# Patient Record
Sex: Male | Born: 1960 | Race: Black or African American | Hispanic: No | Smoking: Former smoker
Health system: Southern US, Community
[De-identification: ages and names within clinical notes are randomized; demographics above are authoritative.]

## PROBLEM LIST (undated history)

## (undated) DIAGNOSIS — Z950 Presence of cardiac pacemaker: Secondary | ICD-10-CM

## (undated) DIAGNOSIS — E785 Hyperlipidemia, unspecified: Secondary | ICD-10-CM

## (undated) DIAGNOSIS — I219 Acute myocardial infarction, unspecified: Secondary | ICD-10-CM

## (undated) DIAGNOSIS — F329 Major depressive disorder, single episode, unspecified: Secondary | ICD-10-CM

## (undated) DIAGNOSIS — I4891 Unspecified atrial fibrillation: Secondary | ICD-10-CM

## (undated) DIAGNOSIS — H269 Unspecified cataract: Secondary | ICD-10-CM

## (undated) DIAGNOSIS — K635 Polyp of colon: Secondary | ICD-10-CM

## (undated) DIAGNOSIS — K219 Gastro-esophageal reflux disease without esophagitis: Secondary | ICD-10-CM

## (undated) DIAGNOSIS — I5042 Chronic combined systolic (congestive) and diastolic (congestive) heart failure: Secondary | ICD-10-CM

## (undated) DIAGNOSIS — F32A Depression, unspecified: Secondary | ICD-10-CM

## (undated) DIAGNOSIS — I10 Essential (primary) hypertension: Secondary | ICD-10-CM

## (undated) DIAGNOSIS — E669 Obesity, unspecified: Secondary | ICD-10-CM

## (undated) DIAGNOSIS — D132 Benign neoplasm of duodenum: Secondary | ICD-10-CM

## (undated) DIAGNOSIS — N529 Male erectile dysfunction, unspecified: Secondary | ICD-10-CM

## (undated) DIAGNOSIS — IMO0002 Reserved for concepts with insufficient information to code with codable children: Secondary | ICD-10-CM

## (undated) DIAGNOSIS — I495 Sick sinus syndrome: Secondary | ICD-10-CM

## (undated) DIAGNOSIS — I2510 Atherosclerotic heart disease of native coronary artery without angina pectoris: Secondary | ICD-10-CM

## (undated) DIAGNOSIS — K279 Peptic ulcer, site unspecified, unspecified as acute or chronic, without hemorrhage or perforation: Secondary | ICD-10-CM

## (undated) DIAGNOSIS — K859 Acute pancreatitis without necrosis or infection, unspecified: Secondary | ICD-10-CM

## (undated) DIAGNOSIS — K449 Diaphragmatic hernia without obstruction or gangrene: Secondary | ICD-10-CM

## (undated) DIAGNOSIS — T8859XA Other complications of anesthesia, initial encounter: Secondary | ICD-10-CM

## (undated) DIAGNOSIS — F1910 Other psychoactive substance abuse, uncomplicated: Secondary | ICD-10-CM

## (undated) DIAGNOSIS — K625 Hemorrhage of anus and rectum: Secondary | ICD-10-CM

## (undated) DIAGNOSIS — G4730 Sleep apnea, unspecified: Secondary | ICD-10-CM

## (undated) DIAGNOSIS — E0590 Thyrotoxicosis, unspecified without thyrotoxic crisis or storm: Secondary | ICD-10-CM

## (undated) DIAGNOSIS — R51 Headache: Secondary | ICD-10-CM

## (undated) DIAGNOSIS — K000 Anodontia: Secondary | ICD-10-CM

## (undated) DIAGNOSIS — I2699 Other pulmonary embolism without acute cor pulmonale: Secondary | ICD-10-CM

## (undated) HISTORY — DX: Benign neoplasm of duodenum: D13.2

## (undated) HISTORY — DX: Male erectile dysfunction, unspecified: N52.9

## (undated) HISTORY — DX: Polyp of colon: K63.5

## (undated) HISTORY — DX: Acute pancreatitis without necrosis or infection, unspecified: K85.90

## (undated) HISTORY — DX: Unspecified atrial fibrillation: I48.91

## (undated) HISTORY — DX: Other pulmonary embolism without acute cor pulmonale: I26.99

## (undated) HISTORY — DX: Atherosclerotic heart disease of native coronary artery without angina pectoris: I25.10

## (undated) HISTORY — DX: Other psychoactive substance abuse, uncomplicated: F19.10

## (undated) HISTORY — DX: Unspecified cataract: H26.9

## (undated) HISTORY — DX: Diaphragmatic hernia without obstruction or gangrene: K44.9

## (undated) HISTORY — DX: Reserved for concepts with insufficient information to code with codable children: IMO0002

## (undated) HISTORY — DX: Obesity, unspecified: E66.9

## (undated) HISTORY — DX: Chronic combined systolic (congestive) and diastolic (congestive) heart failure: I50.42

## (undated) HISTORY — DX: Peptic ulcer, site unspecified, unspecified as acute or chronic, without hemorrhage or perforation: K27.9

## (undated) HISTORY — PX: CARDIAC CATHETERIZATION: SHX172

## (undated) HISTORY — DX: Thyrotoxicosis, unspecified without thyrotoxic crisis or storm: E05.90

## (undated) HISTORY — DX: Depression, unspecified: F32.A

## (undated) HISTORY — DX: Sick sinus syndrome: I49.5

## (undated) HISTORY — DX: Sleep apnea, unspecified: G47.30

## (undated) HISTORY — PX: INSERT / REPLACE / REMOVE PACEMAKER: SUR710

## (undated) HISTORY — DX: Major depressive disorder, single episode, unspecified: F32.9

## (undated) HISTORY — DX: Hyperlipidemia, unspecified: E78.5

## (undated) HISTORY — DX: Essential (primary) hypertension: I10

## (undated) HISTORY — DX: Hemorrhage of anus and rectum: K62.5

## (undated) HISTORY — DX: Gastro-esophageal reflux disease without esophagitis: K21.9

## (undated) HISTORY — PX: BACK SURGERY: SHX140

---

## 1982-10-01 HISTORY — PX: OTHER SURGICAL HISTORY: SHX169

## 1998-05-07 ENCOUNTER — Emergency Department (HOSPITAL_COMMUNITY): Admission: EM | Admit: 1998-05-07 | Discharge: 1998-05-07 | Payer: Self-pay | Admitting: Emergency Medicine

## 1998-05-08 ENCOUNTER — Emergency Department (HOSPITAL_COMMUNITY): Admission: EM | Admit: 1998-05-08 | Discharge: 1998-05-08 | Payer: Self-pay | Admitting: Emergency Medicine

## 1999-07-06 ENCOUNTER — Emergency Department (HOSPITAL_COMMUNITY): Admission: EM | Admit: 1999-07-06 | Discharge: 1999-07-06 | Payer: Self-pay | Admitting: Emergency Medicine

## 1999-07-10 ENCOUNTER — Emergency Department (HOSPITAL_COMMUNITY): Admission: EM | Admit: 1999-07-10 | Discharge: 1999-07-10 | Payer: Self-pay | Admitting: Emergency Medicine

## 1999-07-28 ENCOUNTER — Encounter: Payer: Self-pay | Admitting: Emergency Medicine

## 1999-07-28 ENCOUNTER — Emergency Department (HOSPITAL_COMMUNITY): Admission: EM | Admit: 1999-07-28 | Discharge: 1999-07-28 | Payer: Self-pay | Admitting: Emergency Medicine

## 2001-01-22 ENCOUNTER — Emergency Department (HOSPITAL_COMMUNITY): Admission: EM | Admit: 2001-01-22 | Discharge: 2001-01-22 | Payer: Self-pay | Admitting: Emergency Medicine

## 2001-04-11 ENCOUNTER — Emergency Department (HOSPITAL_COMMUNITY): Admission: EM | Admit: 2001-04-11 | Discharge: 2001-04-11 | Payer: Self-pay | Admitting: Emergency Medicine

## 2002-07-20 ENCOUNTER — Emergency Department (HOSPITAL_COMMUNITY): Admission: EM | Admit: 2002-07-20 | Discharge: 2002-07-20 | Payer: Self-pay | Admitting: Emergency Medicine

## 2002-08-07 ENCOUNTER — Encounter: Payer: Self-pay | Admitting: Emergency Medicine

## 2002-08-07 ENCOUNTER — Emergency Department (HOSPITAL_COMMUNITY): Admission: EM | Admit: 2002-08-07 | Discharge: 2002-08-07 | Payer: Self-pay | Admitting: Emergency Medicine

## 2003-04-01 DIAGNOSIS — I2699 Other pulmonary embolism without acute cor pulmonale: Secondary | ICD-10-CM

## 2003-04-01 HISTORY — DX: Other pulmonary embolism without acute cor pulmonale: I26.99

## 2003-04-18 ENCOUNTER — Inpatient Hospital Stay (HOSPITAL_COMMUNITY): Admission: EM | Admit: 2003-04-18 | Discharge: 2003-04-26 | Payer: Self-pay | Admitting: *Deleted

## 2003-04-18 ENCOUNTER — Encounter: Payer: Self-pay | Admitting: *Deleted

## 2003-04-18 ENCOUNTER — Encounter: Payer: Self-pay | Admitting: Emergency Medicine

## 2003-04-20 ENCOUNTER — Encounter: Payer: Self-pay | Admitting: *Deleted

## 2003-04-21 ENCOUNTER — Encounter: Payer: Self-pay | Admitting: *Deleted

## 2003-05-10 ENCOUNTER — Emergency Department (HOSPITAL_COMMUNITY): Admission: EM | Admit: 2003-05-10 | Discharge: 2003-05-10 | Payer: Self-pay | Admitting: Emergency Medicine

## 2003-12-09 ENCOUNTER — Emergency Department (HOSPITAL_COMMUNITY): Admission: EM | Admit: 2003-12-09 | Discharge: 2003-12-09 | Payer: Self-pay | Admitting: Emergency Medicine

## 2003-12-12 ENCOUNTER — Emergency Department (HOSPITAL_COMMUNITY): Admission: EM | Admit: 2003-12-12 | Discharge: 2003-12-12 | Payer: Self-pay | Admitting: Family Medicine

## 2003-12-15 ENCOUNTER — Emergency Department (HOSPITAL_COMMUNITY): Admission: AD | Admit: 2003-12-15 | Discharge: 2003-12-15 | Payer: Self-pay | Admitting: Internal Medicine

## 2004-01-10 ENCOUNTER — Emergency Department (HOSPITAL_COMMUNITY): Admission: EM | Admit: 2004-01-10 | Discharge: 2004-01-10 | Payer: Self-pay | Admitting: Emergency Medicine

## 2004-02-03 ENCOUNTER — Emergency Department (HOSPITAL_COMMUNITY): Admission: EM | Admit: 2004-02-03 | Discharge: 2004-02-03 | Payer: Self-pay | Admitting: Emergency Medicine

## 2004-02-08 ENCOUNTER — Inpatient Hospital Stay (HOSPITAL_BASED_OUTPATIENT_CLINIC_OR_DEPARTMENT_OTHER): Admission: RE | Admit: 2004-02-08 | Discharge: 2004-02-08 | Payer: Self-pay | Admitting: Cardiology

## 2004-08-23 ENCOUNTER — Emergency Department (HOSPITAL_COMMUNITY): Admission: EM | Admit: 2004-08-23 | Discharge: 2004-08-23 | Payer: Self-pay | Admitting: Family Medicine

## 2004-09-17 ENCOUNTER — Emergency Department (HOSPITAL_COMMUNITY): Admission: EM | Admit: 2004-09-17 | Discharge: 2004-09-17 | Payer: Self-pay | Admitting: Family Medicine

## 2004-10-23 ENCOUNTER — Emergency Department (HOSPITAL_COMMUNITY): Admission: EM | Admit: 2004-10-23 | Discharge: 2004-10-23 | Payer: Self-pay | Admitting: Family Medicine

## 2004-11-04 ENCOUNTER — Emergency Department (HOSPITAL_COMMUNITY): Admission: EM | Admit: 2004-11-04 | Discharge: 2004-11-04 | Payer: Self-pay | Admitting: Emergency Medicine

## 2004-11-04 ENCOUNTER — Emergency Department (HOSPITAL_COMMUNITY): Admission: EM | Admit: 2004-11-04 | Discharge: 2004-11-04 | Payer: Self-pay | Admitting: *Deleted

## 2005-04-24 ENCOUNTER — Emergency Department (HOSPITAL_COMMUNITY): Admission: EM | Admit: 2005-04-24 | Discharge: 2005-04-24 | Payer: Self-pay | Admitting: Emergency Medicine

## 2005-04-27 ENCOUNTER — Emergency Department (HOSPITAL_COMMUNITY): Admission: EM | Admit: 2005-04-27 | Discharge: 2005-04-27 | Payer: Self-pay | Admitting: Emergency Medicine

## 2005-06-21 ENCOUNTER — Emergency Department (HOSPITAL_COMMUNITY): Admission: EM | Admit: 2005-06-21 | Discharge: 2005-06-21 | Payer: Self-pay | Admitting: Emergency Medicine

## 2005-07-19 ENCOUNTER — Emergency Department (HOSPITAL_COMMUNITY): Admission: EM | Admit: 2005-07-19 | Discharge: 2005-07-19 | Payer: Self-pay | Admitting: Emergency Medicine

## 2005-08-05 ENCOUNTER — Ambulatory Visit: Payer: Self-pay | Admitting: Internal Medicine

## 2006-10-27 ENCOUNTER — Emergency Department (HOSPITAL_COMMUNITY): Admission: EM | Admit: 2006-10-27 | Discharge: 2006-10-27 | Payer: Self-pay | Admitting: Emergency Medicine

## 2007-07-25 ENCOUNTER — Ambulatory Visit: Payer: Self-pay | Admitting: Internal Medicine

## 2007-08-30 ENCOUNTER — Ambulatory Visit: Payer: Self-pay | Admitting: Gastroenterology

## 2007-08-30 ENCOUNTER — Ambulatory Visit: Payer: Self-pay | Admitting: Internal Medicine

## 2007-08-30 DIAGNOSIS — I480 Paroxysmal atrial fibrillation: Secondary | ICD-10-CM | POA: Insufficient documentation

## 2007-08-30 DIAGNOSIS — E785 Hyperlipidemia, unspecified: Secondary | ICD-10-CM | POA: Insufficient documentation

## 2007-08-30 DIAGNOSIS — K921 Melena: Secondary | ICD-10-CM | POA: Insufficient documentation

## 2007-08-30 DIAGNOSIS — F329 Major depressive disorder, single episode, unspecified: Secondary | ICD-10-CM

## 2007-08-30 DIAGNOSIS — K219 Gastro-esophageal reflux disease without esophagitis: Secondary | ICD-10-CM | POA: Insufficient documentation

## 2007-08-30 DIAGNOSIS — Z9189 Other specified personal risk factors, not elsewhere classified: Secondary | ICD-10-CM | POA: Insufficient documentation

## 2007-08-30 DIAGNOSIS — Z86718 Personal history of other venous thrombosis and embolism: Secondary | ICD-10-CM | POA: Insufficient documentation

## 2007-08-30 DIAGNOSIS — F1010 Alcohol abuse, uncomplicated: Secondary | ICD-10-CM | POA: Insufficient documentation

## 2007-08-30 DIAGNOSIS — F3289 Other specified depressive episodes: Secondary | ICD-10-CM | POA: Insufficient documentation

## 2007-08-30 DIAGNOSIS — E0590 Thyrotoxicosis, unspecified without thyrotoxic crisis or storm: Secondary | ICD-10-CM | POA: Insufficient documentation

## 2007-08-30 LAB — CONVERTED CEMR LAB
ALT: 32 units/L (ref 0–53)
Albumin: 3.8 g/dL (ref 3.5–5.2)
Basophils Absolute: 0 10*3/uL (ref 0.0–0.1)
Calcium: 9.2 mg/dL (ref 8.4–10.5)
Creatinine, Ser: 1.1 mg/dL (ref 0.4–1.5)
Eosinophils Relative: 3.3 % (ref 0.0–5.0)
Ferritin: 348.8 ng/mL — ABNORMAL HIGH (ref 22.0–322.0)
Folate: 6.9 ng/mL
GFR calc non Af Amer: 76 mL/min
Glucose, Bld: 97 mg/dL (ref 70–99)
Hemoglobin: 14.8 g/dL (ref 13.0–17.0)
Iron: 78 ug/dL (ref 42–165)
Lymphocytes Relative: 39.8 % (ref 12.0–46.0)
MCV: 79.9 fL (ref 78.0–100.0)
Monocytes Relative: 10.1 % (ref 3.0–11.0)
Neutro Abs: 2.8 10*3/uL (ref 1.4–7.7)
PSA: 0.23 ng/mL (ref 0.10–4.00)
Platelets: 237 10*3/uL (ref 150–400)
Potassium: 3.9 meq/L (ref 3.5–5.1)
RBC: 5.39 M/uL (ref 4.22–5.81)
Saturation Ratios: 21.4 % (ref 20.0–50.0)
Total Bilirubin: 0.7 mg/dL (ref 0.3–1.2)
Transferrin: 259.9 mg/dL (ref >=212.0)
Triglycerides: 61 mg/dL (ref 0–149)
VLDL: 12 mg/dL (ref 0–40)
WBC: 6 10*3/uL (ref 4.5–10.5)

## 2007-09-12 ENCOUNTER — Ambulatory Visit: Payer: Self-pay | Admitting: Gastroenterology

## 2007-09-29 ENCOUNTER — Emergency Department (HOSPITAL_COMMUNITY): Admission: EM | Admit: 2007-09-29 | Discharge: 2007-09-29 | Payer: Self-pay | Admitting: Family Medicine

## 2007-10-12 ENCOUNTER — Encounter: Payer: Self-pay | Admitting: Endocrinology

## 2007-11-02 ENCOUNTER — Encounter: Payer: Self-pay | Admitting: Gastroenterology

## 2007-11-02 ENCOUNTER — Ambulatory Visit: Payer: Self-pay | Admitting: Gastroenterology

## 2007-11-02 ENCOUNTER — Encounter: Payer: Self-pay | Admitting: Internal Medicine

## 2008-01-24 ENCOUNTER — Emergency Department (HOSPITAL_COMMUNITY): Admission: EM | Admit: 2008-01-24 | Discharge: 2008-01-24 | Payer: Self-pay | Admitting: Emergency Medicine

## 2008-02-08 ENCOUNTER — Ambulatory Visit: Payer: Self-pay | Admitting: Cardiology

## 2008-02-08 LAB — CONVERTED CEMR LAB
HDL: 44.6 mg/dL (ref >39.0)
TSH: 0.32 microintl units/mL — ABNORMAL LOW (ref 0.35–5.50)
Total CK: 237 units/L (ref 7–195)
Triglycerides: 71 mg/dL (ref 0–149)

## 2008-02-09 ENCOUNTER — Encounter: Payer: Self-pay | Admitting: Internal Medicine

## 2008-02-09 ENCOUNTER — Ambulatory Visit: Payer: Self-pay

## 2008-02-21 ENCOUNTER — Ambulatory Visit: Payer: Self-pay | Admitting: Internal Medicine

## 2008-03-21 ENCOUNTER — Ambulatory Visit: Payer: Self-pay | Admitting: Internal Medicine

## 2008-03-21 LAB — CONVERTED CEMR LAB
Calcium: 9.1 mg/dL (ref 8.4–10.5)
Chloride: 107 meq/L (ref 96–112)
Creatinine, Ser: 1.1 mg/dL (ref 0.4–1.5)
Glucose, Bld: 90 mg/dL (ref 70–99)
Potassium: 4.4 meq/L (ref 3.5–5.1)

## 2008-04-24 ENCOUNTER — Ambulatory Visit: Payer: Self-pay | Admitting: Internal Medicine

## 2008-05-25 ENCOUNTER — Emergency Department (HOSPITAL_COMMUNITY): Admission: EM | Admit: 2008-05-25 | Discharge: 2008-05-25 | Payer: Self-pay | Admitting: Emergency Medicine

## 2008-05-31 ENCOUNTER — Ambulatory Visit: Payer: Self-pay | Admitting: Cardiology

## 2008-05-31 HISTORY — PX: PACEMAKER INSERTION: SHX728

## 2008-06-20 ENCOUNTER — Inpatient Hospital Stay (HOSPITAL_COMMUNITY): Admission: EM | Admit: 2008-06-20 | Discharge: 2008-06-23 | Payer: Self-pay | Admitting: Emergency Medicine

## 2008-06-20 ENCOUNTER — Ambulatory Visit: Payer: Self-pay | Admitting: Cardiology

## 2008-07-04 DIAGNOSIS — Z860100 Personal history of colon polyps, unspecified: Secondary | ICD-10-CM | POA: Insufficient documentation

## 2008-07-04 DIAGNOSIS — K644 Residual hemorrhoidal skin tags: Secondary | ICD-10-CM | POA: Insufficient documentation

## 2008-07-04 DIAGNOSIS — K2100 Gastro-esophageal reflux disease with esophagitis, without bleeding: Secondary | ICD-10-CM | POA: Insufficient documentation

## 2008-07-04 DIAGNOSIS — K449 Diaphragmatic hernia without obstruction or gangrene: Secondary | ICD-10-CM | POA: Insufficient documentation

## 2008-07-05 ENCOUNTER — Ambulatory Visit: Payer: Self-pay | Admitting: Gastroenterology

## 2008-07-05 ENCOUNTER — Ambulatory Visit: Payer: Self-pay

## 2008-07-05 DIAGNOSIS — R079 Chest pain, unspecified: Secondary | ICD-10-CM | POA: Insufficient documentation

## 2008-07-05 DIAGNOSIS — I219 Acute myocardial infarction, unspecified: Secondary | ICD-10-CM | POA: Insufficient documentation

## 2008-07-13 ENCOUNTER — Ambulatory Visit: Payer: Self-pay | Admitting: Cardiology

## 2008-07-18 ENCOUNTER — Encounter: Payer: Self-pay | Admitting: Cardiology

## 2008-07-18 ENCOUNTER — Ambulatory Visit: Payer: Self-pay

## 2008-08-06 ENCOUNTER — Telehealth: Payer: Self-pay | Admitting: Gastroenterology

## 2008-08-09 ENCOUNTER — Ambulatory Visit: Payer: Self-pay | Admitting: Cardiology

## 2008-08-09 LAB — CONVERTED CEMR LAB
T3, Free: 3.9 pg/mL (ref 2.3–4.2)
TSH: 0.28 microintl units/mL — ABNORMAL LOW (ref 0.35–5.50)

## 2008-08-13 ENCOUNTER — Ambulatory Visit: Payer: Self-pay | Admitting: Internal Medicine

## 2008-08-13 DIAGNOSIS — K279 Peptic ulcer, site unspecified, unspecified as acute or chronic, without hemorrhage or perforation: Secondary | ICD-10-CM | POA: Insufficient documentation

## 2008-08-13 DIAGNOSIS — I509 Heart failure, unspecified: Secondary | ICD-10-CM | POA: Insufficient documentation

## 2008-08-13 DIAGNOSIS — Z8719 Personal history of other diseases of the digestive system: Secondary | ICD-10-CM | POA: Insufficient documentation

## 2008-08-13 DIAGNOSIS — I5020 Unspecified systolic (congestive) heart failure: Secondary | ICD-10-CM | POA: Insufficient documentation

## 2008-09-04 ENCOUNTER — Ambulatory Visit: Payer: Self-pay | Admitting: Endocrinology

## 2008-09-06 ENCOUNTER — Ambulatory Visit: Payer: Self-pay | Admitting: Internal Medicine

## 2008-09-06 ENCOUNTER — Ambulatory Visit: Payer: Self-pay | Admitting: Gastroenterology

## 2008-09-06 DIAGNOSIS — R05 Cough: Secondary | ICD-10-CM

## 2008-09-06 DIAGNOSIS — R059 Cough, unspecified: Secondary | ICD-10-CM | POA: Insufficient documentation

## 2008-09-10 ENCOUNTER — Encounter: Payer: Self-pay | Admitting: Gastroenterology

## 2008-09-18 ENCOUNTER — Ambulatory Visit: Payer: Self-pay | Admitting: Internal Medicine

## 2008-09-18 LAB — CONVERTED CEMR LAB
BUN: 14 mg/dL (ref 6–23)
CO2: 28 meq/L (ref 19–32)
Chloride: 105 meq/L (ref 96–112)
Creatinine, Ser: 1.2 mg/dL (ref 0.4–1.5)
Magnesium: 2.1 mg/dL (ref 1.5–2.5)
Potassium: 4.3 meq/L (ref 3.5–5.1)
Sodium: 139 meq/L (ref 135–145)

## 2008-09-26 ENCOUNTER — Inpatient Hospital Stay (HOSPITAL_COMMUNITY): Admission: AD | Admit: 2008-09-26 | Discharge: 2008-09-29 | Payer: Self-pay | Admitting: Internal Medicine

## 2008-09-26 ENCOUNTER — Ambulatory Visit: Payer: Self-pay | Admitting: Internal Medicine

## 2008-10-08 ENCOUNTER — Telehealth: Payer: Self-pay | Admitting: Gastroenterology

## 2008-10-10 ENCOUNTER — Encounter: Payer: Self-pay | Admitting: Internal Medicine

## 2008-10-18 ENCOUNTER — Ambulatory Visit: Payer: Self-pay | Admitting: Cardiovascular Disease

## 2008-10-18 ENCOUNTER — Emergency Department (HOSPITAL_COMMUNITY): Admission: EM | Admit: 2008-10-18 | Discharge: 2008-10-18 | Payer: Self-pay | Admitting: Emergency Medicine

## 2008-10-23 ENCOUNTER — Encounter: Payer: Self-pay | Admitting: Internal Medicine

## 2008-10-23 ENCOUNTER — Ambulatory Visit: Payer: Self-pay | Admitting: Internal Medicine

## 2008-10-25 ENCOUNTER — Ambulatory Visit: Payer: Self-pay

## 2008-10-25 ENCOUNTER — Ambulatory Visit: Payer: Self-pay | Admitting: Endocrinology

## 2008-10-25 DIAGNOSIS — M79609 Pain in unspecified limb: Secondary | ICD-10-CM | POA: Insufficient documentation

## 2008-10-25 LAB — CONVERTED CEMR LAB
Calcium: 9.4 mg/dL (ref 8.4–10.5)
Creatinine, Ser: 1 mg/dL (ref 0.4–1.5)
Free T4: 0.8 ng/dL (ref 0.6–1.6)
GFR calc Af Amer: 102 mL/min
GFR calc non Af Amer: 84 mL/min
Sodium: 140 meq/L (ref 135–145)
TSH: 0.54 microintl units/mL (ref 0.35–5.50)

## 2008-11-05 ENCOUNTER — Ambulatory Visit: Payer: Self-pay | Admitting: Internal Medicine

## 2008-11-05 DIAGNOSIS — IMO0002 Reserved for concepts with insufficient information to code with codable children: Secondary | ICD-10-CM | POA: Insufficient documentation

## 2008-11-05 LAB — CONVERTED CEMR LAB
ALT: 30 units/L (ref 0–53)
AST: 22 units/L (ref 0–37)
Bilirubin, Direct: 0.1 mg/dL (ref 0.0–0.3)
HDL: 49.3 mg/dL (ref >39.0)
Total Bilirubin: 0.6 mg/dL (ref 0.3–1.2)
Total CHOL/HDL Ratio: 3.4

## 2008-11-15 ENCOUNTER — Encounter: Payer: Self-pay | Admitting: Internal Medicine

## 2008-11-22 ENCOUNTER — Encounter: Admission: RE | Admit: 2008-11-22 | Discharge: 2008-11-22 | Payer: Self-pay | Admitting: Neurosurgery

## 2008-11-27 ENCOUNTER — Encounter: Payer: Self-pay | Admitting: Internal Medicine

## 2008-11-27 ENCOUNTER — Ambulatory Visit: Payer: Self-pay

## 2008-12-02 ENCOUNTER — Telehealth: Payer: Self-pay | Admitting: Internal Medicine

## 2008-12-05 ENCOUNTER — Encounter: Payer: Self-pay | Admitting: Internal Medicine

## 2008-12-13 ENCOUNTER — Telehealth: Payer: Self-pay | Admitting: Internal Medicine

## 2008-12-18 ENCOUNTER — Telehealth: Payer: Self-pay | Admitting: Internal Medicine

## 2008-12-19 ENCOUNTER — Encounter: Payer: Self-pay | Admitting: Internal Medicine

## 2008-12-19 ENCOUNTER — Telehealth: Payer: Self-pay | Admitting: Internal Medicine

## 2008-12-30 ENCOUNTER — Emergency Department (HOSPITAL_COMMUNITY): Admission: EM | Admit: 2008-12-30 | Discharge: 2008-12-30 | Payer: Self-pay | Admitting: Family Medicine

## 2009-01-15 ENCOUNTER — Ambulatory Visit: Payer: Self-pay | Admitting: Internal Medicine

## 2009-01-15 DIAGNOSIS — M25579 Pain in unspecified ankle and joints of unspecified foot: Secondary | ICD-10-CM | POA: Insufficient documentation

## 2009-01-16 LAB — CONVERTED CEMR LAB
BUN: 20 mg/dL (ref 6–23)
CO2: 29 meq/L (ref 19–32)
Chloride: 107 meq/L (ref 96–112)
Creatinine, Ser: 1.1 mg/dL (ref 0.4–1.5)
Free T4: 0.6 ng/dL (ref 0.6–1.6)
Potassium: 4.3 meq/L (ref 3.5–5.1)

## 2009-01-24 ENCOUNTER — Telehealth: Payer: Self-pay | Admitting: Internal Medicine

## 2009-01-24 ENCOUNTER — Encounter: Payer: Self-pay | Admitting: Internal Medicine

## 2009-01-29 ENCOUNTER — Telehealth (INDEPENDENT_AMBULATORY_CARE_PROVIDER_SITE_OTHER): Payer: Self-pay | Admitting: *Deleted

## 2009-02-19 ENCOUNTER — Encounter: Payer: Self-pay | Admitting: Internal Medicine

## 2009-02-27 ENCOUNTER — Encounter: Payer: Self-pay | Admitting: Nurse Practitioner

## 2009-02-27 ENCOUNTER — Ambulatory Visit: Payer: Self-pay | Admitting: Cardiology

## 2009-02-27 LAB — CONVERTED CEMR LAB
BUN: 18 mg/dL (ref 6–23)
CO2: 26 meq/L (ref 19–32)
Calcium: 9.3 mg/dL (ref 8.4–10.5)
Glucose, Bld: 95 mg/dL (ref 70–99)
Potassium: 4.6 meq/L (ref 3.5–5.1)
Sodium: 140 meq/L (ref 135–145)

## 2009-02-28 HISTORY — PX: LUMBAR SPINE SURGERY: SHX701

## 2009-03-11 ENCOUNTER — Telehealth: Payer: Self-pay | Admitting: Internal Medicine

## 2009-03-14 ENCOUNTER — Emergency Department (HOSPITAL_COMMUNITY): Admission: EM | Admit: 2009-03-14 | Discharge: 2009-03-14 | Payer: Self-pay | Admitting: Emergency Medicine

## 2009-03-14 ENCOUNTER — Telehealth: Payer: Self-pay | Admitting: Internal Medicine

## 2009-03-14 DIAGNOSIS — N508 Other specified disorders of male genital organs: Secondary | ICD-10-CM | POA: Insufficient documentation

## 2009-03-22 ENCOUNTER — Ambulatory Visit (HOSPITAL_COMMUNITY): Admission: RE | Admit: 2009-03-22 | Discharge: 2009-03-22 | Payer: Self-pay | Admitting: Neurosurgery

## 2009-03-29 ENCOUNTER — Emergency Department (HOSPITAL_COMMUNITY): Admission: EM | Admit: 2009-03-29 | Discharge: 2009-03-30 | Payer: Self-pay | Admitting: Emergency Medicine

## 2009-05-02 ENCOUNTER — Encounter: Admission: RE | Admit: 2009-05-02 | Discharge: 2009-07-31 | Payer: Self-pay | Admitting: Neurosurgery

## 2009-05-29 ENCOUNTER — Encounter: Payer: Self-pay | Admitting: Internal Medicine

## 2009-05-30 ENCOUNTER — Encounter (INDEPENDENT_AMBULATORY_CARE_PROVIDER_SITE_OTHER): Payer: Self-pay | Admitting: *Deleted

## 2009-05-30 ENCOUNTER — Ambulatory Visit: Payer: Self-pay | Admitting: Internal Medicine

## 2009-05-30 DIAGNOSIS — G4730 Sleep apnea, unspecified: Secondary | ICD-10-CM | POA: Insufficient documentation

## 2009-05-30 DIAGNOSIS — I5042 Chronic combined systolic (congestive) and diastolic (congestive) heart failure: Secondary | ICD-10-CM | POA: Insufficient documentation

## 2009-06-03 ENCOUNTER — Telehealth: Payer: Self-pay | Admitting: Internal Medicine

## 2009-06-07 ENCOUNTER — Telehealth (INDEPENDENT_AMBULATORY_CARE_PROVIDER_SITE_OTHER): Payer: Self-pay | Admitting: *Deleted

## 2009-06-14 ENCOUNTER — Encounter: Payer: Self-pay | Admitting: Internal Medicine

## 2009-06-18 ENCOUNTER — Ambulatory Visit: Payer: Self-pay | Admitting: Gastroenterology

## 2009-06-18 DIAGNOSIS — K5900 Constipation, unspecified: Secondary | ICD-10-CM | POA: Insufficient documentation

## 2009-06-18 DIAGNOSIS — K649 Unspecified hemorrhoids: Secondary | ICD-10-CM | POA: Insufficient documentation

## 2009-06-18 LAB — CONVERTED CEMR LAB
Basophils Absolute: 0.1 10*3/uL (ref 0.0–0.1)
Eosinophils Relative: 3.4 % (ref 0.0–5.0)
Ferritin: 280.2 ng/mL (ref 22.0–322.0)
Folate: 7.5 ng/mL
Lymphocytes Relative: 52.1 % — ABNORMAL HIGH (ref 12.0–46.0)
Monocytes Relative: 11.9 % (ref 3.0–12.0)
Neutrophils Relative %: 32.1 % — ABNORMAL LOW (ref 43.0–77.0)
Platelets: 234 10*3/uL (ref 150.0–400.0)
RDW: 16.3 % — ABNORMAL HIGH (ref 11.5–14.6)
Saturation Ratios: 24 % (ref 20.0–50.0)
Transferrin: 244.5 mg/dL (ref 212.0–360.0)
WBC: 7.5 10*3/uL (ref 4.5–10.5)

## 2009-06-26 ENCOUNTER — Encounter (INDEPENDENT_AMBULATORY_CARE_PROVIDER_SITE_OTHER): Payer: Self-pay | Admitting: *Deleted

## 2009-07-08 ENCOUNTER — Ambulatory Visit: Payer: Self-pay | Admitting: Pulmonary Disease

## 2009-07-08 DIAGNOSIS — G4733 Obstructive sleep apnea (adult) (pediatric): Secondary | ICD-10-CM | POA: Insufficient documentation

## 2009-07-09 ENCOUNTER — Encounter: Admission: RE | Admit: 2009-07-09 | Discharge: 2009-07-09 | Payer: Self-pay | Admitting: Neurosurgery

## 2009-07-11 ENCOUNTER — Telehealth: Payer: Self-pay | Admitting: Internal Medicine

## 2009-07-17 ENCOUNTER — Telehealth: Payer: Self-pay | Admitting: Internal Medicine

## 2009-07-18 ENCOUNTER — Ambulatory Visit: Payer: Self-pay | Admitting: Internal Medicine

## 2009-07-18 DIAGNOSIS — R5381 Other malaise: Secondary | ICD-10-CM | POA: Insufficient documentation

## 2009-07-18 DIAGNOSIS — R5383 Other fatigue: Secondary | ICD-10-CM | POA: Insufficient documentation

## 2009-07-18 DIAGNOSIS — N529 Male erectile dysfunction, unspecified: Secondary | ICD-10-CM | POA: Insufficient documentation

## 2009-07-19 LAB — CONVERTED CEMR LAB
Albumin: 4 g/dL (ref 3.5–5.2)
Alkaline Phosphatase: 67 units/L (ref 39–117)
Basophils Relative: 0.5 % (ref 0.0–3.0)
CO2: 31 meq/L (ref 19–32)
Calcium: 9.3 mg/dL (ref 8.4–10.5)
Chloride: 104 meq/L (ref 96–112)
Eosinophils Absolute: 0.2 10*3/uL (ref 0.0–0.7)
Folate: 9.2 ng/mL
HCT: 44.6 % (ref 39.0–52.0)
HDL: 48 mg/dL (ref >39.00)
Hemoglobin: 14.5 g/dL (ref 13.0–17.0)
Iron: 67 ug/dL (ref 42–165)
Lymphocytes Relative: 54.6 % — ABNORMAL HIGH (ref 12.0–46.0)
Lymphs Abs: 3.2 10*3/uL (ref 0.7–4.0)
MCHC: 32.5 g/dL (ref 30.0–36.0)
MCV: 84.9 fL (ref 78.0–100.0)
Neutro Abs: 1.8 10*3/uL (ref 1.4–7.7)
PSA: 0.35 ng/mL (ref 0.10–4.00)
Potassium: 4.2 meq/L (ref 3.5–5.1)
RBC: 5.25 M/uL (ref 4.22–5.81)
Sodium: 141 meq/L (ref 135–145)
Total CHOL/HDL Ratio: 3
Total Protein: 6.7 g/dL (ref 6.0–8.3)
Triglycerides: 89 mg/dL (ref 0.0–149.0)

## 2009-07-22 ENCOUNTER — Encounter: Payer: Self-pay | Admitting: Pulmonary Disease

## 2009-07-22 ENCOUNTER — Ambulatory Visit (HOSPITAL_BASED_OUTPATIENT_CLINIC_OR_DEPARTMENT_OTHER): Admission: RE | Admit: 2009-07-22 | Discharge: 2009-07-22 | Payer: Self-pay | Admitting: Pulmonary Disease

## 2009-07-30 ENCOUNTER — Ambulatory Visit: Payer: Self-pay | Admitting: Gastroenterology

## 2009-08-04 ENCOUNTER — Ambulatory Visit: Payer: Self-pay | Admitting: Pulmonary Disease

## 2009-08-05 ENCOUNTER — Telehealth (INDEPENDENT_AMBULATORY_CARE_PROVIDER_SITE_OTHER): Payer: Self-pay | Admitting: *Deleted

## 2009-08-07 ENCOUNTER — Ambulatory Visit: Payer: Self-pay | Admitting: Pulmonary Disease

## 2009-09-02 ENCOUNTER — Encounter: Payer: Self-pay | Admitting: Internal Medicine

## 2009-09-09 ENCOUNTER — Ambulatory Visit: Payer: Self-pay | Admitting: Internal Medicine

## 2009-09-10 ENCOUNTER — Ambulatory Visit: Payer: Self-pay | Admitting: Pulmonary Disease

## 2009-09-12 ENCOUNTER — Encounter: Payer: Self-pay | Admitting: Internal Medicine

## 2009-09-17 ENCOUNTER — Encounter: Payer: Self-pay | Admitting: Internal Medicine

## 2009-09-19 ENCOUNTER — Encounter: Payer: Self-pay | Admitting: Pulmonary Disease

## 2009-10-09 ENCOUNTER — Encounter: Payer: Self-pay | Admitting: Internal Medicine

## 2009-11-12 ENCOUNTER — Emergency Department (HOSPITAL_COMMUNITY): Admission: EM | Admit: 2009-11-12 | Discharge: 2009-11-12 | Payer: Self-pay | Admitting: Emergency Medicine

## 2009-11-27 ENCOUNTER — Ambulatory Visit: Payer: Self-pay | Admitting: Pulmonary Disease

## 2009-11-27 DIAGNOSIS — R0789 Other chest pain: Secondary | ICD-10-CM | POA: Insufficient documentation

## 2009-12-09 ENCOUNTER — Encounter: Payer: Self-pay | Admitting: Internal Medicine

## 2009-12-26 ENCOUNTER — Encounter: Payer: Self-pay | Admitting: Internal Medicine

## 2010-01-07 ENCOUNTER — Telehealth (INDEPENDENT_AMBULATORY_CARE_PROVIDER_SITE_OTHER): Payer: Self-pay | Admitting: *Deleted

## 2010-01-10 ENCOUNTER — Ambulatory Visit: Payer: Self-pay | Admitting: Gastroenterology

## 2010-01-10 ENCOUNTER — Encounter (INDEPENDENT_AMBULATORY_CARE_PROVIDER_SITE_OTHER): Payer: Self-pay | Admitting: *Deleted

## 2010-01-10 DIAGNOSIS — K625 Hemorrhage of anus and rectum: Secondary | ICD-10-CM | POA: Insufficient documentation

## 2010-01-10 LAB — CONVERTED CEMR LAB
Albumin: 4 g/dL (ref 3.5–5.2)
Alkaline Phosphatase: 65 units/L (ref 39–117)
Basophils Absolute: 0 10*3/uL (ref 0.0–0.1)
Bilirubin, Direct: 0.1 mg/dL (ref 0.0–0.3)
Calcium: 9.7 mg/dL (ref 8.4–10.5)
Eosinophils Absolute: 0.2 10*3/uL (ref 0.0–0.7)
Ferritin: 208.2 ng/mL (ref 22.0–322.0)
Folate: 7.8 ng/mL
GFR calc non Af Amer: 101.51 mL/min (ref >60)
HCT: 43.8 % (ref 39.0–52.0)
Lymphs Abs: 2.6 10*3/uL (ref 0.7–4.0)
MCV: 81.9 fL (ref 78.0–100.0)
Monocytes Absolute: 0.4 10*3/uL (ref 0.1–1.0)
Platelets: 223 10*3/uL (ref 150.0–400.0)
RDW: 17.2 % — ABNORMAL HIGH (ref 11.5–14.6)
Saturation Ratios: 24.5 % (ref 20.0–50.0)
Sodium: 141 meq/L (ref 135–145)
Transferrin: 262.6 mg/dL (ref 212.0–360.0)

## 2010-01-14 ENCOUNTER — Telehealth: Payer: Self-pay | Admitting: Gastroenterology

## 2010-01-15 ENCOUNTER — Telehealth (INDEPENDENT_AMBULATORY_CARE_PROVIDER_SITE_OTHER): Payer: Self-pay | Admitting: *Deleted

## 2010-01-21 ENCOUNTER — Telehealth: Payer: Self-pay | Admitting: Gastroenterology

## 2010-01-22 ENCOUNTER — Ambulatory Visit: Payer: Self-pay | Admitting: Gastroenterology

## 2010-01-29 ENCOUNTER — Telehealth: Payer: Self-pay | Admitting: Internal Medicine

## 2010-01-30 ENCOUNTER — Ambulatory Visit: Payer: Self-pay | Admitting: Internal Medicine

## 2010-02-05 ENCOUNTER — Telehealth (INDEPENDENT_AMBULATORY_CARE_PROVIDER_SITE_OTHER): Payer: Self-pay | Admitting: *Deleted

## 2010-02-05 ENCOUNTER — Ambulatory Visit: Payer: Self-pay | Admitting: Pulmonary Disease

## 2010-02-05 DIAGNOSIS — J302 Other seasonal allergic rhinitis: Secondary | ICD-10-CM | POA: Insufficient documentation

## 2010-02-05 DIAGNOSIS — J3089 Other allergic rhinitis: Secondary | ICD-10-CM

## 2010-02-06 ENCOUNTER — Encounter: Payer: Self-pay | Admitting: Cardiovascular Disease

## 2010-02-06 ENCOUNTER — Encounter (HOSPITAL_COMMUNITY): Admission: RE | Admit: 2010-02-06 | Discharge: 2010-04-01 | Payer: Self-pay | Admitting: Internal Medicine

## 2010-02-06 ENCOUNTER — Encounter: Payer: Self-pay | Admitting: Pulmonary Disease

## 2010-02-06 ENCOUNTER — Ambulatory Visit: Payer: Self-pay | Admitting: Cardiovascular Disease

## 2010-02-06 ENCOUNTER — Ambulatory Visit: Payer: Self-pay

## 2010-02-10 ENCOUNTER — Encounter: Payer: Self-pay | Admitting: Internal Medicine

## 2010-02-11 ENCOUNTER — Telehealth: Payer: Self-pay | Admitting: Internal Medicine

## 2010-02-11 ENCOUNTER — Telehealth (INDEPENDENT_AMBULATORY_CARE_PROVIDER_SITE_OTHER): Payer: Self-pay | Admitting: *Deleted

## 2010-02-12 ENCOUNTER — Encounter: Payer: Self-pay | Admitting: Internal Medicine

## 2010-03-05 ENCOUNTER — Telehealth: Payer: Self-pay | Admitting: Internal Medicine

## 2010-03-06 ENCOUNTER — Telehealth: Payer: Self-pay | Admitting: Internal Medicine

## 2010-03-12 ENCOUNTER — Telehealth: Payer: Self-pay | Admitting: Internal Medicine

## 2010-04-08 ENCOUNTER — Encounter: Payer: Self-pay | Admitting: Internal Medicine

## 2010-04-08 ENCOUNTER — Telehealth: Payer: Self-pay | Admitting: Internal Medicine

## 2010-04-08 ENCOUNTER — Telehealth (INDEPENDENT_AMBULATORY_CARE_PROVIDER_SITE_OTHER): Payer: Self-pay | Admitting: *Deleted

## 2010-04-22 ENCOUNTER — Ambulatory Visit: Payer: Self-pay | Admitting: Pulmonary Disease

## 2010-05-02 ENCOUNTER — Encounter (INDEPENDENT_AMBULATORY_CARE_PROVIDER_SITE_OTHER): Payer: Self-pay | Admitting: *Deleted

## 2010-05-06 ENCOUNTER — Ambulatory Visit: Payer: Self-pay | Admitting: Internal Medicine

## 2010-05-07 ENCOUNTER — Telehealth (INDEPENDENT_AMBULATORY_CARE_PROVIDER_SITE_OTHER): Payer: Self-pay | Admitting: *Deleted

## 2010-05-17 ENCOUNTER — Encounter: Payer: Self-pay | Admitting: Pulmonary Disease

## 2010-05-19 ENCOUNTER — Telehealth: Payer: Self-pay | Admitting: Pulmonary Disease

## 2010-05-23 ENCOUNTER — Telehealth (INDEPENDENT_AMBULATORY_CARE_PROVIDER_SITE_OTHER): Payer: Self-pay | Admitting: *Deleted

## 2010-06-02 ENCOUNTER — Telehealth: Payer: Self-pay | Admitting: Internal Medicine

## 2010-06-04 ENCOUNTER — Telehealth: Payer: Self-pay | Admitting: Internal Medicine

## 2010-06-04 ENCOUNTER — Emergency Department (HOSPITAL_COMMUNITY): Admission: EM | Admit: 2010-06-04 | Discharge: 2010-06-04 | Payer: Self-pay | Admitting: Emergency Medicine

## 2010-06-04 ENCOUNTER — Telehealth: Payer: Self-pay | Admitting: Pulmonary Disease

## 2010-06-06 ENCOUNTER — Ambulatory Visit: Payer: Self-pay | Admitting: Cardiology

## 2010-06-06 ENCOUNTER — Ambulatory Visit: Payer: Self-pay | Admitting: Internal Medicine

## 2010-06-06 DIAGNOSIS — R931 Abnormal findings on diagnostic imaging of heart and coronary circulation: Secondary | ICD-10-CM | POA: Insufficient documentation

## 2010-06-06 DIAGNOSIS — R930 Abnormal findings on diagnostic imaging of skull and head, not elsewhere classified: Secondary | ICD-10-CM | POA: Insufficient documentation

## 2010-06-06 DIAGNOSIS — R0602 Shortness of breath: Secondary | ICD-10-CM | POA: Insufficient documentation

## 2010-06-09 ENCOUNTER — Ambulatory Visit: Payer: Self-pay | Admitting: Internal Medicine

## 2010-06-16 ENCOUNTER — Ambulatory Visit: Payer: Self-pay | Admitting: Pulmonary Disease

## 2010-06-27 ENCOUNTER — Ambulatory Visit: Payer: Self-pay | Admitting: Internal Medicine

## 2010-06-27 ENCOUNTER — Encounter (INDEPENDENT_AMBULATORY_CARE_PROVIDER_SITE_OTHER): Payer: Self-pay | Admitting: *Deleted

## 2010-06-27 ENCOUNTER — Telehealth: Payer: Self-pay | Admitting: Gastroenterology

## 2010-06-27 DIAGNOSIS — Z950 Presence of cardiac pacemaker: Secondary | ICD-10-CM | POA: Insufficient documentation

## 2010-06-27 DIAGNOSIS — R1084 Generalized abdominal pain: Secondary | ICD-10-CM | POA: Insufficient documentation

## 2010-06-27 LAB — CONVERTED CEMR LAB
BUN: 16 mg/dL (ref 6–23)
Basophils Absolute: 0.1 10*3/uL (ref 0.0–0.1)
Bilirubin Urine: NEGATIVE
Chloride: 104 meq/L (ref 96–112)
Creatinine, Ser: 1 mg/dL (ref 0.4–1.5)
Eosinophils Absolute: 0.2 10*3/uL (ref 0.0–0.7)
Eosinophils Relative: 4.1 % (ref 0.0–5.0)
H Pylori IgG: POSITIVE
Ketones, ur: NEGATIVE mg/dL
Leukocytes, UA: NEGATIVE
Lipase: 47 units/L (ref 11.0–59.0)
MCV: 83.6 fL (ref 78.0–100.0)
Monocytes Absolute: 0.5 10*3/uL (ref 0.1–1.0)
Neutrophils Relative %: 42.6 % — ABNORMAL LOW (ref 43.0–77.0)
Platelets: 233 10*3/uL (ref 150.0–400.0)
RDW: 16.7 % — ABNORMAL HIGH (ref 11.5–14.6)
Specific Gravity, Urine: >=1.03 (ref 1.000–1.030)
Urobilinogen, UA: 0.2 (ref 0.0–1.0)
WBC: 5.5 10*3/uL (ref 4.5–10.5)

## 2010-07-01 ENCOUNTER — Telehealth: Payer: Self-pay | Admitting: Internal Medicine

## 2010-08-12 ENCOUNTER — Emergency Department (HOSPITAL_COMMUNITY)
Admission: EM | Admit: 2010-08-12 | Discharge: 2010-08-12 | Disposition: A | Discharge status: Other Acute Inpatient Hospital | Payer: Self-pay | Source: Home / Self Care | Admitting: Family Medicine

## 2010-08-12 ENCOUNTER — Observation Stay (HOSPITAL_COMMUNITY)
Admission: EM | Admit: 2010-08-12 | Discharge: 2010-08-13 | Payer: Self-pay | Source: Home / Self Care | Attending: Cardiology | Admitting: Cardiology

## 2010-08-14 ENCOUNTER — Ambulatory Visit: Payer: Self-pay | Admitting: Internal Medicine

## 2010-08-15 ENCOUNTER — Encounter: Payer: Self-pay | Admitting: Internal Medicine

## 2010-09-02 ENCOUNTER — Encounter
Admission: RE | Admit: 2010-09-02 | Discharge: 2010-09-02 | Payer: Self-pay | Source: Home / Self Care | Attending: Neurosurgery | Admitting: Neurosurgery

## 2010-09-16 ENCOUNTER — Encounter: Payer: Self-pay | Admitting: Endocrinology

## 2010-09-16 DIAGNOSIS — E785 Hyperlipidemia, unspecified: Secondary | ICD-10-CM | POA: Insufficient documentation

## 2010-09-19 ENCOUNTER — Ambulatory Visit (HOSPITAL_COMMUNITY)
Admission: RE | Admit: 2010-09-19 | Discharge: 2010-09-19 | Payer: Self-pay | Source: Home / Self Care | Attending: Internal Medicine | Admitting: Internal Medicine

## 2010-09-19 ENCOUNTER — Encounter: Payer: Self-pay | Admitting: Internal Medicine

## 2010-09-19 ENCOUNTER — Telehealth (INDEPENDENT_AMBULATORY_CARE_PROVIDER_SITE_OTHER): Payer: Self-pay | Admitting: *Deleted

## 2010-09-19 ENCOUNTER — Ambulatory Visit
Admission: RE | Admit: 2010-09-19 | Discharge: 2010-09-19 | Payer: Self-pay | Source: Home / Self Care | Attending: Internal Medicine | Admitting: Internal Medicine

## 2010-09-21 DIAGNOSIS — I255 Ischemic cardiomyopathy: Secondary | ICD-10-CM | POA: Insufficient documentation

## 2010-09-29 ENCOUNTER — Telehealth (INDEPENDENT_AMBULATORY_CARE_PROVIDER_SITE_OTHER): Payer: Self-pay | Admitting: *Deleted

## 2010-09-30 NOTE — Progress Notes (Signed)
Summary: myoview results  Phone Note Outgoing Call Call back at [facility] Phone [phone]   Call placed by: Gypsy Balsam RN BSN,  February 11, 2010 10:20 AM Summary of Call: Called patient and left message on machine to call about stress test results. Gypsy Balsam RN BSN  February 11, 2010 10:20 AM   Follow-up for Phone Call        Pt notified.  Pending back surgery with Dr Dutch Quint, not yet scheduled.  Will D/W results with Dr Graciela Husbands and send clearance letter if he agrees. Gypsy Balsam RN BSN  February 11, 2010 11:00 AM   Additional Follow-up for Phone Call Additional follow up Details #1::        Per Dr Graciela Husbands, patient at acceptable risk for back surgery, will send clearance note. Gypsy Balsam RN BSN  February 12, 2010 2:13 PM  Pt aware, note faxed. Gypsy Balsam RN BSN  February 12, 2010 6:01 PM

## 2010-09-30 NOTE — Letter (Signed)
Summary: [facility] Instructions  [facility]  [address] [place], [place] [address]   Phone: [phone]  Fax: [phone]       [name]    1975-01-02    MRN: [id number]        Procedure Day [name]: Wednesday, 01/22/10     Arrival Time: 2:00      Procedure Time: 3:00     Location of Procedure:                    _X _  [facility] (4th Floor)                         PREPARATION FOR COLONOSCOPY WITH MOVIPREP   Starting 5 days prior to your procedure 01/17/10 do not eat nuts, seeds, popcorn, corn, beans, peas,  salads, or any raw vegetables.  Do not take any fiber supplements (e.g. Metamucil, Citrucel, and Benefiber).  THE DAY BEFORE YOUR PROCEDURE         DATE: 01/21/10    DAY: Tuesday  1.  Drink clear liquids the entire day-NO SOLID FOOD  2.  Do not drink anything colored red or purple.  Avoid juices with pulp.  No orange juice.  3.  Drink at least 64 oz. (8 glasses) of fluid/clear liquids during the day to prevent dehydration and help the prep work efficiently.  CLEAR LIQUIDS INCLUDE: Water Jello Ice Popsicles Tea (sugar ok, no milk/cream) Powdered fruit flavored drinks Coffee (sugar ok, no milk/cream) Gatorade Juice: apple, white grape, white cranberry  Lemonade Clear bullion, consomm, broth Carbonated beverages (any kind) Strained chicken noodle soup Hard Candy                             4.  In the morning, mix first dose of MoviPrep solution:    Empty 1 Pouch A and 1 Pouch B into the disposable container    Add lukewarm drinking water to the top line of the container. Mix to dissolve    Refrigerate (mixed solution should be used within 24 hrs)  5.  Begin drinking the prep at 5:00 p.m. The MoviPrep container is divided by 4 marks.   Every 15 minutes drink the solution down to the next mark (approximately 8 oz) until the full liter is complete.   6.  Follow completed prep with 16 oz of clear liquid of your choice  (Nothing red or purple).  Continue to drink clear liquids until bedtime.  7.  Before going to bed, mix second dose of MoviPrep solution:    Empty 1 Pouch A and 1 Pouch B into the disposable container    Add lukewarm drinking water to the top line of the container. Mix to dissolve    Refrigerate  THE DAY OF YOUR PROCEDURE      DATE: 01/22/10   DAY: Wednesday  Beginning at 10:00 a.m. (5 hours before procedure):         1. Every 15 minutes, drink the solution down to the next mark (approx 8 oz) until the full liter is complete.  2. Follow completed prep with 16 oz. of clear liquid of your choice.    3. You may drink clear liquids until 1:00  (2 HOURS BEFORE PROCEDURE).   MEDICATION INSTRUCTIONS  Unless otherwise instructed, you should take regular prescription medications with a small sip of water   as early as  possible the morning of your procedure.                  OTHER INSTRUCTIONS  You will need a responsible adult at least 52 years of age to accompany you and drive you home.   This person must remain in the waiting room during your procedure.  Wear loose fitting clothing that is easily removed.  Leave jewelry and other valuables at home.  However, you may wish to bring a book to read or  an iPod/MP3 player to listen to music as you wait for your procedure to start.  Remove all body piercing jewelry and leave at home.  Total time from sign-in until discharge is approximately 2-3 hours.  You should go home directly after your procedure and rest.  You can resume normal activities the  day after your procedure.  The day of your procedure you should not:   Drive   Make legal decisions   Operate machinery   Drink alcohol   Return to work  You will receive specific instructions about eating, activities and medications before you leave.    The above instructions have been reviewed and explained to me by   _______________________    I fully  understand and can verbalize these instructions _____________________________ Date _________

## 2010-09-30 NOTE — Letter (Signed)
Summary: CMN for CPAP Supplies/[facility]  CMN for CPAP Supplies/[facility]   Imported By: Sherian Rein 02/11/2010 13:57:09  _____________________________________________________________________  External Attachment:    Type:   Image     Comment:   External Document

## 2010-09-30 NOTE — Progress Notes (Signed)
Summary: Nuclear Pre-Procedure  Phone Note Outgoing Call   Call placed by: Milana Na, EMT-P,  February 05, 2010 3:00 PM Summary of Call: Left message with information on Myoview Information Sheet (see scanned document for details).      Nuclear Med Background Indications for Stress Test: Evaluation for Ischemia, Surgical Clearance  Indications Comments: Pending Back Surgery  History: Echo, Heart Catheterization, Myocardial Infarction, Pacemaker  History Comments: '09 MI NSTEMI (+) cardiac enzymes 10/09 Heart Cath N/O CAD 10/09 Pacemaker SSS (AFib) 11/09 ECHO EF 50%  Symptoms: Chest Pain, Diaphoresis, Fatigue, Fatigue with Exertion    Nuclear Pre-Procedure Cardiac Risk Factors: Family History - CAD, History of Smoking, Hypertension, Lipids Height (in): 71  Nuclear Med Study Referring MD:  S.Klein

## 2010-09-30 NOTE — Assessment & Plan Note (Signed)
Summary: rov for osa, [place]   Copy to:  na Primary Provider/Referring Provider:  Oliver Barre, MD   CC:  , 4 week follow up  to discuss cpap pressure changes, compliant with cpap averages 6-8 hrs per night, and pt has been treated for pneumonia per Dr. Jonny Ruiz and states he hs not noticed difference since pressure was changed on cpap .  History of Present Illness: the pt comes in today for f/u of his osa.  He has been optimized to a cpap pressure of 12cm, and has been doing well on this setting.  He has no issues with mask fit or pressure, and does feel his daytime sleepiness is improved.  His main complaint today is that he is having chronic nasal congestion and stuffiness, and makes him feel bad.  He denies any cough or purulence.  He has not had purulent nasal drainage.  He did have a recent pna that was treated with abx, with radiographic improvement.    Current Medications (verified): 1)  Carvedilol 25 Mg Tabs (Carvedilol) .[name].. 1 By Mouth Two Times A Day 2)  Aspirin Ec 325 Mg Tbec (Aspirin) .[name].. 1 Tablet By Mouth Once Daily 3)  Simvastatin 80 Mg Tabs (Simvastatin) .[name].. 1 By Mouth Once Daily 4)  Methimazole 10 Mg Tabs (Methimazole) .... Qd 5)  Nitroglycerin 0.4 Mg Subl (Nitroglycerin) .[name].. 1 Tab As Needed 6)  Tikosyn 500 Mcg Caps (Dofetilide) .... Take One Capsule Two Times A Day 7)  Klor-Con M20 20 Meq Cr-Tabs (Potassium Chloride Crys Cr) .... Take 1 By Mouth Qd 8)  Omeprazole 20 Mg Cpdr (Omeprazole) .[name].. 1 By Mouth Once Daily 9)  Gabapentin 100 Mg Caps (Gabapentin) .... Take One Tablet Once Daily 10)  Propoxyphene N-Apap 100-650 Mg Tabs (Propoxyphene N-Apap) .... Take One Tablet Every 8 Hours As Needed 11)  Cozaar 50 Mg Tabs (Losartan Potassium) .... Take One Tablet By Mouth Daily 12)  Benefiber  Powd (Wheat Dextrin) .... One Scoop in Water Once A Day 13)  Levitra 20 Mg Tabs (Vardenafil Hcl) .[name].. 1 By Mouth Every Other Day As Needed 14)  Flonase 50 Mcg/act  Susp (Fluticasone Propionate) ....  Two Puffs Each Nostril Daily 15)  Hydrocodone-Acetaminophen 5-325 Mg Tabs (Hydrocodone-Acetaminophen) .[name].. 1po Q 6 Hrs As Needed Pain  Allergies (verified): No Known Drug Allergies  Past History:  Past medical, surgical, family and social histories (including risk factors) reviewed, and no changes noted (except as noted below).  Past Medical History: Reviewed history from 07/30/2009 and no changes required. Depression GERD borderline HTN obesity Coronary artery disease      a.  S/P nstemi and cath 05/2008 - nonobs. dzs. Pulmonary embolism, hx of - 8/04      a.  previously on coumadin Atrial fibrillation - paroxysmal      a.  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 Congestive heart failure      a.  EF 50% by echo in 11/09 Peptic ulcer disease Pancreatitis, hx of COUGH     - onset 12/09     - Off  ACE 09/06/2008 sleep apena  Past Surgical History: Reviewed history from 06/06/2010 and no changes required. Pacemaker placement-Medtronic Adapta, single chamber Fingers removed from right hand s/p lumbar surgury  - lumbar stenosis - Dr Dutch Quint - july 2010  Family History:  Reviewed history from 06/18/2009 and no changes required. heart disease- Mother DM Breast CA- Mother No FH of Colon Cancer: Liver Cancer: Father   Social History: Reviewed history from 09/06/2008 and no changes required. Former Smoker.  Quit in 2004.  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     Vital Signs:  Patient profile:   50 year old male Height:      71 inches Weight:      270.2 pounds BMI:     37.82 O2 Sat:      99 % on Room air Temp:     97.9 degrees F oral Pulse rate:   95 / minute BP sitting:   104 / 78  (left arm) Cuff size:   large  Vitals Entered By: Renold Genta RCP, LPN (June 16, 2010 9:08 AM)  O2 Flow:  Room air CC: ,4 week follow up  to discuss cpap pressure changes,compliant with cpap averages 6-8 hrs per night, pt has been treated for pneumonia per Dr. Jonny Ruiz and states he hs not noticed difference since pressure was changed on cpap  Comments Medications reviewed with patient Renold Genta RCP, LPN  June 16, 2010 9:08 AM    Physical Exam  General:  ow male in nad Eyes:  PERRLA and EOMI.   Nose:  large turbinates bilat, with swelling and erythema.  No purulence noted. Extremities:  no significant edema, no cyanosis  Neurologic:  alert, but mildly sleepy, moves all 4.   Impression & Recommendations:  Problem # 1:  OBSTRUCTIVE SLEEP APNEA (ICD-327.23)  the pt is wearing cpap compliantly, with no mask or pressure issues.  He feels that his daytime sleepiness is much improved.  He clearly prefers fixed pressure over his auto setting.  I have encouraged him to work aggressively on weight loss.  Problem # 2:  ALLERGIC RHINITIS CAUSE UNSPECIFIED (ICD-477.9)  the pt is c/o significant nasal congestion that has not improved with topical nasal steroids.  He has also just completed abx as well for pna.  He clearly has swollen turbinates and congestion, so will try him on astelin in the place of flonase.  If no improvement, will consider referral to ENT.    Medications Added to Medication List This Visit: 1)  Astepro 0.15 % Soln (Azelastine hcl) .... 2 each nostril 1-2 times a day.  Other Orders: Est. Patient Level IV (04540)  Patient Instructions: 1)  continue with cpap 2)  work on weight loss 3)  will try astelin in the place of flonase 2  each nostril 1-2 times a day.  If works, fill prescription.  If doesn't help, let me know and will send to ENT for evaluation. 4)  followup with me in one year.  Prescriptions: ASTEPRO 0.15 % SOLN (AZELASTINE HCL) 2 each nostril 1-2 times a day.  #1 x 6   Entered and Authorized by:   Barbaraann Share MD   Signed by:   Barbaraann Share MD on 06/16/2010   Method used:   Print then Give to Patient   RxID:   [id number]

## 2010-09-30 NOTE — Letter (Signed)
Summary: Device-Delinquent Phone Journalist, newspaper, Main Office  [address]   [place], [place] [address]   Phone: [phone]  Fax: [phone]     September 02, 2009 MRN: [id number]   JERIMY JOHANSON [address] [place], [place]  [address]   Dear Mr. Fiebig,  According to our records, you were scheduled for a device phone transmission on  August 28, 2009.     We did not receive any results from this check.  If you transmitted on your scheduled day, please call us to help troubleshoot your system.  If you forgot to send your transmission, please send one upon receipt of this letter.  Thank you,   Architectural technologist [facility]

## 2010-09-30 NOTE — Progress Notes (Signed)
Summary: speak to nurse  Phone Note Call from Patient Call back at Home Phone [phone]   Caller: Patient Call For: Jarold Motto Reason for Call: Talk to Nurse Summary of Call: Patient thinks that he messed up his prep, wants to speak to nurse, scheduled for proc tomorrow Initial call taken by: Tawni Levy,  Jan 21, 2010 1:47 PM  Follow-up for Phone Call        Pt. ate [place] sandwich at 0800 today.  Advised pt. to drink plenty of clear liquids today and I reviewed what was meant by clear liquids.  Stressed to the pt. not to eat any solid food today or tomorrow.  Pt. verbalized understanding. Follow-up by: Wyona Almas RN,  Jan 21, 2010 2:12 PM

## 2010-09-30 NOTE — Progress Notes (Signed)
Summary: chest pain  Phone Note Call from Patient Call back at Home Phone [phone]   Caller: Patient Reason for Call: Talk to Nurse Summary of Call: having chest pains, took 2 or 3 nitro on Saturday, no chest pain at this time, request appt Initial call taken by: Migdalia Dk,  January 29, 2010 4:26 PM  Follow-up for Phone Call        Spoke with pt. Patient states had severe chest pain on last Saturday. Pt. took 2 to 3 NTG SL with relief. Pt. denies chest pain at this time. He would like to make an appointment with Dr. Marikay Alar  asap. Pt has an appointment 01/30/10 at 9:45 AM pt aware. Follow-up by: Ollen Gross, RN, BSN,  January 29, 2010 4:45 PM

## 2010-09-30 NOTE — Assessment & Plan Note (Signed)
Summary: rov for osa   Copy to:  Berton Mount Primary Provider/Referring Provider:  Oliver Barre, MD   CC:  CPAP follow-up. Patient says he is less tired throughout the day. He is sleeping about 5 hours every with the CPAP.[name]  History of Present Illness: the pt comes in today for f/u of his osa.  He has been wearing cpap compliantly everynight, at least 5 hours a night.  He will awaken some nights with the mask off his face.  He thinks it is due to having something on his face, but I wonder if it is due to breakthru apnea since we have not optimized his pressure as of yet.  He is also concerned about his chronic nasal congestion, and he is known to have abnormal anatomy.  Overall, he is very pleased with the results, and feels he is sleeping much better and is more rested during the day.  Current Medications (verified): 1)  Carvedilol 25 Mg Tabs (Carvedilol) .[name].. 1 By Mouth Two Times A Day 2)  Aspirin Ec 325 Mg Tbec (Aspirin) .[name].. 1 Tablet By Mouth Once Daily 3)  Simvastatin 80 Mg Tabs (Simvastatin) .[name].. 1 By Mouth Once Daily 4)  Methimazole 10 Mg Tabs (Methimazole) .... Qd 5)  Nitroglycerin 0.4 Mg Subl (Nitroglycerin) .[name].. 1 Tab As Needed 6)  Tikosyn 125 Mcg Caps (Dofetilide) .... Take 1 Two Times A Day 7)  Klor-Con M20 20 Meq Cr-Tabs (Potassium Chloride Crys Cr) .... Take 1 By Mouth Qd 8)  Omeprazole 20 Mg Cpdr (Omeprazole) .[name].. 1 By Mouth Once Daily 9)  Gabapentin 100 Mg Caps (Gabapentin) .... Take One Tablet Once Daily 10)  Propoxyphene N-Apap 100-650 Mg Tabs (Propoxyphene N-Apap) .... Take One Tablet Every 8 Hours As Needed 11)  Cozaar 50 Mg Tabs (Losartan Potassium) .... Take One Tablet By Mouth Daily 12)  Canasa 1000 Mg  Supp (Mesalamine) .[name].. 1 Per Rectum At Bedtime 13)  Colace 100 Mg  Caps (Docusate Sodium) .[name].. 1 By Mouth Two Times A Day 14)  Benefiber  Powd (Wheat Dextrin) .... One Scoop in Water Once A Day 15)  Levitra 20 Mg Tabs (Vardenafil Hcl) .[name].. 1 By Mouth Every Other Day As  Needed 16)  Anamantle Hc 3-0.5 % Crea (Lidocaine-Hydrocortisone Ace) .... Apply To Rectam Bid  Allergies (verified): No Known Drug Allergies  Review of Systems      See HPI  Vital Signs:  Patient profile:   50 year old male Height:      71 inches (180.34 cm) Weight:      274.38 pounds (124.72 kg) BMI:     38.41 O2 Sat:      97 % on Room air Temp:     98.1 degrees F (36.72 degrees C) oral Pulse rate:   73 / minute BP sitting:   128 / 82  (right arm) Cuff size:   large  Vitals Entered By: Michel Bickers CMA (September 10, 2009 10:16 AM)  O2 Flow:  Room air  Physical Exam  General:  ow male in nad Nose:  turbinate hypertrophy with dev. to left no skin breakdown or pressure necrosis from cpap mask.   Impression & Recommendations:  Problem # 1:  OBSTRUCTIVE SLEEP APNEA (ICD-327.23)  the pt is doing fairly well with cpap, and has seen a difference in his sleep and daytime alertness.  He will sometimes pull the mask off in the middle of the night, but this is getting better.  At this point, we need to  optimize his pressure with an autoset for 2 weeks.  Will also work on nasal hygeine since he thinks this may be why he is pulling mask off.  I have stressed to him the importance of weight loss.  Time spent with pt today was .  Other Orders: Est. Patient Level III (95621)  Patient Instructions: 1)  try nasonex 2 each nostril each am everyday to see if it will help. 2)  will get your cpap optimized with the autosetting on your machine.  I will call you with results 3)  work on weight loss 4)  followup with me in 6mos.

## 2010-09-30 NOTE — Progress Notes (Signed)
Summary: pt dx with pneumonia  Phone Note Call from Patient Call back at Home Phone [phone]   Caller: Patient Reason for Call: Talk to Nurse Summary of Call: pt was advise from er md to call sk to inform him of his condition. pt has pneumonia.  Initial call taken by: Lorne Skeens,  June 04, 2010 8:56 AM  Follow-up for Phone Call        let pt know that we received this message. he states the PNA in l. lung. c/o pain when trying to take a deep breath. He is on ABX-not known which one. discussed s/e pna and pt expressed understanding. Adv pt that Dr. Graciela Husbands in surgery but should see message tonight or in am but don't anticipate further intervention. Follow-up by: Claris Gladden RN,  June 04, 2010 2:07 PM  Additional Follow-up for Phone Call Additional follow up Details #1::        rx per er md  Additional Follow-up by: Nathen May, MD, [facility],  June 09, 2010 2:50 PM

## 2010-09-30 NOTE — Letter (Signed)
Summary: CMN for CPAP Supplies/[facility]  CMN for CPAP Supplies/[facility]   Imported By: Sherian Rein 09/26/2009 07:59:38  _____________________________________________________________________  External Attachment:    Type:   Image     Comment:   External Document

## 2010-09-30 NOTE — Assessment & Plan Note (Signed)
Summary: Cardiology Nuclear Study  Nuclear Med Background Indications for Stress Test: Evaluation for Ischemia, Surgical Clearance  Indications Comments: Pending Back Surgery  History: Echo, Heart Catheterization, Myocardial Infarction, Pacemaker  History Comments: '09 MI NSTEMI (+) cardiac enzymes 10/09 Heart Cath N/O CAD 10/09 Pacemaker SSS (AFib) 11/09 ECHO EF 50%  Symptoms: Chest Pain, Diaphoresis, Fatigue, Fatigue with Exertion, Palpitations    Nuclear Pre-Procedure Cardiac Risk Factors: Family History - CAD, History of Smoking, Hypertension, Lipids Caffeine/Decaff Intake: None NPO After: 6:30 AM Lungs: clear IV 0.9% NS with Angio Cath: 20g     IV Site: (R) AC IV Started by: Irean Hong RN Chest Size (in) 52     Height (in): 71 Weight (lb): 263 BMI: 36.81 Tech Comments: Carvedilol held this am.  Nuclear Med Study 1 or 2 day study:  1 day     Stress Test Type:  Adenosine Reading MD:  Charlton Haws, MD     Referring MD:  S.Klein Resting Radionuclide:  Technetium 56m Tetrofosmin     Resting Radionuclide Dose:  10.0 mCi  Stress Radionuclide:  Technetium 55m Tetrofosmin     Stress Radionuclide Dose:  33.0 mCi   Stress Protocol  Dose of Adenosine:  60.0 mg    Stress Test Technologist:  Milana Na EMT-P     Nuclear Technologist:  Burna Mortimer Deal RT-N  Rest Procedure  Myocardial perfusion imaging was performed at rest 45 minutes following the intravenous administration of Myoview Technetium 76m Tetrofosmin.  Stress Procedure  The patient received IV adenosine at 140 mcg/kg/min for 4 minutes. There were no significant changes with infusion and had freq pvcs/pacs. Myoview was injected at the 2 minute mark and quantitative spect images were obtained after a 45 minute delay.  QPS Raw Data Images:  Normal; no motion artifact; normal heart/lung ratio. Stress Images:  NI: Uniform and normal uptake of tracer in all myocardial segments. Rest Images:  Normal homogeneous uptake  in all areas of the myocardium. Subtraction (SDS):  Normal Transient Ischemic Dilatation:  1.02  (Normal <1.22)  Lung/Heart Ratio:  .35  (Normal <0.45)  Quantitative Gated Spect Images QGS EDV:  191 ml QGS ESV:  101 ml QGS EF:  47 % QGS cine images:  Global hypokinesis worse in the septum  Findings Low risk nuclear study   Evidence for LV Dysfunction LV Dysfunction    Overall Impression  Exercise Capacity: Adenosine study with no exercise. BP Response: Normal blood pressure response. Clinical Symptoms: Dyspnea ECG Impression:  Afib Overall Impression: No perfusion defect.  Decreased LV function.  Since patient in afib at time of study consder MRI or echo to correlate EF  Appended Document: Cardiology Nuclear Study Pt notified.

## 2010-09-30 NOTE — Assessment & Plan Note (Signed)
Summary: acute sick visit for osa   Copy to:  na Primary Provider/Referring Provider:  Oliver Barre, MD   CC:  Pt is here for a sick visit.  Pt c/o having a difficult time breathing at night while wearing his cpap machine.  states he feels "choked" at night on cpap.  Pt also c/o dry mouth in the morning and feeling tired throughout the day. [name]  History of Present Illness: the pt comes in today for an acute sick visit regarding his OSA.  He has noted increasing nonrestorative sleep with daytime sleepiness the last few mos.  He also has had severe nasal congestion and dryness.  He is using his heated humidifier, but has not increased the heat to higher levels to get more moisture.  He is not using nasal ICS that I have prescribed for him in the past.  He does use a full face mask.  Recently, his machine was changed to auto in order to optimize his pressure, and we have yet received that download.  He remains on auto mode currenlty.    Current Medications (verified): 1)  Carvedilol 25 Mg Tabs (Carvedilol) .[name].. 1 By Mouth Two Times A Day 2)  Aspirin Ec 325 Mg Tbec (Aspirin) .[name].. 1 Tablet By Mouth Once Daily 3)  Simvastatin 80 Mg Tabs (Simvastatin) .[name].. 1 By Mouth Once Daily 4)  Methimazole 10 Mg Tabs (Methimazole) .... Qd 5)  Nitroglycerin 0.4 Mg Subl (Nitroglycerin) .[name].. 1 Tab As Needed 6)  Tikosyn 500 Mcg Caps (Dofetilide) .... Take One Capsule Two Times A Day 7)  Klor-Con M20 20 Meq Cr-Tabs (Potassium Chloride Crys Cr) .... Take 1 By Mouth Qd 8)  Omeprazole 20 Mg Cpdr (Omeprazole) .[name].. 1 By Mouth Once Daily 9)  Gabapentin 100 Mg Caps (Gabapentin) .... Take One Tablet Once Daily 10)  Propoxyphene N-Apap 100-650 Mg Tabs (Propoxyphene N-Apap) .... Take One Tablet Every 8 Hours As Needed 11)  Cozaar 50 Mg Tabs (Losartan Potassium) .... Take One Tablet By Mouth Daily 12)  Benefiber  Powd (Wheat Dextrin) .... One Scoop in Water Once A Day 13)  Levitra 20 Mg Tabs (Vardenafil Hcl) .[name].. 1 By Mouth Every  Other Day As Needed 14)  Saline Nasal Spray 0.65 % Soln (Saline)  Allergies (verified): No Known Drug Allergies  Past History:  Past medical, surgical, family and social histories (including risk factors) reviewed, and no changes noted (except as noted below).  Past Medical History: Reviewed history from 07/30/2009 and no changes required. Depression GERD borderline HTN obesity Coronary artery disease      a.  S/P nstemi and cath 05/2008 - nonobs. dzs. Pulmonary embolism, hx of - 8/04      a.  previously on coumadin Atrial fibrillation - paroxysmal      a.  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 Congestive heart failure      a.  EF 50% by echo in 11/09 Peptic ulcer disease Pancreatitis, hx of COUGH     - onset 12/09     - Off  ACE 09/06/2008 sleep apena  Past Surgical History: Reviewed history from 07/30/2009 and no changes required. Pacemaker placement-Medtronic Adapta, single chamber Fingers removed from right hand  Family History: Reviewed history from 06/18/2009 and no changes required. heart disease- Mother DM Breast CA- Mother No FH  of Colon Cancer: Liver Cancer: Father   Social History: Reviewed history from 09/06/2008 and no changes required. Former Smoker.  Quit in 2004.  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  The patient denies shortness of breath with activity, productive cough, coughing up blood, chest pain, irregular heartbeats, acid heartburn, indigestion, loss  of appetite, weight change, abdominal pain, tooth/dental problems, headaches, sneezing, itching, ear ache, anxiety, depression, hand/feet swelling, joint stiffness or pain, rash, change in color of mucus, and fever.    Vital Signs:  Patient profile:   50 year old male Height:      71 inches Weight:      271.38 pounds BMI:     37.99 O2 Sat:      93 % on Room air Temp:     98.0 degrees F oral Pulse rate:   69 / minute BP sitting:   100 / 60  (left arm) Cuff size:   large  Vitals Entered By: Arman Filter LPN (April 22, 2010 1:59 PM)  O2 Flow:  Room air CC: Pt is here for a sick visit.  Pt c/o having a difficult time breathing at night while wearing his cpap machine.  states he feels "choked" at night on cpap.  Pt also c/o dry mouth in the morning and feeling tired throughout the day.  Comments Medications reviewed with patient Arman Filter LPN  April 22, 2010 2:00 PM    Physical Exam  General:  ow male in nad Nose:  swollen turbinates with partial obstruction Mouth:  clear Extremities:  no edema or cyanosis  Neurologic:  alert, but appears sleepy, moves all 4.   Impression & Recommendations:  Problem # 1:  OBSTRUCTIVE SLEEP APNEA (ICD-327.23) the pt is having issues with cpap tolerance, as well as worsening daytime symptoms.  I wonder if he does not do well on the auto mode, and that he would do better on standard cpap.  Will get a download off his current setting, and change over to regular cpap mode to see if he has improvement.  I have also asked him to get back on his nasal CS.  He will f/u at his previously schedule apptm.  Medications Added to Medication List This Visit: 1)  Saline Nasal Spray 0.65 % Soln (Saline) 2)  Flonase 50 Mcg/act Susp (Fluticasone propionate) .... Two puffs each nostril daily  Other Orders: Est. Patient Level IV ([address]) DME Referral (DME)  Patient Instructions: 1)  will get a download off your machine, and then set on cpap at optimal  pressure.  Will call you once I receive results. 2)  try flonase nasal spray 2 each nostril each am.  Can use saline nasal spray during day as needed. 3)  increase heater setting on humidifier 4)  work on weight loss.  Prescriptions: FLONASE 50 MCG/ACT  SUSP (FLUTICASONE PROPIONATE) Two puffs each nostril daily  #1 x 6   Entered and Authorized by:   Barbaraann Share MD   Signed by:   Barbaraann Share MD on 04/22/2010   Method used:   Print then Give to Patient   RxID:   [id number]

## 2010-09-30 NOTE — Assessment & Plan Note (Signed)
Summary: rov for osa   Primary Provider/Referring Provider:  Oliver Barre, MD   CC:  Pt is here for a f/u appt.  Pt states he is wearing his cpap machine every night.  Pt states he was having difficulty with pressure and pulling mask off but states DME company adjusted pressure and he is no longer pulling mask off during the night. pt also c/o nasal congestion, sore throat, and ear ache and non-productive cough. .  History of Present Illness: the pt comes in today for f/u of his osa.  He never had his cpap pressure optimized, and has been having  issues with air hunger.  He recently had adjustments made to his machine, and I suspect it was changed over to auto mode...will verify this.  The pt is wearing cpap compliantly, and feels that he is definitely sleeping better and more alert during day.  He reports no issues with mask fit currently.  He is having issues with postnasal drip despite using nasonex.  He has not tried an antihistamine.  Medications Prior to Update: 1)  Carvedilol 25 Mg Tabs (Carvedilol) .[name].. 1 By Mouth Two Times A Day 2)  Aspirin Ec 325 Mg Tbec (Aspirin) .[name].. 1 Tablet By Mouth Once Daily 3)  Simvastatin 80 Mg Tabs (Simvastatin) .[name].. 1 By Mouth Once Daily 4)  Methimazole 10 Mg Tabs (Methimazole) .... Qd 5)  Nitroglycerin 0.4 Mg Subl (Nitroglycerin) .[name].. 1 Tab As Needed 6)  Tikosyn 500 Mcg Caps (Dofetilide) .... Take One Capsule Two Times A Day 7)  Klor-Con M20 20 Meq Cr-Tabs (Potassium Chloride Crys Cr) .... Take 1 By Mouth Qd 8)  Omeprazole 20 Mg Cpdr (Omeprazole) .[name].. 1 By Mouth Once Daily 9)  Gabapentin 100 Mg Caps (Gabapentin) .... Take One Tablet Once Daily 10)  Propoxyphene N-Apap 100-650 Mg Tabs (Propoxyphene N-Apap) .... Take One Tablet Every 8 Hours As Needed 11)  Cozaar 50 Mg Tabs (Losartan Potassium) .... Take One Tablet By Mouth Daily 12)  Canasa 1000 Mg  Supp (Mesalamine) .[name].. 1 Per Rectum At Bedtime 13)  Benefiber  Powd (Wheat Dextrin) .... One Scoop in Water  Once A Day 14)  Levitra 20 Mg Tabs (Vardenafil Hcl) .[name].. 1 By Mouth Every Other Day As Needed 15)  Nasonex 50 Mcg/act Susp (Mometasone Furoate) .... 2 Sprays Each Nostril Once Daily  Allergies (verified): No Known Drug Allergies  Review of Systems       The patient complains of non-productive cough, sore throat, nasal congestion/difficulty breathing through nose, and ear ache.  The patient denies shortness of breath with activity, shortness of breath at rest, productive cough, coughing up blood, chest pain, irregular heartbeats, acid heartburn, indigestion, loss of appetite, weight change, abdominal pain, difficulty swallowing, tooth/dental problems, headaches, sneezing, itching, anxiety, depression, hand/feet swelling, joint stiffness or pain, rash, change in color of mucus, and fever.    Vital Signs:  Patient profile:   50 year old male Height:      71 inches Weight:      265 pounds BMI:     37.09 O2 Sat:      95 % Temp:     97.9 degrees F oral Pulse rate:   51 / minute BP sitting:   126 / 74  (left arm) Cuff size:   large  Vitals Entered By: Arman Filter LPN (February 05, 1609 3:08 PM) CC: Pt is here for a f/u appt.  Pt states he is wearing his cpap machine every night.  Pt  states he was having difficulty with pressure and pulling mask off but states DME company adjusted pressure and he is no longer pulling mask off during the night. pt also c/o nasal congestion, sore throat, ear ache and non-productive cough.  Comments Medications reviewed with patient Arman Filter LPN  February 05, 1609 3:10 PM    Physical Exam  General:  obese male in nad Nose:  no crusting, discharge no skin breakdown or pressure necrosis from cpap mask Extremities:  no edema or cyanosis Neurologic:  alert, not sleepy, moves all 4.   Impression & Recommendations:  Problem # 1:  OBSTRUCTIVE SLEEP APNEA (ICD-327.23) the pt has seen a difference with cpap, but his pressure is clearly not at an appropriate  level.  Will therefore arrange for this.  I have again stressed to him the importance of weight loss.  Care Plan:  At this point, will arrange for the patient's machine to be changed over to auto mode for 2 weeks to optimize their pressure.  I will review the downloaded data once sent by dme, and also evaluate for compliance, leaks, and residual osa.  I will call the patient and dme to discuss the results, and have the patient's machine set appropriately.  This will serve as the pt's cpap pressure titration.  Problem # 2:  ALLERGIC RHINITIS CAUSE UNSPECIFIED (ICD-477.9) the pt is still having postnasal drip despite nasonex.  Will add zyrtec at HS to his regimen, and have also asked him to use nasal saline spray during day if needed.  Other Orders: Est. Patient Level IV (96045) DME Referral (DME)  Patient Instructions: 1)  will get reading off machine in 2 weeks and let you know the results. 2)  use nasal saline spray during day if needed to keep your nose open. 3)  try zyrtec 10mg  over the counter one at bedtime to see if helps with postnasal drip. 4)  work on weight loss. 5)  cancel upcoming apptm with me, and schedule followup apptm with me in 6mos.  Call if issues with cpap machine.

## 2010-09-30 NOTE — Assessment & Plan Note (Signed)
Summary: acute sick visit for chest pain   Copy to:  [place] Primary Provider/Referring Provider:  Oliver Barre, MD   CC:  Acute visit. The patient c/o upper back pain for 3 weeks. He denies any sob or cough. He says he felt like this the last time he had pneumonia. He has noticed some white material in his cpap and wonders if this could be related to his problem.[name]  History of Present Illness: the pt comes in today for an acute sick visit.  He is usually followed by myself for osa, and by Dr Sherene Sires for pulmonary issues (cough).  He comes in today with a 3 week h/o chest pain.  He describes this as a sharp pain that is across his back and shoulders, and goes thru to his sternum.  It is constant in nature, not worsened by exertion, and is worsened by twisting/movement of torso.  He does have a h/o PE in 2004, but denies any increased sob or LE edema.  His sats today are excellent.  He has no cough or mucus, denies congestion.  He was seen in ER 2 weeks ago, with negative evaluation, including unremarkable cxr.  Current Medications (verified): 1)  Carvedilol 25 Mg Tabs (Carvedilol) .[name].. 1 By Mouth Two Times A Day 2)  Aspirin Ec 325 Mg Tbec (Aspirin) .[name].. 1 Tablet By Mouth Once Daily 3)  Simvastatin 80 Mg Tabs (Simvastatin) .[name].. 1 By Mouth Once Daily 4)  Methimazole 10 Mg Tabs (Methimazole) .... Qd 5)  Nitroglycerin 0.4 Mg Subl (Nitroglycerin) .[name].. 1 Tab As Needed 6)  Tikosyn 500 Mcg Caps (Dofetilide) .... Take One Capsule Two Times A Day 7)  Klor-Con M20 20 Meq Cr-Tabs (Potassium Chloride Crys Cr) .... Take 1 By Mouth Qd 8)  Omeprazole 20 Mg Cpdr (Omeprazole) .[name].. 1 By Mouth Once Daily 9)  Gabapentin 100 Mg Caps (Gabapentin) .... Take One Tablet Once Daily 10)  Propoxyphene N-Apap 100-650 Mg Tabs (Propoxyphene N-Apap) .... Take One Tablet Every 8 Hours As Needed 11)  Cozaar 50 Mg Tabs (Losartan Potassium) .... Take One Tablet By Mouth Daily 12)  Canasa 1000 Mg  Supp (Mesalamine) .[name].. 1 Per Rectum  At Bedtime 13)  Colace 100 Mg  Caps (Docusate Sodium) .[name].. 1 By Mouth Two Times A Day 14)  Benefiber  Powd (Wheat Dextrin) .... One Scoop in Water Once A Day 15)  Levitra 20 Mg Tabs (Vardenafil Hcl) .[name].. 1 By Mouth Every Other Day As Needed 16)  Anamantle Hc 3-0.5 % Crea (Lidocaine-Hydrocortisone Ace) .... Apply To Rectam Bid  Allergies (verified): No Known Drug Allergies  Review of Systems       The patient complains of chest pain and irregular heartbeats.  The patient denies shortness of breath with activity, shortness of breath at rest, productive cough, non-productive cough, coughing up blood, acid heartburn, indigestion, loss of appetite, weight change, abdominal pain, difficulty swallowing, sore throat, tooth/dental problems, headaches, nasal congestion/difficulty breathing through nose, sneezing, itching, ear ache, anxiety, depression, hand/feet swelling, joint stiffness or pain, rash, change in color of mucus, and fever.    Vital Signs:  Patient profile:   50 year old male Height:      71 inches (180.34 cm) Weight:      269 pounds (122.27 kg) BMI:     37.65 O2 Sat:      96 % on Room air Temp:     98.3 degrees F (36.83 degrees C) oral Pulse rate:   87 / minute BP  sitting:   116 / 84  (left arm) Cuff size:   large  Vitals Entered By: Michel Bickers CMA (November 27, 2009 3:48 PM)  O2 Sat at Rest %:  96 O2 Flow:  Room air  Physical Exam  General:  ow male in nad  Nose:  no purulence or drainage noted. Lungs:  totally clear to auscultation Heart:  slightly irregular, CVR Extremities:  no edema or calf tenderness no cyanosis Neurologic:  alert and oriented, moves all 4.   Impression & Recommendations:  Problem # 1:  OTHER CHEST PAIN (ICD-786.59)  the pt is describing chest pain that sounds more MSK than anything else.  He has no increased sob, sats are excellent, and no LE edema or calf tenderness.  Will recheck a cxr for completeness, and treat with NSAIDS the next 5 days  to see if things improve.  If this continues, would consider a spiral ct chest to put the issue to rest.    Other Orders: Est. Patient Level IV (956[address]) T-2 View CXR (71020TC)  Patient Instructions: 1)  will check cxr today, and call you with results. 2)  trial of advil 200mg   3 tabs 3 times a day (breakfast, afternoon, bedtime) for next 5 days to see if things improve. 3)  If pain continues, let us know.  If worsens, you need to go to ER. 4)  make sure you use only distilled water in your cpap humidifier.

## 2010-09-30 NOTE — Letter (Signed)
Summary: Dermatology/[facility]  Dermatology/[facility]   Imported By: Sherian Rein 10/18/2009 14:05:02  _____________________________________________________________________  External Attachment:    Type:   Image     Comment:   External Document

## 2010-09-30 NOTE — Letter (Signed)
Summary: Device-Delinquent Phone Journalist, newspaper, Main Office  [address]   [place], [place] [address]   Phone: [phone]  Fax: [phone]     May 02, 2010 MRN: [id number]   OVERTON BOGGUS [address] [place], [place]  [address]   Dear Mr. Carlton,  According to our records, you were scheduled for a device phone transmission on  05/01/10.                            .     We did not receive any results from this check.  If you transmitted on your scheduled day, please call us to help troubleshoot your system.  If you forgot to send your transmission, please send one upon receipt of this letter.  Thank you, Altha Harm, LPN  May 02, 2010 3:48 PM   [facility]

## 2010-09-30 NOTE — Letter (Signed)
Summary: Remote Device Check  [employer], Main Office  [address]   [place], [place] [address]   Phone: [phone]  Fax: [phone]     September 12, 2009 MRN: [id number]   [name] [address] [place], [place]  [address]   Dear [name],   Your remote transmission was recieved and reviewed by your physician.  All diagnostics were within normal limits for you.  __X___Your next transmission is scheduled for:     December 11, 2009.  Please transmit at any time this day.  If you have a wireless device your transmission will be sent automatically.      Sincerely,  Proofreader

## 2010-09-30 NOTE — Progress Notes (Signed)
Summary: returning call  Phone Note Call from Patient Call back at Home Phone [phone]   Caller: Patient Reason for Call: Talk to Nurse Summary of Call: returning call Initial call taken by: Migdalia Dk,  March 06, 2010 11:50 AM  Follow-up for Phone Call       Follow-up by: Judithe Modest CMA,  March 07, 2010 4:31 PM    Follow up deferred until pt decides which mail in pharmacy he would prefer.  Until that time pt confirmed medication at home and a 3 day supply sent to [place] to get him through the rest of his vacation.

## 2010-09-30 NOTE — Letter (Signed)
Summary: Generic Letter  [facility] [employer], Main Office  [address]   [place], [place] [address]   Phone: [phone]  Fax: [phone]        April 08, 2010 MRN: [id number]    [name] [address] [place], [place]  [address]    To Whom it May Concern:  Mr Amon contacted our office today requesting information about his diagnosis and follow-up appointments.  Mr Beneke was initially seen by Dr Sherryl Manges on July 25, 2007.  This patient was last seen by Dr Graciela Husbands on January 30, 2010.  Dr Graciela Husbands plans on seeing this patient again in June of 2012.    This patient has been followed by Dr Graciela Husbands for Congestive Heart Failure, Atrial Fibrillation, Hypertensive Heart Disease, Pacemaker management, and Coronary Artery Disease.  Please contact our office at [phone] if we may be of any further assistance.    Sincerely,     Sherryl Manges, MD Julieta Gutting, RN, BSN

## 2010-09-30 NOTE — Progress Notes (Signed)
Summary: Triage  Phone Note Call from Patient Call back at Home Phone [phone]   Caller: Patient Call For: Dr. Jarold Motto Reason for Call: Talk to Nurse Summary of Call: "[place] and the whole week after eating there I have had stomach pain", tenderness. Wants to know if it could be related? Initial call taken by: Karna Christmas,  June 27, 2010 8:11 AM  Follow-up for Phone Call        Patient c/o abdominal tenderness last week.  He went to the [place] the first day it opened 2 weeks ago. He heard about the E. Coli  hospitalizations associated with the fair this morning on TV.  He had some abdominal cramping 2 days after attending the fair.[name] He reports he has a tender spot in his abdomen but overall feels better.  He denies fever, diarrhea, nausea, or vomiting.  He does have rectal bleeding, and had a previously scheduled appointment with Dr Jarold Motto for next week. He wanted to know if he needed an earier appointment.  Patient  advised that without other symptoms very unlikely to be E. Coli  related.  He will keep his appointment next week with Dr Jarold Motto for evaluation of ongoing rectal bleeding.   Follow-up by: Darcey Nora RN, CGRN,  June 27, 2010 8:34 AM  Additional Follow-up for Phone Call Additional follow up Details #1::        see me nexr week Additional Follow-up by: Mardella Layman MD [facility],  June 27, 2010 9:12 AM

## 2010-09-30 NOTE — Miscellaneous (Signed)
Summary: fixed cpap pressure is 12cm  Clinical Lists Changes  Orders: Added new Referral order of DME Referral (DME) - Signed auto data for last 2 mos...adequate compliance, optimal pressure 12cm

## 2010-09-30 NOTE — Procedures (Signed)
Summary: Colonoscopy  Patient: [name] Note: All result statuses are Final unless otherwise noted.  Tests: (1) Colonoscopy (COL)   COL Colonoscopy           DONE     [facility]     [address]     [place], [place]  [address]           COLONOSCOPY PROCEDURE REPORT           PATIENT:  [name], [name]  MR#:  [id number]     BIRTHDATE:  [birth year], 48 yrs. old  GENDER:  male     ENDOSCOPIST:  Vania Rea. Jarold Motto, MD, [facility]     REF. BY:     PROCEDURE DATE:  01/22/2010     PROCEDURE:  Surveillance Colonoscopy     ASA CLASS:  Class II     INDICATIONS:  colorectal cancer screening, average risk,     hematochezia     MEDICATIONS:   Fentanyl 50 mcg IV, Versed 6 mg IV           DESCRIPTION OF PROCEDURE:   After the risks benefits and     alternatives of the procedure were thoroughly explained, informed     consent was obtained.  Digital rectal exam was performed and     revealed no abnormalities.   The LB [id number] endoscope     was introduced through the anus and advanced to the cecum, which     was identified by both the appendix and ileocecal valve, without     limitations.  The quality of the prep was excellent, using     MoviPrep.  The instrument was then slowly withdrawn as the colon     was fully examined.     <<PROCEDUREIMAGES>>           FINDINGS:  No polyps or cancers were seen.  This was otherwise a     normal examination of the colon.  Internal hemorrhoids were found.     Retroflexed views in the rectum revealed hemorrhoids.    The scope     was then withdrawn from the patient and the procedure completed.           COMPLICATIONS:  None     ENDOSCOPIC IMPRESSION:     1) No polyps or cancers     2) Otherwise normal examination     3) Internal hemorrhoids     4) Hemorrhoids     RECOMMENDATIONS:     1) high fiber diet     2) You should continue follow current colorectal cancer     screening guidelines for "routine risk" patients with a  repeat     colonoscopy in 10 years. I do not recommend other colon cancer     screening prior to then (including stool tests for microscopic     blood) unless new symptoms arise.           REPEAT EXAM:  No           ______________________________     Vania Rea. Jarold Motto, MD, [facility]           CC:  Corwin Levins, MD           n.     Rosalie DoctorMarland Kitchen   Vania Rea. Shimeka Bacot at 01/22/2010 03:19 PM           Kris Hartmann, [id number]  Note: An exclamation mark (!) indicates a result that was not dispersed into  the flowsheet. Document Creation Date: 01/22/2010 3:20 PM _______________________________________________________________________  (1) Order result status: Final Collection or observation date-time: 01/22/2010 15:12 Requested date-time:  Receipt date-time:  Reported date-time:  Referring Physician:   Ordering Physician: Sheryn Bison [phone]) Specimen Source:  Source: Launa Grill Order Number: [id number] Lab site:   Appended Document: Colonoscopy    Clinical Lists Changes  Observations: Added new observation of COLONNXTDUE: 12/2019 (01/22/2010 15:57)

## 2010-09-30 NOTE — Progress Notes (Signed)
Summary:  letter for attorney   Phone Note Call from Patient Call back at Home Phone [phone]   Caller: Patient Reason for Call: Talk to Nurse, Talk to Doctor Summary of Call: pt going to see an attorney and needs a letter on letter head stating first visit, last visit, when is next visit, reason for treatment and he needs this today Initial call taken by: Omer Jack,  April 08, 2010 9:49 AM  Follow-up for Phone Call        I left a message for the pt to call back in regards to message.  The pt's chart will have to be pulled in order to find out the first visit date.  Last visit 01/30/10, next visit 01/2011. Chart requested. Julieta Gutting, RN, BSN  April 08, 2010 10:37 AM  I spoke with the pt and the first time that he saw Dr Graciela Husbands was 07/25/07.  I made the pt aware that if he requires documents from his chart he must sign a release of information.  The pt will come into the office today and pick-up letter.  Letter written with dates and diagnosis.    Follow-up by: Julieta Gutting, RN, BSN,  April 08, 2010 10:49 AM

## 2010-09-30 NOTE — Progress Notes (Signed)
Summary: Medication  Phone Note Call from Patient Call back at Home Phone [phone]   Caller: Patient Call For: Dr. Jarold Motto Reason for Call: Talk to Nurse Summary of Call: Analpram is too expensive...needs an alternative Initial call taken by: Karna Christmas,  Jan 14, 2010 12:48 PM  Follow-up for Phone Call        Pt states hemorrhoidal cream will cost him $100.  Req something else.  talked with pharmacist.  Medicaid will cover anusol HC only.  Insturcted pharmacist to change rx to [facility] as needed. Follow-up by: Ashok Cordia RN,  Jan 14, 2010 1:41 PM

## 2010-09-30 NOTE — Progress Notes (Signed)
Summary: sinus issues----SICK    LMTCBx1  Phone Note Call from Patient Call back at Work Phone [phone]   Caller: Patient Call For: clance Summary of Call: pt ran out of nasonex wants to know if he can use mucinex pt will be available till 9 he will be in court sinus pressure    [facility] [address]  Initial call taken by: Lacinda Axon,  Jan 07, 2010 8:37 AM  Follow-up for Phone Call        called and spoke with pt.  pt states he had nasonex samples that [facility] had given to him awhile back but has since ran out of these samples.  Pt now c/o "pressure in back of neck up through the back of his head."  Symptoms started Sunday.  Pt states occ facial/ sinus pressure.  Pt denied nasal congestion or nasal drainage.  Pt states he picked up Mucinex Nasal Spray to help with pressure but states this only helps " for a little bit."  Pt would like KC\'s recs with what he can pick up OTC to help with pressure.  will forward message to KC to address.  Megan Reynolds LPN  Jan 07, 2010 8:54 AM   Additional Follow-up for Phone Call Additional follow up Details #1::        ok to call in nasonex 2 sprays each nostril once a day  #1, 6 fills can use neilmed sinus rinses am and pm until better. can use afrin 2 sprays each nostril am and pm after rinses, for THREE DAYS ONLY. Additional Follow-up by: [name] M Clance MD,  Jan 07, 2010 11:59 AM    Additional Follow-up for Phone Call Additional follow up Details #2::    LMOMTCBX1. . Megan Reynolds LPN  Jan 07, 2010 12:04 PM   Spoke with pt and advised of the above recs per KC.  Pt verbalized understanding.  Rx for nasonex was sent to [facility] [address] Leslie Raskin  Jan 08, 2010 9:21 AM   New/Updated Medications: NASONEX 50 MCG/ACT SUSP (MOMETASONE FUROATE) 2 sprays each notril once daily Prescriptions: NASONEX 50 MCG/ACT SUSP (MOMETASONE FUROATE) 2 sprays each notril once daily  #1 x 6   Entered by:   Leslie Raskin   Authorized by:   [name] M Clance MD  Signed by:   Leslie Raskin on 01/08/2010   Method used:   Electronically to        [facility] [address] [id number]* (retail)       [address]       [place], [place]  [address]       Ph: [phone]       Fax: [phone]   RxID:   [id number]

## 2010-09-30 NOTE — Letter (Signed)
Summary: [facility]  [facility]   Imported By: Maryln Gottron 01/07/2010 14:44:14  _____________________________________________________________________  External Attachment:    Type:   Image     Comment:   External Document

## 2010-09-30 NOTE — Miscellaneous (Signed)
Summary: dx correction  Clinical Lists Changes  Problems: Changed problem from [facility] ADAPTA (ICD-V45.[name]01) to PACEMAKER, PERMANENT (ICD-V45.01)  changed the incorrect dx code to correct dx code

## 2010-09-30 NOTE — Progress Notes (Signed)
  Phone Note Other Incoming   Request: Send information Summary of Call: Records request from [facility] forwarded to [employer].     Appended Document:  Request recieved from [facility] sent to [employer].

## 2010-09-30 NOTE — Letter (Signed)
Summary: Device-Delinquent Phone Journalist, newspaper, Main Office  [address]   [place], [place] [address]   Phone: [phone]  Fax: [phone]     December 26, 2009 MRN: [id number]   [name] [address] [place], [place]  [address]   Dear Mr. Aylesworth,  According to our records, you were scheduled for a device phone transmission on 12-11-2009.     We did not receive any results from this check.  If you transmitted on your scheduled day, please call us to help troubleshoot your system.  If you forgot to send your transmission, please send one upon receipt of this letter.  Thank you,   Architectural [facility]

## 2010-09-30 NOTE — Progress Notes (Signed)
Summary: pt calling to get name of mail order pharmacy  Phone Note Call from Patient   Caller: Patient Reason for Call: Talk to Nurse, Talk to Doctor Summary of Call: pt calling, back in [place] from [place], states he was to call when he got back to find out which mail order pharmacy he can use to get his tikosyn, since [facility] the [facility]-pls call [phone] Initial call taken by: Glynda Jaeger,  March 12, 2010 3:24 PM  Follow-up for Phone Call        CALLED [facility] [place] WAS TOLD NO LONGER SERVICES PT INFORMED NEED TO CALL [facility] [place], NUMBER [phone] CALLED AND SPOKE TO PHARMACISTS TIKOSYN  # 60 X 11 REFILLS .LEFT MESSAGE FOR PT TO INFORM OF ABOVE. Follow-up by: Scherrie Bateman, LPN,  March 12, 2010 3:47 PM  Additional Follow-up for Phone Call Additional follow up Details #1::        PT AWARE. PER PT MED ORDERED. Additional Follow-up by: Scherrie Bateman, LPN,  March 13, 2010 11:06 AM

## 2010-09-30 NOTE — Cardiovascular Report (Signed)
Summary: Office Visit Remote   Office Visit Remote   Imported By: Roderic Ovens 09/12/2009 11:35:19  _____________________________________________________________________  External Attachment:    Type:   Image     Comment:   External Document

## 2010-09-30 NOTE — Progress Notes (Signed)
Summary: Change Nasonex to Fluticasone  Phone Note Other Incoming   Caller: Faxed form for PA from Dorothy on [employer] of Call: Nasonex is not a covered medication. Fluticasone is the preferred nasal spray for Medicaid. Please advise if okay to change to Fluticasone. Initial call taken by: Michel Bickers CMA,  Jan 15, 2010 4:21 PM  Follow-up for Phone Call        ok to change to generic flonase, same directions Follow-up by: Barbaraann Share MD,  Jan 16, 2010 9:14 AM  Additional Follow-up for Phone Call Additional follow up Details #1::        Patient is aware Nasonex is being changed to Fluticasone due to insurance coverage. A new RX was sent to [facility] on [address] Additional Follow-up by: Michel Bickers CMA,  Jan 16, 2010 3:51 PM    New/Updated Medications: FLUTICASONE PROPIONATE 50 MCG/ACT SUSP (FLUTICASONE PROPIONATE) 2 sprays in each nostril daily Prescriptions: FLUTICASONE PROPIONATE 50 MCG/ACT SUSP (FLUTICASONE PROPIONATE) 2 sprays in each nostril daily  #1 x 6   Entered by:   Michel Bickers CMA   Authorized by:   Barbaraann Share MD   Signed by:   Michel Bickers CMA on 01/16/2010   Method used:   Electronically to        [facility] [phone]* (retail)       [address]       [place], [place]  [address]       Ph: [phone]       Fax: [phone]   RxID:   [id number]

## 2010-09-30 NOTE — Letter (Signed)
Summary: Clearance Letter  [facility], Main Office  [address]   [place], [place] [address]   Phone: [phone]  Fax: [phone]    February 12, 2010  Re:     [name] Address:   [address]     [place], [place]  [address] DOB:     10-24-1960 MRN:     [id number]   Dear Dr. Dutch Quint,   Pt is at acceptable cardiac risk for back surgery.  Please call with questions.          Sincerely,  Gypsy Balsam RN BSN Duke Salvia, MD, [facility]

## 2010-09-30 NOTE — Progress Notes (Signed)
Summary: refill request  Phone Note Refill Request Message from:  Patient on June 02, 2010 3:04 PM  Refills Requested: Medication #1:  COZAAR 50 MG TABS Take one tablet by mouth daily [facility] [address]   Method Requested: Telephone to Pharmacy Initial call taken by: Glynda Jaeger,  June 02, 2010 3:05 PM  Follow-up for Phone Call       Follow-up by: Judithe Modest CMA,  June 02, 2010 4:16 PM    Prescriptions: COZAAR 50 MG TABS (LOSARTAN POTASSIUM) Take one tablet by mouth daily  #30 Each x 8   Entered by:   Judithe Modest CMA   Authorized by:   Nathen May, MD, [facility]   Signed by:   Judithe Modest CMA on 06/02/2010   Method used:   Electronically to        [facility] [phone]* (retail)       [address]       [place], [place]  [address]       Ph: [phone]       Fax: [phone]   RxID:   [id number]

## 2010-09-30 NOTE — Assessment & Plan Note (Signed)
Summary: ER FU ON PNEMONIA/NWS  #   Vital Signs:  Patient profile:   50 year old male Height:      71 inches Weight:      275 pounds BMI:     38.49 O2 Sat:      95 % on Room air Temp:     97.6 degrees F oral Pulse rate:   54 / minute BP sitting:   128 / 88  (left arm) Cuff size:   regular  Vitals Entered By: Alysia Penna (June 06, 2010 9:12 AM)  O2 Flow:  Room air CC: pt here for follow up visit from ER with Pneumonia. /cp sma Comments pt was given hydrocodone & an antibotic at ER visit.    Primary Care Provider:  Oliver Barre, MD   CC:  pt here for follow up visit from ER with Pneumonia. /cp sma.  History of Present Illness: here to f/u iwth acute - seen in th [facility] - oct 5, with s/s c/w pna - cxr with Left lung patchy lower pna; tx with zpack and augmentin courses , as wel as limited hydrocodone;  pain med works well for pain - per pt never had fever, cough, sob/doe ; until today having some sob with bending over to put on the shoes, and some DOE to exertion like walking in to the wating room from the parking lot;  still no cough or fever today;  actually does not feel ill - only c/o is really pain, and sob today.  Pt denies wheezing, orthopnea, pnd, worsening LE edema, palps, dizziness or syncope . Pt denies new neuro symptoms such as headache, facial or extremity weakness  No fever, wt loss, night or other sweats (except for working out in the sun yesterday), loss of appetite or other constitutional symptoms .  Tolerating current meds well, including the antibx, and good complaince.  Denies polydipsia, polyuria.  Left CP at the lower ant axillary line - worse to lie in the RIGHT side, but not the left.   Currently not on coumadin - tx only for 6 mo in 2004. Pt denies wheezing, orthopnea, pnd, worsening LE edema, palps, dizziness or syncope  Pt denies new neuro symptoms such as headache, facial or extremity weakness  Denies polydipsia or polyruia  Problems Prior to Update: 1)   Dyspnea  (ICD-786.05) 2)  Chest Pain  (ICD-786.50) 3)  Chest Xray, Abnormal  (ICD-793.1) 4)  Allergic Rhinitis Cause Unspecified  (ICD-477.9) 5)  Hemorrhage of Rectum and Anus  (ICD-569.3) 6)  Other Chest Pain  (ICD-786.59) 7)  Special Screening Malig Neoplasms Other Sites  (ICD-V76.49) 8)  Erectile Dysfunction, Organic  (ICD-607.84) 9)  Hyperlipidemia  (ICD-272.4) 10)  Fatigue  (ICD-780.79) 11)  Obstructive Sleep Apnea  (ICD-327.23) 12)  Hemorrhoids  (ICD-455.6) 13)  Constipation  (ICD-564.00) 14)  Sleep Apnea  (ICD-780.57) 15)  Pacemaker-medtronic Adapta  (ICD-V45.[name]01) 16)  Atrial Fibrillation  (ICD-427.31) 17)  Combined Heart Failure, Chronic  (ICD-428.42) 18)  Coronary Artery Disease Nstemi 2009  (ICD-414.00) 19)  AMI  (ICD-410.90) 20)  Hyperlipidemia  (ICD-272.4) 21)  Pre-operative Cardiovascular Examination  (ICD-V72.81) 22)  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) 23)  Ankle Pain, Right  (ICD-719.47) 24)  Hepatotoxicity, Drug-induced, Risk of  (ICD-V58.69) 25)  Leg Pain, Left  (ICD-729.5) 26)  Lumbar Radiculopathy, Right  (ICD-724.4) 27)  Leg Pain, Bilateral  (ICD-729.5) 28)  Cough  (ICD-786.2) 29)  Pancreatitis, Hx of  (ICD-V12.70) 30)  Peptic  Ulcer Disease  (ICD-533.90) 31)  Congestive Heart Failure  (ICD-428.0) 32)  Chest Pain  (ICD-786.50) 33)  Hiatal Hernia  (ICD-553.3) 34)  Reflux Esophagitis  (ICD-530.11) 35)  External Hemorrhoids  (ICD-455.3) 36)  Colonic Polyps, Hyperplastic, Hx of  (ICD-V12.72) 37)  Special Screening Malignant Neoplasm of Prostate  (ICD-V76.44) 38)  Hyperthyroidism  (ICD-242.90) 39)  Pulmonary Embolism, Hx of  (ICD-V12.51) 40)  Gerd  (ICD-530.81) 41)  Drug Abuse, Hx of  (ICD-V15.89) 42)  Alcohol Abuse  (ICD-305.00) 43)  Blood in Stool  (ICD-578.1) 44)  Depression  (ICD-311)  Medications Prior to Update: 1)  Carvedilol 25 Mg Tabs (Carvedilol) .[name].. 1 By Mouth Two Times A Day 2)  Aspirin Ec 325 Mg Tbec (Aspirin) .[name]..  1 Tablet By Mouth Once Daily 3)  Simvastatin 80 Mg Tabs (Simvastatin) .[name].. 1 By Mouth Once Daily 4)  Methimazole 10 Mg Tabs (Methimazole) .... Qd 5)  Nitroglycerin 0.4 Mg Subl (Nitroglycerin) .[name].. 1 Tab As Needed 6)  Tikosyn 500 Mcg Caps (Dofetilide) .... Take One Capsule Two Times A Day 7)  Klor-Con M20 20 Meq Cr-Tabs (Potassium Chloride Crys Cr) .... Take 1 By Mouth Qd 8)  Omeprazole 20 Mg Cpdr (Omeprazole) .[name].. 1 By Mouth Once Daily 9)  Gabapentin 100 Mg Caps (Gabapentin) .... Take One Tablet Once Daily 10)  Propoxyphene N-Apap 100-650 Mg Tabs (Propoxyphene N-Apap) .... Take One Tablet Every 8 Hours As Needed 11)  Cozaar 50 Mg Tabs (Losartan Potassium) .... Take One Tablet By Mouth Daily 12)  Benefiber  Powd (Wheat Dextrin) .... One Scoop in Water Once A Day 13)  Levitra 20 Mg Tabs (Vardenafil Hcl) .[name].. 1 By Mouth Every Other Day As Needed 14)  Saline Nasal Spray 0.65 % Soln (Saline) 15)  Flonase 50 Mcg/act  Susp (Fluticasone Propionate) .... Two Puffs Each Nostril Daily  Current Medications (verified): 1)  Carvedilol 25 Mg Tabs (Carvedilol) .[name].. 1 By Mouth Two Times A Day 2)  Aspirin Ec 325 Mg Tbec (Aspirin) .[name].. 1 Tablet By Mouth Once Daily 3)  Simvastatin 80 Mg Tabs (Simvastatin) .[name].. 1 By Mouth Once Daily 4)  Methimazole 10 Mg Tabs (Methimazole) .... Qd 5)  Nitroglycerin 0.4 Mg Subl (Nitroglycerin) .[name].. 1 Tab As Needed 6)  Tikosyn 500 Mcg Caps (Dofetilide) .... Take One Capsule Two Times A Day 7)  Klor-Con M20 20 Meq Cr-Tabs (Potassium Chloride Crys Cr) .... Take 1 By Mouth Qd 8)  Omeprazole 20 Mg Cpdr (Omeprazole) .[name].. 1 By Mouth Once Daily 9)  Gabapentin 100 Mg Caps (Gabapentin) .... Take One Tablet Once Daily 10)  Propoxyphene N-Apap 100-650 Mg Tabs (Propoxyphene N-Apap) .... Take One Tablet Every 8 Hours As Needed 11)  Cozaar 50 Mg Tabs (Losartan Potassium) .... Take One Tablet By Mouth Daily 12)  Benefiber  Powd (Wheat Dextrin) .... One Scoop in Water Once A Day 13)  Levitra  20 Mg Tabs (Vardenafil Hcl) .[name].. 1 By Mouth Every Other Day As Needed 14)  Saline Nasal Spray 0.65 % Soln (Saline) 15)  Flonase 50 Mcg/act  Susp (Fluticasone Propionate) .... Two Puffs Each Nostril Daily 16)  Azithromycin 250 Mg Tabs (Azithromycin) 17)  Augmentin 875-125 Mg Tabs (Amoxicillin-Pot Clavulanate) 18)  Hydrocodone-Acetaminophen 5-325 Mg Tabs (Hydrocodone-Acetaminophen) .[name].. 1po Q 6 Hrs As Needed Pain  Allergies (verified): No Known Drug Allergies  Past History:  Social History: Last updated: 09/06/2008 Former Smoker.  Quit in 2004.  Smoked for 15 years at 1 ppd Alcohol use-yes Occupation: unemployed  Air traffic controller Married/separated 7  children  Risk Factors: Alcohol Use: <1 (09/06/2008) Caffeine Use: 0 (09/06/2008)  Risk Factors: Smoking Status: quit (08/30/2007)  Past Medical History: Reviewed history from 07/30/2009 and no changes required. Depression GERD borderline HTN obesity Coronary artery disease      a.  S/P nstemi and cath 05/2008 - nonobs. dzs. Pulmonary embolism, hx of - 8/04      a.  previously on coumadin Atrial fibrillation - paroxysmal      a.  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 Congestive heart failure      a.  EF 50% by echo in 11/09 Peptic ulcer disease Pancreatitis, hx of COUGH     - onset 12/09     - Off  ACE 09/06/2008 sleep apena  Past Surgical History: Pacemaker placement-Medtronic Adapta, single chamber Fingers removed from right hand s/p lumbar surgury  - lumbar stenosis - Dr Dutch Quint - july 2010  Review of Systems       all otherwise negative per pt -  except pt reports will likely need lumbar fusion soon per Dr Poole/NS  Physical Exam  General:  alert and overweight-appearing.   Head:   normocephalic and atraumatic.   Eyes:  vision grossly intact, pupils equal, and pupils round.   Ears:  R ear normal and L ear normal.   Nose:  no external deformity and no nasal discharge.   Mouth:  no gingival abnormalities and pharynx pink and moist.   Neck:  supple and no masses.   Lungs:  normal respiratory effort and normal breath sounds, no frank rales or wheezing.   Heart:  normal rate and regular rhythm.   Abdomen:  soft, non-tender, and normal bowel sounds.   Extremities:  no edema, no erythema    Impression & Recommendations:  Problem # 1:  CHEST XRAY, ABNORMAL (ICD-793.1)  only symptoms is pain, as well mild worsening sob/doe starting today per pt ;  differential includes worsening pna, recurrent PE, sarcoid or other  - for CT angio now;  will need inpt for recurrent PE, vs possbily earlier appt with Dr Shelle Iron  than oct 17 he has planned ;  Continue all previous medications as before this visit , includiing the antibx as is  Orders: Radiology Referral (Radiology) Radiology Referral (Radiology)  Problem # 2:  CHEST PAIN (ICD-786.50)  very atypical for cardiac;  ecg in ER reviewed;  for pain med refill for now  Orders: Radiology Referral (Radiology)  Problem # 3:  DYSPNEA (ICD-786.05)  o/w as above, ? clinical significance  Orders: Radiology Referral (Radiology)  Problem # 4:  CONGESTIVE HEART FAILURE (ICD-428.0)  His updated medication list for this problem includes:    Carvedilol 25 Mg Tabs (Carvedilol) .[name]... 1 by mouth two times a day    Aspirin Ec 325 Mg Tbec (Aspirin) .[name]... 1 tablet by mouth once daily    Cozaar 50 Mg Tabs (Losartan potassium) .[name]... Take one tablet by mouth daily stable overall by hx and exam, ok to continue meds/tx as is   Complete Medication List: 1)  Carvedilol 25 Mg Tabs (Carvedilol) .[name].. 1 by mouth two times a day 2)  Aspirin Ec 325 Mg Tbec (Aspirin) .[name].. 1 tablet by mouth once daily 3)  Simvastatin 80 Mg Tabs (Simvastatin) .[name].. 1 by  mouth once daily 4)  Methimazole  10 Mg Tabs (Methimazole) .... Qd 5)  Nitroglycerin 0.4 Mg Subl (Nitroglycerin) .[name].. 1 tab as needed 6)  Tikosyn 500 Mcg Caps (Dofetilide) .... Take one capsule two times a day 7)  Klor-con M20 20 Meq Cr-tabs (Potassium chloride crys cr) .... Take 1 by mouth qd 8)  Omeprazole 20 Mg Cpdr (Omeprazole) .[name].. 1 by mouth once daily 9)  Gabapentin 100 Mg Caps (Gabapentin) .... Take one tablet once daily 10)  Propoxyphene N-apap 100-650 Mg Tabs (Propoxyphene n-apap) .... Take one tablet every 8 hours as needed 11)  Cozaar 50 Mg Tabs (Losartan potassium) .... Take one tablet by mouth daily 12)  Benefiber Powd (Wheat dextrin) .... One scoop in water once a day 13)  Levitra 20 Mg Tabs (Vardenafil hcl) .[name].. 1 by mouth every other day as needed 14)  Saline Nasal Spray 0.65 % Soln (Saline) 15)  Flonase 50 Mcg/act Susp (Fluticasone propionate) .... Two puffs each nostril daily 16)  Azithromycin 250 Mg Tabs (Azithromycin) 17)  Augmentin 875-125 Mg Tabs (Amoxicillin-pot clavulanate) 18)  Hydrocodone-acetaminophen 5-325 Mg Tabs (Hydrocodone-acetaminophen) .[name].. 1po q 6 hrs as needed pain  Patient Instructions: 1)  Please take all new medications as prescribed 2)  Continue all previous medications as before this visit, including the antibiotics as you have 3)  You will be contacted about the referral(s) to: CT scan now 4)  Please keep your appt with Dr Shelle Iron Oct 17 unless otherwise notified 5)  Please schedule a follow-up appointment in 6 months or sooner if needed Prescriptions: HYDROCODONE-ACETAMINOPHEN 5-325 MG TABS (HYDROCODONE-ACETAMINOPHEN) 1po q 6 hrs as needed pain  #40 x 1   Entered and Authorized by:   Corwin Levins MD   Signed by:   Corwin Levins MD on 06/06/2010   Method used:   Print then Give to Patient   RxID:   [id number]

## 2010-09-30 NOTE — Progress Notes (Signed)
Summary: refill request  Phone Note Refill Request Message from:  Patient on July 01, 2010 9:26 AM  Refills Requested: Medication #1:  COZAAR 50 MG TABS Take one tablet by mouth daily [facility] [address] needs auth for med   Method Requested: Telephone to Pharmacy Initial call taken by: Glynda Jaeger,  July 01, 2010 9:27 AM  Follow-up for Phone Call       Follow-up by: Judithe Modest CMA,  July 01, 2010 9:36 AM    Prescriptions: COZAAR 50 MG TABS (LOSARTAN POTASSIUM) Take one tablet by mouth daily  #90 x 3   Entered by:   Judithe Modest CMA   Authorized by:   Nathen May, MD, [facility]   Signed by:   Judithe Modest CMA on 07/01/2010   Method used:   Electronically to        [facility] [phone]* (retail)       [address]       [place], [place]  [address]       Ph: [phone]       Fax: [phone]   RxID:   [id number] COZAAR 50 MG TABS (LOSARTAN POTASSIUM) Take one tablet by mouth daily  #30 Each x 8   Entered by:   Judithe Modest CMA   Authorized by:   Nathen May, MD, [facility]   Signed by:   Judithe Modest CMA on 07/01/2010   Method used:   Electronically to        [facility] [phone]* (retail)       [address]       [place], [place]  [address]       Ph: [phone]       Fax: [phone]   RxID:   [id number]    Prior Authorization obtained for patients Cozaar until 07/01/2011// Pharmacy notified

## 2010-09-30 NOTE — Letter (Signed)
Summary: Dermatology/[facility]  Dermatology/[facility]   Imported By: Lester [place] 03/13/2010 07:23:26  _____________________________________________________________________  External Attachment:    Type:   Image     Comment:   External Document

## 2010-09-30 NOTE — Assessment & Plan Note (Signed)
Summary: pt still having pain in lungs-lb   Vital Signs:  Patient profile:   50 year old male Height:      71 inches Weight:      266.38 pounds BMI:     37.29 O2 Sat:      94 % on Room air Temp:     97.9 degrees F oral Pulse rate:   56 / minute BP sitting:   122 / 84  (left arm) Cuff size:   large  Vitals Entered By: Zella Ball Ewing CMA (AAMA) (June 09, 2010 5:02 PM)  O2 Flow:  Room air  CC: Difficulty breathing/RE   Primary Care Provider:  Oliver Barre, MD   CC:  Difficulty breathing/RE.  History of Present Illness: here to f/u recent evaluation - pt essentially no change in left chest pain, pleuritic and mild dypsnea as per last OV;  no worsening symptoms or new symptoms;  No fever, wt loss, night sweats, loss of appetite or other constitutional symptoms  Recent CT - no PE, and apparent radiographic at least resolution of prior infiltrate seen on CXR left lung;  pain well controlled with current meds, pt simply very nervous about the fact the pain persists and not seeming to improve to him very quickly.  Pt denies other CP, worsening sob or doe, wheezing, orthopnea, pnd, worsening LE edema, palps, dizziness or syncope .  Denies polydipsia, polyuria.  Pt denies new neuro symptoms such as headache, facial or extremity weakness No fever, wt loss, night sweats, loss of appetite or other constitutional symptoms  Has had ongoing stressors recent, denies worsening depressive symptoms, or suicidal ideation or panic.   Problems Prior to Update: 1)  Dyspnea  (ICD-786.05) 2)  Chest Pain  (ICD-786.50) 3)  Chest Xray, Abnormal  (ICD-793.1) 4)  Allergic Rhinitis Cause Unspecified  (ICD-477.9) 5)  Hemorrhage of Rectum and Anus  (ICD-569.3) 6)  Other Chest Pain  (ICD-786.59) 7)  Special Screening Malig Neoplasms Other Sites  (ICD-V76.49) 8)  Erectile Dysfunction, Organic  (ICD-607.84) 9)  Hyperlipidemia  (ICD-272.4) 10)  Fatigue  (ICD-780.79) 11)  Obstructive Sleep Apnea  (ICD-327.23) 12)   Hemorrhoids  (ICD-455.6) 13)  Constipation  (ICD-564.00) 14)  Sleep Apnea  (ICD-780.57) 15)  Pacemaker-medtronic Adapta  (ICD-V45.[name]01) 16)  Atrial Fibrillation  (ICD-427.31) 17)  Combined Heart Failure, Chronic  (ICD-428.42) 18)  Coronary Artery Disease Nstemi 2009  (ICD-414.00) 19)  AMI  (ICD-410.90) 20)  Hyperlipidemia  (ICD-272.4) 21)  Pre-operative Cardiovascular Examination  (ICD-V72.81) 22)  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) 23)  Ankle Pain, Right  (ICD-719.47) 24)  Hepatotoxicity, Drug-induced, Risk of  (ICD-V58.69) 25)  Leg Pain, Left  (ICD-729.5) 26)  Lumbar Radiculopathy, Right  (ICD-724.4) 27)  Leg Pain, Bilateral  (ICD-729.5) 28)  Cough  (ICD-786.2) 29)  Pancreatitis, Hx of  (ICD-V12.70) 30)  Peptic Ulcer Disease  (ICD-533.90) 31)  Congestive Heart Failure  (ICD-428.0) 32)  Chest Pain  (ICD-786.50) 33)  Hiatal Hernia  (ICD-553.3) 34)  Reflux Esophagitis  (ICD-530.11) 35)  External Hemorrhoids  (ICD-455.3) 36)  Colonic Polyps, Hyperplastic, Hx of  (ICD-V12.72) 37)  Special Screening Malignant Neoplasm of Prostate  (ICD-V76.44) 38)  Hyperthyroidism  (ICD-242.90) 39)  Pulmonary Embolism, Hx of  (ICD-V12.51) 40)  Gerd  (ICD-530.81) 41)  Drug Abuse, Hx of  (ICD-V15.89) 42)  Alcohol Abuse  (ICD-305.00) 43)  Blood in Stool  (ICD-578.1) 44)  Depression  (ICD-311)  Medications Prior to Update: 1)  Carvedilol 25 Mg Tabs (Carvedilol) .Marland KitchenMarland KitchenMarland Kitchen  1 By Mouth Two Times A Day 2)  Aspirin Ec 325 Mg Tbec (Aspirin) .[name].. 1 Tablet By Mouth Once Daily 3)  Simvastatin 80 Mg Tabs (Simvastatin) .[name].. 1 By Mouth Once Daily 4)  Methimazole 10 Mg Tabs (Methimazole) .... Qd 5)  Nitroglycerin 0.4 Mg Subl (Nitroglycerin) .[name].. 1 Tab As Needed 6)  Tikosyn 500 Mcg Caps (Dofetilide) .... Take One Capsule Two Times A Day 7)  Klor-Con M20 20 Meq Cr-Tabs (Potassium Chloride Crys Cr) .... Take 1 By Mouth Qd 8)  Omeprazole 20 Mg Cpdr (Omeprazole) .[name].. 1 By Mouth Once Daily 9)   Gabapentin 100 Mg Caps (Gabapentin) .... Take One Tablet Once Daily 10)  Propoxyphene N-Apap 100-650 Mg Tabs (Propoxyphene N-Apap) .... Take One Tablet Every 8 Hours As Needed 11)  Cozaar 50 Mg Tabs (Losartan Potassium) .... Take One Tablet By Mouth Daily 12)  Benefiber  Powd (Wheat Dextrin) .... One Scoop in Water Once A Day 13)  Levitra 20 Mg Tabs (Vardenafil Hcl) .[name].. 1 By Mouth Every Other Day As Needed 14)  Saline Nasal Spray 0.65 % Soln (Saline) 15)  Flonase 50 Mcg/act  Susp (Fluticasone Propionate) .... Two Puffs Each Nostril Daily 16)  Azithromycin 250 Mg Tabs (Azithromycin) 17)  Augmentin 875-125 Mg Tabs (Amoxicillin-Pot Clavulanate) 18)  Hydrocodone-Acetaminophen 5-325 Mg Tabs (Hydrocodone-Acetaminophen) .[name].. 1po Q 6 Hrs As Needed Pain  Current Medications (verified): 1)  Carvedilol 25 Mg Tabs (Carvedilol) .[name].. 1 By Mouth Two Times A Day 2)  Aspirin Ec 325 Mg Tbec (Aspirin) .[name].. 1 Tablet By Mouth Once Daily 3)  Simvastatin 80 Mg Tabs (Simvastatin) .[name].. 1 By Mouth Once Daily 4)  Methimazole 10 Mg Tabs (Methimazole) .... Qd 5)  Nitroglycerin 0.4 Mg Subl (Nitroglycerin) .[name].. 1 Tab As Needed 6)  Tikosyn 500 Mcg Caps (Dofetilide) .... Take One Capsule Two Times A Day 7)  Klor-Con M20 20 Meq Cr-Tabs (Potassium Chloride Crys Cr) .... Take 1 By Mouth Qd 8)  Omeprazole 20 Mg Cpdr (Omeprazole) .[name].. 1 By Mouth Once Daily 9)  Gabapentin 100 Mg Caps (Gabapentin) .... Take One Tablet Once Daily 10)  Propoxyphene N-Apap 100-650 Mg Tabs (Propoxyphene N-Apap) .... Take One Tablet Every 8 Hours As Needed 11)  Cozaar 50 Mg Tabs (Losartan Potassium) .... Take One Tablet By Mouth Daily 12)  Benefiber  Powd (Wheat Dextrin) .... One Scoop in Water Once A Day 13)  Levitra 20 Mg Tabs (Vardenafil Hcl) .[name].. 1 By Mouth Every Other Day As Needed 14)  Saline Nasal Spray 0.65 % Soln (Saline) 15)  Flonase 50 Mcg/act  Susp (Fluticasone Propionate) .... Two Puffs Each Nostril Daily 16)  Azithromycin 250 Mg Tabs  (Azithromycin) 17)  Augmentin 875-125 Mg Tabs (Amoxicillin-Pot Clavulanate) 18)  Hydrocodone-Acetaminophen 5-325 Mg Tabs (Hydrocodone-Acetaminophen) .[name].. 1po Q 6 Hrs As Needed Pain  Allergies (verified): No Known Drug Allergies  Past History:  Past Medical History: Last updated: 07/30/2009 Depression GERD borderline HTN obesity Coronary artery disease      a.  S/P nstemi and cath 05/2008 - nonobs. dzs. Pulmonary embolism, hx of - 8/04      a.  previously on coumadin Atrial fibrillation - paroxysmal      a.  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 Congestive heart failure      a.  EF 50% by echo in  11/09 Peptic ulcer disease Pancreatitis, hx of COUGH     - onset 12/09     - Off  ACE 09/06/2008 sleep apena  Past Surgical History: Last updated: 06/06/2010 Pacemaker placement-Medtronic Adapta, single chamber Fingers removed from right hand s/p lumbar surgury  - lumbar stenosis - Dr Dutch Quint - july 2010  Social History: Last updated: 09/06/2008 Former Smoker.  Quit in 2004.  Smoked for 15 years at 1 ppd Alcohol use-yes Occupation: unemployed  Air traffic controller Married/separated 7 children  Risk Factors: Alcohol Use: <1 (09/06/2008) Caffeine Use: 0 (09/06/2008)  Risk Factors: Smoking Status: quit (08/30/2007)  Review of Systems       all otherwise negative per pt -    Physical Exam  General:  alert and overweight-appearing.   Head:  normocephalic and atraumatic.   Eyes:  vision grossly intact, pupils equal, and pupils round.   Ears:  R ear normal and L ear normal.   Nose:  no external deformity and no nasal discharge.   Mouth:  no gingival abnormalities and pharynx pink and moist.   Neck:  supple and no masses.   Lungs:  normal respiratory  effort and normal breath sounds, no frank rales or wheezing.   Heart:  normal rate and regular rhythm.   Msk:  tender area to left chest costal margin approx t10 level just anterio to the ant axillary line Extremities:  no edema, no erythema  Psych:  not depressed appearing and moderately anxious.     Impression & Recommendations:  Problem # 1:  CHEST PAIN (ICD-786.50) c/w pleurisy or MSK chest wall pain likely related to recnet CAP that seems to have improved radiographically;  to finish current antibx, pt o/w educated and reassured;  recent CT reviewed with pt  Problem # 2:  COMBINED HEART FAILURE, CHRONIC (ICD-428.42)  His updated medication list for this problem includes:    Carvedilol 25 Mg Tabs (Carvedilol) .[name]... 1 by mouth two times a day    Aspirin Ec 325 Mg Tbec (Aspirin) .[name]... 1 tablet by mouth once daily    Cozaar 50 Mg Tabs (Losartan potassium) .[name]... Take one tablet by mouth daily stable overall by hx and exam, ok to continue meds/tx as is   Problem # 3:  DEPRESSION (ICD-311) denies worsening symptoms - declines further tx at this time  Complete Medication List: 1)  Carvedilol 25 Mg Tabs (Carvedilol) .[name].. 1 by mouth two times a day 2)  Aspirin Ec 325 Mg Tbec (Aspirin) .[name].. 1 tablet by mouth once daily 3)  Simvastatin 80 Mg Tabs (Simvastatin) .[name].. 1 by mouth once daily 4)  Methimazole 10 Mg Tabs (Methimazole) .... Qd 5)  Nitroglycerin 0.4 Mg Subl (Nitroglycerin) .[name].. 1 tab as needed 6)  Tikosyn 500 Mcg Caps (Dofetilide) .... Take one capsule two times a day 7)  Klor-con M20 20 Meq Cr-tabs (Potassium chloride crys cr) .... Take 1 by mouth qd 8)  Omeprazole 20 Mg Cpdr (Omeprazole) .[name].. 1 by mouth once daily 9)  Gabapentin 100 Mg Caps (Gabapentin) .... Take one tablet once daily 10)  Propoxyphene N-apap 100-650 Mg Tabs (Propoxyphene n-apap) .... Take one tablet every 8 hours as needed 11)  Cozaar 50 Mg Tabs (Losartan potassium) .... Take one tablet by mouth daily 12)   Benefiber Powd (Wheat dextrin) .... One scoop in water once a day 13)  Levitra 20 Mg Tabs (Vardenafil hcl) .[name].. 1 by mouth every other day as needed 14)  Saline Nasal Spray 0.65 % Soln (Saline)  15)  Flonase 50 Mcg/act Susp (Fluticasone propionate) .... Two puffs each nostril daily 16)  Azithromycin 250 Mg Tabs (Azithromycin) 17)  Augmentin 875-125 Mg Tabs (Amoxicillin-pot clavulanate) 18)  Hydrocodone-acetaminophen 5-325 Mg Tabs (Hydrocodone-acetaminophen) .[name].. 1po q 6 hrs as needed pain  Patient Instructions: 1)  Continue all previous medications as before this visit  2)  Please keep your appt with Dr Shelle Iron as you have planned 3)  Please schedule a follow-up appointment as needed.

## 2010-09-30 NOTE — Progress Notes (Signed)
Summary: refill meds  Phone Note Other Incoming Call back at Home Phone [phone] Call back at [phone] Message from:  Patient on March 05, 2010 2:01 PM  Refills Requested: Medication #1:  TIKOSYN 500 MCG CAPS take one capsule two times a day [facility]  Caller: Patient Reason for Call: Talk to Nurse Details for Reason: pt is out of town in [place], spoken to [employer] reprentitive.was told to faxed to any [facility] he will be able to pick up.  Initial call taken by: Lorne Skeens,  March 05, 2010 2:05 PM  Follow-up for Phone Call       Follow-up by: Judithe Modest CMA,  March 05, 2010 3:13 PM    Prescriptions: TIKOSYN 500 MCG CAPS (DOFETILIDE) take one capsule two times a day  #6 x 1   Entered by:   Judithe Modest CMA   Authorized by:   Nathen May, MD, [facility]   Signed by:   Judithe Modest CMA on 03/05/2010   Method used:   Electronically to        [facility]       [address]       [place], [place]  [address]       Ph: [phone]       Fax: [phone]   RxID:   [id number] TIKOSYN 500 MCG CAPS (DOFETILIDE) take one capsule two times a day  #180 x 1   Entered by:   Judithe Modest CMA   Authorized by:   Nathen May, MD, [facility]   Signed by:   Judithe Modest CMA on 03/05/2010   Method used:   Electronically to        [facility]       [address]       [place], [place]  [address]       Ph: [phone]       Fax: [phone]   RxID:   [id number]   Appended Document: refill meds PT has been contacted several times and the pt is not sure exactly where a pharmacy is located to fill his Rx in [place].  I called around to narrow it down and left detailed refill information for pharmacy in [place].  Awaiting call back for confirmation.  Judithe Modest, CMA

## 2010-09-30 NOTE — Progress Notes (Signed)
Summary: cpap pressure too high-LMTCB  Phone Note Call from Patient   Caller: Patient Call For: clance Summary of Call: pt says since his cpap pressure was changed last wk he has not been able to sleep. has been nodding off several times during the day. very tired. says his pressure was much better the "first time" it was set.  [phone] Initial call taken by: Tivis Ringer, CNA,  February 11, 2010 3:07 PM  Follow-up for Phone Call        [facility]. Carron Curie CMA  February 11, 2010 3:16 PM  pt states since pressure change he has has more problems with not sleeping well, feeling tired during the day and wanting to nod a lot, pt would like pressure set back the way it was  Follow-up by: Philipp Deputy CMA,  February 13, 2010 4:18 PM  Additional Follow-up for Phone Call Additional follow up Details #1::        let him know that we are trying to find his best pressure, and that he is on AUTO mode.  Will have dme make an adjustment, and should be better. Additional Follow-up by: Barbaraann Share MD,  February 14, 2010 5:17 PM    Additional Follow-up for Phone Call Additional follow up Details #2::    Spoke with pt.  Pt informed of above statement per [facility] ans aware order was sent to DME as asap.  He verbalized understanding.  Gweneth Dimitri RN  February 14, 2010 5:21 PM

## 2010-09-30 NOTE — Assessment & Plan Note (Signed)
Summary: hemorroids.Marland Kitchenem   History of Present Illness Visit Type: Follow-up Visit Primary GI MD: Sheryn Bison MD FACP FAGA Primary Provider: Oliver Barre, MD  Requesting Provider: n/a Chief Complaint: Hemorrhoids  History of Present Illness:   50 year old African American male followed for many years because of chronic constipation and recurrent hemorrhoidal bleeding. His last colonoscopy in March of 2009 showed multiple polyps which were removed. He continues with mild constipation periodic rectal bleeding.  He previously was on Coumadin because of a history of pulmonary embolization but currently is not anticoagulated. He is on multiple cardiac medications which are listed and reviewed, also daily aspirin. For his rectal bleeding from hemorrhoids he is on Colace, p.r.n. Canasa suppositories, fiber supplements, and takes daily omeprazole for acid reflux. He said several negative endoscopic exams the past, last performed in March of 2009.  He is followed closely by cardiology and has a past history of atrial fibrillation. Previous cardiac catheterization has shown normal coronary arteries. There is a past history of cocaine and alcohol abuse. He also carries a diagnosis of nonischemic cardiomyopathy. He does have a cardiac pacemaker.  He continues with a symptomatic rectal bleeding on periodic basis without abdominal rectal pain. His appetite is good and his weight is stable. He denies nausea vomiting, melena, or hepatobiliary complaints. He quit smoking 2004 but used to smoke heavily. He continues to use alcohol socially. Family history is noncontributory except for history of liver cancer in his father.   GI Review of Systems      Denies abdominal pain, acid reflux, belching, bloating, chest pain, dysphagia with liquids, dysphagia with solids, heartburn, loss of appetite, nausea, vomiting, vomiting blood, weight loss, and  weight gain.      Reports hemorrhoids.     Denies anal fissure,  black tarry stools, change in bowel habit, constipation, diarrhea, diverticulosis, fecal incontinence, heme positive stool, irritable bowel syndrome, jaundice, light color stool, liver problems, rectal bleeding, and  rectal pain.    Current Medications (verified): 1)  Carvedilol 25 Mg Tabs (Carvedilol) .[name].. 1 By Mouth Two Times A Day 2)  Aspirin Ec 325 Mg Tbec (Aspirin) .[name].. 1 Tablet By Mouth Once Daily 3)  Simvastatin 80 Mg Tabs (Simvastatin) .[name].. 1 By Mouth Once Daily 4)  Methimazole 10 Mg Tabs (Methimazole) .... Qd 5)  Nitroglycerin 0.4 Mg Subl (Nitroglycerin) .[name].. 1 Tab As Needed 6)  Tikosyn 500 Mcg Caps (Dofetilide) .... Take One Capsule Two Times A Day 7)  Klor-Con M20 20 Meq Cr-Tabs (Potassium Chloride Crys Cr) .... Take 1 By Mouth Qd 8)  Omeprazole 20 Mg Cpdr (Omeprazole) .[name].. 1 By Mouth Once Daily 9)  Gabapentin 100 Mg Caps (Gabapentin) .... Take One Tablet Once Daily 10)  Propoxyphene N-Apap 100-650 Mg Tabs (Propoxyphene N-Apap) .... Take One Tablet Every 8 Hours As Needed 11)  Cozaar 50 Mg Tabs (Losartan Potassium) .... Take One Tablet By Mouth Daily 12)  Canasa 1000 Mg  Supp (Mesalamine) .[name].. 1 Per Rectum At Bedtime 13)  Colace 100 Mg  Caps (Docusate Sodium) .[name].. 1 By Mouth Two Times A Day 14)  Benefiber  Powd (Wheat Dextrin) .... One Scoop in Water Once A Day 15)  Levitra 20 Mg Tabs (Vardenafil Hcl) .[name].. 1 By Mouth Every Other Day As Needed 16)  Nasonex 50 Mcg/act Susp (Mometasone Furoate) .... 2 Sprays Each Notril Once Daily  Allergies (verified): No Known Drug Allergies  Past History:  Past medical, surgical, family and social histories (including risk factors) reviewed for relevance to  current acute and chronic problems.  Past Medical History: Reviewed history from 07/30/2009 and no changes required. Depression GERD borderline HTN obesity Coronary artery disease      a.  S/P nstemi and cath 05/2008 - nonobs. dzs. Pulmonary embolism, hx of - 8/04      a.  previously  on coumadin Atrial fibrillation - paroxysmal      a.  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 Congestive heart failure      a.  EF 50% by echo in 11/09 Peptic ulcer disease Pancreatitis, hx of COUGH     - onset 12/09     - Off  ACE 09/06/2008 sleep apena  Past Surgical History: Reviewed history from 07/30/2009 and no changes required. Pacemaker placement-Medtronic Adapta, single chamber Fingers removed from right hand  Family History: Reviewed history from 06/18/2009 and no changes required. heart disease- Mother DM Breast CA- Mother No FH of Colon Cancer: Liver Cancer: Father   Social History: Reviewed history from 09/06/2008 and no changes required. Former Smoker.  Quit in 2004.  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   General:  Denies fever, chills, sweats, anorexia, fatigue, weakness, malaise, weight loss, and sleep disorder. CV:  Complains of palpitations; denies chest pains, angina, syncope, dyspnea on exertion, orthopnea, PND, peripheral edema, and claudication. Resp:  Denies dyspnea at rest,  dyspnea with exercise, cough, sputum, wheezing, coughing up blood, and pleurisy. GI:  Denies difficulty swallowing, pain on swallowing, nausea, indigestion/heartburn, vomiting, vomiting blood, abdominal pain, jaundice, gas/bloating, diarrhea, constipation, change in bowel habits, bloody BM's, black BMs, and fecal incontinence. GU:  Denies urinary burning, blood in urine, urinary frequency, urinary hesitancy, nocturnal urination, urinary incontinence, penile discharge, genital sores, decreased libido, and erectile dysfunction. Derm:  Denies rash, itching, dry skin, hives, moles, warts, and unhealing ulcers; Apparently is being evaluated for lichen planus at [facility] in [place].[name] Neuro:  Denies weakness, paralysis, abnormal sensation, seizures, syncope, tremors, vertigo, transient blindness, frequent falls, frequent headaches, difficulty walking, headache, sciatica, radiculopathy other:, restless legs, memory loss, and confusion. Psych:  Denies depression, anxiety, memory loss, suicidal ideation, hallucinations, paranoia, phobia, and confusion. Endo:  Denies cold intolerance, heat intolerance, polydipsia, polyphagia, polyuria, unusual weight change, and hirsutism. Heme:  Denies bruising, bleeding, enlarged lymph nodes, and pagophagia.  Vital Signs:  Patient profile:   50 year old male Height:      71 inches Weight:      268 pounds BMI:     37.51 BSA:     2.39 Pulse rate:   88 / minute Pulse rhythm:   regular BP sitting:   134 / 82  (left arm) Cuff size:   regular  Vitals Entered By: Ok Anis CMA (Jan 10, 2010 10:57 AM)  Physical Exam  General:  Well developed, well nourished, no  acute distress.healthy appearing.   Head:  Normocephalic and atraumatic. Eyes:  PERRLA, no icterus. Abdomen:  Soft, nontender and nondistended. No masses, hepatosplenomegaly or hernias noted. Normal bowel sounds. Rectal:  Normal exam.No obvious fissures or fistulae or other lesions noted. Rectal  exam showed no masses or tenderness, and stool guaiac negative. Prostate:  .normal size prostate.   Extremities:  No clubbing, cyanosis, edema or deformities noted. Neurologic:  Alert and  oriented x4;  grossly normal neurologically. Psych:  Alert and cooperative. Normal mood and affect.   Impression & Recommendations:  Problem # 1:  HEMORRHAGE OF RECTUM AND ANUS (ICD-569.3) Assessment Improved Anal Mantle lidocaine-steroid cream Advanced Kit prescribed with anal wipes, cream, and oral medicationhand. Pertinent labs are been ordered,and followup colonoscopy scheduled. He is to continue fiber as tolerated liberal p.o. fluids and daily Benefiber. Orders: TLB-CBC Platelet - w/Differential (85025-CBCD) TLB-BMP (Basic Metabolic Panel-BMET) (80048-METABOL) TLB-Hepatic/Liver Function Pnl (80076-HEPATIC) TLB-TSH (Thyroid Stimulating Hormone) (84443-TSH) TLB-B12, Serum-Total ONLY (16109-U04) TLB-Ferritin (82728-FER) TLB-Folic Acid (Folate) (82746-FOL) TLB-IBC Pnl (Iron/FE;Transferrin) (83550-IBC) TLB-Magnesium (Mg) (83735-MG)  Problem # 2:  HEMORRHOIDS (ICD-455.6) Assessment: Unchanged per above. Orders: TLB-CBC Platelet - w/Differential (85025-CBCD) TLB-BMP (Basic Metabolic Panel-BMET) (80048-METABOL) TLB-Hepatic/Liver Function Pnl (80076-HEPATIC) TLB-TSH (Thyroid Stimulating Hormone) (84443-TSH) TLB-B12, Serum-Total ONLY (54098-J19) TLB-Ferritin (82728-FER) TLB-Folic Acid (Folate) (82746-FOL) TLB-IBC Pnl (Iron/FE;Transferrin) (83550-IBC) TLB-Magnesium (Mg) (83735-MG)  Problem # 3:  PACEMAKER-MEDTRONIC ADAPTA (ICD-V45.[name]01) Assessment: Comment Only  Problem # 4:  ATRIAL FIBRILLATION (ICD-427.31) Assessment: Improved The Patient is not on Coumadin but is on aspirin which we will continue for his procedure.  Problem # 5:  OBSTRUCTIVE SLEEP APNEA (ICD-327.23) Assessment: Comment Only  Problem # 6:  COMBINED HEART FAILURE, CHRONIC (ICD-428.42) Assessment: Improved Continue  multiple cardiac medications per cardiology and Dr. Oliver Barre  Patient Instructions: 1)  Please go to the basement for lab work. 2)  You are scheduled for a colonoscopy. 3)  A prescription will be sent to your pharmacy for your hemorrhoids. 4)  Hemorrhoids brochure given.  5)  The medication list was reviewed and reconciled.  All changed / newly prescribed medications were explained.  A complete medication list was provided to the patient / caregiver. 6)  Copy sent to : Dr. Oliver Barre and Dr. Hurman Horn in cardiology 7)  Please continue current medications.  8)  Constipation and Hemorrhoids brochure given.  9)  Colonoscopy and Flexible Sigmoidoscopy brochure given.  10)  Conscious Sedation brochure given.  11)  Hemorrhoids brochure given.  12)  High Fiber, Low Fat  Healthy Eating Plan brochure given.   Appended Document: hemorroids.Marland Kitchenem Rx for moviprep sent to [facility] by mistake.  Called [facility] and talked with Beth.  Cancelled Rx. Resent to local pharmacy.   Clinical Lists Changes  Medications: Added new medication of MOVIPREP 100 GM  SOLR (PEG-KCL-NACL-NASULF-NA ASC-C) As per prep instructions. - Signed Added new medication of * ANALPRAM ADVANCED KIT Use as directed - Signed Rx of MOVIPREP 100 GM  SOLR (PEG-KCL-NACL-NASULF-NA ASC-C) As per prep instructions.;  #1 x 0;  Signed;  Entered by: Ashok Cordia RN;  Authorized by: Mardella Layman MD [facility];  Method used: Electronically to [facility] [address] [place], [place]  [address], Ph: [phone], Fax: [phone] Rx of [facility] ADVANCED KIT Use as directed;  #1 x 0;  Signed;  Entered by: Ashok Cordia RN;  Authorized by: Mardella Layman MD [facility];  Method used: Faxed to [facility] [phone]*, [address] [place], [place]  [address], Ph: [phone], Fax:  [phone] Rx of MOVIPREP 100 GM  SOLR (PEG-KCL-NACL-NASULF-NA ASC-C) As per prep instructions.;  #1 x 0;  Signed;  Entered by: Ashok Cordia RN;   Authorized by: Mardella Layman MD [facility];  Method used: Faxed to [facility] [phone]*, [address] [place], [place]  [address], Ph: [phone], Fax: [phone] Orders: Added new Test order of Colonoscopy (Colon) - Signed    Prescriptions: MOVIPREP 100 GM  SOLR (PEG-KCL-NACL-NASULF-NA ASC-C) As per prep instructions.  #1 x 0   Entered by:   Ashok Cordia RN   Authorized by:   Mardella Layman MD [facility]   Signed by:   Ashok Cordia RN on 01/10/2010   Method used:   Faxed to ...       [facility] [address] [phone]* (retail)       [address]       [place], [place]  [address]       Ph: [phone]       Fax: [phone]   RxID:   [id number] RKYHCWCB ADVANCED KIT Use as directed  #1 x 0   Entered by:   Ashok Cordia RN   Authorized by:   Mardella Layman MD [facility]   Signed by:   Ashok Cordia RN on 01/10/2010   Method used:   Faxed to ...       [facility] [address] [phone]* (retail)       [address]       [place], [place]  [address]       Ph: [phone]       Fax: [phone]   RxID:   [id number] MOVIPREP 100 GM  SOLR (PEG-KCL-NACL-NASULF-NA ASC-C) As per prep instructions.  #1 x 0   Entered by:   Ashok Cordia RN   Authorized by:   Mardella Layman MD [facility]   Signed by:   Ashok Cordia RN on 01/10/2010   Method used:   [facility]       [address]       [place], [place]  [address]       Ph: [phone]       Fax: [phone]   RxID:   [id number]

## 2010-09-30 NOTE — Cardiovascular Report (Signed)
Summary: Office Visit Remote   Office Visit Remote   Imported By: Roderic Ovens 05/26/2010 16:11:51  _____________________________________________________________________  External Attachment:    Type:   Image     Comment:   External Document

## 2010-09-30 NOTE — Progress Notes (Signed)
Summary: ov notes  Phone Note Call from Patient Call back at Home Phone [phone]   Caller: Patient Call For: clance Summary of Call: pt needs ov notes from 1st visit w/ kc as well as last ov w/ kc. (diagnosis needs to be included with this). pt wants to pick this up today. call when he can pick this up.  Initial call taken by: Tivis Ringer, CNA,  April 08, 2010 9:56 AM  Follow-up for Phone Call        Copied and called patient.//Juanita Follow-up by: Darletta Moll,  April 09, 2010 8:19 AM

## 2010-09-30 NOTE — Progress Notes (Signed)
  Phone Note Other Incoming   Request: Send information Summary of Call: Request for records received from [facility]. Request forwarded to [facility].     

## 2010-09-30 NOTE — Progress Notes (Signed)
Summary: FYI  Phone Note Call from Patient Call back at [facility] Phone [phone]   Caller: Patient Call For: Dorene Bruni Summary of Call: FYI: Pt states he went to [facility] er today, diagnosis: pneumonia, was given rxs for antibiotics. Initial call taken by: Darletta Moll,  June 04, 2010 9:13 AM  Follow-up for Phone Call        Spoke with pt and he wanted to let [facility] know that he went to [facility] ER last night and was diagnosed with pneumonia and was given a course of antibiotics. Pt did not know the name or strength of the medication. He has a follow-up appt on 06-17-10 with KC. Carron Curie CMA  June 04, 2010 9:47 AM   Additional Follow-up for Phone Call Additional follow up Details #1::        he sees Dr. Sherene Sires for pulmonary and me for sleep.  have him call us if he is not improving on his antibiotic treatment. Additional Follow-up by: Barbaraann Share MD,  June 04, 2010 12:34 PM    Additional Follow-up for Phone Call Additional follow up Details #2::    pt aware. Carron Curie CMA  June 04, 2010 1:00 PM

## 2010-09-30 NOTE — Assessment & Plan Note (Signed)
Summary: ABD PAIN /NWS   Vital Signs:  Patient profile:   50 year old male Height:      71 inches Weight:      273.25 pounds BMI:     38.25 O2 Sat:      96 % on Room air Temp:     98.4 degrees F oral Pulse rate:   50 / minute BP sitting:   100 / 62  (left arm) Cuff size:   large  Vitals Entered By: Margaret Pyle, CMA (June 27, 2010 10:12 AM)  O2 Flow:  Room air  CC: Abd pain 2 weeks   Primary Care Pierre Dellarocco:  Oliver Barre, MD   CC:  Abd pain 2 weeks.  History of Present Illness: pt with [employer] but was scheduled so I will eval -   Pt c/o abd pain, seemed to begin after [place] the state [place] on sun (5 days ago), then woke up on mon with pain and suspected he had constipation;  tried senakot x 3 doses with no BM that day; on same pain meds for 1 yr without prior constipation problems and no other recnet change in diet or meds;  pain seems midl to mod, constant x 4 days,some less today then when started but still about  5-6/10 per pt; dull , better and worse during the day; eating makes mild worse,, but type of diet makes no change,  change in position no relief;  has had several hard stools in the past wk with 1 episode BRBPR painless small volume , made appt with dr Jarold Motto nov 1  vur here this am after saw morning news show that mentioned E coli outbreak assoc with state fair he attended;  no n/v, fever, abd distension, back pain, other blood, or diarrhea  Preventive Screening-Counseling & Management      Drug Use:  no.    Problems Prior to Update: 1)  Abdominal Pain, Generalized  (ICD-789.07) 2)  Dyspnea  (ICD-786.05) 3)  Chest Pain  (ICD-786.50) 4)  Chest Xray, Abnormal  (ICD-793.1) 5)  Allergic Rhinitis Cause Unspecified  (ICD-477.9) 6)  Hemorrhage of Rectum and Anus  (ICD-569.3) 7)  Other Chest Pain  (ICD-786.59) 8)  Special Screening Malig Neoplasms Other Sites  (ICD-V76.49) 9)  Erectile Dysfunction, Organic  (ICD-607.84) 10)   Hyperlipidemia  (ICD-272.4) 11)  Fatigue  (ICD-780.79) 12)  Obstructive Sleep Apnea  (ICD-327.23) 13)  Hemorrhoids  (ICD-455.6) 14)  Constipation  (ICD-564.00) 15)  Sleep Apnea  (ICD-780.57) 16)  Pacemaker, Permanent  (ICD-V45.01) 17)  Atrial Fibrillation  (ICD-427.31) 18)  Combined Heart Failure, Chronic  (ICD-428.42) 19)  Coronary Artery Disease Nstemi 2009  (ICD-414.00) 20)  AMI  (ICD-410.90) 21)  Hyperlipidemia  (ICD-272.4) 22)  Pre-operative Cardiovascular Examination  (ICD-V72.81) 23)  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) 24)  Ankle Pain, Right  (ICD-719.47) 25)  Hepatotoxicity, Drug-induced, Risk of  (ICD-V58.69) 26)  Leg Pain, Left  (ICD-729.5) 27)  Lumbar Radiculopathy, Right  (ICD-724.4) 28)  Leg Pain, Bilateral  (ICD-729.5) 29)  Cough  (ICD-786.2) 30)  Pancreatitis, Hx of  (ICD-V12.70) 31)  Peptic Ulcer Disease  (ICD-533.90) 32)  Congestive Heart Failure  (ICD-428.0) 33)  Chest Pain  (ICD-786.50) 34)  Hiatal Hernia  (ICD-553.3) 35)  Reflux Esophagitis  (ICD-530.11) 36)  External Hemorrhoids  (ICD-455.3) 37)  Colonic Polyps, Hyperplastic, Hx of  (ICD-V12.72) 38)  Special Screening Malignant Neoplasm of Prostate  (ICD-V76.44) 39)  Hyperthyroidism  (ICD-242.90) 40)  Pulmonary Embolism,  Hx of  (ICD-V12.51) 41)  Gerd  (ICD-530.81) 42)  Drug Abuse, Hx of  (ICD-V15.89) 43)  Alcohol Abuse  (ICD-305.00) 44)  Blood in Stool  (ICD-578.1) 45)  Depression  (ICD-311)  Medications Prior to Update: 1)  Carvedilol 25 Mg Tabs (Carvedilol) .[name].. 1 By Mouth Two Times A Day 2)  Aspirin Ec 325 Mg Tbec (Aspirin) .[name].. 1 Tablet By Mouth Once Daily 3)  Simvastatin 80 Mg Tabs (Simvastatin) .[name].. 1 By Mouth Once Daily 4)  Methimazole 10 Mg Tabs (Methimazole) .... Qd 5)  Nitroglycerin 0.4 Mg Subl (Nitroglycerin) .[name].. 1 Tab As Needed 6)  Tikosyn 500 Mcg Caps (Dofetilide) .... Take One Capsule Two Times A Day 7)  Klor-Con M20 20 Meq Cr-Tabs (Potassium Chloride Crys Cr)  .... Take 1 By Mouth Qd 8)  Omeprazole 20 Mg Cpdr (Omeprazole) .[name].. 1 By Mouth Once Daily 9)  Gabapentin 100 Mg Caps (Gabapentin) .... Take One Tablet Once Daily 10)  Propoxyphene N-Apap 100-650 Mg Tabs (Propoxyphene N-Apap) .... Take One Tablet Every 8 Hours As Needed 11)  Cozaar 50 Mg Tabs (Losartan Potassium) .... Take One Tablet By Mouth Daily 12)  Benefiber  Powd (Wheat Dextrin) .... One Scoop in Water Once A Day 13)  Levitra 20 Mg Tabs (Vardenafil Hcl) .[name].. 1 By Mouth Every Other Day As Needed 14)  Flonase 50 Mcg/act  Susp (Fluticasone Propionate) .... Two Puffs Each Nostril Daily 15)  Hydrocodone-Acetaminophen 5-325 Mg Tabs (Hydrocodone-Acetaminophen) .[name].. 1po Q 6 Hrs As Needed Pain 16)  Astepro 0.15 % Soln (Azelastine Hcl) .... 2 Each Nostril 1-2 Times A Day.  Current Medications (verified): 1)  Carvedilol 25 Mg Tabs (Carvedilol) .[name].. 1 By Mouth Two Times A Day 2)  Aspirin Ec 325 Mg Tbec (Aspirin) .[name].. 1 Tablet By Mouth Once Daily 3)  Simvastatin 40 Mg Tabs (Simvastatin) .[name].. 1po Once Daily 4)  Methimazole 10 Mg Tabs (Methimazole) .... Qd 5)  Nitroglycerin 0.4 Mg Subl (Nitroglycerin) .[name].. 1 Tab As Needed 6)  Tikosyn 500 Mcg Caps (Dofetilide) .... Take One Capsule Two Times A Day 7)  Klor-Con M20 20 Meq Cr-Tabs (Potassium Chloride Crys Cr) .... Take 1 By Mouth Qd 8)  Omeprazole 40 Mg Cpdr (Omeprazole) .[name].. 1po Once Daily 9)  Gabapentin 100 Mg Caps (Gabapentin) .... Take One Tablet Once Daily 10)  Cozaar 50 Mg Tabs (Losartan Potassium) .... Take One Tablet By Mouth Daily 11)  Benefiber  Powd (Wheat Dextrin) .... One Scoop in Water Once A Day 12)  Levitra 20 Mg Tabs (Vardenafil Hcl) .[name].. 1 By Mouth Every Other Day As Needed 13)  Hydrocodone-Acetaminophen 5-325 Mg Tabs (Hydrocodone-Acetaminophen) .[name].. 1po Q 6 Hrs As Needed Pain 14)  Astepro 0.15 % Soln (Azelastine Hcl) .... 2 Each Nostril 1-2 Times A Day. 15)  Colace 100 Mg Caps (Docusate Sodium) .[name].. 1po Two Times A Day As Needed 16)   Miralax  Powd (Polyethylene Glycol 3350) .Marland KitchenMarland KitchenMarland Kitchen 17 Gm in Water By Mouth Once Daily  Allergies (verified): No Known Drug Allergies  Past History:  Past Medical History: Last updated: 07/30/2009 Depression GERD borderline HTN obesity Coronary artery disease      a.  S/P nstemi and cath 05/2008 - nonobs. dzs. Pulmonary embolism, hx of - 8/04      a.  previously on coumadin Atrial fibrillation - paroxysmal      a.  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 Congestive heart failure      a.  EF 50% by echo in 11/09 Peptic ulcer disease Pancreatitis, hx of COUGH     - onset 12/09     - Off  ACE 09/06/2008 sleep apena  Past Surgical History: Last updated: 06/06/2010 Pacemaker placement-Medtronic Adapta, single chamber Fingers removed from right hand s/p lumbar surgury  - lumbar stenosis - Dr Dutch Quint - july 2010  Social History: Last updated: 06/27/2010 Former Smoker.  Quit in 2004.  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  Quit in 2004.  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   Head:  normocephalic and atraumatic.   Eyes:  vision grossly intact, pupils equal, and pupils round.   Ears:  R ear normal and L ear normal.   Nose:  no external deformity and no nasal discharge.   Mouth:  no  gingival abnormalities and pharynx pink and moist.   Neck:  supple and no masses.   Lungs:  normal respiratory effort and normal breath sounds, no frank rales or wheezing.   Heart:  normal rate and regular rhythm.   Abdomen:  soft and normal bowel sounds.  with mild epigatric tender, wihtout guarding or rebound Extremities:  no edema, no erythema    Impression & Recommendations:  Problem # 1:  ABDOMINAL PAIN, GENERALIZED (ICD-789.07)  iwth hx of constipation, PUD, and reflux; current symtpoms most c/w constipation but also with epigastric tender - mild;  pt has [facility] (not accepted by [facility]) but will try to order labs, incr the omeprazole to 40mg , and asked to f/u with his Primary MD assigened per [facility];  pt has self referred to Dr Patterson/GI with appt for nov 1, but I have advised he may need to get the referral from his primary MD assigned per [facility] ;  also has been on simv 80 mg for some time and tolerated, but due to symptoms and higher relative risk of 80mg , will decrease to the 40 mg  Orders: No Charge Patient Arrived (NCPA0) (NCPA0) TLB-BMP (Basic Metabolic Panel-BMET) (80048-METABOL) TLB-CBC Platelet - w/Differential (85025-CBCD) TLB-Lipase (83690-LIPASE) TLB-H. Pylori Abs(Helicobacter Pylori) (86677-HELICO) TLB-Udip w/ Micro (81001-URINE)  Problem # 2:  HYPERLIPIDEMIA (ICD-272.4)  His updated medication list for this problem includes:    Simvastatin 40 Mg Tabs (Simvastatin) .[name]... 1po once daily as above  Labs Reviewed: SGOT: 25 (01/10/2010)   SGPT: 29 (01/10/2010)   HDL:48.00 (07/18/2009), 49.3 (11/05/2008)  LDL:86 (07/18/2009), 98 (11/05/2008)  Chol:152 (07/18/2009), 166 (11/05/2008)  Trig:89.0 (07/18/2009), 94 (11/05/2008)  Problem # 3:  CONSTIPATION (ICD-564.00)  His updated medication list for this problem includes:    Benefiber Powd (Wheat dextrin) ..... One scoop in water once a day    Colace 100 Mg Caps (Docusate  sodium) .[name]... 1po two times a day as needed    Miralax Powd (Polyethylene glycol 3350) .Marland KitchenMarland KitchenMarland KitchenMarland Kitchen 17 gm in water by mouth once daily advised to start above, with senakot as  needed   Complete Medication List: 1)  Carvedilol 25 Mg Tabs (Carvedilol) .[name].. 1 by mouth two times a day 2)  Aspirin Ec 325 Mg Tbec (Aspirin) .[name].. 1 tablet by mouth once daily 3)  Simvastatin 40 Mg Tabs (Simvastatin) .[name].. 1po once daily 4)  Methimazole 10 Mg Tabs (Methimazole) .... Qd 5)  Nitroglycerin 0.4 Mg Subl (Nitroglycerin) .[name].. 1 tab as needed 6)  Tikosyn 500 Mcg Caps (Dofetilide) .... Take one capsule two times a day 7)  Klor-con M20 20 Meq Cr-tabs (Potassium chloride crys cr) .... Take 1 by mouth qd 8)  Omeprazole 40 Mg Cpdr (Omeprazole) .[name].. 1po once daily 9)  Gabapentin 100 Mg Caps (Gabapentin) .... Take one tablet once daily 10)  Cozaar 50 Mg Tabs (Losartan potassium) .... Take one tablet by mouth daily 11)  Benefiber Powd (Wheat dextrin) .... One scoop in water once a day 12)  Levitra 20 Mg Tabs (Vardenafil hcl) .[name].. 1 by mouth every other day as needed 13)  Hydrocodone-acetaminophen 5-325 Mg Tabs (Hydrocodone-acetaminophen) .[name].. 1po q 6 hrs as needed pain 14)  Astepro 0.15 % Soln (Azelastine hcl) .... 2 each nostril 1-2 times a day. 15)  Colace 100 Mg Caps (Docusate sodium) .[name].. 1po two times a day as needed 16)  Miralax Powd (Polyethylene glycol 3350) .Marland KitchenMarland Kitchen. 17 gm in water by mouth once daily 17)  Amoxicillin 500 Mg Caps (Amoxicillin) .... 2 po two times a day 18)  Clarithromycin 500 Mg Tabs (Clarithromycin) .[name].. 1po two times a day  Patient Instructions: 1)  increase the omeprazole to 40 mg per day 2)  Decrease the simvastatin to 40 mg due to recent FDA warning about the higher risk of the 80 mg strength 3)  Continue all previous medications as before this visit  4)  You should also take colace 100 mg two times a day as needed, as well as the Miralax daily 5)  Please go to the Lab in the basement for your  blood and/or urine tests today 6)  Please call the number on the [facility] Card for results of your testing  7)  Please schedule an appointment with your primary doctor with [facility] Prescriptions: OMEPRAZOLE 40 MG CPDR (OMEPRAZOLE) 1po once daily  #90 x 3   Entered and Authorized by:   Corwin Levins MD   Signed by:   Corwin Levins MD on 06/27/2010   Method used:   Print then Give to Patient   RxID:   [id number] SIMVASTATIN 40 MG TABS (SIMVASTATIN) 1po once daily  #90 x 3   Entered and Authorized by:   Corwin Levins MD   Signed by:   Corwin Levins MD on 06/27/2010   Method used:   Print then Give to Patient   RxID:   [id number]    Orders Added: 1)  No Charge Patient Arrived (NCPA0) [NCPA0] 2)  TLB-BMP (Basic Metabolic Panel-BMET) [80048-METABOL] 3)  TLB-CBC Platelet - w/Differential [85025-CBCD] 4)  TLB-Lipase [83690-LIPASE] 5)  TLB-H. Pylori Abs(Helicobacter Pylori) [86677-HELICO] 6)  TLB-Udip w/ Micro [81001-URINE]

## 2010-09-30 NOTE — Assessment & Plan Note (Signed)
Summary: [name]   Referring Provider:  na Primary Provider:  Oliver Barre, MD   CC:  rov/ pt reports chest pains and arm pains recently.  He used his nitro and the pain went away.  He reports profuse sweating.  [name]  History of Present Illness: 50 year old African American male with prior history of mixed systolic and diastolic congestive heart failure as well as atrial fibrillation currently on Tikosyn therapy. He also has a history of hypertensive heart disease  and by cath in 2009 noin obstructive CAD . He continues to complain of fatigue and exercise intolerance.  Attempts to begin an ACE inhibitor were interrupted by cough.  He has been working on CPAP for OSA  He also has had recent recurrnt problems with CP with som radiation for which he takes NtG not with stnading his lack of CAD  He also has obstructive sleeping patterns at night and daytime somnolence consistent with sleep apnea a sleep study has not been done  Current Medications (verified): 1)  Carvedilol 25 Mg Tabs (Carvedilol) .[name].. 1 By Mouth Two Times A Day 2)  Aspirin Ec 325 Mg Tbec (Aspirin) .[name].. 1 Tablet By Mouth Once Daily 3)  Simvastatin 80 Mg Tabs (Simvastatin) .[name].. 1 By Mouth Once Daily 4)  Methimazole 10 Mg Tabs (Methimazole) .... Qd 5)  Nitroglycerin 0.4 Mg Subl (Nitroglycerin) .[name].. 1 Tab As Needed 6)  Tikosyn 500 Mcg Caps (Dofetilide) .... Take One Capsule Two Times A Day 7)  Klor-Con M20 20 Meq Cr-Tabs (Potassium Chloride Crys Cr) .... Take 1 By Mouth Qd 8)  Omeprazole 20 Mg Cpdr (Omeprazole) .[name].. 1 By Mouth Once Daily 9)  Gabapentin 100 Mg Caps (Gabapentin) .... Take One Tablet Once Daily 10)  Propoxyphene N-Apap 100-650 Mg Tabs (Propoxyphene N-Apap) .... Take One Tablet Every 8 Hours As Needed 11)  Cozaar 50 Mg Tabs (Losartan Potassium) .... Take One Tablet By Mouth Daily 12)  Canasa 1000 Mg  Supp (Mesalamine) .[name].. 1 Per Rectum At Bedtime 13)  Benefiber  Powd (Wheat Dextrin) .... One Scoop in Water Once A Day 14)   Levitra 20 Mg Tabs (Vardenafil Hcl) .[name].. 1 By Mouth Every Other Day As Needed 15)  Nasonex 50 Mcg/act Susp (Mometasone Furoate) .... 2 Sprays Each Nostril Once Daily  Allergies (verified): No Known Drug Allergies  Past History:  Past Medical History: Last updated: 07/30/2009 Depression GERD borderline HTN obesity Coronary artery disease      a.  S/P nstemi and cath 05/2008 - nonobs. dzs. Pulmonary embolism, hx of - 8/04      a.  previously on coumadin Atrial fibrillation - paroxysmal      a.  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 Congestive heart failure      a.  EF 50% by echo in 11/09 Peptic ulcer disease Pancreatitis, hx of COUGH     - onset 12/09     - Off  ACE 09/06/2008 sleep apena  Past Surgical History: Last updated: 07/30/2009 Pacemaker placement-Medtronic Adapta, single chamber Fingers removed from right hand  Family History: Last updated: 06/18/2009 heart disease- Mother DM Breast CA- Mother No FH of Colon Cancer: Liver Cancer: Father   Social History: Last updated: 09/06/2008 Former Smoker.  Quit in 2004.  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 HEENT Normal.  Neck veins were flat, carotids were brisk.  Lungs were clear.  Heart sounds were regular without murmurs or gallops.    Abdomen was soft with active bowel sounds. There is no clubbing cyanosis or edema. Skin Warm and dry    PPM Specifications Following MD:  Sherryl Manges, MD     PPM Vendor:  Medtronic     PPM Model Number:  [id number]     PPM Serial Number:  [id number] [facility] PPM DOI:  06/22/2008     PPM Implanting MD:  Sherryl Manges, MD  Lead 1    Location: RA     DOI: 06/22/2008     Model #: [id number]     Serial #: [id number]     Status: active  Magnet Response Rate:  BOL 85 ERI  65  Indications:  A-fib   PPM Follow Up Remote Check?  No Battery Voltage:  2.79 V     Battery Est. Longevity:  12 YEARS     Pacer Dependent:  No       PPM Device Measurements Atrium  Amplitude: 2.8 mV, Impedance: 474 ohms, Threshold: 0.5 V at 0.4 msec  Episodes MS Episodes:  58013     Percent Mode Switch:  30.1%     Coumadin:  No  Parameters Mode:  AAI     Lower Rate Limit:  50     Next Remote Date:  05/01/2010     Next Cardiology Appt Due:  01/30/2011 Tech Comments:  Normal device function.  RA threshold was previously reported as elevated, normal today, but with AS events following AP events.  Capture clearly lost though at 0.5V at 0.60msec.  RA output decreased today to 2V at 0.29msec.  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  ROV 12 months SK. Gypsy Balsam RN BSN  January 30, 2010 10:09 AM   Impression & Recommendations:  Problem # 1:  OTHER CHEST PAIN (ICD-786.59) Cpontinues with presumed non cardiac chest pain.  As he has impending back surgery, we will repeat a myoview  Hehas had not had NSTEM as best as i can tell from EMR His updated medication list for this problem includes:    Carvedilol 25 Mg Tabs (Carvedilol) .[name]... 1 by mouth two times a day    Aspirin Ec 325 Mg Tbec (Aspirin) .[name]... 1 tablet by mouth once daily    Nitroglycerin 0.4 Mg Subl (Nitroglycerin) .[name]... 1 tab as needed  Problem # 2:  ATRIAL FIBRILLATION (ICD-427.31) STill with some aFIb aobut 25% of the time, with some atrila paced oversensing for which the device was  reprogrammed  K, MG were normal on 5/13 continue tikosyn    Carvedilol 25 Mg Tabs (Carvedilol) .[name]... 1 by mouth two times a day    Aspirin Ec 325 Mg Tbec (Aspirin) .[name]... 1 tablet by mouth once daily    Tikosyn 500 Mcg Caps (Dofetilide) .[name]... Take one capsule two times a day  Orders: EKG  w/ Interpretation (93000)  Problem # 3:  PACEMAKER-MEDTRONIC ADAPTA (ICD-V45.[name] Kitchen01) Device parameters and data were reviewed and reprpgraming of atrial ERP  Other Orders: Nuclear Stress Test (Nuc Stress Test)  Patient Instructions: 1)  Your physician recommends that you schedule a follow-up appointment in: 1 year 2)  and  3)  Your physician has requested that you have an adenosine myoview.  For further information please visit [email or web address]  Please follow instruction sheet, as given.

## 2010-09-30 NOTE — Letter (Signed)
Summary: [facility]  OV/[facility]   Imported By: Sherian Rein 09/20/2009 07:44:24  _____________________________________________________________________  External Attachment:    Type:   Image     Comment:   External Document

## 2010-09-30 NOTE — Progress Notes (Signed)
Summary: flonase not helping-increase humidity  Phone Note Call from Patient   Caller: [name] Call For: Noel Rodier Summary of Call: nose spray kc gave pt not helping still stuffy  Initial call taken by: Oneita Jolly,  May 19, 2010 9:41 AM  Follow-up for Phone Call        Pt states that the flonase has not helped his stuffiness at all. He staes he has not increased the heater on his humidifier as instructed but that [facility] is scheduled to come out today and he will have them adjust it. Pt is requesting another nasal spray. Pelase advise. Carron Curie CMA  May 19, 2010 10:54 AM [facility] [address]  Additional Follow-up for Phone Call Additional follow up Details #1::        the lack of humidity can cause the nose to swell shut.  would not want to change meds until you turn the heat up on your humidifier.  Let me know if this does not help your stuffiness. Additional Follow-up by: Barbaraann Share MD,  May 19, 2010 4:47 PM    Additional Follow-up for Phone Call Additional follow up Details #2::    Pt advised. Carron Curie CMA  May 19, 2010 5:09 PM

## 2010-10-02 NOTE — Progress Notes (Addendum)
Summary: Cardiology Phone Note - Tikosyn  Phone Note Call from Patient   Caller: Patient Summary of Call: Pt called to state he realized he only has two more doses of Tikosyn left - he has enough through tomorrow morning but was wondering if we could call it in. His usual pharmacy does not have it in stock, but he found that [facility] can dispense him #60. D/w Dr. Deborah Chalk because of prescribing privileges, he will call Tikosyn in to [facility] at pt's request. Pt notified and appreciative. Initial call taken by: Ronie Spies PA-C

## 2010-10-02 NOTE — Letter (Signed)
Summary: Device-Delinquent Phone Journalist, newspaper, Main Office  [address]   [place], [place] [address]   Phone: [phone]  Fax: [phone]     August 15, 2010 MRN: [id number]   [name] [address] [place], [place]  [address]   Dear Mr. Strohmeyer,  According to our records, you were scheduled for a device phone transmission on  08-14-2010.     We did not receive any results from this check.  If you transmitted on your scheduled day, please call us to help troubleshoot your system.  If you forgot to send your transmission, please send one upon receipt of this letter.  Thank you,   Architectural technologist [facility]

## 2010-10-08 NOTE — Progress Notes (Signed)
  Phone Note Other Incoming   Request: Send information Summary of Call: Request for records received from [facility]. Request forwarded to [employer].  05/2010

## 2010-10-08 NOTE — Progress Notes (Signed)
  [facility] Request received sent to [facility]  September 29, 2010 1:55 PM

## 2010-10-15 ENCOUNTER — Encounter: Payer: Self-pay | Admitting: Internal Medicine

## 2010-10-15 ENCOUNTER — Encounter: Payer: Self-pay | Admitting: Pulmonary Disease

## 2010-10-16 ENCOUNTER — Encounter: Payer: Self-pay | Admitting: Internal Medicine

## 2010-10-16 ENCOUNTER — Encounter (INDEPENDENT_AMBULATORY_CARE_PROVIDER_SITE_OTHER): Payer: Medicaid Other | Admitting: Internal Medicine

## 2010-10-16 DIAGNOSIS — Z950 Presence of cardiac pacemaker: Secondary | ICD-10-CM

## 2010-10-16 DIAGNOSIS — I495 Sick sinus syndrome: Secondary | ICD-10-CM

## 2010-10-16 DIAGNOSIS — I4891 Unspecified atrial fibrillation: Secondary | ICD-10-CM

## 2010-10-16 DIAGNOSIS — G5600 Carpal tunnel syndrome, unspecified upper limb: Secondary | ICD-10-CM | POA: Insufficient documentation

## 2010-10-17 ENCOUNTER — Telehealth (INDEPENDENT_AMBULATORY_CARE_PROVIDER_SITE_OTHER): Payer: Self-pay | Admitting: *Deleted

## 2010-10-20 ENCOUNTER — Telehealth: Payer: Self-pay | Admitting: Internal Medicine

## 2010-10-22 NOTE — Progress Notes (Signed)
  Phone Note Other Incoming   Request: Send information Summary of Call: Request for records received from [facility]. Request forwarded to [facility].     

## 2010-10-22 NOTE — Miscellaneous (Signed)
  Clinical Lists Changes  Observations: Added new observation of ECHOINTERP: - Left ventricle: There is septal hypokinesis and inferior     hypokinesis. Some of this may be related to the pacing function.     The EF is 35%. The cavity size was mildly dilated. Wall thickness     was increased in a pattern of mild LVH.   - Right ventricle: The cavity size was mildly dilated. Systolic     function was mildly reduced.   - Right atrium: The atrium was mildly dilated. (09/19/2010 14:01)      Echocardiogram  Procedure date:  09/19/2010  Findings:      - Left ventricle: There is septal hypokinesis and inferior     hypokinesis. Some of this may be related to the pacing function.     The EF is 35%. The cavity size was mildly dilated. Wall thickness     was increased in a pattern of mild LVH.   - Right ventricle: The cavity size was mildly dilated. Systolic     function was mildly reduced.   - Right atrium: The atrium was mildly dilated.

## 2010-10-28 NOTE — Letter (Signed)
Summary: CMN for CPAP Supplies/[facility]  CMN for CPAP Supplies/[facility]   Imported By: Sherian Rein 10/22/2010 12:03:05  _____________________________________________________________________  External Attachment:    Type:   Image     Comment:   External Document

## 2010-10-28 NOTE — Cardiovascular Report (Signed)
Summary: Office Visit   Office Visit   Imported By: Roderic Ovens 10/22/2010 16:16:14  _____________________________________________________________________  External Attachment:    Type:   Image     Comment:   External Document

## 2010-10-28 NOTE — Progress Notes (Signed)
Summary: question on meds  Phone Note Call from Patient Call back at Work Phone [phone]   Reason for Call: Talk to Nurse Summary of Call: pt has question re meds (coumadin) Initial call taken by: Roe Coombs,  October 20, 2010 9:30 AM  Follow-up for Phone Call        Phone Call Completed PER OFFICE NOTE  PT TO HAVE CONSULT WITH DR Johney Frame RE ABLATION IF PROCEDURE SCHEDULED THAN MAY NEED TO INITIATE COUMADIN THERAPY  PER DR Graciela Husbands. Follow-up by: Scherrie Bateman, LPN,  October 20, 2010 2:01 PM

## 2010-10-28 NOTE — Assessment & Plan Note (Signed)
Summary: eph/afib 4-6 wk f/u appt/mt/ echo 1030/per nikki rs per pt/mj/kl   Visit Type:  Post-hospital Referring Provider:  na Primary Provider:  Oliver Barre, MD   CC:  Chest pains.  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 He takes Therapist, occupational. Magnesium and potassium levels were normal and hospital during December.  he continues to complain of fatigue.  His other major issue has been neck and arm pain. He seen a neurosurgeon. He also complains of tingling and discomfort in his arm when he goes to bed.     Current Medications (verified): 1)  Carvedilol 25 Mg Tabs (Carvedilol) .[name].. 1 By Mouth Two Times A Day 2)  Aspirin Ec 325 Mg Tbec (Aspirin) .[name].. 1 Tablet By Mouth Once Daily 3)  Simvastatin 80 Mg Tabs (Simvastatin) .... Take One Tablet By Mouth Daily At Bedtime 4)  Methimazole 10 Mg Tabs (Methimazole) .... Qd 5)  Nitroglycerin 0.4 Mg Subl (Nitroglycerin) .[name].. 1 Tab As Needed 6)  Tikosyn 500 Mcg Caps (Dofetilide) .... Take One Capsule Two Times A Day 7)  Klor-Con M20 20 Meq Cr-Tabs (Potassium Chloride Crys Cr) .... Take 1 By Mouth Qd 8)  Omeprazole 40 Mg Cpdr (Omeprazole) .[name].. 1po Once Daily 9)  Gabapentin 100 Mg Caps (Gabapentin) .... Take One Tablet Once Daily 10)  Cozaar 50 Mg Tabs (Losartan Potassium) .... Take One Tablet By Mouth Daily 11)  Benefiber  Powd (Wheat Dextrin) .... One Scoop in Water Once A Day 12)  Levitra 20 Mg Tabs (Vardenafil Hcl) .[name].. 1 By Mouth Every Other Day As Needed 13)  Hydrocodone-Acetaminophen 5-325 Mg Tabs (Hydrocodone-Acetaminophen) .[name].. 1po Q 6 Hrs As Needed Pain 14)  Astepro 0.15 % Soln (Azelastine Hcl) .... 2 Each Nostril 1-2 Times A Day. 15)  Colace 100 Mg Caps (Docusate Sodium) .[name].. 1po Two Times A Day As Needed 16)   Miralax  Powd (Polyethylene Glycol 3350) .Marland KitchenMarland KitchenMarland Kitchen 17 Gm in Water By Mouth Once Daily 17)  Hydrocortisone Acetate 25 Mg Supp (Hydrocortisone Acetate) .... Two Times A Day  Allergies (verified): No Known Drug Allergies  Past History:  Past Medical History: Last updated: 07/30/2009 Depression GERD borderline HTN obesity Coronary artery disease      a.  S/P nstemi and cath 05/2008 - nonobs. dzs. Pulmonary embolism, hx of - 8/04      a.  previously on coumadin Atrial fibrillation - paroxysmal      a.  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 Congestive heart failure      a.  EF 50% by echo in 11/09 Peptic ulcer disease Pancreatitis, hx of COUGH     - onset 12/09     - Off  ACE 09/06/2008 sleep apena  Family History: Reviewed history from 06/18/2009 and no changes required. heart disease- Mother DM Breast CA- Mother No FH of Colon Cancer: Liver Cancer: Father   Social History: Reviewed history from 06/27/2010 and no changes required. Former Smoker.  Quit in 2004.  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 HEENT Normal.  Neck veins were flat, carotids were brisk.  Lungs were clear.  Heart sounds were regular without murmurs or gallops.  Abdomen was soft with active bowel  sounds. There is no clubbing cyanosis or edema. Skin Warm and dry there is thenar wasting in the left hand as well as weakness in the left thumb forefinger graft   EKG  Procedure date:  10/16/2010  Findings:      sinus node dysfunction with intermittent atrial pacing frequent PACs  PPM Specifications Following MD:  Sherryl Manges, MD     PPM Vendor:  Medtronic     PPM Model Number:  [id number]     PPM Serial Number:  [id number] PPM DOI:  06/22/2008     PPM Implanting MD:  Sherryl Manges, MD  Lead 1    Location: [facility]     DOI: 06/22/2008     Model #: [id number]     Serial #: [id number]     Status: active  Magnet Response Rate:  BOL 85 ERI  65  Indications:  A-fib   PPM Follow Up Pacer Dependent:  No      Episodes Coumadin:  No  Parameters Mode:  AAI     Lower Rate Limit:  50     Impression & Recommendations:  Problem # 1:  CARPAL TUNNEL SYNDROME, LEFT (ICD-354.0) To my cursory exam he may well have carpal tunnel. I suggested that he get a carpal tunnel splint. She will followup with a neurosurgeon about this later in the month.  Problem # 2:  CHEST PAIN (ICD-786.50) noncardiac His updated medication list for this problem includes:    Carvedilol 25 Mg Tabs (Carvedilol) .[name]... 1 by mouth two times a day    Aspirin Ec 325 Mg Tbec (Aspirin) .[name]... 1 tablet by mouth once daily    Nitroglycerin 0.4 Mg Subl (Nitroglycerin) .[name]... 1 tab as needed  Problem # 3:  ATRIAL FIBRILLATION (ICD-427.31)  he continues to have frequent paroxysms of atrial fibrillation despite his maximum dose of Tikosyn. Will have him see Dr. on for consideration of catheter ablation. Echo from January was reviewed with a left atrial dimension of 4 cm. His left ventricular function was 35 somewhat lower than the Myoview from November of 47%. We will keep eye on this. his CHADS VASC score is one-2;  for now we'll plan to hold off on Coumadin; obviously if he is to have a AF ablation procedure he will need periprocedural  anticoagulation His updated medication list for this problem includes:    Carvedilol 25 Mg Tabs (Carvedilol) .[name]... 1 by mouth two times a day    Aspirin Ec 325 Mg Tbec (Aspirin) .[name]... 1 tablet by mouth once daily    Tikosyn 500 Mcg Caps (Dofetilide) .[name]... Take one capsule two times a day  Orders: EP Referral (Cardiology EP Ref )  Problem # 4:  CORONARY ARTERY DISEASE NSTEMI 2009 (ICD-414.00) stable on current meds; we will reduce his simvastatin from 80-40 His updated  medication list for this problem includes:    Carvedilol 25 Mg Tabs (Carvedilol) .[name]... 1 by mouth two times a day    Aspirin Ec 325 Mg Tbec (Aspirin) .[name]... 1 tablet by mouth once daily    Nitroglycerin 0.4 Mg Subl (Nitroglycerin) .[name]... 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 1 by mouth two times a day    Aspirin Ec 325 Mg Tbec (Aspirin) .[name]... 1 tablet by mouth once daily    Nitroglycerin 0.4 Mg Subl (Nitroglycerin) .[name]... 1 tab as needed    Tikosyn 500 Mcg Caps (Dofetilide) .[name]... Take one capsule two times a day  Patient Instructions: 1)  Your physician has recommended you make the following change in your medication: DECREASE SIMVASTATIN TO 40 MG once daily  2)  You have been referred to DR [facility] NEW EVAL FOR AF ABLATION Prescriptions: SIMVASTATIN 40 MG TABS (SIMVASTATIN) 1 once daily  #30 x 11   Entered by:   Scherrie Bateman, LPN   Authorized by:   Nathen May, MD, [facility]   Signed by:   Scherrie Bateman, LPN on 29/56/2130   Method used:   Electronically to        [facility] [id number]* (retail)       [address]       [place], [place]  [address]       Ph: [phone]       Fax: [phone]   RxID:   [id number]

## 2010-10-30 ENCOUNTER — Ambulatory Visit (INDEPENDENT_AMBULATORY_CARE_PROVIDER_SITE_OTHER): Payer: Medicaid Other | Admitting: Endocrinology

## 2010-10-30 ENCOUNTER — Other Ambulatory Visit: Payer: Medicaid Other

## 2010-10-30 ENCOUNTER — Other Ambulatory Visit: Payer: Self-pay | Admitting: Endocrinology

## 2010-10-30 ENCOUNTER — Encounter: Payer: Self-pay | Admitting: Endocrinology

## 2010-10-30 DIAGNOSIS — E0590 Thyrotoxicosis, unspecified without thyrotoxic crisis or storm: Secondary | ICD-10-CM

## 2010-10-30 DIAGNOSIS — E0500 Thyrotoxicosis with diffuse goiter without thyrotoxic crisis or storm: Secondary | ICD-10-CM | POA: Insufficient documentation

## 2010-10-30 LAB — TSH: TSH: 0.95 u[IU]/mL (ref 0.35–5.50)

## 2010-11-05 ENCOUNTER — Ambulatory Visit: Payer: Medicaid Other | Admitting: Internal Medicine

## 2010-11-06 NOTE — Letter (Signed)
Summary: [facility]  [facility]   Imported By: Lester Marion 10/31/2010 11:29:48  _____________________________________________________________________  External Attachment:    Type:   Image     Comment:   External Document

## 2010-11-10 ENCOUNTER — Ambulatory Visit: Payer: Medicaid Other | Attending: Neurosurgery | Admitting: Physical Therapy

## 2010-11-10 LAB — DIFFERENTIAL
Basophils Absolute: 0 10*3/uL (ref 0.0–0.1)
Basophils Relative: 0 % (ref 0–1)
Lymphocytes Relative: 57 % — ABNORMAL HIGH (ref 12–46)
Monocytes Absolute: 0.4 10*3/uL (ref 0.1–1.0)
Neutro Abs: 1.4 10*3/uL — ABNORMAL LOW (ref 1.7–7.7)
Neutrophils Relative %: 31 % — ABNORMAL LOW (ref 43–77)

## 2010-11-10 LAB — MRSA PCR SCREENING: MRSA by PCR: NEGATIVE

## 2010-11-10 LAB — POCT CARDIAC MARKERS
CKMB, poc: 1.8 ng/mL (ref 1.0–8.0)
Myoglobin, poc: 106 ng/mL (ref 12–200)
Troponin i, poc: <0.05 ng/mL (ref 0.00–0.09)

## 2010-11-10 LAB — POCT I-STAT, CHEM 8
BUN: 18 mg/dL (ref 6–23)
Chloride: 105 mEq/L (ref 96–112)
HCT: 50 % (ref 39.0–52.0)
Potassium: 4.1 mEq/L (ref 3.5–5.1)
Sodium: 139 mEq/L (ref 135–145)

## 2010-11-10 LAB — CBC
HCT: 45.7 % (ref 39.0–52.0)
RDW: 15.9 % — ABNORMAL HIGH (ref 11.5–15.5)
WBC: 4.5 10*3/uL (ref 4.0–10.5)

## 2010-11-10 LAB — CARDIAC PANEL(CRET KIN+CKTOT+MB+TROPI)
CK, MB: 3 ng/mL (ref 0.3–4.0)
CK, MB: 3.1 ng/mL (ref 0.3–4.0)
Relative Index: 0.8 (ref 0.0–2.5)
Relative Index: 0.8 (ref 0.0–2.5)
Troponin I: 0.01 ng/mL (ref 0.00–0.06)
Troponin I: <0.01 ng/mL (ref 0.00–0.06)

## 2010-11-10 LAB — LIPID PANEL
LDL Cholesterol: 89 mg/dL (ref 0–99)
Triglycerides: 70 mg/dL (ref <150)

## 2010-11-10 LAB — CK TOTAL AND CKMB (NOT AT ARMC): Relative Index: 0.5 (ref 0.0–2.5)

## 2010-11-10 LAB — TSH: TSH: 1.832 u[IU]/mL (ref 0.350–4.500)

## 2010-11-10 LAB — MAGNESIUM: Magnesium: 2.2 mg/dL (ref 1.5–2.5)

## 2010-11-11 NOTE — Assessment & Plan Note (Signed)
Summary: NEW ENDO CONSULT/ GRAVES DISEASE/ [facility]  NPI [id number]...   Vital Signs:  Patient profile:   50 year old male Height:      71 inches (180.34 cm) Weight:      271.50 pounds (123.41 kg) BMI:     38.00 O2 Sat:      97 % on Room air Temp:     98.6 degrees F (37.00 degrees C) oral Pulse rate:   63 / minute BP sitting:   114 / 84  (left arm) Cuff size:   large  Vitals Entered By: Brenton Grills CMA Duncan Dull) (October 30, 2010 9:27 AM)  O2 Flow:  Room air CC: New Endo Consult/Graves Disease/Dr. Dewey/aj Is Patient Diabetic? No   Referring Provider:  t Dareen Piano, np, [facility] [employer] Primary Provider:  Dr. Duanne Guess  CC:  New Endo Consult/Graves Disease/Dr. Dewey/aj.  History of Present Illness: pt has h/o hyperthyroidism presumed due to grave's dz, which has been rx'ed with tapazole.  pt states he feels well in general, except for neck pain.  due to his heart dz, he hasn't been able to go-off tapazole for w/u and possible i-131 rx.   Current Medications (verified): 1)  Carvedilol 25 Mg Tabs (Carvedilol) .[name].. 1 By Mouth Two Times A Day 2)  Aspirin Ec 325 Mg Tbec (Aspirin) .[name].. 1 Tablet By Mouth Once Daily 3)  Simvastatin 40 Mg Tabs (Simvastatin) .[name].. 1 Once Daily 4)  Methimazole 10 Mg Tabs (Methimazole) .... Qd 5)  Nitroglycerin 0.4 Mg Subl (Nitroglycerin) .[name].. 1 Tab As Needed 6)  Tikosyn 500 Mcg Caps (Dofetilide) .... Take One Capsule Two Times A Day 7)  Klor-Con M20 20 Meq Cr-Tabs (Potassium Chloride Crys Cr) .... Take 1 By Mouth Qd 8)  Omeprazole 40 Mg Cpdr (Omeprazole) .[name].. 1po Once Daily 9)  Gabapentin 100 Mg Caps (Gabapentin) .... Take One Tablet Once Daily 10)  Cozaar 50 Mg Tabs (Losartan Potassium) .... Take One Tablet By Mouth Daily 11)  Benefiber  Powd (Wheat Dextrin) .... One Scoop in Water Once A Day 12)  Levitra 20 Mg Tabs (Vardenafil Hcl) .[name].. 1 By Mouth Every Other Day As Needed 13)  Hydrocodone-Acetaminophen 5-325 Mg Tabs (Hydrocodone-Acetaminophen) .[name]..  1po Q 6 Hrs As Needed Pain 14)  Astepro 0.15 % Soln (Azelastine Hcl) .... 2 Each Nostril 1-2 Times A Day. 15)  Colace 100 Mg Caps (Docusate Sodium) .[name].. 1po Two Times A Day As Needed 16)  Miralax  Powd (Polyethylene Glycol 3350) .Marland KitchenMarland KitchenMarland Kitchen 17 Gm in Water By Mouth Once Daily 17)  Hydrocortisone Acetate 25 Mg Supp (Hydrocortisone Acetate) .... Two Times A Day  Allergies (verified): No Known Drug Allergies  Past History:  Past Medical History: Last updated: 07/30/2009 Depression GERD borderline HTN obesity Coronary artery disease      a.  S/P nstemi and cath 05/2008 - nonobs. dzs. Pulmonary embolism, hx of - 8/04      a.  previously on coumadin Atrial fibrillation - paroxysmal      a.  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 Congestive heart failure      a.  EF 50% by echo in 11/09 Peptic ulcer disease Pancreatitis, hx of COUGH     - onset 12/09     - Off  ACE 09/06/2008 sleep apena  Family History:  Reviewed history from 06/18/2009 and no changes required. heart disease- Mother DM Breast CA- Mother No FH of Colon Cancer: Liver Cancer: Father   Social History: Reviewed history from 06/27/2010 and no changes required. Former Smoker.  Quit in 2004.  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    Physical Exam  General:  obese.  no distress  Neck:  i cannot tell for sure, but the thyroid seems diffusely enlarged Additional Exam:  (in hospital, dec, 2010) tsh=normal  today: FastTSH                   0.95 uIU/mL          Impression & Recommendations:  Problem # 1:  GRAVES' DISEASE  (ICD-242.00) usually hereditary  Problem # 2:  HYPERTHYROIDISM (ICD-242.90) due to #1  Problem # 3:  CONGESTIVE HEART FAILURE (ICD-428.0) because of his heart dz, he can't go off tapazole for i-131 rx  Other Orders: TLB-TSH (Thyroid Stimulating Hormone) (84443-TSH) Est. Patient Level IV (04540)  Patient Instructions: 1)  blood tests are being ordered for you today.  please call [phone] to hear your test results. 2)  Please schedule a follow-up appointment in 6 months. 3)  if ever you have fever while taking methimazole, stop it and call us, because of the risk of a rare side-effect. 4)  (update: i left message on phone-tree:  same rx)   Orders Added: 1)  TLB-TSH (Thyroid Stimulating Hormone) [84443-TSH] 2)  Est. Patient Level IV [78295]

## 2010-11-12 LAB — DIFFERENTIAL
Basophils Absolute: 0 10*3/uL (ref 0.0–0.1)
Lymphocytes Relative: 51 % — ABNORMAL HIGH (ref 12–46)
Monocytes Absolute: 0.7 10*3/uL (ref 0.1–1.0)
Neutro Abs: 2.3 10*3/uL (ref 1.7–7.7)
Neutrophils Relative %: 35 % — ABNORMAL LOW (ref 43–77)

## 2010-11-12 LAB — CBC
HCT: 42.8 % (ref 39.0–52.0)
RDW: 17.3 % — ABNORMAL HIGH (ref 11.5–15.5)
WBC: 6.7 10*3/uL (ref 4.0–10.5)

## 2010-11-12 LAB — POCT I-STAT, CHEM 8
Creatinine, Ser: 1.2 mg/dL (ref 0.4–1.5)
HCT: 47 % (ref 39.0–52.0)
Hemoglobin: 16 g/dL (ref 13.0–17.0)
Potassium: 3.8 mEq/L (ref 3.5–5.1)
Sodium: 140 mEq/L (ref 135–145)
TCO2: 26 mmol/L (ref 0–100)

## 2010-11-12 LAB — D-DIMER, QUANTITATIVE: D-Dimer, Quant: 0.25 ug/mL-FEU (ref 0.00–0.48)

## 2010-11-12 LAB — LIPASE, BLOOD: Lipase: 27 U/L (ref 11–59)

## 2010-11-22 ENCOUNTER — Encounter: Payer: Self-pay | Admitting: *Deleted

## 2010-11-23 LAB — CK TOTAL AND CKMB (NOT AT ARMC)
CK, MB: 2.8 ng/mL (ref 0.3–4.0)
Relative Index: 1 (ref 0.0–2.5)
Total CK: 277 U/L — ABNORMAL HIGH (ref 7–232)

## 2010-11-26 ENCOUNTER — Other Ambulatory Visit: Payer: Self-pay | Admitting: Internal Medicine

## 2010-11-27 NOTE — Telephone Encounter (Signed)
[address] °

## 2010-11-29 ENCOUNTER — Other Ambulatory Visit: Payer: Self-pay | Admitting: *Deleted

## 2010-11-29 MED ORDER — POTASSIUM CHLORIDE CRYS ER 20 MEQ PO TBCR: 20.0000 meq | EXTENDED_RELEASE_TABLET | Freq: Every day | ORAL | Status: DC

## 2010-12-03 ENCOUNTER — Encounter: Payer: Self-pay | Admitting: Internal Medicine

## 2010-12-03 ENCOUNTER — Ambulatory Visit (INDEPENDENT_AMBULATORY_CARE_PROVIDER_SITE_OTHER): Payer: Medicaid Other | Admitting: Internal Medicine

## 2010-12-03 VITALS — BP 122/88 | HR 64 | Ht 71.0 in | Wt 272.0 lb

## 2010-12-03 DIAGNOSIS — G4733 Obstructive sleep apnea (adult) (pediatric): Secondary | ICD-10-CM

## 2010-12-03 DIAGNOSIS — F1010 Alcohol abuse, uncomplicated: Secondary | ICD-10-CM

## 2010-12-03 DIAGNOSIS — E0590 Thyrotoxicosis, unspecified without thyrotoxic crisis or storm: Secondary | ICD-10-CM

## 2010-12-03 DIAGNOSIS — I4891 Unspecified atrial fibrillation: Secondary | ICD-10-CM

## 2010-12-03 DIAGNOSIS — I498 Other specified cardiac arrhythmias: Secondary | ICD-10-CM

## 2010-12-03 DIAGNOSIS — I5042 Chronic combined systolic (congestive) and diastolic (congestive) heart failure: Secondary | ICD-10-CM

## 2010-12-03 DIAGNOSIS — I495 Sick sinus syndrome: Secondary | ICD-10-CM

## 2010-12-03 MED ORDER — DABIGATRAN ETEXILATE MESYLATE 150 MG PO CAPS: 150.0000 mg | ORAL_CAPSULE | Freq: Two times a day (BID) | ORAL | Status: DC

## 2010-12-03 NOTE — Assessment & Plan Note (Signed)
ETOH is a definite precipitant for afib and also a cardiotoxin.  I have recommended complete ETOH cessation at this time.

## 2010-12-03 NOTE — Assessment & Plan Note (Signed)
Normal pacemaker function today. No device changes.

## 2010-12-03 NOTE — Assessment & Plan Note (Signed)
The patient is euvolemic today.  No changes. EF worsened as of 1/12.  This may be related to afib with tachyarrhythmias vs ETOH.  I have recommended complete ETOH cessation.  Once his atrial arrhythmias are improved and medicines are optimized, we should repeat EF.

## 2010-12-03 NOTE — Patient Instructions (Signed)

## 2010-12-03 NOTE — Assessment & Plan Note (Signed)
" >>  ASSESSMENT AND PLAN FOR COMBINED HEART FAILURE, CHRONIC WRITTEN ON 12/03/2010  4:47 PM BY [name], MD  The patient is euvolemic today.  No changes. EF worsened as of 1/12.  This may be related to afib with tachyarrhythmias vs ETOH.  I have recommended complete ETOH cessation.  Once his atrial arrhythmias are improved and medicines are optimized, we should repeat EF. "

## 2010-12-03 NOTE — Assessment & Plan Note (Signed)
Pt recently seen by Dr Everardo All.  He is on methimazole chronically. No changes today.

## 2010-12-03 NOTE — Progress Notes (Signed)
[name] is a pleasant 50 yo AAM with a h/o paroxysmal atrial fibrillation, sinus node dysfunction s/p PPM, and chronic systolic dysfunction (nonischemic) who presents today for further evaluation of afib.  He reports initially being diagnosed with atrial fibrillation in 2004.  He was initially lost to follow-up but subsequently returned to Dr [name] care.  He presented in 2009 with afib with RVR and post termination pauses and junctional rhythm and therefore had a single chamber (AAI pacemaker implanted by Dr [name]).  He returned 1/10 and was placed on tikosyn.  He reports that his afib was well controlled with tikosyn.  Unfortunately, his afib burden has recently increased despite medical therapy with tikosyn.  Though he denies palpitations, he finds that he is more fatigued and has decreased exercise tolerance.  He reports that this is consistent with his prior afib symptoms.   Today, he denies symptoms of palpitations, chest pain, shortness of breath, orthopnea, PND, lower extremity edema, dizziness, presyncope, syncope, or neurologic sequela. The patient is tolerating medications without difficulties and is otherwise without complaint today.  Past Medical History  Diagnosis Date  . Depression   . GERD (gastroesophageal reflux disease)   . HTN (hypertension)   . Obesity   . CAD (coronary artery disease)     S/P nstemi and cath 05/2008 - nonobs. dzs.  . Pulmonary embolism 04/2003    Hx of  . Atrial fibrillation   . Sinus node dysfunction     s/p PPM  . Hyperlipemia   . MI (myocardial infarction)   . Pacemaker     Medtronic  . CHF (congestive heart failure)     EF 35% by most recent echo 09/19/10  . Systolic and diastolic CHF, chronic   . PUD (peptic ulcer disease)     gastritis due to ETOH previously  . Sleep apnea     uses CPAP  . DDD (degenerative disc disease)     evaluate by neurosurgery is ongoing  . Hyperthyroidism     Graves dz on methimazole and followed by Dr [name]  .  Pulmonary embolism 2004    previously on coumadin  . Polysubstance abuse     cocaine, last used 1999   Past Surgical History  Procedure Date  . Pacemaker insertion 10/09    by Dr [name]  . Fingers removed from right hand. 2/84    traumatic work injury  . Lumbar spine surgery 02/2009    Dr. Dutch [name]    Current Outpatient Prescriptions  Medication Sig Dispense Refill  . aspirin 325 MG tablet Take 325 mg by mouth daily.        [name] azelastine (ASTEPRO) 137 MCG/SPRAY nasal spray 1 spray by Nasal route 2 (two) times daily at 10 AM and 5 PM. Use in each nostril as directed       . carvedilol (COREG) 25 MG tablet Take 25 mg by mouth 2 (two) times daily with a meal.        . docusate sodium (COLACE) 100 MG capsule Take 100 mg by mouth 2 (two) times daily.        [name] dofetilide (TIKOSYN) 500 MCG capsule Take 500 mcg by mouth 2 (two) times daily.        . hydrocortisone (ANUSOL-HC) 25 MG suppository Place 25 mg rectally 2 (two) times daily.        [name] KLOR-CON M20 20 MEQ tablet TAKE 1 TABLET BY MOUTH EVERY DAY  30 tablet  3  . losartan (COZAAR) 50  MG tablet Take 50 mg by mouth daily.        . methimazole (TAPAZOLE) 10 MG tablet Take 10 mg by mouth daily.        . nitroGLYCERIN (NITROSTAT) 0.4 MG SL tablet Place 0.4 mg under the tongue every 5 (five) minutes as needed.        . simvastatin (ZOCOR) 40 MG tablet Take 40 mg by mouth at bedtime.        . vardenafil (LEVITRA) 20 MG tablet Take 20 mg by mouth daily as needed.        . Wheat Dextrin (BENEFIBER DRINK MIX PO) Take by mouth as directed.        [name] DISCONTD: HYDROcodone-acetaminophen (NORCO) 5-325 MG per tablet Take 1 tablet by mouth every 6 (six) hours as needed.        [name] omeprazole (PRILOSEC) 40 MG capsule Take 40 mg by mouth daily.        [name] DISCONTD: polyethylene glycol (MIRALAX / GLYCOLAX) packet Take 17 g by mouth daily.        [name] DISCONTD: potassium chloride SA (KLOR-CON M20) 20 MEQ tablet Take 1 tablet (20 mEq total) by mouth daily.  30 tablet   11    Allergies  Allergen Reactions  . Celebrex (Celecoxib)   . Hydrocodone   . Prednisolone     History   Social History  . Marital Status: Legally Separated    Spouse Name: N/A    Number of Children: N/A  . Years of Education: N/A   Occupational History  . Not on file.   Social History Main Topics  . Smoking status: Former Smoker    Quit date: 04/04/2003  . Smokeless tobacco: Not on file  . Alcohol Use: Yes     formerly heavy ETOH,  now drinks a fifth of liquor every 2 weeks  . Drug Use: Yes     previously used crack cocaine, quit 1999,  + marijuana  . Sexually Active: Not on file   Other Topics Concern  . Not on file   Social History Narrative   Unemployed currently.  Previously worked as a Curator.  Lives in [place].    Family History  Problem Relation Age of Onset  . Heart disease Mother   . Breast cancer Mother   . Liver cancer Father   . Diabetes Other     ROS- [name] systems are reviewed and negative except as per the HPI above  Physical Exam: Filed Vitals:   12/03/10 1449  BP: 122/88  Pulse: 64  Height: 5\' 11"  (1.803 m)  Weight: 272 lb (123.378 kg)    GEN- The patient is well appearing, alert and oriented x 3 today.   Head- normocephalic, atraumatic Eyes-  Sclera clear, conjunctiva pink Ears- hearing intact Oropharynx- clear Neck- supple, no JVP Lymph- no cervical lymphadenopathy Lungs- Clear to ausculation bilaterally, normal work of breathing Heart- Regular rate and rhythm, no murmurs, rubs or gallops, PMI not laterally displaced GI- soft, NT, ND, + BS Extremities- no clubbing, cyanosis, or edema MS- no significant deformity or atrophy Skin- no rash or lesion Psych- euthymic mood, full affect Neuro- strength and sensation are intact  EKG-  10/16/10 shows atrial pacing at 60 bpm with PACs/PVCs, PR 144, nonspecific ST/T changes  Cardiac Cath  Procedure date:  06/22/2008  Findings:      IMPRESSION:   1. Scattered diffuse  coronary irregularities without critical stenosis.   2. Delayed filling of the distal left  anterior descending, which may be related to the patient's Cardizem.   3. Paroxysmal atrial fibrillation with intermittent conversion during the course of the procedure.   Echo 09/19/10   - Left ventricle: There is septal hypokinesis and inferior hypokinesis. Some of this may be related to the pacing function.     The EF is 35%. The cavity size was mildly dilated. Wall thickness was increased in a pattern of mild LVH.   - Right ventricle: The cavity size was mildly dilated. Systolic function was mildly reduced.   - Right atrium: The atrium was mildly dilated.   - LA size 40 mm, LVEDD 57mm  Assessment and Plan:

## 2010-12-03 NOTE — Assessment & Plan Note (Signed)
The patient has symptomatic paroxysmal atrial fibrillation.  I have reviewed his PPM interrogation today which reveals afib 33% of the time.  This likely is the source for his recent fatigue and decreased exercise tolerance.  He continues to drink ETOH and complete cessation is advised today.  He has failed medical therapy with beta blockers and tikosyn.  Therapeutic strategies for afib including medicine and ablation were discussed in detail with the patient today. Risk, benefits, and alternatives to EP study and radiofrequency ablation for afib were also discussed in detail today. These risks include but are not limited to stroke, bleeding, vascular damage, tamponade, perforation, damage to the esophagus, lungs, and other structures, pulmonary vein stenosis, worsening renal function, lead dislodgement requiring pacemaker revision and death. The patient understands these risk and wishes to proceed.  We will initiate pradaxa 150mg  BID today.  (we will confirm CrCl with BMET today and check CBC).  We will therefore proceed with catheter ablation once the patient has been adequately anticoagulated.

## 2010-12-03 NOTE — Assessment & Plan Note (Signed)
Compliance with CPAP advised 

## 2010-12-04 LAB — CBC WITH DIFFERENTIAL/PLATELET
Eosinophils Relative: 3.4 % (ref 0.0–5.0)
HCT: 42.4 % (ref 39.0–52.0)
Monocytes Relative: 8.5 % (ref 3.0–12.0)
Neutrophils Relative %: 39.9 % — ABNORMAL LOW (ref 43.0–77.0)
Platelets: 245 10*3/uL (ref 150.0–400.0)
WBC: 6.6 10*3/uL (ref 4.5–10.5)

## 2010-12-04 LAB — BASIC METABOLIC PANEL
BUN: 20 mg/dL (ref 6–23)
Creatinine, Ser: 1.1 mg/dL (ref 0.4–1.5)
GFR: 74.11 mL/min (ref >60.00)
Potassium: 4.3 mEq/L (ref 3.5–5.1)

## 2010-12-07 LAB — URINALYSIS, ROUTINE W REFLEX MICROSCOPIC
Leukocytes, UA: NEGATIVE
Nitrite: NEGATIVE
Specific Gravity, Urine: 1.034 — ABNORMAL HIGH (ref 1.005–1.030)
Urobilinogen, UA: 1 mg/dL (ref 0.0–1.0)
pH: 5.5 (ref 5.0–8.0)

## 2010-12-07 LAB — BASIC METABOLIC PANEL
CO2: 24 mEq/L (ref 19–32)
Chloride: 105 mEq/L (ref 96–112)
GFR calc Af Amer: >60 mL/min (ref >60)
Potassium: 4.2 mEq/L (ref 3.5–5.1)
Sodium: 134 mEq/L — ABNORMAL LOW (ref 135–145)

## 2010-12-07 LAB — CBC
HCT: 43.8 % (ref 39.0–52.0)
HCT: 47.6 % (ref 39.0–52.0)
Hemoglobin: 14.6 g/dL (ref 13.0–17.0)
Hemoglobin: 14.6 g/dL (ref 13.0–17.0)
Hemoglobin: 15.7 g/dL (ref 13.0–17.0)
MCHC: 32.5 g/dL (ref 30.0–36.0)
MCV: 82.1 fL (ref 78.0–100.0)
MCV: 82.3 fL (ref 78.0–100.0)
MCV: 82.4 fL (ref 78.0–100.0)
Platelets: 262 10*3/uL (ref 150–400)
RBC: 5.48 MIL/uL (ref 4.22–5.81)
RBC: 5.78 MIL/uL (ref 4.22–5.81)
RDW: 16.7 % — ABNORMAL HIGH (ref 11.5–15.5)
WBC: 4.7 10*3/uL (ref 4.0–10.5)

## 2010-12-07 LAB — HEPATIC FUNCTION PANEL
ALT: 57 U/L — ABNORMAL HIGH (ref 0–53)
Alkaline Phosphatase: 65 U/L (ref 39–117)
Bilirubin, Direct: 0.3 mg/dL (ref 0.0–0.3)
Indirect Bilirubin: 0.4 mg/dL (ref 0.3–0.9)

## 2010-12-07 LAB — DIFFERENTIAL
Basophils Absolute: 0 10*3/uL (ref 0.0–0.1)
Basophils Relative: 1 % (ref 0–1)
Eosinophils Absolute: 0.2 10*3/uL (ref 0.0–0.7)
Eosinophils Absolute: 0.3 10*3/uL (ref 0.0–0.7)
Eosinophils Absolute: 0.2 10*3/uL (ref 0.0–0.7)
Eosinophils Relative: 3 % (ref 0–5)
Eosinophils Relative: 4 % (ref 0–5)
Lymphocytes Relative: 42 % (ref 12–46)
Lymphs Abs: 2.2 10*3/uL (ref 0.7–4.0)
Monocytes Absolute: 0.5 10*3/uL (ref 0.1–1.0)
Monocytes Absolute: 0.7 10*3/uL (ref 0.1–1.0)
Monocytes Absolute: 0.4 10*3/uL (ref 0.1–1.0)
Monocytes Relative: 10 % (ref 3–12)
Monocytes Relative: 8 % (ref 3–12)
Neutrophils Relative %: 41 % — ABNORMAL LOW (ref 43–77)

## 2010-12-07 LAB — POCT I-STAT, CHEM 8
Calcium, Ion: 1.14 mmol/L (ref 1.12–1.32)
Chloride: 107 mEq/L (ref 96–112)
HCT: 44 % (ref 39.0–52.0)
Sodium: 138 mEq/L (ref 135–145)
TCO2: 24 mmol/L (ref 0–100)

## 2010-12-07 LAB — URINE MICROSCOPIC-ADD ON

## 2010-12-07 LAB — TYPE AND SCREEN: ABO/RH(D): A POS

## 2010-12-07 LAB — POCT CARDIAC MARKERS
CKMB, poc: 1.1 ng/mL (ref 1.0–8.0)
Myoglobin, poc: 143 ng/mL (ref 12–200)
Troponin i, poc: <0.05 ng/mL (ref 0.00–0.09)

## 2010-12-07 LAB — ABO/RH: ABO/RH(D): A POS

## 2010-12-11 ENCOUNTER — Other Ambulatory Visit (INDEPENDENT_AMBULATORY_CARE_PROVIDER_SITE_OTHER): Payer: Medicaid Other

## 2010-12-11 ENCOUNTER — Encounter: Payer: Self-pay | Admitting: Gastroenterology

## 2010-12-11 ENCOUNTER — Telehealth: Payer: Self-pay | Admitting: Internal Medicine

## 2010-12-11 ENCOUNTER — Ambulatory Visit (INDEPENDENT_AMBULATORY_CARE_PROVIDER_SITE_OTHER): Payer: Medicaid Other | Admitting: Gastroenterology

## 2010-12-11 VITALS — BP 124/76 | HR 80 | Ht 71.0 in | Wt 269.0 lb

## 2010-12-11 DIAGNOSIS — K602 Anal fissure, unspecified: Secondary | ICD-10-CM

## 2010-12-11 LAB — IBC PANEL
Iron: 76 ug/dL (ref 42–165)
Saturation Ratios: 18.9 % — ABNORMAL LOW (ref 20.0–50.0)
Transferrin: 286.5 mg/dL (ref 212.0–360.0)

## 2010-12-11 LAB — CBC WITH DIFFERENTIAL/PLATELET
Basophils Absolute: 0 10*3/uL (ref 0.0–0.1)
Basophils Relative: 0.3 % (ref 0.0–3.0)
Eosinophils Absolute: 0 10*3/uL (ref 0.0–0.7)
Eosinophils Relative: 0.4 % (ref 0.0–5.0)
HCT: 45.1 % (ref 39.0–52.0)
Hemoglobin: 15 g/dL (ref 13.0–17.0)
Lymphocytes Relative: 27.3 % (ref 12.0–46.0)
Lymphs Abs: 2.7 10*3/uL (ref 0.7–4.0)
MCHC: 33.2 g/dL (ref 30.0–36.0)
MCV: 82.7 fl (ref 78.0–100.0)
Monocytes Absolute: 0.8 10*3/uL (ref 0.1–1.0)
Monocytes Relative: 8.3 % (ref 3.0–12.0)
Neutro Abs: 6.3 10*3/uL (ref 1.4–7.7)
Neutrophils Relative %: 63.7 % (ref 43.0–77.0)
Platelets: 237 10*3/uL (ref 150.0–400.0)
RBC: 5.45 Mil/uL (ref 4.22–5.81)
RDW: 16.8 % — ABNORMAL HIGH (ref 11.5–14.6)
WBC: 10 10*3/uL (ref 4.5–10.5)

## 2010-12-11 LAB — FERRITIN: Ferritin: 221.2 ng/mL (ref 22.0–322.0)

## 2010-12-11 MED ORDER — AMBULATORY NON FORMULARY MEDICATION: Status: DC

## 2010-12-11 NOTE — Patient Instructions (Signed)
We have printed your Diltiazem rx for you to take to [facility]. You were given a handout on anal fissures today. Please go to the basement today for your labs.  We will contact you about your appt with [facility].

## 2010-12-11 NOTE — Telephone Encounter (Signed)
1. Pt has many issues to discuss today.  He has not started Pradaxa as per last ov with Dr. Johney Frame 12/03/10.  He did see his neurosurgeon and was given percocet.  He wanted to know if he could take Percocet and Pradaxa.  I checked with pharmacist and no contraindications.  Pt aware.  Pt reports he is not ETOH with percocet. Also instructed not to drive while taking Percocet.  Pt understands. 2. Pt has received steroid injection in shoulder for bursitis by Sports Medicine MD.  He is also seeing his GI doctor today for bleeding hemorrhoids. 3. He will talk with GI MD regarding starting Pradaxa as he reports bleeding hemorrhoids.  4.  If he does start the Pradaxa he will call back and pick up the Pradaxa assistance program form to complete.  If he cannot get assistance for Pradaxa then he states he would do the Coumadin.  5.  "Just call it in to [facility]"  I told pt he would have to be evaluated by the coumadin clinic first if this was the route he chose. I will forward this to Dr. Johney Frame for review.  At this time, pt has not started Pradaxa. Mylo Red RN

## 2010-12-11 NOTE — Telephone Encounter (Signed)
Pt states his insurance is not paying for the pradaxa. Pt wants to know if their something else he can take.

## 2010-12-11 NOTE — Progress Notes (Signed)
This is a 50 year old African American male with chronic rectal pain chronic rectal bleeding. He has long history of current anal fissures unresponsive to medical therapy. Also has serious cardiovascular issues with upcoming scheduled cardiac catheterization and initiation of Pradaxa therapy.  Current Medications, Allergies, Past Medical History, Past Surgical History, Family History and Social History were reviewed in [employer] record.  Pertinent Review of Systems Negative   Physical Exam: Abdominal exam is unremarkable without organomegaly, masses or tenderness.Inspection of the rectum is unremarkable. There is a thickening posteriorly consistent with a chronic fissure. Does guaiac negative without fresh blood in the rectal vault.   Assessment and Plan: Local diltiazem 10% gel 4- 5 times a day as tolerated. I referred him to Dr. Harriette Bouillon in surgery for consideration of rectal sphincterotomy. CBC has been ordered for review. I see no reason why he could not begin anticoagulation therapy. Encounter Diagnosis  Name Primary?  [name] Anal fissure Yes

## 2010-12-15 LAB — BASIC METABOLIC PANEL
CO2: 24 mEq/L (ref 19–32)
Chloride: 102 mEq/L (ref 96–112)
Chloride: 103 mEq/L (ref 96–112)
GFR calc Af Amer: >60 mL/min (ref >60)
GFR calc Af Amer: >60 mL/min (ref >60)
GFR calc non Af Amer: >60 mL/min (ref >60)
Potassium: 3.7 mEq/L (ref 3.5–5.1)
Potassium: 4 mEq/L (ref 3.5–5.1)
Sodium: 134 mEq/L — ABNORMAL LOW (ref 135–145)
Sodium: 137 mEq/L (ref 135–145)
Sodium: 140 mEq/L (ref 135–145)

## 2010-12-16 LAB — COMPREHENSIVE METABOLIC PANEL
ALT: 30 U/L (ref 0–53)
Albumin: 3.7 g/dL (ref 3.5–5.2)
Alkaline Phosphatase: 88 U/L (ref 39–117)
Chloride: 104 mEq/L (ref 96–112)
Glucose, Bld: 87 mg/dL (ref 70–99)
Potassium: 4 mEq/L (ref 3.5–5.1)
Sodium: 135 mEq/L (ref 135–145)
Total Bilirubin: 0.3 mg/dL (ref 0.3–1.2)
Total Protein: 6.5 g/dL (ref 6.0–8.3)

## 2010-12-16 LAB — CBC
HCT: 43.4 % (ref 39.0–52.0)
Hemoglobin: 14.5 g/dL (ref 13.0–17.0)
MCHC: 33.4 g/dL (ref 30.0–36.0)
RBC: 5.47 MIL/uL (ref 4.22–5.81)
RDW: 16.9 % — ABNORMAL HIGH (ref 11.5–15.5)

## 2010-12-16 LAB — CK TOTAL AND CKMB (NOT AT ARMC): CK, MB: 1.7 ng/mL (ref 0.3–4.0)

## 2010-12-16 LAB — TROPONIN I: Troponin I: <0.01 ng/mL (ref 0.00–0.06)

## 2010-12-18 ENCOUNTER — Telehealth: Payer: Self-pay | Admitting: *Deleted

## 2010-12-18 ENCOUNTER — Telehealth: Payer: Self-pay | Admitting: Internal Medicine

## 2010-12-18 NOTE — Telephone Encounter (Signed)
[phone]-heart cath questions

## 2010-12-18 NOTE — Telephone Encounter (Signed)
Dr Jarold Motto wanted to refer patient to [facility] but he has [facility] [place] access so his primary care must refer him I have sent all office visit and this note to them and he will contact his primary care to get the appt with [facility].

## 2010-12-19 NOTE — Telephone Encounter (Signed)
We have changed his ablation to later in May  He will have his lab work on 01/09/11 I have gotten his Pradaxa approved for 12 months Pt aware

## 2010-12-20 ENCOUNTER — Encounter: Payer: Self-pay | Admitting: *Deleted

## 2010-12-22 ENCOUNTER — Telehealth: Payer: Self-pay | Admitting: *Deleted

## 2010-12-22 NOTE — Telephone Encounter (Signed)
TEE scheduled for 12/30/10 at 10:00am with Dr Gala Romney  Check in at short stay at 9:00

## 2010-12-30 HISTORY — PX: ATRIAL ABLATION SURGERY: SHX560

## 2011-01-06 DIAGNOSIS — M5137 Other intervertebral disc degeneration, lumbosacral region: Secondary | ICD-10-CM | POA: Insufficient documentation

## 2011-01-06 DIAGNOSIS — E0500 Thyrotoxicosis with diffuse goiter without thyrotoxic crisis or storm: Secondary | ICD-10-CM | POA: Insufficient documentation

## 2011-01-06 DIAGNOSIS — I482 Chronic atrial fibrillation, unspecified: Secondary | ICD-10-CM | POA: Insufficient documentation

## 2011-01-06 DIAGNOSIS — M51379 Other intervertebral disc degeneration, lumbosacral region without mention of lumbar back pain or lower extremity pain: Secondary | ICD-10-CM | POA: Insufficient documentation

## 2011-01-09 ENCOUNTER — Other Ambulatory Visit (INDEPENDENT_AMBULATORY_CARE_PROVIDER_SITE_OTHER): Payer: Medicaid Other | Admitting: *Deleted

## 2011-01-09 ENCOUNTER — Other Ambulatory Visit: Payer: Medicaid Other | Admitting: *Deleted

## 2011-01-09 DIAGNOSIS — I4891 Unspecified atrial fibrillation: Secondary | ICD-10-CM

## 2011-01-09 LAB — BASIC METABOLIC PANEL
CO2: 27 mEq/L (ref 19–32)
Calcium: 9.2 mg/dL (ref 8.4–10.5)
Creatinine, Ser: 1.2 mg/dL (ref 0.4–1.5)
GFR: 82.72 mL/min (ref >60.00)
Glucose, Bld: 94 mg/dL (ref 70–99)

## 2011-01-09 LAB — CBC WITH DIFFERENTIAL/PLATELET
Basophils Absolute: 0 10*3/uL (ref 0.0–0.1)
Eosinophils Absolute: 0.2 10*3/uL (ref 0.0–0.7)
Lymphocytes Relative: 44.1 % (ref 12.0–46.0)
MCHC: 33.1 g/dL (ref 30.0–36.0)
Neutrophils Relative %: 43.2 % (ref 43.0–77.0)
RDW: 17 % — ABNORMAL HIGH (ref 11.5–14.6)

## 2011-01-13 ENCOUNTER — Encounter: Payer: Self-pay | Admitting: Internal Medicine

## 2011-01-13 NOTE — Assessment & Plan Note (Signed)
[facility]                            CARDIOLOGY OFFICE NOTE   NAME:[name], [name]                   MRN:          [id number]  DATE:02/08/2008                            DOB:          1961-03-27    PRIMARY CARDIOLOGIST:  Duke Salvia, MD, [facility]   GI:  Vania Rea. Jarold Motto, MD, [facility], FAGA   HISTORY OF PRESENT ILLNESS:  [name] is a 50 year old African  American male patient who was referred to [name] by Dr. Freida Busman from [facility] Emergency Room.  The patient presented there on Jan 24, 2008, with  chest pain and ruled out for an MI.  He had a CT scan that showed no  pulmonary embolus, but some coronary artery calcification.  Admission  was advised, but the patient refused.   He was seen by [name] back in 2005, at which time he had a heart cath for  recurrent chest pain.  Ejection fraction was 40% to 45%, and he had  nonobstructive disease.  Followup 2D echo was performed and showed  normal LV function.  He also has a history of atrial tachycardia and  sinus node dysfunction with bradycardia.  Dr. Graciela Husbands saw him in 2006, at  which time he was having chest pain and prescribed Protonix.   The patient complains of 20-month history of chest tightness that  radiates into his back.  It is nearly constant, sometimes eases a little  bit, but never goes away.  It worsens with activity.  He stated he was  [place] and he had to bend over and  rest, because he had the tightness and shortness of breath.  He has  occasional palpitations when he lies down in the evening, but does not  feel any palpitations during the day.  In the office today, he is in  atrial fibrillation, which is new for him.  EKG from the ER showed  normal sinus rhythm on Jan 24, 2008.   The patient also had recent endoscopy and colonoscopy by Dr. Jarold Motto  in March and was found to have esophagitis, reflux, GERD, and hiatal  hernia.   PAST MEDICAL  HISTORY:  1. Significant for pulmonary embolus.  2. Gastroesophageal reflux disease.   SOCIAL HISTORY:  He has his own business.  He quit smoking in 2004.  He  smoked for over 20 years.  He occasionally drinks alcohol socially.   FAMILY HISTORY:  Mother is 12, alive and well.  He never knew his  father.  He has 3 sisters and 1 brother, alive and well.  He has 3  uncles that had heart attack.   CURRENT MEDICATIONS:  Zantac 150 mg daily.   PHYSICAL EXAMINATION:  This is a pleasant 50 year old African American  male patient complaining of chest pain.  Blood pressure is 151/105, pulse 104, and weight 251.  NECK:  Without JVD, HJR, bruit, or thyroid enlargement.  LUNGS:  Decreased breath sounds, but clear anterior, posterior, and  lateral.  HEART:  Irregularly irregular at 104 beats per minute.  Normal S1 and  S2.  No murmur, rub, bruit, thrill, or heave noted.  ABDOMEN:  Obese.  Normoactive bowel sounds are heard throughout.  EXTREMITIES:  Without cyanosis, clubbing, or edema.  He has good distal  pulses.   IMPRESSION:  1. Chest pain, rule out ischemia.  2. Atrial fibrillation, presumably new.  3. History of nonobstructive coronary artery disease on cath in 2005.  4. History of atrial tachycardia and sinus node dysfunction with      bradycardia.  5. History of previous pulmonary embolus in 2005 with negative CT on      Jan 24, 2008.  6. [place] score of 1.  7. Hypertension.   PLAN:  At this time, we have recommended that the patient be admitted to  the hospital for further evaluation of both atrial fibrillation and his  chest pain.  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  Because of this, Dr. Diona Browner discussed other options  with the patient and has ordered a stress Myoview as well as echo to be  done in the next day.  We will order CK-MBs and troponins as well as  fasting lipid panel and TSH.  He will be scheduled soon back in the  office for  review.  He was encouraged to present to the ER if his  symptoms worsened in the interim.      Jacolyn Reedy, PA-C  Electronically Signed      Jonelle Sidle, MD  Electronically Signed   ML/MedQ  DD: 02/08/2008  DT: 02/08/2008  Job #: [id number]

## 2011-01-13 NOTE — Discharge Summary (Signed)
[name], [name] NO.:  [id number]   MEDICAL RECORD NO.:  [id number]          PATIENT TYPE:  INP   LOCATION:  6527                         FACILITY:  [facility]   PHYSICIAN:  Doylene Canning. Ladona Ridgel, MD    DATE OF BIRTH:  1960/10/06   DATE OF ADMISSION:  06/20/2008  DATE OF DISCHARGE:  06/23/2008                               DISCHARGE SUMMARY   DISCHARGING PHYSICIAN:  Doylene Canning. Ladona Ridgel, MD   PRIMARY CARDIOLOGIST:  Duke Salvia, MD, [facility].  The patient is also  followed in the Congestive Heart Failure Clinic.   PRIMARY CARE PHYSICIAN:  Corwin Levins, MD   DISCHARGE DIAGNOSES:  1. Chest pain status post non-ST elevated myocardial infarction this      admission, questionable layering plaque.  No definite obstruction      to flow.  2. Atrial fibrillation.  The patient in and out of atrial fibrillation      during cardiac catheterization with decreased blood pressure.  The      patient converted with junctional rhythm at Cardizem discontinued      because of bradycardia.  The patient underwent electrophysiology      evaluation.  3. Status post pacemaker implantation with a Medtronic adapter device      secondary to the brady tachycardia and atrial fibrillation with      rapid ventricular response.   past medical history includes:  1. Nonischemic cardiomyopathy with EF of 40-45% per echo in June 2009.  2. Alcohol-induced gastritis.  3. Hiatal hernia.  4. Paroxysmal atrial fib.  5. History of cocaine use.  6. Previous MIs in the past associated with cocaine.  7. History of mild pancreatitis.  8. History of tobacco use.  9. History of bilateral pulmonary emboli in 2004.  10.Untreated dyslipidemia.  11.Peptic ulcer disease.  12.Hypertension.   HOSPITAL COURSE:  [name] is a 50 year old African American  gentleman with a past medical history as stated above, who presented  with complaints of chest discomfort with positive enzymes noted this  admission.  The  patient was seen by Dr. Riley Kill.  He was found to have  atrial fib with a heart rate in the 100s.  His Coreg was increased to  12.5 mg b.i.d., continued ACE inhibitor.  The patient's troponin peaked  at 1.14.  The patient to the cath lab by Dr. Shawnie Pons.  No  interventions at this time; however, the patient in and out of atrial  fib during procedure with hypotension.  The patient converted to a  junctional rhythm on IV Cardizem, signs of bradycardia.  Cardizem was  discontinued.  EP saw the patient in consultation.  The patient a  candidate for a permanent pacemaker.  Dr. Graciela Husbands and the patient  discussed, opted for a pacemaker implantation.  The patient had a  Medtronic ADAPT device placed.  [name] was quite well, was  transferred to the floor in stable condition.  Dr. Ladona Ridgel in to see the  patient on the day of discharge.  Pacer site to left chest dry and  intact.  The patient tolerating medications without problems.  Blood  pressure stable 130/80, heart rate 60.  Cardiac rehab has also evaluated  the patient.  He is a candidate for outpatient rehab.   Medications at the time of discharge include:  1. Metoprolol succinate 25 mg b.i.d.  2. Carvedilol 12.5 mg t.i.d.  3. Benazepril 10 mg daily.  4. Aspirin 325 daily.   I have given him the prescription for nitroglycerin as needed for chest  pain.  He has been given the postcardiac catheterization discharge  instructions along with the post pacemaker discharge instructions.  He  will follow up in the [facility] and with Dr. Graciela Husbands and then  ultimately with me.  Appointments will be arranged by the office on  Monday and they will call the patient at home to notify him of the time.   Duration of discharge encounter over 30 minutes      [name], ACNP      Doylene Canning. Ladona Ridgel, MD  Electronically Signed    MB/MEDQ  D:  06/23/2008  T:  06/23/2008  Job:  [id number]

## 2011-01-13 NOTE — Assessment & Plan Note (Signed)
[facility]                          CHRONIC HEART FAILURE NOTE   NAME:[name], [name]                   MRN:          [id number]  DATE:04/24/2008                            DOB:          1961/08/17    PRIMARY CARDIOLOGIST:  Duke Salvia, MD, [facility]   GASTROENTEROLOGIST:  Vania Rea. Jarold Motto, MD, [facility], FAGA   [name] returns today for further followup of his congestive heart  failure which is secondary to nonischemic cardiomyopathy with EF  currently 40%-45% by echocardiogram in June 2009.  I initially saw Mr.  Delair as a new patient back in July, at which time I initiated his  heart failure education and titrated his carvedilol.  Since that visit,  he returns today.  He states he has been compliant with his medications.  He has experienced some transient orthostatic dizziness in regards to  his increased carvedilol dose.  Otherwise, denies any symptoms  suggestive of volume overload.  We reviewed his diet today.  He has been  eating some microwave dinners that have approximately 800 mg of sodium  in them.  I have cautioned him about keeping his sodium around 2000 mg  daily.  He is also complaining of epigastric pain.  Apparently, his  Zantac was increased to 300 mg b.i.d. by Dr. Graciela Husbands earlier this summer.  He states he has been taking it, but it seems like his hiatal hernia is  bothering him more and he is going to follow up with Dr. Jarold Motto.  Also on reviewing his oral intake, he is also consuming EtOH  occasionally.  He states he had 2 mixed drinks this weekend, but that is  the first time he has had any in the several months.  He denies any  episodes of chest discomfort suggestive of angina.  He continues to  remain class I [place] Heart Association in regards to his symptoms.  He still employed as a Curator.   PAST MEDICAL HISTORY:  1. Nonischemic cardiomyopathy with EF 45%.  2. History of bilateral pulmonary emboli  previously on Coumadin.  3. Chronic atrial fibrillation.  4. History of noncompliance.  5. GERD/hiatal hernia.  6. History of cocaine use greater than 10 years.  7. History of EtOH abuse.  8. Previous myocardial infarction with the patient refusing care.      Eventually, underwent a cardiac catheterization that showed      nonobstructive disease.  9. Mild pancreatitis.  10.Untreated dyslipidemia.  11.History of tobacco use, the patient quit in 2004.   REVIEW OF SYSTEMS:  As stated above.   CURRENT MEDICATIONS:  1. Aspirin 325 daily.  2. Lotensin 10 mg daily.  3. Coreg 6.25 mg b.i.d.  4. Zantac 300 mg b.i.d.   PHYSICAL EXAMINATION:  VITAL SIGNS:  Weight today 249 pounds, which is  consistent; blood pressure 131/89; heart rate 56-62.  GENERAL:  [name] is in no acute distress.  NECK:  No signs of jugular vein distention at 45 degree angle.  LUNGS:  Clear to auscultation bilaterally.  CARDIOVASCULAR:  S1 and S2.  Irregular rhythm, slightly bradycardic.  ABDOMEN:  Soft, nontender, positive bowel sounds.  LOWER EXTREMITIES:  Without clubbing, cyanosis, or edema.  NEUROLOGICAL:  Alert and oriented x3.   CLINICAL DATA:  Most recent blood work checked on March 21, 2008, shows  potassium 4.4, BUN 17, creatinine 1.1.  BNP was 21.   IMPRESSION:  Congestive heart failure secondary to nonischemic  cardiomyopathy without signs of volume overload at this time.  The  patient is complaining of ongoing epigastric discomfort with a history  of alcohol-induced gastritis and hiatal hernia.  The patient will need  to follow up with Dr. Jarold Motto for further evaluation as he cannot tell  any improvement with increased Zantac by Dr. Graciela Husbands.  I cannot titrate  beta-blocker at this time secondary to heart rate.  However, I am going  to go up on Lotensin 20 mg daily.  We will plan on seeing Mr. [employer]  back in a month or so for further medication titration.      Dorian Pod, ACNP   Electronically Signed      Bevelyn Buckles. Bensimhon, MD  Electronically Signed   MB/MedQ  DD: 04/24/2008  DT: 04/25/2008  Job #: [id number]

## 2011-01-13 NOTE — Assessment & Plan Note (Signed)
[facility]                         ELECTROPHYSIOLOGY OFFICE NOTE   NAME:[name], [name]                   MRN:          [id number]  DATE:09/18/2008                            DOB:          Dec 17, 1960    [name] is seen in followup for paroxysmal atrial fibrillation  associated with bradycardia, status post AAI pacemaker implantation.  He  also has a history of nonischemic heart disease and intercurrent non-  STEMI in the early fall.  He has continued not to feel well.   He has had a problem with cough.  This has been ameliorated by Dr.  Candace Cruise modification of his medications and I am not quite sure all what  was done, but he is currently on an ARB and not on an ACE inhibitor.   His thromboembolic risk factors are notable for cardiomyopathy.   He has a remote history of pulmonary emboli for which he took Coumadin  but he is no longer on it.   CURRENT MEDICATIONS:  1. Aspirin 325.  2. Coreg 25 b.i.d.  3. Diovan 160.  4. Simvastatin.   PHYSICAL EXAMINATION:  VITAL SIGNS:  Today, his blood pressure was  107/72 with a pulse of 67 and his weight was 252 which is stable.  NECK:  Veins were flat.  LUNGS:  Clear.  HEART:  Sounds were regular.  EXTREMITIES:  No edema.   Interrogation of his Medtronic Adapta AAI pacemaker demonstrates that he  has been in atrial fibrillation about 40% of the time over the last 6-8  weeks.   IMPRESSION:  1. Atrial fibrillation - Recurrent and paroxysmal.  2. Nonischemic cardiomyopathy.  3. Recent non-ST elevation myocardial infarction.  4. Thromboembolic risk factors notable for cardiomyopathy and not on      Coumadin.  5. Remote history of deep vein thrombosis.   [name] continues not to thrive and he has complaints of fatigue  and exercise intolerance.  With his atrial fibrillation burden  approaching 50%, I wonder to what degree this is contributing especially  in the setting of his  cardiomyopathy.   We have discussed treatment options.  I think at this point it make  sense to put him on antiarrhythmic drug, the options of which would  include Dronedarone, Tikosyn, or amiodarone.  I would prefer the middle  and we have talked about an in-hospital stay of 72 hours to initiate his  Tikosyn.  We will get potassium and magnesium levels today to make sure  that they are okay.   Hopefully, this will contribute to his improved exercise performance.     Duke Salvia, MD, [facility]  Electronically Signed    SCK/MedQ  DD: 09/18/2008  DT: 09/18/2008  Job #: [id number]

## 2011-01-13 NOTE — Assessment & Plan Note (Signed)
[facility]                          CHRONIC HEART FAILURE NOTE   NAME:Stelle, KOLBY SCHARA                   MRN:          [id number]  DATE:07/13/2008                            DOB:          [birth year]    PRIMARY CARDIOLOGIST:  Duke Salvia, MD, [facility]   PRIMARY CARE PHYSICIAN:  Corwin Levins, MD   Mr. Bernasconi returns today for further followup of his congestive heart  failure which is secondary to nonischemic cardiomyopathy status post  recent hospitalization from June 20, 2008, to June 23, 2008.  Mr.  Mateo was admitted during that hospitalization for chest pain status  post non-ST elevated MI.  He underwent cardiac catheterization at that  time that showed no definite obstruction to flow.  More than likely, it  appeared that the mid circumflex probably represent plaque and it was  also overlapped between the proximal LAD and the circumflex, but in  multiple views that appeared to be normal according to Dr. Riley Kill.  He  considered doing an intravascular ultrasound, but the lesion clearly was  not tight.  The patient also went in and out of atrial fibrillation  during the course of the procedure with intermittent bradycardia.  The  patient was evaluated by EP during that hospitalization and underwent  implantation of a single-chamber pacemaker with a Medtronic Adapta  device.  Mr. Tugwell returns today and states he still feels much very  fatigued and has occasional chest discomfort.  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  The patient to follow up with Dr. Graciela Husbands  regarding his limitations.   PAST MEDICAL HISTORY:  1. Congestive heart failure secondary to nonischemic cardiomyopathy      with EF 45%.  2. Atrial fib status post recent implantation of a single-chamber      pacemaker.  3. Status post non-ST elevated MI during this recent  hospitalization      cardiac catheterization obtained, results as stated above.  4. History of cocaine use.  5. GERD.  6. History of alcohol-induced gastritis/epigastric pain  7. Untreated dyslipidemia.  8. History of tobacco use.  9. History of noncompliance.   REVIEW OF SYSTEMS:  As stated above.   Current medications include:  1. Aspirin 325.  2. Lotensin 10 mg daily.  3. Coreg 12.5 b.i.d.  4. Toprol-XL 25 b.i.d.   P.r.n. medications include nitroglycerin.   PHYSICAL EXAMINATION:  GENERAL:  Weight 250 pounds, blood pressure  114/65 with a heart rate of 56.  GENERAL:  Mr. Hosking is in no acute distress.  NECK:  No signs of jugular vein distention at 45-degree angle.  LUNGS:  Clear to auscultation.  CARDIOVASCULAR:  S1 and S2.  Regular rate and rhythm.  Left chest  pacemaker site, the area is dry and intact without redness, erythema, no  palpable hematoma.  ABDOMEN:  Soft, positive bowel sounds, nontender.  EXTREMITIES:  Lower extremities without clubbing, cyanosis, or edema.  NEUROLOGIC:  Alert and oriented x3 with flat effect.   A 12-lead  EKG showing sinus brady at a rate of 58, nonspecific T-wave  inversions noted in the inferolateral leads.   IMPRESSION:  Congestive heart failure secondary to nonischemic  cardiomyopathy without signs of volume overload at this time, and  however, Mr. Lipe continues to complain of some atypical chest  discomfort today and check blood work on him, 12-lead EKG showing no  acute changes.  We will plan on repeating his 2-D echocardiogram  outpatient.      Dorian Pod, ACNP  Electronically Signed      Rollene Rotunda, MD, [facility]  Electronically Signed   MB/MedQ  DD: 07/16/2008  DT: 07/17/2008  Job #: [id number]

## 2011-01-13 NOTE — Cardiovascular Report (Signed)
NAMETEX, CONROY NO.:  [id number]   MEDICAL RECORD NO.:  [id number]          PATIENT TYPE:  INP   LOCATION:  2921                         FACILITY:  [facility]   PHYSICIAN:  Arturo Morton. Riley Kill, MD, FACCDATE OF BIRTH:  31-May-1961   DATE OF PROCEDURE:  06/21/2008  DATE OF DISCHARGE:                            CARDIAC CATHETERIZATION   INDICATIONS:  Mr. Willhite is a 50 year old who has been followed in  Heart Failure Clinic with a nonischemic cardiomyopathy.  He also has a  history of recent atrial fibrillation.  He presented with chest pain,  atrial fib, and positive cardiac enzymes.  He remains somewhat fast and  was placed on a Cardizem drip this morning.  Risks, benefits, and  alternatives were discussed with the patient in detail.  All questions  were answered.  He was brought to the cath lab for further evaluation.   PROCEDURE:  Selective coronary arteriography.   DESCRIPTION OF PROCEDURE:  The patient was brought to the  catheterization laboratory and prepped and draped in the usual fashion.  He was on a nitroglycerin drip as well as diltiazem.  His heparin had  been discontinued.  Through an anterior puncture, the right femoral  artery was easily entered.  Following this, we did do views of the left  and right coronary arteries.  We had to exchange for a 3.5 catheter.  During the course of the procedure, the patient kept going from atrial  fibrillation with controlled ventricular response, to a junctional  rhythm with sinus rhythm.  He would go in and out of sinus rhythm and at  times, his pressure would be low associated with this.  As a result, we  elected not to do left ventriculography, and did terminate the  procedure.  I asked Dr. Gala Romney and Dr. Excell Seltzer to come to the  laboratory and review the films with me.  We discussed the films in  detail, and subsequently, I also spoke with Dr. Graciela Husbands.  There were no  major complications.  He was taken to the  holding area in satisfactory  clinical condition.  His diltiazem drip was decreased to 2 mg an hour in  the laboratory.   HEMODYNAMIC DATA:  Initial central aortic pressure was 102/82 with a  mean of 92.   ANGIOGRAPHIC DATA:  1. The right coronary artery is a vessel that demonstrates some      diffuse plaquing.  There is about 20% narrowing proximally and 30%      after an RV branch.  The PDA sort of tapers distally, and it is      hard to distinguish between diffuse distal disease, and small      caliber vessel.  There is a right ventricular branch that has 90%      proximal narrowing, and then a 90% mid narrowing.  It does not      appear to supply left ventricular myocardium.  2. The left main appears free of critical disease.  On the last image,      there is overlapping of the circumflex and LAD takeoff, but there  does not appear to be significant obstruction.  The LAD down to the      apex fills somewhat slowly.  The mid LAD has some diffuse plaque of      about 30-40%, and has demonstrated there is some diminished and      delayed flow distally.  3. The circumflex proper demonstrates some segmental narrowing in the      mid marginal.  It is a fairly large vessel.  We have reviewed the      films to try to distinguish whether this represented a plaque or      whether it could represent layering thrombus.  Speculation was that      probably represented plaque after multiple views, but some layering      of thrombus could not be absolutely excluded.  This is particularly      in light of the fact that he appears to be going in and out of      atrial fibrillation, and there is potential for distal      embolization.   IMPRESSION:  1. Scattered diffuse coronary irregularities without critical      stenosis.  2. Delayed filling of the distal left anterior descending, which may      be related to the patient's Cardizem.  3. Paroxysmal atrial fibrillation with intermittent  conversion during      the course of the procedure.   DISPOSITION:  We have reviewed the films very carefully.  More than  likely, it appears that the mid circumflex probably represents plaque  and it was also overlapped between the proximal LAD and circumflex, but  in multiple views, this appears to be normal.  We had considered  possibly doing intravascular ultrasound, but the lesion clearly is not  tight.  In addition, the patient went in and out of atrial fibrillation  during the course the procedure, with intermittent bradycardia.  As a  result, we elected to terminate the procedure.  We have discussed  various options or leaning would be in the direction of  putting in a  permanent pacemaker and putting him on an antiarrhythmic drug therapy.  We will order a 2-D echo today to get a better idea of wall motion.      Arturo Morton. Riley Kill, MD, [facility]  Electronically Signed     TDS/MEDQ  D:  06/21/2008  T:  06/22/2008  Job:  [id number]   cc:   Duke Salvia, MD, [facility]  CV Laboratory  Rollene Rotunda, MD, [facility]

## 2011-01-13 NOTE — Assessment & Plan Note (Signed)
[facility]                          CHRONIC HEART FAILURE NOTE   NAME:[name], [name]                   MRN:          [id number]  DATE:05/31/2008                            DOB:          1961/04/08    PRIMARY CARDIOLOGIST:  Duke Salvia, MD, [facility]   GASTROENTEROLOGY:  Vania Rea. Jarold Motto, MD, [facility], FAGA   [name] returns today for further followup of his congestive heart  failure, which is secondary to nonischemic cardiomyopathy with EF  currently 40-45% by echocardiogram in June 2009.  I last saw [name] approximately 1 month ago, at which time he was stable.  He is  complaining of ongoing epigastric discomfort with a history of alcohol-  induced gastritis and hiatal hernia.  Pending followup with Dr.  Jarold Motto.  I did not titrate his beta-blocker at that time secondary to  his heart rate.  I did increase his Lotensin 20 mg daily.  [name]  returns today.  He states he was seen in the emergency room on Saturday  night for fast heartbeat.  He has a history of paroxysmal atrial fib.  As stated his primary cardiologist is Berton Mount.  [name] states  he has been having some cough and nasal congestion, had taken some over-  the-counter cough syrup, several doses prior to being seen in the  emergency room at [facility].  According to the ER documentation, I do  not have any actual rhythm strips from the visit.  He was documented  with irregular rhythm and documented with an EKG, atrial fib at a rate  of 99.  Apparently, the ER staff talked with Dr. Daleen Squibb by phone and the  patient was instructed to double his Coreg dose and start diltiazem ER  180 mg daily.  The patient was supposed to follow up with Dr. Graciela Husbands for  further discussion.  [name] states he did not increase carvedilol.  He did not get the prescription for the Cardizem filled either.  He  states he will do weight until he was seen here today for further  discussion.  He denies any episodes of chest discomfort.  He states he  did have some left shoulder discomfort last week and it was worse when  he took a deep breath or moved or changed position.  He states this is  not a problem anymore.  He denies any further episodes of palpitations,  lightheadedness, or dizziness since being discharged home from the ER.   PAST MEDICAL HISTORY:  1. Nonischemic cardiomyopathy with EF 45%.  2. Atrial fib presumably new back in June 2009, at which time, the      patient was instructed to be admitted for further evaluation of his      atrial fib and chest discomfort, but the patient refused.  3. Status post stress Myoview on June 2009 showed EF of 40%, no      significant ST-segment changes suggestive of ischemia, PVCs      including bigeminy noted near peak.  4. History of cocaine use, although recent drug screen negative for  cocaine and was positive for opiates.  5. GERD.  6. History of alcohol-induced gastritis/epigastric pain.  7. Apparent previous myocardial infarction with the patient refusing      care, eventually underwent a cardiac catheterization that showed      nonobstructive disease.  8. History of mild pancreatitis.  9. Untreated dyslipidemia.  10.History of tobacco use.  11.History of noncompliance.   REVIEW OF SYSTEMS:  As stated above.   CURRENT MEDICATIONS:  1. Aspirin 325.  2. Lotensin 10 mg b.i.d.  3. Zantac 300 b.i.d.  4. Carvedilol 6.25 b.i.d.   PHYSICAL EXAMINATION:  VITAL SIGNS:  Weight 249 pounds, blood pressure  118/86 with a heart rate of 60.  GENERAL:  [name] is in no acute distress.  NECK:  No signs of jugular vein distention at 45-degree angle.  LUNGS:  Clear to auscultation bilaterally.  CARDIOVASCULAR:  S1 and S2.  Regular rate and rhythm.  ABDOMEN:  Soft, nontender, positive bowel sounds.  LOWER EXTREMITIES:  Without clubbing, cyanosis, or edema.  NEUROLOGICAL:  Alert and oriented x3 with flat  affect.   CLINICAL DATA:  A 12-lead EKG showing normal sinus rhythm at a rate of  60.   IMPRESSION:  Nonischemic cardiomyopathy without signs of volume  overload.  The patient with recent ER visit for atrial fibrillation  associated with some chest discomfort per the patient's report.  The  patient has not been on Coumadin because of the new onset of atrial  fibrillation.  He has been on Coumadin in the past because of bilateral  pulmonary emboli.  He has a history of ethyl alcohol abuse and confesses  too.  Still consuming some mixed drinks, but is very vague about the  amount and frequency.  I am hesitant to make any changes in his medicine  at this time because of his heart rate.  He needs to follow up with Dr.  Graciela Husbands, his primary cardiologist for further discussion of atrial  fibrillation/anticoagulation therapy.  As for now, [name] has a  CHADS score of 1.  Also he needs to follow up with Dr. Jarold Motto for  ongoing epigastric discomfort.      Dorian Pod, ACNP  Electronically Signed      Rollene Rotunda, MD, [facility]  Electronically Signed   MB/MedQ  DD: 05/31/2008  DT: 06/01/2008  Job #: [id number]

## 2011-01-13 NOTE — Assessment & Plan Note (Signed)
[facility]                         ELECTROPHYSIOLOGY OFFICE NOTE   NAME:[name], [name]                   MRN:          [id number]  DATE:07/25/2007                            DOB:          [birth year]    [name] is seen after a hiatus of a couple of years.  He continues  to have problems with chest pain.  He had undergone catheterization a  couple of years ago demonstrating no significant obstructive disease and  negative Myoview scan.  He does have recumbent pain and was put on a  PPI, which he was not able to afford, and then started taking Zantac,  which he takes 3-4 a day.  This helps significantly.  He also has some  musculoskeletal components to this.   His medications include the Zantac.   EXAMINATION:  His blood pressure is 134/90, pulse of 79.  LUNGS:  Clear.  Heart sounds were regular.  EXTREMITIES:  Without any edema.   Electrocardiogram demonstrated sinus rhythm at 74 with intervals of  0.14/0.09/0.36.  The axis was mildly leftward at a -70.   There was some T-wave flattening.   IMPRESSION:  1. Noncardiac chest pain.  2. Symptoms consistent with gastroesophageal reflux disease.  3. Borderline hypertension.  4. Obesity.   PLAN:  Will plan to arrange for [name] to have primary care  established with our group as well as GI evaluation.   We will be glad to see the patient again as needed.     Duke Salvia, MD, [facility]  Electronically Signed    SCK/MedQ  DD: 07/25/2007  DT: 07/25/2007  Job #: [id number]

## 2011-01-13 NOTE — Assessment & Plan Note (Signed)
[facility]                                 ON-CALL NOTE   NAME:[name], [name]                   MRN:          [id number]  DATE:10/25/2008                            DOB:          [birth year]    The patient called in the evening of October 24, 2008, to inquire as to  whether or not he should take his Tikosyn dose that was regularly  scheduled for 7:00 p.m. at 9:45 p.m.  The patient reports normally being  100% compliant with prescribed Tikosyn to be taken at 7:00 a.m. and 7:00  p.m. daily.  I instructed the patient to take his Tikosyn at 9:45 p.m.  which was as soon as he could and to delay his morning dose of Tikosyn  until 8:00 a.m. on the morning of October 25, 2008, and then to return  to his normal schedule of 7:00 p.m. and 7:00 a.m. Tikosyn dosing.   At the end of the conversation, the patient had no questions or concerns  that had not been addressed.  The patient was also instructed should he  have any change in his symptomatology to feel free to call back at any  time or to present to the emergency department for evaluation.     Jarrett Ables, [facility]     MS/MedQ  DD: 10/25/2008  DT: 10/25/2008  Job #: [id number]

## 2011-01-13 NOTE — Assessment & Plan Note (Signed)
[facility]                         ELECTROPHYSIOLOGY OFFICE NOTE   NAME:[name]                   MRN:          [id number]  DATE:02/21/2008                            DOB:          1961-06-04    [name] is seen following stress testing that was set up at the  hospital, when he presented with chest pain.  He has had chest pains on  and off over the years.  There has been some supposition as related to  GE reflux disease.  However, he came in with recurrent chest pain and  was sent to the office for outpatient evaluation, which demonstrated  nonischemic Myoview and an echo, both of which confirmed as ejection  fractions of about 40% to 45%.   I should note that his chest pains have been better since Norvasc was  initiated a couple of weeks ago.   MEDICATIONS:  His other medications include Zantac 150 that he takes  periodically and Dr. Jarold Motto tried to get him on Aciphex, but he is  not able to afford that.   PAST MEDICAL HISTORY:  In addition to above is notable for an atrial  tachycardia and sinus node dysfunction with bradycardia.   PHYSICAL EXAMINATION:  VITAL SIGNS:  Today, his blood pressure is  131/84, his pulse is 63.  LUNGS:  Clear.  HEART:  Sounds were regular.  ABDOMEN:  Soft with active bowel sounds.  EXTREMITIES:  Without edema.   IMPRESSION:  1. Probably, noncardiac chest pain.  2. Newly identified cardiomyopathy.  3. Hypertension.  4. Gastroesophageal reflux disease.  5. Hypertension.   Mr. [name] has a variety of issues and they are interacting.  We will  plan first to discontinue his Norvasc and put him on Lotensin at 10 and  Coreg 3.125 b.i.d..  He can get these with the [facility] generic program.  We will have him come back and see Dorian Pod in about 3 weeks for  a drug up titration; he will also need a BMET at that time.   We have also put him back on Zantac at 300 mg a day, as he cannot  afford  the PPI.  He will follow up with Dr. Jarold Motto as previously scheduled.    Duke Salvia, MD, [facility]  Electronically Signed   SCK/MedQ  DD: 02/21/2008  DT: 02/22/2008  Job #: [id number]

## 2011-01-13 NOTE — Assessment & Plan Note (Signed)
[facility]                         ELECTROPHYSIOLOGY OFFICE NOTE   NAME:Samples, [name]                   MRN:          [id number]  DATE:10/23/2008                            DOB:          May 10, 1961    [name] is seen in followup for atrial fibrillation for which  Tikosyn was recently started in hospital.  He has history of sinus node  dysfunction and status post AAI pacemaker.   He has a history of nonischemic heart disease with a intercurrent non-  STEMI in the fall.  His ejection fracture is 60%.   He comes in with complaints about:  1. Pain from his back into his chest, which is terrible.  2. Pain in his legs bilaterally since the initiation of Tikosyn.  3. Numbness on the right side of the body.  4. Overall lassitude.   I should note that he has a recent admission as an inpatient to the  emergency room for chest pain, and underwent a CT scan, which was  negative and went home AMA.   His current medications include; Tikosyn, potassium, methimazole,  Diovan, and aspirin.   Interrogation of his pacemaker demonstrates that he had atrial  fibrillation in the last month as described about 20% of the time,  although this includes the week and a half before the Tikosyn was  initiated.   IMPRESSION:  1. Paroxysmal atrial fibrillation, on Tikosyn, holding sinus rhythm.  2. Nonischemic cardiomyopathy,  now with near-normal left ventricular      function.  3. Sinus node dysfunction status post AAI placement.  4. Intercurrent non-ST segment elevation myocardial infarction.  5. History of deep venous thrombosis/pulmonary embolism, previously on      Coumadin, currently not.  6. Leg pain, temporary related to initiation of Tikosyn.  7. Diffuse numbness - waxing and weaning.   [name] has variety of complaints, some of them are temporarily  related to Tikosyn and some not.  I did a literature search, and did not  really find much on  leg pain or calf pain with Tikosyn.  There is a  description of arthralgia.  There is description of also paresthesias  and numbness, so we will have to keep this in mind.  However, his  complaints are varied, and it is hard to be deplored out, what to do and  what not, what is dramatic and what is not.    Duke Salvia, MD, [facility]  Electronically Signed   SCK/MedQ  DD: 10/23/2008  DT: 10/24/2008  Job #: [id number]

## 2011-01-13 NOTE — Op Note (Signed)
NAME:  [name], [name]            ACCOUNT NO.:  [id number]   MEDICAL RECORD NO.:  [id number]          PATIENT TYPE:  OIB   LOCATION:  3526                         FACILITY:  [facility]   PHYSICIAN:  Kathaleen Maser. Pool, M.D.    DATE OF BIRTH:  03-31-1961   DATE OF PROCEDURE:  03/22/2009  DATE OF DISCHARGE:  03/22/2009                               OPERATIVE REPORT   PREOPERATIVE DIAGNOSIS:  L4-5 stenosis.   POSTOPERATIVE DIAGNOSIS:  L4-5 stenosis.   PROCEDURE NAME:  Bilateral L4-5 decompressive laminotomies with  bilateral L4 and L5 decompressive foraminotomies.   SURGEON:  Kathaleen Maser. Pool, MD   ASSISTANT:  Reinaldo Meeker, MD   ANESTHESIA:  General endotracheal.   INDICATION:  [name] is a 50 year old male with symptomatic lumbar  stenosis, failing conservative management.  He presents now for L4-5  decompression in hopes improving his symptoms.   OPERATIVE NOTE:  The patient was brought to the operating room and  placed on operating table in supine position.  After adequate level of  anesthesia was achieved, the patient placed prone onto Wilson frame and  appropriately padded.  The patient's lumbar region was prepped and  sterilely.  A 10 blade was used to make a linear skin incision overlying  L4-5 interspace.  This was carried down sharply in the midline.  Subperiosteal dissection was performed exposing lamina and facet joints  of L4 and L5.  Deep self-retaining retractor was placed.  Intraoperative  x-ray was taken and level was confirmed.  Decompressive laminotomies  were then performed bilaterally using the high-speed drill and Kerrison  rongeurs to remove the inferior aspect of lamina of L4, medial aspect of  L4-5 facet joint, and the superior rim of L5 lamina.  Ligament flavum  was elevated and resected in piecemeal fashion using Kerrison rongeurs.  Underlying thecal sac and exiting L4 and L5 nerve roots were identified.  Decompressive foraminotomies were then performed  along the course of  exiting nerve roots bilaterally.  At this point, a very thorough  decompression had been achieved.  Wound was then irrigated with  antibiotic solution.  Gelfoam was placed topically for hemostasis and  found to be good.  Wound was then closed in layers with Vicryl sutures.  Steri-Strips and sterile dressings were applied.  There were no apparent  complications.  The patient tolerated the procedure well and he returns  to recovery room postoperatively.           ______________________________  Kathaleen Maser Pool, M.D.     HAP/MEDQ  D:  03/22/2009  T:  03/23/2009  Job:  [id number]

## 2011-01-13 NOTE — Consult Note (Signed)
NAME:  [name], IMBERT NO.:  [id number]   MEDICAL RECORD NO.:  [id number]          PATIENT TYPE:  EMS   LOCATION:  MAJO                         FACILITY:  [facility]   PHYSICIAN:  Verne Carrow, MDDATE OF BIRTH:  Sep 05, 1960   DATE OF CONSULTATION:  10/18/2008  DATE OF DISCHARGE:                                 CONSULTATION   PRIMARY CARDIOLOGIST:  Duke Salvia, MD, [facility]   PRIMARY MEDICAL DOCTOR:  Corwin Levins, MD   CHIEF COMPLAINT:  Chest pain.   HISTORY OF PRESENT ILLNESS:  A 50 year old African American male with  a history of paroxysmal atrial fibrillation, nonischemic cardiomyopathy  (ejection fraction equal to 40-45%), cocaine-induced MI, history of  polysubstance abuse (clean for 6 years), history of pulmonary emboli,  and hyperthyroidism, presenting with atypical chest pain that is  pleuritic starting yesterday located in the substernal area and  radiating into the back and intensifying with deep inspiration.  The  patient denies any associated symptoms beside some mild lightheadedness.  The patient says since his discharge at his last admission at the end of  January 2010, he has had some nondescript musculoskeletal pain more  associated in the below the knee area, right worse than left, but also  on the entire right side occasionally.  They can come on with exertion  and often does, however, it can also come on at rest.  The patient notes  temporal association with his initiation with Tikosyn at his last  admission.  The patient also reports no significant change to chronic  shortness of breath, dyspnea on exertion, and fatigue.  Of note, the  patient had wisdom teeth removed 2 days ago and has taken 1-2 Vicodin  each day for the pain.  However, currently the patient is asymptomatic.   PAST MEDICAL HISTORY:  1. Nonischemic cardiomyopathy with ejection fraction of 40-45% (as of      echo June 2009), cardiac catheterization in October 2009,  showing      scattered diffuse coronary irregularities without critical      stenosis.  2. Paroxysmal atrial fibrillation.  3. EtOH abuse  (clean x6 years).  4. Cocaine abuse  (clean x6 years).  5. Tobacco abuse  (clean x6 years).  6. Myocardial infarction (most likely secondary to cocaine use).  7. History of mild pancreatitis.  8. History of pulmonary emboli.  9. Status post pacemaker ([employer]tronic ADAPTA) June 22, 2008, for post      termination pause/junctional bradycardia.  10.Hyperthyroidism.  11.Peptic ulcer disease.  12.Dyslipidemia.   SOCIAL HISTORY:  The patient lives in [place] alone.  He is disabled.  He has 7 children.  He has a 10-pack-year smoking history, but quit in  2004.  No current EtOH use, no current drug use.  No herbal medication  use.  He has a low-fat, low-sodium, low-sugar diet.  Unable to regularly  exercise.   FAMILY HISTORY:  Noncontributory.   REVIEW OF SYSTEMS:  Please see HPI.  All other systems reviewed and were  negative.   ALLERGIES:  NKDA.   MEDICATIONS:  1. Coreg 25 mg p.o. b.i.d.  2.  Aspirin 325 mg p.o. daily.  3. Diovan 160 mg p.o. daily.  4. Methimazole 10 mg p.o. daily.  5. KCl 20 mEq p.o. daily.  6. Casodex 50 mg p.o. daily.  7. Simvastatin 80 mg p.o. daily.  8. Tikosyn 500 mcg p.o. q.12 h., started on September 26, 2008.   PHYSICAL EXAMINATION:  VITAL SIGNS:  Temperature 98.2 degrees  Fahrenheit, BP 103/68, pulse 65, respiration rate 18, O2 saturation 95%  on room air.  GENERAL:  On exam, the patient was alert and oriented x3, in no apparent  distress.  He can move and speak easily without respiratory distress.  HEENT:  His head was normocephalic and atraumatic.  Pupils were equal,  round, and reactive to light.  Extraocular muscles were intact.  Nares  were patent without discharge.  Dentition, the patient is missing  several of his teeth including most of his bottom front teeth.  Oropharynx is without erythema or  exudates.  NECK:  Supple without lymphadenopathy.  No JVD.  No thyromegaly.  No  bruits.  HEART:  Irregularly irregular with an audible S1 and S2.  No clicks,  rubs, or murmurs; occasional gallops.  Pulses were 2+ and equal in both  upper and lower extremities bilaterally.  LUNGS:  Clear to auscultation bilaterally.  SKIN:  No rashes, lesions, or petechiae.  ABDOMEN:  Soft, nontender, nondistended.  The patient is overweight.  He  had normal abdominal bowel sounds.  No rebound or guarding.  No  hepatosplenomegaly.  EXTREMITIES:  No clubbing, cyanosis, or edema.  MUSCULOSKELETAL:  He is missing all of the digits on his right hand,  however, no other joint deformities or effusions.  No spinal or CVA  tenderness.  NEUROLOGIC:  Cranial nerves II through XII were grossly intact.  Strength 5/5 in all extremities and axial groups.  He had normal  sensation throughout and normal cerebellar function.   RADIOLOGY:  CT angio of the chest is pending.  EKG showed a sinus rhythm  with a rate of 71, left axis, which was slightly changed from last EKG  performed September 28, 2008, otherwise no acute ST-T wave changes.  No  significant Q waves, no evidence of hypertrophy, PR 160, QRS 96, and QTc  439.   LABORATORY DATA:  Laboratory values are pending.   ASSESSMENT AND PLAN:  This is a 50 year old African American male with a  history of paroxysmal atrial fibrillation, tachy-brady, and post  termination pauses, status post pacemaker placement in October 2009.  Loaded with Tikosyn on September 26, 2008, nonischemic cardiomyopathy,  former polysubstance abuse disorder, hypertension, hyperlipidemia,  presented to the ED with complaints of episodes of substernal chest  pain, worse with inspiration, last night radiation to back, and no  associated symptoms with shortness of breath, diaphoresis.  Currently,  without chest pain or shortness of breath.  Also notes bilateral leg  aches at night since starting  Tikosyn.  ECG shows sinus rhythm with  normal QTc without ischemic changes.  The patient is refusing admission  at this time.  CT angio is pending to rule out pulmonary embolism.  We  will check complete metabolic panel, CBC, CK,  and point-of-care markers for acute coronary syndrome.  If the labs are  okay and CT angio is negative, the patient will be discharged and will  follow up with an already scheduled appointment with Dr. Graciela Husbands, his  primary cardiologist next week.      Jarrett Ables, [facility]  Verne Carrow, MD  Electronically Signed    MS/MEDQ  D:  10/18/2008  T:  10/19/2008  Job:  [id number]

## 2011-01-13 NOTE — H&P (Signed)
NAMECLIDE, REMMERS NO.:  [id number]   MEDICAL RECORD NO.:  [id number]          PATIENT TYPE:  INP   LOCATION:  2921                         FACILITY:  [facility]   PHYSICIAN:  Arturo Morton. Riley Kill, MD, FACCDATE OF BIRTH:  22-Feb-1961   DATE OF ADMISSION:  06/20/2008  DATE OF DISCHARGE:                              HISTORY & PHYSICAL   PRIMARY CARDIOLOGIST:  Duke Salvia, MD, [facility]   CONGESTIVE HEART FAILURE Juniper Snyders:  Dorian Pod, ACNP   PRIMARY CARE PHYSICIAN:  Corwin Levins, MD   HISTORY OF PRESENT ILLNESS:  This is a 50 year old African American male  with known history of nonischemic cardiomyopathy with an EF of 40-45%  per echocardiogram in June 2009, who was admitted secondary to chest  pain.  The patient actually began to experience chest pain yesterday  evening after walking back from visiting his mother's house.  The pain  started with shortness of breath and then began to have crushing chest  pain and tightness radiating to the left arm.  The patient did not seek  medical attention at that time.  He took a baby aspirin and it lasted  about 3 hours, and he took an addition of baby aspirin then after 3  hours the pain subsided, but he still had some left shoulder aching for  which he put a warming patch on it and was able to sleep throughout the  night.  The following morning this a.m., the patient had no further  complaints of chest pain, but he noticed some chest congestion and extra  coughing and had just not felt right since.  As a result of that, the  patient called Dr. Odessa Fleming office to express his symptoms and was  advised to come to the emergency room.  When asked by me to the patient  why he did not seek medical attention, he said he was too afraid that  they would put him in the hospital.  Currently, the patient is in the ER  with no further complaints of chest pain, just some nagging pressure and  some mild chest congestion and cough only.   As a result of this, the  patient was seen and examined originally by the emergency room physician  and started on heparin.  Point-of-care markers were completed and found  to be positive with a troponin of 0.54, CK 82.8, and MB 13.7.  The  patient was also given sublingual nitroglycerin and has now been free  pain.   REVIEW OF SYSTEMS:  Positive for sweating, chest pain, shortness of  breath, dyspnea on exertion with pain radiating to the left arm and  shoulder, and GERD symptoms.  Other systems are reviewed and found to be  negative.   PAST MEDICAL HISTORY:  1. Nonischemic cardiomyopathy with an EF of 40-45% per echo in June      2009.  2. Alcohol-induced gastritis.  3. Hiatal hernia.  4. Paroxysmal atrial fibrillation.  5. Cocaine abuse.  6. MI in the past associated with cocaine.  7. History of mild pancreatitis.  8. History of tobacco abuse.  9. The patient  also had a history of bilateral pulmonary emboli in      2004, untreated dyslipidemia, peptic ulcer disease, and      hypertension.   PAST CARDIAC WORKUP:  The patient had a cardiac catheterization  completed in 2005 revealing nonobstructive CAD 30% narrowing in the mid  circ with no significant obstruction in the LAD, 50% acute marginal of  the right coronary artery with mild global hypokinesis.   SOCIAL HISTORY:  He lives in [place] with his wife.  He works as an  Air traffic controller.  He is married.  He is a 30-pack-year smoker, but  stopped in 2005.  He drinks alcohol on the weekends about sips of liquor  throughout the weekend.  He has not used cocaine for over 5 years.   FAMILY HISTORY:  Diabetes and heart problems in his mother.  Father  deceased with known history of hypertension, unknown cause of death.  He  has 4 siblings, 2 brothers and 2 sisters who as far as he knows are in  good health.   CURRENT MEDICATIONS AT HOME:  1. Coreg 6.25 mg b.i.d.  2. Ranitidine 300 mg b.i.d.  3. Benazepril 10 mg daily.  4.  Baby aspirin daily.   ALLERGIES:  No known drug allergies.   LABORATORY DATA:  Sodium 138, potassium 4.5, chloride 108, CO2 24, BUN  17, creatinine 1.2, and glucose 99.  Hemoglobin 15.7, hematocrit 48.9,  white blood cells 6.3, and platelets 255.  Subsequent troponin non-point-  of-care 1.14, MB 17.5, and CK 36.2.  Magnesium 2.2, PTT 28, PT 13.1, and  INR 1.0.   PHYSICAL EXAMINATION:  VITAL SIGNS:  Blood pressure 143/70, pulse 101,  respirations 18, temperature 98.0, and O2 sat 97% on room air.  HEENT:  Head is normocephalic and atraumatic.  Eyes, PERRLA.  Mucous  membranes of mouth are pink and moist.  Tongue is midline.  NECK:  Supple without JVD.  No carotid bruits appreciated.  CARDIOVASCULAR:  Regular rhythm without murmurs, rubs, or gallops.  Pulses are 2+ and equal bilaterally.  LUNGS:  Clear to auscultation essentially with some scattered crackles.  ABDOMEN:  Soft, nontender, and 2+ bowel sounds.  EXTREMITIES:  Without clubbing, cyanosis, or edema.  No evidence of DVT.  NEUROLOGIC:  Cranial nerves II through XII are grossly intact.   Chest x-ray revealing no active disease.  EKG revealing atrial fib with  a ventricular rate of 92 beats per minute.   IMPRESSION:  1. Non-ST elevated myocardial infarction with known history of      coronary artery disease with last catheterization in 2005 revealing      nonobstructive coronary artery disease.      a.     Positive coronary cardiac enzymes.  2. History of nonischemic cardiomyopathy with ejection fraction of 40-      45% per most recent echocardiogram.  3. Atrial fibrillation and flutter.  4. Dyslipidemia.  5. History of bilateral pulmonary emboli.   PLAN:  This is a 50 year old African American male with above mentioned  history, who was walking home from his mother's house who had sudden  onset of chest pain, shortness of breath, diaphoresis, lasting  approximately 3 hours radiating down to the left arm and shoulder who   did not seek immediate medical care at that time and called our office  this morning to report these symptoms and advised to come to the  emergency room.  He was seen and examined initially by the ER physician  and was placed on heparin.  Point-of-care markers were positive.  The  patient was seen and examined thereafter by myself and Dr. Shawnie Pons.  The patient will be admitted.  Atrial fibrillation is  prominent at this time with a heart rate in the 100s.  Our plan will be  to check a D-dimer with a history of PE to rule this out.  The patient's  Coreg will be increased to 12.5 mg b.i.d.  We will continue ACE  inhibitor and add Protonix.  The patient will  have cardiac  catheterization in a.m., if D-dimer is negative.  If positive, the  patient will have a spiral CT to rule out PE prior to the procedure.  We  have explained this to the patient per Dr. Riley Kill and myself.  He  verbalizes understanding, and he is willing to be admitted and proceed  with further testing.      Bettey Mare. Lyman Bishop, NP      Arturo Morton. Riley Kill, MD, [facility]  Electronically Signed    KML/MEDQ  D:  06/20/2008  T:  06/21/2008  Job:  [id number]   cc:   Corwin Levins, MD

## 2011-01-13 NOTE — Assessment & Plan Note (Signed)
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  He is referred today by Dr. Berton Mount for evaluation of  atypical chest pain with a recent negative cardiac work up in November  of 2008.  The patient has had previous negative coronary angiography and  a negative Myoview scan.   [name] described further classic burning epigastric and substernal  chest pain with regurgitation, mostly during the waking hours and not  nocturnally.  He has had no dysphagia.  He does use ethanol rather  heavily but denies alcohol dependency.  He does not smoke but does take  frequent NSAID's because of low back pain.  His epigastric pain is of a  burning quality and does not radiate.  It is made worse by eating.  He  has fairly regular bowel habits but does have some intermittent rectal  bleeding.  He relates that he had an endoscopic exam several years ago  although I cannot find these reports.  I reviewed his discharge summary  from [facility] in August of 2004, at which time he was  admitted by Dr. Veneda Melter. Dx. was benign noncardiac chest pain and  acute prostatitis.   I previously did see [name] in 1992 at which time he had unexplained  abdominal pain.  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  Ultrasound of the upper abdomen at that time was unremarkable as  was routine laboratory screening.  The patient follows a regular diet  and has gained approximately 40 pounds in weight over the last several  years.  He does have a past history of cocaine abuse, but denies use  since 1999.  He denies any specific food intolerances or  lactose  intolerance.   PAST MEDICAL HISTORY:  His past medical history is otherwise  noncontributory except for deep venous thrombosis requiring  anticoagulation in August of 2004.  At that time he also had  hyperlipidemia and a second degree heart block.   MEDICATIONS:  1. Zantac 150 mg four to five times a day.  2. P.r.n. Motrin.   ALLERGIES:  He denies drug allergies.   FAMILY HISTORY:  Noncontributory.   SOCIAL HISTORY:  He is married and lives with his wife. He is self  employed.  He does not smoke but uses ethanol rather heavily.   REVIEW OF SYSTEMS:  Otherwise noncontributory without active  cardiovascular or pulmonary complaints.  He does have some  deconditioning and shortness of breath with exertion.  He is not having  any lower extremity pain, edema, and he denies any neurological or  psychiatric difficulties.  His review of systems is otherwise  noncontributory.   PHYSICAL EXAMINATION:  GENERAL APPEARANCE:  He is a healthy-appearing  black male appearing his  stated age.  VITAL SIGNS:  He is 5 feet, 11 inches and weighs 256 pounds.  Blood  pressure is 142/86.  Pulse was 72 and regular.  HEENT:  I could not appreciate stigmata of chronic liver disease.  NECK:  No thyromegaly.  CHEST:  Clear.  HEART:  He was in a regular rhythm without murmurs, rubs or gallops.  ABDOMEN:  There was no hepatosplenomegaly, abdominal masses or  tenderness noted and the bowel sounds were normal.  EXTREMITIES: Peripheral extremities were unremarkable.  RECTAL:  Inspection of the rectum was unremarkable without fissures,  fistulae or external hemorrhoids.  Rectal exam was difficult but I got  some scant stool which was trace Guaiac positive.  PSYCHIATRIC:  Mental status was clear.   ASSESSMENT:  1. Chronic gastroesophageal reflux disease versus recurrent peptic      ulcer disease related to Helicobacter Pylori positivity and      nonsteroidal anti-inflammatory drug use.  2. History  of ethanol abuse and vague history of possible relapsing      pancreatitis.  3. Previous history of cocaine abuse - rule out chronic liver disease.  4. Intermittent rectal bleeding, rule out colonic polyposis.  5. History of hyperlipidemia.  6. 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   RECOMMENDATIONS:  1. Start Aciphex 20 mg thirty minutes before breakfast and supper with      standard antireflux maneuvers.  2. Check CBC and screening labs.  3. Outpatient endoscopy and colonoscopy at his convenience.  4. Continue primary care followup with Dr. Jonny Ruiz as scheduled.     Vania Rea. Jarold Motto, MD, [facility], FAGA  Electronically Signed    DRP/MedQ  DD: 08/30/2007  DT: 08/30/2007  Job #: [id number]   cc:   Duke Salvia, MD, [facility]  Corwin Levins, MD

## 2011-01-13 NOTE — Assessment & Plan Note (Signed)
[facility]                          CHRONIC HEART FAILURE NOTE   NAME:[name], [name]                   MRN:          [id number]  DATE:03/21/2008                            DOB:          Dec 24, 1960    PRIMARY CARDIOLOGIST:  Duke Salvia, MD, [facility]   GASTROENTEROLOGY:  Vania Rea. Jarold Motto, MD, [facility], FAGA   [name] is a 50 year old African American male referred here to the  Heart Failure Clinic for further management of his nonischemic  cardiomyopathy with an EF currently 40-45% by echocardiogram in June  2009.  [name] has a quite interesting history in that he has had  previous myocardial infarctions and refused care.  He did undergo a  cardiac catheterization back in 2005, for recurrent chest pain.  He had  an EF of 40-45% at that time with nonobstructive disease.  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  He lives here  in [place] with his wife, [name].  He has a history of chronic atrial  fibrillation and is not on Coumadin at this time.  He states he has been  on it in the past.  It appears he has also had a history of medical  noncompliance.  Today, he denies any symptoms suggestive of volume  overload.  Denies any chest discomfort.  He states as long as he takes  his Zantac, he has no discomfort in his chest.  He denies shortness of  breath with rest or exertion.  He does a lot of bending at his job as a  Curator.  Denies any episodes of lightheadedness, dizziness, presyncope  or syncopal episodes.  He states he essentially can do what he needs to  do without any problems.   PAST MEDICAL HISTORY:  1. Nonischemic cardiomyopathy with EF of 45% by echocardiogram in June      2009.  2. History of bilateral pulmonary emboli, previous Coumadin therapy.  3. Chronic atrial fibrillation.  4. History of noncompliance.  5.  History of GERD.  6. History of surgery to the right hand for finger amputation.  7. History of cocaine use, the patient states he has not used in 10      years.  8. Nonobstructive coronary artery disease by cardiac catheterization.  9. History of EtOH abuse.  The patient states he still has an      occasional mixed drink.  10.History of sinus node dysfunction with bradycardia.  11.Status post recent stress Myoview nonischemic with an EF of 40-45%.  12.Status post endoscopy showing esophagitis, reflux, GERD, hiatal      hernia done on November 02, 2007, in the setting of history of alcohol      induced gastritis/mild pancreatitis.  13.Untreated dyslipidemia.   REVIEW OF SYSTEMS:  As stated above, otherwise negative.   CURRENT MEDICATIONS:  1. Aspirin 325 mg daily.  2. Lotensin 10 mg daily.  3. Coreg, the patient should be taking 3.125 mg b.i.d.  He has been      taking Coreg 6.25 mg  once a day.  4. Zantac 300 mg daily.   PHYSICAL EXAMINATION:  VITAL SIGNS:  Weight 249 pounds, this is  consistent per patient.  Blood pressure 139/83 with a heart rate of 61.  GENERAL:  [name] is in no acute distress.  NECK:  No signs of jugular vein distention at 45 degree angle.  LUNGS:  Clear to auscultation bilaterally.  CARDIOVASCULAR:  S1 and S2.  Irregular rhythm.  ABDOMEN:  Soft and nontender.  Positive bowel sounds.  LOWER EXTREMITIES:  Without clubbing, cyanosis or edema.  NEUROLOGIC:  Alert and oriented x3, somewhat flat affect but receptive  to education.   I have initiated his heart failure education with him.  We have reviewed  sodium restricted diet, discontinuation of alcohol, avoidance of  recreational substances, the risk factors associated with LV  dysfunction, and the importance of his medication.  I am going to  attempt to increase his Coreg to 6.25 mg b.i.d. and see him back in 3-4  weeks.  He will need blood work drawn today.  I have also encouraged him  to establish care  with the primary care physician.      Dorian Pod, ACNP  Electronically Signed      Bevelyn Buckles. Bensimhon, MD  Electronically Signed   MB/MedQ  DD: 03/22/2008  DT: 03/22/2008  Job #: [id number]

## 2011-01-13 NOTE — Op Note (Signed)
NAMEHAMILTON, [name] NO.:  [id number]   MEDICAL RECORD NO.:  [id number]          PATIENT TYPE:  INP   LOCATION:  6527                         FACILITY:  [facility]   PHYSICIAN:  Duke Salvia, MD, FACCDATE OF BIRTH:  1961/08/24   DATE OF PROCEDURE:  06/22/2008  DATE OF DISCHARGE:                               OPERATIVE REPORT   PREOPERATIVE DIAGNOSIS:  Atrial fibrillation with rapid ventricular  response and post termination pauses and junctional bradycardia.   POSTOPERATIVE DIAGNOSIS:  Atrial fibrillation with rapid ventricular  response and post termination pauses and junctional bradycardia.   PROCEDURE:  Single chamber pacemaker implantation (AAI).   Following obtaining informed consent, the patient was brought to the  Electrophysiology Laboratory and placed on the fluoroscopic table in  supine position.  After routine prep and drape of the left upper chest,  lidocaine was infiltrated in the prepectoral subclavicular region.  Incision was made and carried down to layer of the prepectoral fascia  using electrocautery and sharp dissection.  A pocket was performed  similarly.  Hemostasis was obtained.   Thereafter, attention was turned to gain the access to the extrathoracic  left subclavian vein, which was accomplished without difficulty without  the aspiration of air or puncture of the artery.  A guidewire was placed  and retained.  A 7-French sheath was placed and through this was passed  a Medtronic 5076 58-cm active fixation atrial lead serial number  [id number].  Under fluoroscopic guidance, it was manipulated the right  atrial appendage where bipolar P-wave was 6 mV with pace impedance of  788 ohms, a threshold of 1 volt at 0.5 milliseconds.  Current threshold  is 2.0 mA.  There is no diaphragmatic pacing at 10 volts and the current  of injury was prominent.  The lead was secured with a prepectoral  fashion and attached to a Medtronic Adapta L pulse  generator serial  #[id number].  The ventricular port was plugged through the underlying  capitalize ventricular port was plugged.  The pocket was copiously  irrigated with antibiotic containing saline solution.  Hemostasis was  assured.  The lead and the pulse generator were placed in the pocket  secured to the prepectoral fascia.  The wound was closed in three layers  in a normal fashion.  It was washed, dried, and a benzoin Steri-Strip  dressing was applied.  Needle counts, sponge counts, and instrument  counts were correct at the end of the procedure according to the staff.  The patient tolerated the procedure without apparent complication.      Duke Salvia, MD, [facility]  Electronically Signed     SCK/MEDQ  D:  06/22/2008  T:  06/23/2008  Job:  [id number]

## 2011-01-13 NOTE — Assessment & Plan Note (Signed)
[name]                                 ON-CALL NOTE   NAME:Arnott, [name]                   MRN:          [id number]  DATE:06/23/2008                            DOB:          [birth year]    HISTORY:  [name] is a 50 year old male patient who was just  discharged from [facility] today after presenting with a non-ST-  elevation myocardial infarction without a culprit lesion identified and  treated medically as well as paroxysmal atrial fibrillation and  tachybrady syndrome.  He underwent pacemaker implantation.  The patient  called the emergency service tonight with concerns of constipation.  He  has not had a bowel movement in several days.   RECOMMENDATIONS:  I suggested Colace or generic brand of stool softener  to take over-the-counter once or twice a day at first.  If he is not  getting sufficient relief with this, he can try Dulcolax or glycerin  suppository.   DISPOSITION:  The patient should contact us with further questions if  necessary.      Tereso Newcomer, PA-C  Electronically Signed      Jesse Sans. Daleen Squibb, MD, [facility]  Electronically Signed   SW/MedQ  DD: 06/23/2008  DT: 06/23/2008  Job #: [id number]

## 2011-01-13 NOTE — Discharge Summary (Signed)
NAME:  [name], [name] NO.:  [id number]   MEDICAL RECORD NO.:  [id number]          PATIENT TYPE:  INP   LOCATION:  2009                         FACILITY:  [facility]   PHYSICIAN:  Duke Salvia, MD, FACCDATE OF BIRTH:  1960-11-17   DATE OF ADMISSION:  09/26/2008  DATE OF DISCHARGE:  09/29/2008                               DISCHARGE SUMMARY   CARDIOLOGIST:  Duke Salvia, MD, [facility]   PRIMARY CAREGIVER:  Corwin Levins, MD   This patient has no known drug allergies.   TIME FOR THIS DICTATION:  Greater than 35 minutes.   FINAL DIAGNOSES:  1. Paroxysmal atrial fibrillation.      a.     Interrogation of the patient's pacemaker indicates the 50%       of the time, he is in atrial fibrillation.  2. The patient is chronically tired.  3. Tikosyn suppression therapy started this hospitalization.  The      patient tolerated 500 mcg p.o. q.12 h. with no prolongation of the      QTC interval.   SECONDARY DIAGNOSES:  1. Pacemaker (Medtronic ADAPTA) implanted on June 22, 2008, for      post termination pauses/junctional bradycardia.  2. Hyperthyroidism.  3. Nonischemic cardiomyopathy.  4. Left heart catheterization, June 21, 2008, scattered diffuse      irregularities, no critical stenosis.  5. Echocardiogram, July 18, 2008, ejection fraction 50%, trivial      mitral regurgitation, trivial tricuspid regurgitation.  6. History of cocaine abuse.  7. Myocardial infractions in the past in the setting of cocaine      toxicity.  8. Bilateral pulmonary emboli 2004 and 2009.  9. Dyslipidemia.  10.Peptic ulcer disease.  11.Hypertension.   PROCEDURES:  None this admission.  The patient was admitted for Tikosyn  and watched on telemetry with serial electrocardiograms for any  prolongation of the QT interval.  During progressive dosing, this did  not happen.   BRIEF HISTORY:  [name] is a 50 year old male.  He has a history of  paroxysmal atrial fibrillation.   He is always tired.  He presented to  the office at [facility].  His pacemaker which was implanted in  October 2009, for post-termination pauses was interrogated, which showed  that the patient has had burden of atrial fibrillation 50% of the time.  He was admitted for Tikosyn therapy on September 26, 2008.   HOSPITAL COURSE:  The patient presents electively for Tikosyn therapy.  He received his first dose at 7 p.m. but prior to that by one-half hour,  the patient who was in atrial fibrillation, rate controlled, converted  to sinus rhythm, and has maintained sinus rhythm for the continuing 60  hours prior to his discharge.  Once again, QT interval was not  prolonged.  Of note, the patient does need to maintain his potassium  greater than 4 and he required 60 mEq of adjustment on hospital day #2  to maintain a potassium of 4 up from 3.7, the day before.  He will go  home on supplemental potassium.   His medications at discharge  are:  1. Enteric-coated aspirin 325 mg daily.  2. Coreg 25 mg twice daily.  3. Kapidex 50 mg daily.  4. Simvastatin 80 mg daily at bedtime.  5. Diovan 160 mg daily.  6. Methimazole 10 mg daily.  7. Potassium chloride 20 mEq daily.   NEW MEDICATION:  Tikosyn 500 mcg 1 tablet twice daily or 12 hours apart.   He follows up with Dr. Graciela Husbands at [facility] at [address] Tuesday, October 23, 2008, at 4 o'clock.   LABORATORY STUDIES THIS ADMISSION:  On discharge, his serum  electrolytes; sodium was 134, potassium 4, chloride 102, carbonate 24,  BUN is 14, creatinine 1.14, glucose is 132.  Of note, he will be given  20 mEq on September 28, 2008, and then we will have BMET on September 29, 2008.  If he does not achieve a potassium of 4, we will have to increase  his home dose of 20 mEq to more like 30 mEq.      Maple Mirza, [place]      Duke Salvia, MD, [facility]  Electronically Signed    GM/MEDQ  D:  09/28/2008  T:  09/29/2008   Job:  [id number]   cc:   Corwin Levins, MD

## 2011-01-13 NOTE — Assessment & Plan Note (Signed)
[name]                          CHRONIC HEART FAILURE NOTE   NAME:[name], [name]                   MRN:          [id number]  DATE:08/09/2008                            DOB:          Feb 19, 1961    PRIMARY CARDIOLOGIST:  Duke Salvia, MD, [facility].   GASTROENTEROLOGY:  Vania Rea. Jarold Motto, MD, [facility], FACP, FAGA.   PRIMARY CARE PHYSICIAN:  Corwin Levins, MD.   HISTORY OF PRESENT ILLNESS:  [name] returns today for further  followup of his congestive heart failure, which is secondary to  nonischemic cardiomyopathy, status post hospitalization in October 2009  regarding a non-ST-elevated MI.  Cardiac catheterization showed no  definite obstruction to flow.  The patient was also noted to be in and  out of atrial fibrillation.  The patient was evaluated by EP and  underwent implantation of a single-chamber pacemaker with a Medtronic  adapted device.  I saw [name] in followup on July 13, 2008, at  which time, he appeared to be stable.  He continued to complain of  atypical chest discomfort.   LABORATORY WORK:  A 12-lead EKG at that time showed no acute findings.  I repeated his 2-D echocardiogram on July 18, 2008, ejection  fraction of 50%, mild diffuse left ventricular hypokinesis.   [name] continues to complain of atypical chest discomfort and also  complaining of burning around his pacemaker site.  Otherwise, no change  in status.  He states that he cannot afford the Toprol-XL 25 mg b.i.d.  The co-pay is too high for him.  He could only take the other medication  that he has been prescribed.  His most recent lab work checked on  July 13, 2008, showed a potassium of 4.2, BUN 12, creatinine 1.01  with a BNP of less than 30.  The patient also noted to have a TSH during  recent hospitalization of 0.221.  The patient has a pending follow up  with Dr. Jonny Ruiz for further evaluation.   PAST MEDICAL HISTORY:  1. Congestive  heart failure secondary to nonischemic cardiomyopathy      with EF now 55%.  2. Status post non-ST-elevated MI during recent hospitalization.      Cardiac catheterization showing no definite obstruction to flow      questionable layering plaque.  3. Atrial fibrillation, status post electrophysiology evaluation with      implantation of a Medtronic Adapta device secondary to brady-      tachycardia and atrial fibrillation with rapid ventricular      response.  4. History of alcohol-induced gastritis.  5. Hiatal hernia.  6. History of cocaine use.  7. Previous MIs in the past associated with cocaine use.  8. History of mild pancreatitis.  9. History of tobacco use.  10.History of bilateral pulmonary emboli in 2004.  11.Dyslipidemia.  12.Peptic ulcer disease.  13.Hypertension.   REVIEW OF SYSTEMS:  As stated above, otherwise negative.   CURRENT MEDICATIONS:  1. Lotensin 10 mg.  2. Coreg 12.5 mg b.i.d.  3. Toprol-XL 25 mg b.i.d.  4. Aspirin 325 daily.   PHYSICAL EXAMINATION:  VITAL  SIGNS:  Weight 250 pounds, blood pressure  115/68, and heart rate 59.  GENERAL:  [name] is in no acute distress.  NECK:  No signs of jugular vein distention at 45-degree angle.  LUNGS:  Clear to auscultation bilaterally.  CHEST:  He has a well-healed pacemaker site to the left upper chest  without redness, erythema, drainage, or hematoma.  CARDIOVASCULAR:  An  S1 and S2, regular rate and rhythm.  ABDOMEN:  Soft, nontender, positive bowel sounds.  LOWER EXTREMITIES:  Without clubbing, cyanosis, or edema.  NEUROLOGICAL:  Alert and oriented x3 with flat effect.   IMPRESSION:  Congestive heart failure secondary to nonischemic  cardiomyopathy with the ejection fraction 55%.  The patient is without  signs of volume overload at this time.  However, he continues to  complain of chest discomfort.  A 12-lead EKG showing sinus brady with T-  wave inversions in inferior lateral leads, unchanged from  previous EKGs.  Also, device interrogated at this time showing occasional paced rhythm  (atrial) brief in duration.  [name] is not on Coumadin.  He has a  CHADS score of 1 in the setting of history of ethyl alcohol abuse and  medical noncompliance.  It is felt he would not benefit from  anticoagulation therapy at this time.  This has also been discussed with  Dr. Antoine Poche in the past who is familiar with the patient.  Regarding  the low TSH, we will go ahead and check a free T3 and T4 today and refer  the patient to Dr. Jonny Ruiz for further management.  The patient is due for  an EP appointment next  month.  I have encouraged him to keep this.  The patient is also asking  for assistance in filling out his disability papers.  This will be sent  to help support.      Dorian Pod, ACNP  Electronically Signed      Rollene Rotunda, MD, [facility]  Electronically Signed   MB/MedQ  DD: 08/09/2008  DT: 08/10/2008  Job #: [id number]   cc:   Corwin Levins, MD

## 2011-01-15 ENCOUNTER — Ambulatory Visit (HOSPITAL_COMMUNITY)
Admission: RE | Admit: 2011-01-15 | Discharge: 2011-01-15 | Disposition: A | Discharge status: Home / Self Care | Payer: Medicaid Other | Source: Ambulatory Visit | Attending: Internal Medicine | Admitting: Internal Medicine

## 2011-01-15 DIAGNOSIS — I4891 Unspecified atrial fibrillation: Secondary | ICD-10-CM | POA: Insufficient documentation

## 2011-01-16 ENCOUNTER — Ambulatory Visit (HOSPITAL_COMMUNITY)
Admission: RE | Admit: 2011-01-16 | Discharge: 2011-01-17 | DRG: 251 | Disposition: A | Discharge status: Home / Self Care | Payer: Medicaid Other | Source: Ambulatory Visit | Attending: Internal Medicine | Admitting: Internal Medicine

## 2011-01-16 DIAGNOSIS — I428 Other cardiomyopathies: Secondary | ICD-10-CM | POA: Diagnosis present

## 2011-01-16 DIAGNOSIS — I4891 Unspecified atrial fibrillation: Secondary | ICD-10-CM

## 2011-01-16 DIAGNOSIS — I509 Heart failure, unspecified: Secondary | ICD-10-CM | POA: Diagnosis present

## 2011-01-16 LAB — POCT ACTIVATED CLOTTING TIME
Activated Clotting Time: 252 seconds
Activated Clotting Time: 258 seconds

## 2011-01-16 LAB — MRSA PCR SCREENING: MRSA by PCR: NEGATIVE

## 2011-01-16 NOTE — Cardiovascular Report (Signed)
NAME:  EZEKIAL, [name]                      ACCOUNT NO.:  [id number]   MEDICAL RECORD NO.:  [id number]                   PATIENT TYPE:  OIB   LOCATION:  6501                                 FACILITY:  [facility]   PHYSICIAN:  Charlies Constable, M.D. [facility]              DATE OF BIRTH:  11-01-1960   DATE OF PROCEDURE:  02/08/2004  DATE OF DISCHARGE:  02/08/2004                              CARDIAC CATHETERIZATION   CLINICAL HISTORY:  [name] is 50 years old.  He has a history of  pulmonary embolus and a history of paroxysmal atrial fibrillation.  He had  previously been on Coumadin for his pulmonary embolism but is no longer on  this.  Recently he has had episodes of chest pain and was seen by Dr. Graciela Husbands  and then Tereso Newcomer and a decision was made to evaluate him with  catheterization.   PROCEDURE:  The procedure was performed via the right femoral artery using  an arterial sheath and 6-French preformed coronary catheters.  __________  contrast used.  A distal aortogram was performed to rule out abdominal  aortic aneurysm.  The patient tolerated the procedure well and left the  laboratory in satisfactory condition.   RESULTS:  1. Left main coronary artery.  The left main coronary artery was a short     vessel, it was free of significant disease.  2. Left anterior descending artery.  The left anterior descending artery     gave rise to three diagonal branches and a septal perforator.  These were     free of significant disease.  3. Circumflex artery.  The circumflex artery gave rise to an atrial branch,     a small marginal branch and two small posterolateral branches.  There was     30% stenosis in the mid vessel.  4. Right coronary artery.  The right coronary artery was a moderate sized     vessel, it gave rise to a marginal branch, a posterior descending branch     and a posterolateral branch.  There was 50% narrowing at the ostium of     the marginal branch.  5. Left  ventriculogram.  The left ventriculogram performed in the RAO     projection showed mild global hypokinesis.  The estimated ejection     fraction was 40-45%.  6. Distal aortogram.  Distal aortogram was performed which showed patent     renal arteries and no significant iliac obstruction.  7. Aortic pressure was 133/86 with a mean of 105.  Left index pressure was     133/8.   CONCLUSION:  Nonobstructive coronary artery disease with 30% narrowing in  the mid circumflex artery, no significant obstruction in the left anterior  descending artery, 50% narrowing in the acute marginal branch of the right  coronary artery and mild global hypokinesis.   RECOMMENDATION:  Patient has nonobstructive coronary disease.  We should  plan secondary risk factor  modification.  Will get a followup echocardiogram  because of the mild global left ventricular function and arrange followup  with Dr. Graciela Husbands and Tereso Newcomer.                                               Charlies Constable, M.D. [facility]    BB/MEDQ  D:  02/08/2004  T:  02/10/2004  Job:  [id number]   cc:   Duke Salvia, M.D.   Cardiopulmonary Lab

## 2011-01-16 NOTE — Discharge Summary (Signed)
NAME:  [name], [name]                      ACCOUNT NO.:  [id number]   MEDICAL RECORD NO.:  [id number]                   PATIENT TYPE:  INP   LOCATION:  0374                                 [facility]ACILITY:  [facility]   PHYSICIAN:  Veneda Melter, M.D.                   DATE OF BIRTH:  06/19/1961   DATE OF ADMISSION:  04/18/2003  DATE OF DISCHARGE:  04/26/2003                           DISCHARGE SUMMARY - REFERRING   DISCHARGE DIAGNOSES:  1. Admitted with chest pain.  Negative Cardiolite study.  Negative enzymes.     Bilateral basilar pulmonary emboli on CT of the chest.  No evidence of     cardiac source.  No deep venous thrombosis.  Full coagulation work-up     initiated.  2. Continued Bactrim DS treatment for history of recent penile discharge,     question prostatitis.  3. Finding of dyslipidemia this admission.  4. Second degree AV block.   SECONDARY DIAGNOSES:  1. Strong family history of coronary artery disease on his mother's side.  2. History of peptic ulcer disease.  3. Gastroesophageal reflux disease.  4. 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  5. History of cocaine abuse.  Hospitalized x2 at [facility].  No     drug use since 1999.  Drug screen on admission was negative.   PROCEDURE:  1. April 18, 2003:  Echocardiogram.  The study showed ejection fraction 55-     65%, normal systolic function, no wall motion abnormalities.  2. April 18, 2003:  Computed tomogram of the chest.  The study showed     bilateral small basilar pulmonary emboli.  3. April 19, 2003:  Ultrasound lower extremities negative for deep venous     thrombosis.  4. April 20, 2003:  Exercise Cardiolite study:  Mild hypokinesis of the     inferior wall.  No evidence of ischemia.   DISCHARGE DISPOSITION:  [name] has been placed on IV heparin  after finding of bilateral pulmonary emboli.  He has now been therapeutic on  Coumadin which was  started August 22 for the last 24 hours.  His discharge  studies were PT 21.5, INR 2.5.  Heparin will be discontinued and he will go  home on Coumadin 5 mg daily with follow-up Dr. Shelby Dubin on Monday, August  30 to correct his dosage if necessary.  The patient has been afebrile this  hospitalization.  He has had some mild complaints of chest pain on and off  throughout the hospitalization.  His electrocardiogram showed evidence of  second degree AV block but his monitor tracings have actually stabilized.  On admission he had multiple PVCs, in some cases with bigemini.  Mr.  [name] has had no experience with respiratory distress.  He has been  ambulating independently.  His mental status is clear.  He goes  home on the  following medications.   DISCHARGE MEDICATIONS:  1. Bactrim DS one tablet b.i.d.  2. Coumadin 5 mg daily or as directed by Dr. Shelby Dubin.   ACTIVITY:  Without restriction.   DIET:  Low sodium, low cholesterol diet.   DISCHARGE INSTRUCTIONS:  He is asked to cease smoking and to follow up with  Maretta Bees. Vonita Moss, M.D., his urologist.  Dr. Shelby Dubin will see him August  30 at [facility] and he will have an appointment with Veneda Melter,  M.D. physician's assistant to be arranged.  The [facility] office  will call with that appointment.   BRIEF HISTORY:  [name] is a 50 year old male who [facility] with substernal chest  discomfort of unclear etiology.  Pain does appear to be somewhat positional  and reducible with palpitation.  He does not have associated symptoms to  suggest acute coronary syndrome.  However, the patient does have a strong  family history of premature coronary artery disease on his mother's side.  He has a history of ongoing tobacco use.  He has unknown lipid status.  Serial cardiac enzymes will be obtained to rule out injury (and, in fact,  they were negative).  His  dysrhythmia will be followed on telemetry.  Peptic  ulcer and reflux disease will be treated with proton pump inhibitor.  Also,  a CAT scan of the chest to rule out pulmonary embolus or dissection (emboli  were found).  Other considerations can include pericarditis.  However, he  does not have the EKG changes to suggest this.  A 2-D echocardiogram will be  performed to assess for wall motion abnormalities or valvular insufficiency.  A drug screen will be obtained (it was negative).  Amylase and lipase will  be checked to rule out pancreatitis (they were normal).  We will continue  his Bactrim for penile discharge prescribed by Maretta Bees. Vonita Moss, M.D.  He  will also be started on nonsteroidal anti-inflammatory agents for possible  musculoskeletal discomfort.  We also plan on proceeding with exercise  Cardiolite study.   HOSPITAL COURSE:  After admission August 18 with chest pain of unclear  etiology, a full set of cardiac enzymes were negative.  2-D echocardiogram  showed no wall motion abnormalities and an ejection fraction 55-65%.  Computed tomogram of the chest did show bilateral basilar pulmonary emboli  and he was started on IV heparin and Coumadin therapy.  He also underwent  Cardiolite exercise study which showed no evidence of ischemia.  The  echocardiogram showed no evidence of cardiac source of mural thrombus and  ultrasound of the lower extremities was negative for DVT.  Full  anticoagulation work-up was initiated.  These laboratories will now be  dictated.   DISCHARGE LABORATORIES:  On discharge his PT was 21.5, INR 2.5, PTT 151.  His complete blood count on August 26:  White blood cells 8.1, hemoglobin  16, hematocrit 47.2, platelets 233,000.  Lipid panel:  Cholesterol 202,  triglycerides 63, HDL cholesterol 45, LDL cholesterol 144.  TSH 0.342.  Clotting studies:  Factor V is pending.  Antiphospholipid 89.2 (reference range 33.8-48.8), dRVTT 48.9 (reference range 36.8-48.4),  PTTLA 0.0 (range  less than 8.0), protein C 108, protein S 116.  Lupus anticoagulant:  None  detected.  Antithrombin III 97 (75-120).     Maple Mirza, P.A.  Veneda Melter, M.D.    GM/MEDQ  D:  04/26/2003  T:  04/26/2003  Job:  [id number]   cc:   Maretta Bees. Vonita Moss, M.D.  [address]  [place]  [place] [address]  Fax: [phone]

## 2011-01-16 NOTE — H&P (Signed)
   NAME:  [name], [name]                      ACCOUNT NO.:  [id number]   MEDICAL RECORD NO.:  [id number]                   PATIENT TYPE:  INP   LOCATION:  0374                                 FACILITY:  [facility]   PHYSICIAN:  Veneda Melter, M.D.                   DATE OF BIRTH:  [birth year]   DATE OF ADMISSION:  04/18/2003  DATE OF DISCHARGE:                                HISTORY & PHYSICAL   HISTORY:  [name] is a 50 year old black gentleman who presents for  assessment of chest discomfort.  The patient reports an episode in November  2003 where he was assessed in the emergency department and told this was  musculoskeletal and discharged.  He has done well in the interim.  However,  the past four days he has had persistent substernal chest discomfort which  starts in the mid left sternum and radiates to the axilla.  This is not  associated with dyspnea, diaphoresis, or lightheadedness, no nausea or  vomiting.  Occasionally there is radiation to the back.  It does appear to  be worse with position such as sitting up or turning onto his left side.  He  has not had fevers, chills, cough.  Constitutional symptoms are negative.  He does feel the pain is worse with deep palpation.  He has had a history of  reflux disease but feels that this is different from his prior indigestion.  Due to the persistence of pain, he presented to the emergency room where he  was found to have significant bradycardia and persistence of pain despite  Toradol and he is referred for admission.   PAST MEDICAL HISTORY:  Known for peptic ulcer disease with reflux, history  of a seizure in childhood, recent history of a penile discharge for which he  has been on Bactrim, traumatic amputation of second to fifth digits of the  right hand 1980s, and prior hospitalization for dehydration and cocaine  abuse.   The dictation ended at this point.                                               Veneda Melter,  M.D.    NG/MEDQ  D:  04/18/2003  T:  04/18/2003  Job:  [id number]   cc:   Maretta Bees. Vonita Moss, M.D.  [address]  [place]  [place] [address]  Fax: [phone]

## 2011-01-16 NOTE — H&P (Signed)
NAME:  [name], [name]                      ACCOUNT NO.:  [id number]   MEDICAL RECORD NO.:  [id number]                   PATIENT TYPE:  INP   LOCATION:  0374                                 FACILITY:  [facility]   PHYSICIAN:  Veneda Melter, M.D.                   DATE OF BIRTH:  28-Jan-1961   DATE OF ADMISSION:  04/18/2003  DATE OF DISCHARGE:                                HISTORY & PHYSICAL   ADDENDUM   PHYSICAL EXAMINATION:  GENERAL:  On examination [name] is a well-  developed, well-nourished black gentleman in no acute distress.  VITAL SIGNS:  Temperature is 97.4, pulse  47, respirations 18, blood  pressure 122/84.  HEENT:  Pupils are equal, round, and reactive to light.  Extraocular muscles  are intact.  Oropharynx:  There is no lesion.  NECK:  Supple.  No adenopathy, no bruit.  HEART:  Reveals a regular rate with frequent ectopy.  LUNGS:  Clear to auscultation.  ABDOMEN:  Soft, nontender.  EXTREMITIES:  No peripheral edema.  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  NEUROLOGIC:  Motor strength is grossly intact.  Sensory is intact as well.   LABORATORY DATA:  Chest x-ray shows slight cardiac enlargement.  No  infiltration or effusions.  ECG shows sinus bradycardia at 47, PVC with  secondary type 2 block.   White count is 6, hemoglobin 14.4, hematocrit 42.2, platelets 245.  Sodium  is 137, potassium 3.9, chloride 110, bicarb 25, BUN 18, creatinine 1.2,  glucose 97.   ASSESSMENT/PLAN:  [name] is a 50 year old gentleman who presents with  substernal chest discomfort of unclear etiology.  The pain does appear to be  somewhat positional and reducible with palpation and does not have associate  symptoms to suggest acute coronary syndrome.  However the patient does have  a strong family history of early coronary disease on his mother's side as  well as a history of ongoing tobacco use.  He is unknown lipid status.  We  will plan on obtaining serial cardiac enzymes to rule out injury.  Will  follow his dysrhythmia on telemetry.  We will treat his peptic ulcer and  reflux disease with proton pump inhibitor.  We will also obtain a CAT scan  of his chest to rule out pulmonary embolus or dissection, given the  radiation of his pain to the back, although this appears unlikely.  Other  considerations include pericarditis, however, he does not have ECG changes  to suggest this.  His white count and temperature are normal.  A 2-D echo  will be performed to assess for this as well as for any wall motion  abnormalities or valvular insufficiency.  Other considerations include  occult drug use.  Will obtain a urine drug screen.  LFTs are within normal  limits, although we will check an amylase and lipase  to rule out  pancreatitis, given his history of alcohol use, although his abdomen is  benign.  He is on Bactrim for penile discharge, will continue this.  Will  also start him on nonsteroidal anti-inflammatory agents for possible  musculoskeletal discomfort.  We will plan on proceeding with a treadmill cardio study to determine his  chronotropic competence as well as to rule out coronary disease.  We will  then follow the patient symptomatically and determine if further workup is  indicated or if the patient can be followed conservatively.                                                  Veneda Melter, M.D.    NG/MEDQ  D:  04/18/2003  T:  04/18/2003  Job:  [id number]

## 2011-01-16 NOTE — Discharge Summary (Signed)
   NAME:  [name], [name]                      ACCOUNT NO.:  [id number]   MEDICAL RECORD NO.:  [id number]                   PATIENT TYPE:  INP   LOCATION:  0374                                 FACILITY:  [facility]   PHYSICIAN:  Veneda Melter, M.D.                   DATE OF BIRTH:  1961-02-03   DATE OF ADMISSION:  04/18/2003  DATE OF DISCHARGE:  04/26/2003                           DISCHARGE SUMMARY - REFERRING   ADDENDUM:  Coumadin dosing to be continued for three months after discharge.     Maple Mirza, P.A.                    Veneda Melter, M.D.    GM/MEDQ  D:  04/26/2003  T:  04/26/2003  Job:  [id number]

## 2011-01-17 ENCOUNTER — Other Ambulatory Visit: Payer: Self-pay | Admitting: Nurse Practitioner

## 2011-01-23 NOTE — Op Note (Signed)
[name], [name]            ACCOUNT NO.:  [id number]  MEDICAL RECORD NO.:  [id number]           PATIENT TYPE:  I  LOCATION:  2922                         FACILITY:  [facility]  PHYSICIAN:  Hillis Range, MD       DATE OF BIRTH:  January 30, 1961  DATE OF PROCEDURE: DATE OF DISCHARGE:                              OPERATIVE REPORT   SURGEON:  Hillis Range, MD  PREPROCEDURE DIAGNOSES: 1. Paroxysmal atrial fibrillation. 2. Sinus node dysfunction, status post prior pacemaker implantation. 3. Nonischemic cardiomyopathy.  POSTPROCEDURE DIAGNOSES: 1. Paroxysmal atrial fibrillation. 2. Sinus node dysfunction, status post prior pacemaker implantation. 3. Nonischemic cardiomyopathy.  PROCEDURES: 1. Comprehensive EP study. 2. Coronary sinus pacing and recording. 3. 3-D mapping of SVT 4. Radiofrequency ablation of SVT. 5. Arterial blood pressure monitoring. 6. Intracardiac echocardiography. 7. Transseptal puncture with intact septum. 8. Pulmonary vein venography. 9. Isoproterenol infusion. 10.Single chamber pacemaker interrogation with reprogramming.  INTRODUCTION:  [name] is a pleasant 50 year old gentleman with a history of symptomatic paroxysmal atrial fibrillation.  He has previously failing medical therapy with Tikosyn.  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  He presents today for EP study and radiofrequency ablation for atrial fibrillation.  DESCRIPTION OF PROCEDURE:  Informed written consent was obtained and the patient was brought to the Electrophysiology Lab in a fasting state, he was adequately sedated with intravenous medications as outlined in the anesthesia report.  The patient's left and right groins were prepped and draped in the usual sterile fashion by the EP lab staff.  Using a percutaneous Seldinger technique, two  7-French and one 8-French hemostasis sheets were placed in the right common femoral vein.  A 4- [place] hemostasis sheath was placed in the right common femoral artery for blood pressure monitoring.  An 11-French hemostasis sheath was placed into the left common femoral vein.  A 7-French Biosense Webster decapolar coronary sinus catheter was introduced through the right common femoral vein and advanced into the coronary sinus for recording and pacing from this location.  A 6-French quadripolar Josephson catheter was introduced into the right common femoral vein and advanced into the right ventricle for recording and pacing.  This catheter was then pulled back to the His bundle location.  The patient presented to the Electrophysiology Lab in atrial fibrillation.  He spontaneously converted, however, to sinus rhythm.  His PR interval measured 140 milliseconds with a QRS duration of 106 milliseconds and a QT interval of 409 milliseconds.  His AH interval measured 73 milliseconds with an HV interval of 53 milliseconds.  Ventricular pacing was performed which revealed VA dissociation at baseline when pacing at a cycle length of 600 milliseconds.  A 10-French Biosense Webster AcuNav intracardiac echocardiography catheter was introduced through the left common femoral vein and advanced into the right atrium.  Intracardiac echocardiography was performed.  This demonstrated a moderately enlarged left atrium. The patient was noted to have 4 pulmonary veins with separate ostia to each.  There was no evidence of pulmonary vein stenosis.  The middle right  common femoral vein sheath was exchanged for an 8.5 [place] SL2 transseptal sheath and transseptal access was achieved in a standard fashion using a Brockenbrough needle under biplane fluoroscopy with intracardiac echocardiography confirmation of the transseptal puncture. Once transseptal access had been achieved, heparin was administered intravenously  and intra-arterial in order to maintain an ACT of greater than 300 seconds throughout the procedure.  A 6-French multipurpose angiographic catheter with guidewire was introduced through the transseptal sheath and positioned over the mouth of all 4 pulmonary veins.  Pulmonary venograms were performed by hand injection of nonionic contrast and demonstrated that the patient had a moderate-sized left superior and left inferior pulmonary veins.  There was also a left middle pulmonary vein which appeared to arise just off of the ostium of the left superior pulmonary vein.  The patient was noted to have a very large right superior pulmonary vein as well as a large right inferior pulmonary vein.  The angiographic catheter was then removed.  The His bundle catheter was removed and in its place a 3.5 mm Biosense Webster EZ [place] ThermoCool catheter was advanced into the right atrium.  The transseptal sheath was pulled back into the IVC over a guidewire.  The ablation catheter was advanced across a transseptal hole using the wire as a guide.  The transseptal sheath was then re-advanced over the guidewire into the left atrium.  A duodecapolar circular mapping catheter was introduced through the transseptal sheath and positioned over the mouth of all 4 pulmonary veins.  Three-dimensional electroanatomical mapping was performed using [employer].  This demonstrated electrical activity within all 4 pulmonary veins at baseline.  The patient was noticed to have prodigious conduction with frequent firing of atrial fibrillation arising from the right superior pulmonary vein.  This vein was noted to be quite large as previously described.  The patient was converted to atrial fibrillation quite frequently with catheter manipulation within the right superior pulmonary vein.  The patient therefore underwent successful sequential electrical isolation and anatomical encircling of all 4 pulmonary veins using  radiofrequency current with a circular mapping catheter as a guide.  The left middle pulmonary vein was isolated with the left superior pulmonary vein at its antrum.  There was a tiny right middle vein which came off of the right inferior pulmonary vein which was also isolated with the right inferior pulmonary vein at its antrum.  The patient converted to atrial fibrillation, however, with isolation of the right superior pulmonary vein, the patient converted to sinus rhythm. He remained in sinus rhythm thereafter.  Following ablation, isoproterenol was infused up to 10 mcg per minute with an adequate acceleration and heart rate response.  During isoproterenol infusion, all 4 pulmonary veins were again evaluated and found to be electrically isolated.  During isoproterenol washout, rapid atrial pacing was performed down to a cycle length of 250 milliseconds with no arrhythmias observed.  The AV Wenckebach cycle length was 310 milliseconds with no evidence of PR greater than RR.  The patient remained in sinus rhythm thereafter.  The procedure was therefore considered completed.  All catheters were removed and the sheaths were aspirated and flushed.  A limited bedside transthoracic echocardiogram was performed which revealed no pericardial effusion.  The patient's pacemaker was interrogated and confirmed to be a Medtronic Adapta L model ADDRL1 implanted on June 22, 2008, with the battery longevity of 13 years and an impedance of  175 ohms.  This is a single lead device with a single atrial lead in  place with an impedance 456 ohms.  The P-waves measured 2-2.8 mV with a threshold of 0.5 volts at 0.4 milliseconds. All counters were cleared today.  The patient was programmed to AAI with a low rate limit of 50 as previously programmed.  The patient was transferred to the recovery area for sheath removal per protocol.  There were no apparent complications.  CONCLUSIONS: 1. Paroxysmal atrial  fibrillation upon presentation. 2. The patient was noted to have a very large right superior pulmonary     vein as well as a large right inferior pulmonary vein.  The left     superior and left inferior pulmonary veins were moderate in size.     There was a left middle as well as a right middle pulmonary vein     which arose from the left superior pulmonary vein and right     inferior pulmonary vein respectively.  The patient was noted to     have prodigious conduction from the right superior pulmonary vein     which was felt to be his trigger for atrial fibrillation. 3. Successful electrical isolation and anatomical encircling of all 4     pulmonary veins using radiofrequency current. 4. No inducible arrhythmias with isoproterenol infusion following the     ablation up to 10 mcg per minute. 5. Pacemaker interrogation following ablation reveals stable lead     measurements. 6. No early apparent complications.     Hillis Range, MD     JA/MEDQ  D:  01/16/2011  T:  01/17/2011  Job:  [id number]  cc:   Duke Salvia, MD, [facility] Corwin Levins, MD  Electronically Signed by Hillis Range MD on 01/23/2011 05:54:07 PM

## 2011-01-23 NOTE — Discharge Summary (Signed)
NAMEPAYSON, EVRARD            ACCOUNT NO.:  [id number]  MEDICAL RECORD NO.:  [id number]           PATIENT TYPE:  I  LOCATION:  2922                         FACILITY:  [facility]  PHYSICIAN:  Hillis Range, MD       DATE OF BIRTH:  [birth year]  DATE OF ADMISSION:  01/16/2011 DATE OF DISCHARGE:  01/17/2011                              DISCHARGE SUMMARY   ADMISSION DIAGNOSIS:  Paroxysmal atrial fibrillation.  SECONDARY DIAGNOSES: 1. Sinus node dysfunction status post prior pacemaker implantation. 2. Nonischemic cardiomyopathy. 3. New York Heart Association class II/III congestive heart failure.  PROCEDURES:  EP study and radiofrequency ablation for atrial fibrillation.  CONSULTATIONS:  None.  HISTORY OF PRESENT ILLNESS:  Mr. Mathey is a pleasant 50 year old gentleman with a history of symptomatic paroxysmal atrial fibrillation. He has previously failed medical therapy with Tikosyn.  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  He therefore presents for EP study and radiofrequency ablation for atrial fibrillation.  HOSPITAL COURSE:  Informed written consent was obtained and the patient was brought to the electrophysiology lab.  He was initially found to be in atrial fibrillation but spontaneously converted to sinus rhythm.  He underwent electrical isolation and anatomical encircling of all 4 pulmonary veins using radiofrequency current.  His pulmonary veins were noted to be quite large.  He had a left middle pulmonary vein off of the left superior pulmonary vein as well as a right middle pulmonary vein off of the right inferior pulmonary vein.  These veins were electrically isolated along the antrum of the left superior pulmonary vein and right inferior pulmonary veins respectively.  The patient was also noted to have prodigious  conduction within the right superior pulmonary vein and this vein was felt to be responsible for his atrial fibrillation.  Upon isolation of the right superior pulmonary vein, the patient's atrial fibrillation became quite quiescent.  After isolation of all 4 pulmonary veins, isoproterenol was infused and no other arrhythmias were observed. The patient was observed overnight without early apparent complications. At time of discharge, the patient was alert, ambulatory, and otherwise without complaint.  DISPOSITION:  Home.  DISCHARGE CONDITION:  Good.  DISCHARGE DIET:  Cardiac prudent.  DISCHARGE INSTRUCTIONS:  Increase activities as tolerated.  No driving for 4 days.  DISCHARGE MEDICATIONS: 1. Pradaxa 150 mg twice daily. 2. Tikosyn 500 mg twice daily. 3. Carvedilol 25 mg twice daily. 4. Colace 100 mg twice daily. 5. Simvastatin 40 mg at bedtime. 6. Potassium chloride 20 mEq daily. 7. Omeprazole 40 mg daily. 8. Methimazole 10 mg daily. 9. Oxycodone/acetaminophen 5/325 mg twice daily p.r.n. 10.Levitra 20 mg p.r.n. 11.Gabapentin 100 mg daily. 12.Flonase 2 sprays each nostril daily. 13.Losartan 50 mg daily. 14.Aspirin is discontinued.  FOLLOWUP:  The patient will follow up with me in 12 weeks.     Hillis Range, MD     JA/MEDQ  D:  01/17/2011  T:  01/17/2011  Job:  [id number]  cc:   Duke Salvia, MD, [facility]  Electronically Signed by  Hillis Range MD on 01/23/2011 05:54:10 PM

## 2011-01-27 ENCOUNTER — Telehealth: Payer: Self-pay | Admitting: Pulmonary Disease

## 2011-01-27 NOTE — Telephone Encounter (Signed)
Called and spoke with pt.  Pt states his cpap machine hasn't been working properly the last couple of months.  Pt states he is having dry mouth despite increasing humidifier.  Pt states the dry mouth keeps him up at night so he is very sleepy during the day and states he will sleep "1/2 the day away."  Pt states DME company came out today and switched to a new humidifier to see if that will help.  Pt is requesting KC's recs.

## 2011-01-27 NOTE — Telephone Encounter (Signed)
LMOMTCB x 1 

## 2011-01-27 NOTE — Telephone Encounter (Signed)
Would give the new humidifier a chance to see if that is the problem.  Dont forget to increase the heat on the humidifier to maximize moisture.  Let me know in 3-4 days if he feels still not getting enough moisture.

## 2011-01-28 NOTE — Telephone Encounter (Signed)
Pt advised of KC recs. Carron Curie, CMA

## 2011-01-30 ENCOUNTER — Other Ambulatory Visit: Payer: Self-pay | Admitting: Endocrinology

## 2011-02-03 ENCOUNTER — Other Ambulatory Visit: Payer: Self-pay | Admitting: Orthopaedic Surgery

## 2011-02-03 ENCOUNTER — Other Ambulatory Visit: Payer: Medicaid Other

## 2011-02-03 DIAGNOSIS — M25511 Pain in right shoulder: Secondary | ICD-10-CM

## 2011-02-05 ENCOUNTER — Telehealth: Payer: Self-pay | Admitting: Internal Medicine

## 2011-02-05 NOTE — Telephone Encounter (Signed)
Needs to have a CT-A of rt shoulder at [facility] [facility]  Will need to hold Pradaxa for 2 days prior to this  Just had ablation on 01/17/11  Spoke with Toniann Fail let her know I would discuss with Dr Johney Frame and get back with her

## 2011-02-05 NOTE — Telephone Encounter (Signed)
Called [name] at lunch and will be back around 12:30  Patient had afib ablation on 01/17/11

## 2011-02-05 NOTE — Telephone Encounter (Signed)
Toniann Fail from Dr Magnus Ivan office calling re pt pradaxa for two days for a procedure week.

## 2011-02-05 NOTE — Telephone Encounter (Signed)
Discussed with Dr Johney Frame  Patient may not stop Pradaxa until 2 months post ablation  [facility] aware and will notify patient and doctor

## 2011-02-16 ENCOUNTER — Encounter (INDEPENDENT_AMBULATORY_CARE_PROVIDER_SITE_OTHER): Payer: Self-pay | Admitting: General Surgery

## 2011-02-16 ENCOUNTER — Telehealth: Payer: Self-pay

## 2011-02-16 NOTE — Telephone Encounter (Signed)
ROI Faxed to [employer] @ [phone]   02/16/11/km

## 2011-02-26 DIAGNOSIS — L309 Dermatitis, unspecified: Secondary | ICD-10-CM | POA: Insufficient documentation

## 2011-03-02 ENCOUNTER — Telehealth: Payer: Self-pay | Admitting: Internal Medicine

## 2011-03-02 NOTE — Telephone Encounter (Signed)
I spoke with the patient and made him aware I will print office notes from the last year and any hospital procedures he has had done. He will come pick those up in the office tomorrow.

## 2011-03-02 NOTE — Telephone Encounter (Signed)
Pt calling wanting to talk to someone about medical records. Pt has spoken with medical records and has gotten them before. Pt said now medical records dept is trying to charge him for medical records. Pt said last time nurse helped him to obtain copy of medical records.

## 2011-03-05 ENCOUNTER — Encounter: Payer: Medicaid Other | Admitting: *Deleted

## 2011-03-05 ENCOUNTER — Ambulatory Visit (INDEPENDENT_AMBULATORY_CARE_PROVIDER_SITE_OTHER): Payer: Medicaid Other | Admitting: Pulmonary Disease

## 2011-03-05 ENCOUNTER — Encounter: Payer: Self-pay | Admitting: Pulmonary Disease

## 2011-03-05 VITALS — BP 104/66 | HR 53 | Temp 97.9°F | Ht 71.0 in | Wt 267.2 lb

## 2011-03-05 DIAGNOSIS — G4733 Obstructive sleep apnea (adult) (pediatric): Secondary | ICD-10-CM

## 2011-03-05 NOTE — Patient Instructions (Addendum)
Will have your equipment company get you a cover for your cpap hose Keep hydrated. Will refill your astelin, but keep in mind this can contribute to dryness. Can try biotin gel to use at night before putting on cpap.  Will recheck your pressure on auto mode for a few weeks.  Will call you with results.  followup with me in one year if doing ok, but please call me if not doing well.

## 2011-03-05 NOTE — Progress Notes (Signed)
  Subjective:    Patient ID: [name], male    DOB: [birth year], 50 y.o.   MRN: [id number]  HPI The pt comes in today with cpap issues.  He is having no issues with mask or pressure, but continues to have severe dryness during the night which awakens him frequently.  He is tired in the am's upon arising despite being in bed for 8 hrs.  His humidifier is set at max, and does use water.  However, he admits that he keeps his room very cold at night.  He keeps his hydration up during the night.    Review of Systems  Constitutional: Negative for fever and unexpected weight change.  HENT: Positive for congestion. Negative for ear pain, nosebleeds, sore throat, rhinorrhea, sneezing, trouble swallowing, dental problem, postnasal drip and sinus pressure.   Eyes: Negative for redness and itching.  Respiratory: Negative for cough, chest tightness, shortness of breath and wheezing.   Cardiovascular: Negative for palpitations and leg swelling.  Gastrointestinal: Negative for nausea and vomiting.  Genitourinary: Negative for dysuria.  Musculoskeletal: Negative for joint swelling.  Skin: Positive for rash.  Neurological: Negative for headaches.  Hematological: Does not bruise/bleed easily.  Psychiatric/Behavioral: Negative for dysphoric mood. The patient is not nervous/anxious.        Objective:   Physical Exam Ow male in nad No skin breakdown or pressure necrosis from cpap mask Chest clear to ausultation Cor sounds regular LE without edema, no cyanosis Alert, oriented, moves all 4        Assessment & Plan:

## 2011-03-06 NOTE — Assessment & Plan Note (Signed)
The pt continues to have dryness issues that he feels keeps him awake at night and makes him sleepy during the day.  He has his humidifier turned all the way up, and I wonder if part of the issue is the cold temps in his bedroom at night.  He may benefit from a warming sleeve on his hose to prevent condensation.  Because he is also c/o nonrestorative sleep, will need to re-optimize his pressure on auto as well. I have encouraged him to work on weight loss.

## 2011-03-09 ENCOUNTER — Encounter: Payer: Self-pay | Admitting: *Deleted

## 2011-03-16 ENCOUNTER — Telehealth: Payer: Self-pay | Admitting: Pulmonary Disease

## 2011-03-16 MED ORDER — AZELASTINE HCL 0.1 % NA SOLN: 1.0000 | Freq: Two times a day (BID) | NASAL | Status: DC

## 2011-03-16 NOTE — Telephone Encounter (Signed)
Called and spoke with patient and he is aware that [facility] should call him tomorrow to get this scheduled. Pt is aware to call me back if he doesn't hear from [facility] on Tues.

## 2011-03-16 NOTE — Telephone Encounter (Signed)
Astepro refill sent to pt's pharmacy. Will forward msg to PCC's so they may check with [facility] on CPAP auto download. Pt is aware.

## 2011-03-16 NOTE — Telephone Encounter (Signed)
LM for [name] at [place] to return my call in reference to status of order that was faxed on 03/05/11.

## 2011-03-16 NOTE — Telephone Encounter (Signed)
Spoke with anita at [facility] and she stated that [facility] had not received any order for this patient. Re-faxed order and patient will be called tomorrow, Tues. 03/17/11 per Synetta Fail at [place].

## 2011-03-18 ENCOUNTER — Telehealth: Payer: Self-pay | Admitting: *Deleted

## 2011-03-18 NOTE — Telephone Encounter (Signed)
Brand name is covered under insurance plan-left message to go over this with patient; Will need to let pharmacy know this as well.

## 2011-03-19 ENCOUNTER — Other Ambulatory Visit: Payer: Self-pay | Admitting: Internal Medicine

## 2011-03-19 NOTE — Telephone Encounter (Signed)
I called and spoke with pts pharmacy-states that the insurance will NOT cover Brand as I was told previously; [facility] please advise if its okay to change to Fluticasone. Thanks.

## 2011-03-20 NOTE — Telephone Encounter (Signed)
I don';t understand what the issue is here.  astepro is a totally different drug class that flonase.  They are not interchangeable.  Are you saying they will or will not cover name brand astepro?  Azelastine?? (generic).  Please clarify.

## 2011-03-23 NOTE — Telephone Encounter (Signed)
Per [employer] PA for [employer]

## 2011-03-24 MED ORDER — AZELASTINE HCL 0.1 % NA SOLN: 1.0000 | Freq: Two times a day (BID) | NASAL | Status: DC

## 2011-03-24 NOTE — Telephone Encounter (Signed)
Pharmacist aware that per ins BRAND NAME ASTEPRO is covered and new rx sent to the pharmacy. They will contact our office if there is a problem with the claim.

## 2011-04-02 ENCOUNTER — Encounter (INDEPENDENT_AMBULATORY_CARE_PROVIDER_SITE_OTHER): Payer: Self-pay | Admitting: General Surgery

## 2011-04-08 ENCOUNTER — Other Ambulatory Visit: Payer: Self-pay | Admitting: Internal Medicine

## 2011-04-08 ENCOUNTER — Encounter (INDEPENDENT_AMBULATORY_CARE_PROVIDER_SITE_OTHER): Payer: Self-pay | Admitting: General Surgery

## 2011-04-09 ENCOUNTER — Encounter (INDEPENDENT_AMBULATORY_CARE_PROVIDER_SITE_OTHER): Payer: Self-pay | Admitting: General Surgery

## 2011-04-09 ENCOUNTER — Ambulatory Visit (INDEPENDENT_AMBULATORY_CARE_PROVIDER_SITE_OTHER): Payer: Medicaid Other | Admitting: General Surgery

## 2011-04-09 VITALS — BP 128/92 | HR 42 | Temp 97.8°F | Ht 71.0 in | Wt 268.2 lb

## 2011-04-09 DIAGNOSIS — E669 Obesity, unspecified: Secondary | ICD-10-CM

## 2011-04-09 DIAGNOSIS — K648 Other hemorrhoids: Secondary | ICD-10-CM

## 2011-04-09 DIAGNOSIS — K602 Anal fissure, unspecified: Secondary | ICD-10-CM

## 2011-04-09 NOTE — Progress Notes (Signed)
Subjective:     Patient ID: [name], male   DOB: 01-23-1961, 50 y.o.   MRN: [id number]  HPIThe patient is here for container and anal discomfort and bleed in and the same of Dr. Andrey Campanile on [phone] with the following findings he is medically disabled because of chronic back pain and other problems on Percocet for regular basis and on Dr. Tawana Scale exam his symptoms aside and more like an anal fissure and he did have first degree internal hemorrhoids the patient states that he uses suppositories, manually manipulations because of the constipation to have a bowel movement and he uses suppositories or chronic basis he is an urgent office today for bleeding but it appears the bleeding was 3 or 4 days earlier and not to do   Review of Systems     Objective:   Physical ExamThe patient is a heavy blood cooperative male in no acute distress at this time in my exam is predominantly limited to his anal rectal exam on external expections is no external hemorrhoids and with the patient straining you can see no prolapse and hemorrhoids today he does have an area posterior that looks as if he's had an acute fissure that's probably healed on digital exam he is not an anal spasm and he's not constipated at this time. Anoscopic exam shows first-degree hemorrhoids in all 3 quadrants but none are actively bleeding and I cannot get any 2 prolapse.  With a history of previous pain and bleeding and cervical days ago I expected this is a birth secondary to manual manipulation from upon the suppositories or a fissure which has healed he has had a colonoscopy recently by Dr. Sheryn Bison that showed only mild internal hemorrhoids.    Assessment:    I expect the patient has had a superficial posterior anal fissure and whether the source of bleeding once the fissure or possibly irritation to an internal hemorrhoid I could not determine there is no blood on the examining finger or endoscopic inspection at this time and  I didn't make that his chronic constipation is probably secondary to his multiple medications as Percocet his psychiatric medication all foot cannot be stopped for this reason I would increase his laxative use and would recommend MiraLax one daily basis in the morning and not used the suppositories for which he uses frequently when he gets constipated he says his back problems and L4-5 so to lower back problem and it might be causing some pain to the perianal area but he does not appear to be in spasm and etc. like a severe back problem on exam today     Plan:    Tried to decrease the use of narcotics for pain management if possible and try to increase her physical activity start taking MiraLax on a daily basis would recommend it be taken in the morning and stop the use of suppositories and manual manipulation to stimulate bowel function if you have anal from the fissure that appears healed at this time lidocaine ointment could be used.

## 2011-04-09 NOTE — Patient Instructions (Signed)
The pain medication he is decrease in your colon function and causing constipation U. should not be used in suppositories or other manual manipulation to try to have a bowel movement but I would recommend MiraLax. It is a stronger stimulus than the fiber stool softener and should counteract the side effects from your Percocet you have had a anal fissure in the past and the fissure causes both pain and bleeding you have very early internal hemorrhoids that are not bleeding at this  time. Return to see [name] if you have had anal pain but try not to irritate your hemorrhoids with suppositories Tried to increase her physical activity and a food intake so you can improve your weight management. This is a hard problem with your back pain but you can improve both your back pain and hemorrhoid fissure problem by improve and your weight

## 2011-04-14 DIAGNOSIS — I10 Essential (primary) hypertension: Secondary | ICD-10-CM | POA: Insufficient documentation

## 2011-04-15 ENCOUNTER — Encounter: Payer: Self-pay | Admitting: Internal Medicine

## 2011-04-15 ENCOUNTER — Ambulatory Visit (INDEPENDENT_AMBULATORY_CARE_PROVIDER_SITE_OTHER): Payer: Medicaid Other | Admitting: Internal Medicine

## 2011-04-15 VITALS — BP 112/78 | HR 61 | Ht 71.0 in | Wt 262.0 lb

## 2011-04-15 DIAGNOSIS — I5042 Chronic combined systolic (congestive) and diastolic (congestive) heart failure: Secondary | ICD-10-CM

## 2011-04-15 DIAGNOSIS — I495 Sick sinus syndrome: Secondary | ICD-10-CM

## 2011-04-15 DIAGNOSIS — N529 Male erectile dysfunction, unspecified: Secondary | ICD-10-CM

## 2011-04-15 DIAGNOSIS — I4891 Unspecified atrial fibrillation: Secondary | ICD-10-CM

## 2011-04-15 DIAGNOSIS — F1010 Alcohol abuse, uncomplicated: Secondary | ICD-10-CM

## 2011-04-15 NOTE — Assessment & Plan Note (Addendum)
Doing very well s/p ablation His pacemaker interrogation reveals ERAF however his afib burden has much improved (now 5.4% vs >30% prior to ablation). We will continue his current medical regimen at this time.  If his afib burden remains depressed, we will stop tikosyn on return Continue pradaxa.

## 2011-04-15 NOTE — Patient Instructions (Addendum)
Your physician recommends that you schedule a follow-up appointment in: 3 months with Dr Johney Frame  Your physician has requested that you have an echocardiogram. Echocardiography is a painless test that uses sound waves to create images of your heart. It provides your doctor with information about the size and shape of your heart and how well your heart's chambers and valves are working. This procedure takes approximately one hour. There are no restrictions for this procedure.---have test prior to appointment in 3 months

## 2011-04-15 NOTE — Progress Notes (Signed)
[name] is a pleasant 50 yo AAM with a h/o paroxysmal atrial fibrillation, sinus node dysfunction s/p PPM, and chronic systolic dysfunction (nonischemic) who presents today for follow-up after his recent afib ablation.  He has done very well since his ablation.  He denies procedure related complications.  He reports occasional fatigue and dypsnea with moderate activity but feels improved in general. His concern today is with erectile dysfunction.  This is long standing.  Today, he denies symptoms of palpitations, chest pain, orthopnea, PND, lower extremity edema, dizziness, presyncope, syncope, or neurologic sequela. The patient is tolerating medications without difficulties and is otherwise without complaint today.   Past Medical History  Diagnosis Date  . Depression   . GERD (gastroesophageal reflux disease)   . HTN (hypertension)   . Obesity   . CAD (coronary artery disease)     S/P nstemi and cath 05/2008 - nonobs. dzs.  . Pulmonary embolism 04/2003    Hx of  . Atrial fibrillation   . Sinus node dysfunction     s/p PPM  . Hyperlipemia   . MI (myocardial infarction)   . Pacemaker     Medtronic  . CHF (congestive heart failure)     EF 35% by most recent echo 09/19/10  . Systolic and diastolic CHF, chronic   . PUD (peptic ulcer disease)     gastritis due to ETOH previously  . Sleep apnea     uses CPAP  . DDD (degenerative disc disease)     evaluate by neurosurgery is ongoing  . Hyperthyroidism     Graves dz on methimazole and followed by Dr Everardo All  . Pulmonary embolism 2004    previously on coumadin  . Polysubstance abuse     cocaine, last used 1999  . Pancreatitis   . Rectal bleeding   . Hemorrhoids    Past Surgical History  Procedure Date  . Pacemaker insertion 10/09    by Dr Graciela Husbands  . Fingers removed from right hand. 2/84    traumatic work injury  . Lumbar spine surgery 02/2009    Dr. Dutch Quint  . Cardiac electrophysiology mapping and ablation 12/2010    Current  Outpatient Prescriptions  Medication Sig Dispense Refill  . aspirin 81 MG tablet Take 81 mg by mouth daily.        [name] azelastine (ASTEPRO) 137 MCG/SPRAY nasal spray Place 1 spray into the nose 2 (two) times daily at 10 AM and 5 PM. Use in each nostril as directed  30 mL  5  . carvedilol (COREG) 25 MG tablet Take 25 mg by mouth 2 (two) times daily with a meal.        . dabigatran (PRADAXA) 150 MG CAPS Take 1 capsule (150 mg total) by mouth every 12 (twelve) hours.  60 capsule  6  . docusate sodium (COLACE) 100 MG capsule Take 100 mg by mouth 2 (two) times daily.        . ergocalciferol (VITAMIN D2) 50000 UNITS capsule Take 50,000 Units by mouth once a week.        . ergocalciferol (VITAMIN D2) 50000 UNITS capsule Take 50,000 Units by mouth once a week.        . gabapentin (NEURONTIN) 100 MG capsule Take 100 mg by mouth daily.        [name] KLOR-CON M20 20 MEQ tablet TAKE 1 TABLET BY MOUTH EVERY DAY  30 tablet  3  . lidocaine (XYLOCAINE) 5 % ointment Apply 1 application topically 3 (three)  times daily.        [name] losartan (COZAAR) 50 MG tablet Take 50 mg by mouth daily.        . methimazole (TAPAZOLE) 10 MG tablet TAKE 1 TABLET BY MOUTH ONCE DAILY  30 tablet  3  . mineral oil-hydrophilic petrolatum (AQUAPHOR) ointment Apply topically as needed.        [name] NITROSTAT 0.4 MG SL tablet DISSOLVE ONE TABLET UNDER THE TONGUE EVERY 5 MINUTES AS NEEDED FOR CHEST PAIN.  DO NOT EXCEED A TOTAL OF 3 DOSES IN 15 MINUTES  25 each  3  . omeprazole (PRILOSEC) 40 MG capsule Take 40 mg by mouth daily.        . Oxycodone-Acetaminophen (PERCOCET PO) Take by mouth. As needed       . polyethylene glycol (MIRALAX / GLYCOLAX) packet Take 17 g by mouth daily.        . potassium chloride SA (K-DUR,KLOR-CON) 20 MEQ tablet Take 20 mEq by mouth 2 (two) times daily.        . simvastatin (ZOCOR) 40 MG tablet Take 40 mg by mouth at bedtime.        [name] TIKOSYN 500 MCG capsule TAKE ONE CAPSULE BY MOUTH TWICE A DAY (EVERY 12 HOURS)  60 capsule   10  . Wheat Dextrin (BENEFIBER DRINK MIX PO) Take by mouth as directed.          Allergies  Allergen Reactions  . Celebrex (Celecoxib)   . Hydrocodone   . Prednisolone     History   Social History  . Marital Status: Legally Separated    Spouse Name: N/A    Number of Children: 7  . Years of Education: N/A   Occupational History  . unemployed    Social History Main Topics  . Smoking status: Former Smoker -- 1.0 packs/day for 15 years    Types: Cigarettes    Quit date: 04/04/2003  . Smokeless tobacco: Not on file  . Alcohol Use: No     formerly heavy ETOH,  now drinks a fifth of liquor every 2 weeks  . Drug Use: No     previously used crack cocaine, quit 1999,  + marijuana  . Sexually Active: Not on file   Other Topics Concern  . Not on file   Social History Narrative   Unemployed currently.  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    Family History  Problem Relation Age of Onset  . Heart disease Mother   . Breast cancer Mother   . Lung cancer Father   . Diabetes    . Colon cancer Neg Hx     ROS- All systems are reviewed and negative except as per the HPI above  Physical Exam: Filed Vitals:   04/15/11 1033  BP: 112/78  Pulse: 61  Height: 5\' 11"  (1.803 m)  Weight: 262 lb (118.842 kg)    GEN- The patient is well appearing, alert and oriented x 3 today.   Head- normocephalic, atraumatic Eyes-  Sclera clear, conjunctiva pink Ears- hearing intact Oropharynx- clear Neck- supple, no JVP Lymph- no cervical lymphadenopathy Lungs- Clear to ausculation bilaterally, normal work of breathing Heart- Regular rate and rhythm, no murmurs, rubs or gallops, PMI not laterally displaced GI- soft, NT, ND, + BS Extremities- no clubbing, cyanosis, or edema MS- no significant deformity or atrophy Skin- no rash or lesion Psych- euthymic mood, full affect Neuro- strength and sensation are intact  EKG today reveals sinus rhythm  60 bpm, Qtc 426, nonspecific  ST/T changes  Pacemaker interrogation today reviewed  Cardiac Cath  Procedure date:  06/22/2008  Findings:      IMPRESSION:   1. Scattered diffuse coronary irregularities without critical stenosis.   2. Delayed filling of the distal left anterior descending, which may be related to the patient's Cardizem.   3. Paroxysmal atrial fibrillation with intermittent conversion during the course of the procedure.   Echo 09/19/10   - Left ventricle: There is septal hypokinesis and inferior hypokinesis. Some of this may be related to the pacing function.     The EF is 35%. The cavity size was mildly dilated. Wall thickness was increased in a pattern of mild LVH.   - Right ventricle: The cavity size was mildly dilated. Systolic function was mildly reduced.   - Right atrium: The atrium was mildly dilated.   - LA size 40 mm, LVEDD 57mm  Assessment and Plan:

## 2011-04-15 NOTE — Assessment & Plan Note (Signed)
" >>  ASSESSMENT AND PLAN FOR COMBINED HEART FAILURE, CHRONIC WRITTEN ON 04/15/2011 10:56 PM BY [name], MD  Stable euvolemic on exam  We will repeat echo upon return to evaluate EF with lower afib burden "

## 2011-04-15 NOTE — Assessment & Plan Note (Signed)
Stable euvolemic on exam  We will repeat echo upon return to evaluate EF with lower afib burden

## 2011-04-15 NOTE — Assessment & Plan Note (Signed)
Normal pacemaker function See Pace Art report No changes today  

## 2011-04-15 NOTE — Assessment & Plan Note (Signed)
Stable We could consider decreasing coreg in the future He will discuss this issue with his urologist

## 2011-04-15 NOTE — Assessment & Plan Note (Signed)
He tells me that he has quit ETOH.  This is vital to treatment of his afib and cardiomyopathy long term

## 2011-05-05 ENCOUNTER — Ambulatory Visit (INDEPENDENT_AMBULATORY_CARE_PROVIDER_SITE_OTHER): Payer: Medicaid Other | Admitting: Endocrinology

## 2011-05-05 ENCOUNTER — Other Ambulatory Visit (INDEPENDENT_AMBULATORY_CARE_PROVIDER_SITE_OTHER): Payer: Medicaid Other

## 2011-05-05 ENCOUNTER — Encounter: Payer: Self-pay | Admitting: Endocrinology

## 2011-05-05 VITALS — BP 132/82 | HR 67 | Temp 98.2°F | Ht 71.0 in | Wt 263.4 lb

## 2011-05-05 DIAGNOSIS — E0590 Thyrotoxicosis, unspecified without thyrotoxic crisis or storm: Secondary | ICD-10-CM

## 2011-05-05 NOTE — Progress Notes (Signed)
Subjective:    Patient ID: [name], male    DOB: 1961/07/27, 50 y.o.   MRN: [id number]  HPI pt has h/o hyperthyroidism presumed due to grave's dz, which has been rx'ed with tapazole.  pt states he feels well in general, except for neck and right shoulder pain.  due to his heart dz, he hasn't been able to go-off tapazole for w/u and possible i-131 rx. Past Medical History  Diagnosis Date  . Depression   . GERD (gastroesophageal reflux disease)   . HTN (hypertension)   . Obesity   . CAD (coronary artery disease)     S/P nstemi and cath 05/2008 - nonobs. dzs.  . Pulmonary embolism 04/2003    Hx of  . Atrial fibrillation   . Sinus node dysfunction     s/p PPM  . Hyperlipemia   . MI (myocardial infarction)   . Pacemaker     Medtronic  . CHF (congestive heart failure)     EF 35% by most recent echo 09/19/10  . Systolic and diastolic CHF, chronic   . PUD (peptic ulcer disease)     gastritis due to ETOH previously  . Sleep apnea     uses CPAP  . DDD (degenerative disc disease)     evaluate by neurosurgery is ongoing  . Hyperthyroidism     Graves dz on methimazole and followed by Dr Everardo All  . Pulmonary embolism 2004    previously on coumadin  . Polysubstance abuse     cocaine, last used 1999  . Pancreatitis   . Rectal bleeding   . Hemorrhoids     Past Surgical History  Procedure Date  . Pacemaker insertion 10/09    by Dr Graciela Husbands  . Fingers removed from right hand. 2/84    traumatic work injury  . Lumbar spine surgery 02/2009    Dr. Dutch Quint  . Cardiac electrophysiology mapping and ablation 12/2010    History   Social History  . Marital Status: Legally Separated    Spouse Name: N/A    Number of Children: 7  . Years of Education: N/A   Occupational History  . unemployed    Social History Main Topics  . Smoking status: Former Smoker -- 1.0 packs/day for 15 years    Types: Cigarettes    Quit date: 04/04/2003  . Smokeless tobacco: Not on file  . Alcohol  Use: No     formerly heavy ETOH,  now drinks a fifth of liquor every 2 weeks  . Drug Use: No     previously used crack cocaine, quit 1999,  + marijuana  . Sexually Active: Not on file   Other Topics Concern  . Not on file   Social History Narrative   Unemployed currently.  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    Current Outpatient Prescriptions on File Prior to Visit  Medication Sig Dispense Refill  . azelastine (ASTEPRO) 137 MCG/SPRAY nasal spray Place 1 spray into the nose 2 (two) times daily at 10 AM and 5 PM. Use in each nostril as directed  30 mL  5  . carvedilol (COREG) 25 MG tablet Take 25 mg by mouth 2 (two) times daily with a meal.        . dabigatran (PRADAXA) 150 MG CAPS Take 1 capsule (150 mg total) by mouth every 12 (twelve) hours.  60 capsule  6  . docusate sodium (COLACE) 100 MG capsule Take 100 mg by mouth 2 (two) times  daily.        . ergocalciferol (VITAMIN D2) 50000 UNITS capsule Take 50,000 Units by mouth once a week.        . gabapentin (NEURONTIN) 100 MG capsule Take 100 mg by mouth daily.        [name] KLOR-CON M20 20 MEQ tablet TAKE 1 TABLET BY MOUTH EVERY DAY  30 tablet  3  . losartan (COZAAR) 50 MG tablet Take 50 mg by mouth daily.        . methimazole (TAPAZOLE) 10 MG tablet TAKE 1 TABLET BY MOUTH ONCE DAILY  30 tablet  3  . NITROSTAT 0.4 MG SL tablet DISSOLVE ONE TABLET UNDER THE TONGUE EVERY 5 MINUTES AS NEEDED FOR CHEST PAIN.  DO NOT EXCEED A TOTAL OF 3 DOSES IN 15 MINUTES  25 each  3  . omeprazole (PRILOSEC) 40 MG capsule Take 40 mg by mouth daily.        . Oxycodone-Acetaminophen (PERCOCET PO) Take by mouth. As needed       . polyethylene glycol (MIRALAX / GLYCOLAX) packet Take 17 g by mouth daily.        . simvastatin (ZOCOR) 40 MG tablet Take 40 mg by mouth at bedtime.        [name] TIKOSYN 500 MCG capsule TAKE ONE CAPSULE BY MOUTH TWICE A DAY (EVERY 12 HOURS)  60 capsule  10  . aspirin 81 MG tablet Take 81 mg by mouth daily.        [name] lidocaine  (XYLOCAINE) 5 % ointment Apply 1 application topically 3 (three) times daily.        . mineral oil-hydrophilic petrolatum (AQUAPHOR) ointment Apply topically as needed.          Allergies  Allergen Reactions  . Celebrex (Celecoxib)   . Hydrocodone   . Prednisolone     Family History  Problem Relation Age of Onset  . Heart disease Mother   . Breast cancer Mother   . Lung cancer Father   . Diabetes    . Colon cancer Neg Hx     BP 132/82  Pulse 67  Temp(Src) 98.2 F (36.8 C) (Oral)  Ht 5\' 11"  (1.803 m)  Wt 263 lb 6.4 oz (119.477 kg)  BMI 36.74 kg/m2  SpO2 95%  Review of Systems Denies fever.      Objective:   Physical Exam VITAL SIGNS:  See vs page GENERAL: no distress Neck: i think i can feel the top of a goiter, but i'm not sure.    Lab Results  Component Value Date   TSH 2.50 05/05/2011      Assessment & Plan:  Hyperthyroidism, well-controlled

## 2011-05-05 NOTE — Patient Instructions (Addendum)
blood tests are being requested for you today.  please call [phone] to hear your test results.  You will be prompted to enter the 9-digit "MRN" number that appears at the top left of this page, followed by #.  Then you will hear the message. pending the test results, please continue the same medications for now Please make a follow-up appointment in 6 months (update: i left message on phone-tree:  rx as we discussed)

## 2011-05-10 ENCOUNTER — Other Ambulatory Visit: Payer: Self-pay | Admitting: Pulmonary Disease

## 2011-05-10 DIAGNOSIS — G4733 Obstructive sleep apnea (adult) (pediatric): Secondary | ICD-10-CM

## 2011-05-18 ENCOUNTER — Other Ambulatory Visit (HOSPITAL_COMMUNITY): Payer: Medicaid Other | Admitting: Radiology

## 2011-05-18 ENCOUNTER — Ambulatory Visit: Payer: Medicaid Other | Admitting: Internal Medicine

## 2011-05-21 LAB — POCT URINALYSIS DIP (DEVICE)
Ketones, ur: NEGATIVE
Operator id: 239701
Protein, ur: NEGATIVE
Specific Gravity, Urine: 1.02

## 2011-05-27 LAB — COMPREHENSIVE METABOLIC PANEL
ALT: 31
Albumin: 3.8
Alkaline Phosphatase: 55
Potassium: 4.3
Sodium: 138
Total Protein: 6.5

## 2011-05-27 LAB — POCT CARDIAC MARKERS
Myoglobin, poc: 53.3
Myoglobin, poc: 71.6
Operator id: 280141
Operator id: 280141
Troponin i, poc: <0.05

## 2011-05-27 LAB — DIFFERENTIAL
Basophils Relative: 1
Eosinophils Absolute: 0.3
Lymphs Abs: 2.7
Monocytes Absolute: 0.4
Monocytes Relative: 8
Neutro Abs: 1.9

## 2011-05-27 LAB — CBC
Platelets: 239
RDW: 17.3 — ABNORMAL HIGH

## 2011-06-01 LAB — POCT CARDIAC MARKERS
CKMB, poc: 1.5
Myoglobin, poc: 62
Myoglobin, poc: 82.8

## 2011-06-01 LAB — CBC
HCT: 44.9
HCT: 45.2
HCT: 47.9
HCT: 48.9
Hemoglobin: 14.6
Hemoglobin: 14.6
Hemoglobin: 15.6
Hemoglobin: 15.7
MCHC: 32.3
MCV: 81.5
MCV: 81.6
MCV: 82
MCV: 82.3
Platelets: 251
Platelets: 255
Platelets: 260
RBC: 5.51
RBC: 5.91 — ABNORMAL HIGH
RDW: 16.4 — ABNORMAL HIGH
RDW: 17 — ABNORMAL HIGH
RDW: 17.3 — ABNORMAL HIGH
WBC: 5.3
WBC: 6.3
WBC: 7.5
WBC: 8.9

## 2011-06-01 LAB — COMPREHENSIVE METABOLIC PANEL
AST: 37
Albumin: 3.5
Albumin: 3.9
Alkaline Phosphatase: 47
BUN: 20
BUN: 17
CO2: 25
Chloride: 104
Chloride: 108
Creatinine, Ser: 1.13
Creatinine, Ser: 1.25
GFR calc Af Amer: >60
GFR calc non Af Amer: >60
Glucose, Bld: 99
Potassium: 4
Potassium: 4.5
Total Bilirubin: 0.7
Total Bilirubin: 0.6
Total Protein: 6.7

## 2011-06-01 LAB — RAPID URINE DRUG SCREEN, HOSP PERFORMED
Amphetamines: NOT DETECTED
Amphetamines: NOT DETECTED
Benzodiazepines: NOT DETECTED
Tetrahydrocannabinol: NOT DETECTED
Tetrahydrocannabinol: NOT DETECTED

## 2011-06-01 LAB — DIFFERENTIAL
Basophils Absolute: 0
Basophils Relative: 0
Eosinophils Relative: 3
Lymphocytes Relative: 49 — ABNORMAL HIGH
Lymphocytes Relative: 49 — ABNORMAL HIGH
Monocytes Absolute: 0.5
Monocytes Relative: 7
Neutro Abs: 1.9
Neutro Abs: 2.6
Neutrophils Relative %: 36 — ABNORMAL LOW

## 2011-06-01 LAB — TROPONIN I: Troponin I: 1.14

## 2011-06-01 LAB — BASIC METABOLIC PANEL
BUN: 12
BUN: 13
CO2: 26
Chloride: 103
Chloride: 105
Creatinine, Ser: 1.28
GFR calc Af Amer: >60
GFR calc non Af Amer: 60 — ABNORMAL LOW
GFR calc non Af Amer: >60
Glucose, Bld: 105 — ABNORMAL HIGH
Potassium: 3.5
Potassium: 3.7
Sodium: 137

## 2011-06-01 LAB — CARDIAC PANEL(CRET KIN+CKTOT+MB+TROPI)
CK, MB: 8.9 — ABNORMAL HIGH
Relative Index: 2.7 — ABNORMAL HIGH
Relative Index: 4.3 — ABNORMAL HIGH
Total CK: 298 — ABNORMAL HIGH
Troponin I: 0.93

## 2011-06-01 LAB — PROTIME-INR: INR: 1

## 2011-06-01 LAB — HEPARIN LEVEL (UNFRACTIONATED)
Heparin Unfractionated: 0.78 — ABNORMAL HIGH
Heparin Unfractionated: 0.78 — ABNORMAL HIGH

## 2011-06-01 LAB — CK TOTAL AND CKMB (NOT AT ARMC)
CK, MB: 17.5 — ABNORMAL HIGH
Relative Index: 4.8 — ABNORMAL HIGH

## 2011-06-01 LAB — APTT
aPTT: 118 — ABNORMAL HIGH
aPTT: 28

## 2011-06-01 LAB — D-DIMER, QUANTITATIVE
D-Dimer, Quant: 0.23
D-Dimer, Quant: <0.22

## 2011-06-01 LAB — TSH: TSH: 0.221 — ABNORMAL LOW

## 2011-06-15 ENCOUNTER — Encounter: Payer: Self-pay | Admitting: Pulmonary Disease

## 2011-06-15 ENCOUNTER — Ambulatory Visit (INDEPENDENT_AMBULATORY_CARE_PROVIDER_SITE_OTHER): Payer: Medicaid Other | Admitting: Pulmonary Disease

## 2011-06-15 DIAGNOSIS — J309 Allergic rhinitis, unspecified: Secondary | ICD-10-CM

## 2011-06-15 DIAGNOSIS — G4733 Obstructive sleep apnea (adult) (pediatric): Secondary | ICD-10-CM

## 2011-06-15 NOTE — Patient Instructions (Addendum)
Stop astepro Trial of beconase AQ 2 each nostril each am.  Do not sniff when taking Can try zyrtec 10mg  over the counter each am for nasal congestion. Try neilmed sinus rinses each night before putting on cpap.  Can use once during the day as well if helpful (use before taking your nasal spray) Stay on cpap, work on weight loss followup with me in one year.

## 2011-06-15 NOTE — Progress Notes (Signed)
  Subjective:    Patient ID: [name], male    DOB: June 14, 1961, 50 y.o.   MRN: [id number]  HPI The patient comes in today for followup of his sleep apnea and also allergic rhinitis.  He has been on CPAP compliantly at his optimal pressure, and reports no issues with his mask or pressure.  He feels that he is sleeping well, with adequate daytime alertness.  He has continued to have nasal congestion, as well as sinus pressure.  He is not blowing purulence from his nose.  He is using his heated humidifier on his CPAP, and does not feel that astepro has helped him.   Review of Systems  Constitutional: Negative for fever and unexpected weight change.  HENT: Positive for congestion. Negative for ear pain, nosebleeds, sore throat, rhinorrhea, sneezing, trouble swallowing, dental problem, postnasal drip and sinus pressure.   Eyes: Negative for redness and itching.  Respiratory: Negative for cough, chest tightness, shortness of breath and wheezing.   Cardiovascular: Negative for palpitations and leg swelling.  Gastrointestinal: Negative for nausea and vomiting.  Genitourinary: Negative for dysuria.  Musculoskeletal: Negative for joint swelling.  Skin: Negative for rash.  Neurological: Positive for headaches.  Hematological: Does not bruise/bleed easily.  Psychiatric/Behavioral: Negative for dysphoric mood. The patient is not nervous/anxious.        Objective:   Physical Exam Obese male in no acute distress No skin breakdown or pressure necrosis from CPAP mask Nose without purulence or discharge noted Chest clear to auscultation Lower extremities without edema, no cyanosis Alert, does not appear to be sleepy, moves all 4 extremities.       Assessment & Plan:

## 2011-06-15 NOTE — Assessment & Plan Note (Signed)
The patient is having persistent nasal congestion and dryness despite using his heated humidifier, and also astepro.  I have asked him to try sinus rinses, and we'll change his astepro to topical nasal corticosteroids as a trial.

## 2011-06-15 NOTE — Assessment & Plan Note (Signed)
The patient is doing well with CPAP, and is wearing the device compliantly.   I have asked him to work aggressively on weight loss, and to followup with me in one year.

## 2011-06-17 ENCOUNTER — Other Ambulatory Visit: Payer: Self-pay | Admitting: Physician Assistant

## 2011-06-18 ENCOUNTER — Telehealth: Payer: Self-pay | Admitting: Internal Medicine

## 2011-06-18 MED ORDER — NITROGLYCERIN 0.4 MG SL SUBL: 0.4000 mg | SUBLINGUAL_TABLET | SUBLINGUAL | Status: DC | PRN

## 2011-06-18 NOTE — Telephone Encounter (Signed)
Nitrost. Refill sent to pharmacy

## 2011-06-18 NOTE — Telephone Encounter (Signed)
Pt wants refill of nitroglycerin to [facility] on [address] please call when done

## 2011-06-19 DIAGNOSIS — N50819 Testicular pain, unspecified: Secondary | ICD-10-CM | POA: Insufficient documentation

## 2011-06-19 DIAGNOSIS — L439 Lichen planus, unspecified: Secondary | ICD-10-CM | POA: Insufficient documentation

## 2011-06-19 DIAGNOSIS — N529 Male erectile dysfunction, unspecified: Secondary | ICD-10-CM | POA: Insufficient documentation

## 2011-06-19 DIAGNOSIS — Z789 Other specified health status: Secondary | ICD-10-CM | POA: Insufficient documentation

## 2011-06-19 DIAGNOSIS — R6882 Decreased libido: Secondary | ICD-10-CM | POA: Insufficient documentation

## 2011-06-29 ENCOUNTER — Other Ambulatory Visit: Payer: Self-pay | Admitting: Internal Medicine

## 2011-07-02 ENCOUNTER — Other Ambulatory Visit: Payer: Self-pay | Admitting: Internal Medicine

## 2011-07-05 ENCOUNTER — Other Ambulatory Visit: Payer: Self-pay | Admitting: Endocrinology

## 2011-07-08 DIAGNOSIS — G4730 Sleep apnea, unspecified: Secondary | ICD-10-CM | POA: Insufficient documentation

## 2011-07-08 DIAGNOSIS — I4891 Unspecified atrial fibrillation: Secondary | ICD-10-CM | POA: Insufficient documentation

## 2011-07-15 ENCOUNTER — Encounter: Payer: Self-pay | Admitting: Internal Medicine

## 2011-07-15 ENCOUNTER — Ambulatory Visit (HOSPITAL_COMMUNITY): Payer: Medicaid Other | Attending: Cardiology | Admitting: Radiology

## 2011-07-15 ENCOUNTER — Ambulatory Visit (INDEPENDENT_AMBULATORY_CARE_PROVIDER_SITE_OTHER): Payer: Medicaid Other | Admitting: Internal Medicine

## 2011-07-15 DIAGNOSIS — I079 Rheumatic tricuspid valve disease, unspecified: Secondary | ICD-10-CM | POA: Insufficient documentation

## 2011-07-15 DIAGNOSIS — I379 Nonrheumatic pulmonary valve disorder, unspecified: Secondary | ICD-10-CM | POA: Insufficient documentation

## 2011-07-15 DIAGNOSIS — I4891 Unspecified atrial fibrillation: Secondary | ICD-10-CM | POA: Insufficient documentation

## 2011-07-15 DIAGNOSIS — I509 Heart failure, unspecified: Secondary | ICD-10-CM

## 2011-07-15 DIAGNOSIS — I5042 Chronic combined systolic (congestive) and diastolic (congestive) heart failure: Secondary | ICD-10-CM

## 2011-07-15 DIAGNOSIS — E785 Hyperlipidemia, unspecified: Secondary | ICD-10-CM | POA: Insufficient documentation

## 2011-07-15 DIAGNOSIS — I10 Essential (primary) hypertension: Secondary | ICD-10-CM | POA: Insufficient documentation

## 2011-07-15 DIAGNOSIS — Z950 Presence of cardiac pacemaker: Secondary | ICD-10-CM

## 2011-07-15 DIAGNOSIS — I252 Old myocardial infarction: Secondary | ICD-10-CM | POA: Insufficient documentation

## 2011-07-15 DIAGNOSIS — I495 Sick sinus syndrome: Secondary | ICD-10-CM

## 2011-07-15 DIAGNOSIS — I059 Rheumatic mitral valve disease, unspecified: Secondary | ICD-10-CM | POA: Insufficient documentation

## 2011-07-15 LAB — PACEMAKER DEVICE OBSERVATION
AL IMPEDENCE PM: 474 Ohm
ATRIAL PACING PM: 11
BMOD-0003RA: 30
BRDY-0002RA: 50 {beats}/min

## 2011-07-15 NOTE — Patient Instructions (Signed)
Your physician recommends that you schedule a follow-up appointment in: 3 months with Dr Graciela Husbands  Your physician wants you to follow-up in: 12 months with Dr Jacquiline Doe will receive a reminder letter in the mail two months in advance. If you don't receive a letter, please call our office to schedule the follow-up appointment.

## 2011-07-15 NOTE — Assessment & Plan Note (Signed)
" >>  ASSESSMENT AND PLAN FOR COMBINED HEART FAILURE, CHRONIC WRITTEN ON 07/15/2011  2:08 PM BY [name], MD  Echo performed today to evaluate ef s/p afib ablation. Results pending Continue current medicine regimen. "

## 2011-07-15 NOTE — Assessment & Plan Note (Signed)
Echo performed today to evaluate ef s/p afib ablation. Results pending Continue current medicine regimen.

## 2011-07-15 NOTE — Progress Notes (Signed)
[name] is a pleasant 50 yo AAM with a h/o paroxysmal atrial fibrillation, sinus node dysfunction s/p PPM, and chronic systolic dysfunction (nonischemic) who presents today for follow-up after his recent afib ablation.  He has done very well since his ablation. He is unaware of any episodes of afib.  He is very pleased with the results of the procedure.  His concern today is with erectile dysfunction.  This is long standing.  He tried androgel without improvement.  He also has chronic neck and back pain for which he plans to have surgery.  Today, he denies symptoms of palpitations, chest pain, orthopnea, PND, lower extremity edema, dizziness, presyncope, syncope, or neurologic sequela. The patient is tolerating medications without difficulties and is otherwise without complaint today.   Past Medical History  Diagnosis Date  . Depression   . GERD (gastroesophageal reflux disease)   . HTN (hypertension)   . Obesity   . CAD (coronary artery disease)     S/P nstemi and cath 05/2008 - nonobs. dzs.  . Pulmonary embolism 04/2003    Hx of  . Atrial fibrillation     s/p afib ablation  . Sinus node dysfunction     s/p MDT PPM  . Hyperlipemia   . Systolic and diastolic CHF, chronic     EF 35% by most recent echo 09/19/10  . PUD (peptic ulcer disease)     gastritis due to ETOH previously  . Sleep apnea     uses CPAP  . DDD (degenerative disc disease)     evaluate by neurosurgery is ongoing  . Hyperthyroidism     Graves dz on methimazole and followed by Dr Everardo All  . Pulmonary embolism 2004    previously on coumadin  . Polysubstance abuse     cocaine, last used 1999  . Pancreatitis   . Rectal bleeding   . Hemorrhoids   . Erectile dysfunction    Past Surgical History  Procedure Date  . Pacemaker insertion 10/09    by Dr Graciela Husbands  . Fingers removed from right hand. 2/84    traumatic work injury  . Lumbar spine surgery 02/2009    Dr. Dutch Quint  . Atrial ablation surgery 12/2010    afib  ablation by Dr Johney Frame    Current Outpatient Prescriptions  Medication Sig Dispense Refill  . azelastine (ASTEPRO) 137 MCG/SPRAY nasal spray Place 1 spray into the nose 2 (two) times daily at 10 AM and 5 PM. Use in each nostril as directed  30 mL  5  . carvedilol (COREG) 25 MG tablet Take 25 mg by mouth 2 (two) times daily with a meal.        . dabigatran (PRADAXA) 150 MG CAPS Take 1 capsule (150 mg total) by mouth every 12 (twelve) hours.  60 capsule  6  . gabapentin (NEURONTIN) 100 MG capsule Take 100 mg by mouth daily.        [name] KLOR-CON M20 20 MEQ tablet TAKE 1 TABLET BY MOUTH EVERY DAY  30 tablet  3  . losartan (COZAAR) 50 MG tablet TAKE 1 TABLET EVERY DAY  30 tablet  2  . methimazole (TAPAZOLE) 10 MG tablet TAKE 1 TABLET BY MOUTH ONCE DAILY  30 tablet  5  . nitroGLYCERIN (NITROSTAT) 0.4 MG SL tablet Place 1 tablet (0.4 mg total) under the tongue every 5 (five) minutes as needed for chest pain.  25 tablet  2  . omeprazole (PRILOSEC) 40 MG capsule Take 40 mg by mouth daily.        [name]  Oxycodone-Acetaminophen (PERCOCET PO) Take by mouth. As needed       . polyethylene glycol (MIRALAX / GLYCOLAX) packet Take 17 g by mouth daily.        . simvastatin (ZOCOR) 40 MG tablet Take 40 mg by mouth at bedtime.        [name] TIKOSYN 500 MCG capsule TAKE ONE CAPSULE BY MOUTH TWICE A DAY (EVERY 12 HOURS)  60 capsule  10  . DISCONTD: NITROSTAT 0.4 MG SL tablet DISSOLVE ONE TABLET UNDER THE TONGUE EVERY 5 MINUTES AS NEEDED FOR CHEST PAIN.  DO NOT EXCEED A TOTAL OF 3 DOSES IN 15 MINUTES  25 each  1    Allergies  Allergen Reactions  . Celebrex (Celecoxib)   . Hydrocodone   . Prednisolone     History   Social History  . Marital Status: Legally Separated    Spouse Name: N/A    Number of Children: 7  . Years of Education: N/A   Occupational History  . unemployed    Social History Main Topics  . Smoking status: Former Smoker -- 1.0 packs/day for 15 years    Types: Cigarettes    Quit date: 04/04/2003    . Smokeless tobacco: Not on file  . Alcohol Use: No     formerly heavy ETOH,  now drinks a fifth of liquor every 2 weeks  . Drug Use: No     previously used crack cocaine, quit 1999,  + marijuana  . Sexually Active: Not on file   Other Topics Concern  . Not on file   Social History Narrative   Unemployed currently.  Previously worked as a Curator.  Lives in [place].    Family History  Problem Relation Age of Onset  . Heart disease Mother   . Breast cancer Mother   . Lung cancer Father   . Diabetes    . Colon cancer Neg Hx     ROS- All systems are reviewed and negative except as per the HPI above  Physical Exam: Filed Vitals:   07/15/11 1024  BP: 118/82  Pulse: 56  Height: 5\' 11"  (1.803 m)  Weight: 267 lb (121.11 kg)    GEN- The patient is well appearing, alert and oriented x 3 today.   Head- normocephalic, atraumatic Eyes-  Sclera clear, conjunctiva pink Ears- hearing intact Oropharynx- clear Neck- supple, no JVP Lymph- no cervical lymphadenopathy Lungs- Clear to ausculation bilaterally, normal work of breathing Heart- Regular rate and rhythm, no murmurs, rubs or gallops, PMI not laterally displaced GI- soft, NT, ND, + BS Extremities- no clubbing, cyanosis, or edema MS- no significant deformity or atrophy Skin- no rash or lesion Psych- euthymic mood, full affect Neuro- strength and sensation are intact  Pacemaker interrogation today reviewed  Cardiac Cath  Procedure date:  06/22/2008  Findings:      IMPRESSION:   1. Scattered diffuse coronary irregularities without critical stenosis.   2. Delayed filling of the distal left anterior descending, which may be related to the patient's Cardizem.   3. Paroxysmal atrial fibrillation with intermittent conversion during the course of the procedure.   Echo 09/19/10   - Left ventricle: There is septal hypokinesis and inferior hypokinesis. Some of this may be related to the pacing function.     The EF is 35%.  The cavity size was mildly dilated. Wall thickness was increased in a pattern of mild LVH.   - Right ventricle: The cavity size was mildly dilated. Systolic function was mildly  reduced.   - Right atrium: The atrium was mildly dilated.   - LA size 40 mm, LVEDD 57mm  Assessment and Plan:

## 2011-07-15 NOTE — Assessment & Plan Note (Signed)
Normal pacemaker function See Pace Art report No changes today  

## 2011-07-15 NOTE — Assessment & Plan Note (Signed)
Doing very well s/p ablation His pacemaker interrogation reveals ERAF howeverthat his afib burden has much improved (now 2% vs >30% prior to ablation).    We will continue his current medical regimen at this time.  If his afib burden remains depressed, we may stop tikosyn.  I will defer this decision to Dr Graciela Husbands who knows the patient well. Continue pradaxa. The patient will resume regular EP follow-up with Dr Graciela Husbands.  I would like to see him back once in a year to check on his progress.

## 2011-07-16 ENCOUNTER — Encounter: Payer: Medicaid Other | Admitting: *Deleted

## 2011-07-16 ENCOUNTER — Telehealth: Payer: Self-pay | Admitting: Internal Medicine

## 2011-07-16 MED ORDER — VARDENAFIL HCL 10 MG PO TABS: 10.0000 mg | ORAL_TABLET | Freq: Every day | ORAL | Status: DC | PRN

## 2011-07-16 NOTE — Telephone Encounter (Signed)
Pt calling re calling in meds after echo results from yesterday , uses [facility]  [address]

## 2011-07-16 NOTE — Telephone Encounter (Signed)
Spoke with patient and let him know his echo results Also let him know I have talked with Dr Johney Frame and it is okay for him to take Levitra 10mg  as needed

## 2011-07-17 NOTE — Telephone Encounter (Signed)
Follow up   Patient returning call back to nurse- Tresa Endo.

## 2011-07-17 NOTE — Telephone Encounter (Signed)
Spoke with patient he knows that his Levitra is ready to pick up

## 2011-07-22 ENCOUNTER — Telehealth: Payer: Self-pay | Admitting: Internal Medicine

## 2011-07-22 MED ORDER — VARDENAFIL HCL 20 MG PO TABS: 20.0000 mg | ORAL_TABLET | Freq: Every day | ORAL | Status: DC | PRN

## 2011-07-22 NOTE — Telephone Encounter (Signed)
Spoke with Tresa Endo, she said that 20mg  was OK to send in due to he use to take it.  Sent in NEW Rx to [facility].  Called pt back informing him I had sent the corrected Rx to the pharmacy.

## 2011-07-22 NOTE — Telephone Encounter (Signed)
Fu call °Pt returning your call  °

## 2011-07-22 NOTE — Telephone Encounter (Signed)
Left message for pt correct script sent to pharm [facility]

## 2011-07-22 NOTE — Telephone Encounter (Signed)
Called and left message for pt to confirm dose of his LEVITRA, 10 MG was sent in to the pharmacy. Caralee Ates, CMA

## 2011-07-22 NOTE — Telephone Encounter (Signed)
New Problem:   Patient says the Dr. Jenel Lucks nurse refilled his prescription of vardenafil (LEVITRA) 10 MG tablet at [facility] on [address] [phone] wrong. Claims that she refilled it for 20mg  instead of the 10mg  and he cannot afford the medication. Would like his proper prescription to be filled.

## 2011-08-05 ENCOUNTER — Telehealth: Payer: Self-pay | Admitting: Internal Medicine

## 2011-08-05 NOTE — Telephone Encounter (Signed)
FU Call: pt calling stating that he needs letter of clearance stating pt can stop taking pradaxa for pt to have surgery on pt neck. Please return pt call to discuss further.

## 2011-08-05 NOTE — Telephone Encounter (Signed)
Dr Dutch Quint with [facility] is not scheduled yet

## 2011-08-05 NOTE — Telephone Encounter (Signed)
Called [name] back and lmom for him to call me regarding the date of his surgery and who is doing it.  Also, Dr Johney Frame not here until next week let him know that as well

## 2011-08-06 ENCOUNTER — Telehealth: Payer: Self-pay | Admitting: Internal Medicine

## 2011-08-06 NOTE — Telephone Encounter (Signed)
Spoke with Medicaid. Prior Authorization received for Tikosyn. I have spoken with Olegario Messier at the pharmacy and she is aware of this.

## 2011-08-06 NOTE — Telephone Encounter (Signed)
New message:  Pt will be out of Tikosyn tomorrow and needs pre auth thru medicaid.  Please call Medicaid at [phone]  ID number is [phone]

## 2011-08-06 NOTE — Telephone Encounter (Signed)
Patient most recently seen by Dr. Johney Frame. He is sending the patient back to Dr. Graciela Husbands for regular EP followup. Dr. Johney Frame out of town until next week. Will review with Dr. Graciela Husbands in the interim to see if he feels the patient is at acceptable cardiovascular risk for surgery on his neck and ok to hold pradaxa prior.

## 2011-08-07 NOTE — Telephone Encounter (Signed)
Based on recent echo with an EF of 45-50% and a catheterization 2009 with nonobstructive disease, his functional status is stable, he can proceed with surgery. If there are any other concerns will have him see Tereso Newcomer.thanks

## 2011-08-11 ENCOUNTER — Encounter: Payer: Self-pay | Admitting: *Deleted

## 2011-08-11 NOTE — Telephone Encounter (Signed)
I spoke with the patient. He states that he has, in the last week, had some aching down his arm. This is not worse with movement. He is uncertain if this is related to his neck pain. I have reviewed this with Dr. Graciela Husbands, he feels the patient is ok for surgery. I tried to call the patient back after review with Dr. Graciela Husbands, no answer- I left a message we would go ahead and send a clearance for him.

## 2011-08-16 ENCOUNTER — Other Ambulatory Visit: Payer: Self-pay | Admitting: Internal Medicine

## 2011-08-18 ENCOUNTER — Ambulatory Visit: Payer: Medicaid Other | Admitting: Pulmonary Disease

## 2011-08-24 ENCOUNTER — Other Ambulatory Visit: Payer: Self-pay | Admitting: Internal Medicine

## 2011-08-24 DIAGNOSIS — I4891 Unspecified atrial fibrillation: Secondary | ICD-10-CM

## 2011-08-26 ENCOUNTER — Other Ambulatory Visit: Payer: Self-pay | Admitting: Internal Medicine

## 2011-08-26 DIAGNOSIS — I4891 Unspecified atrial fibrillation: Secondary | ICD-10-CM

## 2011-08-26 MED ORDER — DABIGATRAN ETEXILATE MESYLATE 150 MG PO CAPS: 150.0000 mg | ORAL_CAPSULE | Freq: Two times a day (BID) | ORAL | Status: DC

## 2011-09-08 ENCOUNTER — Encounter: Payer: Self-pay | Admitting: Pulmonary Disease

## 2011-09-08 ENCOUNTER — Ambulatory Visit (INDEPENDENT_AMBULATORY_CARE_PROVIDER_SITE_OTHER): Payer: Medicaid Other | Admitting: Pulmonary Disease

## 2011-09-08 DIAGNOSIS — G4733 Obstructive sleep apnea (adult) (pediatric): Secondary | ICD-10-CM

## 2011-09-08 DIAGNOSIS — J309 Allergic rhinitis, unspecified: Secondary | ICD-10-CM

## 2011-09-08 NOTE — Progress Notes (Signed)
  Subjective:    Patient ID: [name], male    DOB: 01-13-1961, 51 y.o.   MRN: [id number]  HPI The patient comes in today for an acute sick visit related to his chronic rhinitis.  We have tried him on an aggressive nasal hygiene regimen with antihistamines, and Beconase nasal, as well as Astelin.  He has not seen any improvement, and in fact most recently has had severe nasal congestion that has started interfering with his CPAP use.  He is using heated humidification on his CPAP system.  His nasal congestion is bothering him significantly all during the day as well.   Review of Systems  Constitutional: Negative for fever and unexpected weight change.  HENT: Positive for congestion, sore throat and sinus pressure. Negative for ear pain, nosebleeds, rhinorrhea, sneezing, trouble swallowing, dental problem and postnasal drip.   Eyes: Negative for redness and itching.  Respiratory: Negative for cough, chest tightness, shortness of breath and wheezing.   Cardiovascular: Negative for palpitations and leg swelling.  Gastrointestinal: Negative for nausea and vomiting.  Genitourinary: Negative for dysuria.  Musculoskeletal: Negative for joint swelling.  Skin: Negative for rash.  Neurological: Positive for headaches.  Hematological: Does not bruise/bleed easily.  Psychiatric/Behavioral: Negative for dysphoric mood. The patient is not nervous/anxious.        Objective:   Physical Exam Overweight male in no acute distress Nose with very large turbinates bilaterally and significant reduction in lumen Oropharynx clear No skin breakdown or pressure necrosis from the CPAP mask Lower extremities without edema, no cyanosis noted Alert and oriented, moves all 4 extremities.  Does not appear to be overly sleepy.       Assessment & Plan:

## 2011-09-08 NOTE — Patient Instructions (Signed)
If you are having nose bleeds at times, stop beconase but can continue astelin. Can use afrin JUST AT NIGHT 2 sprays each side before putting on cpap.  And JUST UNTIL YOU SEE ENT.  Will refer you to ENT for persistent nasal congestion. followup with me in one year for sleep apnea, but call if still having issues.

## 2011-09-08 NOTE — Assessment & Plan Note (Signed)
The patient is wearing CPAP compliantly, and continues to feel that it is helping his sleep and daytime alertness.  However, his nasal congestion is now starting to make the CPAP more difficult to use.

## 2011-09-08 NOTE — Assessment & Plan Note (Signed)
The patient continues to have significant nasal congestion despite aggressive treatment for his chronic rhinitis.  His turbinates are extremely large today on exam, and at this point I think he needs an ENT evaluation for possible surgery.

## 2011-09-21 DIAGNOSIS — J309 Allergic rhinitis, unspecified: Secondary | ICD-10-CM | POA: Insufficient documentation

## 2011-09-22 ENCOUNTER — Emergency Department (HOSPITAL_COMMUNITY)
Admission: EM | Admit: 2011-09-22 | Discharge: 2011-09-23 | Disposition: A | Discharge status: Home / Self Care | Payer: Medicaid Other | Attending: Emergency Medicine | Admitting: Emergency Medicine

## 2011-09-22 ENCOUNTER — Encounter (HOSPITAL_COMMUNITY): Payer: Self-pay | Admitting: *Deleted

## 2011-09-22 DIAGNOSIS — I509 Heart failure, unspecified: Secondary | ICD-10-CM | POA: Insufficient documentation

## 2011-09-22 DIAGNOSIS — H16139 Photokeratitis, unspecified eye: Secondary | ICD-10-CM | POA: Insufficient documentation

## 2011-09-22 DIAGNOSIS — I4891 Unspecified atrial fibrillation: Secondary | ICD-10-CM | POA: Insufficient documentation

## 2011-09-22 DIAGNOSIS — S058X9A Other injuries of unspecified eye and orbit, initial encounter: Secondary | ICD-10-CM | POA: Insufficient documentation

## 2011-09-22 DIAGNOSIS — H5789 Other specified disorders of eye and adnexa: Secondary | ICD-10-CM | POA: Insufficient documentation

## 2011-09-22 DIAGNOSIS — Z79899 Other long term (current) drug therapy: Secondary | ICD-10-CM | POA: Insufficient documentation

## 2011-09-22 DIAGNOSIS — H11419 Vascular abnormalities of conjunctiva, unspecified eye: Secondary | ICD-10-CM | POA: Insufficient documentation

## 2011-09-22 DIAGNOSIS — E785 Hyperlipidemia, unspecified: Secondary | ICD-10-CM | POA: Insufficient documentation

## 2011-09-22 DIAGNOSIS — F329 Major depressive disorder, single episode, unspecified: Secondary | ICD-10-CM | POA: Insufficient documentation

## 2011-09-22 DIAGNOSIS — I10 Essential (primary) hypertension: Secondary | ICD-10-CM | POA: Insufficient documentation

## 2011-09-22 DIAGNOSIS — F3289 Other specified depressive episodes: Secondary | ICD-10-CM | POA: Insufficient documentation

## 2011-09-22 DIAGNOSIS — I2510 Atherosclerotic heart disease of native coronary artery without angina pectoris: Secondary | ICD-10-CM | POA: Insufficient documentation

## 2011-09-22 DIAGNOSIS — H5710 Ocular pain, unspecified eye: Secondary | ICD-10-CM | POA: Insufficient documentation

## 2011-09-22 DIAGNOSIS — E0590 Thyrotoxicosis, unspecified without thyrotoxic crisis or storm: Secondary | ICD-10-CM | POA: Insufficient documentation

## 2011-09-22 DIAGNOSIS — K219 Gastro-esophageal reflux disease without esophagitis: Secondary | ICD-10-CM | POA: Insufficient documentation

## 2011-09-22 DIAGNOSIS — Z86718 Personal history of other venous thrombosis and embolism: Secondary | ICD-10-CM | POA: Insufficient documentation

## 2011-09-22 DIAGNOSIS — X088XXA Exposure to other specified smoke, fire and flames, initial encounter: Secondary | ICD-10-CM | POA: Insufficient documentation

## 2011-09-22 MED ORDER — TETRACAINE HCL 0.5 % OP SOLN
2.0000 [drp] | Freq: Once | OPHTHALMIC | Status: AC
  Administered 2011-09-22: 2 [drp] via OPHTHALMIC
  Filled 2011-09-22: qty 2

## 2011-09-22 MED ORDER — FLUORESCEIN SODIUM 1 MG OP STRP
1.0000 | ORAL_STRIP | Freq: Once | OPHTHALMIC | Status: AC
  Administered 2011-09-23: 1 via OPHTHALMIC
  Filled 2011-09-22: qty 1

## 2011-09-22 NOTE — ED Notes (Signed)
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

## 2011-09-22 NOTE — ED Notes (Signed)
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 No noted foreign objects in eyes upon inspection. Pt states "it feels like dust in my eyes".

## 2011-09-23 MED ORDER — GENTAMICIN SULFATE 0.3 % OP SOLN: 1.0000 [drp] | OPHTHALMIC | Status: AC

## 2011-09-23 MED ORDER — KETOROLAC TROMETHAMINE 0.4 % OP SOLN: 1.0000 [drp] | Freq: Four times a day (QID) | OPHTHALMIC | Status: DC

## 2011-09-23 NOTE — ED Provider Notes (Signed)
History     CSN: [id number]  Arrival date & time 09/22/11  2223   First MD Initiated Contact with Patient 09/22/11 2313      Chief Complaint  Patient presents with  . 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 He went to sleep at approx 11am and woke up at 7pm with severe eye pain and redness bilaterally. He states his eyes feel "gritty". He denies having had this problem before. He denies having glaucoma, he denies having diabetes. He deneis fevers, he denies loss of vision or weakness, denies headache.  Past Medical History  Diagnosis Date  . Depression   . GERD (gastroesophageal reflux disease)   . HTN (hypertension)   . Obesity   . CAD (coronary artery disease)     S/P nstemi and cath 05/2008 - nonobs. dzs.  . Pulmonary embolism 04/2003    Hx of  . Atrial fibrillation     s/p afib ablation  . Sinus node dysfunction     s/p MDT PPM  . Hyperlipemia   . Systolic and diastolic CHF, chronic     EF 35% by most recent echo 09/19/10  . PUD (peptic ulcer disease)     gastritis due to ETOH previously  . Sleep apnea     uses CPAP  . DDD (degenerative disc disease)     evaluate by neurosurgery is ongoing  . Hyperthyroidism     Graves dz on methimazole and followed by Dr Everardo All  . Pulmonary embolism 2004    previously on coumadin  . Polysubstance abuse     cocaine, last used 1999  . Pancreatitis   . Rectal bleeding   . Hemorrhoids   . Erectile dysfunction     Past Surgical History  Procedure Date  . Pacemaker insertion 10/09    by Dr Graciela Husbands  . Fingers removed from right hand. 2/84    traumatic work injury  . Lumbar spine surgery 02/2009    Dr. Dutch Quint  . Atrial ablation surgery 12/2010    afib ablation by Dr Johney Frame    Family History  Problem Relation Age of Onset  . Heart disease Mother    . Breast cancer Mother   . Lung cancer Father   . Diabetes    . Colon cancer Neg Hx     History  Substance Use Topics  . Smoking status: Former Smoker -- 1.0 packs/day for 15 years    Types: Cigarettes    Quit date: 04/04/2003  . Smokeless tobacco: Not on file  . Alcohol Use: No     formerly heavy ETOH,  now drinks a fifth of liquor every 2 weeks      Review of Systems  All other systems reviewed and are negative.    Allergies  Celebrex; Hydrocodone; and Prednisolone  Home Medications   Current Outpatient Rx  Name Route Sig Dispense Refill  . AZELASTINE HCL 137 MCG/SPRAY NA SOLN Nasal Place 1 spray into the nose 2 (two) times daily at 10 AM and 5 PM. Use in each nostril as directed 30 mL 5  . CARVEDILOL 25 MG PO TABS Oral Take 25 mg by mouth 2 (two) times daily with a meal.      . DABIGATRAN ETEXILATE MESYLATE 150 MG PO CAPS Oral Take 1 capsule (150 mg total) by mouth every 12 (twelve) hours.  60 capsule 6  . FLUTICASONE PROPIONATE 50 MCG/ACT NA SUSP Nasal Place 2 sprays into the nose daily.    [name] KLOR-CON M20 20 MEQ PO TBCR  TAKE 1 TABLET BY MOUTH EVERY DAY 30 tablet 3  . LORATADINE 10 MG PO TABS Oral Take 10 mg by mouth daily.    [name] LOSARTAN POTASSIUM 50 MG PO TABS  TAKE 1 TABLET EVERY DAY 30 tablet 2  . METHIMAZOLE 10 MG PO TABS  TAKE 1 TABLET BY MOUTH ONCE DAILY 30 tablet 5  . NITROGLYCERIN 0.4 MG SL SUBL Sublingual Place 1 tablet (0.4 mg total) under the tongue every 5 (five) minutes as needed for chest pain. 25 tablet 2  . OMEPRAZOLE 40 MG PO CPDR Oral Take 40 mg by mouth daily.      . OXYCODONE-ACETAMINOPHEN 5-325 MG PO TABS Oral Take 1 tablet by mouth every 6 (six) hours as needed. For pain.    [name] POLYETHYLENE GLYCOL 3350 PO PACK Oral Take 17 g by mouth daily.      [name] SIMVASTATIN 40 MG PO TABS Oral Take 40 mg by mouth daily.     [name] TIKOSYN 500 MCG PO CAPS  TAKE ONE CAPSULE BY MOUTH TWICE A DAY (EVERY 12 HOURS) 60 capsule 10  . VARDENAFIL HCL 20 MG PO TABS Oral Take 1  tablet (20 mg total) by mouth daily as needed for erectile dysfunction. 20 tablet 3  . GENTAMICIN SULFATE 0.3 % OP SOLN Both Eyes Place 1 drop into both eyes every 4 (four) hours. 5 mL 0  . KETOROLAC TROMETHAMINE 0.4 % OP SOLN Both Eyes Place 1 drop into both eyes 4 (four) times daily. 5 mL 0    BP 121/84  Pulse 52  Temp(Src) 98.5 F (36.9 C) (Oral)  Resp 20  SpO2 97%  Physical Exam  Constitutional: He is oriented to person, place, and time. He appears well-developed and well-nourished.  HENT:  Head: Normocephalic and atraumatic.  Eyes: EOM and lids are normal. Pupils are equal, round, and reactive to light. No foreign bodies found. Right eye exhibits no chemosis, no discharge, no exudate and no hordeolum. No foreign body present in the right eye. Left eye exhibits no chemosis, no discharge, no exudate and no hordeolum. No foreign body present in the left eye. Right conjunctiva is injected. Right conjunctiva has no hemorrhage. Left conjunctiva is injected. Left conjunctiva has no hemorrhage. Right eye exhibits normal extraocular motion and no nystagmus. Left eye exhibits normal extraocular motion and no nystagmus.  Slit lamp exam:      The right eye shows corneal abrasion and fluorescein uptake. The right eye shows no corneal flare, no corneal ulcer, no foreign body, no hyphema and no hypopyon.       The left eye shows corneal abrasion and fluorescein uptake. The left eye shows no corneal flare, no corneal ulcer, no foreign body, no hyphema and no hypopyon.  Neck: Normal range of motion.  Cardiovascular: Normal rate and regular rhythm.   Pulmonary/Chest: Effort normal.  Musculoskeletal: Normal range of motion.  Neurological: He is alert and oriented to person, place, and time.  Skin: Skin is warm and dry.    ED Course  Procedures (including critical care time)  Labs Reviewed - No data to display No results found.   1. Ultraviolet keratitis       MDM  Tetracaine drops relieved  patients pain. Pts Visual acuity is 20/70 and the patient normally wears glasses. The patient has an  eye doctor who he has been instructed to see. NO corneal ulcerations noted on stain exam. Superficial diffuse uptake of stain noted bilaterally. Pupils ERRL and accomodation. Pt given Gentamicin and Auralgan drops.    Pt told to see his eye doctor or return to the ED if symptoms are not markedly reolved.     Dorthula Matas, PA 09/23/11 1648  Dorthula Matas, PA 09/23/11 2330

## 2011-09-24 NOTE — ED Provider Notes (Signed)
Medical screening examination/treatment/procedure(s) were performed by non-physician practitioner and as supervising physician I was immediately available for consultation/collaboration.  Donnetta Hutching, MD 09/24/11 [phone]

## 2011-10-03 ENCOUNTER — Other Ambulatory Visit: Payer: Self-pay | Admitting: Internal Medicine

## 2011-10-15 ENCOUNTER — Encounter: Payer: Medicaid Other | Admitting: *Deleted

## 2011-10-19 ENCOUNTER — Encounter: Payer: Self-pay | Admitting: *Deleted

## 2011-10-20 ENCOUNTER — Other Ambulatory Visit: Payer: Self-pay | Admitting: *Deleted

## 2011-10-20 MED ORDER — SIMVASTATIN 40 MG PO TABS: 40.0000 mg | ORAL_TABLET | Freq: Every day | ORAL | Status: DC

## 2011-10-21 ENCOUNTER — Encounter: Payer: Medicaid Other | Admitting: Internal Medicine

## 2011-10-21 ENCOUNTER — Ambulatory Visit: Payer: Medicaid Other | Admitting: Internal Medicine

## 2011-11-19 ENCOUNTER — Encounter: Payer: Self-pay | Admitting: Internal Medicine

## 2011-11-19 ENCOUNTER — Ambulatory Visit (INDEPENDENT_AMBULATORY_CARE_PROVIDER_SITE_OTHER): Payer: Medicaid Other | Admitting: *Deleted

## 2011-11-19 DIAGNOSIS — I4891 Unspecified atrial fibrillation: Secondary | ICD-10-CM

## 2011-11-19 DIAGNOSIS — I495 Sick sinus syndrome: Secondary | ICD-10-CM

## 2011-11-23 ENCOUNTER — Telehealth: Payer: Self-pay | Admitting: Internal Medicine

## 2011-11-23 LAB — REMOTE PACEMAKER DEVICE
AL IMPEDENCE PM: 474 Ohm
ATRIAL PACING PM: 12
BATTERY VOLTAGE: 2.79 V
BMOD-0003RA: 30
BMOD-0005RA: 95 {beats}/min

## 2011-11-23 NOTE — Telephone Encounter (Signed)
Will forward to Dr. Klein for review. 

## 2011-11-23 NOTE — Telephone Encounter (Signed)
New Msg: pt calling wanting to speak with nurse/MD to make sure it is ok for pt to stop taking pradaxa prior to pt neck surgery. Please return pt call to discuss further.

## 2011-11-23 NOTE — Telephone Encounter (Signed)
I don't see a reason that he can stop the medication. 36 hours before it should be plenty that he could take at nighttime dose and just skip the day before his surgery

## 2011-11-24 ENCOUNTER — Encounter (HOSPITAL_COMMUNITY): Payer: Self-pay | Admitting: Pharmacy Technician

## 2011-11-24 ENCOUNTER — Encounter: Payer: Self-pay | Admitting: Internal Medicine

## 2011-11-24 NOTE — Telephone Encounter (Signed)
I spoke with the patient. He is aware of Dr. Odessa Fleming recommendations to hold the patient's pradaxa the day before his surgery. He is aware to hold the morning of his surgery as well.

## 2011-11-24 NOTE — Telephone Encounter (Signed)
I left a message for the patient to call. 

## 2011-11-27 ENCOUNTER — Encounter (HOSPITAL_COMMUNITY): Payer: Self-pay

## 2011-11-27 ENCOUNTER — Emergency Department (HOSPITAL_COMMUNITY)
Admission: EM | Admit: 2011-11-27 | Discharge: 2011-11-27 | Disposition: A | Discharge status: Home / Self Care | Payer: Medicaid Other | Source: Home / Self Care

## 2011-11-27 DIAGNOSIS — A09 Infectious gastroenteritis and colitis, unspecified: Secondary | ICD-10-CM

## 2011-11-27 DIAGNOSIS — A084 Viral intestinal infection, unspecified: Secondary | ICD-10-CM

## 2011-11-27 NOTE — ED Notes (Signed)
Pt in clinic today with c/o body aches. Symptoms started this morning along with diarrhea. Treated body aches with aleeve with minimal relief. Pt sitting in chair resting.

## 2011-11-27 NOTE — Discharge Instructions (Signed)
Thank you for coming in today. Eat and drink normally. You should get better in a few days. Continue Tylenol or Aleve as needed for fevers chills, headaches or body aches. You have a virus causing diarrhea. Call or go to the emergency room if you get worse, have trouble breathing, have chest pains, or palpitations.

## 2011-11-27 NOTE — ED Provider Notes (Signed)
Medical screening examination/treatment/procedure(s) were performed by a resident physician and as supervising physician I was immediately available for consultation/collaboration.  Leslee Home, M.D.   Reuben Likes, MD 11/27/11 2202

## 2011-11-27 NOTE — ED Provider Notes (Signed)
[name] is a 51 y.o. male who presents to Urgent Care today for diarrhea, body aches, headache starting today. Patient is eating and drinking normally and feels well without any dizziness lightheadedness chest pain trouble breathing abdominal pain or vomiting. He denies any blood in his stool.  He has used Aleve which has helped with his headache and body ache. No fevers or chills no sick contacts. Producing urine normally.   PMH reviewed. Significant for hypertension, atrial fibrillation ROS as above otherwise neg Medications reviewed. No current facility-administered medications for this encounter.   Current Outpatient Prescriptions  Medication Sig Dispense Refill  . azelastine (ASTEPRO) 137 MCG/SPRAY nasal spray Place 1 spray into the nose 2 (two) times daily at 10 AM and 5 PM. Use in each nostril as directed  30 mL  5  . carvedilol (COREG) 25 MG tablet Take 25 mg by mouth 2 (two) times daily with a meal.        . dabigatran (PRADAXA) 150 MG CAPS Take 1 capsule (150 mg total) by mouth every 12 (twelve) hours.  60 capsule  6  . dofetilide (TIKOSYN) 500 MCG capsule Take 500 mcg by mouth 2 (two) times daily.      . fluticasone (FLONASE) 50 MCG/ACT nasal spray Place 2 sprays into the nose daily.      [name] gabapentin (NEURONTIN) 100 MG capsule Take 100 mg by mouth daily.      [name] losartan (COZAAR) 50 MG tablet Take 50 mg by mouth daily.      . methimazole (TAPAZOLE) 10 MG tablet Take 10 mg by mouth daily.      . nitroGLYCERIN (NITROSTAT) 0.4 MG SL tablet Place 0.4 mg under the tongue every 5 (five) minutes as needed. For chest pain      . omeprazole (PRILOSEC) 40 MG capsule Take 40 mg by mouth daily.        [name] oxyCODONE-acetaminophen (PERCOCET) 5-325 MG per tablet Take 1-2 tablets by mouth every 6 (six) hours as needed. For pain.      . polyethylene glycol (MIRALAX / GLYCOLAX) packet Take 17 g by mouth daily.        . potassium chloride SA (K-DUR,KLOR-CON) 20 MEQ tablet Take 20 mEq by mouth  daily.      . sildenafil (VIAGRA) 100 MG tablet Take 100 mg by mouth daily as needed. For E.D.      . simvastatin (ZOCOR) 40 MG tablet Take 40 mg by mouth daily.      [name] DISCONTD: loratadine (CLARITIN) 10 MG tablet Take 10 mg by mouth daily.      [name] DISCONTD: vardenafil (LEVITRA) 20 MG tablet Take 1 tablet (20 mg total) by mouth daily as needed for erectile dysfunction.  20 tablet  3    Exam:  BP 135/78  Pulse 65  Temp(Src) 98.6 F (37 C) (Oral)  Resp 18  SpO2 98% Gen: Well NAD HEENT: EOMI,  MMM Lungs: CTABL Nl WOB Heart: Heart rate and rhythm appeared regular by exam no MRG Abd: NABS, NT, ND Exts: Non edematous BL  LE, warm and well perfused.  Missing distal  fingers on right hand  Assessment and Plan: 50 year old male with viral gastroenteritis.  His abdominal exam is normal appearing as are his vitals.  Plan for symptom management with Tylenol, Aleve and fluids.  Plan to followup with primary care doctor on Monday if not better. Additionally discussed warning signs or symptoms and provided a handout. Patient expresses understanding.  Rodolph Bong, MD 11/27/11 2002

## 2011-12-01 ENCOUNTER — Other Ambulatory Visit (HOSPITAL_COMMUNITY): Payer: Self-pay | Admitting: *Deleted

## 2011-12-01 NOTE — Progress Notes (Signed)
Remote pacer check  

## 2011-12-02 ENCOUNTER — Encounter (HOSPITAL_COMMUNITY): Payer: Self-pay

## 2011-12-02 ENCOUNTER — Encounter (HOSPITAL_COMMUNITY)
Admission: RE | Admit: 2011-12-02 | Discharge: 2011-12-02 | Disposition: A | Discharge status: Home / Self Care | Payer: Medicaid Other | Source: Ambulatory Visit | Attending: Anesthesiology | Admitting: Anesthesiology

## 2011-12-02 ENCOUNTER — Other Ambulatory Visit: Payer: Self-pay

## 2011-12-02 ENCOUNTER — Encounter (HOSPITAL_COMMUNITY)
Admission: RE | Admit: 2011-12-02 | Discharge: 2011-12-02 | Disposition: A | Discharge status: Home / Self Care | Payer: Medicaid Other | Source: Ambulatory Visit | Attending: Neurosurgery | Admitting: Neurosurgery

## 2011-12-02 ENCOUNTER — Telehealth: Payer: Self-pay | Admitting: *Deleted

## 2011-12-02 HISTORY — DX: Acute myocardial infarction, unspecified: I21.9

## 2011-12-02 LAB — DIFFERENTIAL
Eosinophils Relative: 4 % (ref 0–5)
Lymphocytes Relative: 61 % — ABNORMAL HIGH (ref 12–46)
Lymphs Abs: 3.3 10*3/uL (ref 0.7–4.0)
Neutro Abs: 1.4 10*3/uL — ABNORMAL LOW (ref 1.7–7.7)
Neutrophils Relative %: 27 % — ABNORMAL LOW (ref 43–77)

## 2011-12-02 LAB — CBC
MCV: 78.2 fL (ref 78.0–100.0)
Platelets: 224 10*3/uL (ref 150–400)
RBC: 5.22 MIL/uL (ref 4.22–5.81)
WBC: 5.4 10*3/uL (ref 4.0–10.5)

## 2011-12-02 LAB — PROTIME-INR: Prothrombin Time: 16.1 seconds — ABNORMAL HIGH (ref 11.6–15.2)

## 2011-12-02 LAB — COMPREHENSIVE METABOLIC PANEL
AST: 22 U/L (ref 0–37)
Albumin: 3.6 g/dL (ref 3.5–5.2)
Calcium: 9 mg/dL (ref 8.4–10.5)
Creatinine, Ser: 0.95 mg/dL (ref 0.50–1.35)
Total Protein: 6.3 g/dL (ref 6.0–8.3)

## 2011-12-02 LAB — SURGICAL PCR SCREEN
MRSA, PCR: NEGATIVE
Staphylococcus aureus: NEGATIVE

## 2011-12-02 NOTE — Telephone Encounter (Signed)
Received call from [name], [name] in Short Stay regarding patient's neck surgery scheduled for 12-08-2011.  [name] received cardiac clearance in December from Dr Graciela Husbands for this.  When seen today, [name] was complaining of intermittent chest pain.  Question if further work up was needed before surgery.  I spoke with patient this afternoon.  [name] states that the pain that [name] experiences is not with exertion and typically occurs when resting about once per month.  [name] has some shooting pains in his left chest that radiate to his back and down his arm.  These terminate on their own.  [name] states that Dr Jordan Likes thinks the surgery will help to relieve these pains.    Last Celine Ahr was in June of 2011 and demonstrated an EF of 47% with global hypokinesis worse in the septum and a low risk study.  The patient did have a NSTEMI in 2009 and underwent catheterization at that time which demonstrated non obstructive disease.  [name] states that the pain [name] is feeling now is not like that pain.  [name] recently underwent afib ablation by Dr Johney Frame in 2012 and feels as though his afib burden almost completely gone since that procedure.   Will review all of this with Dr Graciela Husbands tomorrow for recommendations.

## 2011-12-02 NOTE — Progress Notes (Signed)
SPOKE WITH VANESSA AT DR POOLS OFFICE RE: PATIENTS ALLERGY TO PREDNISONE CAUSING ITCHING. VANESSA WILL CHECK AND CALL BACK.

## 2011-12-02 NOTE — Pre-Procedure Instructions (Signed)
20 JABES PRIMO  12/02/2011   Your procedure is scheduled on:  Tuesday  12/08/11   Report to [facility] Stay Center at 1000 AM.  Call this number if you have problems the morning of surgery: [phone]   Remember:   Do not eat food:After Midnight.  May have clear liquids:  UNTIL 600 AM   Clear liquids include soda, tea, black coffee, apple or grape juice, broth.  Take these medicines the morning of surgery with A SIP OF WATER:  NASAL SPRAY, COREG(CARVEDILOL), TIKOSYN, NEURONTIN, TAPAZOLE, PRILOSEC, PERCOCET, POTASSIUM    Do not wear jewelry, make-up or nail polish.  Do not wear lotions, powders, or perfumes. You may wear deodorant.  Do not shave 48 hours prior to surgery.  Do not bring valuables to the hospital.  Contacts, dentures or bridgework may not be worn into surgery.  Leave suitcase in the car. After surgery it may be brought to your room.  For patients admitted to the hospital, checkout time is 11:00 AM the day of discharge.   Patients discharged the day of surgery will not be allowed to drive home.  Name and phone number of your driver:   Special Instructions: CHG Shower Use Special Wash: 1/2 bottle night before surgery and 1/2 bottle morning of surgery.   Please read over the following fact sheets that you were given: Pain Booklet, MRSA Information and Surgical Site Infection Prevention

## 2011-12-02 NOTE — Telephone Encounter (Signed)
He should be able to have surgery at acceptable risk thanks

## 2011-12-02 NOTE — Progress Notes (Signed)
SPOKE WITH VANESSA AT DR  POOLS OFFICE AND SHE STATED PATIENT NEEDS TO ARRIVE AT 900 AM FOR 1100 AM SURGERY.

## 2011-12-02 NOTE — Consult Note (Addendum)
Anesthesia:  Patient is a 51 year old male scheduled for a one level ACDF on 12/08/11 by Dr. Jordan [name].  History includes CAD/MI 05/2008, non-ischemic CM with EF 45-50% by echo 07/2011, CHF, sinus node dysfunction s/p Medtronic PPM, afib s/p ablation, moderate OSA with CPAP use (Dr. 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 PCP is Dr. Duanne [name] on [facility] in [place].  His primary Cardiologist is Dr. Graciela [name].  He has also seen Dr. Johney [name] in the past.  Dr. Jordan [name] did contact them for clearance (see Media tab), but this was done over three months ago.  I was asked to see [name] during his PAT visit due to recent chest pain.  His last episode with yesterday. They occur on the left side, usually with rest, feel sharp, and can last up to an hour.  He reports these are not new and that they were occuring about monthly for "years", but over the last six months are occuring weekly.  Although he says they are not new, I don't see them mentioned in his most recent Cardiology notes.  His EKG today showed a-paced rhythm with non-specific IVCD, nonspecific T wave changes (inferior leads).    Echo from 07/15/11 showed: Left ventricle: The cavity size was mildly dilated. Wall thickness was normal. Systolic function was mildly reduced. The estimated ejection fraction was in the range of 45% to 50%. Diffuse hypokinesis. Doppler parameters are consistent with abnormal left ventricular relaxation (grade 1 diastolic dysfunction). Trivial TR.  He had a low risk stress test on 02/06/10 with EF 47%.  His last cath was on 06/21/08 with impression showing: 1. Scattered diffuse coronary irregularities without critical  stenosis.  2. Delayed filling of the distal left anterior descending, which may be related to the patient's Cardizem.  3. Paroxysmal atrial fibrillation with intermittent  conversion during the course of the procedure.  See full report under the Notes tab.  CXR showed no acute disease.  Labs noted.  I have contacted [name] at [name] office regarding this chest pain symptoms.  She will review this will him and update me on his recommendations.    Addendum: 12/03/11 1300  Noted message from Dr. Graciela [name].  Patient is felt at acceptable risk for surgery.

## 2011-12-02 NOTE — Progress Notes (Signed)
SPOKE WITH ALLISON ZELENAK RE PATIENT STATING HE HAD CP LEFT SIDE ON Friday AND WENT TO ED. HE WAS ALSO HAVING BODY ACHES AND DIARRHEA. PATIENT STATED THEY SAID HE HAD A VIRUS AND HE TOOK TYLENOL WHICH RELIEVED SYMPTOMS. PATIENT STATES HE IS FINE NOW, THOUGH HE WAS ENCOURAGED TO CALL DR POOLS OFFICE IF SYMPTOMS REOCCUR. ALLISON WILL CONSULT WITH PATIENT TODAY.

## 2011-12-04 ENCOUNTER — Encounter: Payer: Self-pay | Admitting: *Deleted

## 2011-12-07 ENCOUNTER — Telehealth: Payer: Self-pay | Admitting: Physician Assistant

## 2011-12-07 MED ORDER — CEFAZOLIN SODIUM-DEXTROSE 2-3 GM-% IV SOLR: 2.0000 g | INTRAVENOUS | Status: DC

## 2011-12-07 NOTE — Telephone Encounter (Signed)
Patient called this evening reporting that he is having neck surgery and was instructed to hold off on Pradaxa for today but he accidentally took it. Dr. Dutch Quint is doing the surgery tomorrow. I instructed him to contact their office for further recommendations as they would be the ones adjusting surgery time vs postponing surgery.  Janeah Kovacich PA-C

## 2011-12-08 ENCOUNTER — Ambulatory Visit (HOSPITAL_COMMUNITY): Admission: RE | Admit: 2011-12-08 | Payer: Medicaid Other | Source: Ambulatory Visit | Admitting: Neurosurgery

## 2011-12-08 ENCOUNTER — Encounter (HOSPITAL_COMMUNITY): Admission: RE | Payer: Self-pay | Source: Ambulatory Visit

## 2011-12-08 SURGERY — ANTERIOR CERVICAL DECOMPRESSION/DISCECTOMY FUSION 1 LEVEL
Anesthesia: General

## 2011-12-08 NOTE — Progress Notes (Signed)
Patient was called this am just before 0800 after talking to [facility], in Neuro OR. She stated that the Patient was cancelled due to not stopping one of his meds soon enough. She stated that Dr. Jordan Likes requested for the Patient to call his office and try to reschedule for this Friday if possible.  This information relayed to Patient. He understood and  will call the office to reschedule.

## 2011-12-09 ENCOUNTER — Other Ambulatory Visit: Payer: Self-pay

## 2011-12-09 ENCOUNTER — Encounter (HOSPITAL_COMMUNITY): Payer: Self-pay | Admitting: Pharmacy Technician

## 2011-12-09 MED ORDER — SIMVASTATIN 40 MG PO TABS: 40.0000 mg | ORAL_TABLET | Freq: Every day | ORAL | Status: DC

## 2011-12-09 NOTE — Telephone Encounter (Signed)
..   Requested Prescriptions   Signed Prescriptions Disp Refills  . simvastatin (ZOCOR) 40 MG tablet 30 tablet 11    Sig: Take 1 tablet (40 mg total) by mouth daily.    Authorizing Provider: Duke Salvia    Ordering User: Lacie Scotts   E-filled @ [facility]

## 2011-12-14 ENCOUNTER — Encounter (HOSPITAL_COMMUNITY): Payer: Self-pay | Admitting: *Deleted

## 2011-12-14 MED ORDER — CEFAZOLIN SODIUM 1-5 GM-% IV SOLN: 1.0000 g | INTRAVENOUS | Status: DC

## 2011-12-14 NOTE — Progress Notes (Addendum)
Per [name] in Dr. Lindalou Hose office disregard order for Decadron. Pt has allergy to Prednisolone.   Paged [employer] pacemaker rep Binnie Kand through [email or web address]-medtron. Notified pt scheduled for surgery @1130  tomorrow. @ 1215 received return call from [facility], communicated details of ICD orders received from Dr. Odessa Fleming office.

## 2011-12-14 NOTE — Progress Notes (Signed)
Pt callled around 1615  and asked if it is ok to take [place].  I called Revonda Standard PA for anesthesia ad she said she would not want him to take it the day of surgery.  Revonda Standard spoke with Dr Doretha Sou who said it was a question for his surgeon.  I called pt and instructed him to not take Esau Grew the day of surgery and that it is a question for his surgeon.  Pt responded, "OK".

## 2011-12-15 ENCOUNTER — Encounter (HOSPITAL_COMMUNITY): Payer: Self-pay | Admitting: Vascular Surgery

## 2011-12-15 ENCOUNTER — Ambulatory Visit (HOSPITAL_COMMUNITY): Payer: Medicaid Other | Admitting: Vascular Surgery

## 2011-12-15 ENCOUNTER — Ambulatory Visit (HOSPITAL_COMMUNITY): Payer: Medicaid Other

## 2011-12-15 ENCOUNTER — Inpatient Hospital Stay (HOSPITAL_COMMUNITY)
Admission: RE | Admit: 2011-12-15 | Discharge: 2011-12-16 | DRG: 472 | Disposition: A | Discharge status: Home / Self Care | Payer: Medicaid Other | Source: Ambulatory Visit | Attending: Neurosurgery | Admitting: Neurosurgery

## 2011-12-15 ENCOUNTER — Encounter (HOSPITAL_COMMUNITY): Payer: Self-pay | Admitting: *Deleted

## 2011-12-15 ENCOUNTER — Encounter (HOSPITAL_COMMUNITY)
Admission: RE | Disposition: A | Discharge status: Home / Self Care | Payer: Self-pay | Source: Ambulatory Visit | Attending: Neurosurgery

## 2011-12-15 DIAGNOSIS — M5020 Other cervical disc displacement, unspecified cervical region: Principal | ICD-10-CM | POA: Diagnosis present

## 2011-12-15 DIAGNOSIS — Z888 Allergy status to other drugs, medicaments and biological substances status: Secondary | ICD-10-CM

## 2011-12-15 DIAGNOSIS — Z01818 Encounter for other preprocedural examination: Secondary | ICD-10-CM

## 2011-12-15 DIAGNOSIS — I509 Heart failure, unspecified: Secondary | ICD-10-CM | POA: Diagnosis present

## 2011-12-15 DIAGNOSIS — Z87891 Personal history of nicotine dependence: Secondary | ICD-10-CM

## 2011-12-15 DIAGNOSIS — Z86711 Personal history of pulmonary embolism: Secondary | ICD-10-CM

## 2011-12-15 DIAGNOSIS — Z79899 Other long term (current) drug therapy: Secondary | ICD-10-CM

## 2011-12-15 DIAGNOSIS — K219 Gastro-esophageal reflux disease without esophagitis: Secondary | ICD-10-CM | POA: Diagnosis present

## 2011-12-15 DIAGNOSIS — F1910 Other psychoactive substance abuse, uncomplicated: Secondary | ICD-10-CM | POA: Diagnosis present

## 2011-12-15 DIAGNOSIS — Z6838 Body mass index (BMI) 38.0-38.9, adult: Secondary | ICD-10-CM

## 2011-12-15 DIAGNOSIS — I2510 Atherosclerotic heart disease of native coronary artery without angina pectoris: Secondary | ICD-10-CM | POA: Diagnosis present

## 2011-12-15 DIAGNOSIS — Z01810 Encounter for preprocedural cardiovascular examination: Secondary | ICD-10-CM

## 2011-12-15 DIAGNOSIS — I4891 Unspecified atrial fibrillation: Secondary | ICD-10-CM

## 2011-12-15 DIAGNOSIS — S68118A Complete traumatic metacarpophalangeal amputation of other finger, initial encounter: Secondary | ICD-10-CM

## 2011-12-15 DIAGNOSIS — Z950 Presence of cardiac pacemaker: Secondary | ICD-10-CM

## 2011-12-15 DIAGNOSIS — Z01812 Encounter for preprocedural laboratory examination: Secondary | ICD-10-CM

## 2011-12-15 DIAGNOSIS — Z9861 Coronary angioplasty status: Secondary | ICD-10-CM

## 2011-12-15 DIAGNOSIS — I5042 Chronic combined systolic (congestive) and diastolic (congestive) heart failure: Secondary | ICD-10-CM | POA: Diagnosis present

## 2011-12-15 DIAGNOSIS — E669 Obesity, unspecified: Secondary | ICD-10-CM | POA: Diagnosis present

## 2011-12-15 DIAGNOSIS — E785 Hyperlipidemia, unspecified: Secondary | ICD-10-CM | POA: Diagnosis present

## 2011-12-15 DIAGNOSIS — I10 Essential (primary) hypertension: Secondary | ICD-10-CM | POA: Diagnosis present

## 2011-12-15 DIAGNOSIS — I252 Old myocardial infarction: Secondary | ICD-10-CM

## 2011-12-15 HISTORY — PX: ANTERIOR CERVICAL DECOMP/DISCECTOMY FUSION: SHX1161

## 2011-12-15 LAB — PROTIME-INR
INR: 1.01 (ref 0.00–1.49)
Prothrombin Time: 13.5 seconds (ref 11.6–15.2)

## 2011-12-15 LAB — APTT: aPTT: 29 seconds (ref 24–37)

## 2011-12-15 LAB — TYPE AND SCREEN
ABO/RH(D): A POS
ABO/RH(D): A POS
Antibody Screen: NEGATIVE

## 2011-12-15 SURGERY — ANTERIOR CERVICAL DECOMPRESSION/DISCECTOMY FUSION 1 LEVEL
Anesthesia: General | Site: Spine Cervical | Wound class: Clean

## 2011-12-15 MED ORDER — ROCURONIUM BROMIDE 100 MG/10ML IV SOLN
INTRAVENOUS | Status: DC | PRN
  Administered 2011-12-15: 50 mg via INTRAVENOUS

## 2011-12-15 MED ORDER — METHIMAZOLE 10 MG PO TABS
10.0000 mg | ORAL_TABLET | Freq: Every day | ORAL | Status: DC
  Administered 2011-12-16: 10 mg via ORAL
  Filled 2011-12-15 (×2): qty 1

## 2011-12-15 MED ORDER — HYDROMORPHONE HCL PF 1 MG/ML IJ SOLN: 0.5000 mg | INTRAMUSCULAR | Status: DC | PRN

## 2011-12-15 MED ORDER — DEXAMETHASONE SODIUM PHOSPHATE 10 MG/ML IJ SOLN
INTRAMUSCULAR | Status: AC
  Filled 2011-12-15: qty 1

## 2011-12-15 MED ORDER — BACITRACIN 50000 UNITS IM SOLR
INTRAMUSCULAR | Status: AC
  Filled 2011-12-15: qty 1

## 2011-12-15 MED ORDER — OXYCODONE-ACETAMINOPHEN 5-325 MG PO TABS
1.0000 | ORAL_TABLET | ORAL | Status: DC | PRN
  Administered 2011-12-15: 2 via ORAL
  Filled 2011-12-15 (×2): qty 2

## 2011-12-15 MED ORDER — PANTOPRAZOLE SODIUM 40 MG PO TBEC
40.0000 mg | DELAYED_RELEASE_TABLET | Freq: Every day | ORAL | Status: DC
  Administered 2011-12-16: 40 mg via ORAL
  Filled 2011-12-15 (×2): qty 1

## 2011-12-15 MED ORDER — FLEET ENEMA 7-19 GM/118ML RE ENEM
1.0000 | ENEMA | Freq: Once | RECTAL | Status: AC | PRN
  Filled 2011-12-15: qty 1

## 2011-12-15 MED ORDER — FENTANYL CITRATE 0.05 MG/ML IJ SOLN
INTRAMUSCULAR | Status: DC | PRN
  Administered 2011-12-15 (×4): 50 ug via INTRAVENOUS

## 2011-12-15 MED ORDER — PROPOFOL 10 MG/ML IV EMUL
INTRAVENOUS | Status: DC | PRN
  Administered 2011-12-15: 200 mg via INTRAVENOUS

## 2011-12-15 MED ORDER — SODIUM CHLORIDE 0.9 % IV SOLN
250.0000 mL | INTRAVENOUS | Status: DC
  Administered 2011-12-15: 250 mL via INTRAVENOUS

## 2011-12-15 MED ORDER — ACETAMINOPHEN 325 MG PO TABS: 650.0000 mg | ORAL_TABLET | ORAL | Status: DC | PRN

## 2011-12-15 MED ORDER — MENTHOL 3 MG MT LOZG
1.0000 | LOZENGE | OROMUCOSAL | Status: DC | PRN
  Filled 2011-12-15: qty 9

## 2011-12-15 MED ORDER — MORPHINE SULFATE 2 MG/ML IJ SOLN: 0.0500 mg/kg | INTRAMUSCULAR | Status: DC | PRN

## 2011-12-15 MED ORDER — CEFAZOLIN SODIUM-DEXTROSE 2-3 GM-% IV SOLR
2.0000 g | INTRAVENOUS | Status: DC
  Filled 2011-12-15: qty 50

## 2011-12-15 MED ORDER — 0.9 % SODIUM CHLORIDE (POUR BTL) OPTIME
TOPICAL | Status: DC | PRN
  Administered 2011-12-15: 1000 mL

## 2011-12-15 MED ORDER — HYDROCODONE-ACETAMINOPHEN 5-325 MG PO TABS: 1.0000 | ORAL_TABLET | ORAL | Status: DC | PRN

## 2011-12-15 MED ORDER — CARVEDILOL 25 MG PO TABS
25.0000 mg | ORAL_TABLET | Freq: Two times a day (BID) | ORAL | Status: DC
  Administered 2011-12-15 – 2011-12-16 (×2): 25 mg via ORAL
  Filled 2011-12-15 (×4): qty 1

## 2011-12-15 MED ORDER — PHENOL 1.4 % MT LIQD
1.0000 | OROMUCOSAL | Status: DC | PRN
  Administered 2011-12-16: 1 via OROMUCOSAL
  Filled 2011-12-15: qty 177

## 2011-12-15 MED ORDER — ONDANSETRON HCL 4 MG/2ML IJ SOLN
INTRAMUSCULAR | Status: DC | PRN
  Administered 2011-12-15: 4 mg via INTRAVENOUS

## 2011-12-15 MED ORDER — ONDANSETRON HCL 4 MG/2ML IJ SOLN: 4.0000 mg | Freq: Once | INTRAMUSCULAR | Status: DC | PRN

## 2011-12-15 MED ORDER — THROMBIN 5000 UNITS EX KIT
PACK | CUTANEOUS | Status: DC | PRN
  Administered 2011-12-15 (×2): 5000 [IU] via TOPICAL

## 2011-12-15 MED ORDER — OXYCODONE-ACETAMINOPHEN 5-325 MG PO TABS: 1.0000 | ORAL_TABLET | Freq: Four times a day (QID) | ORAL | Status: DC | PRN

## 2011-12-15 MED ORDER — GABAPENTIN 100 MG PO CAPS
100.0000 mg | ORAL_CAPSULE | Freq: Every day | ORAL | Status: DC
  Administered 2011-12-16: 100 mg via ORAL
  Filled 2011-12-15 (×2): qty 1

## 2011-12-15 MED ORDER — SODIUM CHLORIDE 0.9 % IJ SOLN: 3.0000 mL | INTRAMUSCULAR | Status: DC | PRN

## 2011-12-15 MED ORDER — SODIUM CHLORIDE 0.9 % IV SOLN
INTRAVENOUS | Status: AC
  Filled 2011-12-15: qty 500

## 2011-12-15 MED ORDER — GLYCOPYRROLATE 0.2 MG/ML IJ SOLN
INTRAMUSCULAR | Status: DC | PRN
  Administered 2011-12-15: .8 mg via INTRAVENOUS

## 2011-12-15 MED ORDER — POLYETHYLENE GLYCOL 3350 17 G PO PACK
17.0000 g | PACK | Freq: Every day | ORAL | Status: DC
  Filled 2011-12-15 (×2): qty 1

## 2011-12-15 MED ORDER — HYDROMORPHONE HCL PF 1 MG/ML IJ SOLN
INTRAMUSCULAR | Status: AC
  Filled 2011-12-15: qty 1

## 2011-12-15 MED ORDER — LACTATED RINGERS IV SOLN
INTRAVENOUS | Status: DC | PRN
  Administered 2011-12-15 (×2): via INTRAVENOUS

## 2011-12-15 MED ORDER — ACETAMINOPHEN 650 MG RE SUPP: 650.0000 mg | RECTAL | Status: DC | PRN

## 2011-12-15 MED ORDER — NEOSTIGMINE METHYLSULFATE 1 MG/ML IJ SOLN
INTRAMUSCULAR | Status: DC | PRN
  Administered 2011-12-15: 4 mg via INTRAVENOUS

## 2011-12-15 MED ORDER — FLUTICASONE PROPIONATE 50 MCG/ACT NA SUSP
2.0000 | Freq: Every day | NASAL | Status: DC
  Filled 2011-12-15: qty 16

## 2011-12-15 MED ORDER — DOFETILIDE 500 MCG PO CAPS
500.0000 ug | ORAL_CAPSULE | Freq: Two times a day (BID) | ORAL | Status: DC
  Administered 2011-12-15 – 2011-12-16 (×2): 500 ug via ORAL
  Filled 2011-12-15 (×4): qty 1

## 2011-12-15 MED ORDER — SODIUM CHLORIDE 0.9 % IJ SOLN
3.0000 mL | Freq: Two times a day (BID) | INTRAMUSCULAR | Status: DC
  Administered 2011-12-16 (×2): 3 mL via INTRAVENOUS

## 2011-12-15 MED ORDER — ALUM & MAG HYDROXIDE-SIMETH 200-200-20 MG/5ML PO SUSP
30.0000 mL | Freq: Four times a day (QID) | ORAL | Status: DC | PRN
Start: 2011-12-15 — End: 2011-12-16

## 2011-12-15 MED ORDER — BISACODYL 10 MG RE SUPP: 10.0000 mg | Freq: Every day | RECTAL | Status: DC | PRN

## 2011-12-15 MED ORDER — POTASSIUM CHLORIDE CRYS ER 20 MEQ PO TBCR
20.0000 meq | EXTENDED_RELEASE_TABLET | Freq: Every day | ORAL | Status: DC
  Administered 2011-12-16: 20 meq via ORAL
  Filled 2011-12-15 (×2): qty 1

## 2011-12-15 MED ORDER — SODIUM CHLORIDE 0.9 % IR SOLN
Status: DC | PRN
  Administered 2011-12-15: 14:00:00

## 2011-12-15 MED ORDER — HEMOSTATIC AGENTS (NO CHARGE) OPTIME
TOPICAL | Status: DC | PRN
  Administered 2011-12-15: 1 via TOPICAL

## 2011-12-15 MED ORDER — HYDROMORPHONE HCL PF 1 MG/ML IJ SOLN
0.2500 mg | INTRAMUSCULAR | Status: DC | PRN
  Administered 2011-12-15 (×4): 0.5 mg via INTRAVENOUS

## 2011-12-15 MED ORDER — SENNA 8.6 MG PO TABS
1.0000 | ORAL_TABLET | Freq: Two times a day (BID) | ORAL | Status: DC
  Filled 2011-12-15 (×2): qty 1

## 2011-12-15 MED ORDER — ONDANSETRON HCL 4 MG/2ML IJ SOLN: 4.0000 mg | INTRAMUSCULAR | Status: DC | PRN

## 2011-12-15 MED ORDER — MIDAZOLAM HCL 5 MG/5ML IJ SOLN
INTRAMUSCULAR | Status: DC | PRN
  Administered 2011-12-15: 2 mg via INTRAVENOUS

## 2011-12-15 MED ORDER — HYDROMORPHONE HCL PF 1 MG/ML IJ SOLN
0.2500 mg | INTRAMUSCULAR | Status: DC | PRN
Start: 2011-12-15 — End: 2011-12-15

## 2011-12-15 MED ORDER — CEFAZOLIN SODIUM 1-5 GM-% IV SOLN
INTRAVENOUS | Status: DC | PRN
  Administered 2011-12-15: 2 g via INTRAVENOUS

## 2011-12-15 MED ORDER — CEFAZOLIN SODIUM 1-5 GM-% IV SOLN
1.0000 g | Freq: Three times a day (TID) | INTRAVENOUS | Status: AC
  Administered 2011-12-15 – 2011-12-16 (×2): 1 g via INTRAVENOUS
  Filled 2011-12-15 (×2): qty 50

## 2011-12-15 MED ORDER — POLYETHYLENE GLYCOL 3350 17 G PO PACK
17.0000 g | PACK | Freq: Every day | ORAL | Status: DC | PRN
  Filled 2011-12-15: qty 1

## 2011-12-15 MED ORDER — LOSARTAN POTASSIUM 50 MG PO TABS
50.0000 mg | ORAL_TABLET | Freq: Every day | ORAL | Status: DC
  Administered 2011-12-16: 50 mg via ORAL
  Filled 2011-12-15 (×2): qty 1

## 2011-12-15 MED ORDER — NITROGLYCERIN 0.4 MG SL SUBL: 0.4000 mg | SUBLINGUAL_TABLET | SUBLINGUAL | Status: DC | PRN

## 2011-12-15 MED ORDER — CYCLOBENZAPRINE HCL 10 MG PO TABS: 10.0000 mg | ORAL_TABLET | Freq: Three times a day (TID) | ORAL | Status: DC | PRN

## 2011-12-15 MED ORDER — AZELASTINE HCL 0.1 % NA SOLN
1.0000 | Freq: Two times a day (BID) | NASAL | Status: DC
  Filled 2011-12-15: qty 30

## 2011-12-15 MED ORDER — DEXAMETHASONE SODIUM PHOSPHATE 10 MG/ML IJ SOLN
10.0000 mg | Freq: Once | INTRAMUSCULAR | Status: AC
  Administered 2011-12-15: 10 mg via INTRAVENOUS

## 2011-12-15 MED ORDER — LIDOCAINE HCL (CARDIAC) 20 MG/ML IV SOLN
INTRAVENOUS | Status: DC | PRN
  Administered 2011-12-15: 100 mg via INTRAVENOUS

## 2011-12-15 SURGICAL SUPPLY — 57 items
ADH SKN CLS APL DERMABOND .7 (GAUZE/BANDAGES/DRESSINGS) ×1
APL SKNCLS STERI-STRIP NONHPOA (GAUZE/BANDAGES/DRESSINGS) ×1
BAG DECANTER FOR FLEXI CONT (MISCELLANEOUS) ×2 IMPLANT
BENZOIN TINCTURE PRP APPL 2/3 (GAUZE/BANDAGES/DRESSINGS) ×2 IMPLANT
BRUSH SCRUB EZ PLAIN DRY (MISCELLANEOUS) ×2 IMPLANT
BUR MATCHSTICK NEURO 3.0 LAGG (BURR) ×2 IMPLANT
CANISTER SUCTION 2500CC (MISCELLANEOUS) ×2 IMPLANT
CLOTH BEACON ORANGE TIMEOUT ST (SAFETY) ×2 IMPLANT
CONT SPEC 4OZ CLIKSEAL STRL BL (MISCELLANEOUS) ×2 IMPLANT
DERMABOND ADVANCED (GAUZE/BANDAGES/DRESSINGS) ×1
DERMABOND ADVANCED .7 DNX12 (GAUZE/BANDAGES/DRESSINGS) IMPLANT
DRAPE C-ARM 42X72 X-RAY (DRAPES) ×4 IMPLANT
DRAPE LAPAROTOMY 100X72 PEDS (DRAPES) ×2 IMPLANT
DRAPE MICROSCOPE ZEISS OPMI (DRAPES) ×2 IMPLANT
DRAPE POUCH INSTRU U-SHP 10X18 (DRAPES) ×2 IMPLANT
DRILL BIT (BIT) ×1 IMPLANT
ELECT COATED BLADE 2.86 ST (ELECTRODE) ×2 IMPLANT
ELECT REM PT RETURN 9FT ADLT (ELECTROSURGICAL) ×2
ELECTRODE REM PT RTRN 9FT ADLT (ELECTROSURGICAL) ×1 IMPLANT
GAUZE SPONGE 4X4 16PLY XRAY LF (GAUZE/BANDAGES/DRESSINGS) IMPLANT
GLOVE BIOGEL PI IND STRL 7.0 (GLOVE) IMPLANT
GLOVE BIOGEL PI INDICATOR 7.0 (GLOVE) ×1
GLOVE ECLIPSE 7.5 STRL STRAW (GLOVE) ×1 IMPLANT
GLOVE ECLIPSE 8.5 STRL (GLOVE) ×2 IMPLANT
GLOVE EXAM NITRILE LRG STRL (GLOVE) IMPLANT
GLOVE EXAM NITRILE MD LF STRL (GLOVE) ×1 IMPLANT
GLOVE EXAM NITRILE XL STR (GLOVE) IMPLANT
GLOVE EXAM NITRILE XS STR PU (GLOVE) IMPLANT
GLOVE OPTIFIT SS 6.5 STRL BRWN (GLOVE) ×2 IMPLANT
GOWN BRE IMP SLV AUR LG STRL (GOWN DISPOSABLE) ×1 IMPLANT
GOWN BRE IMP SLV AUR XL STRL (GOWN DISPOSABLE) ×2 IMPLANT
GOWN STRL REIN 2XL LVL4 (GOWN DISPOSABLE) IMPLANT
HEAD HALTER (SOFTGOODS) ×2 IMPLANT
KIT BASIN OR (CUSTOM PROCEDURE TRAY) ×2 IMPLANT
KIT ROOM TURNOVER OR (KITS) ×2 IMPLANT
NDL SPNL 20GX3.5 QUINCKE YW (NEEDLE) ×1 IMPLANT
NEEDLE SPNL 20GX3.5 QUINCKE YW (NEEDLE) ×2 IMPLANT
NS IRRIG 1000ML POUR BTL (IV SOLUTION) ×2 IMPLANT
PACK LAMINECTOMY NEURO (CUSTOM PROCEDURE TRAY) ×2 IMPLANT
PAD ARMBOARD 7.5X6 YLW CONV (MISCELLANEOUS) ×6 IMPLANT
PLATE ELITE VISION 25MM (Plate) ×1 IMPLANT
RUBBERBAND STERILE (MISCELLANEOUS) ×4 IMPLANT
SCREW ST 13X4XST VA NS SPNE (Screw) IMPLANT
SCREW ST VAR 4 ATL (Screw) ×8 IMPLANT
SPACER BONE CORNERSTONE 7X14 (Orthopedic Implant) ×1 IMPLANT
SPONGE GAUZE 4X4 12PLY (GAUZE/BANDAGES/DRESSINGS) ×2 IMPLANT
SPONGE INTESTINAL PEANUT (DISPOSABLE) ×2 IMPLANT
SPONGE SURGIFOAM ABS GEL SZ50 (HEMOSTASIS) ×2 IMPLANT
STRIP CLOSURE SKIN 1/2X4 (GAUZE/BANDAGES/DRESSINGS) ×2 IMPLANT
SUT PDS AB 5-0 P3 18 (SUTURE) ×2 IMPLANT
SUT VIC AB 3-0 SH 8-18 (SUTURE) ×2 IMPLANT
SYR 20ML ECCENTRIC (SYRINGE) ×2 IMPLANT
TAPE CLOTH 4X10 WHT NS (GAUZE/BANDAGES/DRESSINGS) ×2 IMPLANT
TAPE CLOTH SURG 4X10 WHT LF (GAUZE/BANDAGES/DRESSINGS) ×1 IMPLANT
TOWEL OR 17X24 6PK STRL BLUE (TOWEL DISPOSABLE) ×2 IMPLANT
TOWEL OR 17X26 10 PK STRL BLUE (TOWEL DISPOSABLE) ×2 IMPLANT
WATER STERILE IRR 1000ML POUR (IV SOLUTION) ×2 IMPLANT

## 2011-12-15 NOTE — Progress Notes (Signed)
Shonna Chock PA okay with PT, PTT but since elevated called into OR to let MD know and he gave order over telephone to redo those labs today. Order entered and lab was drawn.

## 2011-12-15 NOTE — Brief Op Note (Signed)
12/15/2011  2:35 PM  PATIENT:  [name]  51 y.o. male  PRE-OPERATIVE DIAGNOSIS:  Herniated Nucleus Pulposus/stenosis  POST-OPERATIVE DIAGNOSIS:  Herniated Nucleus Pulposus/stenosis  PROCEDURE:  Procedure(s) (LRB): ANTERIOR CERVICAL DECOMPRESSION/DISCECTOMY FUSION 1 LEVEL (N/A)  SURGEON:  Surgeon(s) and Role:    * Temple Pacini, MD - Primary    * Reinaldo Meeker, MD - Assisting  PHYSICIAN ASSISTANT:   ASSISTANTS: Kritzer   ANESTHESIA:   general  EBL:  Total I/O In: 1000 [I.V.:1000] Out: -   BLOOD ADMINISTERED:none  DRAINS: none   LOCAL MEDICATIONS USED:  NONE  SPECIMEN:  No Specimen  DISPOSITION OF SPECIMEN:  N/A  COUNTS:  YES  TOURNIQUET:  * No tourniquets in log *  DICTATION: .Dragon Dictation  PLAN OF CARE: Admit to inpatient   PATIENT DISPOSITION:  PACU - hemodynamically stable.   Delay start of Pharmacological VTE agent (>24hrs) due to surgical blood loss or risk of bleeding: yes

## 2011-12-15 NOTE — Progress Notes (Signed)
SPoke with DR. Pool re: pts reaction to prednisone, stated to give Decadron IV anyway for rt arm pain post op.

## 2011-12-15 NOTE — Anesthesia Procedure Notes (Signed)
Procedure Name: Intubation Date/Time: 12/15/2011 1:28 PM Performed by: Rogelia Boga Pre-anesthesia Checklist: Patient identified, Emergency Drugs available, Suction available, Patient being monitored and Timeout performed Patient Re-evaluated:Patient Re-evaluated prior to inductionOxygen Delivery Method: Circle system utilized Preoxygenation: Pre-oxygenation with 100% oxygen Intubation Type: IV induction Ventilation: Mask ventilation without difficulty and Oral airway inserted - appropriate to patient size Laryngoscope Size: Mac and 4 Grade View: Grade I Tube type: Oral Tube size: 7.5 mm Number of attempts: 1 Airway Equipment and Method: Stylet Placement Confirmation: ETT inserted through vocal cords under direct vision,  positive ETCO2 and breath sounds checked- equal and bilateral Secured at: 21 cm Tube secured with: Tape Dental Injury: Teeth and Oropharynx as per pre-operative assessment

## 2011-12-15 NOTE — Anesthesia Preprocedure Evaluation (Addendum)
Anesthesia Evaluation  Patient identified by MRN, date of birth, ID band Patient awake    Reviewed: Allergy & Precautions, H&P , NPO status , Patient's Chart, lab work & pertinent test results, reviewed documented beta blocker date and time   History of Anesthesia Complications Negative for: history of anesthetic complications  Airway Mallampati: I TM Distance: >3 FB     Dental  (+) Partial Upper and Dental Advisory Given   Pulmonary shortness of breath, with exertion and at rest, sleep apnea and Continuous Positive Airway Pressure Ventilation , former smoker (1 ppd x 15 years; quit 2004) breath sounds clear to auscultation        Cardiovascular hypertension, Pt. on medications and Pt. on home beta blockers + angina + CAD and + Past MI + dysrhythmias (ablation 2012) Atrial Fibrillation + pacemaker (august 2009; placed for low HR) Rhythm:Regular Rate:Normal     Neuro/Psych Depression    GI/Hepatic PUD, GERD-  Medicated and Controlled,  Endo/Other  Hyperthyroidism   Renal/GU      Musculoskeletal   Abdominal   Peds  Hematology   Anesthesia Other Findings   Reproductive/Obstetrics                          Anesthesia Physical Anesthesia Plan  ASA: III  Anesthesia Plan: General   Post-op Pain Management:    Induction: Intravenous  Airway Management Planned: Oral ETT  Additional Equipment:   Intra-op Plan:   Post-operative Plan: Extubation in OR  Informed Consent: I have reviewed the patients History and Physical, chart, labs and discussed the procedure including the risks, benefits and alternatives for the proposed anesthesia with the patient or authorized representative who has indicated his/her understanding and acceptance.   Dental advisory given  Plan Discussed with: CRNA, Anesthesiologist and Surgeon  Anesthesia Plan Comments:         Anesthesia Quick Evaluation

## 2011-12-15 NOTE — Transfer of Care (Signed)
Immediate Anesthesia Transfer of Care Note  Patient: [name]  Procedure(s) Performed: Procedure(s) (LRB): ANTERIOR CERVICAL DECOMPRESSION/DISCECTOMY FUSION 1 LEVEL (N/A)  Patient Location: PACU  Anesthesia Type: General  Level of Consciousness: awake, alert , oriented and patient cooperative  Airway & Oxygen Therapy: Patient Spontanous Breathing and Patient connected to nasal cannula oxygen  Post-op Assessment: Report given to PACU RN, Post -op Vital signs reviewed and stable and Patient moving all extremities X 4  Post vital signs: Reviewed and stable  Complications: No apparent anesthesia complications

## 2011-12-15 NOTE — Preoperative (Signed)
Beta Blockers   Reason not to administer Beta Blockers:Not Applicable 

## 2011-12-15 NOTE — Anesthesia Postprocedure Evaluation (Signed)
  Anesthesia Post-op Note  Patient: [name]  Procedure(s) Performed: Procedure(s) (LRB): ANTERIOR CERVICAL DECOMPRESSION/DISCECTOMY FUSION 1 LEVEL (N/A)  Patient Location: PACU  Anesthesia Type: General  Level of Consciousness: awake, alert  and oriented  Airway and Oxygen Therapy: Patient Spontanous Breathing and Patient connected to nasal cannula oxygen  Post-op Pain: mild  Post-op Assessment: Post-op Vital signs reviewed  Post-op Vital Signs: Reviewed and stable  Complications: No apparent anesthesia complications

## 2011-12-15 NOTE — Op Note (Signed)
Date of procedure: 12/15/2011  Date of dictation: Same  Service: Neurosurgery  Preoperative diagnosis: Left paracentral C6-7 herniated nucleus pulposus  Postoperative diagnosis: Same  Procedure Name: C6-7 anterior cervical discectomy and fusion with allograft and anterior plating.  Surgeon:Aleana Fifita A.Cary Wilford, M.D.  Asst. Surgeon: Gerlene Fee  Anesthesia: General  Indication: A 51 year old male with persistent neck and left upper trimming a pain paresthesias and weakness. Workup demonstrates evidence of left C6-7 paracentral disc herniation with spinal cord compression. Patient presents now for C6-7 anterior cervical discectomy and fusion with anterior plating in hopes of improving his symptoms.  Operative note: After induction of anesthesia, patient positioned supine with Extended and held place with halter traction. Patient's anterior cervical region prepped and draped sterilely. 10 blade used to make a linear skin incision overlying the C6-7 interspace. This is carried down sharply to the platysma. Is then divided vertically and dissection proceeded on the medial border of the sternocleidomastoid muscle and carotid sheath. Trachea and esophagus were mobilized track towards the left. Prevertebral fascia showing her spinal column. Longus colli muscle and elevated bilaterally somewhat hard. Deep self retaining retractors placed intraoperative fluoroscopy space used levels were confirmed. The space and size 15 blade in a rectangular fashion. Y. disc space cleanout was achieved using pituitary rongeurs forward and backward angle Carlen Carlen curettes Kerrison or his high-speed drill prominence the disc removed down to the posterior annulus. Microscope brought field these at the remainder of the discectomy. Remaining aspects of annulus nonstress removing high-speed drill down 12 posterior lateral and posterior lateral is an elevated and resected the fashion using Kerrison rongeurs. Underlying thecal sac and.  Y3 to person former and the bodies of C7. Decompression MCH are afraid are wide anterior foraminotomies were then performed on the exiting C7 nerve roots bilaterally. This went very 30 judgment she appears of injury thecal sac and nerve roots. Wound is then irrigated out solution. Of with postoperative hemostasis of the head. Microscope system were removed. 7 mm cornerstone allograft wedges and packed in place and recessed roughly 1 mm from the cortical margin. 25 mg by centimeters with the posterior the C 6 and C7 levels. Final images revealed good position the varus or prior probable neuroma spine. Wound is then irrigated out solution. Gelfoam was placed off for hemostasis. Hemostasis was achieved with bipolar chart was and close in layers. Steri-Strips triggers were applied. There were no apparent outpatient. Patient will and he returns recovery postop.

## 2011-12-15 NOTE — H&P (Signed)
[name] is an 51 y.o. male.   Chief Complaint: Neck pain and upper extremity pain and numbness. HPI: 51 year old male with neck and left upper extremity pain paresthesias and weakness consistent with a cervical radiculopathy with some elements of the early cervical myelopathy as well. Patient's failed conservative measure workup demonstrates evidence of what appears to be a central disc herniation at C6-7 with spinal cord compression. Patient presents now for C6-7 anterior cervical discectomy and fusion in hopes of improving his symptoms.  Past Medical History  Diagnosis Date  . GERD (gastroesophageal reflux disease)   . HTN (hypertension)   . Obesity   . CAD (coronary artery disease)     S/P nstemi and cath 05/2008 - nonobs. dzs.  . Pulmonary embolism 04/2003    Hx of  . Atrial fibrillation     s/p afib ablation  . Sinus node dysfunction     s/p MDT PPM  . Hyperlipemia   . Systolic and diastolic CHF, chronic     EF 35% by most recent echo 09/19/10  . PUD (peptic ulcer disease)     gastritis due to ETOH previously  . Hyperthyroidism     Graves dz on methimazole and followed by Dr Everardo All  . Pulmonary embolism 2004    previously on coumadin  . Polysubstance abuse     cocaine, last used 1999  . Pancreatitis   . Rectal bleeding   . Hemorrhoids   . Myocardial infarction     DR Graciela Husbands   . Pacemaker     MEDTRONIC  . Angina     LEFT CHEST PAIN FRIDAY  CAME TO ED   . Shortness of breath     LYING DOWN AT TIMES  . Sleep apnea     uses CPAP [facility] 2 YRS  . DDD (degenerative disc disease)     evaluate by neurosurgery is ongoing  . Depression   . Erectile dysfunction     Past Surgical History  Procedure Date  . Pacemaker insertion 10/09    by Dr Graciela Husbands  . Fingers removed from right hand. 2/84    traumatic work injury  . Lumbar spine surgery 02/2009    Dr. Dutch Quint  . Atrial ablation surgery 12/2010    afib ablation by Dr Johney Frame  . Insert / replace / remove  pacemaker     05/2008    . Cardiac catheterization     CADIAC ABLATION DR Johney Frame 12/2010    Family History  Problem Relation Age of Onset  . Heart disease Mother   . Breast cancer Mother   . Lung cancer Father   . Diabetes    . Colon cancer Neg Hx    Social History:  reports that he quit smoking about 8 years ago. His smoking use included Cigarettes. He has a 15 pack-year smoking history. He does not have any smokeless tobacco history on file. He reports that he does not drink alcohol or use illicit drugs.  Allergies:  Allergies  Allergen Reactions  . Celebrex (Celecoxib) Itching  . Hydrocodone Itching  . Prednisolone Itching    Medications Prior to Admission  Medication Dose Route Frequency Provider Last Rate Last Dose  . ceFAZolin (ANCEF) IVPB 2 g/50 mL premix  2 g Intravenous 30 min Pre-Op Temple Pacini, MD      . HYDROmorphone (DILAUDID) injection 0.25-0.5 mg  0.25-0.5 mg Intravenous Q5 min PRN Kerby Nora, MD      . HYDROmorphone (DILAUDID)  injection 0.25-0.5 mg  0.25-0.5 mg Intravenous Q5 min PRN Kerby Nora, MD      . morphine 2 MG/ML injection 6.316 mg  0.05 mg/kg Intravenous Q10 min PRN Kerby Nora, MD      . ondansetron [facility]) injection 4 mg  4 mg Intravenous Once PRN Kerby Nora, MD      . ondansetron [facility]) injection 4 mg  4 mg Intravenous Once PRN Kerby Nora, MD      . DISCONTD: ceFAZolin (ANCEF) IVPB 1 g/50 mL premix  1 g Intravenous 30 min Pre-Op Temple Pacini, MD      . DISCONTD: ceFAZolin (ANCEF) IVPB 2 g/50 mL premix  2 g Intravenous 30 min Pre-Op Temple Pacini, MD      . DISCONTD: morphine 2 MG/ML injection 0.05 mg/kg  0.05 mg/kg Intravenous Q10 min PRN Kerby Nora, MD       Medications Prior to Admission  Medication Sig Dispense Refill  . azelastine (ASTEPRO) 137 MCG/SPRAY nasal spray Place 1 spray into the nose 2 (two) times daily at 10 AM and 5 PM. Use in each nostril as directed  30 mL  5  . carvedilol (COREG) 25 MG tablet Take 25 mg by mouth  2 (two) times daily with a meal.       . dofetilide (TIKOSYN) 500 MCG capsule Take 500 mcg by mouth 2 (two) times daily.      . fluticasone (FLONASE) 50 MCG/ACT nasal spray Place 2 sprays into the nose daily.      [name] gabapentin (NEURONTIN) 100 MG capsule Take 100 mg by mouth daily.      [name] losartan (COZAAR) 50 MG tablet Take 50 mg by mouth daily.      . methimazole (TAPAZOLE) 10 MG tablet Take 10 mg by mouth daily.      . nitroGLYCERIN (NITROSTAT) 0.4 MG SL tablet Place 0.4 mg under the tongue every 5 (five) minutes as needed. For chest pain      . omeprazole (PRILOSEC) 40 MG capsule Take 40 mg by mouth daily.       [name] oxyCODONE-acetaminophen (PERCOCET) 5-325 MG per tablet Take 1-2 tablets by mouth every 6 (six) hours as needed. For pain.      . polyethylene glycol (MIRALAX / GLYCOLAX) packet Take 17 g by mouth daily.        . potassium chloride SA (K-DUR,KLOR-CON) 20 MEQ tablet Take 20 mEq by mouth daily.      . sildenafil (VIAGRA) 100 MG tablet Take 100 mg by mouth daily as needed. For erectile dysfunction      . dabigatran (PRADAXA) 150 MG CAPS Take 1 capsule (150 mg total) by mouth every 12 (twelve) hours.  60 capsule  6    Results for orders placed during the hospital encounter of 12/15/11 (from the past 48 hour(s))  PROTIME-INR     Status: Normal   Collection Time   12/15/11  9:59 AM      Component Value Range Comment   Prothrombin Time 13.5  11.6 - 15.2 (seconds)    INR 1.01  0.00 - 1.49    APTT     Status: Normal   Collection Time   12/15/11  9:59 AM      Component Value Range Comment   aPTT 29  24 - 37 (seconds)   TYPE AND SCREEN     Status: Normal   Collection Time   12/15/11 10:38 AM  Component Value Range Comment   ABO/RH(D) A POS      Antibody Screen NEG      Sample Expiration 12/18/2011      No results found.  Review of Systems  Constitutional: Negative.   HENT: Negative.   Eyes: Negative.   Respiratory: Negative.   Cardiovascular: Negative.   Gastrointestinal:  Negative.   Genitourinary: Negative.   Musculoskeletal: Negative.   Skin: Negative.   Neurological: Negative.   Endo/Heme/Allergies: Negative.   Psychiatric/Behavioral: Negative.     Blood pressure 118/75, pulse 58, temperature 97.3 F (36.3 C), temperature source Oral, resp. rate 18, height 5' 10.87" (1.8 m), weight 126.3 kg (278 lb 7.1 oz), SpO2 94.00%. Physical Exam  Constitutional: He is oriented to person, place, and time. He appears well-developed and well-nourished.  HENT:  Head: Normocephalic and atraumatic.  Right Ear: External ear normal.  Left Ear: External ear normal.  Nose: Nose normal.  Mouth/Throat: Oropharynx is clear and moist.  Eyes: Conjunctivae and EOM are normal. Pupils are equal, round, and reactive to light.  Neck: Normal range of motion. Neck supple. No tracheal deviation present. No thyromegaly present.  Cardiovascular: Normal rate, regular rhythm, normal heart sounds and intact distal pulses.   Respiratory: Effort normal and breath sounds normal. No respiratory distress. He has no wheezes.  GI: Soft. Bowel sounds are normal. He exhibits no distension. There is no tenderness.  Musculoskeletal: Normal range of motion. He exhibits no edema and no tenderness.  Neurological: He is alert and oriented to person, place, and time. He has normal reflexes. He displays normal reflexes. No cranial nerve deficit. He exhibits normal muscle tone. Coordination normal.  Skin: Skin is warm and dry.  Psychiatric: He has a normal mood and affect. His behavior is normal. Judgment and thought content normal.     Assessment/Plan C6-7 herniated nucleus pulposus with stenosis and radiculopathy. Plan C6-7 anterior cervical discectomy and fusion with allograft and plating. Risks and benefits have been explained. Patient wishes to proceed.  Preslie Depasquale A 12/15/2011, 1:02 PM

## 2011-12-16 ENCOUNTER — Encounter (HOSPITAL_COMMUNITY): Payer: Self-pay | Admitting: Neurosurgery

## 2011-12-16 MED ORDER — DABIGATRAN ETEXILATE MESYLATE 150 MG PO CAPS: 150.0000 mg | ORAL_CAPSULE | Freq: Two times a day (BID) | ORAL | Status: DC

## 2011-12-16 MED ORDER — CYCLOBENZAPRINE HCL 10 MG PO TABS: 10.0000 mg | ORAL_TABLET | Freq: Three times a day (TID) | ORAL | Status: AC | PRN

## 2011-12-16 MED ORDER — OXYCODONE-ACETAMINOPHEN 5-325 MG PO TABS: 1.0000 | ORAL_TABLET | ORAL | Status: AC | PRN

## 2011-12-16 NOTE — Discharge Summary (Signed)
Physician Discharge Summary  Patient ID: CEDRIC MCCLAINE MRN: [id number] DOB/AGE: 10-31-1960 51 y.o.  Admit date: 12/15/2011 Discharge date: 12/16/2011  Admission Diagnoses:  Discharge Diagnoses:  Principal Problem:  *Cervical disc herniation   Discharged Condition: good  Hospital Course: Patient in the hospital where he underwent an uncomplicated C6-7 anterior cervical set and fusion. Postoperatively he is done reasonably well. Still has some radicular pain. Motor and sensory function are intact. No other problems. Plan for discharge home  Consults:   Significant Diagnostic Studies:   Treatments:   Discharge Exam: Blood pressure 129/78, pulse 61, temperature 97 F (36.1 C), temperature source Oral, resp. rate 91, height 5' 10.87" (1.8 m), weight 126.3 kg (278 lb 7.1 oz), SpO2 94.00%. Awake and alert oriented appropriate her cranial nerve function is intact. Motor sensory function extremities is intact. Wound healing well. Chest abdomen benign.  Disposition: 01-Home or Self Care  Discharge Orders    Future Appointments: Provider: Department: Dept Phone: Center:   02/25/2012 9:20 AM [facility] [phone]   06/14/2012 9:00 AM Barbaraann Share, MD [facility] [phone] None     Medication List  As of 12/16/2011  9:19 AM   TAKE these medications         azelastine 137 MCG/SPRAY nasal spray   Commonly known as: ASTELIN   Place 1 spray into the nose 2 (two) times daily at 10 AM and 5 PM. Use in each nostril as directed      carvedilol 25 MG tablet   Commonly known as: COREG   Take 25 mg by mouth 2 (two) times daily with a meal.      cyclobenzaprine 10 MG tablet   Commonly known as: FLEXERIL   Take 1 tablet (10 mg total) by mouth 3 (three) times daily as needed for muscle spasms.      dabigatran 150 MG Caps   Commonly known as: PRADAXA   Take 1 capsule (150 mg total) by mouth every 12 (twelve) hours.   Start taking  on: 12/18/2011      dofetilide 500 MCG capsule   Commonly known as: TIKOSYN   Take 500 mcg by mouth 2 (two) times daily.      fluticasone 50 MCG/ACT nasal spray   Commonly known as: FLONASE   Place 2 sprays into the nose daily.      gabapentin 100 MG capsule   Commonly known as: NEURONTIN   Take 100 mg by mouth daily.      losartan 50 MG tablet   Commonly known as: COZAAR   Take 50 mg by mouth daily.      methimazole 10 MG tablet   Commonly known as: TAPAZOLE   Take 10 mg by mouth daily.      nitroGLYCERIN 0.4 MG SL tablet   Commonly known as: NITROSTAT   Place 0.4 mg under the tongue every 5 (five) minutes as needed. For chest pain      omeprazole 40 MG capsule   Commonly known as: PRILOSEC   Take 40 mg by mouth daily.      oxyCODONE-acetaminophen 5-325 MG per tablet   Commonly known as: PERCOCET   Take 1-2 tablets by mouth every 6 (six) hours as needed. For pain.      oxyCODONE-acetaminophen 5-325 MG per tablet   Commonly known as: PERCOCET   Take 1-2 tablets by mouth every 4 (four) hours as needed.      polyethylene glycol packet  Commonly known as: MIRALAX / GLYCOLAX   Take 17 g by mouth daily.      potassium chloride SA 20 MEQ tablet   Commonly known as: K-DUR,KLOR-CON   Take 20 mEq by mouth daily.      sildenafil 100 MG tablet   Commonly known as: VIAGRA   Take 100 mg by mouth daily as needed. For erectile dysfunction             Signed: Harshan Kearley A 12/16/2011, 9:19 AM

## 2011-12-16 NOTE — Discharge Instructions (Signed)
Wound Care Keep incision covered and dry for one week.  If you shower prior to then, cover incision with plastic wrap.  You may remove outer bandage after one week and shower.  Do not put any creams, lotions, or ointments on incision. Leave steri-strips on neck.  They will fall off by themselves. Activity Walk each and every day, increasing distance each day. No lifting greater than 5 lbs.  Avoid excessive neck motion. No driving for 2 weeks; may ride as a passenger locally. Wear neck brace at all times except when showering. Diet Resume your normal diet.  Return to Work Will be discussed at you follow up appointment. Call Your Doctor If Any of These Occur Redness, drainage, or swelling at the wound.  Temperature greater than 101 degrees. Severe pain not relieved by pain medication. Increased difficulty swallowing.  Incision starts to come apart. Follow Up Appt Call today for appointment in 1-2 weeks ([phone]) or for problems.  If you have any hardware placed in your spine, you will need an x-ray before your appointment. 

## 2011-12-26 ENCOUNTER — Other Ambulatory Visit: Payer: Self-pay | Admitting: Internal Medicine

## 2012-01-09 ENCOUNTER — Other Ambulatory Visit: Payer: Self-pay | Admitting: Internal Medicine

## 2012-02-10 ENCOUNTER — Other Ambulatory Visit: Payer: Self-pay | Admitting: Endocrinology

## 2012-02-12 DIAGNOSIS — E669 Obesity, unspecified: Secondary | ICD-10-CM | POA: Insufficient documentation

## 2012-02-25 ENCOUNTER — Encounter: Payer: Medicaid Other | Admitting: *Deleted

## 2012-02-29 ENCOUNTER — Encounter: Payer: Self-pay | Admitting: *Deleted

## 2012-03-08 ENCOUNTER — Other Ambulatory Visit: Payer: Self-pay | Admitting: Internal Medicine

## 2012-03-14 ENCOUNTER — Telehealth: Payer: Self-pay | Admitting: Internal Medicine

## 2012-03-14 ENCOUNTER — Other Ambulatory Visit: Payer: Self-pay | Admitting: Cardiology

## 2012-03-14 MED ORDER — LOSARTAN POTASSIUM 50 MG PO TABS: 50.0000 mg | ORAL_TABLET | Freq: Every day | ORAL | Status: DC

## 2012-03-14 NOTE — Telephone Encounter (Signed)
New msg Pt called to say that Medicaid will not cover losartan. He wants to discuss with you. Please call

## 2012-03-14 NOTE — Telephone Encounter (Signed)
I spoke with the patient today. He states [facility] will not fill his losartan anymore because Medicaid will not cover this. He states they were going to send a fax to our office about this. I explained I have not seen this come through. I will contact [facility] on [address] to discuss.

## 2012-03-15 NOTE — Telephone Encounter (Signed)
I spoke with [facility] at [address] at [phone]. Per the pharmacy staff, the patient's losartan now requires a prior authorization. The phone number for Medicaid is [phone]. I attempted to call the Medicaid Line- on hold for 5 minutes with no representative to the phone. I will call back.

## 2012-03-25 ENCOUNTER — Other Ambulatory Visit: Payer: Self-pay | Admitting: Internal Medicine

## 2012-03-25 DIAGNOSIS — I4891 Unspecified atrial fibrillation: Secondary | ICD-10-CM

## 2012-03-25 MED ORDER — DABIGATRAN ETEXILATE MESYLATE 150 MG PO CAPS: 150.0000 mg | ORAL_CAPSULE | Freq: Two times a day (BID) | ORAL | Status: DC

## 2012-04-08 ENCOUNTER — Other Ambulatory Visit: Payer: Self-pay | Admitting: Internal Medicine

## 2012-04-27 ENCOUNTER — Encounter: Payer: Self-pay | Admitting: *Deleted

## 2012-05-12 ENCOUNTER — Other Ambulatory Visit: Payer: Self-pay | Admitting: Internal Medicine

## 2012-06-14 ENCOUNTER — Ambulatory Visit: Payer: Medicaid Other | Admitting: Pulmonary Disease

## 2012-06-15 ENCOUNTER — Ambulatory Visit (INDEPENDENT_AMBULATORY_CARE_PROVIDER_SITE_OTHER): Payer: Medicaid Other | Admitting: Pulmonary Disease

## 2012-06-15 ENCOUNTER — Other Ambulatory Visit: Payer: Self-pay | Admitting: Endocrinology

## 2012-06-15 ENCOUNTER — Encounter: Payer: Self-pay | Admitting: Pulmonary Disease

## 2012-06-15 VITALS — BP 120/80 | HR 53 | Ht 71.0 in | Wt 282.4 lb

## 2012-06-15 DIAGNOSIS — G4733 Obstructive sleep apnea (adult) (pediatric): Secondary | ICD-10-CM

## 2012-06-15 NOTE — Assessment & Plan Note (Signed)
The patient is doing well overall with CPAP, with his only complaint being too much water in the system.  I have instructed him to turn the heat down on his humidifier, and this should take care of the issue.  He is having some mouth dryness, and I suspect this is because of nasal obstruction.  This will improve once he has his turbinate reduction.  Finally, I encouraged him to work aggressively on weight loss.

## 2012-06-15 NOTE — Progress Notes (Signed)
  Subjective:    Patient ID: KUPER RENNELS, male    DOB: 1961/05/27, 51 y.o.   MRN: [id number]  HPI The patient comes in today for followup of his known obstructive sleep apnea.  He is wearing CPAP compliantly, and feels that it does help his sleep and daytime alertness.  He is having an issue with too much moisture in the system, however he has not turned down the heat on his humidifier.  He has had great difficulty with allergic rhinitis and nasal obstruction, and has seen otolaryngology who has recommended turbinate reduction surgery.  Of note, the patient's weight is up a few pounds from the last visit.   Review of Systems  Constitutional: Negative for fever and unexpected weight change.  HENT: Positive for congestion, sneezing and sinus pressure. Negative for ear pain, nosebleeds, sore throat, rhinorrhea, trouble swallowing, dental problem and postnasal drip.   Eyes: Negative for redness and itching.  Respiratory: Positive for shortness of breath and wheezing. Negative for cough and chest tightness.   Cardiovascular: Positive for leg swelling. Negative for palpitations.  Gastrointestinal: Negative for nausea and vomiting.  Genitourinary: Negative for dysuria.  Musculoskeletal: Positive for joint swelling.  Skin: Negative for rash.  Neurological: Positive for headaches.  Hematological: Does not bruise/bleed easily.  Psychiatric/Behavioral: Negative for dysphoric mood. The patient is not nervous/anxious.        Objective:   Physical Exam Obese male in no acute distress Skin breakdown or pressure necrosis from the CPAP mask No lymphadenopathy or thyromegaly Chest clear to auscultation Lower extremities without significant edema, no cyanosis Alert, does not appear to be sleepy, moves all 4 extremities.       Assessment & Plan:

## 2012-06-15 NOTE — Patient Instructions (Addendum)
Turn heat down on humidifier to get less moisture. Work on weight loss I expect cpap tolerance to be better once your nasal airway is improved.  followup with me in one year if doing well.

## 2012-07-12 ENCOUNTER — Encounter: Payer: Self-pay | Admitting: *Deleted

## 2012-07-19 ENCOUNTER — Encounter: Payer: Self-pay | Admitting: Internal Medicine

## 2012-07-19 ENCOUNTER — Ambulatory Visit (INDEPENDENT_AMBULATORY_CARE_PROVIDER_SITE_OTHER): Payer: Medicaid Other | Admitting: Internal Medicine

## 2012-07-19 VITALS — BP 144/93 | HR 53 | Resp 18 | Ht 71.0 in | Wt 286.2 lb

## 2012-07-19 DIAGNOSIS — I4891 Unspecified atrial fibrillation: Secondary | ICD-10-CM

## 2012-07-19 DIAGNOSIS — I5042 Chronic combined systolic (congestive) and diastolic (congestive) heart failure: Secondary | ICD-10-CM

## 2012-07-19 DIAGNOSIS — Z950 Presence of cardiac pacemaker: Secondary | ICD-10-CM

## 2012-07-19 DIAGNOSIS — E0500 Thyrotoxicosis with diffuse goiter without thyrotoxic crisis or storm: Secondary | ICD-10-CM

## 2012-07-19 LAB — PACEMAKER DEVICE OBSERVATION
ATRIAL PACING PM: 19
BATTERY VOLTAGE: 2.79 V
BMOD-0003RA: 30
BMOD-0005RA: 95 {beats}/min

## 2012-07-19 LAB — COMPREHENSIVE METABOLIC PANEL
ALT: 20 U/L (ref 0–53)
Alkaline Phosphatase: 88 U/L (ref 39–117)
CO2: 27 mEq/L (ref 19–32)
Creatinine, Ser: 1.1 mg/dL (ref 0.4–1.5)
GFR: 91 mL/min (ref >60.00)
Total Bilirubin: 0.6 mg/dL (ref 0.3–1.2)

## 2012-07-19 NOTE — Patient Instructions (Addendum)
Remote monitoring is used to monitor your Pacemaker of ICD from home. This monitoring reduces the number of office visits required to check your device to one time per year. It allows [name] to keep an eye on the functioning of your device to ensure it is working properly. You are scheduled for a device check from home on October 24, 2012. You may send your transmission at any time that day. If you have a wireless device, the transmission will be sent automatically. After your physician reviews your transmission, you will receive a postcard with your next transmission date.  Your physician wants you to follow-up in: 1 year with Dr Graciela Husbands.  You will receive a reminder letter in the mail two months in advance. If you don't receive a letter, please call our office to schedule the follow-up appointment.  Your physician recommends that you schedule a follow-up appointment in: 4 months with Dr. Graciela Husbands.  STOP TIKOSYN.  LABS TODAY:  TSH, T3, T4, & CMET.  Please schedule pt an appointment with Dr. Everardo All, Primary Care.

## 2012-07-19 NOTE — Progress Notes (Signed)
Patient Care Team: Maryelizabeth Rowan, MD as PCP - General (Family Medicine) Mardella Layman, MD (Gastroenterology)   HPI  [name] is a 51 y.o. male Seen in followup for pacemaker implanted for sinus node dysfunction and paroxysmal atrial arrhythmias which persisted despite dofetilide. He underwent catheter ablation of atrial fibrillation 2012.  His heart has been doing quite well. He has had very little symptomatic atrial fibrillation. He does have need for nasal surgery as well as a myelogram.  He has a history of ischemic heart disease with prior non-STEMI but no significant obstructive lesions noted. He also is a history of pulmonary embolism  Past Medical History  Diagnosis Date  . GERD (gastroesophageal reflux disease)   . HTN (hypertension)   . Obesity   . CAD-nonobstructive     S/P nstemi and cath 05/2008 - nonobs. dzs.  . Pulmonary embolism 04/2003    Hx of  . Atrial fibrillation     s/p afib ablation  . Sinus node dysfunction     s/p MDT PPM  . Hyperlipemia   . Systolic and diastolic CHF, chronic     EF 35% by most recent echo 09/19/10  . PUD (peptic ulcer disease)     gastritis due to ETOH previously  . Hyperthyroidism     Graves dz on methimazole and followed by Dr Everardo All  . Polysubstance abuse     cocaine, last used 1999  . Pancreatitis   . Rectal bleeding   . Hemorrhoids   . Myocardial infarction     DR Graciela Husbands   . Pacemaker-medtronic   . Angina     LEFT CHEST PAIN FRIDAY  CAME TO ED   . Shortness of breath     LYING DOWN AT TIMES  . Sleep apnea     uses CPAP [facility] 2 YRS  . DDD (degenerative disc disease)     evaluate by neurosurgery is ongoing  . Depression   . Erectile dysfunction     Past Surgical History  Procedure Date  . Pacemaker insertion 10/09    by Dr Graciela Husbands  . Fingers removed from right hand. 2/84    traumatic work injury  . Lumbar spine surgery 02/2009    Dr. Dutch Quint  . Atrial ablation surgery 12/2010    afib ablation  by Dr Johney Frame  . Insert / replace / remove pacemaker     05/2008    . Cardiac catheterization     CADIAC ABLATION DR Johney Frame 12/2010  . Anterior cervical decomp/discectomy fusion 12/15/2011    Procedure: ANTERIOR CERVICAL DECOMPRESSION/DISCECTOMY FUSION 1 LEVEL;  Surgeon: Temple Pacini, MD;  Location: [facility];  Service: Neurosurgery;  Laterality: N/A;  Cervical Six-Seven Anterior Cervical Diskectomy fusion with Allograft and Plating    Current Outpatient Prescriptions  Medication Sig Dispense Refill  . Azelastine HCl (ASTELIN NA) Place into the nose.      . carvedilol (COREG) 25 MG tablet Take 25 mg by mouth 2 (two) times daily with a meal.       . dabigatran (PRADAXA) 150 MG CAPS Take 1 capsule (150 mg total) by mouth every 12 (twelve) hours.  60 capsule  5  . dofetilide (TIKOSYN) 500 MCG capsule Take 500 mcg by mouth 2 (two) times daily.      . fluticasone (FLONASE) 50 MCG/ACT nasal spray Place 2 sprays into the nose daily.      [name] gabapentin (NEURONTIN) 100 MG capsule Take 100 mg by mouth daily.      [name]  KLOR-CON M20 20 MEQ tablet TAKE 1 TABLET BY MOUTH EVERY DAY  30 tablet  3  . loratadine (CLARITIN) 10 MG tablet Take 10 mg by mouth daily.      [name] losartan (COZAAR) 50 MG tablet Take 1 tablet (50 mg total) by mouth daily.  30 tablet  3  . methimazole (TAPAZOLE) 10 MG tablet Take 10 mg by mouth daily.      . methimazole (TAPAZOLE) 10 MG tablet TAKE 1 TABLET BY MOUTH ONCE DAILY  30 tablet  1  . montelukast (SINGULAIR) 10 MG tablet Take 10 mg by mouth at bedtime.      . nitroGLYCERIN (NITROSTAT) 0.4 MG SL tablet Place 0.4 mg under the tongue every 5 (five) minutes as needed. For chest pain      . omeprazole (PRILOSEC) 40 MG capsule Take 40 mg by mouth daily.       [name] oxyCODONE-acetaminophen (PERCOCET) 5-325 MG per tablet Take 1-2 tablets by mouth every 6 (six) hours as needed. For pain.      . polyethylene glycol (MIRALAX / GLYCOLAX) packet Take 17 g by mouth daily.        . sildenafil (VIAGRA)  100 MG tablet Take 100 mg by mouth daily as needed. For erectile dysfunction      . [DISCONTINUED] vardenafil (LEVITRA) 20 MG tablet Take 1 tablet (20 mg total) by mouth daily as needed for erectile dysfunction.  20 tablet  3    Allergies  Allergen Reactions  . Celebrex (Celecoxib) Itching  . Hydrocodone Itching  . Prednisolone Itching    Review of Systems negative except from HPI and PMH  Physical Exam BP 144/93  Pulse 53  Resp 18  Ht 5\' 11"  (1.803 m)  Wt 286 lb 3.2 oz (129.819 kg)  BMI 39.92 kg/m2 Well developed and well nourished in no acute distress HENT normal E scleral and icterus clear Neck Supple JVP flat; carotids brisk and full Clear to ausculation  Regular rate and rhythm, no murmurs gallops or rub Soft with active bowel sounds No clubbing cyanosis none Edema Alert and oriented, grossly normal motor and sensory function Skin Warm and Dry  Electrocardiogram demonstrates atrial pacing with intrinsic conduction Intervals 16/11/46   Assessment and  Plan

## 2012-07-19 NOTE — Assessment & Plan Note (Signed)
Patient has 5% atrial fibrillation which has been stable over the last year. We will plan to discontinue his dofetilide and see him again in 4 months time. He does have multiple risk factors for thromboembolism so we will continue him on Pradaxa. However, they're not sufficiently high risk that he cannot come off without bridging for his surgeries.

## 2012-07-19 NOTE — Assessment & Plan Note (Signed)
The patient's device was interrogated.  The information was reviewed. No changes were made in the programming.    

## 2012-07-19 NOTE — Assessment & Plan Note (Signed)
Stable class II symptoms continue current meds

## 2012-07-19 NOTE — Assessment & Plan Note (Signed)
" >>  ASSESSMENT AND PLAN FOR COMBINED HEART FAILURE, CHRONIC WRITTEN ON 07/19/2012 12:13 PM BY Hulet Ehrmann C, MD  Stable class II symptoms continue current meds "

## 2012-07-19 NOTE — Assessment & Plan Note (Signed)
He is not been followed closely. We'll check his thyroids today and refer him back to Dr. Everardo All

## 2012-07-20 LAB — T4: T4, Total: 9.1 ug/dL (ref 5.0–12.5)

## 2012-07-21 ENCOUNTER — Other Ambulatory Visit: Payer: Self-pay | Admitting: Internal Medicine

## 2012-07-21 ENCOUNTER — Ambulatory Visit (INDEPENDENT_AMBULATORY_CARE_PROVIDER_SITE_OTHER): Payer: Medicaid Other | Admitting: Endocrinology

## 2012-07-21 ENCOUNTER — Encounter: Payer: Self-pay | Admitting: Endocrinology

## 2012-07-21 VITALS — BP 124/80 | HR 50 | Temp 98.6°F | Wt 286.0 lb

## 2012-07-21 DIAGNOSIS — E0590 Thyrotoxicosis, unspecified without thyrotoxic crisis or storm: Secondary | ICD-10-CM

## 2012-07-21 NOTE — Progress Notes (Signed)
Subjective:    Patient ID: [name], male    DOB: 1961-08-11, 51 y.o.   MRN: [id number]  HPI pt has h/o hyperthyroidism, etiology uncertain (dx'ed 2004; he has been rx'ed with tapazole; due to his heart dz, he hasn't been able to go-off tapazole for w/u and possible i-131 rx; he has never had thyroid imaging). Past Medical History  Diagnosis Date  . GERD (gastroesophageal reflux disease)   . HTN (hypertension)   . Obesity   . CAD-nonobstructive     S/P nstemi and cath 05/2008 - nonobs. dzs.  . Pulmonary embolism 04/2003    Hx of  . Atrial fibrillation     s/p afib ablation  . Sinus node dysfunction     s/p MDT PPM  . Hyperlipemia   . Systolic and diastolic CHF, chronic     EF 35% by most recent echo 09/19/10  . PUD (peptic ulcer disease)     gastritis due to ETOH previously  . Hyperthyroidism     Graves dz on methimazole and followed by Dr Everardo All  . Polysubstance abuse     cocaine, last used 1999  . Pancreatitis   . Rectal bleeding   . Hemorrhoids   . Myocardial infarction     DR Graciela Husbands   . Pacemaker-medtronic   . Angina     LEFT CHEST PAIN FRIDAY  CAME TO ED   . Shortness of breath     LYING DOWN AT TIMES  . Sleep apnea     uses CPAP [facility] 2 YRS  . DDD (degenerative disc disease)     evaluate by neurosurgery is ongoing  . Depression   . Erectile dysfunction     Past Surgical History  Procedure Date  . Pacemaker insertion 10/09    by Dr Graciela Husbands  . Fingers removed from right hand. 2/84    traumatic work injury  . Lumbar spine surgery 02/2009    Dr. Dutch Quint  . Atrial ablation surgery 12/2010    afib ablation by Dr Johney Frame  . Insert / replace / remove pacemaker     05/2008    . Cardiac catheterization     CADIAC ABLATION DR Johney Frame 12/2010  . Anterior cervical decomp/discectomy fusion 12/15/2011    Procedure: ANTERIOR CERVICAL DECOMPRESSION/DISCECTOMY FUSION 1 LEVEL;  Surgeon: Temple Pacini, MD;  Location: [facility];  Service: Neurosurgery;   Laterality: N/A;  Cervical Six-Seven Anterior Cervical Diskectomy fusion with Allograft and Plating    History   Social History  . Marital Status: Legally Separated    Spouse Name: N/A    Number of Children: 7  . Years of Education: N/A   Occupational History  . unemployed    Social History Main Topics  . Smoking status: Former Smoker -- 1.0 packs/day for 15 years    Types: Cigarettes    Quit date: 04/04/2003  . Smokeless tobacco: Not on file  . Alcohol Use: No     Comment: formerly heavy ETOH,  now drinks a fifth of liquor every 2 weeks  . Drug Use: No     Comment: previously used crack cocaine, quit 1999,  + marijuana  . Sexually Active: Not on file   Other Topics Concern  . Not on file   Social History Narrative   Unemployed currently.  Previously worked as a Curator.  Lives in [place].    Current Outpatient Prescriptions on File Prior to Visit  Medication Sig Dispense Refill  . Azelastine HCl (  ASTELIN NA) Place into the nose.      . carvedilol (COREG) 25 MG tablet Take 25 mg by mouth 2 (two) times daily with a meal.       . dabigatran (PRADAXA) 150 MG CAPS Take 1 capsule (150 mg total) by mouth every 12 (twelve) hours.  60 capsule  5  . dofetilide (TIKOSYN) 500 MCG capsule Take 500 mcg by mouth 2 (two) times daily.      . fluticasone (FLONASE) 50 MCG/ACT nasal spray Place 2 sprays into the nose daily.      [name] gabapentin (NEURONTIN) 100 MG capsule Take 100 mg by mouth daily.      [name] KLOR-CON M20 20 MEQ tablet TAKE 1 TABLET BY MOUTH EVERY DAY  30 tablet  3  . loratadine (CLARITIN) 10 MG tablet Take 10 mg by mouth daily.      [name] losartan (COZAAR) 50 MG tablet Take 1 tablet (50 mg total) by mouth daily.  30 tablet  3  . methimazole (TAPAZOLE) 10 MG tablet Take 10 mg by mouth daily.      . montelukast (SINGULAIR) 10 MG tablet Take 10 mg by mouth at bedtime.      . nitroGLYCERIN (NITROSTAT) 0.4 MG SL tablet Place 0.4 mg under the tongue every 5 (five) minutes as needed.  For chest pain      . omeprazole (PRILOSEC) 40 MG capsule Take 40 mg by mouth daily.       [name] oxyCODONE-acetaminophen (PERCOCET) 5-325 MG per tablet Take 1-2 tablets by mouth every 6 (six) hours as needed. For pain.      . polyethylene glycol (MIRALAX / GLYCOLAX) packet Take 17 g by mouth daily.        . sildenafil (VIAGRA) 100 MG tablet Take 100 mg by mouth daily as needed. For erectile dysfunction      . [DISCONTINUED] methimazole (TAPAZOLE) 10 MG tablet TAKE 1 TABLET BY MOUTH ONCE DAILY  30 tablet  1  . [DISCONTINUED] vardenafil (LEVITRA) 20 MG tablet Take 1 tablet (20 mg total) by mouth daily as needed for erectile dysfunction.  20 tablet  3    Allergies  Allergen Reactions  . Celebrex (Celecoxib) Itching  . Hydrocodone Itching  . Prednisolone Itching    Family History  Problem Relation Age of Onset  . Heart disease Mother   . Breast cancer Mother   . Lung cancer Father   . Diabetes    . Colon cancer Neg Hx     BP 124/80  Pulse 50  Temp 98.6 F (37 C) (Oral)  Wt 286 lb (129.729 kg)  SpO2 98%   Review of Systems Denies fever    Objective:   Physical Exam VITAL SIGNS:  See vs page GENERAL: no distress NECK: There is no palpable thyroid enlargement.  No thyroid nodule is palpable.  No palpable lymphadenopathy at the anterior neck.  Lab Results  Component Value Date   TSH 3.50 07/19/2012   T3TOTAL 152.7 07/19/2012   T4TOTAL 9.1 07/19/2012      Assessment & Plan:  Hyperthyroidism, well-controlled

## 2012-07-21 NOTE — Telephone Encounter (Signed)
Pt is completely out of medication

## 2012-07-21 NOTE — Patient Instructions (Addendum)
Please continue the same methimazole.  if ever you have fever while taking methimazole, stop it and call us, because of the risk of a rare side-effect.   Please come back for a follow-up appointment in 6 months.   

## 2012-07-22 ENCOUNTER — Other Ambulatory Visit: Payer: Self-pay | Admitting: *Deleted

## 2012-07-22 MED ORDER — LOSARTAN POTASSIUM 50 MG PO TABS: 50.0000 mg | ORAL_TABLET | Freq: Every day | ORAL | Status: DC

## 2012-07-22 NOTE — Telephone Encounter (Signed)
F/u   Pt calling back today, regarding Losartan 50 MG Tab called in yesterday.  Verified preferred pharmacy as [facility] [address]

## 2012-07-29 ENCOUNTER — Other Ambulatory Visit: Payer: Self-pay | Admitting: Neurosurgery

## 2012-07-29 DIAGNOSIS — M4712 Other spondylosis with myelopathy, cervical region: Secondary | ICD-10-CM

## 2012-07-29 DIAGNOSIS — M4714 Other spondylosis with myelopathy, thoracic region: Secondary | ICD-10-CM

## 2012-08-04 ENCOUNTER — Telehealth: Payer: Self-pay | Admitting: Internal Medicine

## 2012-08-04 NOTE — Telephone Encounter (Signed)
New problem:    tikosyn was discontinue . C/O having chest pain more frequently almost every day.

## 2012-08-04 NOTE — Telephone Encounter (Signed)
Left message for pt to call back  °

## 2012-08-09 ENCOUNTER — Other Ambulatory Visit: Payer: Self-pay | Admitting: Endocrinology

## 2012-08-10 ENCOUNTER — Other Ambulatory Visit: Payer: Medicaid Other

## 2012-08-10 ENCOUNTER — Inpatient Hospital Stay: Admission: RE | Admit: 2012-08-10 | Payer: Medicaid Other | Source: Ambulatory Visit

## 2012-08-15 ENCOUNTER — Ambulatory Visit
Admission: RE | Admit: 2012-08-15 | Discharge: 2012-08-15 | Disposition: A | Discharge status: Home / Self Care | Payer: Medicaid Other | Source: Ambulatory Visit | Attending: Neurosurgery | Admitting: Neurosurgery

## 2012-08-15 VITALS — BP 151/82 | HR 49

## 2012-08-15 DIAGNOSIS — M4714 Other spondylosis with myelopathy, thoracic region: Secondary | ICD-10-CM

## 2012-08-15 DIAGNOSIS — M4712 Other spondylosis with myelopathy, cervical region: Secondary | ICD-10-CM

## 2012-08-15 MED ORDER — DIAZEPAM 5 MG PO TABS
10.0000 mg | ORAL_TABLET | Freq: Once | ORAL | Status: AC
  Administered 2012-08-15: 10 mg via ORAL

## 2012-08-15 MED ORDER — ONDANSETRON HCL 4 MG/2ML IJ SOLN: 4.0000 mg | Freq: Four times a day (QID) | INTRAMUSCULAR | Status: DC | PRN

## 2012-08-15 MED ORDER — IOHEXOL 300 MG/ML  SOLN
10.0000 mL | Freq: Once | INTRAMUSCULAR | Status: AC | PRN
  Administered 2012-08-15: 10 mL via INTRATHECAL

## 2012-08-15 NOTE — Progress Notes (Signed)
Pt states he has been off pradaxa for the past 2 days.

## 2012-08-15 NOTE — Progress Notes (Signed)
Called pt's ride to be here at 10:00 am.

## 2012-08-31 DIAGNOSIS — K000 Anodontia: Secondary | ICD-10-CM

## 2012-08-31 HISTORY — DX: Anodontia: K00.0

## 2012-09-02 DIAGNOSIS — I2699 Other pulmonary embolism without acute cor pulmonale: Secondary | ICD-10-CM | POA: Insufficient documentation

## 2012-09-02 DIAGNOSIS — I495 Sick sinus syndrome: Secondary | ICD-10-CM | POA: Insufficient documentation

## 2012-09-02 DIAGNOSIS — K219 Gastro-esophageal reflux disease without esophagitis: Secondary | ICD-10-CM | POA: Insufficient documentation

## 2012-09-15 ENCOUNTER — Other Ambulatory Visit: Payer: Self-pay | Admitting: Internal Medicine

## 2012-09-19 ENCOUNTER — Other Ambulatory Visit: Payer: Self-pay | Admitting: Internal Medicine

## 2012-09-26 DIAGNOSIS — Z0279 Encounter for issue of other medical certificate: Secondary | ICD-10-CM

## 2012-09-27 ENCOUNTER — Telehealth: Payer: Self-pay | Admitting: Internal Medicine

## 2012-09-27 NOTE — Telephone Encounter (Signed)
Walk In Pt Form " [employer]" form Dropped Off  Pt needs Completed Sent to [facility]  09/27/12/KM

## 2012-10-12 ENCOUNTER — Other Ambulatory Visit: Payer: Self-pay

## 2012-10-12 MED ORDER — METHIMAZOLE 10 MG PO TABS: 10.0000 mg | ORAL_TABLET | Freq: Every day | ORAL | Status: DC

## 2012-10-24 ENCOUNTER — Encounter: Payer: Medicaid Other | Admitting: *Deleted

## 2012-10-26 ENCOUNTER — Other Ambulatory Visit: Payer: Self-pay

## 2012-10-26 MED ORDER — METHIMAZOLE 10 MG PO TABS: 10.0000 mg | ORAL_TABLET | Freq: Every day | ORAL | Status: DC

## 2012-11-03 ENCOUNTER — Encounter: Payer: Self-pay | Admitting: *Deleted

## 2012-11-17 ENCOUNTER — Encounter: Payer: Self-pay | Admitting: Internal Medicine

## 2012-11-17 ENCOUNTER — Ambulatory Visit (INDEPENDENT_AMBULATORY_CARE_PROVIDER_SITE_OTHER): Payer: Medicaid Other | Admitting: Internal Medicine

## 2012-11-17 VITALS — BP 147/94 | HR 62 | Ht 71.0 in | Wt 288.4 lb

## 2012-11-17 DIAGNOSIS — Z01810 Encounter for preprocedural cardiovascular examination: Secondary | ICD-10-CM

## 2012-11-17 DIAGNOSIS — Z950 Presence of cardiac pacemaker: Secondary | ICD-10-CM

## 2012-11-17 DIAGNOSIS — I4891 Unspecified atrial fibrillation: Secondary | ICD-10-CM

## 2012-11-17 DIAGNOSIS — I5042 Chronic combined systolic (congestive) and diastolic (congestive) heart failure: Secondary | ICD-10-CM

## 2012-11-17 LAB — PACEMAKER DEVICE OBSERVATION
AL AMPLITUDE: 2 mv
AL IMPEDENCE PM: 468 Ohm
AL THRESHOLD: 0.5 V
ATRIAL PACING PM: 13
BMOD-0003RA: 30
BRDY-0002RA: 50 {beats}/min

## 2012-11-17 NOTE — Assessment & Plan Note (Signed)
" >>  ASSESSMENT AND PLAN FOR COMBINED HEART FAILURE, CHRONIC WRITTEN ON 11/17/2012  9:39 AM BY Jamyla Ard C, MD  Euvolemic. On ACE inhibitors and beta blockers; with interval improvement in ejection fraction to 45-50%, we will not add hydralazine /nitrates "

## 2012-11-17 NOTE — Assessment & Plan Note (Signed)
The patient's device was interrogated.  The information was reviewed. No changes were made in the programming.    

## 2012-11-17 NOTE — Patient Instructions (Addendum)
Remote monitoring is used to monitor your Pacemaker of ICD from home. This monitoring reduces the number of office visits required to check your device to one time per year. It allows [name] to keep an eye on the functioning of your device to ensure it is working properly. You are scheduled for a device check from home on 02/20/2013. You may send your transmission at any time that day. If you have a wireless device, the transmission will be sent automatically. After your physician reviews your transmission, you will receive a postcard with your next transmission date.  Your physician wants you to follow-up in: 6 months. You will receive a reminder letter in the mail two months in advance. If you don't receive a letter, please call our office to schedule the follow-up appointment.  Your physician recommends that you continue on your current medications as directed. Please refer to the Current Medication list given to you today.

## 2012-11-17 NOTE — Assessment & Plan Note (Signed)
Currently he is stable. I think his risks for surgery would be acceptable. If his functional status remained stable over the next week prior to surgery would recommend proceeding maintaining his beta blocker therapy

## 2012-11-17 NOTE — Progress Notes (Signed)
kf Patient Care Team: Maryelizabeth Rowan as PCP - General (Family Medicine) Mardella Layman, MD (Gastroenterology)   HPI  [name] is a 52 y.o. male Seen in followup for pacemaker implanted for sinus node dysfunction and paroxysmal atrial arrhythmias which persisted despite dofetilide. He underwent catheter ablation of atrial fibrillation 2012.   His heart has been doing quite well. He has had very little symptomatic atrial fibrillation.    otherewise no change in functional status  Anticipating neck surgery next month  He has a history of ischemic heart disease with prior non-STEMI but no significant obstructive lesions noted. He also is a history of pulmonary embolism  Echocardiogram 2012 demonstrated mild LV dysfunction 45-50%   Past Medical History  Diagnosis Date  . GERD (gastroesophageal reflux disease)   . HTN (hypertension)   . Obesity   . CAD-nonobstructive     S/P nstemi and cath 05/2008 - nonobs. dzs.  . Pulmonary embolism 04/2003    Hx of  . Atrial fibrillation     s/p afib ablation  . Sinus node dysfunction     s/p MDT PPM  . Hyperlipemia   . Systolic and diastolic CHF, chronic     EF 35% by most recent echo 09/19/10  . PUD (peptic ulcer disease)     gastritis due to ETOH previously  . Hyperthyroidism     Graves dz on methimazole and followed by Dr Everardo All  . Polysubstance abuse     cocaine, last used 1999  . Pancreatitis   . Rectal bleeding   . Hemorrhoids   . Myocardial infarction     DR Graciela Husbands   . Pacemaker-medtronic   . Angina     LEFT CHEST PAIN FRIDAY  CAME TO ED   . Shortness of breath     LYING DOWN AT TIMES  . Sleep apnea     uses CPAP [facility] 2 YRS  . DDD (degenerative disc disease)     evaluate by neurosurgery is ongoing  . Depression   . Erectile dysfunction     Past Surgical History  Procedure Laterality Date  . Pacemaker insertion  10/09    by Dr Graciela Husbands  . Fingers removed from right hand.  2/84    traumatic work  injury  . Lumbar spine surgery  02/2009    Dr. Dutch Quint  . Atrial ablation surgery  12/2010    afib ablation by Dr Johney Frame  . Insert / replace / remove pacemaker      05/2008    . Cardiac catheterization      CADIAC ABLATION DR Johney Frame 12/2010  . Anterior cervical decomp/discectomy fusion  12/15/2011    Procedure: ANTERIOR CERVICAL DECOMPRESSION/DISCECTOMY FUSION 1 LEVEL;  Surgeon: Temple Pacini, MD;  Location: [facility];  Service: Neurosurgery;  Laterality: N/A;  Cervical Six-Seven Anterior Cervical Diskectomy fusion with Allograft and Plating    Current Outpatient Prescriptions  Medication Sig Dispense Refill  . Azelastine HCl (ASTELIN NA) Place into the nose.      . carvedilol (COREG) 25 MG tablet Take 25 mg by mouth 2 (two) times daily with a meal.       . dabigatran (PRADAXA) 150 MG CAPS Take 1 capsule (150 mg total) by mouth every 12 (twelve) hours.  60 capsule  5  . dofetilide (TIKOSYN) 500 MCG capsule Take 500 mcg by mouth 2 (two) times daily.      . fluticasone (FLONASE) 50 MCG/ACT nasal spray Place 2 sprays into  the nose daily.      [name] gabapentin (NEURONTIN) 100 MG capsule Take 100 mg by mouth daily.      [name] KLOR-CON M20 20 MEQ tablet TAKE 1 TABLET BY MOUTH EVERY DAY  30 tablet  3  . loratadine (CLARITIN) 10 MG tablet Take 10 mg by mouth daily.      [name] losartan (COZAAR) 50 MG tablet Take 1 tablet (50 mg total) by mouth daily.  30 tablet  3  . methimazole (TAPAZOLE) 10 MG tablet TAKE 1 TABLET DAILY  30 tablet  1  . methimazole (TAPAZOLE) 10 MG tablet Take 1 tablet (10 mg total) by mouth daily.  30 tablet  2  . montelukast (SINGULAIR) 10 MG tablet Take 10 mg by mouth at bedtime.      . nitroGLYCERIN (NITROSTAT) 0.4 MG SL tablet Place 0.4 mg under the tongue every 5 (five) minutes as needed. For chest pain      . omeprazole (PRILOSEC) 40 MG capsule Take 40 mg by mouth daily.       [name] oxyCODONE-acetaminophen (PERCOCET) 5-325 MG per tablet Take 1-2 tablets by mouth every 6 (six) hours as  needed. For pain.      . polyethylene glycol (MIRALAX / GLYCOLAX) packet Take 17 g by mouth daily.        . sildenafil (VIAGRA) 100 MG tablet Take 100 mg by mouth daily as needed. For erectile dysfunction      . [DISCONTINUED] vardenafil (LEVITRA) 20 MG tablet Take 1 tablet (20 mg total) by mouth daily as needed for erectile dysfunction.  20 tablet  3   No current facility-administered medications for this visit.    Allergies  Allergen Reactions  . Celebrex (Celecoxib) Itching  . Hydrocodone Itching  . Prednisolone Itching    Review of Systems negative except from HPI and PMH  Physical Exam BP 147/94  Pulse 62  Ht 5\' 11"  (1.803 m)  Wt 288 lb 6.4 oz (130.817 kg)  BMI 40.24 kg/m2 Well developed and well nourished in no acute distress HENT normal E scleral and icterus clear Neck Supple JVP flat; carotids brisk and full Clear to ausculation  Regular rate and rhythm, no murmurs gallops or rub Soft with active bowel sounds No clubbing cyanosis none Edema Alert and oriented, grossly normal motor and sensory function Skin Warm and Dry ECG demonstrates sinus at 60 intervals 17/10/40  Assessment and  Plan The morning of the RU is a former so he is not that he is in the subcutaneous tissues different

## 2012-11-17 NOTE — Assessment & Plan Note (Signed)
Atrial fibrillation persists at about 20%. It is however largely asymptomatic. He is taking his dabigitran. Is a single chamber device and so we do not have information related to ventricular rate

## 2012-11-17 NOTE — Assessment & Plan Note (Addendum)
Euvolemic. On ACE inhibitors and beta blockers; with interval improvement in ejection fraction to 45-50%, we will not add hydralazine/nitrates

## 2012-11-29 ENCOUNTER — Other Ambulatory Visit: Payer: Self-pay | Admitting: *Deleted

## 2012-11-29 DIAGNOSIS — I4891 Unspecified atrial fibrillation: Secondary | ICD-10-CM

## 2012-11-29 MED ORDER — DABIGATRAN ETEXILATE MESYLATE 150 MG PO CAPS: 150.0000 mg | ORAL_CAPSULE | Freq: Two times a day (BID) | ORAL | Status: DC

## 2012-12-05 ENCOUNTER — Telehealth: Payer: Self-pay | Admitting: Internal Medicine

## 2012-12-05 DIAGNOSIS — I4891 Unspecified atrial fibrillation: Secondary | ICD-10-CM

## 2012-12-05 MED ORDER — DABIGATRAN ETEXILATE MESYLATE 150 MG PO CAPS: 150.0000 mg | ORAL_CAPSULE | Freq: Two times a day (BID) | ORAL | Status: DC

## 2012-12-05 NOTE — Telephone Encounter (Signed)
Call from scheduling the patient is upset that the pharmacy has been trying to refill his Pradaxa 150 mg x several calls with no response. I explained to the patient that no calls from the pharmacy have been documented. I have received no fax/ e-scribe. I will refill this for him now. Pradaxa 150 mg BID #60 w/ [facility] [address] [facility]. I spoke with the pharmacist. I called the patient back and made him aware of this.

## 2012-12-05 NOTE — Telephone Encounter (Signed)
ERROR

## 2012-12-06 ENCOUNTER — Other Ambulatory Visit: Payer: Self-pay | Admitting: *Deleted

## 2012-12-06 NOTE — Telephone Encounter (Signed)
Opened in Error.

## 2012-12-19 ENCOUNTER — Encounter: Payer: Self-pay | Admitting: Emergency Medicine

## 2012-12-19 ENCOUNTER — Other Ambulatory Visit: Payer: Self-pay | Admitting: Internal Medicine

## 2012-12-19 ENCOUNTER — Other Ambulatory Visit: Payer: Self-pay | Admitting: *Deleted

## 2012-12-19 MED ORDER — LOSARTAN POTASSIUM 50 MG PO TABS: 50.0000 mg | ORAL_TABLET | Freq: Every day | ORAL | Status: DC

## 2012-12-19 MED ORDER — POTASSIUM CHLORIDE CRYS ER 20 MEQ PO TBCR: EXTENDED_RELEASE_TABLET | ORAL | Status: DC

## 2013-02-01 ENCOUNTER — Other Ambulatory Visit: Payer: Self-pay | Admitting: Neurosurgery

## 2013-02-01 DIAGNOSIS — M5416 Radiculopathy, lumbar region: Secondary | ICD-10-CM

## 2013-02-10 ENCOUNTER — Other Ambulatory Visit: Payer: Medicaid Other

## 2013-02-16 ENCOUNTER — Other Ambulatory Visit: Payer: Medicaid Other

## 2013-02-20 ENCOUNTER — Encounter: Payer: Medicaid Other | Admitting: *Deleted

## 2013-02-23 ENCOUNTER — Ambulatory Visit
Admission: RE | Admit: 2013-02-23 | Discharge: 2013-02-23 | Disposition: A | Discharge status: Home / Self Care | Payer: Medicaid Other | Source: Ambulatory Visit | Attending: Neurosurgery | Admitting: Neurosurgery

## 2013-02-23 VITALS — BP 135/70 | HR 49

## 2013-02-23 DIAGNOSIS — M5416 Radiculopathy, lumbar region: Secondary | ICD-10-CM

## 2013-02-23 MED ORDER — IOHEXOL 180 MG/ML  SOLN
18.0000 mL | Freq: Once | INTRAMUSCULAR | Status: AC | PRN
  Administered 2013-02-23: 18 mL via INTRATHECAL

## 2013-02-23 MED ORDER — DIAZEPAM 5 MG PO TABS
10.0000 mg | ORAL_TABLET | Freq: Once | ORAL | Status: AC
  Administered 2013-02-23: 10 mg via ORAL

## 2013-02-23 NOTE — Progress Notes (Signed)
Pt states he has been off of pradaxa since Tuesday. Discharge instructions explained.  JKL RN

## 2013-02-28 ENCOUNTER — Encounter: Payer: Self-pay | Admitting: *Deleted

## 2013-04-19 ENCOUNTER — Other Ambulatory Visit: Payer: Self-pay | Admitting: Neurosurgery

## 2013-04-26 ENCOUNTER — Ambulatory Visit: Payer: Medicaid Other | Admitting: Internal Medicine

## 2013-04-27 ENCOUNTER — Telehealth: Payer: Self-pay | Admitting: Internal Medicine

## 2013-04-27 NOTE — Telephone Encounter (Signed)
Left message for pt to call, he is okay to hold the pradaxa 48 hours prior to procedure.

## 2013-04-27 NOTE — Telephone Encounter (Signed)
Left message for pt to call.

## 2013-04-27 NOTE — Telephone Encounter (Signed)
Pt needs to have a tooth pulled, needs to go off pradaxa , however already been off two days because he ran out, but needs ok , pls call  pt [phone]

## 2013-04-27 NOTE — Telephone Encounter (Signed)
New Problem  Pt returned call about clearance.

## 2013-04-27 NOTE — Telephone Encounter (Signed)
Follow up ° ° °Pt returning your call °

## 2013-04-27 NOTE — Telephone Encounter (Signed)
Spoke with pt, he is going to try to get his tooth pulled tomorrow. He has been off the pradaxa for two to three days. He has not picked it up from the pharm. He was made aware and voiced understanding if his tooth is not taken care of tomorrow he will need to get back on the pradaxa. He is aware he will only need to hold the pradaxa for 48 hours for the tooth to be pulled.

## 2013-05-08 ENCOUNTER — Ambulatory Visit (INDEPENDENT_AMBULATORY_CARE_PROVIDER_SITE_OTHER): Payer: Medicaid Other | Admitting: Internal Medicine

## 2013-05-08 ENCOUNTER — Encounter: Payer: Self-pay | Admitting: Internal Medicine

## 2013-05-08 VITALS — BP 121/87 | HR 65 | Ht 71.0 in | Wt 281.4 lb

## 2013-05-08 DIAGNOSIS — R0789 Other chest pain: Secondary | ICD-10-CM

## 2013-05-08 DIAGNOSIS — I4891 Unspecified atrial fibrillation: Secondary | ICD-10-CM

## 2013-05-08 DIAGNOSIS — Z9861 Coronary angioplasty status: Secondary | ICD-10-CM

## 2013-05-08 DIAGNOSIS — Z01810 Encounter for preprocedural cardiovascular examination: Secondary | ICD-10-CM

## 2013-05-08 DIAGNOSIS — Z950 Presence of cardiac pacemaker: Secondary | ICD-10-CM

## 2013-05-08 DIAGNOSIS — I2510 Atherosclerotic heart disease of native coronary artery without angina pectoris: Secondary | ICD-10-CM | POA: Insufficient documentation

## 2013-05-08 DIAGNOSIS — I495 Sick sinus syndrome: Secondary | ICD-10-CM

## 2013-05-08 LAB — PACEMAKER DEVICE OBSERVATION
AL IMPEDENCE PM: 468 Ohm
BMOD-0003RA: 30
BRDY-0002RA: 50 {beats}/min

## 2013-05-08 NOTE — Patient Instructions (Addendum)
Your physician has requested that you have a lexiscan myoview. For further information please visit [email or web address] Please follow instruction sheet, as given.    Your physician wants you to follow-up in: one year with Dr. Graciela Husbands.  You will receive a reminder letter in the mail two months in advance. If you don't receive a letter, please call our office to schedule the follow-up appointment.   Your physician recommends that you continue on your current medications as directed. Please refer to the Current Medication list given to you today.

## 2013-05-08 NOTE — Assessment & Plan Note (Signed)
The patient's device was interrogated.  The information was reviewed. No changes were made in the programming.    

## 2013-05-08 NOTE — Assessment & Plan Note (Signed)
Recurrent chest pain. He is anticipating the surgery. It is nonambulatory will undertake Myoview scanning the risk stratification and assessment of left ventricular function

## 2013-05-08 NOTE — Assessment & Plan Note (Signed)
He continues to have about 20% atrial fibrillation. This has been largely asymptomatic. He continues on his dabigitran. Is no longer taking dofetilide.

## 2013-05-08 NOTE — Progress Notes (Signed)
Patient Care Team: Maryelizabeth Rowan, MD as PCP - General (Family Medicine) Mardella Layman, MD (Gastroenterology)   HPI  [name] is a 52 y.o. male Seen in followup for pacemaker implanted for sinus node dysfunction and paroxysmal atrial arrhythmias which persisted despite dofetilide. He underwent catheter ablation of atrial fibrillation 2012. He had been on dofetilide; this was stopped March 2014. There appears to been some confusion. His heart has been doing quite well. He has had very little symptomatic atrial fibrillation.  otherewise no change in functional status   He is anticipating back surgery in 2 weeks.  He's had recent problems with chest and neck discomfort. This is not related to exertion.   He has a history of ischemic heart disease with prior non-STEMI possibly type II as there was  no significant obstructive lesions noted. Catheterization was done in 2006 in 2000 and 90 showing nonobstructive disease. A Myoview scan 2011 was nonischemic. He also is a history of pulmonary embolism  Echocardiogram 2012 demonstrated mild LV dysfunction 45-50%   Past Medical History  Diagnosis Date  . GERD (gastroesophageal reflux disease)   . HTN (hypertension)   . Obesity   . CAD     S/P nstemi and cath 05/2008 - nonobs. dzs.  . Pulmonary embolism 04/2003    Hx of  . Atrial fibrillation     s/p afib ablation  PVI 5/12 JA  . Sinus node dysfunction     s/p MDT PPM  . Hyperlipemia   . Systolic and diastolic CHF, chronic     EF 35% by most recent echo 09/19/10  . PUD (peptic ulcer disease)     gastritis due to ETOH previously  . Hyperthyroidism     Graves dz on methimazole and followed by Dr Everardo All  . Polysubstance abuse     cocaine, last used 1999  . Pancreatitis   . Rectal bleeding   . Hemorrhoids   . Pacemaker-medtronic- [facility]     Dual chamber V port plugged   . Shortness of breath     LYING DOWN AT TIMES  . Sleep apnea     uses CPAP [facility] 2 YRS  . DDD  (degenerative disc disease)     evaluate by neurosurgery is ongoing  . Depression   . Erectile dysfunction     Past Surgical History  Procedure Laterality Date  . Pacemaker insertion  10/09    by Dr Graciela Husbands  . Fingers removed from right hand.  2/84    traumatic work injury  . Lumbar spine surgery  02/2009    Dr. Dutch Quint  . Atrial ablation surgery  12/2010    afib ablation by Dr Johney Frame  . Insert / replace / remove pacemaker      05/2008    . Cardiac catheterization      CADIAC ABLATION DR Johney Frame 12/2010  . Anterior cervical decomp/discectomy fusion  12/15/2011    Procedure: ANTERIOR CERVICAL DECOMPRESSION/DISCECTOMY FUSION 1 LEVEL;  Surgeon: Temple Pacini, MD;  Location: [facility];  Service: Neurosurgery;  Laterality: N/A;  Cervical Six-Seven Anterior Cervical Diskectomy fusion with Allograft and Plating    Current Outpatient Prescriptions  Medication Sig Dispense Refill  . Azelastine HCl (ASTELIN NA) Place into the nose.      . carvedilol (COREG) 25 MG tablet Take 25 mg by mouth 2 (two) times daily with a meal.       . dabigatran (PRADAXA) 150 MG CAPS Take 1 capsule (150 mg  total) by mouth every 12 (twelve) hours.  60 capsule  10  . dofetilide (TIKOSYN) 500 MCG capsule Take 500 mcg by mouth 2 (two) times daily.      . fluticasone (FLONASE) 50 MCG/ACT nasal spray Place 2 sprays into the nose daily.      [name] gabapentin (NEURONTIN) 100 MG capsule Take 100 mg by mouth daily.      [name] loratadine (CLARITIN) 10 MG tablet Take 10 mg by mouth daily.      [name] losartan (COZAAR) 50 MG tablet Take 1 tablet (50 mg total) by mouth daily.  30 tablet  3  . methimazole (TAPAZOLE) 10 MG tablet Take 1 tablet (10 mg total) by mouth daily.  30 tablet  2  . montelukast (SINGULAIR) 10 MG tablet Take 10 mg by mouth at bedtime.      . nitroGLYCERIN (NITROSTAT) 0.4 MG SL tablet Place 0.4 mg under the tongue every 5 (five) minutes as needed. For chest pain      . omeprazole (PRILOSEC) 40 MG capsule Take 40 mg by mouth  daily.       [name] oxyCODONE-acetaminophen (PERCOCET) 5-325 MG per tablet Take 1-2 tablets by mouth every 6 (six) hours as needed. For pain.      . polyethylene glycol (MIRALAX / GLYCOLAX) packet Take 17 g by mouth daily.        . potassium chloride SA (KLOR-CON M20) 20 MEQ tablet TAKE 1 TABLET BY MOUTH EVERY DAY  30 tablet  5  . sildenafil (VIAGRA) 100 MG tablet Take 100 mg by mouth daily as needed. For erectile dysfunction      . simvastatin (ZOCOR) 40 MG tablet TAKE 1 TABLET (40 MG TOTAL) BY MOUTH DAILY.  30 tablet  5  . [DISCONTINUED] vardenafil (LEVITRA) 20 MG tablet Take 1 tablet (20 mg total) by mouth daily as needed for erectile dysfunction.  20 tablet  3   No current facility-administered medications for this visit.    Allergies  Allergen Reactions  . Celebrex [Celecoxib] Itching  . Hydrocodone Itching  . Prednisolone Itching    Review of Systems negative except from HPI and PMH  Physical Exam BP 121/87  Pulse 65  Ht 5\' 11"  (1.803 m)  Wt 281 lb 6.4 oz (127.642 kg)  BMI 39.26 kg/m2 Well developed and well nourished in no acute distress HENT normal E scleral and icterus clear Neck Supple JVP flat; carotids brisk and full Clear to ausculation  Regular rate and rhythm, no murmurs gallops or rub Soft with active bowel sounds No clubbing cyanosis none Edema Alert and oriented, grossly normal motor and sensory function Skin Warm and Dry  ECG shows sinus rhythm at 65 Interval 17/11/46 Frequent PACs  Assessment and  Plan

## 2013-05-08 NOTE — Assessment & Plan Note (Signed)
Atrial pacing 22% reasonable heart rate excursion

## 2013-05-08 NOTE — Assessment & Plan Note (Signed)
As above.

## 2013-05-08 NOTE — Assessment & Plan Note (Signed)
As above With chest pain and major surgery undertake the getting. This is unchanged with anticipate acceptable risk. Anticipate that he would continue his beta blockers

## 2013-05-08 NOTE — Assessment & Plan Note (Signed)
Is euvolemic. We'll continue his current medications.

## 2013-05-08 NOTE — Assessment & Plan Note (Signed)
" >>  ASSESSMENT AND PLAN FOR COMBINED HEART FAILURE, CHRONIC WRITTEN ON 05/08/2013  9:16 AM BY Indie Boehne C, MD  Is euvolemic. We'll continue his current medications. "

## 2013-05-15 NOTE — Pre-Procedure Instructions (Addendum)
SAMY RYNER  05/15/2013   Your procedure is scheduled on:  Tuesday, Sept. 23rd    Report to [facility] at 5:30 AM.             Bonita Quin will come through Entrance "A")   Call this number if you have problems the morning of surgery: [phone]   Remember:   Do not eat food or drink liquids after midnight Monday.   Take these medicines the morning of surgery with A SIP OF WATER: Coreg, gabapentin,singulair, Omeprazole, pain med if needed     STOP   Ibuprofen,    predaxa per dr   Drucilla Schmidt not wear jewelry.  Do not wear lotions, powders, or colognes. You may NOT wear deodorant.               Men may shave face and neck.  Do not bring valuables to the hospital.  [facility] is not responsible for any belongings or valuables.   Contacts, dentures or bridgework may not be worn into surgery.  Leave suitcase in the car. After surgery it may be brought to your room.  For patients admitted to the hospital, checkout time is 11:00 AM the day of discharge.   Name and phone number of your driver:    Special Instructions: Shower using CHG 2 nights before surgery and the night before surgery.  If you shower the day of surgery use CHG.  Use special wash - you have one bottle of CHG for all showers.  You should use approximately 1/3 of the bottle for each shower.   Please read over the following fact sheets that you were given: Pain Booklet, Coughing and Deep Breathing, Blood Transfusion Information, MRSA Information and Surgical Site Infection Prevention

## 2013-05-16 ENCOUNTER — Encounter (HOSPITAL_COMMUNITY): Payer: Self-pay

## 2013-05-16 ENCOUNTER — Encounter: Payer: Self-pay | Admitting: Internal Medicine

## 2013-05-16 ENCOUNTER — Encounter (HOSPITAL_COMMUNITY): Payer: Medicaid Other

## 2013-05-16 ENCOUNTER — Encounter (HOSPITAL_COMMUNITY)
Admission: RE | Admit: 2013-05-16 | Discharge: 2013-05-16 | Disposition: A | Discharge status: Home / Self Care | Payer: Medicaid Other | Source: Ambulatory Visit | Attending: Anesthesiology | Admitting: Anesthesiology

## 2013-05-16 LAB — CBC WITH DIFFERENTIAL/PLATELET
Basophils Absolute: 0 10*3/uL (ref 0.0–0.1)
Eosinophils Relative: 5 % (ref 0–5)
HCT: 42.9 % (ref 39.0–52.0)
Hemoglobin: 14.7 g/dL (ref 13.0–17.0)
Lymphocytes Relative: 45 % (ref 12–46)
MCHC: 34.3 g/dL (ref 30.0–36.0)
MCV: 79.3 fL (ref 78.0–100.0)
Monocytes Absolute: 0.5 10*3/uL (ref 0.1–1.0)
Monocytes Relative: 11 % (ref 3–12)
Neutro Abs: 1.8 10*3/uL (ref 1.7–7.7)
RDW: 16.2 % — ABNORMAL HIGH (ref 11.5–15.5)
WBC: 4.7 10*3/uL (ref 4.0–10.5)

## 2013-05-16 LAB — BASIC METABOLIC PANEL
BUN: 18 mg/dL (ref 6–23)
CO2: 24 mEq/L (ref 19–32)
Chloride: 103 mEq/L (ref 96–112)
Creatinine, Ser: 1.08 mg/dL (ref 0.50–1.35)
Potassium: 4.2 mEq/L (ref 3.5–5.1)

## 2013-05-16 LAB — TYPE AND SCREEN

## 2013-05-16 NOTE — Progress Notes (Addendum)
Pacemaker order faxed to dr Graciela Husbands Patient to have new stress test today. Called [employer] to contact rep re: reprogram pacer am of surgery

## 2013-05-17 ENCOUNTER — Ambulatory Visit (HOSPITAL_BASED_OUTPATIENT_CLINIC_OR_DEPARTMENT_OTHER): Payer: Medicaid Other | Admitting: Radiology

## 2013-05-17 ENCOUNTER — Encounter (HOSPITAL_COMMUNITY)
Admission: RE | Admit: 2013-05-17 | Discharge: 2013-05-17 | Disposition: A | Discharge status: Home / Self Care | Payer: Medicaid Other | Source: Ambulatory Visit | Attending: Neurosurgery | Admitting: Neurosurgery

## 2013-05-17 VITALS — Ht 71.0 in | Wt 281.0 lb

## 2013-05-17 DIAGNOSIS — R079 Chest pain, unspecified: Secondary | ICD-10-CM

## 2013-05-17 DIAGNOSIS — R0602 Shortness of breath: Secondary | ICD-10-CM

## 2013-05-17 DIAGNOSIS — Z01818 Encounter for other preprocedural examination: Secondary | ICD-10-CM | POA: Insufficient documentation

## 2013-05-17 DIAGNOSIS — Z01810 Encounter for preprocedural cardiovascular examination: Secondary | ICD-10-CM | POA: Insufficient documentation

## 2013-05-17 DIAGNOSIS — Z01812 Encounter for preprocedural laboratory examination: Secondary | ICD-10-CM | POA: Insufficient documentation

## 2013-05-17 DIAGNOSIS — I4891 Unspecified atrial fibrillation: Secondary | ICD-10-CM

## 2013-05-17 HISTORY — DX: Other complications of anesthesia, initial encounter: T88.59XA

## 2013-05-17 HISTORY — DX: Headache: R51

## 2013-05-17 HISTORY — DX: Anodontia: K00.0

## 2013-05-17 MED ORDER — AMINOPHYLLINE 25 MG/ML IV SOLN
75.0000 mg | Freq: Once | INTRAVENOUS | Status: AC
  Administered 2013-05-17: 75 mg via INTRAVENOUS

## 2013-05-17 MED ORDER — REGADENOSON 0.4 MG/5ML IV SOLN
0.4000 mg | Freq: Once | INTRAVENOUS | Status: AC
  Administered 2013-05-17: 0.4 mg via INTRAVENOUS

## 2013-05-17 MED ORDER — TECHNETIUM TC 99M SESTAMIBI GENERIC - CARDIOLITE
11.0000 | Freq: Once | INTRAVENOUS | Status: AC | PRN
  Administered 2013-05-17: 11 via INTRAVENOUS

## 2013-05-17 MED ORDER — TECHNETIUM TC 99M SESTAMIBI GENERIC - CARDIOLITE
33.0000 | Freq: Once | INTRAVENOUS | Status: AC | PRN
  Administered 2013-05-17: 33 via INTRAVENOUS

## 2013-05-17 NOTE — Progress Notes (Signed)
[facility] SITE 3 NUCLEAR MED [address] [place], [place] [address] [phone]    Cardiology Nuclear Med Study  [name] is a 52 y.o. male     MRN : [id number]     DOB: April 11, 1961  Procedure Date: 05/17/2013  Nuclear Med Background Indication for Stress Test:  Evaluation for Ischemia, and Surgical Clearance for  Back Surgery on 05-23-13 by Julio Sicks, MD History:  AFIB, CHF, '09 MI-possible Type II, Heart Cath: N/O CAD, 2011 MPS: (-) ischemia '12 Ablation, 07/15/11 ECHO: 45-50% Cardiac Risk Factors: History of Smoking, Hypertension and Lipids  Symptoms:  Chest Pain, DOE, Fatigue, Palpitations and SOB   Nuclear Pre-Procedure Caffeine/Decaff Intake:  None > 12 hrs NPO After: 10:30pm   Lungs:  clear O2 Sat: 95% on room air. IV 0.9% NS with Angio Cath:  20g  IV Site: R Antecubital x 1, tolerated well IV Started by:  Irean Hong, RN  Chest Size (in):  54 Cup Size: n/a  Height: 5\' 11"  (1.803 m)  Weight:  281 lb (127.461 kg)  BMI:  Body mass index is 39.21 kg/(m^2). Tech Comments:  Coreg at 0700 today. Aminophylline 75 mg given for symptoms with the Lexiscan injection. All symptoms were resolved.    Nuclear Med Study 1 or 2 day study: 1 day  Stress Test Type:  Treadmill/Lexiscan  Reading MD: Willa Rough, MD  Order Authorizing Provider:  Sherryl Manges, MD  Resting Radionuclide: Technetium 71m Sestamibi  Resting Radionuclide Dose: 11.0 mCi   Stress Radionuclide:  Technetium 30m Sestamibi  Stress Radionuclide Dose: 33.0 mCi           Stress Protocol Rest HR: 50 Stress HR: 72  Rest BP: 115/75 Stress BP: 130/82  Exercise Time (min): n/a METS: n/a   Predicted Max HR: 167 bpm % Max HR: 43.11 bpm Rate Pressure Product: 9360   Dose of Adenosine (mg):  n/a Dose of Lexiscan: 0.4 mg  Dose of Atropine (mg): n/a Dose of Dobutamine: n/a mcg/kg/min (at max HR)  Stress Test Technologist: Milana Na, EMT-P  Nuclear Technologist:  Harlow Asa, CNMT      Rest Procedure:  Myocardial perfusion imaging was performed at rest 45 minutes following the intravenous administration of Technetium 25m Sestamibi. Rest ECG: Normal sinus rhythm. Nonspecific T wave changes.  Stress Procedure:  The patient received IV Lexiscan 0.4 mg over 15-seconds.  Technetium 95m Sestamibi injected at 30-seconds. This patient has sob, abdominal pain and dizziness with the Lexiscan injection. Quantitative spect images were obtained after a 45 minute delay. Stress ECG: No significant change from baseline ECG  QPS Raw Data Images:  Normal; no motion artifact; normal heart/lung ratio. Stress Images:  Medium-sized area of moderate decreased activity in the inferior wall. This is fixed Rest Images:  Medium size area of moderate decreased activity in the inferior wall Subtraction (SDS):  No ischemia Transient Ischemic Dilatation (Normal <1.22):  n/a Lung/Heart Ratio (Normal <0.45):  0.49  Quantitative Gated Spect Images QGS EDV:  207 ml QGS ESV:  120 ml  Impression Exercise Capacity:  Lexiscan with low level exercise. BP Response:  Normal blood pressure response. Clinical Symptoms:  Shortness of breath ECG Impression:  No significant ST segment change suggestive of ischemia. Comparison with Prior Nuclear Study: Study is compared with the report of the study from 2011  Overall Impression:  The patient had mild decreased ejection fraction on the study in 2011. Currently there is decreased activity in the inferior wall on the  images. This is not supported significantly by the quantitative analysis. The raw data shows that there is interference from nuclear activity from structures below the diaphragm. This may be affecting the tomographic images. Overall there is no definite significant ischemia. I cannot rule out the possibility of some inferior scar but I'm not convinced that this is real. This is a low risk scan.  LV Ejection Fraction: 42%.  LV Wall Motion:  Very slight  global hypokinesis  Willa Rough, MD

## 2013-05-18 ENCOUNTER — Encounter: Payer: Self-pay | Admitting: *Deleted

## 2013-05-18 NOTE — Progress Notes (Addendum)
Spoke with [employer] number and information given for this pt's surgery date/time and need for rep to call us back.  Leta Jungling from [place] called back and confirmed information needed for patients upcoming surgery.

## 2013-05-18 NOTE — Progress Notes (Signed)
Faxed clearance to [facility] for surgery clearance per Dr. Graciela Husbands

## 2013-05-22 NOTE — Progress Notes (Signed)
Notified patient of time change, instructed patient to arrive at 715 am 05/23/13.

## 2013-05-22 NOTE — Progress Notes (Signed)
Notified Leta Jungling with [employer] of surgery time being changed to 0925 on 05/23/13.

## 2013-05-23 ENCOUNTER — Inpatient Hospital Stay (HOSPITAL_COMMUNITY): Payer: Medicaid Other | Admitting: Critical Care Medicine

## 2013-05-23 ENCOUNTER — Inpatient Hospital Stay (HOSPITAL_COMMUNITY)
Admission: RE | Admit: 2013-05-23 | Discharge: 2013-05-26 | DRG: 460 | Disposition: A | Discharge status: Home / Self Care | Payer: Medicaid Other | Source: Ambulatory Visit | Attending: Neurosurgery | Admitting: Neurosurgery

## 2013-05-23 ENCOUNTER — Encounter (HOSPITAL_COMMUNITY)
Admission: RE | Disposition: A | Discharge status: Home / Self Care | Payer: Medicaid Other | Source: Ambulatory Visit | Attending: Neurosurgery

## 2013-05-23 ENCOUNTER — Encounter (HOSPITAL_COMMUNITY): Payer: Self-pay | Admitting: Critical Care Medicine

## 2013-05-23 ENCOUNTER — Encounter (HOSPITAL_COMMUNITY): Payer: Self-pay | Admitting: Surgery

## 2013-05-23 ENCOUNTER — Inpatient Hospital Stay (HOSPITAL_COMMUNITY): Payer: Medicaid Other

## 2013-05-23 DIAGNOSIS — Z79899 Other long term (current) drug therapy: Secondary | ICD-10-CM

## 2013-05-23 DIAGNOSIS — Z981 Arthrodesis status: Secondary | ICD-10-CM

## 2013-05-23 DIAGNOSIS — I252 Old myocardial infarction: Secondary | ICD-10-CM

## 2013-05-23 DIAGNOSIS — G4730 Sleep apnea, unspecified: Secondary | ICD-10-CM | POA: Diagnosis present

## 2013-05-23 DIAGNOSIS — I10 Essential (primary) hypertension: Secondary | ICD-10-CM | POA: Diagnosis present

## 2013-05-23 DIAGNOSIS — M48062 Spinal stenosis, lumbar region with neurogenic claudication: Principal | ICD-10-CM | POA: Diagnosis present

## 2013-05-23 DIAGNOSIS — I5042 Chronic combined systolic (congestive) and diastolic (congestive) heart failure: Secondary | ICD-10-CM | POA: Diagnosis present

## 2013-05-23 DIAGNOSIS — E669 Obesity, unspecified: Secondary | ICD-10-CM | POA: Diagnosis present

## 2013-05-23 DIAGNOSIS — E0590 Thyrotoxicosis, unspecified without thyrotoxic crisis or storm: Secondary | ICD-10-CM | POA: Diagnosis present

## 2013-05-23 DIAGNOSIS — Z87891 Personal history of nicotine dependence: Secondary | ICD-10-CM

## 2013-05-23 DIAGNOSIS — Z950 Presence of cardiac pacemaker: Secondary | ICD-10-CM

## 2013-05-23 DIAGNOSIS — Z86711 Personal history of pulmonary embolism: Secondary | ICD-10-CM

## 2013-05-23 DIAGNOSIS — Z23 Encounter for immunization: Secondary | ICD-10-CM

## 2013-05-23 DIAGNOSIS — I2510 Atherosclerotic heart disease of native coronary artery without angina pectoris: Secondary | ICD-10-CM | POA: Diagnosis present

## 2013-05-23 DIAGNOSIS — E785 Hyperlipidemia, unspecified: Secondary | ICD-10-CM | POA: Diagnosis present

## 2013-05-23 DIAGNOSIS — I509 Heart failure, unspecified: Secondary | ICD-10-CM | POA: Diagnosis present

## 2013-05-23 DIAGNOSIS — I4891 Unspecified atrial fibrillation: Secondary | ICD-10-CM | POA: Diagnosis present

## 2013-05-23 DIAGNOSIS — K219 Gastro-esophageal reflux disease without esophagitis: Secondary | ICD-10-CM | POA: Diagnosis present

## 2013-05-23 SURGERY — POSTERIOR LUMBAR FUSION 1 LEVEL
Anesthesia: General | Site: Back | Wound class: Contaminated

## 2013-05-23 MED ORDER — NITROGLYCERIN 0.4 MG SL SUBL: 0.4000 mg | SUBLINGUAL_TABLET | SUBLINGUAL | Status: DC | PRN

## 2013-05-23 MED ORDER — ACETAMINOPHEN 650 MG RE SUPP: 650.0000 mg | RECTAL | Status: DC | PRN

## 2013-05-23 MED ORDER — SODIUM CHLORIDE 0.9 % IR SOLN
Status: DC | PRN
  Administered 2013-05-23: 10:00:00

## 2013-05-23 MED ORDER — POTASSIUM CHLORIDE CRYS ER 20 MEQ PO TBCR
20.0000 meq | EXTENDED_RELEASE_TABLET | Freq: Every day | ORAL | Status: DC
  Administered 2013-05-23 – 2013-05-26 (×4): 20 meq via ORAL
  Filled 2013-05-23 (×4): qty 1

## 2013-05-23 MED ORDER — ALUM & MAG HYDROXIDE-SIMETH 200-200-20 MG/5ML PO SUSP: 30.0000 mL | Freq: Four times a day (QID) | ORAL | Status: DC | PRN

## 2013-05-23 MED ORDER — FLUTICASONE PROPIONATE 50 MCG/ACT NA SUSP
2.0000 | Freq: Every day | NASAL | Status: DC
  Filled 2013-05-23: qty 16

## 2013-05-23 MED ORDER — HYDROMORPHONE HCL PF 1 MG/ML IJ SOLN
INTRAMUSCULAR | Status: AC
  Administered 2013-05-23: 1 mg
  Filled 2013-05-23: qty 1

## 2013-05-23 MED ORDER — SODIUM CHLORIDE 0.9 % IV SOLN: 250.0000 mL | INTRAVENOUS | Status: DC

## 2013-05-23 MED ORDER — DIAZEPAM 5 MG PO TABS
5.0000 mg | ORAL_TABLET | Freq: Four times a day (QID) | ORAL | Status: DC | PRN
  Administered 2013-05-23 – 2013-05-26 (×8): 5 mg via ORAL
  Filled 2013-05-23 (×8): qty 1

## 2013-05-23 MED ORDER — DEXTROSE 5 % IV SOLN
3.0000 g | INTRAVENOUS | Status: AC
  Administered 2013-05-23: 3 g via INTRAVENOUS
  Filled 2013-05-23 (×2): qty 3000

## 2013-05-23 MED ORDER — ZOLPIDEM TARTRATE 5 MG PO TABS: 5.0000 mg | ORAL_TABLET | Freq: Every evening | ORAL | Status: DC | PRN

## 2013-05-23 MED ORDER — MIDAZOLAM HCL 5 MG/5ML IJ SOLN
INTRAMUSCULAR | Status: DC | PRN
  Administered 2013-05-23: 2 mg via INTRAVENOUS

## 2013-05-23 MED ORDER — GLYCOPYRROLATE 0.2 MG/ML IJ SOLN
INTRAMUSCULAR | Status: DC | PRN
  Administered 2013-05-23: 0.6 mg via INTRAVENOUS

## 2013-05-23 MED ORDER — HYDROMORPHONE HCL PF 1 MG/ML IJ SOLN
INTRAMUSCULAR | Status: AC
  Filled 2013-05-23: qty 1

## 2013-05-23 MED ORDER — HYDROCODONE-ACETAMINOPHEN 5-325 MG PO TABS: 1.0000 | ORAL_TABLET | ORAL | Status: DC | PRN

## 2013-05-23 MED ORDER — NEOSTIGMINE METHYLSULFATE 1 MG/ML IJ SOLN
INTRAMUSCULAR | Status: DC | PRN
  Administered 2013-05-23: 4 mg via INTRAVENOUS

## 2013-05-23 MED ORDER — GABAPENTIN 100 MG PO CAPS
100.0000 mg | ORAL_CAPSULE | Freq: Every day | ORAL | Status: DC
  Administered 2013-05-24 – 2013-05-26 (×3): 100 mg via ORAL
  Filled 2013-05-23 (×3): qty 1

## 2013-05-23 MED ORDER — CARVEDILOL 25 MG PO TABS
25.0000 mg | ORAL_TABLET | Freq: Two times a day (BID) | ORAL | Status: DC
  Administered 2013-05-23 – 2013-05-26 (×6): 25 mg via ORAL
  Filled 2013-05-23 (×8): qty 1

## 2013-05-23 MED ORDER — PROMETHAZINE HCL 25 MG/ML IJ SOLN: 6.2500 mg | INTRAMUSCULAR | Status: DC | PRN

## 2013-05-23 MED ORDER — ARTIFICIAL TEARS OP OINT
TOPICAL_OINTMENT | OPHTHALMIC | Status: DC | PRN
  Administered 2013-05-23: 1 via OPHTHALMIC

## 2013-05-23 MED ORDER — SODIUM CHLORIDE 0.9 % IJ SOLN
3.0000 mL | Freq: Two times a day (BID) | INTRAMUSCULAR | Status: DC
  Administered 2013-05-23 – 2013-05-25 (×5): 3 mL via INTRAVENOUS

## 2013-05-23 MED ORDER — CHLORHEXIDINE GLUCONATE 0.12 % MT SOLN
15.0000 mL | Freq: Two times a day (BID) | OROMUCOSAL | Status: DC
  Administered 2013-05-23 – 2013-05-26 (×6): 15 mL via OROMUCOSAL
  Filled 2013-05-23 (×7): qty 15

## 2013-05-23 MED ORDER — PHENOL 1.4 % MT LIQD: 1.0000 | OROMUCOSAL | Status: DC | PRN

## 2013-05-23 MED ORDER — AZELASTINE HCL 0.1 % NA SOLN
1.0000 | Freq: Two times a day (BID) | NASAL | Status: DC
  Filled 2013-05-23: qty 30

## 2013-05-23 MED ORDER — LIDOCAINE HCL (CARDIAC) 20 MG/ML IV SOLN
INTRAVENOUS | Status: DC | PRN
  Administered 2013-05-23: 80 mg via INTRAVENOUS

## 2013-05-23 MED ORDER — DIAZEPAM 5 MG PO TABS
ORAL_TABLET | ORAL | Status: AC
  Filled 2013-05-23: qty 1

## 2013-05-23 MED ORDER — FENTANYL CITRATE 0.05 MG/ML IJ SOLN
INTRAMUSCULAR | Status: DC | PRN
  Administered 2013-05-23: 150 ug via INTRAVENOUS
  Administered 2013-05-23 (×2): 50 ug via INTRAVENOUS

## 2013-05-23 MED ORDER — OXYCODONE-ACETAMINOPHEN 5-325 MG PO TABS
1.0000 | ORAL_TABLET | ORAL | Status: DC | PRN
  Administered 2013-05-23 (×2): 1 via ORAL
  Administered 2013-05-23 – 2013-05-24 (×4): 2 via ORAL
  Administered 2013-05-24: 1 via ORAL
  Administered 2013-05-25 – 2013-05-26 (×5): 2 via ORAL
  Filled 2013-05-23 (×10): qty 2

## 2013-05-23 MED ORDER — ONDANSETRON HCL 4 MG/2ML IJ SOLN: 4.0000 mg | INTRAMUSCULAR | Status: DC | PRN

## 2013-05-23 MED ORDER — LACTATED RINGERS IV SOLN
INTRAVENOUS | Status: DC
  Administered 2013-05-23 (×2): via INTRAVENOUS

## 2013-05-23 MED ORDER — FLEET ENEMA 7-19 GM/118ML RE ENEM
1.0000 | ENEMA | Freq: Once | RECTAL | Status: AC | PRN
  Filled 2013-05-23: qty 1

## 2013-05-23 MED ORDER — PROPOFOL 10 MG/ML IV BOLUS
INTRAVENOUS | Status: DC | PRN
  Administered 2013-05-23: 170 mg via INTRAVENOUS

## 2013-05-23 MED ORDER — POLYETHYLENE GLYCOL 3350 17 G PO PACK
17.0000 g | PACK | Freq: Every day | ORAL | Status: DC | PRN
  Filled 2013-05-23: qty 1

## 2013-05-23 MED ORDER — ACETAMINOPHEN 325 MG PO TABS
650.0000 mg | ORAL_TABLET | ORAL | Status: DC | PRN
  Administered 2013-05-25: 650 mg via ORAL
  Filled 2013-05-23: qty 2

## 2013-05-23 MED ORDER — LORATADINE 10 MG PO TABS
10.0000 mg | ORAL_TABLET | Freq: Every day | ORAL | Status: DC
Start: 2013-05-24 — End: 2013-05-26
  Administered 2013-05-24 – 2013-05-26 (×3): 10 mg via ORAL
  Filled 2013-05-23 (×3): qty 1

## 2013-05-23 MED ORDER — PANTOPRAZOLE SODIUM 40 MG PO TBEC
40.0000 mg | DELAYED_RELEASE_TABLET | Freq: Every day | ORAL | Status: DC
  Administered 2013-05-23 – 2013-05-26 (×4): 40 mg via ORAL
  Filled 2013-05-23 (×4): qty 1

## 2013-05-23 MED ORDER — MENTHOL 3 MG MT LOZG
1.0000 | LOZENGE | OROMUCOSAL | Status: DC | PRN
  Administered 2013-05-24: 3 mg via ORAL
  Filled 2013-05-23: qty 9

## 2013-05-23 MED ORDER — OXYCODONE-ACETAMINOPHEN 5-325 MG PO TABS
ORAL_TABLET | ORAL | Status: AC
  Filled 2013-05-23: qty 2

## 2013-05-23 MED ORDER — DEXAMETHASONE SODIUM PHOSPHATE 4 MG/ML IJ SOLN
INTRAMUSCULAR | Status: DC | PRN
  Administered 2013-05-23: 4 mg via INTRAVENOUS

## 2013-05-23 MED ORDER — HYDROMORPHONE HCL PF 1 MG/ML IJ SOLN
0.2500 mg | INTRAMUSCULAR | Status: DC | PRN
  Administered 2013-05-23 (×4): 0.5 mg via INTRAVENOUS

## 2013-05-23 MED ORDER — THROMBIN 20000 UNITS EX SOLR
CUTANEOUS | Status: DC | PRN
  Administered 2013-05-23: 10:00:00 via TOPICAL

## 2013-05-23 MED ORDER — INFLUENZA VAC SPLIT QUAD 0.5 ML IM SUSP
0.5000 mL | INTRAMUSCULAR | Status: AC
  Administered 2013-05-24: 0.5 mL via INTRAMUSCULAR
  Filled 2013-05-23 (×2): qty 0.5

## 2013-05-23 MED ORDER — CEFAZOLIN SODIUM 1-5 GM-% IV SOLN
1.0000 g | Freq: Three times a day (TID) | INTRAVENOUS | Status: AC
  Administered 2013-05-23 – 2013-05-24 (×2): 1 g via INTRAVENOUS
  Filled 2013-05-23 (×2): qty 50

## 2013-05-23 MED ORDER — SIMVASTATIN 40 MG PO TABS
40.0000 mg | ORAL_TABLET | Freq: Every evening | ORAL | Status: DC
  Administered 2013-05-23 – 2013-05-25 (×3): 40 mg via ORAL
  Filled 2013-05-23 (×4): qty 1

## 2013-05-23 MED ORDER — BUPIVACAINE HCL (PF) 0.25 % IJ SOLN
INTRAMUSCULAR | Status: DC | PRN
  Administered 2013-05-23: 3 mL

## 2013-05-23 MED ORDER — METHIMAZOLE 10 MG PO TABS
10.0000 mg | ORAL_TABLET | Freq: Every day | ORAL | Status: DC
  Administered 2013-05-23 – 2013-05-26 (×4): 10 mg via ORAL
  Filled 2013-05-23 (×4): qty 1

## 2013-05-23 MED ORDER — SODIUM CHLORIDE 0.9 % IJ SOLN: 3.0000 mL | INTRAMUSCULAR | Status: DC | PRN

## 2013-05-23 MED ORDER — ROCURONIUM BROMIDE 100 MG/10ML IV SOLN
INTRAVENOUS | Status: DC | PRN
  Administered 2013-05-23: 50 mg via INTRAVENOUS
  Administered 2013-05-23: 10 mg via INTRAVENOUS
  Administered 2013-05-23: 20 mg via INTRAVENOUS

## 2013-05-23 MED ORDER — SENNA 8.6 MG PO TABS
1.0000 | ORAL_TABLET | Freq: Two times a day (BID) | ORAL | Status: DC
  Administered 2013-05-24 – 2013-05-26 (×4): 8.6 mg via ORAL
  Filled 2013-05-23 (×6): qty 1

## 2013-05-23 MED ORDER — LOSARTAN POTASSIUM 50 MG PO TABS
50.0000 mg | ORAL_TABLET | Freq: Every day | ORAL | Status: DC
  Administered 2013-05-23 – 2013-05-26 (×4): 50 mg via ORAL
  Filled 2013-05-23 (×4): qty 1

## 2013-05-23 MED ORDER — HYDROMORPHONE HCL PF 1 MG/ML IJ SOLN: 0.5000 mg | INTRAMUSCULAR | Status: DC | PRN

## 2013-05-23 MED ORDER — MONTELUKAST SODIUM 10 MG PO TABS
10.0000 mg | ORAL_TABLET | Freq: Every day | ORAL | Status: DC
  Administered 2013-05-24 – 2013-05-25 (×2): 10 mg via ORAL
  Filled 2013-05-23 (×3): qty 1

## 2013-05-23 MED ORDER — BISACODYL 10 MG RE SUPP: 10.0000 mg | Freq: Every day | RECTAL | Status: DC | PRN

## 2013-05-23 MED ORDER — 0.9 % SODIUM CHLORIDE (POUR BTL) OPTIME
TOPICAL | Status: DC | PRN
  Administered 2013-05-23: 1000 mL

## 2013-05-23 MED ORDER — ONDANSETRON HCL 4 MG/2ML IJ SOLN
INTRAMUSCULAR | Status: DC | PRN
  Administered 2013-05-23 (×2): 4 mg via INTRAVENOUS

## 2013-05-23 MED ORDER — TESTOSTERONE 50 MG/5GM (1%) TD GEL
5.0000 g | Freq: Every day | TRANSDERMAL | Status: DC
  Filled 2013-05-23: qty 5

## 2013-05-23 SURGICAL SUPPLY — 72 items
ADH SKN CLS APL DERMABOND .7 (GAUZE/BANDAGES/DRESSINGS) ×1
ADH SKN CLS LQ APL DERMABOND (GAUZE/BANDAGES/DRESSINGS) ×1
APL SKNCLS STERI-STRIP NONHPOA (GAUZE/BANDAGES/DRESSINGS) ×1
BAG DECANTER FOR FLEXI CONT (MISCELLANEOUS) ×2 IMPLANT
BENZOIN TINCTURE PRP APPL 2/3 (GAUZE/BANDAGES/DRESSINGS) ×2 IMPLANT
BLADE SURG ROTATE 9660 (MISCELLANEOUS) IMPLANT
BRUSH SCRUB EZ PLAIN DRY (MISCELLANEOUS) ×2 IMPLANT
BUR MATCHSTICK NEURO 3.0 LAGG (BURR) ×2 IMPLANT
CANISTER SUCTION 2500CC (MISCELLANEOUS) ×2 IMPLANT
CAP LCK SPNE (Orthopedic Implant) ×4 IMPLANT
CAP LOCK SPINE RADIUS (Orthopedic Implant) IMPLANT
CAP LOCKING (Orthopedic Implant) ×8 IMPLANT
CLOTH BEACON ORANGE TIMEOUT ST (SAFETY) ×2 IMPLANT
CONT SPEC 4OZ CLIKSEAL STRL BL (MISCELLANEOUS) ×4 IMPLANT
COVER BACK TABLE 24X17X13 BIG (DRAPES) IMPLANT
COVER TABLE BACK 60X90 (DRAPES) ×2 IMPLANT
DECANTER SPIKE VIAL GLASS SM (MISCELLANEOUS) ×2 IMPLANT
DERMABOND ADHESIVE PROPEN (GAUZE/BANDAGES/DRESSINGS) ×1
DERMABOND ADVANCED (GAUZE/BANDAGES/DRESSINGS) ×1
DERMABOND ADVANCED .7 DNX12 (GAUZE/BANDAGES/DRESSINGS) ×1 IMPLANT
DERMABOND ADVANCED .7 DNX6 (GAUZE/BANDAGES/DRESSINGS) IMPLANT
DRAPE C-ARM 42X72 X-RAY (DRAPES) ×4 IMPLANT
DRAPE LAPAROTOMY 100X72X124 (DRAPES) ×2 IMPLANT
DRAPE POUCH INSTRU U-SHP 10X18 (DRAPES) ×2 IMPLANT
DRAPE PROXIMA HALF (DRAPES) IMPLANT
DRAPE SURG 17X23 STRL (DRAPES) ×8 IMPLANT
DRSG OPSITE POSTOP 4X10 (GAUZE/BANDAGES/DRESSINGS) ×1 IMPLANT
DURAPREP 26ML APPLICATOR (WOUND CARE) ×2 IMPLANT
ELECT REM PT RETURN 9FT ADLT (ELECTROSURGICAL) ×2
ELECTRODE REM PT RTRN 9FT ADLT (ELECTROSURGICAL) ×1 IMPLANT
EVACUATOR 1/8 PVC DRAIN (DRAIN) ×2 IMPLANT
GAUZE SPONGE 4X4 16PLY XRAY LF (GAUZE/BANDAGES/DRESSINGS) IMPLANT
GLOVE BIO SURGEON STRL SZ8 (GLOVE) ×1 IMPLANT
GLOVE BIOGEL PI IND STRL 8.5 (GLOVE) IMPLANT
GLOVE BIOGEL PI INDICATOR 8.5 (GLOVE) ×1
GLOVE ECLIPSE 8.5 STRL (GLOVE) ×4 IMPLANT
GLOVE EXAM NITRILE LRG STRL (GLOVE) IMPLANT
GLOVE EXAM NITRILE MD LF STRL (GLOVE) IMPLANT
GLOVE EXAM NITRILE XL STR (GLOVE) IMPLANT
GLOVE EXAM NITRILE XS STR PU (GLOVE) IMPLANT
GLOVE INDICATOR 7.0 STRL GRN (GLOVE) ×1 IMPLANT
GLOVE SS BIOGEL STRL SZ 6.5 (GLOVE) IMPLANT
GLOVE SUPERSENSE BIOGEL SZ 6.5 (GLOVE) ×3
GOWN BRE IMP SLV AUR LG STRL (GOWN DISPOSABLE) IMPLANT
GOWN BRE IMP SLV AUR XL STRL (GOWN DISPOSABLE) ×4 IMPLANT
GOWN STRL REIN 2XL LVL4 (GOWN DISPOSABLE) IMPLANT
KIT BASIN OR (CUSTOM PROCEDURE TRAY) ×2 IMPLANT
KIT ROOM TURNOVER OR (KITS) ×2 IMPLANT
MILL MEDIUM DISP (BLADE) ×1 IMPLANT
NEEDLE HYPO 22GX1.5 SAFETY (NEEDLE) ×2 IMPLANT
NS IRRIG 1000ML POUR BTL (IV SOLUTION) ×2 IMPLANT
PACK LAMINECTOMY NEURO (CUSTOM PROCEDURE TRAY) ×2 IMPLANT
ROD RADIUS 45MM (Rod) ×4 IMPLANT
ROD SPNL 45X5.5XNS TI RDS (Rod) IMPLANT
SCREW 6.75X45MM (Screw) ×2 IMPLANT
SCREW 6.75X50MM (Screw) ×1 IMPLANT
SPONGE GAUZE 4X4 12PLY (GAUZE/BANDAGES/DRESSINGS) ×2 IMPLANT
SPONGE SURGIFOAM ABS GEL 100 (HEMOSTASIS) ×2 IMPLANT
STRIP CLOSURE SKIN 1/2X4 (GAUZE/BANDAGES/DRESSINGS) ×3 IMPLANT
SUT PROLENE 6 0 BV (SUTURE) ×1 IMPLANT
SUT VIC AB 0 CT1 18XCR BRD8 (SUTURE) ×2 IMPLANT
SUT VIC AB 0 CT1 8-18 (SUTURE) ×4
SUT VIC AB 2-0 CT1 18 (SUTURE) ×2 IMPLANT
SUT VIC AB 3-0 SH 8-18 (SUTURE) ×4 IMPLANT
SYR 20ML ECCENTRIC (SYRINGE) ×2 IMPLANT
TAPE CLOTH SURG 4X10 WHT LF (GAUZE/BANDAGES/DRESSINGS) ×1 IMPLANT
TOWEL OR 17X24 6PK STRL BLUE (TOWEL DISPOSABLE) ×2 IMPLANT
TOWEL OR 17X26 10 PK STRL BLUE (TOWEL DISPOSABLE) ×2 IMPLANT
TRAY FOLEY CATH 14FRSI W/METER (CATHETERS) ×1 IMPLANT
TRAY FOLEY IC TEMP SENS 16FR (CATHETERS) ×1 IMPLANT
WATER STERILE IRR 1000ML POUR (IV SOLUTION) ×2 IMPLANT
WEDGE TANGENT 10X26MM ×1 IMPLANT

## 2013-05-23 NOTE — Anesthesia Postprocedure Evaluation (Signed)
Anesthesia Post Note  Patient: [name]  Procedure(s) Performed: Procedure(s) (LRB): LUMBAR FOUR-FIVE DECOMPRESSIVE LUMBAR LAMINECTOMY POSTERIOR LUMBAR INTERBODY FUSION, TANGENT TELAMON CAGE POSTERIOLATERAL ARTHRODESIS (N/A)  Anesthesia type: general  Patient location: PACU  Post pain: Pain level controlled  Post assessment: Patient's Cardiovascular Status Stable  Last Vitals:  Filed Vitals:   05/23/13 1357  BP:   Pulse: 51  Temp:   Resp: 13    Post vital signs: Reviewed and stable  Level of consciousness: sedated  Complications: No apparent anesthesia complications

## 2013-05-23 NOTE — Brief Op Note (Signed)
05/23/2013  12:58 PM  PATIENT:  Desiree Hane  52 y.o. male  PRE-OPERATIVE DIAGNOSIS:  stenosis  POST-OPERATIVE DIAGNOSIS:  stenosis  PROCEDURE:  Procedure(s) with comments: LUMBAR FOUR-FIVE DECOMPRESSIVE LUMBAR LAMINECTOMY POSTERIOR LUMBAR INTERBODY FUSION, TANGENT TELAMON CAGE POSTERIOLATERAL ARTHRODESIS (N/A) - POSTERIOR LUMBAR FUSION 1 LEVEL  SURGEON:  Surgeon(s) and Role:    * Temple Pacini, MD - Primary    * Maeola Harman, MD - Assisting  PHYSICIAN ASSISTANT:   ASSISTANTS:    ANESTHESIA:   general  EBL:  Total I/O In: 1700 [I.V.:1700] Out: 575 [Urine:425; Blood:150]  BLOOD ADMINISTERED:none  DRAINS: (Medium) Hemovact drain(s) in the Epidural space with  Suction Open   LOCAL MEDICATIONS USED:  MARCAINE     SPECIMEN:  No Specimen  DISPOSITION OF SPECIMEN:  N/A  COUNTS:  YES  TOURNIQUET:  * No tourniquets in log *  DICTATION: .Dragon Dictation  PLAN OF CARE: Admit to inpatient   PATIENT DISPOSITION:  PACU - hemodynamically stable.   Delay start of Pharmacological VTE agent (>24hrs) due to surgical blood loss or risk of bleeding: yes

## 2013-05-23 NOTE — Op Note (Signed)
Date of procedure: 05/23/2013  Date of dictation: Same  Service: Neurosurgery  Preoperative diagnosis: L4-5 stenosis with neurogenic claudication status post previous L4-5 decompressive surgery  Postoperative diagnosis: Same  Procedure Name: Reexploration of L4-5 decompressive laminotomies with bilateral complete L4 and L5 decompressive laminectomies with bilateral L4 and L5 decompressive foraminotomies for relief of symptomatic nerve root compression, more than would be required for simple interbody fusion alone.  L4-5 posterior lumbar interbody fusion utilizing tangent interbody allograft wedge, Telamon interbody peek cage, and morselized local autograft.  L4-5 posterior lateral arthrodesis utilizing nonsegmental pedicle screw instrumentation and local autograft.  Surgeon:Hampton Wixom A.Matheus Spiker, M.D.  Asst. Surgeon: Venetia Maxon  Anesthesia: General  Indication: 52 year old male with severe back and bilateral lower extremity pain paresthesias and weakness and symptoms of neurogenic claudication failing conservative measure workup demonstrates evidence of spondylosis and stenosis with some element of instability and marked foraminal 6 stenosis bilaterally at L4-5. Patient has failed conservative management and presents now for L4-5 decompression infusion in hopes of improving his symptoms.  Operative note: After induction of anesthesia, patient positioned prone onto Wilson frame and appropriately padded. Lumbar region prepped and draped. Incision made overlying L4-5. Bilateral subperiosteal dissection performed exposing the lamina facet joints and transverse processes of L4 and L5. Retractor placed. Fluoroscopy used. Level confirmed. Previous laminotomies were dissected free and epidural scar was resected. Complete laminectomy of L4 was then performed using Leksell rongeurs Kerrison rongeurs the high-speed drill. Complete inferior facetectomies of L4 superior facetectomies L5 and the superior aspect of  lamina of L5 were also removed. All bone was cleaned in use and later on grafting. Ligament flavum and epidural scar were elevated and resected. Bilateral wide decompressive foraminotomies were performed on course exiting L4 and L5 nerve roots bilaterally. This went very 30 depression achieved. Bilateral discectomies and performed at L4-5. The space and distraction up to 10 mm a 10 mm distractor less than patient's left side. Thecal sac and nerve respect on the right side. The spaces and reamed and then cut with 10 mm tangent is Mr. soft tissues removed and interspace. A 10 x 26 mm tangent allograft wedge was then impacted in place recessed approximately 1 mm from the posterior cortical margin. Distractor was removed patient's left side. Thecal sac or respect to the left side. Once again the spaces and reamed and then cut with 10 mm tangent is. Soft tissues removed and interspace. Morselize autograft was packed into the interspace for later fusion. A 10 x 22 mm Telamon cage was then impacted in place recessed approximately 1-2 mm the posterior cortical margin of L4. Pedicles of L4 and L5 were notified using surface landmarks and intraoperative fluoroscopy. Superficial bone was removed overlying the pedicle. Pedicles and probed using a pedicle awl each pedicle awl track was probed and found to be solidly within the bone. 6.75 mm diameter radius brand screws were then placed bilaterally at L4 and L5. Short segment titanium rods and placed in the screw heads at L4-5. Locking caps and placed over the screws were locking caps and engaged with the construct under compression. Transverse processes were decorticated high-speed drill. Morselized autograft was packed posterolaterally for later fusion. Final images revealed good position the bone graft and hardware proper for level with normal lamina spine. Wound is irrigated one final time. Gelfoam was placed topically for hemostasis. Medium Hemovac drain was left at per  space. Wounds and close in layers with Vicryl sutures. Steri-Strips and sterile dressing were applied. There were no apparent complications. Patient tolerated  the procedure well and he returns to the recovery postop.

## 2013-05-23 NOTE — Transfer of Care (Signed)
Immediate Anesthesia Transfer of Care Note  Patient: [name]  Procedure(s) Performed: Procedure(s) with comments: LUMBAR FOUR-FIVE DECOMPRESSIVE LUMBAR LAMINECTOMY POSTERIOR LUMBAR INTERBODY FUSION, TANGENT TELAMON CAGE POSTERIOLATERAL ARTHRODESIS (N/A) - POSTERIOR LUMBAR FUSION 1 LEVEL  Patient Location: PACU  Anesthesia Type:General  Level of Consciousness: awake and alert   Airway & Oxygen Therapy: Patient Spontanous Breathing and Patient connected to nasal cannula oxygen  Post-op Assessment: Report given to PACU RN, Post -op Vital signs reviewed and stable and Patient moving all extremities X 4  Post vital signs: Reviewed and stable  Complications: No apparent anesthesia complications

## 2013-05-23 NOTE — Anesthesia Preprocedure Evaluation (Addendum)
Anesthesia Evaluation  Patient identified by MRN, date of birth, ID band Patient awake    Reviewed: Allergy & Precautions, H&P , NPO status   History of Anesthesia Complications (+) PONV  Airway       Dental  (+) Dental Advisory Given   Pulmonary sleep apnea and Continuous Positive Airway Pressure Ventilation , former smoker,          Cardiovascular hypertension, Pt. on home beta blockers + angina + CAD and +CHF + dysrhythmias Atrial Fibrillation + pacemaker  EF 35% by most recent echo 09/19/10 05/18/2013 Myocardial perfusion study: LV Ejection Fraction: 42%.  LV Wall Motion:  Very slight global hypokinesis       Neuro/Psych  Headaches, PSYCHIATRIC DISORDERS Depression    GI/Hepatic Neg liver ROS, PUD, GERD-  Medicated,  Endo/Other  Hyperthyroidism   Renal/GU negative Renal ROS     Musculoskeletal   Abdominal   Peds  Hematology   Anesthesia Other Findings   Reproductive/Obstetrics                         Anesthesia Physical Anesthesia Plan  ASA: III  Anesthesia Plan: General   Post-op Pain Management:    Induction: Intravenous  Airway Management Planned: Oral ETT  Additional Equipment: Arterial line  Intra-op Plan:   Post-operative Plan: Extubation in OR  Informed Consent:   Plan Discussed with: CRNA, Anesthesiologist and Surgeon  Anesthesia Plan Comments:         Anesthesia Quick Evaluation

## 2013-05-23 NOTE — Preoperative (Signed)
Beta Blockers   Reason not to administer Beta Blockers:Not Applicable, pt took carvedilol 9/23

## 2013-05-23 NOTE — H&P (Signed)
[name] is an 52 y.o. male.   Chief Complaint: Back pain HPI: 52 year old male with intractable back pain with radiation into both lower extremities which worsens with prolonged standing or walking and is consistent with neurogenic claudication. Patient's failed conservative management her workup demonstrates evidence of significant disc space collapse and stenosis at L4-5. Patient presents now for L4-5 decompression infusion in hopes of improving his symptoms. Past Medical History  Diagnosis Date  . GERD (gastroesophageal reflux disease)   . HTN (hypertension)   . Obesity   . CAD -nonobstructive     S/P nstemi-type II cath 05/2008 - nonobs. dzs.  . Pulmonary embolism 04/2003    Hx of  . Atrial fibrillation     s/p afib ablation  PVI 5/12 JA  . Sinus node dysfunction     s/p MDT PPM  . Hyperlipemia   . Systolic and diastolic CHF, chronic     EF 35% by most recent echo 09/19/10  . PUD (peptic ulcer disease)     gastritis due to ETOH previously  . Hyperthyroidism     Graves dz on methimazole and followed by Dr Everardo All  . Polysubstance abuse     cocaine, last used 1999  . Pancreatitis   . Rectal bleeding   . Hemorrhoids   . Pacemaker-medtronic- AAI     Dual chamber V port plugged   . DDD (degenerative disc disease)     evaluate by neurosurgery is ongoing  . Complication of anesthesia   . PONV (postoperative nausea and vomiting)     2010 after went home -infection  . Anginal pain     freq  goes away quickly  . Depression     ptsd  . Erectile dysfunction   . Shortness of breath     LYING DOWN AT TIMES  . Sleep apnea     uses CPAP [facility] 2 YRS  . Headache(784.0)   . 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 Pacemaker insertion  10/09    by Dr Graciela Husbands  . Fingers removed from right hand.  2/84    traumatic work injury  . Lumbar spine surgery  02/2009    Dr. Dutch Quint  . Atrial ablation  surgery  12/2010    afib ablation by Dr Johney Frame  . Insert / replace / remove pacemaker      05/2008    . Cardiac catheterization      CADIAC ABLATION DR Johney Frame 12/2010  . Anterior cervical decomp/discectomy fusion  12/15/2011    Procedure: ANTERIOR CERVICAL DECOMPRESSION/DISCECTOMY FUSION 1 LEVEL;  Surgeon: Temple Pacini, MD;  Location: [facility];  Service: Neurosurgery;  Laterality: N/A;  Cervical Six-Seven Anterior Cervical Diskectomy fusion with Allograft and Plating    Family History  Problem Relation Age of Onset  . Heart disease Mother   . Breast cancer Mother   . Lung cancer Father   . Diabetes    . Colon cancer Neg Hx    Social History:  reports that he quit smoking about 10 years ago. His smoking use included Cigarettes. He has a 15 pack-year smoking history. He has never used smokeless tobacco. He reports that he does not drink alcohol or use illicit drugs.  Allergies:  Allergies  Allergen Reactions  . Celebrex [Celecoxib] Itching  . Hydrocodone Itching  . Prednisolone Itching    Medications Prior to Admission  Medication Sig Dispense Refill  . Azelastine HCl (ASTELIN NA) Place 1 spray into the nose daily.       . carvedilol (COREG) 25 MG tablet Take 25 mg by mouth 2 (two) times daily with a meal.       . chlorhexidine (PERIDEX) 0.12 % solution Use as directed 15 mLs in the mouth or throat 2 (two) times daily.      . dabigatran (PRADAXA) 150 MG CAPS Take 1 capsule (150 mg total) by mouth every 12 (twelve) hours.  60 capsule  10  . fluticasone (FLONASE) 50 MCG/ACT nasal spray Place 2 sprays into the nose daily.      [name] gabapentin (NEURONTIN) 100 MG capsule Take 100 mg by mouth daily.      [name] ibuprofen (ADVIL,MOTRIN) 800 MG tablet Take 800 mg by mouth every 8 (eight) hours as needed for pain.      [name] losartan (COZAAR) 50 MG tablet Take 1 tablet (50 mg total) by mouth daily.  30 tablet  3  . methimazole (TAPAZOLE) 10 MG tablet Take 1 tablet (10 mg total) by mouth daily.  30  tablet  2  . montelukast (SINGULAIR) 10 MG tablet Take 10 mg by mouth at bedtime.      [name] omeprazole (PRILOSEC) 40 MG capsule Take 40 mg by mouth daily.       [name] oxyCODONE-acetaminophen (PERCOCET) 5-325 MG per tablet Take 1-2 tablets by mouth every 6 (six) hours as needed. For pain.      . potassium chloride SA (K-DUR,KLOR-CON) 20 MEQ tablet Take 20 mEq by mouth daily.      . sildenafil (VIAGRA) 100 MG tablet Take 100 mg by mouth daily as needed. For erectile dysfunction      . simvastatin (ZOCOR) 40 MG tablet Take 40 mg by mouth every evening.      . loratadine (CLARITIN) 10 MG tablet Take 10 mg by mouth daily.      . nitroGLYCERIN (NITROSTAT) 0.4 MG SL tablet Place 0.4 mg under the tongue every 5 (five) minutes as needed. For chest pain      . testosterone (ANDROGEL) 50 MG/5GM GEL Place 5 g onto the skin daily.        No results found for this or any previous visit (from the past 48 hour(s)). No results found.  Review of Systems  HENT: Negative.   Eyes: Negative.   Respiratory: Negative.   Cardiovascular: Negative.   Gastrointestinal: Negative.   Genitourinary: Negative.   Musculoskeletal: Negative.   Skin: Negative.   Neurological: Negative.   Endo/Heme/Allergies: Negative.   Psychiatric/Behavioral: Negative.     Blood pressure 120/75, pulse 52, temperature 97.6 F (36.4 C), temperature source Oral, resp. rate 18, SpO2 97.00%. Physical Exam  Constitutional: He is oriented to person, place, and time. He appears well-developed and well-nourished. No distress.  HENT:  Head: Normocephalic and atraumatic.  Right Ear: External ear normal.  Left Ear: External ear normal.  Nose: Nose normal.  Mouth/Throat: Oropharynx is clear and moist. No oropharyngeal exudate.  Eyes: Conjunctivae and EOM are normal. Pupils are equal, round, and reactive to light. Right eye exhibits no discharge. Left eye exhibits no discharge.  Neck: Normal range of motion. Neck supple. No tracheal deviation  present. No thyromegaly present.  Cardiovascular: Normal rate, regular rhythm, normal heart sounds and intact distal pulses.   Respiratory: Effort normal and breath sounds normal. No respiratory distress. He has no wheezes.  GI: Soft. Bowel sounds are normal. He exhibits  no distension. There is no tenderness.  Musculoskeletal: Normal range of motion. He exhibits no edema and no tenderness.  Neurological: He is alert and oriented to person, place, and time. He has normal reflexes. He displays normal reflexes. No cranial nerve deficit. He exhibits normal muscle tone. Coordination normal.  Skin: Skin is warm and dry. No rash noted. He is not diaphoretic. No erythema. No pallor.  Psychiatric: He has a normal mood and affect. His behavior is normal. Judgment and thought content normal.     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 Plan L4-5 redo decompressive laminectomy with posterior lumbar interbody fusion utilizing tangent interbody allograft wedge Telamon interbody peek cage and local autograft coupled with posterior lateral arthrodesis utilizing nonsegmental pedicle screws the patient and local autograft. Risks and benefits been explained. Patient wishes to proceed.  Roney Youtz A 05/23/2013, 9:27 AM

## 2013-05-23 NOTE — Anesthesia Procedure Notes (Signed)
Procedure Name: Intubation Date/Time: 05/23/2013 9:47 AM Performed by: Elon Alas Pre-anesthesia Checklist: Patient identified, Timeout performed, Emergency Drugs available, Suction available and Patient being monitored Patient Re-evaluated:Patient Re-evaluated prior to inductionOxygen Delivery Method: Circle system utilized Preoxygenation: Pre-oxygenation with 100% oxygen Intubation Type: IV induction Ventilation: Mask ventilation without difficulty Laryngoscope Size: Mac and 4 Grade View: Grade I Tube type: Oral Tube size: 7.5 mm Number of attempts: 1 Airway Equipment and Method: Stylet Placement Confirmation: positive ETCO2,  ETT inserted through vocal cords under direct vision and breath sounds checked- equal and bilateral Secured at: 23 cm Tube secured with: Tape Dental Injury: Teeth and Oropharynx as per pre-operative assessment

## 2013-05-24 MED FILL — Heparin Sodium (Porcine) Inj 1000 Unit/ML: INTRAMUSCULAR | Qty: 30 | Status: AC

## 2013-05-24 MED FILL — Sodium Chloride IV Soln 0.9%: INTRAVENOUS | Qty: 1000 | Status: AC

## 2013-05-24 NOTE — Progress Notes (Signed)
UR COMPLETED  

## 2013-05-24 NOTE — Evaluation (Signed)
Physical Therapy Evaluation Patient Details Name: [name] MRN: [id number] DOB: [birth year] Today's Date: 05/24/2013 Time: 1478-2956 PT Time Calculation (min): 22 min  PT Assessment / Plan / Recommendation History of Present Illness  Pt presents for PLIF L4-5 with h/o ACDF, MI, pacer, CHF, PE. Pt had weakness and pain bilateral LE prior to surgery  Clinical Impression  Pt mobilizing well. Ambulated without AD but with increased pain and fwd flexed posture. With RW, pt able to ambulate with decreased pain and improved posture. Recommend RW for d/c home to use until f/u with MD. Recommend outpt PT when appropriate for core and LE strengthening. No further acute PT needs at this time. Pt encouraged to ambulate in hall with RW once each hour awake.     PT Assessment  All further PT needs can be met in the next venue of care    Follow Up Recommendations  Outpatient PT    Does the patient have the potential to tolerate intense rehabilitation      Barriers to Discharge        Equipment Recommendations  Rolling walker with 5" wheels    Recommendations for Other Services     Frequency      Precautions / Restrictions Precautions Precautions: Back Precaution Booklet Issued: Yes (comment) Precaution Comments: reviewed precautions and pt kept effectively with mobiltiy Required Braces or Orthoses: Spinal Brace Spinal Brace: Lumbar corset;Applied in sitting position Restrictions Weight Bearing Restrictions: No   Pertinent Vitals/Pain 6/10 back pain, rest given after ambulation      Mobility  Bed Mobility Bed Mobility: Rolling Left;Left Sidelying to Sit;Sitting - Scoot to Edge of Bed Rolling Left: 5: Supervision Left Sidelying to Sit: 5: Supervision Sitting - Scoot to Edge of Bed: 5: Supervision Details for Bed Mobility Assistance: vc's for precautions but pt able to perform safely without physical assist Transfers Transfers: Sit to Stand;Stand to Sit Sit to Stand: 6:  Modified independent (Device/Increase time) Stand to Sit: 6: Modified independent (Device/Increase time) Ambulation/Gait Ambulation/Gait Assistance: 5: Supervision;6: Modified independent (Device/Increase time) Ambulation Distance (Feet): 250 Feet Assistive device: Rolling walker;None Ambulation/Gait Assistance Details: pt ambulated 1' with supervision and no AD but with increased pain and fwd flexed posture. Given RW and had significantly less pain and improved posture.  Gait Pattern: Step-through pattern Gait velocity: WFL Stairs: Yes Stairs Assistance: 5: Supervision Stairs Assistance Details (indicate cue type and reason): vc's for sequencing and for using stairs as a tool for LE strengthening at home Stair Management Technique: One rail Right;Alternating pattern;Forwards Number of Stairs: 12 Wheelchair Mobility Wheelchair Mobility: No    Exercises     PT Diagnosis: Acute pain  PT Problem List: Decreased strength;Pain;Decreased mobility PT Treatment Interventions:       PT Goals(Current goals can be found in the care plan section) Acute Rehab PT Goals Patient Stated Goal: return home PT Goal Formulation: No goals set, d/c therapy  Visit Information  Last PT Received On: 05/24/13 Assistance Needed: +1 History of Present Illness: Pt presents for PLIF L4-5 with h/o ACDF, MI, pacer, CHF, PE. Pt had weakness and pain bilateral LE prior to surgery       Prior Functioning  Home Living Family/patient expects to be discharged to:: Private residence Living Arrangements: Alone Available Help at Discharge: Family;Available PRN/intermittently Type of Home: House Home Access: Stairs to enter [employer] of Steps: 4 Entrance Stairs-Rails: Right Home Layout: One level Home Equipment: None Additional Comments: pt's mother and sister live next door and pt  reports ex-wife can help him as well. He has tub/ shower Prior Function Level of Independence:  Independent Communication Communication: No difficulties    Cognition  Cognition Arousal/Alertness: Awake/alert Behavior During Therapy: WFL for tasks assessed/performed Overall Cognitive Status: Within Functional Limits for tasks assessed    Extremity/Trunk Assessment Upper Extremity Assessment Upper Extremity Assessment: Overall WFL for tasks assessed Lower Extremity Assessment Lower Extremity Assessment: RLE deficits/detail;LLE deficits/detail RLE Deficits / Details: quad grossly 4+/5, tightness in hamstrings LLE Deficits / Details: quad 4/5, tightness hamstrings Cervical / Trunk Assessment Cervical / Trunk Assessment: Normal   Balance Balance Balance Assessed: Yes Dynamic Standing Balance Dynamic Standing - Balance Support: No upper extremity supported;During functional activity Dynamic Standing - Level of Assistance: 5: Stand by assistance  End of Session PT - End of Session Equipment Utilized During Treatment: Gait belt;Back brace Activity Tolerance: Patient tolerated treatment well Patient left: in bed;with call bell/phone within reach Nurse Communication: Mobility status  GP   Lyanne Co, PT  Acute Rehab Services  [phone]   Lyanne Co 05/24/2013, 10:32 AM

## 2013-05-24 NOTE — Progress Notes (Signed)
Occupational Therapy Evaluation Patient Details Name: [name] MRN: [id number] DOB: 1961-01-14 Today's Date: 05/24/2013 Time: 4098-1191 OT Time Calculation (min): 31 min  OT Assessment / Plan / Recommendation History of present illness Pt presents for PLIF L4-5 with h/o ACDF, MI, pacer, CHF, PE. Pt had weakness and pain bilateral LE prior to surgery   Clinical Impression   PTA, pt lived alone and was independent with ADL and mobility. Pt educated on back precautions /AE and compensatory techniques. Given written handouts. Will return in am prior to D/C to review information. Pt will benefit from skilled OT services to facilitate D/C home due to below deficits.    OT Assessment  Patient needs continued OT Services    Follow Up Recommendations  No OT follow up    Barriers to Discharge      Equipment Recommendations  None recommended by OT (family has DME to use)    Recommendations for Other Services    Frequency  Min 2X/week    Precautions / Restrictions Precautions Precautions: Back Precaution Booklet Issued: Yes (comment) Precaution Comments: reviewed precautions and pt kept effectively with mobiltiy Required Braces or Orthoses: Spinal Brace Spinal Brace: Lumbar corset;Applied in sitting position Restrictions Weight Bearing Restrictions: No   Pertinent Vitals/Pain     ADL  Grooming: Supervision/safety;Set up Where Assessed - Grooming: Unsupported sitting Upper Body Bathing: Supervision/safety;Set up Where Assessed - Upper Body Bathing: Unsupported sitting Lower Body Bathing: Maximal assistance Where Assessed - Lower Body Bathing: Unsupported sit to stand Upper Body Dressing: Supervision/safety;Set up (able to donn brace independently) Where Assessed - Upper Body Dressing: Unsupported sitting Lower Body Dressing: Maximal assistance Where Assessed - Lower Body Dressing: Supported sit to stand Toilet Transfer: Supervision/safety Statistician Method: Other  (comment) (ambulating) Acupuncturist: Comfort height toilet Toileting - Clothing Manipulation and Hygiene: Minimal assistance Where Assessed - Engineer, mining and Hygiene: Sit to stand from 3-in-1 or toilet Equipment Used: Back brace;Gait belt;Long-handled shoe horn;Long-handled sponge;Reacher;Rolling walker;Sock aid Transfers/Ambulation Related to ADLs: S ADL Comments: educated pt on AE /compensatory techniques for back precautions. Also given booklet regarding back pain and sex.     OT Diagnosis: Generalized weakness;Acute pain  OT Problem List: Decreased strength;Decreased range of motion;Decreased knowledge of use of DME or AE;Decreased knowledge of precautions;Pain OT Treatment Interventions: Self-care/ADL training;DME and/or AE instruction;Therapeutic activities;Patient/family education   OT Goals(Current goals can be found in the care plan section) Acute Rehab OT Goals Patient Stated Goal: return home OT Goal Formulation: With patient Time For Goal Achievement: 05/31/13 Potential to Achieve Goals: Good  Visit Information  Last OT Received On: 05/24/13 Assistance Needed: +1 History of Present Illness: Pt presents for PLIF L4-5 with h/o ACDF, MI, pacer, CHF, PE. Pt had weakness and pain bilateral LE prior to surgery       Prior Functioning     Home Living Family/patient expects to be discharged to:: Private residence Living Arrangements: Alone Available Help at Discharge: Family;Available PRN/intermittently Type of Home: House Home Access: Stairs to enter [employer] of Steps: 4 Entrance Stairs-Rails: Right Home Layout: One level Home Equipment: None Additional Comments: pt's mother and sister live next door and pt reports ex-wife can help him as well. He has tub/ shower (family has equipment/DME that pt can use) Prior Function Level of Independence: Independent Communication Communication: No difficulties          Vision/Perception     Cognition  Cognition Arousal/Alertness: Awake/alert Behavior During Therapy: WFL for tasks assessed/performed Overall Cognitive  Status: Within Functional Limits for tasks assessed    Extremity/Trunk Assessment Upper Extremity Assessment Upper Extremity Assessment: Overall WFL for tasks assessed Lower Extremity Assessment Lower Extremity Assessment: Defer to PT evaluation RLE Deficits / Details: quad grossly 4+/5, tightness in hamstrings LLE Deficits / Details: quad 4/5, tightness hamstrings Cervical / Trunk Assessment Cervical / Trunk Assessment: Normal     Mobility Bed Mobility Bed Mobility: Rolling Left;Left Sidelying to Sit;Sitting - Scoot to Edge of Bed Rolling Left: 5: Supervision Left Sidelying to Sit: 5: Supervision Sitting - Scoot to Edge of Bed: 5: Supervision Details for Bed Mobility Assistance: vc's for precautions but pt able to perform safely without physical assist Transfers Transfers: Sit to Stand;Stand to Sit Sit to Stand: 6: Modified independent (Device/Increase time) Stand to Sit: 6: Modified independent (Device/Increase time) Details for Transfer Assistance: vc for back precautions     Exercise     Balance Balance Balance Assessed: Yes Dynamic Standing Balance Dynamic Standing - Balance Support: No upper extremity supported;During functional activity Dynamic Standing - Level of Assistance: 5: Stand by assistance   End of Session OT - End of Session Equipment Utilized During Treatment: Gait belt;Rolling walker;Back brace Activity Tolerance: Patient tolerated treatment well Patient left: in chair;with call bell/phone within reach Nurse Communication: Mobility status  GO     [name],[name] 05/24/2013, 1:44 PM [facility], OTR/L  [phone] 05/24/2013

## 2013-05-24 NOTE — Progress Notes (Signed)
Patient has home CPAP and he does wear it. I did not have to assist with anything.

## 2013-05-24 NOTE — Progress Notes (Signed)
Postop day 1. Patient reports good pain control. No leg pain. Overall feels better.  Afebrile. Vitals are stable. Drain output moderate. Urine output good. Awake and alert. Oriented and appropriate. Motor and sensory exam intact. Dressing clean and dry.  Doing well following lumbar decompression and fusion. Plan mobilization today. Possible discharge tomorrow.

## 2013-05-24 NOTE — Progress Notes (Signed)
Pt's hemovac drain pulled out, d/c'd by RN. Will monitor Pt. Rema Fendt, RN

## 2013-05-25 NOTE — Progress Notes (Signed)
OT Cancellation Note  Patient Details Name: [name] MRN: [id number] DOB: 06/18/1961   Cancelled Treatment:    Reason Eval/Treat Not Completed: Other (comment) Pt declined due to c.o headache. Will attempt later if schedule allows. [facility] Johanan Skorupski, OTR/L  [phone] 05/25/2013 05/25/2013, 12:09 PM

## 2013-05-25 NOTE — Progress Notes (Signed)
Postop day 2. Patient a little more sore today. Mobility marginal for discharge home. Patient requests one more day of therapy and pain control before discharge home.  Afebrile. Vital stable. Urine output good. Awake and alert. Oriented and appropriate. Motor and sensory function intact. Wound dressing clean and dry. Chest and abdomen benign.  Status post lumbar decompression and fusion. Overall progressing reasonably well. Tentatively plan discharge tomorrow.

## 2013-05-26 MED ORDER — DIAZEPAM 5 MG PO TABS: 5.0000 mg | ORAL_TABLET | Freq: Four times a day (QID) | ORAL | Status: DC | PRN

## 2013-05-26 MED ORDER — OXYCODONE-ACETAMINOPHEN 5-325 MG PO TABS: 1.0000 | ORAL_TABLET | Freq: Four times a day (QID) | ORAL | Status: DC | PRN

## 2013-05-26 NOTE — Progress Notes (Signed)
Pt. Alert and oriented,follows simple instructions, denies pain. Incision area without swelling, redness or S/S of infection. Voiding adequate clear yellow urine. Moving all extremities well and vitals stable and documented. Lumbar surgery notes instructions given to patient and family member for home safety and precautions. Pt. and family stated understanding of instructions given.  

## 2013-05-26 NOTE — Discharge Summary (Signed)
Physician Discharge Summary  Patient ID: [name] MRN: [id number] DOB/AGE: 1961-04-14 52 y.o.  Admit date: 05/23/2013 Discharge date: 05/26/2013  Admission Diagnoses:  Discharge Diagnoses:  Principal Problem:   Spinal stenosis, lumbar region, with neurogenic claudication   Discharged Condition: good  Hospital Course: The patient was admitted to the hospital and he underwent an uncomplicated L4-5 decompression and fusion. Postoperatively he is done well. Preoperative back and leg pain are improved. He's mobilizing. His pain is well controlled. He is ready for discharge home.  Consults:   Significant Diagnostic Studies:   Treatments:   Discharge Exam: Blood pressure 111/77, pulse 88, temperature 101.8 F (38.8 C), temperature source Oral, resp. rate 18, SpO2 91.00%. Awake and alert. Oriented and appropriate. Cranial nerve function intact. Motor and sensory function of the extremities normal. Wound clean and dry. Chest and abdomen benign.   Disposition: 01-Home or Self Care   Future Appointments Provider Department Dept Phone   08/07/2013 9:05 AM [facility] [place] [facility] [phone]       Medication List         ASTELIN NA  Place 1 spray into the nose daily.     carvedilol 25 MG tablet  Commonly known as:  COREG  Take 25 mg by mouth 2 (two) times daily with a meal.     chlorhexidine 0.12 % solution  Commonly known as:  PERIDEX  Use as directed 15 mLs in the mouth or throat 2 (two) times daily.     dabigatran 150 MG Caps capsule  Commonly known as:  PRADAXA  Take 1 capsule (150 mg total) by mouth every 12 (twelve) hours.     diazepam 5 MG tablet  Commonly known as:  VALIUM  Take 1-2 tablets (5-10 mg total) by mouth every 6 (six) hours as needed.     fluticasone 50 MCG/ACT nasal spray  Commonly known as:  FLONASE  Place 2 sprays into the nose daily.     gabapentin 100 MG capsule  Commonly known as:  NEURONTIN  Take  100 mg by mouth daily.     ibuprofen 800 MG tablet  Commonly known as:  ADVIL,MOTRIN  Take 800 mg by mouth every 8 (eight) hours as needed for pain.     loratadine 10 MG tablet  Commonly known as:  CLARITIN  Take 10 mg by mouth daily.     losartan 50 MG tablet  Commonly known as:  COZAAR  Take 1 tablet (50 mg total) by mouth daily.     methimazole 10 MG tablet  Commonly known as:  TAPAZOLE  Take 1 tablet (10 mg total) by mouth daily.     montelukast 10 MG tablet  Commonly known as:  SINGULAIR  Take 10 mg by mouth at bedtime.     nitroGLYCERIN 0.4 MG SL tablet  Commonly known as:  NITROSTAT  Place 0.4 mg under the tongue every 5 (five) minutes as needed. For chest pain     omeprazole 40 MG capsule  Commonly known as:  PRILOSEC  Take 40 mg by mouth daily.     oxyCODONE-acetaminophen 5-325 MG per tablet  Commonly known as:  PERCOCET/ROXICET  Take 1-2 tablets by mouth every 6 (six) hours as needed. For pain.     potassium chloride SA 20 MEQ tablet  Commonly known as:  K-DUR,KLOR-CON  Take 20 mEq by mouth daily.     sildenafil 100 MG tablet  Commonly known as:  VIAGRA  Take 100  mg by mouth daily as needed. For erectile dysfunction     simvastatin 40 MG tablet  Commonly known as:  ZOCOR  Take 40 mg by mouth every evening.     testosterone 50 MG/5GM Gel  Commonly known as:  ANDROGEL  Place 5 g onto the skin daily.           Follow-up Information   Follow up with Arthuro Canelo A, MD. Schedule an appointment as soon as possible for a visit in 2 weeks.   Specialty:  Neurosurgery   Contact information:   [address] [place] [address] [phone]       Signed: Marybell Robards A 05/26/2013, 9:21 AM

## 2013-05-29 ENCOUNTER — Emergency Department (HOSPITAL_COMMUNITY): Payer: Medicaid Other

## 2013-05-29 ENCOUNTER — Encounter (HOSPITAL_COMMUNITY): Payer: Self-pay | Admitting: *Deleted

## 2013-05-29 ENCOUNTER — Emergency Department (HOSPITAL_COMMUNITY)
Admission: EM | Admit: 2013-05-29 | Discharge: 2013-05-29 | Disposition: A | Discharge status: Home / Self Care | Payer: Medicaid Other | Attending: Emergency Medicine | Admitting: Emergency Medicine

## 2013-05-29 DIAGNOSIS — Z9861 Coronary angioplasty status: Secondary | ICD-10-CM | POA: Insufficient documentation

## 2013-05-29 DIAGNOSIS — I5042 Chronic combined systolic (congestive) and diastolic (congestive) heart failure: Secondary | ICD-10-CM | POA: Insufficient documentation

## 2013-05-29 DIAGNOSIS — N529 Male erectile dysfunction, unspecified: Secondary | ICD-10-CM | POA: Insufficient documentation

## 2013-05-29 DIAGNOSIS — Z7902 Long term (current) use of antithrombotics/antiplatelets: Secondary | ICD-10-CM | POA: Insufficient documentation

## 2013-05-29 DIAGNOSIS — Z79899 Other long term (current) drug therapy: Secondary | ICD-10-CM | POA: Insufficient documentation

## 2013-05-29 DIAGNOSIS — F329 Major depressive disorder, single episode, unspecified: Secondary | ICD-10-CM | POA: Insufficient documentation

## 2013-05-29 DIAGNOSIS — Z8711 Personal history of peptic ulcer disease: Secondary | ICD-10-CM | POA: Insufficient documentation

## 2013-05-29 DIAGNOSIS — Z86711 Personal history of pulmonary embolism: Secondary | ICD-10-CM | POA: Insufficient documentation

## 2013-05-29 DIAGNOSIS — I2510 Atherosclerotic heart disease of native coronary artery without angina pectoris: Secondary | ICD-10-CM | POA: Insufficient documentation

## 2013-05-29 DIAGNOSIS — IMO0002 Reserved for concepts with insufficient information to code with codable children: Secondary | ICD-10-CM | POA: Insufficient documentation

## 2013-05-29 DIAGNOSIS — E669 Obesity, unspecified: Secondary | ICD-10-CM | POA: Insufficient documentation

## 2013-05-29 DIAGNOSIS — I10 Essential (primary) hypertension: Secondary | ICD-10-CM | POA: Insufficient documentation

## 2013-05-29 DIAGNOSIS — Z87891 Personal history of nicotine dependence: Secondary | ICD-10-CM | POA: Insufficient documentation

## 2013-05-29 DIAGNOSIS — I209 Angina pectoris, unspecified: Secondary | ICD-10-CM | POA: Insufficient documentation

## 2013-05-29 DIAGNOSIS — Z9889 Other specified postprocedural states: Secondary | ICD-10-CM | POA: Insufficient documentation

## 2013-05-29 DIAGNOSIS — Z9981 Dependence on supplemental oxygen: Secondary | ICD-10-CM | POA: Insufficient documentation

## 2013-05-29 DIAGNOSIS — Z950 Presence of cardiac pacemaker: Secondary | ICD-10-CM | POA: Insufficient documentation

## 2013-05-29 DIAGNOSIS — G4730 Sleep apnea, unspecified: Secondary | ICD-10-CM | POA: Insufficient documentation

## 2013-05-29 DIAGNOSIS — R61 Generalized hyperhidrosis: Secondary | ICD-10-CM | POA: Insufficient documentation

## 2013-05-29 DIAGNOSIS — F3289 Other specified depressive episodes: Secondary | ICD-10-CM | POA: Insufficient documentation

## 2013-05-29 DIAGNOSIS — M549 Dorsalgia, unspecified: Secondary | ICD-10-CM | POA: Insufficient documentation

## 2013-05-29 LAB — CBC WITH DIFFERENTIAL/PLATELET
Eosinophils Absolute: 0.2 10*3/uL (ref 0.0–0.7)
Lymphocytes Relative: 35 % (ref 12–46)
Lymphs Abs: 1.8 10*3/uL (ref 0.7–4.0)
MCHC: 34.3 g/dL (ref 30.0–36.0)
Neutro Abs: 2.4 10*3/uL (ref 1.7–7.7)
Neutrophils Relative %: 47 % (ref 43–77)
Platelets: 242 10*3/uL (ref 150–400)
RBC: 5.36 MIL/uL (ref 4.22–5.81)
WBC: 5.2 10*3/uL (ref 4.0–10.5)

## 2013-05-29 LAB — BASIC METABOLIC PANEL
CO2: 29 mEq/L (ref 19–32)
Chloride: 95 mEq/L — ABNORMAL LOW (ref 96–112)
Creatinine, Ser: 1.11 mg/dL (ref 0.50–1.35)
GFR calc non Af Amer: 74 mL/min — ABNORMAL LOW (ref >90)
Glucose, Bld: 100 mg/dL — ABNORMAL HIGH (ref 70–99)
Potassium: 3.8 mEq/L (ref 3.5–5.1)
Sodium: 136 mEq/L (ref 135–145)

## 2013-05-29 LAB — URINALYSIS, ROUTINE W REFLEX MICROSCOPIC
Ketones, ur: 15 mg/dL — AB
Specific Gravity, Urine: 1.021 (ref 1.005–1.030)
Urobilinogen, UA: 2 mg/dL — ABNORMAL HIGH (ref 0.0–1.0)

## 2013-05-29 LAB — URINE MICROSCOPIC-ADD ON

## 2013-05-29 MED ORDER — HYDROMORPHONE HCL PF 1 MG/ML IJ SOLN
1.0000 mg | Freq: Once | INTRAMUSCULAR | Status: AC
  Administered 2013-05-29: 1 mg via INTRAMUSCULAR
  Filled 2013-05-29: qty 1

## 2013-05-29 MED ORDER — DIAZEPAM 5 MG/ML IJ SOLN: 5.0000 mg | Freq: Once | INTRAMUSCULAR | Status: DC

## 2013-05-29 MED ORDER — OXYCODONE-ACETAMINOPHEN 10-325 MG PO TABS: 1.0000 | ORAL_TABLET | ORAL | Status: DC | PRN

## 2013-05-29 MED ORDER — PANTOPRAZOLE SODIUM 40 MG PO TBEC
80.0000 mg | DELAYED_RELEASE_TABLET | Freq: Every day | ORAL | Status: DC
  Administered 2013-05-29: 80 mg via ORAL
  Filled 2013-05-29: qty 2

## 2013-05-29 MED ORDER — DIAZEPAM 5 MG PO TABS
5.0000 mg | ORAL_TABLET | Freq: Once | ORAL | Status: AC
  Administered 2013-05-29: 5 mg via ORAL
  Filled 2013-05-29: qty 1

## 2013-05-29 NOTE — ED Notes (Signed)
Pt states last dose of pain medication was at 0100 this morning.

## 2013-05-29 NOTE — ED Notes (Signed)
Family at bedside. 

## 2013-05-29 NOTE — ED Notes (Signed)
Pt c/o back pain. Pt reports sweating and not being able to sleep due to pain. Pt left hospital Friday after having back surgery. Since the surgery pt states he can not get comfortable and is continuously sweating. Pt's has not had any pain relief with pain medications. Pt is A&Ox4, respirations equal and unlabored, skin warm and dry.

## 2013-05-29 NOTE — Discharge Instructions (Signed)
Take 10-325 Percocet instead of the percocet you were prescribed post surgery. Continue Valium as prescribed as needed. Recommend ice packs to your back for symptoms at least 4 times per day. Follow up with Dr. Jordan Likes at your scheduled appointment. Return if symptoms worsen such as if you experience fever, an inability to ambulate, or incontinence of your bowels or bladder.  Back Injury Prevention Back injuries can be extremely painful and difficult to heal. After having one back injury, you are much more likely to experience another later on. It is important to learn how to avoid injuring or re-injuring your back. The following tips can help you to prevent a back injury. PHYSICAL FITNESS  Exercise regularly and try to develop good tone in your abdominal muscles. Your abdominal muscles provide a lot of the support needed by your back.  Do aerobic exercises (walking, jogging, biking, swimming) regularly.  Do exercises that increase balance and strength (tai chi, yoga) regularly. This can decrease your risk of falling and injuring your back.  Stretch before and after exercising.  Maintain a healthy weight. The more you weigh, the more stress is placed on your back. For every pound of weight, 10 times that amount of pressure is placed on the back. DIET  Talk to your caregiver about how much calcium and vitamin D you need per day. These nutrients help to prevent weakening of the bones (osteoporosis). Osteoporosis can cause broken (fractured) bones that lead to back pain.  Include good sources of calcium in your diet, such as dairy products, green, leafy vegetables, and products with calcium added (fortified).  Include good sources of vitamin D in your diet, such as milk and foods that are fortified with vitamin D.  Consider taking a nutritional supplement or a multivitamin if needed.  Stop smoking if you smoke. POSTURE  Sit and stand up straight. Avoid leaning forward when you sit or hunching  over when you stand.  Choose chairs with good low back (lumbar) support.  If you work at a desk, sit close to your work so you do not need to lean over. Keep your chin tucked in. Keep your neck drawn back and elbows bent at a right angle. Your arms should look like the letter "L."  Sit high and close to the steering wheel when you drive. Add a lumbar support to your car seat if needed.  Avoid sitting or standing in one position for too long. Take breaks to get up, stretch, and walk around at least once every hour. Take breaks if you are driving for long periods of time.  Sleep on your side with your knees slightly bent, or sleep on your back with a pillow under your knees. Do not sleep on your stomach. LIFTING, TWISTING, AND REACHING  Avoid heavy lifting, especially repetitive lifting. If you must do heavy lifting:  Stretch before lifting.  Work slowly.  Rest between lifts.  Use carts and dollies to move objects when possible.  Make several small trips instead of carrying 1 heavy load.  Ask for help when you need it.  Ask for help when moving big, awkward objects.  Follow these steps when lifting:  Stand with your feet shoulder-width apart.  Get as close to the object as you can. Do not try to pick up heavy objects that are far from your body.  Use handles or lifting straps if they are available.  Bend at your knees. Squat down, but keep your heels off the floor.  Keep  your shoulders pulled back, your chin tucked in, and your back straight.  Lift the object slowly, tightening the muscles in your legs, abdomen, and buttocks. Keep the object as close to the center of your body as possible.  When you put a load down, use these same guidelines in reverse.  Do not:  Lift the object above your waist.  Twist at the waist while lifting or carrying a load. Move your feet if you need to turn, not your waist.  Bend over without bending at your knees.  Avoid reaching over  your head, across a table, or for an object on a high surface. OTHER TIPS  Avoid wet floors and keep sidewalks clear of ice to prevent falls.  Do not sleep on a mattress that is too soft or too hard.  Keep items that are used frequently within easy reach.  Put heavier objects on shelves at waist level and lighter objects on lower or higher shelves.  Find ways to decrease your stress, such as exercise, massage, or relaxation techniques. Stress can build up in your muscles. Tense muscles are more vulnerable to injury.  Seek treatment for depression or anxiety if needed. These conditions can increase your risk of developing back pain. SEEK MEDICAL CARE IF:  You injure your back.  You have questions about diet, exercise, or other ways to prevent back injuries. MAKE SURE YOU:  Understand these instructions.  Will watch your condition.  Will get help right away if you are not doing well or get worse. Document Released: 09/24/2004 Document Revised: 11/09/2011 Document Reviewed: 09/28/2011 [facility] Patient Information 2014 [place], [place].

## 2013-05-29 NOTE — ED Notes (Signed)
To x-ray

## 2013-05-29 NOTE — ED Provider Notes (Signed)
CSN: [id number]     Arrival date & time 05/29/13  0536 History   None    Chief Complaint  Patient presents with  . Back Pain   (Consider location/radiation/quality/duration/timing/severity/associated sxs/prior Treatment) HPI Comments: Patient is a 52y/o male with an extensive PMH including atrial fibrillation, PE, and pacemaker insertion who is 6 days s/p uncomplicated L4-5 decompression and fusion with discharge on 05/26/2013. Patient presents today for back pain with associated diaphoresis. Patient states his back pain is aching and radiating down to his buttocks and b/l lower extremities. He denies any alleviating factors of his pain and has been taking Percocet and Valium without relief. Patient's wife states patient has been increasing diaphoretic during the night; patient drenches the bed at least three times causing his wife to need to change the sheets. Patient states he was febrile post lumbar fusion. Though neurosurgery notes comment on patient being afebrile, vitals indicated he was febrile to 101.77F on date of d/c (05/26/13) as well as 101.58F the day prior (05/25/13). Patient denies taking his temperature since he has been at home; he is afebrile in ED today. Patient denies associated cough, hemoptysis, CP, SOB, N/V, urinary symptoms, genital/perianal numbness, and bowel/bladder incontinence. Patient on Pradaxa.  Neurosurgeon - Dr. Jordan Likes  Patient is a 52 y.o. male presenting with back pain. The history is provided by the patient. No language interpreter was used.  Back Pain Associated symptoms: no chest pain, no numbness and no weakness     Past Medical History  Diagnosis Date  . GERD (gastroesophageal reflux disease)   . HTN (hypertension)   . Obesity   . CAD -nonobstructive     S/P nstemi-type II cath 05/2008 - nonobs. dzs.  . Pulmonary embolism 04/2003    Hx of  . Atrial fibrillation     s/p afib ablation  PVI 5/12 JA  . Sinus node dysfunction     s/p MDT PPM  . Hyperlipemia    . Systolic and diastolic CHF, chronic     EF 35% by most recent echo 09/19/10  . PUD (peptic ulcer disease)     gastritis due to ETOH previously  . Hyperthyroidism     Graves dz on methimazole and followed by Dr Everardo All  . Polysubstance abuse     cocaine, last used 1999  . Pancreatitis   . Rectal bleeding   . Hemorrhoids   . Pacemaker-medtronic- AAI     Dual chamber V port plugged   . DDD (degenerative disc disease)     evaluate by neurosurgery is ongoing  . Complication of anesthesia   . PONV (postoperative nausea and vomiting)     2010 after went home -infection  . Anginal pain     freq  goes away quickly  . Depression     ptsd  . Erectile dysfunction   . Shortness of breath     LYING DOWN AT TIMES  . Sleep apnea     uses CPAP [facility] 2 YRS  . Headache(784.0)   . 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 Pacemaker insertion  10/09    by Dr Graciela Husbands  . Fingers removed from right hand.  2/84    traumatic work injury  . Lumbar spine surgery  02/2009    Dr. Dutch Quint  . Atrial ablation surgery  12/2010    afib ablation by Dr Johney Frame  . Insert / replace /  remove pacemaker      05/2008    . Cardiac catheterization      CADIAC ABLATION DR Johney Frame 12/2010  . Anterior cervical decomp/discectomy fusion  12/15/2011    Procedure: ANTERIOR CERVICAL DECOMPRESSION/DISCECTOMY FUSION 1 LEVEL;  Surgeon: Temple Pacini, MD;  Location: [facility];  Service: Neurosurgery;  Laterality: N/A;  Cervical Six-Seven Anterior Cervical Diskectomy fusion with Allograft and Plating   Family History  Problem Relation Age of Onset  . Heart disease Mother   . Breast cancer Mother   . Lung cancer Father   . Diabetes    . Colon cancer Neg Hx    History  Substance Use Topics  . Smoking status: Former Smoker -- 1.00 packs/day for 15 years    Types: Cigarettes    Quit date: 04/04/2003  . Smokeless tobacco: Never Used  . Alcohol  Use: No     Comment: formerly heavy ETOH,  now drinks a fifth of liquor every 2 weeks    Review of Systems  Constitutional: Positive for diaphoresis.  Respiratory: Negative for shortness of breath.   Cardiovascular: Negative for chest pain.  Gastrointestinal: Negative for nausea and vomiting.  Genitourinary: Negative.   Musculoskeletal: Positive for back pain.  Neurological: Negative for weakness and numbness.  All other systems reviewed and are negative.    Allergies  Celebrex; Hydrocodone; and Prednisolone  Home Medications   Current Outpatient Rx  Name  Route  Sig  Dispense  Refill  . Azelastine HCl (ASTELIN NA)   Nasal   Place 1 spray into the nose daily.          . carvedilol (COREG) 25 MG tablet   Oral   Take 25 mg by mouth 2 (two) times daily with a meal.          . chlorhexidine (PERIDEX) 0.12 % solution   Mouth/Throat   Use as directed 15 mLs in the mouth or throat 2 (two) times daily.         . dabigatran (PRADAXA) 150 MG CAPS   Oral   Take 1 capsule (150 mg total) by mouth every 12 (twelve) hours.   60 capsule   10   . diazepam (VALIUM) 5 MG tablet   Oral   Take 1-2 tablets (5-10 mg total) by mouth every 6 (six) hours as needed.   60 tablet   0   . fluticasone (FLONASE) 50 MCG/ACT nasal spray   Nasal   Place 2 sprays into the nose daily.         [name] gabapentin (NEURONTIN) 100 MG capsule   Oral   Take 100 mg by mouth daily.         [name] loratadine (CLARITIN) 10 MG tablet   Oral   Take 10 mg by mouth daily.         [name] losartan (COZAAR) 50 MG tablet   Oral   Take 1 tablet (50 mg total) by mouth daily.   30 tablet   3   . methimazole (TAPAZOLE) 10 MG tablet   Oral   Take 1 tablet (10 mg total) by mouth daily.   30 tablet   2   . montelukast (SINGULAIR) 10 MG tablet   Oral   Take 10 mg by mouth at bedtime.         [name] omeprazole (PRILOSEC) 40 MG capsule   Oral   Take 40 mg by mouth daily.          [name]  oxyCODONE-acetaminophen  (PERCOCET/ROXICET) 5-325 MG per tablet   Oral   Take 1-2 tablets by mouth every 6 (six) hours as needed. For pain.   90 tablet   0   . potassium chloride SA (K-DUR,KLOR-CON) 20 MEQ tablet   Oral   Take 20 mEq by mouth daily.         . simvastatin (ZOCOR) 40 MG tablet   Oral   Take 40 mg by mouth every evening.         . testosterone (ANDROGEL) 50 MG/5GM GEL   Transdermal   Place 5 g onto the skin daily.         . nitroGLYCERIN (NITROSTAT) 0.4 MG SL tablet   Sublingual   Place 0.4 mg under the tongue every 5 (five) minutes as needed. For chest pain         . oxyCODONE-acetaminophen (PERCOCET) 10-325 MG per tablet   Oral   Take 1 tablet by mouth every 4 (four) hours as needed for pain.   29 tablet   0   . sildenafil (VIAGRA) 100 MG tablet   Oral   Take 100 mg by mouth daily as needed. For erectile dysfunction          BP 139/114  Pulse 80  Temp(Src) 98.7 F (37.1 C) (Oral)  Resp 18  SpO2 94%  Physical Exam  Nursing note and vitals reviewed. Constitutional: He is oriented to person, place, and time. He appears well-developed and well-nourished. No distress.  HENT:  Head: Normocephalic and atraumatic.  Eyes: Conjunctivae and EOM are normal. Pupils are equal, round, and reactive to light. No scleral icterus.  Neck: Normal range of motion.  Cardiovascular: Normal rate, regular rhythm, normal heart sounds and intact distal pulses.   DP and PT pulses 2+ b/l  Pulmonary/Chest: Effort normal and breath sounds normal. No respiratory distress. He has no wheezes. He has no rales.  Musculoskeletal: Normal range of motion. He exhibits no edema.  Well appearing surgical scar to lumbar midline. No erythema, swelling, heat-to-touch, or purulent drainage. +TTP to lumbar spine (mild)  Neurological: He is alert and oriented to person, place, and time. He has normal reflexes.  Patient moves extremities without ataxia. DTRs normal and symmetric. No sensory or motor deficits  appreciated.  Skin: Skin is warm and dry. No rash noted. He is not diaphoretic. No erythema. No pallor.  Psychiatric: He has a normal mood and affect. His behavior is normal.    ED Course  Procedures (including critical care time) Labs Review Labs Reviewed  CBC WITH DIFFERENTIAL - Abnormal; Notable for the following:    MCV 77.8 (*)    RDW 15.6 (*)    Monocytes Relative 15 (*)    All other components within normal limits  BASIC METABOLIC PANEL - Abnormal; Notable for the following:    Chloride 95 (*)    Glucose, Bld 100 (*)    GFR calc non Af Amer 74 (*)    GFR calc Af Amer 86 (*)    All other components within normal limits  URINALYSIS, ROUTINE W REFLEX MICROSCOPIC - Abnormal; Notable for the following:    Color, Urine AMBER (*)    Hgb urine dipstick MODERATE (*)    Bilirubin Urine SMALL (*)    Ketones, ur 15 (*)    Protein, ur 30 (*)    Urobilinogen, UA 2.0 (*)    All other components within normal limits  URINE MICROSCOPIC-ADD ON   Imaging Review Dg Chest 2  View  05/29/2013   CLINICAL DATA:  Night sweats.  EXAM: CHEST  2 VIEW  COMPARISON:  05/16/2013  FINDINGS: Right single lead pacer remains in place, unchanged. Heart is normal size. Lungs are clear. No effusions or acute bony abnormality.  IMPRESSION: No acute findings.   Electronically Signed   By: Charlett Nose M.D.   On: 05/29/2013 07:03   Dg Lumbar Spine Complete  05/29/2013   CLINICAL DATA:  Night sweats. Back pain.  EXAM: LUMBAR SPINE - COMPLETE 4+ VIEW  COMPARISON:  Intraoperative imaging 05/23/2013  FINDINGS: Changes of posterior fusion at L4-5. Normal alignment. No hardware or bony complicating feature. Disc spaces are maintained.  IMPRESSION: Posterior fusion at L4-5. No acute findings.   Electronically Signed   By: Charlett Nose M.D.   On: 05/29/2013 07:05    MDM   1. Back pain    52 year old male presents for back pain x3 days. Patient recently underwent L4-5 fusion by Dr. Jordan Likes on 05/23/13. Patient denies any  red flags or signs concerning for cauda equina. He states he has been ambulatory with no numbness or weakness in his bilateral lower extremities. Patient concerned because he has been experiencing some night sweats since d/c from the hospital on 05/26/13. Patient afebrile and hemodynamically stable in ED; he is well and nontoxic appearing. Pain controlled with in ED with IM Dilaudid. Will workup with basic labs, and urinalysis, chest x-ray, and x-ray of the lumbar spine.  Labs without leukocytosis, anemia, or electrolyte imbalance. Kidney function preserved and urinalysis nonsuggestive of infection. Chest x-ray shows no signs of pneumonia, pneumothorax, pleural effusion, or other cardiopulmonary abnormality. DG lumbar spine significant for stable L4-5 fusion. Have consulted with Dr. Jordan Likes regarding patient case and work up. Based on findings, Dr. Jordan Likes, agrees that patient is stable for outpatient follow up. Dr. Jordan Likes has advised 10-325 percocet for pain control. Patient advised to continue Valium as prescribed by Dr. Jordan Likes as needed. Return precautions discussed and patient agreeable to plan with no unaddressed concerns.    Antony Madura, PA-C 05/30/13 1704

## 2013-05-31 NOTE — ED Provider Notes (Signed)
Medical screening examination/treatment/procedure(s) were performed by non-physician practitioner and as supervising physician I was immediately available for consultation/collaboration.  Buel Molder, MD 05/31/13 1141 

## 2013-06-29 ENCOUNTER — Other Ambulatory Visit: Payer: Self-pay | Admitting: Internal Medicine

## 2013-07-14 ENCOUNTER — Encounter: Payer: Self-pay | Admitting: Internal Medicine

## 2013-08-02 ENCOUNTER — Encounter: Payer: Self-pay | Admitting: Pulmonary Disease

## 2013-08-02 ENCOUNTER — Ambulatory Visit (INDEPENDENT_AMBULATORY_CARE_PROVIDER_SITE_OTHER): Payer: Medicaid Other | Admitting: Pulmonary Disease

## 2013-08-02 VITALS — BP 130/90 | HR 56 | Temp 98.4°F | Ht 71.0 in | Wt 289.0 lb

## 2013-08-02 DIAGNOSIS — G4733 Obstructive sleep apnea (adult) (pediatric): Secondary | ICD-10-CM

## 2013-08-02 NOTE — Assessment & Plan Note (Signed)
The patient appears to be doing well from a sleep apnea standpoint on CPAP. I have encouraged him to keep up with his mask changes and supplies, and to work aggressively on weight loss. I will see him back in one year if he is stable.

## 2013-08-02 NOTE — Patient Instructions (Signed)
Stay on cpap, and keep up with mask changes and supplies. Work on weight loss followup with me in one year 

## 2013-08-02 NOTE — Progress Notes (Signed)
   Subjective:    Patient ID: [name], male    DOB: Mar 02, 1961, 52 y.o.   MRN: [id number]  HPI The patient comes in today for followup of his obstructive sleep apnea. He is wearing CPAP compliantly, and is having no issues with his mask fit or pressure. He feels that he is sleeping well with the device, and has no daytime alertness issues. He has gained 7 pounds since the last visit, but has had multiple back operations which has limited his activity.   Review of Systems  Constitutional: Negative for fever and unexpected weight change.  HENT: Negative for congestion, dental problem, ear pain, nosebleeds, postnasal drip, rhinorrhea, sinus pressure, sneezing, sore throat and trouble swallowing.   Eyes: Negative for redness and itching.  Respiratory: Negative for cough, chest tightness, shortness of breath and wheezing.   Cardiovascular: Positive for leg swelling. Negative for palpitations.  Gastrointestinal: Negative for nausea and vomiting.  Genitourinary: Negative for dysuria.  Musculoskeletal: Negative for joint swelling.       Recent back sx Sept 23, 2014  Skin: Negative for rash.  Neurological: Negative for headaches.  Hematological: Does not bruise/bleed easily.  Psychiatric/Behavioral: Negative for dysphoric mood. The patient is not nervous/anxious.        Objective:   Physical Exam Obese male in no acute distress Nose without purulence or discharge noted No skin breakdown or pressure necrosis from the CPAP mask Neck without lymphadenopathy or thyromegaly.  Well healed anterior cervical surgical scar. Lower extremities with mild edema, no cyanosis Alert and oriented, does not appear to be sleepy, moves all 4 extremities.       Assessment & Plan:

## 2013-08-07 ENCOUNTER — Encounter: Payer: Medicaid Other | Admitting: *Deleted

## 2013-08-15 ENCOUNTER — Telehealth: Payer: Self-pay | Admitting: *Deleted

## 2013-08-15 NOTE — Telephone Encounter (Signed)
Patient called requesting an alternative for losartan, due to his insurance no longer covering it. It does not look like he has tried and failed any other ace inhibitors. He has been without this medication for about a month. Please advise. Thanks, MI

## 2013-08-16 NOTE — Telephone Encounter (Signed)
Forwarded to Audrie Lia, Pharmacist (per her request) to review.

## 2013-08-16 NOTE — Telephone Encounter (Signed)
Spoke with [facility].   Pt had  Medicaid and they do not pay for any ARB any longer unless pt has failed an ACE-I.  Reviewed pt's chart.  He took benazepril in 2010 and stopped due to a cough.  PA filled out for Medicaid with this information in hopes to get losartan covered.  LMOM for pt with this information and to call back to verify coverage has been restored.

## 2013-08-16 NOTE — Telephone Encounter (Signed)
ERROR

## 2013-08-18 ENCOUNTER — Encounter: Payer: Self-pay | Admitting: *Deleted

## 2013-08-25 ENCOUNTER — Telehealth: Payer: Self-pay | Admitting: Cardiology

## 2013-08-25 ENCOUNTER — Encounter (HOSPITAL_COMMUNITY): Payer: Self-pay | Admitting: Emergency Medicine

## 2013-08-25 DIAGNOSIS — R079 Chest pain, unspecified: Secondary | ICD-10-CM | POA: Insufficient documentation

## 2013-08-25 DIAGNOSIS — Z950 Presence of cardiac pacemaker: Secondary | ICD-10-CM | POA: Insufficient documentation

## 2013-08-25 DIAGNOSIS — I2510 Atherosclerotic heart disease of native coronary artery without angina pectoris: Secondary | ICD-10-CM | POA: Insufficient documentation

## 2013-08-25 DIAGNOSIS — I10 Essential (primary) hypertension: Secondary | ICD-10-CM | POA: Insufficient documentation

## 2013-08-25 DIAGNOSIS — Z87891 Personal history of nicotine dependence: Secondary | ICD-10-CM | POA: Insufficient documentation

## 2013-08-25 DIAGNOSIS — I5042 Chronic combined systolic (congestive) and diastolic (congestive) heart failure: Secondary | ICD-10-CM | POA: Insufficient documentation

## 2013-08-25 LAB — CBC
HCT: 43.2 % (ref 39.0–52.0)
MCH: 27.2 pg (ref 26.0–34.0)
MCV: 80.4 fL (ref 78.0–100.0)
RBC: 5.37 MIL/uL (ref 4.22–5.81)
RDW: 16.3 % — ABNORMAL HIGH (ref 11.5–15.5)
WBC: 6.8 10*3/uL (ref 4.0–10.5)

## 2013-08-25 LAB — POCT I-STAT TROPONIN I

## 2013-08-25 NOTE — Telephone Encounter (Signed)
Mr. Witczak called to report left side chest pain that radiated to his left arm. Pain is no longer in his chest but has remained in his arm despite taking 2 aspirin tablets and resting for the past 1 hour on the couch. Did not take SL NTG as he took viagra at 7pm tonight. He did have intercourse without CP. States that he had an episode yesterday where he had chest pain and sweats. He states the symptoms are somewhat different from when he had his "massive" heart attack. Since these symptoms could be consistent with AMI I recommended to he to an ER for evaluation.

## 2013-08-25 NOTE — ED Notes (Addendum)
Pt. sts chest pain began at 9pm called his Cardiologist who told him to come to ED. Pt took Bayer Asprin 325 x 2.  Pradaxa, Carveilol, and  Viagra at 7. Pain is left chest radiating down left arm and hand. No N/V/D.

## 2013-08-25 NOTE — Telephone Encounter (Deleted)
Mr. Grundman called to report left side chest pain that radiated to his left arm. Pain is no longer in his chest but has remained in his arm despite taking 2 aspirin tablets and resting for the past 1 hour on the couch. Did not take SL NTG as he took viagra at 7pm tonight. He did have intercourse without CP. States that he had an episode yesterday where he had chest pain and sweats. He states the symptoms are somewhat different from when he had his "massive" heart attack.

## 2013-08-26 ENCOUNTER — Emergency Department (HOSPITAL_COMMUNITY)
Admission: EM | Admit: 2013-08-26 | Discharge: 2013-08-26 | Discharge status: Against Medical Advice | Payer: Medicaid Other | Attending: Emergency Medicine | Admitting: Emergency Medicine

## 2013-08-26 LAB — BASIC METABOLIC PANEL
CO2: 26 mEq/L (ref 19–32)
Creatinine, Ser: 1.14 mg/dL (ref 0.50–1.35)
Potassium: 5 mEq/L (ref 3.5–5.1)
Sodium: 137 mEq/L (ref 135–145)

## 2013-08-26 NOTE — ED Notes (Signed)
Pt sts "the nurse back there told me it wasn't my heart. I just wanted to make sure." Pt informed of wait time.

## 2013-09-13 DIAGNOSIS — F1911 Other psychoactive substance abuse, in remission: Secondary | ICD-10-CM | POA: Insufficient documentation

## 2013-09-18 ENCOUNTER — Other Ambulatory Visit: Payer: Self-pay | Admitting: Neurosurgery

## 2013-09-18 DIAGNOSIS — M48062 Spinal stenosis, lumbar region with neurogenic claudication: Secondary | ICD-10-CM

## 2013-10-03 DIAGNOSIS — E291 Testicular hypofunction: Secondary | ICD-10-CM | POA: Insufficient documentation

## 2013-10-03 DIAGNOSIS — L249 Irritant contact dermatitis, unspecified cause: Secondary | ICD-10-CM | POA: Insufficient documentation

## 2013-10-30 ENCOUNTER — Other Ambulatory Visit: Payer: Self-pay | Admitting: Neurosurgery

## 2013-10-30 DIAGNOSIS — M48061 Spinal stenosis, lumbar region without neurogenic claudication: Secondary | ICD-10-CM

## 2013-11-02 ENCOUNTER — Other Ambulatory Visit: Payer: Self-pay

## 2013-11-02 MED ORDER — METHIMAZOLE 10 MG PO TABS: 10.0000 mg | ORAL_TABLET | Freq: Every day | ORAL | Status: DC

## 2013-11-08 ENCOUNTER — Emergency Department (HOSPITAL_COMMUNITY)
Admission: EM | Admit: 2013-11-08 | Discharge: 2013-11-08 | Disposition: A | Discharge status: Home / Self Care | Payer: Medicaid Other | Attending: Emergency Medicine | Admitting: Emergency Medicine

## 2013-11-08 ENCOUNTER — Encounter (HOSPITAL_COMMUNITY): Payer: Self-pay | Admitting: Emergency Medicine

## 2013-11-08 DIAGNOSIS — E669 Obesity, unspecified: Secondary | ICD-10-CM | POA: Insufficient documentation

## 2013-11-08 DIAGNOSIS — E785 Hyperlipidemia, unspecified: Secondary | ICD-10-CM | POA: Insufficient documentation

## 2013-11-08 DIAGNOSIS — I4891 Unspecified atrial fibrillation: Secondary | ICD-10-CM | POA: Insufficient documentation

## 2013-11-08 DIAGNOSIS — G4730 Sleep apnea, unspecified: Secondary | ICD-10-CM | POA: Insufficient documentation

## 2013-11-08 DIAGNOSIS — Z7902 Long term (current) use of antithrombotics/antiplatelets: Secondary | ICD-10-CM | POA: Insufficient documentation

## 2013-11-08 DIAGNOSIS — Z86711 Personal history of pulmonary embolism: Secondary | ICD-10-CM | POA: Insufficient documentation

## 2013-11-08 DIAGNOSIS — I209 Angina pectoris, unspecified: Secondary | ICD-10-CM | POA: Insufficient documentation

## 2013-11-08 DIAGNOSIS — N529 Male erectile dysfunction, unspecified: Secondary | ICD-10-CM | POA: Insufficient documentation

## 2013-11-08 DIAGNOSIS — Z9889 Other specified postprocedural states: Secondary | ICD-10-CM | POA: Insufficient documentation

## 2013-11-08 DIAGNOSIS — H6120 Impacted cerumen, unspecified ear: Secondary | ICD-10-CM | POA: Insufficient documentation

## 2013-11-08 DIAGNOSIS — I10 Essential (primary) hypertension: Secondary | ICD-10-CM | POA: Insufficient documentation

## 2013-11-08 DIAGNOSIS — Z79899 Other long term (current) drug therapy: Secondary | ICD-10-CM | POA: Insufficient documentation

## 2013-11-08 DIAGNOSIS — Z87891 Personal history of nicotine dependence: Secondary | ICD-10-CM | POA: Insufficient documentation

## 2013-11-08 DIAGNOSIS — Z950 Presence of cardiac pacemaker: Secondary | ICD-10-CM | POA: Insufficient documentation

## 2013-11-08 DIAGNOSIS — Z8711 Personal history of peptic ulcer disease: Secondary | ICD-10-CM | POA: Insufficient documentation

## 2013-11-08 DIAGNOSIS — I2510 Atherosclerotic heart disease of native coronary artery without angina pectoris: Secondary | ICD-10-CM | POA: Insufficient documentation

## 2013-11-08 DIAGNOSIS — E0590 Thyrotoxicosis, unspecified without thyrotoxic crisis or storm: Secondary | ICD-10-CM | POA: Insufficient documentation

## 2013-11-08 DIAGNOSIS — Z9981 Dependence on supplemental oxygen: Secondary | ICD-10-CM | POA: Insufficient documentation

## 2013-11-08 DIAGNOSIS — F3289 Other specified depressive episodes: Secondary | ICD-10-CM | POA: Insufficient documentation

## 2013-11-08 DIAGNOSIS — I5042 Chronic combined systolic (congestive) and diastolic (congestive) heart failure: Secondary | ICD-10-CM | POA: Insufficient documentation

## 2013-11-08 DIAGNOSIS — IMO0002 Reserved for concepts with insufficient information to code with codable children: Secondary | ICD-10-CM | POA: Insufficient documentation

## 2013-11-08 DIAGNOSIS — K219 Gastro-esophageal reflux disease without esophagitis: Secondary | ICD-10-CM | POA: Insufficient documentation

## 2013-11-08 DIAGNOSIS — F329 Major depressive disorder, single episode, unspecified: Secondary | ICD-10-CM | POA: Insufficient documentation

## 2013-11-08 MED ORDER — NEOMYCIN-POLYMYXIN-HC 3.5-10000-1 OT SUSP: 4.0000 [drp] | Freq: Four times a day (QID) | OTIC | Status: DC

## 2013-11-08 MED ORDER — CARBAMIDE PEROXIDE 6.5 % OT SOLN: 5.0000 [drp] | OTIC | Status: DC | PRN

## 2013-11-08 NOTE — Discharge Instructions (Signed)
Read the information below.  Use the prescribed medication as directed.  Please discuss all new medications with your pharmacist.  You may return to the Emergency Department at any time for worsening condition or any new symptoms that concern you.  If you develop fevers, uncontrolled pain, bleeding or other discharge from your ear, return to the ER for a recheck.    Cerumen Impaction A cerumen impaction is when the wax in your ear forms a plug. This plug usually causes reduced hearing. Sometimes it also causes an earache or dizziness. Removing a cerumen impaction can be difficult and painful. The wax sticks to the ear canal. The canal is sensitive and bleeds easily. If you try to remove a heavy wax buildup with a cotton tipped swab, you may push it in further. Irrigation with water, suction, and small ear curettes may be used to clear out the wax. If the impaction is fixed to the skin in the ear canal, ear drops may be needed for a few days to loosen the wax. People who build up a lot of wax frequently can use ear wax removal products available in your local drugstore. SEEK MEDICAL CARE IF:  You develop an earache, increased hearing loss, or marked dizziness. Document Released: 09/24/2004 Document Revised: 11/09/2011 Document Reviewed: 11/14/2009 [facility] Patient Information 2014 [place], [place].

## 2013-11-08 NOTE — ED Provider Notes (Signed)
CSN: [id number]     Arrival date & time 11/08/13  0537 History   First MD Initiated Contact with Patient 11/08/13 [phone]     Chief Complaint  Patient presents with  . Otalgia     (Consider location/radiation/quality/duration/timing/severity/associated sxs/prior Treatment) HPI Patient presents with right ear discomfort.  States he got water in his right ear two days ago while doing water aerobics.  Has since felt that his ear is clogged, sounds are muffled.  He attempted using hydrogen peroxide and qtips without relief.  Denies fevers, chills, nasal congestion, sore throat, cough.  No recent illness.  No discharge from the ear.   Past Medical History  Diagnosis Date  . GERD (gastroesophageal reflux disease)   . HTN (hypertension)   . Obesity   . CAD -nonobstructive     S/P nstemi-type II cath 05/2008 - nonobs. dzs.  . Pulmonary embolism 04/2003    Hx of  . Atrial fibrillation     s/p afib ablation  PVI 5/12 JA  . Sinus node dysfunction     s/p MDT PPM  . Hyperlipemia   . Systolic and diastolic CHF, chronic     EF 35% by most recent echo 09/19/10  . PUD (peptic ulcer disease)     gastritis due to ETOH previously  . Hyperthyroidism     Graves dz on methimazole and followed by [name]  . Polysubstance abuse     cocaine, last used 1999  . Pancreatitis   . Rectal bleeding   . Hemorrhoids   . Pacemaker-medtronic- AAI     Dual chamber V port plugged   . DDD (degenerative disc disease)     evaluate by neurosurgery is ongoing  . Complication of anesthesia   . PONV (postoperative nausea and vomiting)     2010 after went home -infection  . Anginal pain     freq  goes away quickly  . Depression     ptsd  . Erectile dysfunction   . Shortness of breath     LYING DOWN AT TIMES  . Sleep apnea     uses CPAP [facility] 2 YRS  . Headache(784.0)   . 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 Pacemaker insertion   10/09    by Dr [name]  . Fingers removed from right hand.  2/84    traumatic work injury  . Lumbar spine surgery  02/2009    Dr. Trenton Gammon  . Atrial ablation surgery  12/2010    afib ablation by [name]  . Insert / replace / remove pacemaker      05/2008    . Cardiac catheterization      CADIAC ABLATION [name] 12/2010  . Anterior cervical decomp/discectomy fusion  12/15/2011    Procedure: ANTERIOR CERVICAL DECOMPRESSION/DISCECTOMY FUSION 1 LEVEL;  Surgeon: [name], MD;  Location: [facility];  Service: Neurosurgery;  Laterality: N/A;  Cervical Six-Seven Anterior Cervical Diskectomy fusion with Allograft and Plating   Family History  Problem Relation Age of Onset  . Heart disease Mother   . Breast cancer Mother   . Lung cancer Father   . Diabetes    . Colon cancer Neg Hx    History  Substance Use Topics  . Smoking status: Former Smoker -- 1.00 packs/day for 15 years    Types: Cigarettes    Quit date: 04/04/2003  . Smokeless  tobacco: Never Used  . Alcohol Use: No     Comment: formerly heavy ETOH,  now drinks a fifth of liquor every 2 weeks    Review of Systems  Constitutional: Negative for fever and chills.  HENT: Positive for ear pain. Negative for congestion, ear discharge, facial swelling, sore throat and trouble swallowing.   Respiratory: Negative for cough and shortness of breath.   Skin: Negative for rash.  Allergic/Immunologic: Negative for immunocompromised state.  Hematological: Does not bruise/bleed easily.      Allergies  Celebrex; Hydrocodone; and Prednisolone  Home Medications   Current Outpatient Rx  Name  Route  Sig  Dispense  Refill  . Azelastine HCl (ASTELIN NA)   Nasal   Place 1 spray into the nose daily.          . carvedilol (COREG) 25 MG tablet   Oral   Take 25 mg by mouth 2 (two) times daily with a meal.          . chlorhexidine (PERIDEX) 0.12 % solution   Mouth/Throat   Use as directed 15 mLs in the mouth or throat 2 (two) times  daily.         . dabigatran (PRADAXA) 150 MG CAPS   Oral   Take 1 capsule (150 mg total) by mouth every 12 (twelve) hours.   60 capsule   10   . diazepam (VALIUM) 5 MG tablet   Oral   Take 1-2 tablets (5-10 mg total) by mouth every 6 (six) hours as needed.   60 tablet   0   . fluticasone (FLONASE) 50 MCG/ACT nasal spray   Nasal   Place 2 sprays into the nose daily.         [name] gabapentin (NEURONTIN) 100 MG capsule   Oral   Take 100 mg by mouth daily.         [name] loratadine (CLARITIN) 10 MG tablet   Oral   Take 10 mg by mouth daily.         [name] losartan (COZAAR) 50 MG tablet      TAKE 1 TABLET (50 MG TOTAL) BY MOUTH DAILY.   30 tablet   10   . methimazole (TAPAZOLE) 10 MG tablet   Oral   Take 1 tablet (10 mg total) by mouth daily.   30 tablet   1     Needs appointment for further refills.   . montelukast (SINGULAIR) 10 MG tablet   Oral   Take 10 mg by mouth at bedtime.         . nitroGLYCERIN (NITROSTAT) 0.4 MG SL tablet   Sublingual   Place 0.4 mg under the tongue every 5 (five) minutes as needed. For chest pain         . omeprazole (PRILOSEC) 40 MG capsule   Oral   Take 40 mg by mouth daily.          [name] oxyCODONE-acetaminophen (PERCOCET) 10-325 MG per tablet   Oral   Take 1 tablet by mouth every 4 (four) hours as needed for pain.   29 tablet   0   . oxyCODONE-acetaminophen (PERCOCET/ROXICET) 5-325 MG per tablet   Oral   Take 1-2 tablets by mouth every 6 (six) hours as needed. For pain.   90 tablet   0   . potassium chloride SA (K-DUR,KLOR-CON) 20 MEQ tablet   Oral   Take 20 mEq by mouth daily.         [name]  sildenafil (VIAGRA) 100 MG tablet   Oral   Take 100 mg by mouth daily as needed. For erectile dysfunction         . simvastatin (ZOCOR) 40 MG tablet   Oral   Take 40 mg by mouth every evening.         . testosterone (ANDROGEL) 50 MG/5GM GEL   Transdermal   Place 5 g onto the skin daily.          BP 136/97  Pulse 50   Temp(Src) 98 F (36.7 C) (Oral)  Resp 18  Ht 5\' 11"  (1.803 m)  Wt 280 lb (127.007 kg)  BMI 39.07 kg/m2  SpO2 97% Physical Exam  Nursing note and vitals reviewed. Constitutional: He appears well-developed and well-nourished. No distress.  HENT:  Head: Normocephalic and atraumatic.  Right Ear: There is tenderness. No drainage.  Left Ear: No drainage or tenderness.  Right ear canal blocked by cerumen.  Unable to fully visualize.  Right ear: pain with traction of pinna. Left ear also with cerumen but not full impacted.  No tenderness.  Unable to visualize TMs.    Neck: Neck supple.  Pulmonary/Chest: Effort normal.  Neurological: He is alert.  Skin: He is not diaphoretic.    ED Course  Procedures (including critical care time) Labs Review Labs Reviewed - No data to display Imaging Review No results found.   EKG Interpretation None      MDM   Final diagnoses:  Cerumen impaction    Pt with right ear canal cerumen impaction.  Irrigated by nurse with great improvement and complete removal of cerumen.  Reexamination demonstrates clear canal and intact TM, no erythema or edema.  Pt reports hearing completely restored.  D/C home with cortisporin otic as pt had tenderness with palpation of tragus and traction on pinna and debrox (PRN) for cerumen buildup.  Discussed result, findings, treatment, and follow up  with patient.  Pt given return precautions.  Pt verbalizes understanding and agrees with plan.       [place], PA-C 11/08/13 [phone]

## 2013-11-08 NOTE — ED Notes (Signed)
Pt reports R earache that has been going on for 2 days after swimming. Pt denies any drainage, ringing or popping to ear. Pt states that his ear fells stuffy. Pt alert and ambulatory.

## 2013-11-08 NOTE — ED Provider Notes (Signed)
Medical screening examination/treatment/procedure(s) were performed by non-physician practitioner and as supervising physician I was immediately available for consultation/collaboration.   Teressa Lower, MD 11/08/13 [phone]

## 2013-12-05 ENCOUNTER — Telehealth: Payer: Self-pay | Admitting: Internal Medicine

## 2013-12-05 NOTE — Telephone Encounter (Signed)
New message    Having an myleogram on Monday at [facility].  Need ok to stop prodax on fri.  [facility] will fax a request

## 2013-12-05 NOTE — Telephone Encounter (Signed)
Informed patient that it was ok to stop Pradaxa on Friday, prior to Myelogram Monday. I explained that I have received no request from [facility] - he tells me they will need clearance to stop med - I told him that I would look into  This tomorrow and make sure they got clearance. He is also inquiring about f/u w/ Dr. [name] - he will see him back in September (one year follow up)

## 2013-12-06 NOTE — Telephone Encounter (Signed)
Left message at [facility] requesting clearance paperwork to be faxed.

## 2013-12-06 NOTE — Telephone Encounter (Signed)
Received request. Clearance faxed to [facility]

## 2013-12-11 ENCOUNTER — Inpatient Hospital Stay: Admission: RE | Admit: 2013-12-11 | Payer: Medicaid Other | Source: Ambulatory Visit

## 2013-12-11 ENCOUNTER — Other Ambulatory Visit: Payer: Medicaid Other

## 2014-01-12 ENCOUNTER — Other Ambulatory Visit: Payer: Medicaid Other

## 2014-01-16 ENCOUNTER — Encounter (INDEPENDENT_AMBULATORY_CARE_PROVIDER_SITE_OTHER): Payer: Self-pay | Admitting: General Surgery

## 2014-01-17 ENCOUNTER — Ambulatory Visit
Admission: RE | Admit: 2014-01-17 | Discharge: 2014-01-17 | Disposition: A | Discharge status: Home / Self Care | Payer: Medicaid Other | Source: Ambulatory Visit | Attending: Neurosurgery | Admitting: Neurosurgery

## 2014-01-17 DIAGNOSIS — M48061 Spinal stenosis, lumbar region without neurogenic claudication: Secondary | ICD-10-CM

## 2014-01-17 DIAGNOSIS — M5020 Other cervical disc displacement, unspecified cervical region: Secondary | ICD-10-CM

## 2014-01-17 MED ORDER — DIAZEPAM 5 MG PO TABS: 10.0000 mg | ORAL_TABLET | Freq: Once | ORAL | Status: DC

## 2014-01-17 MED ORDER — IOHEXOL 180 MG/ML  SOLN: 15.0000 mL | Freq: Once | INTRAMUSCULAR | Status: DC | PRN

## 2014-01-17 NOTE — Discharge Instructions (Signed)

## 2014-01-17 NOTE — Progress Notes (Signed)
Patient states he has been off Pradaxa for four days.  Brita Romp, RN

## 2014-01-30 ENCOUNTER — Ambulatory Visit (INDEPENDENT_AMBULATORY_CARE_PROVIDER_SITE_OTHER): Payer: Medicaid Other | Admitting: General Surgery

## 2014-01-30 ENCOUNTER — Encounter (INDEPENDENT_AMBULATORY_CARE_PROVIDER_SITE_OTHER): Payer: Self-pay | Admitting: General Surgery

## 2014-01-30 VITALS — BP 130/90 | HR 75 | Temp 98.6°F | Resp 16 | Ht 71.0 in | Wt 266.4 lb

## 2014-01-30 DIAGNOSIS — K648 Other hemorrhoids: Secondary | ICD-10-CM

## 2014-01-30 NOTE — Patient Instructions (Signed)
GETTING TO GOOD BOWEL HEALTH. Irregular bowel habits such as constipation and diarrhea can lead to many problems over time.  Having one soft bowel movement a day is the most important way to prevent further problems.  The anorectal canal is designed to handle stretching and feces to safely manage our ability to get rid of solid waste (feces, poop, stool) out of our body.  BUT, hard constipated stools can act like ripping concrete bricks and diarrhea can be a burning fire to this very sensitive area of our body, causing inflamed hemorrhoids, anal fissures, increasing risk is perirectal abscesses, abdominal pain/bloating, an making irritable bowel worse.     The goal: ONE SOFT BOWEL MOVEMENT A DAY!  To have soft, regular bowel movements:    Drink at least 8 tall glasses of water a day.     Take plenty of fiber.  Fiber is the undigested part of plant food that passes into the colon, acting s "natures broom" to encourage bowel motility and movement.  Fiber can absorb and hold large amounts of water. This results in a larger, bulkier stool, which is soft and easier to pass. Work gradually over several weeks up to 6 servings a day of fiber (25g a day even more if needed) in the form of: o Vegetables -- Root (potatoes, carrots, turnips), leafy green (lettuce, salad greens, celery, spinach), or cooked high residue (cabbage, broccoli, etc) o Fruit -- Fresh (unpeeled skin & pulp), Dried (prunes, apricots, cherries, etc ),  or stewed ( applesauce)  o Whole grain breads, pasta, etc (whole wheat)  o Bran cereals    Bulking Agents -- This type of water-retaining fiber generally is easily obtained each day by one of the following:  o Psyllium bran -- The psyllium plant is remarkable because its ground seeds can retain so much water. This product is available as Metamucil, Konsyl, Effersyllium, Per Diem Fiber, or the less expensive generic preparation in drug and health food stores. Although labeled a laxative, it really  is not a laxative.  o Methylcellulose -- This is another fiber derived from wood which also retains water. It is available as Citrucel. o Polyethylene Glycol - and "artificial" fiber commonly called Miralax or Glycolax.  It is helpful for people with gassy or bloated feelings with regular fiber o Flax Seed - a less gassy fiber than psyllium   No reading or other relaxing activity while on the toilet. If bowel movements take longer than 5 minutes, you are too constipated   AVOID CONSTIPATION.  High fiber and water intake usually takes care of this.  Sometimes a laxative is needed to stimulate more frequent bowel movements, but    Laxatives are not a good long-term solution as it can wear the colon out. o Osmotics (Milk of Magnesia, Fleets phosphosoda, Magnesium citrate, MiraLax, GoLytely) are safer than  o Stimulants (Senokot, Castor Oil, Dulcolax, Ex Lax)    o Do not take laxatives for more than 7days in a row.    IF SEVERELY CONSTIPATED, try a Bowel Retraining Program: o Do not use laxatives.  o Eat a diet high in roughage, such as bran cereals and leafy vegetables.  o Drink six (6) ounces of prune or apricot juice each morning.  o Eat two (2) large servings of stewed fruit each day.  o Take one (1) heaping tablespoon of a psyllium-based bulking agent twice a day. Use sugar-free sweetener when possible to avoid excessive calories.  o Eat a normal breakfast.  o   sit on the toilet, but do not strain to have a bowel movement.  o If you do not have a bowel movement by the third day, use an enema and repeat the above steps.  

## 2014-01-31 ENCOUNTER — Other Ambulatory Visit: Payer: Self-pay | Admitting: Internal Medicine

## 2014-01-31 ENCOUNTER — Other Ambulatory Visit: Payer: Self-pay

## 2014-01-31 DIAGNOSIS — I4891 Unspecified atrial fibrillation: Secondary | ICD-10-CM

## 2014-01-31 MED ORDER — DABIGATRAN ETEXILATE MESYLATE 150 MG PO CAPS: 150.0000 mg | ORAL_CAPSULE | Freq: Two times a day (BID) | ORAL | Status: DC

## 2014-01-31 NOTE — Progress Notes (Signed)
Patient ID: [name], male   DOB: October 24, 1960, 53 y.o.   MRN: [id number]  Chief Complaint  Patient presents with  . Rectal Bleeding    HPI [name] is a 53 y.o. male.   HPI 53 year old morbidly obese AfricanAmerican male referred by [name] for hemorrhoidal problems. I had actually seen the patient several years ago for similar problems. He states that he had some significant bleeding from his hemorrhoids a few weeks ago which prompted him to go to the [name] emergency room. He is on Pradaxa for A. Fib. He was told to stop it. His hemoglobin was reportedly normal in the emergency room. He states he resumed his Pradaxa. He states that he will have blood occasionally in the stool and commode. He reports daily bowel movements however he uses suppositories daily as well as Preparation H. He sometimes also manually places his finger into his rectum to help clean himself. He does not take any supplemental fiber. He drinks about 6 bottles of water a day. He denies any pain with defecation. He has some itching &burning if he does not get himself very clean. Past Medical History  Diagnosis Date  . GERD (gastroesophageal reflux disease)   . HTN (hypertension)   . Obesity   . CAD -nonobstructive     S/P nstemi-type II cath 05/2008 - nonobs. dzs.  . Pulmonary embolism 04/2003    Hx of  . Atrial fibrillation     s/p afib ablation  PVI 5/12 JA  . Sinus node dysfunction     s/p MDT PPM  . Hyperlipemia   . Systolic and diastolic CHF, chronic     EF 35% by most recent echo 09/19/10  . PUD (peptic ulcer disease)     gastritis due to ETOH previously  . Hyperthyroidism     Graves dz on methimazole and followed by [name]  . Polysubstance abuse     cocaine, last used 1999  . Pancreatitis   . Rectal bleeding   . Hemorrhoids   . Pacemaker-medtronic- AAI     Dual chamber V port plugged   . DDD (degenerative disc disease)     evaluate by neurosurgery is ongoing  .  Complication of anesthesia   . PONV (postoperative nausea and vomiting)     2010 after went home -infection  . Anginal pain     freq  goes away quickly  . Depression     ptsd  . Erectile dysfunction   . Shortness of breath     LYING DOWN AT TIMES  . Sleep apnea     uses CPAP [facility] 2 YRS  . Headache(784.0)   . 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 Pacemaker insertion  10/09    by [name] [name]  . Fingers removed from right hand.  2/84    traumatic work injury  . Lumbar spine surgery  02/2009    [name]. Trenton [name]  . Atrial ablation surgery  12/2010    afib ablation by [name]  . Insert / replace / remove pacemaker      05/2008    . Cardiac catheterization      CADIAC ABLATION [name] 12/2010  . Anterior cervical decomp/discectomy fusion  12/15/2011    Procedure: ANTERIOR CERVICAL DECOMPRESSION/DISCECTOMY FUSION 1 LEVEL;  Surgeon: [name];  Location: [facility];  Service: Neurosurgery;  Laterality: N/A;  Cervical Six-Seven Anterior Cervical Diskectomy fusion with Allograft and Plating    Family History  Problem Relation Age of Onset  . Heart disease Mother   . Breast cancer Mother   . Lung cancer Father   . Diabetes    . Colon cancer Neg Hx     Social History History  Substance Use Topics  . Smoking status: Former Smoker -- 1.00 packs/day for 15 years    Types: Cigarettes    Quit date: 04/04/2003  . Smokeless tobacco: Never Used  . Alcohol Use: No     Comment: formerly heavy ETOH,  now drinks a fifth of liquor every 2 weeks    Allergies  Allergen Reactions  . Prednisone Itching  . Celebrex [Celecoxib] Itching  . Hydrocodone Itching  . Prednisolone Itching    Current Outpatient Prescriptions  Medication Sig Dispense Refill  . Azelastine HCl (ASTELIN NA) Place 1 spray into the nose daily.       . carbamide peroxide (DEBROX) 6.5 % otic solution Place 5 drops into both ears as  needed (ear wax buildup).  15 mL  0  . carvedilol (COREG) 25 MG tablet Take 25 mg by mouth 2 (two) times daily with a meal.       . chlorhexidine (PERIDEX) 0.12 % solution Use as directed 15 mLs in the mouth or throat 2 (two) times daily.      . dabigatran (PRADAXA) 150 MG CAPS Take 1 capsule (150 mg total) by mouth every 12 (twelve) hours.  60 capsule  10  . diazepam (VALIUM) 5 MG tablet Take 1-2 tablets (5-10 mg total) by mouth every 6 (six) hours as needed.  60 tablet  0  . fluticasone (FLONASE) 50 MCG/ACT nasal spray Place 2 sprays into the nose daily.      [name] gabapentin (NEURONTIN) 100 MG capsule Take 100 mg by mouth daily.      [name] loratadine (CLARITIN) 10 MG tablet Take 10 mg by mouth daily.      [name] losartan (COZAAR) 50 MG tablet TAKE 1 TABLET (50 MG TOTAL) BY MOUTH DAILY.  30 tablet  10  . methimazole (TAPAZOLE) 10 MG tablet Take 1 tablet (10 mg total) by mouth daily.  30 tablet  1  . montelukast (SINGULAIR) 10 MG tablet Take 10 mg by mouth at bedtime.      [name] neomycin-polymyxin-hydrocortisone (CORTISPORIN) 3.5-10000-1 otic suspension Place 4 drops into the right ear 4 (four) times daily. X 7 days  10 mL  0  . nitroGLYCERIN (NITROSTAT) 0.4 MG SL tablet Place 0.4 mg under the tongue every 5 (five) minutes as needed. For chest pain      . omeprazole (PRILOSEC) 40 MG capsule Take 40 mg by mouth daily.       [name] oxyCODONE-acetaminophen (PERCOCET) 10-325 MG per tablet Take 1 tablet by mouth every 4 (four) hours as needed for pain.  29 tablet  0  . oxyCODONE-acetaminophen (PERCOCET/ROXICET) 5-325 MG per tablet Take 1-2 tablets by mouth every 6 (six) hours as needed. For pain.  90 tablet  0  . potassium chloride SA (K-DUR,KLOR-CON) 20 MEQ tablet Take 20 mEq by mouth daily.      . sildenafil (VIAGRA) 100 MG tablet Take 100 mg by mouth daily as needed. For erectile dysfunction      . simvastatin (ZOCOR) 40 MG tablet Take 40 mg by mouth every evening.      . testosterone (ANDROGEL) 50 MG/5GM GEL Place 5 g  onto the skin daily.      . [DISCONTINUED] vardenafil (LEVITRA) 20 MG tablet Take 1 tablet (20 mg total) by mouth daily as needed for erectile dysfunction.  20 tablet  3   No current facility-administered medications for this visit.    Review of Systems Review of Systems  Constitutional: Negative for fever, chills, appetite change and unexpected weight change.  HENT: Negative for congestion and trouble swallowing.   Eyes: Negative for visual disturbance.  Respiratory: Negative for chest tightness and shortness of breath.   Cardiovascular: Negative for chest pain and leg swelling.       No PND, no orthopnea, some DOE  Gastrointestinal:       See HPI  Genitourinary: Negative for dysuria and hematuria.  Musculoskeletal: Negative.   Skin: Negative for rash.  Neurological: Negative for seizures and speech difficulty.  Hematological: Does not bruise/bleed easily.  Psychiatric/Behavioral: Negative for behavioral problems and confusion.    Blood pressure 130/90, pulse 75, temperature 98.6 F (37 C), resp. rate 16, height 5\' 11"  (1.803 m), weight 266 lb 6.4 oz (120.838 kg).  Physical Exam Physical Exam  Vitals reviewed. Constitutional: He is oriented to person, place, and time. He appears well-developed and well-nourished. No distress.  Morbidly obese  HENT:  Head: Normocephalic and atraumatic.  Right Ear: External ear normal.  Left Ear: External ear normal.  Eyes: Conjunctivae are normal. No scleral icterus.  Neck: Normal range of motion. Neck supple. No tracheal deviation present.  Cardiovascular: Normal rate and normal heart sounds.   Pulmonary/Chest: Effort normal and breath sounds normal. No respiratory distress. He has no wheezes.  Abdominal: Soft. He exhibits no distension. There is no tenderness.  Genitourinary: Rectal exam shows internal hemorrhoid. Rectal exam shows no external hemorrhoid, no fissure and anal tone normal.  Good tone. Anoscopy shows left lateral and right  lateral internal hemorrhoids grade 1  Musculoskeletal: Normal range of motion.  Neurological: He is alert and oriented to person, place, and time. He exhibits normal muscle tone.  Skin: Skin is warm and dry. No rash noted. He is not diaphoretic. No erythema. No pallor.  Psychiatric: He has a normal mood and affect. His behavior is normal. Judgment and thought content normal.    Data Reviewed [name] Graciela Husbands note 05/2013 [name] Shelle Iron note 12/14 My last office note [name] Trevor Mace office note  Assessment    2 column bleeding internal hemorrhoids     Plan    We discussed the etiology of hemorrhoids. The patient was given educational material as well as diagrams. We discussed nonoperative and operative management of hemorrhoidal disease.  We discussed the importance of having a daily soft bowel movement and avoiding constipation. We also discussed good bowel habits such as not reading in the bathroom, not straining, and drinking 6-8 glasses of water per day. We also discussed the importance of a high fiber diet. We discussed foods that were high in fiber as well as fiber supplements. We discussed the importance of trying to get 25-30 g of fiber per day in their diet. We discussed the need to start with a low dose of fiber and then gradually increasing their daily fiber dose over several weeks in order to avoid bloating and cramping.  We then discussed different surgical techniques for hemorrhoids, specifically hemorrhoidal banding and excisional hemorrhoidectomy.  PLAN: my recommendation was to begin to start with nonoperative management. I want him to stop using suppositories daily. I also instructed him to stop placing his finger in his  rectum. I explained that by adopting a high fiber diet it should allow him to evacuate his rectum completely. So the interim he is going to start supplemental fiber and gradually increase it in order to avoid bloating and cramping. He is also given some suppositories. I will  bring him back in 6 weeks and see how he is doing. If He is still having bleeding and hemorrhoids are still symptomatic we'll entertain surgery at that time  [name]. Redmond Pulling, [name], FACS General, Bariatric, & Minimally Invasive Surgery [facility]          Gayland Curry 01/31/2014, 1:43 PM

## 2014-02-12 ENCOUNTER — Other Ambulatory Visit: Payer: Self-pay | Admitting: Neurosurgery

## 2014-02-12 DIAGNOSIS — M48061 Spinal stenosis, lumbar region without neurogenic claudication: Secondary | ICD-10-CM

## 2014-02-12 DIAGNOSIS — M4802 Spinal stenosis, cervical region: Secondary | ICD-10-CM

## 2014-02-12 DIAGNOSIS — M5416 Radiculopathy, lumbar region: Secondary | ICD-10-CM

## 2014-02-14 ENCOUNTER — Ambulatory Visit: Admission: RE | Admit: 2014-02-14 | Payer: Medicaid Other | Source: Ambulatory Visit

## 2014-02-14 ENCOUNTER — Ambulatory Visit
Admission: RE | Admit: 2014-02-14 | Discharge: 2014-02-14 | Disposition: A | Discharge status: Home / Self Care | Payer: Medicaid Other | Source: Ambulatory Visit | Attending: Neurosurgery | Admitting: Neurosurgery

## 2014-02-14 VITALS — BP 120/64 | HR 54

## 2014-02-14 DIAGNOSIS — IMO0002 Reserved for concepts with insufficient information to code with codable children: Secondary | ICD-10-CM

## 2014-02-14 DIAGNOSIS — M5020 Other cervical disc displacement, unspecified cervical region: Secondary | ICD-10-CM

## 2014-02-14 DIAGNOSIS — M48061 Spinal stenosis, lumbar region without neurogenic claudication: Secondary | ICD-10-CM

## 2014-02-14 DIAGNOSIS — M4802 Spinal stenosis, cervical region: Secondary | ICD-10-CM

## 2014-02-14 DIAGNOSIS — M5416 Radiculopathy, lumbar region: Secondary | ICD-10-CM

## 2014-02-14 DIAGNOSIS — M48062 Spinal stenosis, lumbar region with neurogenic claudication: Secondary | ICD-10-CM

## 2014-02-14 MED ORDER — HYDROMORPHONE HCL PF 2 MG/ML IJ SOLN
2.0000 mg | Freq: Once | INTRAMUSCULAR | Status: AC
  Administered 2014-02-14: 2 mg via INTRAMUSCULAR

## 2014-02-14 MED ORDER — ONDANSETRON HCL 4 MG/2ML IJ SOLN
4.0000 mg | Freq: Once | INTRAMUSCULAR | Status: AC
  Administered 2014-02-14: 4 mg via INTRAMUSCULAR

## 2014-02-14 MED ORDER — DIAZEPAM 5 MG PO TABS
10.0000 mg | ORAL_TABLET | Freq: Once | ORAL | Status: AC
  Administered 2014-02-14: 10 mg via ORAL

## 2014-02-14 MED ORDER — IOHEXOL 300 MG/ML  SOLN
10.0000 mL | Freq: Once | INTRAMUSCULAR | Status: AC | PRN
  Administered 2014-02-14: 10 mL via INTRATHECAL

## 2014-02-14 NOTE — Progress Notes (Signed)
Gustavus Bryant notified of 1145 discharge time (LMOVM).  jkl

## 2014-02-14 NOTE — Discharge Instructions (Signed)
Myelogram Discharge Instructions  1. Go home and rest quietly for the next 24 hours.  It is important to lie flat for the next 24 hours.  Get up only to go to the restroom.  You may lie in the bed or on a couch on your back, your stomach, your left side or your right side.  You may have one pillow under your head.  You may have pillows between your knees while you are on your side or under your knees while you are on your back.  2. DO NOT drive today.  Recline the seat as far back as it will go, while still wearing your seat belt, on the way home.  3. You may get up to go to the bathroom as needed.  You may sit up for 10 minutes to eat.  You may resume your normal diet and medications unless otherwise indicated.  Drink lots of extra fluids today and tomorrow.  4. The incidence of headache, nausea, or vomiting is about 5% (one in 20 patients).  If you develop a headache, lie flat and drink plenty of fluids until the headache goes away.  Caffeinated beverages may be helpful.  If you develop severe nausea and vomiting or a headache that does not go away with flat bed rest, call [phone].  5. You may resume normal activities after your 24 hours of bed rest is over; however, do not exert yourself strongly or do any heavy lifting tomorrow. If when you get up you have a headache when standing, go back to bed and force fluids for another 24 hours.  6. Call your physician for a follow-up appointment.  The results of your myelogram will be sent directly to your physician by the following day.  7. If you have any questions or if complications develop after you arrive home, please call [phone].  Discharge instructions have been explained to the patient.  The patient, or the person responsible for the patient, fully understands these instructions.      May resume Pradaxa and Tramadol on February 15, 2014, after 9:30 am.

## 2014-03-28 ENCOUNTER — Encounter (INDEPENDENT_AMBULATORY_CARE_PROVIDER_SITE_OTHER): Payer: Self-pay | Admitting: General Surgery

## 2014-03-28 ENCOUNTER — Ambulatory Visit (INDEPENDENT_AMBULATORY_CARE_PROVIDER_SITE_OTHER): Payer: Medicaid Other | Admitting: General Surgery

## 2014-03-28 VITALS — BP 121/80 | HR 79 | Temp 97.7°F | Resp 16 | Ht 71.0 in | Wt 260.2 lb

## 2014-03-28 DIAGNOSIS — K649 Unspecified hemorrhoids: Secondary | ICD-10-CM

## 2014-03-28 NOTE — Patient Instructions (Signed)
Continue drinking plenty of water Continue to use supplemental fiber (benefiber) daily

## 2014-03-29 NOTE — Progress Notes (Signed)
Subjective:     Patient ID: [name], male   DOB: [birth year], 53 y.o.   MRN: [id number]  HPI 53 year old morbidly obese African American male comes in for followup regarding bleeding internal hemorrhoids. Initially saw him about 6 weeks ago. At that time we instituted nonoperative management consisting of slowly adopting a high fiber diet. He states that he has been doing very well. He has had no bleeding episodes since I last saw him. He is doing Stage manager daily. He has decreased his suppository usage. He is taking 2 scoops Benefiber daily. He denies any pain with defecation.  PMHx, PSHx, SOCHx, FAMHx, ALL reviewed and unchanged  Review of Systems A 6 point Review of systems was performed and all systems are negative except for what is mentioned in the history of present illness     Objective:   Physical Exam  Constitutional: He is oriented to person, place, and time. He appears well-developed and well-nourished. No distress.  HENT:  Head: Normocephalic and atraumatic.  Neurological: He is alert and oriented to person, place, and time.  Skin: Skin is warm and dry. No rash noted. He is not diaphoretic. No erythema.  Psychiatric: He has a normal mood and affect. His behavior is normal. Judgment and thought content normal.   BP 121/80  Pulse 79  Temp(Src) 97.7 F (36.5 C) (Oral)  Resp 16  Ht 5\' 11"  (1.803 m)  Wt 260 lb 3.2 oz (118.026 kg)  BMI 36.31 kg/m2 Rectal Exam deferred    Assessment:     Bleeding internal hemorrhoids     Plan:     It appears he has done well since institution of high fiber diet. Since he is relatively asymptomatic at this point have recommended ongoing nonoperative management. We discussed the importance of ongoing daily use of supplemental fiber and drinking plenty of water. Followup as needed  Leighton Ruff. Redmond Pulling, MD, FACS General, Bariatric, & Minimally Invasive [facility], [place]

## 2014-04-19 ENCOUNTER — Ambulatory Visit (INDEPENDENT_AMBULATORY_CARE_PROVIDER_SITE_OTHER): Payer: Medicaid Other | Admitting: Internal Medicine

## 2014-04-19 ENCOUNTER — Telehealth: Payer: Self-pay | Admitting: Internal Medicine

## 2014-04-19 ENCOUNTER — Encounter: Payer: Self-pay | Admitting: Internal Medicine

## 2014-04-19 VITALS — BP 118/70 | HR 69 | Ht 71.0 in | Wt 263.0 lb

## 2014-04-19 DIAGNOSIS — I480 Paroxysmal atrial fibrillation: Secondary | ICD-10-CM

## 2014-04-19 DIAGNOSIS — I4891 Unspecified atrial fibrillation: Secondary | ICD-10-CM

## 2014-04-19 LAB — MDC_IDC_ENUM_SESS_TYPE_INCLINIC
Battery Impedance: 221 Ohm
Battery Voltage: 2.79 V
Brady Statistic RA Percent Paced: 19 %
Lead Channel Impedance Value: 458 Ohm
Lead Channel Impedance Value: 67 Ohm
Lead Channel Pacing Threshold Amplitude: 0.5 V
Lead Channel Sensing Intrinsic Amplitude: 2.8 mV
MDC IDC MSMT BATTERY REMAINING LONGEVITY: 150 mo
MDC IDC MSMT LEADCHNL RA PACING THRESHOLD PULSEWIDTH: 0.4 ms
MDC IDC SESS DTM: 20150820160440
MDC IDC SET LEADCHNL RA PACING AMPLITUDE: 2 V

## 2014-04-19 NOTE — Telephone Encounter (Signed)
New message    Went to see PCP on yesterday    C/O fell x 2 , dizziness, lightheaded, pain in legs,

## 2014-04-19 NOTE — Telephone Encounter (Signed)
C/o of dizziness, light-headedness, falling twice in last 2 weeks. No blacking out associated with fall. He has also been having headaches and feels like heart is racing. Appt made to see Dr. [name] today at  3:30 pm.

## 2014-04-19 NOTE — Progress Notes (Signed)
Patient Care Team: [name], MD as PCP - General (Family Medicine) Sable Feil, MD (Gastroenterology)   HPI  [name] is a 53 y.o. male Seen in followup for pacemaker implanted for sinus node dysfunction and paroxysmal atrial arrhythmias which persisted despite dofetilide.   He underwent catheter ablation of atrial fibrillation 2012. He had been on dofetilide; this was stopped March 2014.  He has had very little symptomatic atrial fibrillation.   Most recently he had problems with syncope. These episodes of mostly occurred with prolonged outside standing. It occasionally occurred while seated and had been ameliorated by lying down or sitting down. His urine is relatively yellow suggesting some degree of volume completion. He does not generally have orthostatic intolerance.   He has a history of ischemic heart disease with prior non-STEMI possibly type II as there was  no significant obstructive lesions noted. Catheterization was done in 2006 in 2000 and 90 showing nonobstructive disease. A Myoview scan 2011 was nonischemic. He also is a history of pulmonary embolism  Echocardiogram 2012 demonstrated mild LV dysfunction 45-50%  He continues to struggle with back and neck pain as well as leg pain none of it having been particularly improved by the back and neck surgeries which he endured earlier this year  He continues to have episodes of atypical chest pain. When he has this he takes extra aspirin and nitroglycerin. He is not using nitroglycerin and his Viagra concurrently.  Past Medical History  Diagnosis Date  . GERD (gastroesophageal reflux disease)   . HTN (hypertension)   . Obesity   . CAD -nonobstructive     S/P nstemi-type II cath 05/2008 - nonobs. dzs.  . Pulmonary embolism 04/2003    Hx of  . Atrial fibrillation     s/p afib ablation  PVI 5/12 JA  . Sinus node dysfunction     s/p MDT PPM  . Hyperlipemia   . Systolic and diastolic CHF, chronic     EF  35% by most recent echo 09/19/10  . PUD (peptic ulcer disease)     gastritis due to ETOH previously  . Hyperthyroidism     Graves dz on methimazole and followed by [name]  . Polysubstance abuse     cocaine, last used 1999  . Pancreatitis   . Rectal bleeding   . Hemorrhoids   . Pacemaker-medtronic- AAI     Dual chamber V port plugged   . DDD (degenerative disc disease)     evaluate by neurosurgery is ongoing  . Complication of anesthesia   . PONV (postoperative nausea and vomiting)     2010 after went home -infection  . Anginal pain     freq  goes away quickly  . Depression     ptsd  . Erectile dysfunction   . Shortness of breath     LYING DOWN AT TIMES  . Sleep apnea     uses CPAP [facility] 2 YRS  . Headache(784.0)   . 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 Pacemaker insertion  10/09    by Dr [name]  . Fingers removed from right hand.  2/84    traumatic work injury  . Lumbar spine surgery  02/2009    Dr. Trenton Gammon  . Atrial ablation surgery  12/2010    afib ablation by [name]  . Insert / replace / remove pacemaker  05/2008    . Cardiac catheterization      CADIAC ABLATION [name] 12/2010  . Anterior cervical decomp/discectomy fusion  12/15/2011    Procedure: ANTERIOR CERVICAL DECOMPRESSION/DISCECTOMY FUSION 1 LEVEL;  Surgeon: [name], MD;  Location: [place] [facility];  Service: Neurosurgery;  Laterality: N/A;  Cervical Six-Seven Anterior Cervical Diskectomy fusion with Allograft and Plating    Current Outpatient Prescriptions  Medication Sig Dispense Refill  . carvedilol (COREG) 25 MG tablet Take 25 mg by mouth 2 (two) times daily with a meal.       . dabigatran (PRADAXA) 150 MG CAPS capsule Take 1 capsule (150 mg total) by mouth every 12 (twelve) hours.  60 capsule  3  . diazepam (VALIUM) 5 MG tablet Take 1-2 tablets (5-10 mg total) by mouth every 6 (six) hours as needed.  60 tablet  0   . fluticasone (FLONASE) 50 MCG/ACT nasal spray Place 2 sprays into the nose as needed.       . gabapentin (NEURONTIN) 100 MG capsule Take 100 mg by mouth daily.      [name] loratadine (CLARITIN) 10 MG tablet Take 10 mg by mouth daily as needed.       [name] losartan (COZAAR) 50 MG tablet TAKE 1 TABLET (50 MG TOTAL) BY MOUTH DAILY.  30 tablet  10  . methimazole (TAPAZOLE) 10 MG tablet Take 1 tablet (10 mg total) by mouth daily.  30 tablet  1  . montelukast (SINGULAIR) 10 MG tablet Take 10 mg by mouth at bedtime.      . nitroGLYCERIN (NITROSTAT) 0.4 MG SL tablet Place 0.4 mg under the tongue every 5 (five) minutes as needed. For chest pain      . omeprazole (PRILOSEC) 40 MG capsule Take 40 mg by mouth daily.       [name] oxyCODONE-acetaminophen (PERCOCET) 10-325 MG per tablet Take 1 tablet by mouth every 4 (four) hours as needed for pain.  29 tablet  0  . potassium chloride SA (K-DUR,KLOR-CON) 20 MEQ tablet Take 20 mEq by mouth daily.      . sildenafil (VIAGRA) 100 MG tablet Take 100 mg by mouth daily as needed. For erectile dysfunction      . simvastatin (ZOCOR) 40 MG tablet Take 40 mg by mouth every evening.      . testosterone (ANDROGEL) 50 MG/5GM GEL Place 5 g onto the skin daily.      . [DISCONTINUED] vardenafil (LEVITRA) 20 MG tablet Take 1 tablet (20 mg total) by mouth daily as needed for erectile dysfunction.  20 tablet  3   No current facility-administered medications for this visit.    Allergies  Allergen Reactions  . Celebrex [Celecoxib] Itching  . Hydrocodone Itching  . Prednisolone Itching    Review of Systems negative except from HPI and PMH  Physical Exam BP 118/70  Pulse 69  Ht 5\' 11"  (1.803 m)  Wt 263 lb (119.296 kg)  BMI 36.70 kg/m2 Well developed and well nourished in no acute distress HENT normal E scleral and icterus clear Neck Supple JVP flat; carotids brisk and full Clear to ausculation  Regular rate and rhythm, no murmurs gallops or rub Soft with active bowel  sounds No clubbing cyanosis none Edema Alert and oriented, grossly normal motor and sensory function Skin Warm and Dry  ECG shows sinus rhythm at 65 Interval 17/11/46 Frequent PACs  Assessment and  Plan  Syncope  Atrial fibrillation recurrent based on his device  Sinus node dysfunction  Pacemaker-Medtronic The patient's device was interrogated and the information was fully reviewed.  The device was reprogrammed to  allow recording of his atrial high rate episodes to confirm atrial fibrillation  Chronic pain  Chest pain syndrome  His chest pain syndrome is almost certainly not cardiac.  I suspect his syncope is vasomotor and precipitated by prolonged standing and heat exposure. We have discussed the physiology and the importance of volume repletion and upright dependence  For > 40 min

## 2014-04-19 NOTE — Patient Instructions (Signed)
Your physician recommends that you schedule a follow-up appointment in: 4 months with Dr [name]

## 2014-04-24 ENCOUNTER — Encounter: Payer: Self-pay | Admitting: Internal Medicine

## 2014-05-23 ENCOUNTER — Other Ambulatory Visit: Payer: Self-pay | Admitting: Internal Medicine

## 2014-05-29 NOTE — Telephone Encounter (Signed)
This encounter was created in error - please disregard.

## 2014-06-25 ENCOUNTER — Ambulatory Visit (INDEPENDENT_AMBULATORY_CARE_PROVIDER_SITE_OTHER): Payer: Medicaid Other | Admitting: Pulmonary Disease

## 2014-06-25 ENCOUNTER — Encounter: Payer: Self-pay | Admitting: Pulmonary Disease

## 2014-06-25 ENCOUNTER — Telehealth: Payer: Self-pay | Admitting: Pulmonary Disease

## 2014-06-25 VITALS — BP 102/68 | HR 56 | Temp 98.0°F | Ht 71.0 in | Wt 264.8 lb

## 2014-06-25 DIAGNOSIS — G4733 Obstructive sleep apnea (adult) (pediatric): Secondary | ICD-10-CM

## 2014-06-25 NOTE — Progress Notes (Signed)
   Subjective:    Patient ID: [name], male    DOB: October 10, 1960, 53 y.o.   MRN: [id number]  HPI Patient comes in today for follow-up of his obstructive sleep apnea. He is wearing C Pap compliantly, and is having no issues with his mask fit or pressure. He is having some issues with dryness as well as soreness in his left nostril. He is using his humidifier, but is unsure if he has a heated hose. He feels that he sleeps well with the device, and has lost 25 pounds since the last visit.   Review of Systems  Constitutional: Negative for fever and unexpected weight change.  HENT: Negative for congestion, dental problem, ear pain, nosebleeds, postnasal drip, rhinorrhea, sinus pressure, sneezing, sore throat and trouble swallowing.   Eyes: Negative for redness and itching.  Respiratory: Negative for cough, chest tightness, shortness of breath and wheezing.   Cardiovascular: Negative for palpitations and leg swelling.  Gastrointestinal: Negative for nausea and vomiting.  Genitourinary: Negative for dysuria.  Musculoskeletal: Negative for joint swelling.  Skin: Negative for rash.  Neurological: Negative for headaches.  Hematological: Does not bruise/bleed easily.  Psychiatric/Behavioral: Negative for dysphoric mood. The patient is not nervous/anxious.        Objective:   Physical Exam Overweight male in no acute distress Nose without purulence or discharge noted.  Mild excoriation noted anteriorly in the left nostril Neck without lymphadenopathy or thyromegaly No skin breakdown or pressure necrosis from the C Pap mask Lower extremities without edema, no cyanosis Alert and oriented, moves all 4 extremities.       Assessment & Plan:

## 2014-06-25 NOTE — Patient Instructions (Signed)
Stay on cpap, and keep up with mask changes and supplies. Try AYR saline jelly in a tube, and can put in right nostril 3-4 times a day to help with healing. Keep working on weight loss.  You are doing fantastic. Check to see if your tubing is heated (a wire running in it).  If not, let us know.  followup with me again in one year

## 2014-06-25 NOTE — Telephone Encounter (Signed)
Spoke with pt, states he has a sore on his R nostril X1 month, unable to wear cpap.  Sore is raw, not oozing.  Pt last saw [place] on 07/2013.  Pt scheduled for 11:00 today as he requested earliest available and there were a few open slots this morning on his schedule.  Nothing further needed at this time.

## 2014-06-25 NOTE — Assessment & Plan Note (Signed)
The patient is wearing C Pap compliantly and has lost 25 pounds since last visit. His only complaint is that of nasal dryness and also soreness in his left nostril. I have recommended that he turn of the heat on his humidifier, and also to check and see if his C Pap hose is heated.  I have also recommended a saline jelly to help heal the area in his left nostril. Finally, I have congratulated him on his weight loss, and asked him to continue working on this.

## 2014-07-20 ENCOUNTER — Other Ambulatory Visit: Payer: Self-pay | Admitting: Internal Medicine

## 2014-07-29 ENCOUNTER — Other Ambulatory Visit: Payer: Self-pay | Admitting: Internal Medicine

## 2014-08-02 ENCOUNTER — Ambulatory Visit: Payer: Medicaid Other | Admitting: Pulmonary Disease

## 2014-08-21 ENCOUNTER — Encounter: Payer: Self-pay | Admitting: Internal Medicine

## 2014-08-21 ENCOUNTER — Ambulatory Visit (INDEPENDENT_AMBULATORY_CARE_PROVIDER_SITE_OTHER): Payer: Medicaid Other | Admitting: Internal Medicine

## 2014-08-21 VITALS — BP 124/90 | HR 61 | Ht 71.0 in | Wt 268.6 lb

## 2014-08-21 DIAGNOSIS — Z45018 Encounter for adjustment and management of other part of cardiac pacemaker: Secondary | ICD-10-CM

## 2014-08-21 DIAGNOSIS — I495 Sick sinus syndrome: Secondary | ICD-10-CM

## 2014-08-21 DIAGNOSIS — I480 Paroxysmal atrial fibrillation: Secondary | ICD-10-CM

## 2014-08-21 DIAGNOSIS — R55 Syncope and collapse: Secondary | ICD-10-CM

## 2014-08-21 LAB — MDC_IDC_ENUM_SESS_TYPE_INCLINIC
Battery Remaining Longevity: 143 mo
Battery Voltage: 2.79 V
Brady Statistic RA Percent Paced: 13 %
Date Time Interrogation Session: 20151222095340
Lead Channel Impedance Value: 67 Ohm
Lead Channel Pacing Threshold Amplitude: 0.5 V
Lead Channel Pacing Threshold Pulse Width: 0.4 ms
Lead Channel Setting Pacing Amplitude: 2 V
MDC IDC MSMT BATTERY IMPEDANCE: 268 Ohm
MDC IDC MSMT LEADCHNL RA IMPEDANCE VALUE: 456 Ohm
MDC IDC MSMT LEADCHNL RA SENSING INTR AMPL: 2 mV

## 2014-08-21 NOTE — Progress Notes (Signed)
Patient Care Team: [name], MD as PCP - General (Family Medicine) Sable Feil, MD (Gastroenterology)   HPI  [name] is a 53 y.o. male Seen in followup for pacemaker implanted for sinus node dysfunction and paroxysmal atrial arrhythmias which persisted despite dofetilide. He underwent catheter ablation of atrial fibrillation 2012. He had been on dofetilide; this was stopped March 2014.  He has had very little symptomatic atrial fibrillation.   Most recently he had problems with syncope. These episodes of mostly occurred with prolonged outside standing. It occasionally occurred while seated and had been ameliorated by lying down or sitting down. His urine is relatively yellow suggesting some degree of volume completion. He does not generally have orthostatic intolerance.  These episodes are associated with palpitations. He is being referred to a neurologist at [facility].   He has a history of ischemic heart disease with prior non-STEMI possibly type II as there was  no significant obstructive lesions noted. Catheterization was done in 2006 in 2009  showing nonobstructive disease. A Myoview scan 2011 was nonischemic. He also is a history of pulmonary embolism   Echocardiogram 2012 demonstrated mild LV dysfunction 45-50%     Past Medical History  Diagnosis Date  . GERD (gastroesophageal reflux disease)   . HTN (hypertension)   . Obesity   . CAD -nonobstructive     S/P nstemi-type II cath 05/2008 - nonobs. dzs.  . Pulmonary embolism 04/2003    Hx of  . Atrial fibrillation     s/p afib ablation  PVI 5/12 JA  . Sinus node dysfunction     s/p MDT PPM  . Hyperlipemia   . Systolic and diastolic CHF, chronic     EF 35% by most recent echo 09/19/10  . PUD (peptic ulcer disease)     gastritis due to ETOH previously  . Hyperthyroidism     Graves dz on methimazole and followed by [name]  . Polysubstance abuse     cocaine, last used 1999  . Pancreatitis   . Rectal  bleeding   . Hemorrhoids   . Pacemaker-medtronic- AAI     Dual chamber V port plugged   . DDD (degenerative disc disease)     evaluate by neurosurgery is ongoing  . Complication of anesthesia   . PONV (postoperative nausea and vomiting)     2010 after went home -infection  . Anginal pain     freq  goes away quickly  . Depression     ptsd  . Erectile dysfunction   . Shortness of breath     LYING DOWN AT TIMES  . Sleep apnea     uses CPAP [facility] 2 YRS  . Headache(784.0)   . 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 Pacemaker insertion  10/09    by Dr [name]  . Fingers removed from right hand.  2/84    traumatic work injury  . Lumbar spine surgery  02/2009    Dr. Trenton Gammon  . Atrial ablation surgery  12/2010    afib ablation by [name]  . Insert / replace / remove pacemaker      05/2008    . Cardiac catheterization      CADIAC ABLATION [name] 12/2010  . Anterior cervical decomp/discectomy fusion  12/15/2011    Procedure: ANTERIOR CERVICAL DECOMPRESSION/DISCECTOMY FUSION 1 LEVEL;  Surgeon: [name], MD;  Location: [place] [facility];  Service: Neurosurgery;  Laterality: N/A;  Cervical Six-Seven Anterior Cervical Diskectomy fusion with Allograft and Plating    Current Outpatient Prescriptions  Medication Sig Dispense Refill  . Azelastine HCl (ASTEPRO) 0.15 % SOLN Place 2 sprays into the nose as needed.    . carvedilol (COREG) 25 MG tablet Take 25 mg by mouth 2 (two) times daily with a meal.     . clobetasol ointment (TEMOVATE) 8.92 % Apply 1 application topically daily.    . cyclobenzaprine (FLEXERIL) 10 MG tablet Take 10 mg by mouth 3 (three) times daily. For muscle spams    . fluticasone (FLONASE) 50 MCG/ACT nasal spray Place 2 sprays into the nose as needed.     . gabapentin (NEURONTIN) 100 MG capsule Take 100 mg by mouth daily.    [name] KLOR-CON M20 20 MEQ tablet TAKE 1 TABLET BY MOUTH EVERY DAY 30 tablet  1  . losartan (COZAAR) 50 MG tablet TAKE 1 TABLET BY MOUTH DAILY 30 tablet 1  . methimazole (TAPAZOLE) 10 MG tablet Take 1 tablet (10 mg total) by mouth daily. 30 tablet 1  . nitroGLYCERIN (NITROSTAT) 0.4 MG SL tablet Place 0.4 mg under the tongue every 5 (five) minutes as needed. For chest pain    . omeprazole (PRILOSEC) 40 MG capsule Take 40 mg by mouth daily.     [name] oxyCODONE-acetaminophen (PERCOCET) 10-325 MG per tablet Take 1 tablet by mouth every 4 (four) hours as needed for pain. 29 tablet 0  . PRADAXA 150 MG CAPS capsule TAKE 1 CAPSULE (150 MG TOTAL) BY MOUTH EVERY 12 (TWELVE) HOURS. 60 capsule 0  . sildenafil (VIAGRA) 100 MG tablet Take 100 mg by mouth daily as needed. For erectile dysfunction    . simvastatin (ZOCOR) 40 MG tablet TAKE 1 TABLET (40 MG TOTAL) BY MOUTH DAILY. 30 tablet 1  . testosterone (ANDROGEL) 50 MG/5GM GEL Place 5 g onto the skin daily.    . [DISCONTINUED] vardenafil (LEVITRA) 20 MG tablet Take 1 tablet (20 mg total) by mouth daily as needed for erectile dysfunction. 20 tablet 3   No current facility-administered medications for this visit.    Allergies  Allergen Reactions  . Hydrocodone-Acetaminophen Dermatitis  . Celebrex [Celecoxib] Itching  . Hydrocodone Itching  . Prednisolone Itching    Review of Systems negative except from HPI and PMH  Physical Exam BP 124/90 mmHg  Pulse 61  Ht 5\' 11"  (1.803 m)  Wt 268 lb 9.6 oz (121.836 kg)  BMI 37.48 kg/m2 Well developed and well nourished in no acute distress HENT normal E scleral and icterus clear Neck Supple JVP flat; carotids brisk and full Clear to ausculation  Regular rate and rhythm, no murmurs gallops or rub Soft with active bowel sounds No clubbing cyanosis none Edema Alert and oriented, grossly normal motor and sensory function Skin Warm and Dry  ECG shows sinus rhythm at 65 Interval 17/11/46 Frequent PACs  Assessment and  Plan  Syncope  Atrial fibrillation recurrent based on his  device  Sinus node dysfunction  Pacemaker-Medtronic The patient's device was interrogated and the information was fully reviewed.  The device was reprogrammed to  allow recording of his atrial high rate episodes to confirm atrial fibrillation  Chronic pain  Chest pain syndrome He continues to have episodes of tachypalpitations and atrial fibrillation. He also has episodes of syncope associated with epiphenomena suggestive of a neurally mediated phenomenon. The question that   is begged is whether these are triggered  by arrhythmia.  To that end we will use an event recorder.  We will continue him on his dabigitran  he continues to have episodes of atrial fibrillationabigitran

## 2014-08-21 NOTE — Patient Instructions (Signed)
Your physician recommends that you continue on your current medications as directed. Please refer to the Current Medication list given to you today.  Your physician has recommended that you wear an event monitor. Event monitors are medical devices that record the heart's electrical activity. Doctors most often [name] these monitors to diagnose arrhythmias. Arrhythmias are problems with the speed or rhythm of the heartbeat. The monitor is a small, portable device. You can wear one while you do your normal daily activities. This is usually used to diagnose what is causing palpitations/syncope (passing out).  Remote monitoring is used to monitor your Pacemaker of ICD from home. This monitoring reduces the number of office visits required to check your device to one time per year. It allows [name] to keep an eye on the functioning of your device to ensure it is working properly. You are scheduled for a device check from home on 11/20/14. You may send your transmission at any time that day. If you have a wireless device, the transmission will be sent automatically. After your physician reviews your transmission, you will receive a postcard with your next transmission date.  Your physician wants you to follow-up in: 1 year with Dr. [name].  You will receive a reminder letter in the mail two months in advance. If you don't receive a letter, please call our office to schedule the follow-up appointment.

## 2014-08-27 ENCOUNTER — Encounter: Payer: Self-pay | Admitting: *Deleted

## 2014-08-27 ENCOUNTER — Encounter (INDEPENDENT_AMBULATORY_CARE_PROVIDER_SITE_OTHER): Payer: Medicaid Other

## 2014-08-27 DIAGNOSIS — I480 Paroxysmal atrial fibrillation: Secondary | ICD-10-CM

## 2014-08-27 DIAGNOSIS — R55 Syncope and collapse: Secondary | ICD-10-CM | POA: Diagnosis not present

## 2014-08-27 NOTE — Progress Notes (Signed)
Patient ID: [name], male   DOB: 11-16-1960, 53 y.o.   MRN: [id number] [employer] 30 day cardiac event monitor applied to patient.

## 2014-08-29 ENCOUNTER — Other Ambulatory Visit: Payer: Self-pay | Admitting: Internal Medicine

## 2014-08-30 ENCOUNTER — Telehealth: Payer: Self-pay | Admitting: Cardiology

## 2014-08-30 ENCOUNTER — Telehealth: Payer: Self-pay | Admitting: *Deleted

## 2014-08-30 NOTE — Telephone Encounter (Signed)
monitoring company called, pt had a fib with rate up to 200, he did break into SR.  Pt called, I left a message.

## 2014-08-30 NOTE — Telephone Encounter (Signed)
PA for Losartan approved through 08/25/2015. #[id number]. Pharmacy notified. Patient has tried and failed Benazepril.

## 2014-08-31 ENCOUNTER — Telehealth: Payer: Self-pay | Admitting: Cardiology

## 2014-08-31 NOTE — Telephone Encounter (Signed)
Called by [employer] monitoring to state that patient had gone into rapid atrial fibrillation at 160bpm.  Repeat strip showed atrial fibrillation in the 100bpm.  Called patient who is feeling fine now.  He states that he has been having these episodes for 6 months and they are no different now.  He denies any chest pain or SOB.  He is feeling fine now.  I will forward this to Dr. [name].  Instructed patient to call or go to ER if he develops chest pain, SOB, dizziness or syncope.

## 2014-09-03 ENCOUNTER — Telehealth: Payer: Self-pay | Admitting: Internal Medicine

## 2014-09-03 NOTE — Telephone Encounter (Signed)
Patient tells me same symptoms as previously reported - same/similar episodes since event monitor start - at time of event patient feels dizzy, light-headed. Patient denies CP or SOB and states he is feeling fine now. Patient aware I will review this with Dr. [name] tomorrow to determine best course of action in this matter. Advised to go to ER if he develops CP, SOB, dizziness, syncope. Patient verbalized understanding and agreeable to plan.

## 2014-09-03 NOTE — Telephone Encounter (Signed)
[employer] critical report received via phone: Aaron Edelman, from [employer], states at 1:50 pm today event of A Flutter, HR 210. Attempted to reach patient, no answer.

## 2014-09-06 NOTE — Telephone Encounter (Signed)
lmtcb  Need to inform patient when he calls: Monitor reviewed with Dr. [name]. Orders to start Tikosyn and discontinue event monitor.

## 2014-09-07 NOTE — Telephone Encounter (Signed)
Route to [employer]

## 2014-09-07 NOTE — Telephone Encounter (Signed)
This is an [facility] patient

## 2014-09-07 NOTE — Telephone Encounter (Signed)
Patient advised he can discontinue event monitor. Will need to wait till Monday when Dr.Klein/sheri are back in office to start Tikosyn. States he was on it a year ago.

## 2014-09-07 NOTE — Telephone Encounter (Signed)
Follow up  Pt returned call./sr

## 2014-09-10 NOTE — Telephone Encounter (Signed)
Called to confirm patient stopped event monitor. Also discussed re-starting Tikosyn and patient is agreeable to this. He would like to start ASAP. Explained that our pharmacist would be in touch to arrange hospital/Tikosyn loading.  Patient is agreeable.

## 2014-09-11 DIAGNOSIS — R253 Fasciculation: Secondary | ICD-10-CM | POA: Insufficient documentation

## 2014-09-11 DIAGNOSIS — R252 Cramp and spasm: Secondary | ICD-10-CM | POA: Insufficient documentation

## 2014-09-13 ENCOUNTER — Ambulatory Visit (INDEPENDENT_AMBULATORY_CARE_PROVIDER_SITE_OTHER): Payer: Medicaid Other | Admitting: Pharmacist

## 2014-09-13 ENCOUNTER — Inpatient Hospital Stay (HOSPITAL_COMMUNITY)
Admission: AD | Admit: 2014-09-13 | Discharge: 2014-09-16 | DRG: 309 | Disposition: A | Discharge status: Home / Self Care | Payer: Medicaid Other | Source: Ambulatory Visit | Attending: Internal Medicine | Admitting: Internal Medicine

## 2014-09-13 ENCOUNTER — Encounter (HOSPITAL_COMMUNITY): Payer: Self-pay | Admitting: *Deleted

## 2014-09-13 DIAGNOSIS — I10 Essential (primary) hypertension: Secondary | ICD-10-CM | POA: Diagnosis present

## 2014-09-13 DIAGNOSIS — N529 Male erectile dysfunction, unspecified: Secondary | ICD-10-CM | POA: Diagnosis present

## 2014-09-13 DIAGNOSIS — Z950 Presence of cardiac pacemaker: Secondary | ICD-10-CM | POA: Diagnosis present

## 2014-09-13 DIAGNOSIS — I25119 Atherosclerotic heart disease of native coronary artery with unspecified angina pectoris: Secondary | ICD-10-CM | POA: Diagnosis present

## 2014-09-13 DIAGNOSIS — G4733 Obstructive sleep apnea (adult) (pediatric): Secondary | ICD-10-CM | POA: Diagnosis present

## 2014-09-13 DIAGNOSIS — Z7901 Long term (current) use of anticoagulants: Secondary | ICD-10-CM | POA: Diagnosis not present

## 2014-09-13 DIAGNOSIS — I4892 Unspecified atrial flutter: Secondary | ICD-10-CM | POA: Diagnosis present

## 2014-09-13 DIAGNOSIS — I480 Paroxysmal atrial fibrillation: Secondary | ICD-10-CM | POA: Diagnosis present

## 2014-09-13 DIAGNOSIS — I5042 Chronic combined systolic (congestive) and diastolic (congestive) heart failure: Secondary | ICD-10-CM | POA: Diagnosis present

## 2014-09-13 DIAGNOSIS — F4310 Post-traumatic stress disorder, unspecified: Secondary | ICD-10-CM | POA: Diagnosis present

## 2014-09-13 DIAGNOSIS — K219 Gastro-esophageal reflux disease without esophagitis: Secondary | ICD-10-CM | POA: Diagnosis present

## 2014-09-13 DIAGNOSIS — E785 Hyperlipidemia, unspecified: Secondary | ICD-10-CM | POA: Diagnosis present

## 2014-09-13 DIAGNOSIS — R002 Palpitations: Secondary | ICD-10-CM | POA: Diagnosis present

## 2014-09-13 DIAGNOSIS — F329 Major depressive disorder, single episode, unspecified: Secondary | ICD-10-CM | POA: Diagnosis present

## 2014-09-13 DIAGNOSIS — E669 Obesity, unspecified: Secondary | ICD-10-CM | POA: Diagnosis present

## 2014-09-13 DIAGNOSIS — I429 Cardiomyopathy, unspecified: Secondary | ICD-10-CM | POA: Diagnosis present

## 2014-09-13 DIAGNOSIS — Z6837 Body mass index (BMI) 37.0-37.9, adult: Secondary | ICD-10-CM | POA: Diagnosis not present

## 2014-09-13 DIAGNOSIS — Z86711 Personal history of pulmonary embolism: Secondary | ICD-10-CM | POA: Diagnosis not present

## 2014-09-13 DIAGNOSIS — I2510 Atherosclerotic heart disease of native coronary artery without angina pectoris: Secondary | ICD-10-CM | POA: Diagnosis present

## 2014-09-13 DIAGNOSIS — Z888 Allergy status to other drugs, medicaments and biological substances status: Secondary | ICD-10-CM

## 2014-09-13 DIAGNOSIS — I252 Old myocardial infarction: Secondary | ICD-10-CM

## 2014-09-13 DIAGNOSIS — E0590 Thyrotoxicosis, unspecified without thyrotoxic crisis or storm: Secondary | ICD-10-CM | POA: Diagnosis present

## 2014-09-13 DIAGNOSIS — Z79899 Other long term (current) drug therapy: Secondary | ICD-10-CM

## 2014-09-13 DIAGNOSIS — Z87891 Personal history of nicotine dependence: Secondary | ICD-10-CM | POA: Diagnosis not present

## 2014-09-13 LAB — MRSA PCR SCREENING: MRSA by PCR: NEGATIVE

## 2014-09-13 LAB — BASIC METABOLIC PANEL
Anion gap: 7 (ref 5–15)
BUN: 17 mg/dL (ref 6–23)
BUN: 17 mg/dL (ref 6–23)
CO2: 30 meq/L (ref 19–32)
CO2: 26 mmol/L (ref 19–32)
Calcium: 9.5 mg/dL (ref 8.4–10.5)
Calcium: 9.5 mg/dL (ref 8.4–10.5)
Chloride: 104 mEq/L (ref 96–112)
Chloride: 104 mEq/L (ref 96–112)
Creatinine, Ser: 1.05 mg/dL (ref 0.40–1.50)
Creatinine, Ser: 1.14 mg/dL (ref 0.50–1.35)
GFR calc Af Amer: 82 mL/min — ABNORMAL LOW (ref >90)
GFR calc non Af Amer: 71 mL/min — ABNORMAL LOW (ref >90)
GFR: 94.25 mL/min (ref >60.00)
GLUCOSE: 107 mg/dL — AB (ref 70–99)
Glucose, Bld: 94 mg/dL (ref 70–99)
Potassium: 4.3 mEq/L (ref 3.5–5.1)
Potassium: 4.3 mmol/L (ref 3.5–5.1)
Sodium: 137 mEq/L (ref 135–145)
Sodium: 137 mmol/L (ref 135–145)

## 2014-09-13 LAB — TSH: TSH: 1.25 u[IU]/mL (ref 0.35–4.50)

## 2014-09-13 LAB — MAGNESIUM
MAGNESIUM: 2.1 mg/dL (ref 1.5–2.5)
Magnesium: 2.1 mg/dL (ref 1.5–2.5)

## 2014-09-13 LAB — T3: T3, Total: 132.2 ng/dL (ref 80.0–204.0)

## 2014-09-13 LAB — T4: T4, Total: 8.5 ug/dL (ref 4.5–12.0)

## 2014-09-13 MED ORDER — ONDANSETRON HCL 4 MG/2ML IJ SOLN: 4.0000 mg | Freq: Four times a day (QID) | INTRAMUSCULAR | Status: DC | PRN

## 2014-09-13 MED ORDER — SODIUM CHLORIDE 0.9 % IJ SOLN
3.0000 mL | Freq: Two times a day (BID) | INTRAMUSCULAR | Status: DC
  Administered 2014-09-14 – 2014-09-16 (×5): 3 mL via INTRAVENOUS

## 2014-09-13 MED ORDER — CARVEDILOL 25 MG PO TABS
25.0000 mg | ORAL_TABLET | Freq: Two times a day (BID) | ORAL | Status: DC
  Administered 2014-09-13 – 2014-09-16 (×6): 25 mg via ORAL
  Filled 2014-09-13 (×6): qty 1

## 2014-09-13 MED ORDER — AZELASTINE HCL 0.15 % NA SOLN: 2.0000 | NASAL | Status: DC | PRN

## 2014-09-13 MED ORDER — DABIGATRAN ETEXILATE MESYLATE 150 MG PO CAPS
150.0000 mg | ORAL_CAPSULE | Freq: Two times a day (BID) | ORAL | Status: DC
  Administered 2014-09-13 – 2014-09-16 (×6): 150 mg via ORAL
  Filled 2014-09-13 (×7): qty 1

## 2014-09-13 MED ORDER — ZOLPIDEM TARTRATE 5 MG PO TABS: 5.0000 mg | ORAL_TABLET | Freq: Every evening | ORAL | Status: DC | PRN

## 2014-09-13 MED ORDER — OXYCODONE-ACETAMINOPHEN 5-325 MG PO TABS
1.0000 | ORAL_TABLET | ORAL | Status: DC | PRN
  Administered 2014-09-14 – 2014-09-16 (×2): 1 via ORAL
  Filled 2014-09-13 (×2): qty 1

## 2014-09-13 MED ORDER — ALPRAZOLAM 0.25 MG PO TABS: 0.2500 mg | ORAL_TABLET | Freq: Two times a day (BID) | ORAL | Status: DC | PRN

## 2014-09-13 MED ORDER — TESTOSTERONE 50 MG/5GM (1%) TD GEL
5.0000 g | Freq: Every day | TRANSDERMAL | Status: DC
  Filled 2014-09-13 (×2): qty 5

## 2014-09-13 MED ORDER — LOSARTAN POTASSIUM 50 MG PO TABS
50.0000 mg | ORAL_TABLET | Freq: Every day | ORAL | Status: DC
  Administered 2014-09-14 – 2014-09-16 (×3): 50 mg via ORAL
  Filled 2014-09-13 (×4): qty 1

## 2014-09-13 MED ORDER — SODIUM CHLORIDE 0.9 % IV SOLN: 250.0000 mL | INTRAVENOUS | Status: DC | PRN

## 2014-09-13 MED ORDER — FLUTICASONE PROPIONATE 50 MCG/ACT NA SUSP: 2.0000 | NASAL | Status: DC | PRN

## 2014-09-13 MED ORDER — OXYCODONE-ACETAMINOPHEN 10-325 MG PO TABS: 1.0000 | ORAL_TABLET | ORAL | Status: DC | PRN

## 2014-09-13 MED ORDER — PANTOPRAZOLE SODIUM 40 MG PO TBEC
40.0000 mg | DELAYED_RELEASE_TABLET | Freq: Every day | ORAL | Status: DC
  Administered 2014-09-14 – 2014-09-16 (×3): 40 mg via ORAL
  Filled 2014-09-13 (×3): qty 1

## 2014-09-13 MED ORDER — DOFETILIDE 500 MCG PO CAPS
500.0000 ug | ORAL_CAPSULE | Freq: Two times a day (BID) | ORAL | Status: DC
  Administered 2014-09-13 – 2014-09-16 (×6): 500 ug via ORAL
  Filled 2014-09-13 (×6): qty 1

## 2014-09-13 MED ORDER — CYCLOBENZAPRINE HCL 10 MG PO TABS
10.0000 mg | ORAL_TABLET | Freq: Three times a day (TID) | ORAL | Status: DC
  Administered 2014-09-13 – 2014-09-16 (×9): 10 mg via ORAL
  Filled 2014-09-13 (×9): qty 1

## 2014-09-13 MED ORDER — AZELASTINE HCL 0.1 % NA SOLN: 2.0000 | Freq: Two times a day (BID) | NASAL | Status: DC | PRN

## 2014-09-13 MED ORDER — POTASSIUM CHLORIDE CRYS ER 20 MEQ PO TBCR: 20.0000 meq | EXTENDED_RELEASE_TABLET | Freq: Every day | ORAL | Status: DC

## 2014-09-13 MED ORDER — GABAPENTIN 100 MG PO CAPS
100.0000 mg | ORAL_CAPSULE | Freq: Every day | ORAL | Status: DC
  Administered 2014-09-14 – 2014-09-16 (×3): 100 mg via ORAL
  Filled 2014-09-13 (×3): qty 1

## 2014-09-13 MED ORDER — SODIUM CHLORIDE 0.9 % IJ SOLN: 3.0000 mL | INTRAMUSCULAR | Status: DC | PRN

## 2014-09-13 MED ORDER — SIMVASTATIN 40 MG PO TABS
40.0000 mg | ORAL_TABLET | Freq: Every day | ORAL | Status: DC
  Administered 2014-09-13 – 2014-09-15 (×3): 40 mg via ORAL
  Filled 2014-09-13 (×3): qty 1

## 2014-09-13 MED ORDER — OXYCODONE HCL 5 MG PO TABS
5.0000 mg | ORAL_TABLET | ORAL | Status: DC | PRN
  Administered 2014-09-14 – 2014-09-16 (×2): 5 mg via ORAL
  Filled 2014-09-13 (×2): qty 1

## 2014-09-13 MED ORDER — METHIMAZOLE 10 MG PO TABS
10.0000 mg | ORAL_TABLET | Freq: Every day | ORAL | Status: DC
Start: 2014-09-14 — End: 2014-09-16
  Administered 2014-09-14 – 2014-09-16 (×3): 10 mg via ORAL
  Filled 2014-09-13 (×3): qty 1

## 2014-09-13 NOTE — Progress Notes (Signed)
Pt has home CPAP unit, and mask. Provided water.

## 2014-09-13 NOTE — H&P (Signed)
Patient ID: [name] MRN: [id number], DOB/AGE: 56-Nov-1962   Admit date: 09/13/2014   Primary Physician: [name], MD Primary Cardiologist: Dr [name]  HPI:   54 yo pt of Dr. [name] who has previously been treated with Tikosyn 559mcg BID but it was stopped sometime between March and September 2014 for unknown reason. He was last seen by Dr. [name] in December 2015 for follow up. He mentioned issues with syncope and palpitations. Pacemaker interrogation showed episodes of tachypalpitiations and atrial fibrillation. Plan was to order an event monitor to determine if symptoms aligned with arrhythmias. While wearing the monitor, pt had several episodes of afib/flutter with a rate of >160 earlier this month. Plan was to reinitiate Tikosyn. He was seen in the office 09/13/14 by the pharmacist to be set up for [place] admission. He has had no problems in the interm. He is in NSR today, he thinks he has been in NSR the last 48 hrs.    Problem List: Past Medical History  Diagnosis Date  . GERD (gastroesophageal reflux disease)   . HTN (hypertension)   . Obesity   . CAD -nonobstructive     S/P nstemi-type II cath 05/2008 - nonobs. dzs.  . Pulmonary embolism 04/2003    Hx of  . Atrial fibrillation     s/p afib ablation  PVI 5/12 JA  . Sinus node dysfunction     s/p MDT PPM  . Hyperlipemia   . Systolic and diastolic CHF, chronic     EF 35% by most recent echo 09/19/10  . PUD (peptic ulcer disease)     gastritis due to ETOH previously  . Hyperthyroidism     Graves dz on methimazole and followed by [name]  . Polysubstance abuse     cocaine, last used 1999  . Pancreatitis   . Rectal bleeding   . Hemorrhoids   . Pacemaker-medtronic- AAI     Dual chamber V port plugged   . DDD (degenerative disc disease)     evaluate by neurosurgery is ongoing  . Complication of anesthesia   . PONV (postoperative nausea and vomiting)     2010 after went home -infection  . Anginal  pain     freq  goes away quickly  . Depression     ptsd  . Erectile dysfunction   . Shortness of breath     LYING DOWN AT TIMES  . Sleep apnea     uses CPAP [facility] 2 YRS  . Headache(784.0)   . 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 Pacemaker insertion  10/09    by Dr [name]  . Fingers removed from right hand.  2/84    traumatic work injury  . Lumbar spine surgery  02/2009    Dr. Trenton Gammon  . Atrial ablation surgery  12/2010    afib ablation by [name]  . Insert / replace / remove pacemaker      05/2008    . Cardiac catheterization      CADIAC ABLATION [name] 12/2010  . Anterior cervical decomp/discectomy fusion  12/15/2011    Procedure: ANTERIOR CERVICAL DECOMPRESSION/DISCECTOMY FUSION 1 LEVEL;  Surgeon: [name], MD;  Location: [facility];  Service: Neurosurgery;  Laterality: N/A;  Cervical Six-Seven Anterior Cervical Diskectomy fusion with Allograft and Plating     Allergies:  Allergies  Allergen Reactions  .  Hydrocodone-Acetaminophen Dermatitis  . Celebrex [Celecoxib] Itching  . Hydrocodone Itching  . Prednisolone Itching     Home Medications No current facility-administered medications for this encounter.  See Med Rec- He is on Pradaxa   Family History  Problem Relation Age of Onset  . Heart disease Mother   . Breast cancer Mother   . Lung cancer Father   . Diabetes    . Colon cancer Neg Hx      History   Social History  . Marital Status: Legally Separated    Spouse Name: N/A    Number of Children: 7  . Years of Education: N/A   Occupational History  . unemployed    Social History Main Topics  . Smoking status: Former Smoker -- 1.00 packs/day for 15 years    Types: Cigarettes    Quit date: 04/04/2003  . Smokeless tobacco: Never Used  . Alcohol Use: No     Comment: formerly heavy ETOH,  now drinks a fifth of liquor every 2 weeks  . Drug Use: No     Comment:  previously used crack cocaine, quit 1999,  + marijuana  . Sexual Activity: Not on file   Other Topics Concern  . Not on file   Social History Narrative   Unemployed currently.  Previously worked as a Dealer.  Lives in [place].     Review of Systems: General: negative for chills, fever, night sweats or weight changes.  Cardiovascular: negative for chest pain, dyspnea on exertion, edema, orthopnea, palpitations, paroxysmal nocturnal dyspnea or shortness of breath Dermatological: negative for rash Respiratory: negative for cough or wheezing Urologic: negative for hematuria Abdominal: negative for nausea, vomiting, diarrhea, bright red blood per rectum, melena, or hematemesis Neurologic: negative for visual changes, syncope, or dizziness All other systems reviewed and are otherwise negative except as noted above.  Physical Exam: Blood pressure 121/82, pulse 77, temperature 97.9 F (36.6 C), temperature source Oral, resp. rate 18, SpO2 98 %.  General appearance: alert, cooperative, no distress and moderately obese Neck: no carotid bruit and no JVD Lungs: clear to auscultation bilaterally Heart: regular rate and rhythm Abdomen: obese, non tender Extremities: partial amputaion of Rt fingers Pulses: 2+ and symmetric Skin: Skin color, texture, turgor normal. No rashes or lesions Neurologic: Grossly normal    Labs:   Results for orders placed or performed in visit on 09/13/14 (from the past 24 hour(s))  Basic Metabolic Panel (BMET)     Status: Abnormal   Collection Time: 09/13/14 10:07 AM  Result Value Ref Range   Sodium 137 135 - 145 mEq/L   Potassium 4.3 3.5 - 5.1 mEq/L   Chloride 104 96 - 112 mEq/L   CO2 30 19 - 32 mEq/L   Glucose, Bld 107 (H) 70 - 99 mg/dL   BUN 17 6 - 23 mg/dL   Creatinine, Ser 1.05 0.40 - 1.50 mg/dL   Calcium 9.5 8.4 - 10.5 mg/dL   GFR 94.25 >60.00 mL/min  Magnesium     Status: None   Collection Time: 09/13/14 10:07 AM  Result Value Ref Range    Magnesium 2.1 1.5 - 2.5 mg/dL  TSH     Status: None   Collection Time: 09/13/14 10:07 AM  Result Value Ref Range   TSH 1.25 0.35 - 4.50 uIU/mL     Radiology/Studies: No results found.  EKG: Pending  ASSESSMENT AND PLAN:  Active Problems:   PAF (paroxysmal atrial fibrillation)   Obstructive sleep apnea- on C-pap   Chronic  anticoagulation-Pradaxa   Dyslipidemia   PPM-Medtronic- 2009   CAD -nonobstructive   PLAN: Tikosyn load.   Henri Medal, PA-C 09/13/2014, 5:00 PM   EP Attending  Patient seen and examined. I agree with the history, physical exam, asessment and plan as documented by Mr. Rosalyn Gess and minimally amended by me. The patient has had recurrent, symptomatic atrial fib and is admitted for Tikosyn re-initiation. His ECG from several weeks ago is notable for NSR with a corrected QT of around 400 ms. Will start Tikosyn and follow ECG and labs including his renal function and electrolytes.  Mikle Bosworth.D.

## 2014-09-13 NOTE — Progress Notes (Signed)
HPI  [name] is a 54 yo pt of Dr. [name] who is seen today for re-initiation of Tikosyn.  He has previously been treated with Tikosyn 565mcg BID but it was stopped sometime between March and September 2014 for unknown reason.  He was last seen by Dr. [name] in December 2015 for follow up.  He mentioned issues with syncope and palpitations.  Pacemaker interrogation showed episodes of tachypalpitiations and atrial fibrillation.  Plan was to order an event monitor to determine if symptoms aligned with arrhythmias.   While wearing the monitor, pt had several episodes of afib/flutter with a rate of >160.  Plan was to reinitiate Tikosyn.    Pt accompanied with his wife today.  Discussed potential side effects with patient including QTc prolongation.  He is aware to call the office if he misses more than 2 doses in a row.  He is medicare/medicaid so insurance coverage should not be a problem.  Reviewed medication list.  He is currently on no QTc prolongating or contraindicated medications. He is on methimazole and has not had labs done in >1 year.  Unsure if his thyroid function may be contributing to his increase in afib episodes.  He is anticoagulated with Pradaxa and states he has been compliant for the last 30 days.   EKG reviewed by Dr. [name].  Sinus bradycardia with vent rate of 59 bpm.  QTc 372 msec.   Current Outpatient Prescriptions  Medication Sig Dispense Refill  . Azelastine HCl (ASTEPRO) 0.15 % SOLN Place 2 sprays into the nose as needed.    . carvedilol (COREG) 25 MG tablet Take 25 mg by mouth 2 (two) times daily with a meal.     . clobetasol ointment (TEMOVATE) 4.33 % Apply 1 application topically daily.    . cyclobenzaprine (FLEXERIL) 10 MG tablet Take 10 mg by mouth 3 (three) times daily. For muscle spams    . fluticasone (FLONASE) 50 MCG/ACT nasal spray Place 2 sprays into the nose as needed.     . gabapentin (NEURONTIN) 100 MG capsule Take 100 mg by mouth daily.    [name] KLOR-CON M20 20  MEQ tablet TAKE 1 TABLET BY MOUTH EVERY DAY 30 tablet 1  . losartan (COZAAR) 50 MG tablet TAKE 1 TABLET BY MOUTH DAILY 30 tablet 1  . methimazole (TAPAZOLE) 10 MG tablet Take 1 tablet (10 mg total) by mouth daily. 30 tablet 1  . nitroGLYCERIN (NITROSTAT) 0.4 MG SL tablet Place 0.4 mg under the tongue every 5 (five) minutes as needed. For chest pain    . omeprazole (PRILOSEC) 40 MG capsule Take 40 mg by mouth daily.     [name] oxyCODONE-acetaminophen (PERCOCET) 10-325 MG per tablet Take 1 tablet by mouth every 4 (four) hours as needed for pain. 29 tablet 0  . PRADAXA 150 MG CAPS capsule TAKE 1 CAPSULE (150 MG TOTAL) BY MOUTH EVERY 12 (TWELVE) HOURS. 60 capsule 0  . simvastatin (ZOCOR) 40 MG tablet TAKE 1 TABLET (40 MG TOTAL) BY MOUTH DAILY. 30 tablet 1  . testosterone (ANDROGEL) 50 MG/5GM GEL Place 5 g onto the skin daily.    . sildenafil (VIAGRA) 100 MG tablet Take 100 mg by mouth daily as needed. For erectile dysfunction    . [DISCONTINUED] vardenafil (LEVITRA) 20 MG tablet Take 1 tablet (20 mg total) by mouth daily as needed for erectile dysfunction. 20 tablet 3   No current facility-administered medications for this visit.   Allergies  Allergen Reactions  . Hydrocodone-Acetaminophen Dermatitis  .  Celebrex [Celecoxib] Itching  . Hydrocodone Itching  . Prednisolone Itching   Assessment and Plan  1.  Atrial Fibrillation- Pt's K, Mg, and QTc acceptable for starting Tikosyn.  He is aware to report to the hospital for admission.

## 2014-09-13 NOTE — Progress Notes (Signed)
Pharmacy Consult for Dofetilide (Tikosyn) Initiation  Admit Complaint: 54 y.o. male admitted 09/13/2014 with atrial fibrillation to be initiated on dofetilide.   Assessment:  Patient Exclusion Criteria: If any screening criteria checked as "Yes", then  patient  should NOT receive dofetilide until criteria item is corrected. If "Yes" please indicate correction plan.  YES  NO Patient  Exclusion Criteria Correction Plan  []  [x]  Baseline QTc interval is greater than or equal to 440 msec. IF above YES box checked dofetilide contraindicated unless patient has ICD; then may proceed if QTc 500-550 msec or with known ventricular conduction abnormalities may proceed with QTc 550-600 msec. QTc =  431 on EKG   []  [x]  Magnesium level is less than 1.8 mEq/l : Last magnesium:  Lab Results  Component Value Date   MG 2.1 09/13/2014         []  [x]  Potassium level is less than 4 mEq/l : Last potassium:  Lab Results  Component Value Date   K 4.3 09/13/2014         []  [x]  Patient is known or suspected to have a digoxin level greater than 2 ng/ml: No results found for: DIGOXIN    []  [x]  Creatinine clearance less than 20 ml/min (calculated using Cockcroft-Gault, actual body weight and serum creatinine): Estimated Creatinine Clearance: 104.8 mL/min (by C-G formula based on Cr of 1.05).   []  [x]  Patient has received drugs known to prolong the QT intervals within the last 48 hours(phenothiazines, tricyclics or tetracyclic antidepressants, erythromycin, H-1 antihistamines, cisapride, fluoroquinolones, azithromycin). Drugs not listed above may have an, as yet, undetected potential to prolong the QT interval, updated information on QT prolonging agents is available at this website:QT prolonging agents   []  [x]  Patient received a dose of hydrochlorothiazide (Oretic) alone or in any combination including triamterene (Dyazide, Maxzide) in the last 48 hours.   []  [x]  Patient received a medication known to increase  dofetilide plasma concentrations prior to initial dofetilide dose:  . Trimethoprim (Primsol, Proloprim) in the last 36 hours . Verapamil (Calan, Verelan) in the last 36 hours or a sustained release dose in the last 72 hours . Megestrol (Megace) in the last 5 days  . Cimetidine (Tagamet) in the last 6 hours . Ketoconazole (Nizoral) in the last 24 hours . Itraconazole (Sporanox) in the last 48 hours  . Prochlorperazine (Compazine) in the last 36 hours    []  [x]  Patient is known to have a history of torsades de pointes; congenital or acquired long QT syndromes.   []  [x]  Patient has received a Class 1 antiarrhythmic with less than 2 half-lives since last dose. (Disopyramide, Quinidine, Procainamide, Lidocaine, Mexiletine, Flecainide, Propafenone)   []  [x]  Patient has received amiodarone therapy in the past 3 months or amiodarone level is greater than 0.3 ng/ml.    Patient has been appropriately anticoagulated with Pradaxa 150mg  BID.  Ordering provider was confirmed at [email or web address] if they are not listed on the [place] Prescribers list.  Goal of Therapy: Follow renal function, electrolytes, potential drug interactions, and dose adjustment. Provide education and 1 week supply at discharge.  Plan:  [x]   Physician selected initial dose within range recommended for patients level of renal function - will monitor for response.  []   Physician selected initial dose outside of range recommended for patients level of renal function - will discuss if the dose should be altered at this time.   Select One Calculated CrCl  Dose q12h  [x]  > 60 ml/min 500 mcg  []   40-60 ml/min 250 mcg  []  20-40 ml/min 125 mcg   2. Follow up QTc after the first 5 doses, renal function, electrolytes (K & Mg) daily x 3     days, dose adjustment, success of initiation and facilitate 1 week discharge supply as     clinically indicated.  3. Initiate Tikosyn education video (Call [phone] and ask for video #  [id number]).  4. Place Enrollment Form on the chart for discharge supply of dofetilide.   Brain Hilts 5:47 PM 09/13/2014

## 2014-09-14 ENCOUNTER — Other Ambulatory Visit: Payer: Self-pay

## 2014-09-14 LAB — BASIC METABOLIC PANEL
Anion gap: 7 (ref 5–15)
BUN: 13 mg/dL (ref 6–23)
CO2: 27 mmol/L (ref 19–32)
Calcium: 9.5 mg/dL (ref 8.4–10.5)
Chloride: 104 mEq/L (ref 96–112)
Creatinine, Ser: 1.02 mg/dL (ref 0.50–1.35)
GFR calc Af Amer: >90 mL/min (ref >90)
GFR calc non Af Amer: 81 mL/min — ABNORMAL LOW (ref >90)
Glucose, Bld: 94 mg/dL (ref 70–99)
Potassium: 4 mmol/L (ref 3.5–5.1)
Sodium: 138 mmol/L (ref 135–145)

## 2014-09-14 LAB — MAGNESIUM: Magnesium: 2.1 mg/dL (ref 1.5–2.5)

## 2014-09-14 MED ORDER — POTASSIUM CHLORIDE CRYS ER 20 MEQ PO TBCR
20.0000 meq | EXTENDED_RELEASE_TABLET | Freq: Every day | ORAL | Status: DC
  Administered 2014-09-14 – 2014-09-16 (×3): 20 meq via ORAL
  Filled 2014-09-14: qty 1
  Filled 2014-09-14: qty 2
  Filled 2014-09-14: qty 1

## 2014-09-14 NOTE — Progress Notes (Signed)
Pt has home CPAP machine and will place himself on when ready.

## 2014-09-14 NOTE — Progress Notes (Signed)
UR Completed Lyzbeth Genrich Graves-Bigelow, RN,BSN [phone]  

## 2014-09-14 NOTE — Progress Notes (Signed)
Pt states he has never had MRSA. Swab was negative.

## 2014-09-14 NOTE — Progress Notes (Signed)
    SUBJECTIVE: The patient is doing well today.  At this time, he denies chest pain, shortness of breath, or any new concerns.  Admitted 09-13-14 for Tikosyn load.  QTc, BMET, Mg stable.   CURRENT MEDICATIONS: . carvedilol  25 mg Oral BID WC  . cyclobenzaprine  10 mg Oral TID  . dabigatran  150 mg Oral Q12H  . dofetilide  500 mcg Oral BID  . gabapentin  100 mg Oral Daily  . losartan  50 mg Oral Daily  . methimazole  10 mg Oral Daily  . pantoprazole  40 mg Oral Daily  . potassium chloride SA  20 mEq Oral Daily  . simvastatin  40 mg Oral q1800  . sodium chloride  3 mL Intravenous Q12H  . testosterone  5 g Transdermal Daily      OBJECTIVE: Physical Exam: Filed Vitals:   09/13/14 1600 09/13/14 1700 09/14/14 0529  BP: 121/82  128/91  Pulse: 77  60  Temp: 97.9 F (36.6 C)  98.2 F (36.8 C)  TempSrc: Oral  Axillary  Resp: 18  18  Height:  5\' 11"  (1.803 m)   Weight:  264 lb 11.2 oz (120.067 kg) 263 lb 1.6 oz (119.341 kg)  SpO2: 98%  98%    Intake/Output Summary (Last 24 hours) at 09/14/14 0737 Last data filed at 09/14/14 0533  Gross per 24 hour  Intake   1440 ml  Output      0 ml  Net   1440 ml    Telemetry reveals sinus rhythm, intermittent atrial pacing with intrinsic ventricular conduction  Physical Exam: Well developed and nourished in no acute distress HENT normal Neck supple with JVP-flat Clear Regular rate and rhythm, no murmurs or gallops Abd-soft with active BS No Clubbing cyanosis edema Skin-warm and dry A & Oriented  Grossly normal sensory and motor function   ECG  QTc 454  LABS: Basic Metabolic Panel:  Recent Labs  09/13/14 1706 09/14/14 0550  NA 137 138  K 4.3 4.0  CL 104 104  CO2 26 27  GLUCOSE 94 94  BUN 17 13  CREATININE 1.14 1.02  CALCIUM 9.5 9.5  MG 2.1 2.1   Thyroid Function Tests:  Recent Labs  09/13/14 1007  TSH 1.25  T4TOTAL 8.5    ASSESSMENT AND PLAN:  Active Problems:   Dyslipidemia   Obstructive sleep apnea-  on C-pap   PAF (paroxysmal atrial fibrillation)   PPM-Medtronic- 2009   CAD -nonobstructive   Chronic anticoagulation-Pradaxa   Continue dofetilide  Anticipate discharge on Sunday

## 2014-09-14 NOTE — Care Management Note (Addendum)
    Page 1 of 1   09/14/2014     11:10:36 AM CARE MANAGEMENT NOTE 09/14/2014  Patient:  [name], [name]   Account Number:  [id number]  Date Initiated:  09/14/2014  Documentation initiated by:  GRAVES-BIGELOW,Freddi Forster  Subjective/Objective Assessment:   Pt admitted for Tikosyn Load.     Action/Plan:   CM did call [facility] and they are in the [employer]. CM to assist with 7 day supply of Tikosyn. MD please write Rx for 7 day supply on d/c day. Preauthorization is needed.   Anticipated DC Date:  09/16/2014   Anticipated DC Plan:  [place]  CM consult      Choice offered to / List presented to:             Status of service:  Completed, signed off Medicare Important Message given?  NO (If response is "NO", the following Medicare IM given date fields will be blank) Date Medicare IM given:   Medicare IM given by:   Date Additional Medicare IM given:   Additional Medicare IM given by:    Discharge Disposition:  HOME/SELF CARE  Per UR Regulation:  Reviewed for med. necessity/level of care/duration of stay  If discussed at [place] of Stay Meetings, dates discussed:    Comments:  09-14-14 [address] RN,BSN [phone] Pt previously on Pradaxa. No needs from CM.

## 2014-09-15 LAB — CBC
HCT: 44.6 % (ref 39.0–52.0)
Hemoglobin: 14.8 g/dL (ref 13.0–17.0)
MCH: 27.4 pg (ref 26.0–34.0)
MCHC: 33.2 g/dL (ref 30.0–36.0)
MCV: 82.4 fL (ref 78.0–100.0)
PLATELETS: 216 10*3/uL (ref 150–400)
RBC: 5.41 MIL/uL (ref 4.22–5.81)
RDW: 16.3 % — ABNORMAL HIGH (ref 11.5–15.5)
WBC: 5.3 10*3/uL (ref 4.0–10.5)

## 2014-09-15 LAB — BASIC METABOLIC PANEL
Anion gap: 5 (ref 5–15)
BUN: 13 mg/dL (ref 6–23)
CO2: 29 mmol/L (ref 19–32)
Calcium: 9 mg/dL (ref 8.4–10.5)
Chloride: 103 mEq/L (ref 96–112)
Creatinine, Ser: 0.97 mg/dL (ref 0.50–1.35)
GFR calc Af Amer: >90 mL/min (ref >90)
GFR calc non Af Amer: >90 mL/min (ref >90)
Glucose, Bld: 91 mg/dL (ref 70–99)
Potassium: 4 mmol/L (ref 3.5–5.1)
Sodium: 137 mmol/L (ref 135–145)

## 2014-09-15 LAB — MAGNESIUM: Magnesium: 2 mg/dL (ref 1.5–2.5)

## 2014-09-15 NOTE — Progress Notes (Signed)
RT entered room to place patient on CPAP. Patient has home CPAP and stated he places himself on before going to sleep.

## 2014-09-15 NOTE — Progress Notes (Signed)
    SUBJECTIVE: The patient is doing well today.  At this time, he denies chest pain, shortness of breath, or any new concerns.  Admitted 09-13-14 for Tikosyn load.  QTc, BMET, Mg stable.   CURRENT MEDICATIONS: . carvedilol  25 mg Oral BID WC  . cyclobenzaprine  10 mg Oral TID  . dabigatran  150 mg Oral Q12H  . dofetilide  500 mcg Oral BID  . gabapentin  100 mg Oral Daily  . losartan  50 mg Oral Daily  . methimazole  10 mg Oral Daily  . pantoprazole  40 mg Oral Daily  . potassium chloride SA  20 mEq Oral Daily  . simvastatin  40 mg Oral q1800  . sodium chloride  3 mL Intravenous Q12H  . testosterone  5 g Transdermal Daily      OBJECTIVE: Physical Exam: Filed Vitals:   09/14/14 0811 09/14/14 1457 09/14/14 2100 09/15/14 0500  BP: 118/81 114/83 123/84 129/86  Pulse: 64 59 67 72  Temp: 98.5 F (36.9 C) 98 F (36.7 C) 98.2 F (36.8 C) 97.7 F (36.5 C)  TempSrc: Oral  Oral Oral  Resp:  18 18 18   Height:      Weight:      SpO2: 99% 98% 95% 99%    Intake/Output Summary (Last 24 hours) at 09/15/14 0912 Last data filed at 09/14/14 1800  Gross per 24 hour  Intake    960 ml  Output      0 ml  Net    960 ml    Telemetry reveals sinus rhythm, intermittent atrial pacing with intrinsic ventricular conduction  Physical Exam: Well developed and nourished in no acute distress HENT normal Neck supple with JVP-flat Clear Regular rate and rhythm, no murmurs or gallops Abd-soft with active BS No Clubbing cyanosis edema Skin-warm and dry A & Oriented  Grossly normal sensory and motor function   ECG  QTc 490 LABS: Basic Metabolic Panel:  Recent Labs  09/14/14 0550 09/15/14 0525  NA 138 137  K 4.0 4.0  CL 104 103  CO2 27 29  GLUCOSE 94 91  BUN 13 13  CREATININE 1.02 0.97  CALCIUM 9.5 9.0  MG 2.1 2.0   Thyroid Function Tests:  Recent Labs  09/13/14 1007  TSH 1.25  T4TOTAL 8.5    ASSESSMENT AND PLAN:  Active Problems:   Dyslipidemia   Obstructive sleep  apnea- on C-pap   PAF (paroxysmal atrial fibrillation)   PPM-Medtronic- 2009   CAD -nonobstructive   Chronic anticoagulation-Pradaxa   Continue dofetilide  Anticipate discharge on Sunday

## 2014-09-16 ENCOUNTER — Other Ambulatory Visit: Payer: Self-pay | Admitting: Internal Medicine

## 2014-09-16 ENCOUNTER — Other Ambulatory Visit: Payer: Self-pay | Admitting: Physician Assistant

## 2014-09-16 DIAGNOSIS — Z79899 Other long term (current) drug therapy: Secondary | ICD-10-CM

## 2014-09-16 DIAGNOSIS — I2510 Atherosclerotic heart disease of native coronary artery without angina pectoris: Secondary | ICD-10-CM

## 2014-09-16 DIAGNOSIS — I480 Paroxysmal atrial fibrillation: Secondary | ICD-10-CM

## 2014-09-16 DIAGNOSIS — Z950 Presence of cardiac pacemaker: Secondary | ICD-10-CM

## 2014-09-16 LAB — BASIC METABOLIC PANEL
Anion gap: 5 (ref 5–15)
BUN: 12 mg/dL (ref 6–23)
CO2: 29 mmol/L (ref 19–32)
Calcium: 9.2 mg/dL (ref 8.4–10.5)
Chloride: 102 mEq/L (ref 96–112)
Creatinine, Ser: 1.07 mg/dL (ref 0.50–1.35)
GFR calc Af Amer: 88 mL/min — ABNORMAL LOW (ref >90)
GFR calc non Af Amer: 76 mL/min — ABNORMAL LOW (ref >90)
Glucose, Bld: 111 mg/dL — ABNORMAL HIGH (ref 70–99)
Potassium: 3.9 mmol/L (ref 3.5–5.1)
Sodium: 136 mmol/L (ref 135–145)

## 2014-09-16 LAB — MAGNESIUM: Magnesium: 2.1 mg/dL (ref 1.5–2.5)

## 2014-09-16 MED ORDER — DOFETILIDE 500 MCG PO CAPS: 500.0000 ug | ORAL_CAPSULE | Freq: Two times a day (BID) | ORAL | Status: DC

## 2014-09-16 NOTE — Discharge Summary (Signed)
Discharge Summary   Patient ID: [name], MRN: [id number], DOB/AGE: 1961-07-01 54 y.o.  Admit date: 09/13/2014 Discharge date: 09/16/2014   Primary Care Physician:  ANDY,CAMILLE L   Primary Cardiologist/Electrophysiologist:  Dr. Virl Axe   Reason for Admission:  Tikosyn Load   Primary Discharge Diagnoses:  Principal Problem:   PAF (paroxysmal atrial fibrillation) Active Problems:   High risk medication use - Tikosyn   Dyslipidemia   Obstructive sleep apnea- on C-pap   PPM-Medtronic- 2009   CAD -nonobstructive   Chronic anticoagulation-Pradaxa     Wt Readings from Last 3 Encounters:  09/16/14 266 lb 11.2 oz (120.974 kg)  08/21/14 268 lb 9.6 oz (121.836 kg)  06/25/14 264 lb 12.8 oz (120.112 kg)    Secondary Discharge Diagnoses:   Past Medical History  Diagnosis Date  . GERD (gastroesophageal reflux disease)   . HTN (hypertension)   . Obesity   . CAD -nonobstructive     S/P nstemi-type II cath 05/2008 - nonobs. dzs.  . Pulmonary embolism 04/2003    Hx of  . Atrial fibrillation     s/p afib ablation  PVI 5/12 JA  . Sinus node dysfunction     s/p MDT PPM  . Hyperlipemia   . Systolic and diastolic CHF, chronic     EF 35% by most recent echo 09/19/10  . PUD (peptic ulcer disease)     gastritis due to ETOH previously  . Hyperthyroidism     Graves dz on methimazole and followed by [name]  . Polysubstance abuse     cocaine, last used 1999  . Pancreatitis   . Rectal bleeding   . Hemorrhoids   . Pacemaker-medtronic- AAI     Dual chamber V port plugged   . DDD (degenerative disc disease)     evaluate by neurosurgery is ongoing  . Complication of anesthesia   . PONV (postoperative nausea and vomiting)     2010 after went home -infection  . Anginal pain     freq  goes away quickly  . Depression     ptsd  . Erectile dysfunction   . Shortness of breath     LYING DOWN AT TIMES  . Sleep apnea     uses CPAP [facility] 2 YRS  .  Headache(784.0)   . 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 Hydrocodone-Acetaminophen Dermatitis    Can take plain Tylenol  . Celebrex [Celecoxib] Itching  . Hydrocodone Itching  . Prednisolone Itching      Procedures Performed This Admission:   None   Hospital Course:  [name] is a 54 y.o. male with a history of paroxysmal atrial fibrillation status post prior ablation, cardiomyopathy, combined systolic and diastolic CHF, hyperlipidemia. He has had recurrent atrial fibrillation and atrial flutter. He had previously been on Tikosyn but this was stopped for unknown reasons. Recent interrogation of his device demonstrated recurrent atrial fibrillation and atrial flutter with heart rates greater than 160. He was felt to be symptomatic and he was set up for admission to the hospital for Tikosyn load. He has received a total of 6 doses of Tikosyn. His QTc interval has remained stable. He has remained asymptomatic. He was seen by Dr. Marlou Porch this morning and felt to be stable for discharge to home. He does have Medicaid. He will need prior authorization so that he  can continue to receive his Tikosyn. I have sent a message to our office to make sure that this is arranged. He will also be provided with a prescription for a 7 day supply today that he will receive from our hospital pharmacy. He will need follow-up in the cardiovascular risk reduction clinic in one week to obtain an ECG, BMET and magnesium. Follow-up with Dr. [name] will also be arranged.  ECG 09/16/2014 10:37 AM: NSR, HR 58, QTc 437 ms   Discharge Vitals:   Blood pressure 127/98, pulse 62, temperature 97.6 F (36.4 C), temperature source Oral, resp. rate 18, height 5\' 11"  (1.803 m), weight 266 lb 11.2 oz (120.974 kg), SpO2 95 %.   Labs:   Recent Labs  09/15/14 0525  WBC 5.3  HGB 14.8  HCT 44.6  MCV 82.4  PLT 216     Recent Labs   09/14/14 0550 09/15/14 0525 09/16/14 0456  NA 138 137 136  K 4.0 4.0 3.9  CL 104 103 102  CO2 27 29 29   BUN 13 13 12   CREATININE 1.02 0.97 1.07  CALCIUM 9.5 9.0 9.2     Lab Results  Component Value Date   TSH 1.25 09/13/2014     Diagnostic Procedures and Studies:  No results found.   Disposition:   Pt is being discharged home today in good condition.  Follow-up Plans & Appointments      Follow-up Information    Follow up with [facility] In 1 week.   Why:  the office will call to arrange a follow up with Elberta Leatherwood, PharmD; you will need: ECG, labs (BMET, Magnesium)   Contact information:   [place] [phone]       Follow up with Virl Axe, MD In 4 weeks.   Specialty:  Cardiology   Why:  the office will call to arrange   Contact information:   [address] [place] [address] [phone]       Discharge Medications    Medication List    TAKE these medications        ASTEPRO 0.15 % Soln  Generic drug:  Azelastine HCl  Place 2 sprays into both nostrils daily as needed (allergies).     carvedilol 25 MG tablet  Commonly known as:  COREG  Take 25 mg by mouth 2 (two) times daily with a meal.     clobetasol ointment 0.05 %  Commonly known as:  TEMOVATE  Apply 1 application topically daily as needed (rash).     cyclobenzaprine 10 MG tablet  Commonly known as:  FLEXERIL  Take 10 mg by mouth 2 (two) times daily. For muscle spams     dofetilide 500 MCG capsule  Commonly known as:  TIKOSYN  Take 1 capsule (500 mcg total) by mouth 2 (two) times daily.     fluticasone 50 MCG/ACT nasal spray  Commonly known as:  FLONASE  Place 2 sprays into both nostrils daily as needed for allergies.     gabapentin 100 MG capsule  Commonly known as:  NEURONTIN  Take 100 mg by mouth every morning.     KLOR-CON M20 20 MEQ tablet  Generic drug:  potassium chloride SA  TAKE 1 TABLET BY MOUTH  EVERY DAY     losartan 50 MG tablet  Commonly known as:  COZAAR  TAKE 1 TABLET BY MOUTH DAILY     methimazole 10 MG tablet  Commonly known as:  TAPAZOLE  Take 1 tablet (10 mg total) by mouth daily.     nitroGLYCERIN 0.4 MG SL tablet  Commonly known as:  NITROSTAT  Place 0.4 mg under the tongue every 5 (five) minutes as needed. For chest pain     omeprazole 40 MG capsule  Commonly known as:  PRILOSEC  Take 40 mg by mouth daily.     oxyCODONE-acetaminophen 10-325 MG per tablet  Commonly known as:  PERCOCET  Take 1 tablet by mouth every 4 (four) hours as needed for pain.     PRADAXA 150 MG Caps capsule  Generic drug:  dabigatran  TAKE 1 CAPSULE (150 MG TOTAL) BY MOUTH EVERY 12 (TWELVE) HOURS.     sildenafil 100 MG tablet  Commonly known as:  VIAGRA  Take 100 mg by mouth daily as needed. For erectile dysfunction     simvastatin 40 MG tablet  Commonly known as:  ZOCOR  TAKE 1 TABLET (40 MG TOTAL) BY MOUTH DAILY.     testosterone 50 MG/5GM (1%) Gel  Commonly known as:  ANDROGEL  Place 5 g onto the skin daily.         Outstanding Labs/Studies  1. BMET, Mg2+, ECG in 1 week.   Duration of Discharge Encounter: Greater than 30 minutes including physician and PA time.  Signed, Richardson Dopp, PA-C   09/16/2014 10:37 AM

## 2014-09-16 NOTE — Progress Notes (Signed)
Primary cardiologist:  Dr. Virl Axe   Subjective:    [name] is a 54 y.o. male admitted for Tikosyn load.  Tikosyn had previously been stopped. Interrogation of his device demonstrated episodes of atrial fibrillation and atrial flutter with heart rates greater than 160.  He has received his sixth dose of Tikosyn this morning.  Awaiting final ECG. Prior Qtc 490  Objective:   Temp:  [97.6 F (36.4 C)-98.3 F (36.8 C)] 97.6 F (36.4 C) (01/17 0640) Pulse Rate:  [62-70] 62 (01/17 0640) Resp:  [16-20] 18 (01/17 0640) BP: (103-133)/(79-98) 127/98 mmHg (01/17 0800) SpO2:  [95 %-96 %] 95 % (01/17 0640) Weight:  [266 lb 11.2 oz (120.974 kg)] 266 lb 11.2 oz (120.974 kg) (01/17 0640) Last BM Date: 09/14/14  Filed Weights   09/13/14 1700 09/14/14 0529 09/16/14 0640  Weight: 264 lb 11.2 oz (120.067 kg) 263 lb 1.6 oz (119.341 kg) 266 lb 11.2 oz (120.974 kg)    Intake/Output Summary (Last 24 hours) at 09/16/14 1001 Last data filed at 09/16/14 0845  Gross per 24 hour  Intake    480 ml  Output      0 ml  Net    480 ml    Telemetry:  SR with PAC, no AFIB  Exam:  General:  AAO x 3  HEENT:  normal  Lungs:  CTAB  Cardiac:  RRR no ectopy  Abdomen:  Soft, NT, +BS obese  Extremities:  No edema  Lab Results:  Basic Metabolic Panel:  Recent Labs Lab 09/14/14 0550 09/15/14 0525 09/16/14 0456  NA 138 137 136  K 4.0 4.0 3.9  CL 104 103 102  CO2 27 29 29   GLUCOSE 94 91 111*  BUN 13 13 12   CREATININE 1.02 0.97 1.07  CALCIUM 9.5 9.0 9.2  MG 2.1 2.0 2.1    Liver Function Tests: No results for input(s): AST, ALT, ALKPHOS, BILITOT, PROT, ALBUMIN in the last 168 hours.  CBC:  Recent Labs Lab 09/15/14 0525  WBC 5.3  HGB 14.8  HCT 44.6  MCV 82.4  PLT 216    Cardiac Enzymes: No results for input(s): CKTOTAL, CKMB, CKMBINDEX, TROPONINI in the last 168 hours.  BNP: No results for input(s): PROBNP in the last 8760 hours.  Coagulation: No results  for input(s): INR in the last 168 hours.    Medications:   Scheduled Medications: . carvedilol  25 mg Oral BID WC  . cyclobenzaprine  10 mg Oral TID  . dabigatran  150 mg Oral Q12H  . dofetilide  500 mcg Oral BID  . gabapentin  100 mg Oral Daily  . losartan  50 mg Oral Daily  . methimazole  10 mg Oral Daily  . pantoprazole  40 mg Oral Daily  . potassium chloride SA  20 mEq Oral Daily  . simvastatin  40 mg Oral q1800  . sodium chloride  3 mL Intravenous Q12H  . testosterone  5 g Transdermal Daily     Infusions:     PRN Medications:  sodium chloride, ALPRAZolam, azelastine, fluticasone, ondansetron (ZOFRAN) IV, oxyCODONE-acetaminophen **AND** oxyCODONE, sodium chloride, zolpidem   Assessment:   Principal Problem:   PAF (paroxysmal atrial fibrillation) Active Problems:   High risk medication use - Tikosyn   Dyslipidemia   Obstructive sleep apnea- on C-pap   PPM-Medtronic- 2009   CAD -nonobstructive   Chronic anticoagulation-Pradaxa     Plan/Discussion:    Tikosyn load for PAF QTc has been within range OK  for DC today if ECG OK Discussed with patient.  Medicaid approval process   Candee Furbish MD 09/16/2014 10:01 AM

## 2014-09-16 NOTE — Discharge Instructions (Signed)
The office will call to arrange prior authorization with your insurance to get your Tikosyn approved.

## 2014-09-17 ENCOUNTER — Other Ambulatory Visit: Payer: Self-pay

## 2014-09-17 MED ORDER — DOFETILIDE 500 MCG PO CAPS: 500.0000 ug | ORAL_CAPSULE | Freq: Two times a day (BID) | ORAL | Status: DC

## 2014-09-17 MED ORDER — DABIGATRAN ETEXILATE MESYLATE 150 MG PO CAPS: ORAL_CAPSULE | ORAL | Status: DC

## 2014-09-24 ENCOUNTER — Ambulatory Visit: Payer: Medicaid Other | Admitting: Pharmacist

## 2014-09-26 ENCOUNTER — Encounter: Payer: Self-pay | Admitting: Cardiovascular Disease

## 2014-09-26 ENCOUNTER — Ambulatory Visit (INDEPENDENT_AMBULATORY_CARE_PROVIDER_SITE_OTHER): Payer: Medicaid Other | Admitting: Cardiovascular Disease

## 2014-09-26 DIAGNOSIS — Z79899 Other long term (current) drug therapy: Secondary | ICD-10-CM

## 2014-09-26 DIAGNOSIS — I480 Paroxysmal atrial fibrillation: Secondary | ICD-10-CM

## 2014-09-26 LAB — BASIC METABOLIC PANEL
BUN: 19 mg/dL (ref 6–23)
CO2: 27 meq/L (ref 19–32)
Calcium: 9.1 mg/dL (ref 8.4–10.5)
Chloride: 104 mEq/L (ref 96–112)
Creatinine, Ser: 0.95 mg/dL (ref 0.40–1.50)
GFR: 105.77 mL/min (ref >60.00)
Glucose, Bld: 96 mg/dL (ref 70–99)
Potassium: 4.1 mEq/L (ref 3.5–5.1)
Sodium: 136 mEq/L (ref 135–145)

## 2014-09-26 NOTE — Progress Notes (Signed)
Patient in today for labs and EKG for 2 week post Tikosyn re-initiation. Patient denies palpitations or syncope. States he feels "fine". Denies complaints or concerns at this time. EKG reviewed by Dr. Burt Knack. Patient escorted to labs for bloodwork.

## 2014-09-27 LAB — MAGNESIUM: Magnesium: 2 mg/dL (ref 1.5–2.5)

## 2014-09-28 ENCOUNTER — Telehealth: Payer: Self-pay | Admitting: *Deleted

## 2014-09-28 NOTE — Telephone Encounter (Signed)
pt notified about lab results with verbal understanding  

## 2014-10-02 ENCOUNTER — Telehealth: Payer: Self-pay | Admitting: *Deleted

## 2014-10-02 NOTE — Telephone Encounter (Signed)
PA for Tikosyn approved through 09/15/15. PA# [phone]

## 2014-10-08 ENCOUNTER — Encounter: Payer: Medicaid Other | Admitting: Internal Medicine

## 2014-10-09 ENCOUNTER — Encounter: Payer: Self-pay | Admitting: *Deleted

## 2014-10-09 ENCOUNTER — Other Ambulatory Visit: Payer: Self-pay | Admitting: Internal Medicine

## 2014-10-09 NOTE — Progress Notes (Unsigned)
Patient ID: [name], male   DOB: 1961/07/19, 54 y.o.   MRN: [id number] Patient's event monitor showed Recurrent SVT, probably Afib/Aflutter - per Dr. Olin Pia note.   (this is prior to restarting Tikosyn) Patient no showed to scheduled appt yesterday 2/8. Patient has rescheduled in March.

## 2014-10-11 ENCOUNTER — Other Ambulatory Visit: Payer: Self-pay | Admitting: Endocrinology

## 2014-10-11 ENCOUNTER — Other Ambulatory Visit: Payer: Self-pay | Admitting: Internal Medicine

## 2014-10-12 ENCOUNTER — Other Ambulatory Visit: Payer: Self-pay

## 2014-10-12 MED ORDER — CARVEDILOL 25 MG PO TABS: 25.0000 mg | ORAL_TABLET | Freq: Two times a day (BID) | ORAL | Status: DC

## 2014-11-05 ENCOUNTER — Other Ambulatory Visit: Payer: Self-pay | Admitting: Endocrinology

## 2014-11-06 ENCOUNTER — Other Ambulatory Visit: Payer: Self-pay | Admitting: *Deleted

## 2014-11-06 MED ORDER — METHIMAZOLE 10 MG PO TABS: 10.0000 mg | ORAL_TABLET | Freq: Every day | ORAL | Status: DC

## 2014-11-20 ENCOUNTER — Encounter: Payer: Self-pay | Admitting: Internal Medicine

## 2014-11-20 ENCOUNTER — Ambulatory Visit (INDEPENDENT_AMBULATORY_CARE_PROVIDER_SITE_OTHER): Payer: Medicaid Other | Admitting: Internal Medicine

## 2014-11-20 VITALS — BP 120/80 | HR 64 | Ht 71.0 in | Wt 277.4 lb

## 2014-11-20 DIAGNOSIS — I480 Paroxysmal atrial fibrillation: Secondary | ICD-10-CM | POA: Diagnosis not present

## 2014-11-20 LAB — MDC_IDC_ENUM_SESS_TYPE_INCLINIC
Battery Impedance: 291 Ohm
Brady Statistic RA Percent Paced: 6 %
Date Time Interrogation Session: 20160322132800
Lead Channel Impedance Value: 462 Ohm
Lead Channel Impedance Value: 67 Ohm
Lead Channel Pacing Threshold Amplitude: 0.5 V
Lead Channel Pacing Threshold Pulse Width: 0.4 ms
MDC IDC MSMT BATTERY REMAINING LONGEVITY: 141 mo
MDC IDC MSMT BATTERY VOLTAGE: 2.79 V
MDC IDC MSMT LEADCHNL RA SENSING INTR AMPL: 2.8 mV
MDC IDC SET LEADCHNL RA PACING AMPLITUDE: 2 V

## 2014-11-20 MED ORDER — METOPROLOL TARTRATE 25 MG PO TABS: 25.0000 mg | ORAL_TABLET | Freq: Two times a day (BID) | ORAL | Status: DC

## 2014-11-20 NOTE — Patient Instructions (Addendum)
Your physician has recommended you make the following change in your medication:  1) STOP Losartan 2) START Metoprolol 25 mg twice daily  Remote monitoring is used to monitor your Pacemaker of ICD from home. This monitoring reduces the number of office visits required to check your device to one time per year. It allows [name] to keep an eye on the functioning of your device to ensure it is working properly. You are scheduled for a device check from home on 02/19/15. You may send your transmission at any time that day. If you have a wireless device, the transmission will be sent automatically. After your physician reviews your transmission, you will receive a postcard with your next transmission date.  Your physician has requested that you have an echocardiogram. Echocardiography is a painless test that uses sound waves to create images of your heart. It provides your doctor with information about the size and shape of your heart and how well your heart's chambers and valves are working. This procedure takes approximately one hour. There are no restrictions for this procedure.  You have been referred to Dr. Rayann Heman for ablation consideration.  Your physician wants you to follow-up in: 1 year with Dr. [name].  You will receive a reminder letter in the mail two months in advance. If you don't receive a letter, please call our office to schedule the follow-up appointment.

## 2014-11-20 NOTE — Progress Notes (Signed)
Patient Care Team: [name], MD as PCP - General (Family Medicine) Sable Feil, MD (Gastroenterology)   HPI  [name] is a 54 y.o. male Seen in followup for pacemaker implanted for sinus node dysfunction and paroxysmal atrial arrhythmias which persisted despite dofetilide. He underwent catheter ablation of atrial fibrillation 2012. He had been on dofetilide; this was stopped March 2014.  He has had very little symptomatic atrial fibrillation.   Most recently he had problems with syncope. These episodes of mostly occurred with prolonged outside standing.  He is also having spells of tachypalpitations. We undertook an event recorder which is described below. These episodes are associated with dizziness and shortness of breath. They occur a couple of times a week. They're persisting despite therapy with dofetilide.    He has a history of ischemic heart disease with prior non-STEMI possibly type II as there was  no significant obstructive lesions noted. Catheterization was done in 2006 in 2009  showing nonobstructive disease. A Myoview scan 2011 was nonischemic. He also is a history of pulmonary embolism   Echocardiogram 2012 demonstrated mild LV dysfunction 45-50%     Past Medical History  Diagnosis Date  . GERD (gastroesophageal reflux disease)   . HTN (hypertension)   . Obesity   . CAD -nonobstructive     S/P nstemi-type II cath 05/2008 - nonobs. dzs.  . Pulmonary embolism 04/2003    Hx of  . Atrial fibrillation     s/p afib ablation  PVI 5/12 JA  . Sinus node dysfunction     s/p MDT PPM  . Hyperlipemia   . Systolic and diastolic CHF, chronic     EF 35% by most recent echo 09/19/10  . PUD (peptic ulcer disease)     gastritis due to ETOH previously  . Hyperthyroidism     Graves dz on methimazole and followed by [name]  . Polysubstance abuse     cocaine, last used 1999  . Pancreatitis   . Rectal bleeding   . Hemorrhoids   . Pacemaker-medtronic- AAI     Dual chamber V port plugged   . DDD (degenerative disc disease)     evaluate by neurosurgery is ongoing  . Complication of anesthesia   . PONV (postoperative nausea and vomiting)     2010 after went home -infection  . Anginal pain     freq  goes away quickly  . Depression     ptsd  . Erectile dysfunction   . Shortness of breath     LYING DOWN AT TIMES  . Sleep apnea     uses CPAP [facility] 2 YRS  . Headache(784.0)   . 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 Pacemaker insertion  10/09    by Dr [name]  . Fingers removed from right hand.  2/84    traumatic work injury  . Lumbar spine surgery  02/2009    Dr. Trenton Gammon  . Atrial ablation surgery  12/2010    afib ablation by [name]  . Insert / replace / remove pacemaker      05/2008    . Cardiac catheterization      CADIAC ABLATION [name] 12/2010  . Anterior cervical decomp/discectomy fusion  12/15/2011    Procedure: ANTERIOR CERVICAL DECOMPRESSION/DISCECTOMY FUSION 1 LEVEL;  Surgeon: [name], MD;  Location: [facility];  Service: Neurosurgery;  Laterality: N/A;  Cervical Six-Seven Anterior Cervical Diskectomy fusion with Allograft and Plating  . Back surgery      Current Outpatient Prescriptions  Medication Sig Dispense Refill  . Azelastine HCl (ASTEPRO) 0.15 % SOLN Place 2 sprays into both nostrils daily as needed (allergies).     . carvedilol (COREG) 25 MG tablet Take 1 tablet (25 mg total) by mouth 2 (two) times daily with a meal. 60 tablet 2  . clobetasol ointment (TEMOVATE) 7.35 % Apply 1 application topically daily as needed (rash).     . cyclobenzaprine (FLEXERIL) 10 MG tablet Take 10 mg by mouth 2 (two) times daily. For muscle spams    . dabigatran (PRADAXA) 150 MG CAPS capsule TAKE 1 CAPSULE (150 MG TOTAL) BY MOUTH EVERY 12 (TWELVE) HOURS. 60 capsule 6  . dofetilide (TIKOSYN) 500 MCG capsule Take 1 capsule (500 mcg total) by mouth 2 (two)  times daily. 60 capsule 6  . fluticasone (FLONASE) 50 MCG/ACT nasal spray Place 2 sprays into both nostrils daily as needed for allergies.     [name] gabapentin (NEURONTIN) 100 MG capsule Take 100 mg by mouth every morning.     [name] KLOR-CON M20 20 MEQ tablet TAKE 1 TABLET BY MOUTH EVERY DAY 30 tablet 1  . losartan (COZAAR) 50 MG tablet TAKE 1 TABLET BY MOUTH DAILY 30 tablet 11  . methimazole (TAPAZOLE) 10 MG tablet Take 1 tablet (10 mg total) by mouth daily. 30 tablet 1  . nitroGLYCERIN (NITROSTAT) 0.4 MG SL tablet Place 0.4 mg under the tongue every 5 (five) minutes as needed. For chest pain    . omeprazole (PRILOSEC) 40 MG capsule Take 40 mg by mouth daily.     [name] oxyCODONE-acetaminophen (PERCOCET) 10-325 MG per tablet Take 1 tablet by mouth every 4 (four) hours as needed for pain. 29 tablet 0  . sildenafil (VIAGRA) 100 MG tablet Take 100 mg by mouth daily as needed. For erectile dysfunction    . simvastatin (ZOCOR) 40 MG tablet TAKE 1 TABLET (40 MG TOTAL) BY MOUTH DAILY. 30 tablet 1  . testosterone (ANDROGEL) 50 MG/5GM GEL Place 5 g onto the skin daily.    . [DISCONTINUED] vardenafil (LEVITRA) 20 MG tablet Take 1 tablet (20 mg total) by mouth daily as needed for erectile dysfunction. 20 tablet 3   No current facility-administered medications for this visit.    Allergies  Allergen Reactions  . Hydrocodone-Acetaminophen Dermatitis    Can take plain Tylenol  . Celebrex [Celecoxib] Itching  . Hydrocodone Itching  . Prednisolone Itching    Review of Systems negative except from HPI and PMH  Physical Exam BP 120/80 mmHg  Pulse 64  Ht 5\' 11"  (1.803 m)  Wt 277 lb 6.4 oz (125.828 kg)  BMI 38.71 kg/m2 Well developed and well nourished in no acute distress HENT normal E scleral and icterus clear Neck Supple JVP flat; carotids brisk and full Clear to ausculation  Regular rate and rhythm, no murmurs gallops or rub Soft with active bowel sounds No clubbing cyanosis none Edema Alert and  oriented, grossly normal motor and sensory function Skin Warm and Dry  ECG shows sinus rhythm at 64 Interval 16/11/42 occ  PACs  Event recorder was reviewed today with the patient. Demonstrates recurrent bursts of a tachycardia associated with aberration at its initiation. There is an underlying atrial cycle length seems to be identical to the ventricular rates suggesting an atrial flutter with one-to-one conduction.  Assessment and  Plan  Syncope  Atrial fibrillation /flutter with one-to-one conduction    Sinus node dysfunction  Pacemaker-Medtronic -dual-chamber with ventricular port plugged--AAI The patient's device was interrogated and the information was fully reviewed.  The device was reprogrammed to  allow recording of his atrial high rate episodes to confirm atrial fibrillation  Chronic pain  he has a history of prior atrial fibrillation ablation by Dr. Greggory Brandy. With his recent current tachypalpitations and associated with a very rapid rate despite dofetilide, I will ask him to see Dr. Greggory Brandy again for consideration of repeat ablation. In the interim, we will add metoprolol 25 twice daily to his carvedilol to hopefully augment rate control. I am concerned about potential deleterious impact of a heart rate of 225 in the context of his atrial fibrillation.

## 2014-11-27 ENCOUNTER — Other Ambulatory Visit: Payer: Self-pay | Admitting: Internal Medicine

## 2014-11-27 NOTE — Telephone Encounter (Signed)
Approved      Disp Refills Start End    simvastatin (ZOCOR) 40 MG tablet 30 tablet 1 08/29/2014     Sig:  TAKE 1 TABLET (40 MG TOTAL) BY MOUTH DAILY.    Class:  Normal    DAW:  No    Authorizing Provider:  Deboraha Sprang, MD    Ordering User:  Guinevere Ferrari

## 2014-11-28 NOTE — Telephone Encounter (Signed)
Ok to refill for one year  

## 2014-11-30 ENCOUNTER — Encounter: Payer: Self-pay | Admitting: Internal Medicine

## 2014-12-13 ENCOUNTER — Encounter: Payer: Self-pay | Admitting: Internal Medicine

## 2014-12-13 ENCOUNTER — Ambulatory Visit (INDEPENDENT_AMBULATORY_CARE_PROVIDER_SITE_OTHER): Payer: Medicaid Other | Admitting: Internal Medicine

## 2014-12-13 ENCOUNTER — Ambulatory Visit (HOSPITAL_COMMUNITY): Payer: Medicaid Other | Attending: Cardiology

## 2014-12-13 VITALS — BP 110/80 | HR 50 | Ht 71.0 in | Wt 279.4 lb

## 2014-12-13 DIAGNOSIS — I5042 Chronic combined systolic (congestive) and diastolic (congestive) heart failure: Secondary | ICD-10-CM

## 2014-12-13 DIAGNOSIS — I480 Paroxysmal atrial fibrillation: Secondary | ICD-10-CM | POA: Diagnosis not present

## 2014-12-13 DIAGNOSIS — G4733 Obstructive sleep apnea (adult) (pediatric): Secondary | ICD-10-CM | POA: Diagnosis not present

## 2014-12-13 DIAGNOSIS — I495 Sick sinus syndrome: Secondary | ICD-10-CM | POA: Diagnosis not present

## 2014-12-13 DIAGNOSIS — I4891 Unspecified atrial fibrillation: Secondary | ICD-10-CM | POA: Diagnosis not present

## 2014-12-13 LAB — MDC_IDC_ENUM_SESS_TYPE_INCLINIC
Battery Remaining Longevity: 142 mo
Battery Voltage: 2.79 V
Date Time Interrogation Session: 20160414092622
Lead Channel Impedance Value: 67 Ohm
Lead Channel Pacing Threshold Amplitude: 0.5 V
Lead Channel Pacing Threshold Pulse Width: 0.4 ms
MDC IDC MSMT BATTERY IMPEDANCE: 269 Ohm
MDC IDC MSMT LEADCHNL RA IMPEDANCE VALUE: 443 Ohm
MDC IDC SET LEADCHNL RA PACING AMPLITUDE: 2 V
MDC IDC STAT BRADY RA PERCENT PACED: 20 %

## 2014-12-13 MED ORDER — METOPROLOL TARTRATE 25 MG PO TABS: 37.5000 mg | ORAL_TABLET | Freq: Two times a day (BID) | ORAL | Status: DC

## 2014-12-13 NOTE — Patient Instructions (Signed)
Your physician recommends that you schedule a follow-up appointment in: 3 months with [name]   Your physician has recommended you make the following change in your medication:  1) Increase Metoprolol to 37.5 mg twice daily

## 2014-12-13 NOTE — Progress Notes (Signed)
2D Echo completed. 12/13/2014 

## 2014-12-13 NOTE — Progress Notes (Signed)
Electrophysiology Office Note   Date:  12/13/2014   ID:  [name], DOB 1960/12/21, MRN [id number]  PCP:  [name], MD   Primary Electrophysiologist: Dr [name]   Chief Complaint  Patient presents with  . Atrial Fibrillation    Paroxysmal     History of Present Illness: [name] is a 54 y.o. male who presents today for electrophysiology evaluation.   The patient is s/p afib ablation by me in 2012.  He has done well but continues to have atrial arrhythmias.  He has atrial flutter/ atach with very fast ventricular rates and symptoms of presyncope.  He recently saw Dr [name] and metoprolol was added.  He is also on [place].  Today, he denies symptoms of chest pain, shortness of breath, orthopnea, PND, lower extremity edema, claudication, bleeding, or neurologic sequela. The patient is tolerating medications without difficulties and is otherwise without complaint today.    Past Medical History  Diagnosis Date  . GERD (gastroesophageal reflux disease)   . HTN (hypertension)   . Obesity   . CAD -nonobstructive     S/P nstemi-type II cath 05/2008 - nonobs. dzs.  . Pulmonary embolism 04/2003    Hx of  . Atrial fibrillation     s/p afib ablation  PVI 5/12 JA  . Sinus node dysfunction     s/p MDT PPM  . Hyperlipemia   . Systolic and diastolic CHF, chronic     EF 35% by most recent echo 09/19/10  . PUD (peptic ulcer disease)     gastritis due to ETOH previously  . Hyperthyroidism     Graves dz on methimazole and followed by [name]  . Polysubstance abuse     cocaine, last used 1999  . Pancreatitis   . Rectal bleeding   . Hemorrhoids   . Pacemaker-medtronic- AAI     Dual chamber V port plugged   . DDD (degenerative disc disease)     evaluate by neurosurgery is ongoing  . Complication of anesthesia   . PONV (postoperative nausea and vomiting)     2010 after went home -infection  . Anginal pain     freq  goes away quickly  . Depression     ptsd    . Erectile dysfunction   . Shortness of breath     LYING DOWN AT TIMES  . Sleep apnea     uses CPAP [facility] 2 YRS  . Headache(784.0)   . 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 Pacemaker insertion  10/09    by Dr [name]  . Fingers removed from right hand.  2/84    traumatic work injury  . Lumbar spine surgery  02/2009    Dr. Trenton Gammon  . Atrial ablation surgery  12/2010    afib ablation by [name]  . Insert / replace / remove pacemaker      05/2008    . Cardiac catheterization      CADIAC ABLATION [name] 12/2010  . Anterior cervical decomp/discectomy fusion  12/15/2011    Procedure: ANTERIOR CERVICAL DECOMPRESSION/DISCECTOMY FUSION 1 LEVEL;  Surgeon: [name], MD;  Location: [facility];  Service: Neurosurgery;  Laterality: N/A;  Cervical Six-Seven Anterior Cervical Diskectomy fusion with Allograft and Plating  . Back surgery       Current Outpatient Prescriptions  Medication Sig Dispense Refill  . Azelastine HCl (  ASTEPRO) 0.15 % SOLN Place 2 sprays into both nostrils daily as needed (allergies).     . carvedilol (COREG) 25 MG tablet Take 1 tablet (25 mg total) by mouth 2 (two) times daily with a meal. 60 tablet 2  . cyclobenzaprine (FLEXERIL) 10 MG tablet Take 10 mg by mouth 2 (two) times daily. For muscle spams    . dabigatran (PRADAXA) 150 MG CAPS capsule TAKE 1 CAPSULE (150 MG TOTAL) BY MOUTH EVERY 12 (TWELVE) HOURS. 60 capsule 6  . dofetilide (TIKOSYN) 500 MCG capsule Take 1 capsule (500 mcg total) by mouth 2 (two) times daily. 60 capsule 6  . fluticasone (FLONASE) 50 MCG/ACT nasal spray Place 2 sprays into both nostrils daily as needed for allergies.     [name] gabapentin (NEURONTIN) 100 MG capsule Take 100 mg by mouth every morning.     [name] KLOR-CON M20 20 MEQ tablet TAKE 1 TABLET BY MOUTH EVERY DAY 30 tablet 1  . methimazole (TAPAZOLE) 10 MG tablet Take 1 tablet (10 mg total) by mouth daily. 30  tablet 1  . metoprolol tartrate (LOPRESSOR) 25 MG tablet Take 1 tablet (25 mg total) by mouth 2 (two) times daily. 60 tablet 6  . nitroGLYCERIN (NITROSTAT) 0.4 MG SL tablet Place 0.4 mg under the tongue every 5 (five) minutes as needed (MAX 3 TABLETS). For chest pain    . omeprazole (PRILOSEC) 40 MG capsule Take 40 mg by mouth daily.     [name] oxyCODONE-acetaminophen (PERCOCET) 10-325 MG per tablet Take 1 tablet by mouth every 4 (four) hours as needed for pain. 29 tablet 0  . sildenafil (VIAGRA) 100 MG tablet Take 100 mg by mouth daily as needed. For erectile dysfunction    . simvastatin (ZOCOR) 40 MG tablet TAKE 1 TABLET (40 MG TOTAL) BY MOUTH DAILY. 30 tablet 11  . [DISCONTINUED] vardenafil (LEVITRA) 20 MG tablet Take 1 tablet (20 mg total) by mouth daily as needed for erectile dysfunction. 20 tablet 3   No current facility-administered medications for this visit.    Allergies:   Hydrocodone-acetaminophen; Celebrex; Hydrocodone; and Prednisolone   Social History:  The patient  reports that he quit smoking about 11 years ago. His smoking use included Cigarettes. He has a 15 pack-year smoking history. He has never used smokeless tobacco. He reports that he does not drink alcohol or use illicit drugs.   Family History:  The patient's family history includes Breast cancer in his mother; Diabetes in an other family member; Heart disease in his mother; Lung cancer in his father. There is no history of Colon cancer.    ROS:  Please see the history of present illness.   All other systems are reviewed and negative.    PHYSICAL EXAM: VS:  BP 110/80 mmHg  Pulse 50  Ht 5\' 11"  (1.803 m)  Wt 279 lb 6.4 oz (126.735 kg)  BMI 38.99 kg/m2 , BMI Body mass index is 38.99 kg/(m^2). GEN: Well nourished, well developed, in no acute distress HEENT: normal Neck: no JVD, carotid bruits, or masses Cardiac: RRR; no murmurs, rubs, or gallops,no edema  Respiratory:  clear to auscultation bilaterally, normal work of  breathing GI: soft, nontender, nondistended, + BS MS: no deformity or atrophy Skin: warm and dry, device pocket is well healed Neuro:  Strength and sensation are intact Psych: euthymic mood, full affect  EKG:  EKG is ordered today. The ekg ordered today shows atrial pacing, nonspecific St/T changes, Qtc 406  Device interrogation is reviewed today in  detail.  See PaceArt for details.   Recent Labs: 09/13/2014: TSH 1.25 09/15/2014: Hemoglobin 14.8; Platelets 216 09/26/2014: BUN 19; Creatinine 0.95; Magnesium 2.0; Potassium 4.1; Sodium 136    Lipid Panel     Component Value Date/Time   CHOL  08/13/2010 0203    146        ATP III CLASSIFICATION:  <200     mg/dL   Desirable  200-239  mg/dL   Borderline High  >=240    mg/dL   High          TRIG 70 08/13/2010 0203   HDL 43 08/13/2010 0203   CHOLHDL 3.4 08/13/2010 0203   VLDL 14 08/13/2010 0203   LDLCALC  08/13/2010 0203    89        Total Cholesterol/HDL:CHD Risk Coronary Heart Disease Risk Table                     Men   Women  1/2 Average Risk   3.4   3.3  Average Risk       5.0   4.4  2 X Average Risk   9.6   7.1  3 X Average Risk  23.4   11.0        Use the calculated Patient Ratio above and the CHD Risk Table to determine the patient's CHD Risk.        ATP III CLASSIFICATION (LDL):  <100     mg/dL   Optimal  100-129  mg/dL   Near or Above                    Optimal  130-159  mg/dL   Borderline  160-189  mg/dL   High  >190     mg/dL   Very High   LDLDIRECT 163.8 02/08/2008 1003     Wt Readings from Last 3 Encounters:  12/13/14 279 lb 6.4 oz (126.735 kg)  11/20/14 277 lb 6.4 oz (125.828 kg)  09/16/14 266 lb 11.2 oz (120.974 kg)      Other studies Reviewed: Additional studies/ records that were reviewed today include: Dr Aquilla Hacker notes  Review of the above records today demonstrates: recent event monitor   ASSESSMENT AND PLAN:  1.  Atrial arrhythmias S/p prior afib ablation He also has atrial flutter/  atrial tachycardia with fast ventricular rates. He has failed medical therapy with tikosyn and coreg.  He is doing a little better with metoprolol Therapeutic strategies for atrial arrhythmias including medicine and ablation were discussed in detail with the patient today. Risk, benefits, and alternatives to EP study and radiofrequency ablation for afib were also discussed in detail today. These risks include but are not limited to stroke, bleeding, vascular damage, tamponade, perforation, damage to the esophagus, lungs, and other structures, pulmonary vein stenosis, worsening renal function, and death. The patient understands these risk and wishes to avoid ablation at this time.  He would like to continue to titrate metoprolol and follow conservatively.  If his atrial arrhythmias continue then he may be more willing to consider ablation  2. Sick sinus Normal pacemaker function See Pace Art report No changes today  3. OSA Compliance with CPAP is encouarged  4. Nonischemic CM euvolemic today Stable No change required today   Current medicines are reviewed at length with the patient today.   The patient does not have concerns regarding his medicines.  The following changes were made today:  none  Follow-up: return to see  me in 3 months, if he is not interested in ablation at that time, I will return his care to Dr [name]  Signed, Thompson Grayer, MD  12/13/2014 9:21 AM     [address] [place] [address] [phone] (office) [phone] (fax)

## 2014-12-19 ENCOUNTER — Other Ambulatory Visit: Payer: Self-pay | Admitting: Internal Medicine

## 2014-12-20 ENCOUNTER — Encounter: Payer: Self-pay | Admitting: Internal Medicine

## 2014-12-25 ENCOUNTER — Telehealth: Payer: Self-pay | Admitting: *Deleted

## 2014-12-25 NOTE — Telephone Encounter (Signed)
Connye Burkitt Reiland, LPN  Deboraha Sprang, MD; Stanton Kidney, RN           Patient states that since starting Metoprolol 25mg  bid, along with his Carvedilol, he feels totally drained, washed out. Wants to know what he can do.

## 2014-12-27 ENCOUNTER — Encounter: Payer: Self-pay | Admitting: Internal Medicine

## 2014-12-27 NOTE — Telephone Encounter (Signed)
This encounter was created in error - please disregard.

## 2014-12-27 NOTE — Telephone Encounter (Signed)
Call Documentation      Pauline Good at 12/27/2014 12:08 PM     Status: Signed       Expand All Collapse All   New problem    Pt returning call from nurse concerning his medications.

## 2014-12-27 NOTE — Telephone Encounter (Signed)
New problem    Pt returning call from nurse concerning his medications.

## 2014-12-27 NOTE — Telephone Encounter (Signed)
Informed patient that I was waiting for Dr. Olin Pia recommendations.  Patient stated that he stopped taking Metoprolol because he couldn't take feeling so bad. Aware I will contact him once reviewed by Dr. [name].

## 2014-12-30 ENCOUNTER — Other Ambulatory Visit: Payer: Self-pay | Admitting: Internal Medicine

## 2014-12-31 ENCOUNTER — Telehealth: Payer: Self-pay

## 2014-12-31 NOTE — Telephone Encounter (Signed)
Advised patient Dr. [name] still hasn't reviewed and that I would call him once he has been able to review this. See 4/26 documentation for further information in regards to this. Patient verbalized understanding and agreeable to plan.

## 2014-12-31 NOTE — Telephone Encounter (Signed)
[name] wanted to be sure you knew that Dr. Rayann Heman increased Metoprolol to 25mg  1 1/2 tabs bid. He has since stopped it due to fatigue. i asked him to await further instructions.

## 2015-01-03 NOTE — Telephone Encounter (Signed)
If he cnat tolerate metoprolol tartrtae lets try diltiazem 120 daily

## 2015-01-19 ENCOUNTER — Other Ambulatory Visit: Payer: Self-pay | Admitting: Endocrinology

## 2015-01-25 NOTE — Telephone Encounter (Signed)
lmtcb to inform patient of Dr. Olin Pia recommendations.  (I did not see these orders because it was not sent to me to address. And through follow up, seeing this)

## 2015-01-31 NOTE — Telephone Encounter (Signed)
Patient states that he if feeling fine and would prefer to wait.  He does not want to start another medication at this time.  He will call if issues arise and we can re-address. He also mentioned that he has not gotten a phone call from someone about setting up home monitoring for his device. Advised patient I would have device clinic call him. He is agreeable.

## 2015-01-31 NOTE — Telephone Encounter (Signed)
Pt is coming to office on Friday 6-3 around 11 AM to receive help setting up [employer] smart.

## 2015-02-17 ENCOUNTER — Other Ambulatory Visit: Payer: Self-pay | Admitting: Endocrinology

## 2015-03-26 ENCOUNTER — Encounter: Payer: Self-pay | Admitting: *Deleted

## 2015-04-01 ENCOUNTER — Ambulatory Visit (INDEPENDENT_AMBULATORY_CARE_PROVIDER_SITE_OTHER): Payer: Medicaid Other | Admitting: *Deleted

## 2015-04-01 DIAGNOSIS — I495 Sick sinus syndrome: Secondary | ICD-10-CM

## 2015-04-03 NOTE — Progress Notes (Signed)
Remote pacemaker transmission.   

## 2015-04-09 LAB — CUP PACEART REMOTE DEVICE CHECK
Battery Voltage: 2.79 V
Date Time Interrogation Session: 20160801234646
Lead Channel Impedance Value: 452 Ohm
Lead Channel Impedance Value: 67 Ohm
Lead Channel Sensing Intrinsic Amplitude: 1.4 mV
MDC IDC MSMT BATTERY IMPEDANCE: 292 Ohm
MDC IDC MSMT BATTERY REMAINING LONGEVITY: 137 mo
MDC IDC SET LEADCHNL RA PACING AMPLITUDE: 2 V
MDC IDC STAT BRADY RA PERCENT PACED: 21 %

## 2015-04-15 ENCOUNTER — Other Ambulatory Visit: Payer: Self-pay | Admitting: Internal Medicine

## 2015-04-17 ENCOUNTER — Encounter: Payer: Self-pay | Admitting: Gastroenterology

## 2015-05-02 ENCOUNTER — Encounter: Payer: Self-pay | Admitting: Cardiology

## 2015-05-03 ENCOUNTER — Other Ambulatory Visit: Payer: Self-pay | Admitting: Internal Medicine

## 2015-05-07 ENCOUNTER — Telehealth: Payer: Self-pay | Admitting: Pulmonary Disease

## 2015-05-07 DIAGNOSIS — G4733 Obstructive sleep apnea (adult) (pediatric): Secondary | ICD-10-CM

## 2015-05-07 NOTE — Telephone Encounter (Signed)
Ok to order DME [facility] replacement CPAP machine, mask of choice, supplies, current pressure, add AirView,      Dx OSA

## 2015-05-07 NOTE — Telephone Encounter (Signed)
Order sent to [facility]. Spoke with the pt and notified that this was done. Nothing further needed.  

## 2015-05-07 NOTE — Telephone Encounter (Signed)
Pt states he needs order sent to [facility] for new CPAP and supplies as his has broken. [facility] informed me that patient has not kept up with getting supplies as often as he should and therefore they would need a new Rx sent to them for patient to get supplies as well. Pt is aware that [facility] is no longer with our practice and he would need to see another Sleep MD. Adonis Housekeeper are you okay giving verbal auth to send order/rx to [facility] for patient to get new machine and usual supplies as well as patient coming to see you on Thursday 05-09-15 at 3:30pm (15 minutes) slot. Pt has been without his machine for "a while" now. Thanks.

## 2015-05-09 ENCOUNTER — Ambulatory Visit: Payer: Medicaid Other | Admitting: Internal Medicine

## 2015-05-10 ENCOUNTER — Telehealth: Payer: Self-pay | Admitting: Internal Medicine

## 2015-05-10 NOTE — Telephone Encounter (Signed)
Spoke with Leafy Ro and Christy(RT) for [name]; pt has 2:00pm CPAP set up appt scheduled with them today. Leafy Ro states the patient will need to bring his current ("broken") CPAP machine with him to that appt so the RT can look at machine to see if it is truly broken. Also, Leafy Ro states the guidelines for replacement CPAP machine is OV within 6 months of date Rx was sent to them. She then stated the patient could take a "loaner" CPAP machine home with him today to use until he completes his OV with [facility] on Monday 05-13-15; at that time [facility] can send Rx for replacement CPAP machine with supplies,etc (see order placed on 05-07-15) to pt's insurance for approval. Leafy Ro is aware that we want and need the patient to be taken care of without any unnecessary delays. She is aware that patient has been without his CPAP for several days now due to not working.   After speaking with Leafy Ro at [facility] patient called me to find out what was going on with the situation. I informed him of the change in information he ad I were given on Thursday 05-09-15 by Daneil Dan at [facility]. Pt states he will go to 2:00pm appt at [facility] and understands he should walk out of appt with a CPAP machine with supplies,etc. Pt also aware to keep his Monday 05-13-15 appt with CY so OV notes can be given to [place] for [employer] purposes.   Pt will contact me if any further questions or concerns. Thanks.

## 2015-05-10 NOTE — Telephone Encounter (Signed)
Called spoke with [employer] from [place]. I made her aware that pt saw [facility] 06/25/14.  Pt has a pending appt with [facility] 05/13/15. Per Leafy Ro they can set pt up and pt sign "avm" but insurance may deny this without OV.  Spoke with Joellen Jersey and she reports she will call Mandy. Will send message to her

## 2015-05-13 ENCOUNTER — Encounter: Payer: Self-pay | Admitting: Internal Medicine

## 2015-05-13 ENCOUNTER — Ambulatory Visit (INDEPENDENT_AMBULATORY_CARE_PROVIDER_SITE_OTHER): Payer: Medicaid Other | Admitting: Internal Medicine

## 2015-05-13 VITALS — BP 124/78 | HR 50 | Ht 71.0 in | Wt 273.8 lb

## 2015-05-13 DIAGNOSIS — G4733 Obstructive sleep apnea (adult) (pediatric): Secondary | ICD-10-CM

## 2015-05-13 DIAGNOSIS — J309 Allergic rhinitis, unspecified: Secondary | ICD-10-CM | POA: Diagnosis not present

## 2015-05-13 DIAGNOSIS — J302 Other seasonal allergic rhinitis: Secondary | ICD-10-CM

## 2015-05-13 DIAGNOSIS — J3089 Other allergic rhinitis: Secondary | ICD-10-CM

## 2015-05-13 MED ORDER — AZELASTINE HCL 0.15 % NA SOLN: 2.0000 | Freq: Every day | NASAL | Status: DC | PRN

## 2015-05-13 MED ORDER — FLUTICASONE PROPIONATE 50 MCG/ACT NA SUSP: 2.0000 | Freq: Every day | NASAL | Status: DC | PRN

## 2015-05-13 NOTE — Patient Instructions (Addendum)
Flu vax  Scripts sent refilling azelastine and fluticasone nasal sprays  Order- [facility]- replacement for old worn out CPAP machine, auto 10-20, mask of choice, humidifier, supplies, enroll AirView    Dx OSA  Please call as needed

## 2015-05-13 NOTE — Progress Notes (Signed)
HPI  04/19/14- Dr Gwenette Greet Patient comes in today for follow-up of his obstructive sleep apnea. He is wearing C Pap compliantly, and is having no issues with his mask fit or pressure. He is having some issues with dryness as well as soreness in his left nostril. He is using his humidifier, but is unsure if he has a heated hose. He feels that he sleeps well with the device, and has lost 25 pounds since the last visit.  05/13/15- 53 yoM former smoker followed for OSA, complicated by CAD/CHF/PAFib/ pacemaker/ Pradaxa, allergic rhinitis, hyperthyroid NPSG 2010- AHI 21/ hr CPAP/ [facility] 14-  Full face mask He is been having some difficulty working with [facility] to get replacement for old machine. They may have been waiting on this visit for documentation. He has been very compliant, using CPAP all night every night and has been holding his old machine together literally with duct tape. He notices nasal stuffiness, dry mouth. Thinks poor mask seal contributes to the dry mouth. We also discussed humidifier function. He disliked Biotene.  ROS-see HPI   Negative unless "+" Constitutional:    weight loss, night sweats, fevers, chills, fatigue, lassitude. HEENT:    headaches, difficulty swallowing, tooth/dental problems, sore throat,       sneezing, itching, ear ache, +nasal congestion, post nasal drip, snoring CV:    chest pain, orthopnea, PND, swelling in lower extremities, anasarca,                                                    dizziness, palpitations Resp:   shortness of breath with exertion or at rest.                productive cough,   non-productive cough, coughing up of blood.              change in color of mucus.  wheezing.   Skin:    rash or lesions. GI:  No-   heartburn, indigestion, abdominal pain, nausea, vomiting GU: . MS:   joint pain, stiffness,  Neuro-     nothing unusual Psych:  change in mood or affect.  depression or anxiety.   memory loss.  OBJ- Physical Exam General- Alert,  Oriented, Affect-appropriate, Distress- none acute Skin- rash-none, lesions- none, excoriation- none Lymphadenopathy- none Head- atraumatic            Eyes- Gross vision intact, PERRLA, conjunctivae and secretions clear            Ears- Hearing, canals-normal            Nose- Clear, no-Septal dev, mucus, polyps, erosion, perforation             Throat- Mallampati IV , mucosa clear , drainage- none, tonsils- atrophic Neck- flexible , trachea midline, no stridor , thyroid nl, carotid no bruit Chest - symmetrical excursion , unlabored           Heart/CV- RRR , no murmur , no gallop  , no rub, nl s1 s2                           - JVD- none , edema- none, stasis changes- none, varices- none           Lung- clear to P&A, wheeze- none, cough- none , dullness-none, rub-  none           Chest wall-  Abd-  Br/ Gen/ Rectal- Not done, not indicated Extrem- cyanosis- none, clubbing, none, atrophy- none, strength- nl Neuro- grossly intact to observation

## 2015-05-15 ENCOUNTER — Encounter: Payer: Self-pay | Admitting: Internal Medicine

## 2015-05-15 NOTE — Assessment & Plan Note (Signed)
We discussed antihistamines, decongestants and use of nasal saline. Plan-flu vaccine

## 2015-05-15 NOTE — Assessment & Plan Note (Signed)
His old machine is worn out, mask no longer fits and he is having trouble with dry mouth. With his cardiac history, he definitely needs to continue CPAP and he has been using it compliantly. Plan-[facility] to replace CPAP machine, mask, supplies, changed to AutoPap 10-20 and install AirView if available so we can get documentation.

## 2015-05-16 ENCOUNTER — Telehealth: Payer: Self-pay | Admitting: Internal Medicine

## 2015-05-16 DIAGNOSIS — G4733 Obstructive sleep apnea (adult) (pediatric): Secondary | ICD-10-CM

## 2015-05-16 NOTE — Telephone Encounter (Signed)
Called spoke with [facility]. They were waiting for Dr. Annamaria Boots to finish pt OV note. They got it this AM. They will put request in for pt machine and call pt. Called spoke with pt and informed him of the above. Nothing further needed

## 2015-05-20 NOTE — Telephone Encounter (Signed)
New order placed for cpap with pressure settings.  Nothing further needed.

## 2015-05-21 ENCOUNTER — Telehealth: Payer: Self-pay | Admitting: Internal Medicine

## 2015-05-21 NOTE — Telephone Encounter (Signed)
Pt called to let me know that he has not received his new CPAP machine from [facility] as of today-he states he knew there were several issues going on with OV being current, getting machine sent in,etc. Pt states he continues to wear his CPAP machine but it is not working properly-he feels more tired during the day and falling asleep while stopped at light in traffic-pt was told to remember to be cautious driving/operating equipment if sleepy-have someone else drive for him,etc.   [facility] stated that they have sent order to have new CPAP sent to them-hopefully in the next 3-5 days. [facility] had to get clarification on order that was sent to them on pt-pressure settings. Estill Bamberg with [facility] is the person I spoke with-she was made aware that pt is not sleeping well and have excessive daytime sleepiness; really needs to get his new CPAP ASAP!!. She then stated that she would speak with her RT in am and get patient a machine to use while his is being shipped to them. Pt is aware and we will both wait for a call back from [facility] tomorrow.

## 2015-05-22 NOTE — Telephone Encounter (Signed)
Staff message form [facility] staff on 05-22-15:  [name]  Lorane Gell, CMA           Good Morning,   I have spoken with the RT and she will be setting the patient up today she is just looking at her schedule and she will be calling him to set up appointment time.   Thanks   Amanda     Pt was made aware that [facility] will contact him today. If he has any other questions/concerns he will contact our office. Thanks.

## 2015-07-03 ENCOUNTER — Telehealth: Payer: Self-pay | Admitting: Cardiology

## 2015-07-03 ENCOUNTER — Ambulatory Visit (INDEPENDENT_AMBULATORY_CARE_PROVIDER_SITE_OTHER): Payer: Medicaid Other | Admitting: *Deleted

## 2015-07-03 DIAGNOSIS — I495 Sick sinus syndrome: Secondary | ICD-10-CM

## 2015-07-03 NOTE — Telephone Encounter (Signed)
Spoke with pt and reminded pt of remote transmission that is due today. Pt verbalized understanding.   

## 2015-07-04 ENCOUNTER — Encounter: Payer: Self-pay | Admitting: Cardiology

## 2015-07-05 NOTE — Progress Notes (Signed)
Remote pacemaker transmission.   

## 2015-07-11 DIAGNOSIS — I861 Scrotal varices: Secondary | ICD-10-CM | POA: Insufficient documentation

## 2015-07-18 LAB — CUP PACEART REMOTE DEVICE CHECK
Battery Remaining Longevity: 134 mo
Battery Voltage: 2.79 V
Brady Statistic RA Percent Paced: 22 %
Date Time Interrogation Session: 20161103225048
Implantable Lead Implant Date: 20091023
Implantable Lead Model: 5076
Lead Channel Impedance Value: 469 Ohm
MDC IDC LEAD LOCATION: 753859
MDC IDC MSMT BATTERY IMPEDANCE: 315 Ohm
MDC IDC MSMT LEADCHNL RV IMPEDANCE VALUE: 67 Ohm
MDC IDC MSMT LEADCHNL RV SENSING INTR AMPL: 1.4 mV
MDC IDC SET LEADCHNL RA PACING AMPLITUDE: 2 V

## 2015-07-19 ENCOUNTER — Encounter: Payer: Self-pay | Admitting: Cardiology

## 2015-08-16 ENCOUNTER — Telehealth: Payer: Self-pay | Admitting: Internal Medicine

## 2015-08-16 MED ORDER — DOFETILIDE 500 MCG PO CAPS: 500.0000 ug | ORAL_CAPSULE | Freq: Two times a day (BID) | ORAL | Status: DC

## 2015-08-16 NOTE — Telephone Encounter (Signed)
New message       *STAT* If patient is at the pharmacy, call can be transferred to refill team.   1. Which medications need to be refilled? (please list name of each medication and dose if known) tikosyn 2. Which pharmacy/location (including street and city if local pharmacy) is medication to be sent to? [facility] [address] 3. Do they need a 30 day or 90 day supply? Pt want 90 day supply with refills.  He is having to call every month for refills

## 2015-08-21 ENCOUNTER — Telehealth: Payer: Self-pay | Admitting: Internal Medicine

## 2015-08-21 NOTE — Telephone Encounter (Signed)
I called and spoke with [name] at the [facility] [address] He states they did receive the refill request for Tikosyn. Medicaid is only approving brand name Tikosyn at this time. We would have to do a prior authorization for generic Tikosyn. Per [name], he will have to order the brand name Tikosyn and this may not be in until 12/27 or 12/28. They do have generic in stock. I called and spoke with the patient and advised him of the situation.  He is aware we will proceed with the Prior Auth for his generic Tikosyn, but I cannot promise this will go through. I advised him I would call him back later today and let him know what the status of the prior auth is. He is taking his last dose this morning.  I spoke with [name], LPN in refills. She will work on the prior [place] for me and let me know what his going on with this.

## 2015-08-21 NOTE — Telephone Encounter (Signed)
New Message  Pt calling to speak w/ RN only- pt stated that pharmacy has not received tikosyn refill from our office. Pt is aware that there is a receipt confirmation from the pharmacy from 08/16/15. Pt requested to only speak w/ Rn concerning this issue. Please call back and discuss.

## 2015-08-21 NOTE — Telephone Encounter (Signed)
Per [name], LPN - she spoke with the pharmacy later in the day and they were able to get branded Tikosyn to the store from another location. They have filled this and the patient has picked it up at the pharmacy.

## 2015-09-13 ENCOUNTER — Other Ambulatory Visit: Payer: Self-pay | Admitting: Neurosurgery

## 2015-09-13 DIAGNOSIS — M48061 Spinal stenosis, lumbar region without neurogenic claudication: Secondary | ICD-10-CM

## 2015-09-13 DIAGNOSIS — M47812 Spondylosis without myelopathy or radiculopathy, cervical region: Secondary | ICD-10-CM

## 2015-09-17 ENCOUNTER — Telehealth: Payer: Self-pay | Admitting: Pharmacist

## 2015-09-17 NOTE — Telephone Encounter (Signed)
Received fax from [place] for clearance to hold Pradaxa for spinal myelogram. Clearance given per protocol, form faxed on 09/17/15.

## 2015-09-23 ENCOUNTER — Telehealth: Payer: Self-pay | Admitting: Internal Medicine

## 2015-09-23 ENCOUNTER — Telehealth: Payer: Self-pay

## 2015-09-23 NOTE — Telephone Encounter (Signed)
Pt missed last nights tikosyn dose, [facility] said [facility] needed [facility], ( read 08/21/15 note) I spoke with Linda/ prior auth and per Medicaid they need another prior auth for 2017. I told pt I will put a few tikosyn tablets at the front desk for him to pick up. Pt will come and get them while we work on new prior auth.

## 2015-09-23 NOTE — Telephone Encounter (Signed)
Prior auth for Brand name Tikosyn 531mcg initiated through [employer]. Will have a determination in 24 hours.

## 2015-09-23 NOTE — Telephone Encounter (Signed)
i will send to Alvis Lemmings Rn to follow.

## 2015-09-23 NOTE — Telephone Encounter (Signed)
New Message  Pt requested to speak w/ RN about missing a tikosyn dosage. Please call back and discuss.

## 2015-09-24 ENCOUNTER — Telehealth: Payer: Self-pay

## 2015-09-24 NOTE — Telephone Encounter (Signed)
Brand Tikosyn 568mcg approved by [place] [employer]/Medicaid. Auth NO:[id number]. Good through 09/17/2016. Pharmacy notified.

## 2015-10-03 ENCOUNTER — Telehealth: Payer: Self-pay | Admitting: Cardiology

## 2015-10-03 ENCOUNTER — Encounter: Payer: Medicaid Other | Admitting: *Deleted

## 2015-10-03 NOTE — Telephone Encounter (Signed)
Spoke with pt and reminded pt of remote transmission that is due today. Pt verbalized understanding.   

## 2015-10-07 ENCOUNTER — Encounter: Payer: Self-pay | Admitting: Cardiology

## 2015-10-07 ENCOUNTER — Inpatient Hospital Stay: Admission: RE | Admit: 2015-10-07 | Payer: Medicaid Other | Source: Ambulatory Visit

## 2015-10-07 ENCOUNTER — Other Ambulatory Visit: Payer: Medicaid Other

## 2015-10-07 ENCOUNTER — Inpatient Hospital Stay
Admission: RE | Admit: 2015-10-07 | Discharge: 2015-10-07 | Disposition: A | Discharge status: Home / Self Care | Payer: Medicaid Other | Source: Ambulatory Visit | Attending: Neurosurgery | Admitting: Neurosurgery

## 2015-10-07 NOTE — Discharge Instructions (Signed)

## 2015-10-14 ENCOUNTER — Ambulatory Visit (INDEPENDENT_AMBULATORY_CARE_PROVIDER_SITE_OTHER): Payer: Medicaid Other | Admitting: *Deleted

## 2015-10-14 ENCOUNTER — Inpatient Hospital Stay: Admission: RE | Admit: 2015-10-14 | Payer: Medicaid Other | Source: Ambulatory Visit

## 2015-10-14 ENCOUNTER — Other Ambulatory Visit: Payer: Medicaid Other

## 2015-10-14 DIAGNOSIS — I495 Sick sinus syndrome: Secondary | ICD-10-CM | POA: Diagnosis not present

## 2015-10-15 NOTE — Progress Notes (Signed)
Remote pacemaker transmission.   

## 2015-10-16 ENCOUNTER — Encounter: Payer: Self-pay | Admitting: Cardiology

## 2015-10-16 LAB — CUP PACEART REMOTE DEVICE CHECK
Battery Impedance: 339 Ohm
Battery Remaining Longevity: 131 mo
Brady Statistic RA Percent Paced: 23 %
Date Time Interrogation Session: 20170211153805
Lead Channel Impedance Value: 67 Ohm
Lead Channel Setting Pacing Amplitude: 2 V
MDC IDC LEAD IMPLANT DT: 20091023
MDC IDC LEAD LOCATION: 753859
MDC IDC MSMT BATTERY VOLTAGE: 2.79 V
MDC IDC MSMT LEADCHNL RA IMPEDANCE VALUE: 459 Ohm
MDC IDC MSMT LEADCHNL RA SENSING INTR AMPL: 2.8 mV

## 2015-10-22 ENCOUNTER — Inpatient Hospital Stay
Admission: RE | Admit: 2015-10-22 | Discharge: 2015-10-22 | Disposition: A | Discharge status: Home / Self Care | Payer: Medicaid Other | Source: Ambulatory Visit | Attending: Neurosurgery | Admitting: Neurosurgery

## 2015-10-22 ENCOUNTER — Other Ambulatory Visit: Payer: Medicaid Other

## 2015-10-22 NOTE — Discharge Instructions (Signed)
Myelogram Discharge Instructions  1. Go home and rest quietly for the next 24 hours.  It is important to lie flat for the next 24 hours.  Get up only to go to the restroom.  You may lie in the bed or on a couch on your back, your stomach, your left side or your right side.  You may have one pillow under your head.  You may have pillows between your knees while you are on your side or under your knees while you are on your back.  2. DO NOT drive today.  Recline the seat as far back as it will go, while still wearing your seat belt, on the way home.  3. You may get up to go to the bathroom as needed.  You may sit up for 10 minutes to eat.  You may resume your normal diet and medications unless otherwise indicated.  Drink lots of extra fluids today and tomorrow.  4. The incidence of headache, nausea, or vomiting is about 5% (one in 20 patients).  If you develop a headache, lie flat and drink plenty of fluids until the headache goes away.  Caffeinated beverages may be helpful.  If you develop severe nausea and vomiting or a headache that does not go away with flat bed rest, call [phone].  5. You may resume normal activities after your 24 hours of bed rest is over; however, do not exert yourself strongly or do any heavy lifting tomorrow. If when you get up you have a headache when standing, go back to bed and force fluids for another 24 hours.  6. Call your physician for a follow-up appointment.  The results of your myelogram will be sent directly to your physician by the following day.  7. If you have any questions or if complications develop after you arrive home, please call [phone].  Discharge instructions have been explained to the patient.  The patient, or the person responsible for the patient, fully understands these instructions.       May resume Pradax today.

## 2015-10-23 ENCOUNTER — Other Ambulatory Visit: Payer: Self-pay | Admitting: Neurosurgery

## 2015-10-23 DIAGNOSIS — M47812 Spondylosis without myelopathy or radiculopathy, cervical region: Secondary | ICD-10-CM

## 2015-10-23 DIAGNOSIS — M48061 Spinal stenosis, lumbar region without neurogenic claudication: Secondary | ICD-10-CM

## 2015-11-04 ENCOUNTER — Ambulatory Visit
Admission: RE | Admit: 2015-11-04 | Discharge: 2015-11-04 | Disposition: A | Discharge status: Home / Self Care | Payer: Medicaid Other | Source: Ambulatory Visit | Attending: Neurosurgery | Admitting: Neurosurgery

## 2015-11-04 ENCOUNTER — Encounter: Payer: Self-pay | Admitting: Radiology

## 2015-11-04 VITALS — BP 140/87 | HR 49

## 2015-11-04 DIAGNOSIS — M48061 Spinal stenosis, lumbar region without neurogenic claudication: Secondary | ICD-10-CM

## 2015-11-04 DIAGNOSIS — M47812 Spondylosis without myelopathy or radiculopathy, cervical region: Secondary | ICD-10-CM

## 2015-11-04 DIAGNOSIS — M48062 Spinal stenosis, lumbar region with neurogenic claudication: Secondary | ICD-10-CM

## 2015-11-04 MED ORDER — DIAZEPAM 5 MG PO TABS
10.0000 mg | ORAL_TABLET | Freq: Once | ORAL | Status: AC
  Administered 2015-11-04: 10 mg via ORAL

## 2015-11-04 MED ORDER — IOHEXOL 300 MG/ML  SOLN
10.0000 mL | Freq: Once | INTRAMUSCULAR | Status: AC | PRN
  Administered 2015-11-04: 10 mL via INTRATHECAL

## 2015-11-04 MED ORDER — MEPERIDINE HCL 100 MG/ML IJ SOLN
100.0000 mg | Freq: Once | INTRAMUSCULAR | Status: AC
  Administered 2015-11-04: 100 mg via INTRAMUSCULAR

## 2015-11-04 MED ORDER — ONDANSETRON HCL 4 MG/2ML IJ SOLN
4.0000 mg | Freq: Once | INTRAMUSCULAR | Status: AC
  Administered 2015-11-04: 4 mg via INTRAMUSCULAR

## 2015-11-04 NOTE — Discharge Instructions (Signed)

## 2015-11-04 NOTE — Progress Notes (Signed)
Pt states he has been off Pradaxa for the past 2 days at least.  Discharge instructions explained to pt.

## 2015-11-11 ENCOUNTER — Ambulatory Visit: Payer: Medicaid Other | Admitting: Internal Medicine

## 2015-11-12 ENCOUNTER — Ambulatory Visit (INDEPENDENT_AMBULATORY_CARE_PROVIDER_SITE_OTHER)
Admission: RE | Admit: 2015-11-12 | Discharge: 2015-11-12 | Disposition: A | Discharge status: Home / Self Care | Payer: Medicaid Other | Source: Ambulatory Visit | Attending: Internal Medicine | Admitting: Internal Medicine

## 2015-11-12 ENCOUNTER — Ambulatory Visit (INDEPENDENT_AMBULATORY_CARE_PROVIDER_SITE_OTHER): Payer: Medicaid Other | Admitting: Internal Medicine

## 2015-11-12 ENCOUNTER — Encounter: Payer: Self-pay | Admitting: Internal Medicine

## 2015-11-12 VITALS — BP 126/80 | HR 50 | Ht 71.0 in | Wt 281.6 lb

## 2015-11-12 DIAGNOSIS — M546 Pain in thoracic spine: Secondary | ICD-10-CM | POA: Diagnosis not present

## 2015-11-12 DIAGNOSIS — M5489 Other dorsalgia: Secondary | ICD-10-CM

## 2015-11-12 DIAGNOSIS — M549 Dorsalgia, unspecified: Secondary | ICD-10-CM

## 2015-11-12 DIAGNOSIS — J309 Allergic rhinitis, unspecified: Secondary | ICD-10-CM

## 2015-11-12 DIAGNOSIS — G4733 Obstructive sleep apnea (adult) (pediatric): Secondary | ICD-10-CM | POA: Diagnosis not present

## 2015-11-12 DIAGNOSIS — J302 Other seasonal allergic rhinitis: Secondary | ICD-10-CM

## 2015-11-12 DIAGNOSIS — J3089 Other allergic rhinitis: Secondary | ICD-10-CM

## 2015-11-12 NOTE — Assessment & Plan Note (Signed)
He has had imaging evaluating for degenerative disc disease including C-spine. Without history of over exertion or trauma, current discomfort may be from nerve root irritation. In this former smoker will get chest x-ray to exclude an upper lung process. Plan chest x-ray

## 2015-11-12 NOTE — Assessment & Plan Note (Signed)
Azelastine nasal spray may be overdrying. Educated to use Flonase as a maintenance spray, 2 puffs twice daily for now. DC azelastine.

## 2015-11-12 NOTE — Assessment & Plan Note (Addendum)
Download confirms excellent compliance and control with CPAP auto 10-20. Pressures are comfortable. Because of his complaint of dryness we are going to reduce the airflow little bit to 10-15, adjust humidifier

## 2015-11-12 NOTE — Progress Notes (Signed)
HPI  04/19/14- Dr Gwenette Greet Patient comes in today for follow-up of his obstructive sleep apnea. He is wearing C Pap compliantly, and is having no issues with his mask fit or pressure. He is having some issues with dryness as well as soreness in his left nostril. He is using his humidifier, but is unsure if he has a heated hose. He feels that he sleeps well with the device, and has lost 25 pounds since the last visit.  05/13/15- 53 yoM former smoker followed for OSA, complicated by CAD/CHF/PAFib/ pacemaker/ Pradaxa, allergic rhinitis, hyperthyroid NPSG 2010- AHI 21/ hr CPAP/ [facility] 14-  Full face mask He is been having some difficulty working with [facility] to get replacement for old machine. They may have been waiting on this visit for documentation. He has been very compliant, using CPAP all night every night and has been holding his old machine together literally with duct tape. He notices nasal stuffiness, dry mouth. Thinks poor mask seal contributes to the dry mouth. We also discussed humidifier function. He disliked Biotene.  11/12/2015-55 year old male former smoker followed for OSA, complicated by CAD/CHF/P A. fib/pacemaker/Pradaxa, allergic rhinitis, hyperthyroid CPAP auto 10-20/[facility]       New machine after last visit FOLLOWS FOR: DME: [facility]. Pt states he wears CPAP nightly. No new supplies needed at this time. DL attached. Very well with CPAP auto 10-20. Likes new machine. Complains of dry mouth but watery sniffing. Stuffy nose more than drainage. Complains of localized back pain one or 2 inches lateral to his spine right suprascapular area, increased by deep breath twisting, reaching. Being evaluated by Dr. Annette Stable for degenerative disc disease  ROS-see HPI   Negative unless "+" Constitutional:    weight loss, night sweats, fevers, chills, fatigue, lassitude. HEENT:    headaches, difficulty swallowing, tooth/dental problems, sore throat,       sneezing, itching, ear ache, +nasal congestion,  post nasal drip, snoring CV:    chest pain, orthopnea, PND, swelling in lower extremities, anasarca,                                                    dizziness, palpitations Resp:   shortness of breath with exertion or at rest.                productive cough,   non-productive cough, coughing up of blood.              change in color of mucus.  wheezing.   Skin:    rash or lesions. GI:  No-   heartburn, indigestion, abdominal pain, nausea, vomiting GU: . MS:   joint pain, stiffness,  Neuro-     nothing unusual Psych:  change in mood or affect.  depression or anxiety.   memory loss.  OBJ- Physical Exam General- Alert, Oriented, Affect-appropriate, Distress- none acute, + overweight Skin- rash-none, lesions- none, excoriation- none Lymphadenopathy- none Head- atraumatic            Eyes- Gross vision intact, PERRLA, conjunctivae and secretions clear            Ears- Hearing, canals-normal            Nose- , + turbinate edema, no-Septal dev, mucus, polyps, erosion, perforation             Throat- Mallampati IV , mucosa clear/not dry ,  drainage- none, tonsils- atrophic Neck- flexible , trachea midline, no stridor , thyroid nl, carotid no bruit Chest - symmetrical excursion , unlabored           Heart/CV- RRR , no murmur , no gallop  , no rub, nl s1 s2                           - JVD- none , edema- none, stasis changes- none, varices- none           Lung- clear to P&A, wheeze- none, cough- none , dullness-none, rub- none           Chest wall- L pacemaker Abd-  Br/ Gen/ Rectal- Not done, not indicated Extrem- cyanosis- none, clubbing, none, atrophy- none, strength- nl Neuro- grossly intact to observation

## 2015-11-12 NOTE — Patient Instructions (Signed)
Order- DME [facility]  Change auto range to 10- 15     Dx OSA  Try turning up your CPAP humidifier a bit to see if that helps the dry mouth  Try leaving off the Astepro/ azelastine nasal spray, which is drying. Instead- try using the Flonase/ fluticasone nasal spray 2 puffs each nostril, twice daily (morning and night) for a week straight. See if your nose works better like that.  Order- CXR   Former smoker, right suprascapular pain  Try heat and ibuprofen for the back pain, until you can discuss it with Dr Annette Stable

## 2015-11-13 NOTE — Progress Notes (Signed)
Quick Note:  Called and spoke with pt. Reviewed results and recs. Pt voiced understanding and had no further questions. ______ 

## 2015-11-18 ENCOUNTER — Encounter: Payer: Self-pay | Admitting: Internal Medicine

## 2015-11-23 ENCOUNTER — Other Ambulatory Visit: Payer: Self-pay | Admitting: Internal Medicine

## 2015-11-25 NOTE — Telephone Encounter (Signed)
New message       *STAT* If patient is at the pharmacy, call can be transferred to refill team.   1. Which medications need to be refilled? (please list name of each medication and dose if known) pradaxa, carvedilol, klor-con, simvastatin, methimazole 2. Which pharmacy/location (including street and city if local pharmacy) is medication to be sent to? [facility] [address]  3. Do they need a 30 day or 90 day supply? 90 day supply Pt want to know why he can no longer get 90 day supply of medication?   Please call pt when it has been called it

## 2015-11-25 NOTE — Telephone Encounter (Signed)
Follow Up   *STAT* If patient is at the pharmacy, call can be transferred to refill team.  1. Which medications need to be refilled? (please list name of each medication and dose if known) pradaxa, carvedilol, klor-con, simvastatin, methimazole  2. Which pharmacy/location (including street and city if local pharmacy) is medication to be sent to? [facility] [address]  3. Do they need a 30 day or 90 day supply? 90 day supply    Pt want to know why he can no longer get 90 day supply of medication? Please call pt when it has been called it

## 2015-12-23 ENCOUNTER — Telehealth: Payer: Self-pay | Admitting: Internal Medicine

## 2015-12-23 ENCOUNTER — Other Ambulatory Visit: Payer: Self-pay | Admitting: Internal Medicine

## 2015-12-23 MED ORDER — POTASSIUM CHLORIDE CRYS ER 20 MEQ PO TBCR: 20.0000 meq | EXTENDED_RELEASE_TABLET | Freq: Every day | ORAL | Status: DC

## 2015-12-23 MED ORDER — DABIGATRAN ETEXILATE MESYLATE 150 MG PO CAPS: ORAL_CAPSULE | ORAL | Status: DC

## 2015-12-23 NOTE — Telephone Encounter (Signed)
New message     Pt want to talk to a nurse.  He would only say there is a problem with his medications

## 2015-12-24 ENCOUNTER — Other Ambulatory Visit: Payer: Self-pay | Admitting: Internal Medicine

## 2015-12-25 ENCOUNTER — Other Ambulatory Visit: Payer: Self-pay | Admitting: Internal Medicine

## 2015-12-26 ENCOUNTER — Encounter: Payer: Medicaid Other | Admitting: *Deleted

## 2015-12-27 ENCOUNTER — Telehealth: Payer: Self-pay | Admitting: *Deleted

## 2015-12-27 ENCOUNTER — Other Ambulatory Visit: Payer: Self-pay | Admitting: Internal Medicine

## 2015-12-27 NOTE — Telephone Encounter (Signed)
Called patient to advise that he send a remote transmission from home on 01/13/16 to stay on schedule.  He also needs to keep his scheduled appointment with Dr. [name] on 03/30/16.  Patient verbalizes understanding of instructions and denies additional questions or concerns at this time.

## 2016-01-10 ENCOUNTER — Telehealth: Payer: Self-pay | Admitting: Internal Medicine

## 2016-01-10 NOTE — Telephone Encounter (Signed)
Will forward Dr [name] and his nurse for review.  I do not see where this has been addressed in a previous note.

## 2016-01-10 NOTE — Telephone Encounter (Signed)
New message     CoRequest for surgical clearance:  What type of surgery is being performed? Epidural injection  When is this surgery scheduled?pending clearence  Are there any medications that need to be held prior to surgery and how long?Preadaxa how long for the pt to be off medication prior to porcedure  Name of physician performing surgery? Dr. Maryjean Ka  What is your office phone and fax number? [phone] ext: [phone]/forgot to get fax number   The office has faxed over the referral for May 9 th and May 10 th without any response

## 2016-01-11 NOTE — Telephone Encounter (Signed)
This is been addressed previously. We had not seen the patient in the year. Pradaxa should be held for 5 doses prior to injection

## 2016-01-13 ENCOUNTER — Ambulatory Visit (INDEPENDENT_AMBULATORY_CARE_PROVIDER_SITE_OTHER): Payer: Medicaid Other | Admitting: *Deleted

## 2016-01-13 ENCOUNTER — Telehealth: Payer: Self-pay | Admitting: Cardiology

## 2016-01-13 DIAGNOSIS — I495 Sick sinus syndrome: Secondary | ICD-10-CM

## 2016-01-13 NOTE — Telephone Encounter (Signed)
Fax # [phone]

## 2016-01-13 NOTE — Telephone Encounter (Signed)
Spoke with pt and reminded pt of remote transmission that is due today. Pt verbalized understanding.   

## 2016-01-13 NOTE — Telephone Encounter (Signed)
Will take to MR to be faxed to number listed.

## 2016-01-14 NOTE — Progress Notes (Signed)
Remote pacemaker transmission.   

## 2016-01-16 ENCOUNTER — Encounter: Payer: Self-pay | Admitting: Internal Medicine

## 2016-01-16 NOTE — Progress Notes (Signed)
Fax received from [place]. Patient is needing Epidural Steroid Injection and will need to hold pradaxa. OK per Dr. [name] to hold Pradaxa x 5 doses. Faxed form to [phone]- confirmation received.

## 2016-01-22 ENCOUNTER — Telehealth: Payer: Self-pay | Admitting: Internal Medicine

## 2016-01-22 NOTE — Telephone Encounter (Signed)
Will forward to pharmacists to review.

## 2016-01-22 NOTE — Telephone Encounter (Signed)
Clarified with dentist office. Patient is having 2 teeth extracted. Takes Pradaxa for afib with CHADS2 score of 2, no hx of stroke. Has had a PE in 2004. Patient ok to hold Pradaxa x 24 hours prior to extractions. Clearance faxed to Dr. Rolly Salter office fax # [phone].

## 2016-01-22 NOTE — Telephone Encounter (Signed)
New message     Request for surgical clearance:  1. What type of surgery is being performed? Tooth Exaceration  When is this surgery scheduled? May 30 th  2. Are there any medications that need to be held prior to surgery and how long? Pradaxa how long the pt needs to be off medication  3. Name of physician performing surgery? Dr. Ivin Poot  4. What is your office phone and fax number? [phone] pt uncertain   1. What dental office are you calling from? Dr. Ivin Poot  2. What is your office phone and fax number? [phone] pt uncertain  3. What type of procedure is the patient having performed? Tooth exaceration  4. What date is procedure scheduled? Tuesday May 30 th  5. What is your question (ex. Antibiotics prior to procedure, holding medication-we need to know how long dentist wants pt to hold med)? Just about the blood thinner

## 2016-01-23 ENCOUNTER — Telehealth: Payer: Self-pay | Admitting: Internal Medicine

## 2016-01-23 NOTE — Telephone Encounter (Signed)
Follow-up     The pt is calling back today yesterday the pt called about stop his blood thinner but never heard back from anyone, and the pt states he has been up all night, hurting. The pt states he needs to here from someone today please about when to stop.   1. What dental office are you calling from?per pt the pt did not provide the information  2. What is your office phone and fax number? [phone]  What type of procedure is the patient having performed? Tooth extraction  3. What date is procedure scheduled? May 30 th   4. What is your question (ex. Antibiotics prior to procedure, holding medication-we need to know how long dentist wants pt to hold med)? How long to hold Pardaxa for the pt

## 2016-01-23 NOTE — Telephone Encounter (Signed)
Spoke with pt and informed him that clearance was faxed over to dentist office yesterday. Informed pt that [name], [facility] reviewed and said "Patient ok to hold Pradaxa x 24 hours prior to extractions." Pt verbalized understanding and was in agreement with this plan.

## 2016-01-26 ENCOUNTER — Other Ambulatory Visit: Payer: Self-pay | Admitting: Internal Medicine

## 2016-01-28 NOTE — Telephone Encounter (Signed)
Approved      Disp Refills Start End    dabigatran (PRADAXA) 150 MG CAPS capsule 60 capsule 0 12/23/2015     Sig:  TAKE 1 CAPSULE (150 MG TOTAL) BY MOUTH EVERY 12 (TWELVE) HOURS.    Class:  Normal    DAW:  No    Comment:  Please call [phone] to schedule appointment for additional refills thanks. (1st attempt 11/25/15)    Authorizing Provider:  Deboraha Sprang, MD    Ordering User:  Katrine Coho, RN    potassium chloride SA (KLOR-CON M20) 20 MEQ tablet 30 tablet 0 12/23/2015     Sig - Route:  Take 1 tablet (20 mEq total) by mouth daily. - Oral    Class:  Normal    DAW:  No    Comment:  Please call [phone] to schedule appointment for additional refills thanks. (1st attempt 11/25/15)    Authorizing Provider:  Deboraha Sprang, MD    Ordering User:  Katrine Coho, RN      Visit Pharmacy     [id number] [place], [place]

## 2016-01-31 LAB — CUP PACEART REMOTE DEVICE CHECK
Battery Impedance: 339 Ohm
Battery Remaining Longevity: 131 mo
Battery Voltage: 2.79 V
Brady Statistic RA Percent Paced: 23 %
Date Time Interrogation Session: 20170515225831
Implantable Lead Implant Date: 20091023
Implantable Lead Location: 753859
Implantable Lead Model: 5076
MDC IDC MSMT LEADCHNL RA IMPEDANCE VALUE: 465 Ohm
MDC IDC MSMT LEADCHNL RA SENSING INTR AMPL: 1.4 mV
MDC IDC MSMT LEADCHNL RV IMPEDANCE VALUE: 67 Ohm
MDC IDC SET LEADCHNL RA PACING AMPLITUDE: 2 V

## 2016-02-03 ENCOUNTER — Ambulatory Visit (INDEPENDENT_AMBULATORY_CARE_PROVIDER_SITE_OTHER): Payer: Medicaid Other | Admitting: Physician Assistant

## 2016-02-03 ENCOUNTER — Encounter: Payer: Self-pay | Admitting: Physician Assistant

## 2016-02-03 VITALS — BP 138/98 | HR 53 | Ht 71.0 in | Wt 278.0 lb

## 2016-02-03 DIAGNOSIS — I5042 Chronic combined systolic (congestive) and diastolic (congestive) heart failure: Secondary | ICD-10-CM | POA: Diagnosis not present

## 2016-02-03 DIAGNOSIS — I255 Ischemic cardiomyopathy: Secondary | ICD-10-CM

## 2016-02-03 DIAGNOSIS — I10 Essential (primary) hypertension: Secondary | ICD-10-CM

## 2016-02-03 DIAGNOSIS — E785 Hyperlipidemia, unspecified: Secondary | ICD-10-CM

## 2016-02-03 DIAGNOSIS — Z7901 Long term (current) use of anticoagulants: Secondary | ICD-10-CM

## 2016-02-03 DIAGNOSIS — Z950 Presence of cardiac pacemaker: Secondary | ICD-10-CM

## 2016-02-03 DIAGNOSIS — I480 Paroxysmal atrial fibrillation: Secondary | ICD-10-CM

## 2016-02-03 DIAGNOSIS — Z01818 Encounter for other preprocedural examination: Secondary | ICD-10-CM | POA: Diagnosis not present

## 2016-02-03 LAB — COMPREHENSIVE METABOLIC PANEL
ALBUMIN: 4.2 g/dL (ref 3.6–5.1)
ALK PHOS: 73 U/L (ref 40–115)
ALT: 20 U/L (ref 9–46)
AST: 22 U/L (ref 10–35)
BILIRUBIN TOTAL: 0.7 mg/dL (ref 0.2–1.2)
BUN: 11 mg/dL (ref 7–25)
CALCIUM: 9.4 mg/dL (ref 8.6–10.3)
CO2: 24 mmol/L (ref 20–31)
Chloride: 106 mmol/L (ref 98–110)
Creat: 1.04 mg/dL (ref 0.70–1.33)
Glucose, Bld: 98 mg/dL (ref 65–99)
Potassium: 4.6 mmol/L (ref 3.5–5.3)
Sodium: 138 mmol/L (ref 135–146)
Total Protein: 6.7 g/dL (ref 6.1–8.1)

## 2016-02-03 LAB — CBC
HCT: 47.1 % (ref 38.5–50.0)
Hemoglobin: 15.6 g/dL (ref 13.2–17.1)
MCH: 26.5 pg — ABNORMAL LOW (ref 27.0–33.0)
MCHC: 33.1 g/dL (ref 32.0–36.0)
MCV: 80 fL (ref 80.0–100.0)
MPV: 10.1 fL (ref 7.5–12.5)
PLATELETS: 237 10*3/uL (ref 140–400)
RBC: 5.89 MIL/uL — AB (ref 4.20–5.80)
RDW: 16.6 % — ABNORMAL HIGH (ref 11.0–15.0)
WBC: 3.8 10*3/uL (ref 3.8–10.8)

## 2016-02-03 LAB — LIPID PANEL
CHOL/HDL RATIO: 3.5 ratio (ref <=5.0)
CHOLESTEROL: 163 mg/dL (ref 125–200)
HDL: 47 mg/dL (ref >=40)
LDL Cholesterol: 102 mg/dL (ref <130)
Triglycerides: 68 mg/dL (ref <150)
VLDL: 14 mg/dL (ref <30)

## 2016-02-03 NOTE — Patient Instructions (Addendum)
Medication Instructions:   Your physician recommends that you continue on your current medications as directed. Please refer to the Current Medication list given to you today.  FOR PROCEDURE ONLY  : MAY HOLD PRADAXA UP TO 3 DAYS IF NEEDED PRIOR TO PROCEDURE  If you need a refill on your cardiac medications before your next appointment, please call your pharmacy.  Labwork: CBC CMET LIPID    Testing/Procedures:  NONE ORDER TODAY    Follow-Up:  Your physician wants you to follow-up in:  [place] will receive a reminder letter in the mail two months in advance. If you don't receive a letter, please call our office to schedule the follow-up appointment.      Any Other Special Instructions Will Be Listed Below (If Applicable).

## 2016-02-03 NOTE — Progress Notes (Signed)
Cardiology Office Note    Date:  02/03/2016   ID:  [name], DOB [birth year], MRN [id number]  PCP:  [name], MD  Cardiologist:  Dr. [name]  Chief complaint preoperative clearance  History of Present Illness:  [name] is a 55 y.o. male  With history of afib ablation by Dr. Rayann Heman in 2012. He continues to have atrial arrhythmias.with fast ventricular rates and presyncope. He saw Dr. Susy Manor and metoprolol was added. He's also on Tikosyn. He has chronic systolic and diastolic heart failure EF 35% on echo in 2012. EF 50-55% with grade 1 DD on echo 12/13/14. History of non-STEMI possible type II as there was no obstructive lesions noted. Catheterization done 2006 and 2009 showed nonobstructive CAD. Myoview in 2011 was nonischemic. He has a history of pulmonary embolism. He is status post pacemaker for sick sinus syndrome. He saw Dr. Rayann Heman 11/2014 for possible A. fib ablation which he has decided not to do.  Eventually stop the metoprolol and has not had any symptoms over the past year. He walks 1 mile, swims and lifts weights. His last pacer check 3 months ago he only had A. fib 8% of the time. He has had no presyncope. His main problem is back pain when standing and sitting for any period of time. He is to have 2 injections and needs to come off the Pradaxa. He was off the Pradaxa 2 weeks ago for tooth extraction. He denies chest pain, palpitations, dyspnea, dyspnea on exertion, dizziness or presyncope.   Past Medical History  Diagnosis Date  . GERD (gastroesophageal reflux disease)   . HTN (hypertension)   . Obesity   . CAD -nonobstructive     S/P nstemi-type II cath 05/2008 - nonobs. dzs.  . Pulmonary embolism ([place]) 04/2003    Hx of  . Atrial fibrillation ([place])     s/p afib ablation  PVI 5/12 JA  . Sinus node dysfunction (HCC)     s/p MDT PPM  . Hyperlipemia   . Systolic and diastolic CHF, chronic ([place])     EF 35% by most recent echo 09/19/10  . PUD (peptic ulcer  disease)     gastritis due to ETOH previously  . Hyperthyroidism     Graves dz on methimazole and followed by [name]  . Polysubstance abuse     cocaine, last used 1999  . Pancreatitis   . Rectal bleeding   . Hemorrhoids   . Pacemaker-medtronic- AAI     Dual chamber V port plugged   . DDD (degenerative disc disease)     evaluate by neurosurgery is ongoing  . Complication of anesthesia   . PONV (postoperative nausea and vomiting)     2010 after went home -infection  . Anginal pain ([place])     freq  goes away quickly  . Depression     ptsd  . Erectile dysfunction   . Shortness of breath     LYING DOWN AT TIMES  . Sleep apnea     uses CPAP [facility] 2 YRS  . Headache(784.0)   . 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 Pacemaker insertion  10/09    by Dr [name]  . Fingers removed from right hand.  2/84    traumatic work injury  . Lumbar spine surgery  02/2009    Dr. [place] Gammon  . Atrial ablation surgery  12/2010    afib ablation by [name]  . Insert / replace / remove pacemaker      05/2008    . Cardiac catheterization      CADIAC ABLATION [name] 12/2010  . Anterior cervical decomp/discectomy fusion  12/15/2011    Procedure: ANTERIOR CERVICAL DECOMPRESSION/DISCECTOMY FUSION 1 LEVEL;  Surgeon: [name], MD;  Location: [facility];  Service: Neurosurgery;  Laterality: N/A;  Cervical Six-Seven Anterior Cervical Diskectomy fusion with Allograft and Plating  . Back surgery      Current Medications: Outpatient Prescriptions Prior to Visit  Medication Sig Dispense Refill  . Azelastine HCl (ASTEPRO) 0.15 % SOLN Place 2 sprays into both nostrils daily as needed (allergies). 30 mL prn  . carvedilol (COREG) 25 MG tablet Take 1 tablet (25 mg total) by mouth 2 (two) times daily with a meal. Pt needs to call the office and schedule a follow up appointment for any future refills [phone]. (1st attempt) 60  tablet 0  . carvedilol (COREG) 25 MG tablet TAKE 1 TABLET (25 MG TOTAL) BY MOUTH 2 (TWO) TIMES DAILY WITH A MEAL. 60 tablet 2  . cyclobenzaprine (FLEXERIL) 10 MG tablet Take 10 mg by mouth 2 (two) times daily. For muscle spams    . dabigatran (PRADAXA) 150 MG CAPS capsule Take 1 capsule (150 mg total) by mouth 2 (two) times daily. 22 capsule 0  . dofetilide (TIKOSYN) 500 MCG capsule Take 1 capsule (500 mcg total) by mouth 2 (two) times daily. 180 capsule 2  . fluticasone (FLONASE) 50 MCG/ACT nasal spray Place 2 sprays into both nostrils daily as needed for allergies. 16 g prn  . gabapentin (NEURONTIN) 100 MG capsule Take 100 mg by mouth every morning.     . methimazole (TAPAZOLE) 10 MG tablet Take 1 tablet (10 mg total) by mouth daily. 30 tablet 1  . nitroGLYCERIN (NITROSTAT) 0.4 MG SL tablet Place 0.4 mg under the tongue every 5 (five) minutes as needed (MAX 3 TABLETS). For chest pain    . omeprazole (PRILOSEC) 40 MG capsule Take 40 mg by mouth daily.     [name] oxyCODONE-acetaminophen (PERCOCET) 10-325 MG per tablet Take 1 tablet by mouth every 4 (four) hours as needed for pain. 29 tablet 0  . potassium chloride SA (KLOR-CON M20) 20 MEQ tablet Take 1 tablet (20 mEq total) by mouth daily. 30 tablet 0  . sildenafil (VIAGRA) 100 MG tablet Take 100 mg by mouth daily as needed. For erectile dysfunction    . simvastatin (ZOCOR) 40 MG tablet TAKE 1 TABLET (40 MG TOTAL) BY MOUTH DAILY. 30 tablet 11  . metoprolol tartrate (LOPRESSOR) 25 MG tablet Take 1.5 tablets (37.5 mg total) by mouth 2 (two) times daily. 90 tablet 6   No facility-administered medications prior to visit.     Allergies:   Celebrex; Hydrocodone-acetaminophen; and Prednisolone   Social History   Social History  . Marital Status: Legally Separated    Spouse Name: N/A  . Number of Children: 7  . Years of Education: N/A   Occupational History  . unemployed    Social History Main Topics  . Smoking status: Former Smoker -- 1.00  packs/day for 15 years    Types: Cigarettes    Quit date: 04/04/2003  . Smokeless tobacco: Never Used  . Alcohol Use: No     Comment: formerly heavy ETOH,  now drinks a fifth of liquor every 2 weeks  . Drug Use: No  Comment: previously used crack cocaine, quit 1999,  + marijuana  . Sexual Activity: Not Asked   Other Topics Concern  . None   Social History Narrative   Unemployed currently.  Previously worked as a Dealer.  Lives in [place].     Family History:  The patient's family history includes Breast cancer in his mother; Heart disease in his mother; Lung cancer in his father. There is no history of Colon cancer.   ROS:   Please see the history of present illness.    Review of Systems  Constitution: Negative.  HENT: Positive for headaches.   Cardiovascular: Negative.   Respiratory: Negative.   Endocrine: Negative.   Hematologic/Lymphatic: Negative.   Musculoskeletal: Positive for back pain and myalgias.  Gastrointestinal: Negative.   Genitourinary: Negative.    All other systems reviewed and are negative.   PHYSICAL EXAM:   VS:  BP 138/98 mmHg  Pulse 53  Ht 5\' 11"  (1.803 m)  Wt 278 lb (126.1 kg)  BMI 38.79 kg/m2   GEN: Well nourished, well developed, in no acute distress Neck: no JVD, carotid bruits, or masses Cardiac: RRR; no murmurs, rubs, or gallops,no edema  Respiratory:  clear to auscultation bilaterally, normal work of breathing GI: soft, nontender, nondistended, + BS MS: no deformity or atrophy Skin: warm and dry, no rash Neuro:  Alert and Oriented x 3, Strength and sensation are intact Psych: euthymic mood, full affect  Wt Readings from Last 3 Encounters:  02/03/16 278 lb (126.1 kg)  11/12/15 281 lb 9.6 oz (127.733 kg)  05/13/15 273 lb 12.8 oz (124.195 kg)      Studies/Labs Reviewed:   EKG:  EKG is ordered today.  The ekg ordered today demonstrates sinus bradycardia at 53 bpm nonspecific ST-T wave changes, no acute change  Recent  Labs: No results found for requested labs within last 365 days.   Lipid Panel    Component Value Date/Time   CHOL  08/13/2010 0203    146        ATP III CLASSIFICATION:  <200     mg/dL   Desirable  200-239  mg/dL   Borderline High  >=240    mg/dL   High          TRIG 70 08/13/2010 0203   HDL 43 08/13/2010 0203   CHOLHDL 3.4 08/13/2010 0203   VLDL 14 08/13/2010 0203   LDLCALC  08/13/2010 0203    89        Total Cholesterol/HDL:CHD Risk Coronary Heart Disease Risk Table                     Men   Women  1/2 Average Risk   3.4   3.3  Average Risk       5.0   4.4  2 X Average Risk   9.6   7.1  3 X Average Risk  23.4   11.0        Use the calculated Patient Ratio above and the CHD Risk Table to determine the patient's CHD Risk.        ATP III CLASSIFICATION (LDL):  <100     mg/dL   Optimal  100-129  mg/dL   Near or Above                    Optimal  130-159  mg/dL   Borderline  160-189  mg/dL   High  >190     mg/dL  Very High   LDLDIRECT 163.8 02/08/2008 1003    Additional studies/ records that were reviewed today include:   2-D echo 12/13/14 Study Conclusions  - Left ventricle: The cavity size was normal. Wall thickness was   increased in a pattern of mild LVH. Systolic function was normal.   The estimated ejection fraction was in the range of 50% to 55%.   Diffuse hypokinesis. Doppler parameters are consistent with   abnormal left ventricular relaxation (grade 1 diastolic   dysfunction). - Left atrium: The atrium was moderately dilated. - Right ventricle: The cavity size was mildly dilated. Wall   thickness was normal. - Right atrium: The atrium was mildly dilated.  Impressions:  - EF mildly improved when compared to prior.    ASSESSMENT:    1. Pre-operative clearance   2. PAF (paroxysmal atrial fibrillation) (HCC)   3. COMBINED HEART FAILURE, CHRONIC   4. Cardiomyopathy, ischemic   5. Essential (primary) hypertension   6. Dyslipidemia   7.  PPM-Medtronic- 2009   8. Chronic anticoagulation-Pradaxa      PLAN:  In order of problems listed above:  Preoperative clearance: Patient needs to back injections for pain management.CHADSVASC=2. He is on Pradaxa for PAF. I spoke with Dr. [name] who agrees that the patient can come off Pradaxa for 1-3 days if needed for back injections.  PAF most recent remote check patient only had atrial fibrillation 8% of the time and he hasn't had any significant palpitations.  Combined CHF compensated  Ischemic cardiomyopathy was recent EF improved to 50-55% on echo in 11/2014 hypertension blood pressure is on the high side today but he is in pain. Continue to monitor  Dyslipidemia check fasting lipid panel  Medtronic pacemaker remote check 3 months ago was stable. He has follow-up at the end of July.   Chronic anticoagulation with Pradaxa stable to come off for back injection. Follow-up with Dr. [name] as scheduled      Medication Adjustments/Labs and Tests Ordered: Current medicines are reviewed at length with the patient today.  Concerns regarding medicines are outlined above.  Medication changes, Labs and Tests ordered today are listed in the Patient Instructions below. Patient Instructions  Medication Instructions:   Your physician recommends that you continue on your current medications as directed. Please refer to the Current Medication list given to you today.  FOR PROCEDURE ONLY  : MAY HOLD PRADAXA UP TO 3 DAYS IF NEEDED PRIOR TO PROCEDURE  If you need a refill on your cardiac medications before your next appointment, please call your pharmacy.  Labwork: CBC CMET LIPID    Testing/Procedures:  NONE ORDER TODAY    Follow-Up:  Your physician wants you to follow-up in:  [place] will receive a reminder letter in the mail two months in advance. If you don't receive a letter, please call our office to schedule the follow-up appointment.      Any Other  Special Instructions Will Be Listed Below (If Applicable).  Sumner Boast, PA-C  02/03/2016 10:17 AM    [facility] [address], [place], [place]  [address] Phone: [phone]; Fax: [phone]

## 2016-02-04 ENCOUNTER — Other Ambulatory Visit: Payer: Self-pay | Admitting: Internal Medicine

## 2016-02-06 ENCOUNTER — Telehealth: Payer: Self-pay | Admitting: Internal Medicine

## 2016-02-06 NOTE — Telephone Encounter (Signed)
Returned pts call.  Pt made aware of lab results and verbalized understanding.

## 2016-02-06 NOTE — Telephone Encounter (Signed)
Calling about his lab results , please call   Thanks

## 2016-02-12 ENCOUNTER — Encounter: Payer: Self-pay | Admitting: Cardiology

## 2016-02-19 ENCOUNTER — Encounter (HOSPITAL_COMMUNITY): Payer: Self-pay

## 2016-02-19 ENCOUNTER — Ambulatory Visit (HOSPITAL_COMMUNITY)
Admission: EM | Admit: 2016-02-19 | Discharge: 2016-02-19 | Disposition: A | Discharge status: Home / Self Care | Payer: Medicaid Other | Attending: Emergency Medicine | Admitting: Emergency Medicine

## 2016-02-19 DIAGNOSIS — R509 Fever, unspecified: Secondary | ICD-10-CM | POA: Diagnosis present

## 2016-02-19 DIAGNOSIS — R531 Weakness: Secondary | ICD-10-CM | POA: Diagnosis not present

## 2016-02-19 DIAGNOSIS — N390 Urinary tract infection, site not specified: Secondary | ICD-10-CM | POA: Insufficient documentation

## 2016-02-19 DIAGNOSIS — G44209 Tension-type headache, unspecified, not intractable: Secondary | ICD-10-CM | POA: Insufficient documentation

## 2016-02-19 DIAGNOSIS — K219 Gastro-esophageal reflux disease without esophagitis: Secondary | ICD-10-CM | POA: Diagnosis not present

## 2016-02-19 DIAGNOSIS — R350 Frequency of micturition: Secondary | ICD-10-CM | POA: Diagnosis not present

## 2016-02-19 LAB — POCT URINALYSIS DIP (DEVICE)
Bilirubin Urine: NEGATIVE
GLUCOSE, UA: NEGATIVE mg/dL
KETONES UR: NEGATIVE mg/dL
Nitrite: NEGATIVE
PROTEIN: NEGATIVE mg/dL
SPECIFIC GRAVITY, URINE: 1.02 (ref 1.005–1.030)
Urobilinogen, UA: 1 mg/dL (ref 0.0–1.0)
pH: 7 (ref 5.0–8.0)

## 2016-02-19 LAB — POCT I-STAT, CHEM 8
BUN: 17 mg/dL (ref 6–20)
CHLORIDE: 101 mmol/L (ref 101–111)
Calcium, Ion: 1.2 mmol/L (ref 1.12–1.23)
Creatinine, Ser: 1.1 mg/dL (ref 0.61–1.24)
GLUCOSE: 97 mg/dL (ref 65–99)
HEMATOCRIT: 48 % (ref 39.0–52.0)
HEMOGLOBIN: 16.3 g/dL (ref 13.0–17.0)
POTASSIUM: 4 mmol/L (ref 3.5–5.1)
SODIUM: 139 mmol/L (ref 135–145)
TCO2: 27 mmol/L (ref 0–100)

## 2016-02-19 MED ORDER — CEPHALEXIN 500 MG PO CAPS: 500.0000 mg | ORAL_CAPSULE | Freq: Four times a day (QID) | ORAL | Status: DC

## 2016-02-19 NOTE — Discharge Instructions (Signed)
Fatigue Drink plenty of fluids and stay well-hydrated. Take your medicines as directed. If you are getting worse or develop new symptoms or problems seek medical attention promptly. Home and rest. Fatigue is feeling tired all of the time, a lack of energy, or a lack of motivation. Occasional or mild fatigue is often a normal response to activity or life in general. However, long-lasting (chronic) or extreme fatigue may indicate an underlying medical condition. HOME CARE INSTRUCTIONS  Watch your fatigue for any changes. The following actions may help to lessen any discomfort you are feeling:  Talk to your health care provider about how much sleep you need each night. Try to get the required amount every night.  Take medicines only as directed by your health care provider.  Eat a healthy and nutritious diet. Ask your health care provider if you need help changing your diet.  Drink enough fluid to keep your urine clear or pale yellow.  Practice ways of relaxing, such as yoga, meditation, massage therapy, or acupuncture.  Exercise regularly.   Change situations that cause you stress. Try to keep your work and personal routine reasonable.  Do not abuse illegal drugs.  Limit alcohol intake to no more than 1 drink per day for nonpregnant women and 2 drinks per day for men. One drink equals 12 ounces of beer, 5 ounces of wine, or 1 ounces of hard liquor.  Take a multivitamin, if directed by your health care provider. SEEK MEDICAL CARE IF:   Your fatigue does not get better.  You have a fever.   You have unintentional weight loss or gain.  You have headaches.   You have difficulty:   Falling asleep.  Sleeping throughout the night.  You feel angry, guilty, anxious, or sad.   You are unable to have a bowel movement (constipation).   You skin is dry.   Your legs or another part of your body is swollen.  SEEK IMMEDIATE MEDICAL CARE IF:   You feel confused.   Your  vision is blurry.  You feel faint or pass out.   You have a severe headache.   You have severe abdominal, pelvic, or back pain.   You have chest pain, shortness of breath, or an irregular or fast heartbeat.   You are unable to urinate or you urinate less than normal.   You develop abnormal bleeding, such as bleeding from the rectum, vagina, nose, lungs, or nipples.  You vomit blood.   You have thoughts about harming yourself or committing suicide.   You are worried that you might harm someone else.    This information is not intended to replace advice given to you by your health care provider. Make sure you discuss any questions you have with your health care provider.   Document Released: 06/14/2007 Document Revised: 09/07/2014 Document Reviewed: 12/19/2013 Elsevier Interactive Patient Education 2016 [employer]  Tension Headache A tension headache is pain, pressure, or aching that is felt over the front and sides of your head. These headaches can last from 30 minutes to several days. HOME CARE Managing Pain  Take over-the-counter and prescription medicines only as told by your doctor.  Lie down in a dark, quiet room when you have a headache.  If directed, apply ice to your head and neck area:  Put ice in a plastic bag.  Place a towel between your skin and the bag.  Leave the ice on for 20 minutes, 2-3 times per day.  Use a heating  pad or a hot shower to apply heat to your head and neck area as told by your doctor. Eating and Drinking  Eat meals on a regular schedule.  Do not drink a lot of alcohol.  Do not use a lot of caffeine, or stop using caffeine. General Instructions  Keep all follow-up visits as told by your doctor. This is important.  Keep a journal to find out if certain things bring on headaches. For example, write down:  What you eat and drink.  How much sleep you get.  Any change to your diet or medicines.  Try getting a massage,  or doing other things that help you to relax.  Lessen stress.  Sit up straight. Do not tighten (tense) your muscles.  Do not use tobacco products. This includes cigarettes, chewing tobacco, or e-cigarettes. If you need help quitting, ask your doctor.  Exercise regularly as told by your doctor.  Get enough sleep. This may mean 7-9 hours of sleep. GET HELP IF:  Your symptoms are not helped by medicine.  You have a headache that feels different from your usual headache.  You feel sick to your stomach (nauseous) or you throw up (vomit).  You have a fever. GET HELP RIGHT AWAY IF:  Your headache becomes very bad.  You keep throwing up.  You have a stiff neck.  You have trouble seeing.  You have trouble speaking.  You have pain in your eye or ear.  Your muscles are weak or you lose muscle control.  You lose your balance or you have trouble walking.  You feel like you will pass out (faint) or you pass out.  You have confusion.   This information is not intended to replace advice given to you by your health care provider. Make sure you discuss any questions you have with your health care provider.   Document Released: 11/11/2009 Document Revised: 05/08/2015 Document Reviewed: 12/10/2014 Elsevier Interactive Patient Education 2016 [employer]  Urinary Tract Infection A urinary tract infection (UTI) can occur any place along the urinary tract. The tract includes the kidneys, ureters, bladder, and urethra. A type of germ called bacteria often causes a UTI. UTIs are often helped with antibiotic medicine.  HOME CARE   If given, take antibiotics as told by your doctor. Finish them even if you start to feel better.  Drink enough fluids to keep your pee (urine) clear or pale yellow.  Avoid tea, drinks with caffeine, and bubbly (carbonated) drinks.  Pee often. Avoid holding your pee in for a long time.  Pee before and after having sex (intercourse).  Wipe from front to  back after you poop (bowel movement) if you are a woman. Use each tissue only once. GET HELP RIGHT AWAY IF:   You have back pain.  You have lower belly (abdominal) pain.  You have chills.  You feel sick to your stomach (nauseous).  You throw up (vomit).  Your burning or discomfort with peeing does not go away.  You have a fever.  Your symptoms are not better in 3 days. MAKE SURE YOU:   Understand these instructions.  Will watch your condition.  Will get help right away if you are not doing well or get worse.   This information is not intended to replace advice given to you by your health care provider. Make sure you discuss any questions you have with your health care provider.   Document Released: 02/03/2008 Document Revised: 09/07/2014 Document Reviewed: 03/17/2012 Elsevier Interactive  Patient Education [employer].  Urinary Frequency The number of times a normal person urinates depends upon how much liquid they take in and how much liquid they are losing. If the temperature is hot and there is high humidity, then the person will sweat more and usually breathe a little more frequently. These factors decrease the amount of frequency of urination that would be considered normal. The amount you drink is easily determined, but the amount of fluid lost is sometimes more difficult to calculate.  Fluid is lost in two ways:  Sensible fluid loss is usually measured by the amount of urine that you get rid of. Losses of fluid can also occur with diarrhea.  Insensible fluid loss is more difficult to measure. It is caused by evaporation. Insensible loss of fluid occurs through breathing and sweating. It usually ranges from a little less than a quart to a little more than a quart of fluid a day. In normal temperatures and activity levels, the average person may urinate 4 to 7 times in a 24-hour period. Needing to urinate more often than that could indicate a problem. If one urinates  4 to 7 times in 24 hours and has large volumes each time, that could indicate a different problem from one who urinates 4 to 7 times a day and has small volumes. The time of urinating is also important. Most urinating should be done during the waking hours. Getting up at night to urinate frequently can indicate some problems. CAUSES  The bladder is the organ in your lower abdomen that holds urine. Like a balloon, it swells some as it fills up. Your nerves sense this and tell you it is time to head for the bathroom. There are a number of reasons that you might feel the need to urinate more often than usual. They include:  Urinary tract infection. This is usually associated with other signs such as burning when you urinate.  In men, problems with the prostate (a walnut-size gland that is located near the tube that carries urine out of your body). There are two reasons why the prostate can cause an increased frequency of urination:  An enlarged prostate that does not let the bladder empty well. If the bladder only half empties when you urinate, then it only has half the capacity to fill before you have to urinate again.  The nerves in the bladder become more hypersensitive with an increased size of the prostate even if the bladder empties completely.  Pregnancy.  Obesity. Excess weight is more likely to cause a problem for women than for men.  Bladder stones or other bladder problems.  Caffeine.  Alcohol.  Medications. For example, drugs that help the body get rid of extra fluid (diuretics) increase urine production. Some other medicines must be taken with lots of fluids.  Muscle or nerve weakness. This might be the result of a spinal cord injury, a stroke, multiple sclerosis, or Parkinson disease.  Long-standing diabetes can decrease the sensation of the bladder. This loss of sensation makes it harder to sense the bladder needs to be emptied. Over a period of years, the bladder is stretched  out by constant overfilling. This weakens the bladder muscles so that the bladder does not empty well and has less capacity to fill with new urine.  Interstitial cystitis (also called painful bladder syndrome). This condition develops because the tissues that line the inside of the bladder are inflamed (inflammation is the body's way of reacting to injury  or infection). It causes pain and frequent urination. It occurs in women more often than in men. DIAGNOSIS   To decide what might be causing your urinary frequency, your health care provider will probably:  Ask about symptoms you have noticed.  Ask about your overall health. This will include questions about any medications you are taking.  Do a physical examination.  Order some tests. These might include:  A blood test to check for diabetes or other health issues that could be contributing to the problem.  Urine testing. This could measure the flow of urine and the pressure on the bladder.  A test of your neurological system (the brain, spinal cord, and nerves). This is the system that senses the need to urinate.  A bladder test to check whether it is emptying completely when you urinate.  Cystoscopy. This test uses a thin tube with a tiny camera on it. It offers a look inside your urethra and bladder to see if there are problems.  Imaging tests. You might be given a contrast dye and then asked to urinate. X-rays are taken to see how your bladder is working. TREATMENT  It is important for you to be evaluated to determine if the amount or frequency that you have is unusual or abnormal. If it is found to be abnormal, the cause should be determined and this can usually be found out easily. Depending upon the cause, treatment could include medication, stimulation of the nerves, or surgery. There are not too many things that you can do as an individual to change your urinary frequency. It is important that you balance the amount of fluid  intake needed to compensate for your activity and the temperature. Medical problems will be diagnosed and taken care of by your physician. There is no particular bladder training such as Kegel exercises that you can do to help urinary frequency. This is an exercise that is usually recommended for people who have leaking of urine when they laugh, cough, or sneeze. HOME CARE INSTRUCTIONS   Take any medications your health care provider prescribed or suggested. Follow the directions carefully.  Practice any lifestyle changes that are recommended. These might include:  Drinking less fluid or drinking at different times of the day. If you need to urinate often during the night, for example, you may need to stop drinking fluids early in the evening.  Cutting down on caffeine or alcohol. They both can make you need to urinate more often than normal. Caffeine is found in coffee, tea, and sodas.  Losing weight, if that is recommended.  Keep a journal or a log. You might be asked to record how much you drink and when and where you feel the need to urinate. This will also help evaluate how well the treatment provided by your physician is working. SEEK MEDICAL CARE IF:   Your need to urinate often gets worse.  You feel increased pain or irritation when you urinate.  You notice blood in your urine.  You have questions about any medications that your health care provider recommended.  You notice blood, pus, or swelling at the site of any test or treatment procedure.  You develop a fever of more than 100.23F (38.1C). SEEK IMMEDIATE MEDICAL CARE IF:  You develop a fever of more than 102.42F (38.9C).   This information is not intended to replace advice given to you by your health care provider. Make sure you discuss any questions you have with your health care provider.  Document Released: 06/13/2009 Document Revised: 09/07/2014 Document Reviewed: 06/13/2009 Elsevier Interactive Patient Education  2016 [employer] Inc.  Weakness Weakness is a lack of strength. You may feel weak all over your body or just in one part of your body. Weakness can be serious. In some cases, you may need more medical tests. HOME [place] a well-balanced diet.  Try to exercise every day.  Only take medicines as told by your doctor. GET HELP RIGHT AWAY IF:   You cannot do your normal daily activities.  You cannot walk up and down stairs, or you feel very tired when you do so.  You have shortness of breath or chest pain.  You have trouble moving parts of your body.  You have weakness in only one body part or on only one side of the body.  You have a fever.  You have trouble speaking or swallowing.  You cannot control when you pee (urinate) or poop (bowel movement).  You have black or bloody throw up (vomit) or poop.  Your weakness gets worse or spreads to other body parts.  You have new aches or pains. MAKE SURE YOU:   Understand these instructions.  Will watch your condition.  Will get help right away if you are not doing well or get worse.   This information is not intended to replace advice given to you by your health care provider. Make sure you discuss any questions you have with your health care provider.   Document Released: 07/30/2008 Document Revised: 02/16/2012 Document Reviewed: 10/16/2011 Elsevier Interactive Patient Education [employer].

## 2016-02-19 NOTE — ED Notes (Signed)
Patient states he has been running a fever associated with light headache since he woke up this morning, he has taken 1 acetaminophen-codeine #3. No acute distress

## 2016-02-19 NOTE — ED Notes (Signed)
Urine specimen obtained and in lab 

## 2016-02-19 NOTE — ED Provider Notes (Signed)
CSN: [id number]     Arrival date & time 02/19/16  1720 History   First MD Initiated Contact with Patient 02/19/16 1748     Chief Complaint  Patient presents with  . Fever  . Headache   (Consider location/radiation/quality/duration/timing/severity/associated sxs/prior Treatment) HPI Comments: 55 year old male states that today feeling unusually weak. He was dizzy for a short time and he has a light headache. He felt as though he may have had a fever today because he felt hot all over. Yesterday he had a bad case of acid reflux but this is an old diagnosis for him and recurrent. He is complaining of heaviness across the chest. Denies shortness of breath.  He denies having nausea vomiting or problems with vision, speech, hearing or swallowing. Denies photophobia or focal paresthesias or weakness. Denies any known tick exposure. Yesterday he was cutting grass but did not feel poorly at that time. He states he has a history of atrial fibrillation.  He has a significant past medical history to include GERD, hypertension, CAD-nonobstructive, non-STEMI type II, atrial fibrillation, sinus node dysfunction, systolic and diastolic CHF, PUD, hyperthyroidism, polysubstance abuse, pancreatitis, anginal pain, depression, dyspnea, headaches and a Medtronic pacemaker.   Past Medical History  Diagnosis Date  . GERD (gastroesophageal reflux disease)   . HTN (hypertension)   . Obesity   . CAD -nonobstructive     S/P nstemi-type II cath 05/2008 - nonobs. dzs.  . Pulmonary embolism ([place]) 04/2003    Hx of  . Atrial fibrillation ([place])     s/p afib ablation  PVI 5/12 JA  . Sinus node dysfunction ([facility])     s/p MDT PPM  . Hyperlipemia   . Systolic and diastolic CHF, chronic ([place])     EF 35% by most recent echo 09/19/10  . PUD (peptic ulcer disease)     gastritis due to ETOH previously  . Hyperthyroidism     Graves dz on methimazole and followed by [name]  . Polysubstance abuse     cocaine, last used 1999   . Pancreatitis   . Rectal bleeding   . Hemorrhoids   . Pacemaker-medtronic- AAI     Dual chamber V port plugged   . DDD (degenerative disc disease)     evaluate by neurosurgery is ongoing  . Complication of anesthesia   . PONV (postoperative nausea and vomiting)     2010 after went home -infection  . Anginal pain ([place])     freq  goes away quickly  . Depression     ptsd  . Erectile dysfunction   . Shortness of breath     LYING DOWN AT TIMES  . Sleep apnea     uses CPAP [facility] 2 YRS  . Headache(784.0)   . 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 Pacemaker insertion  10/09    by Dr [name]  . Fingers removed from right hand.  2/84    traumatic work injury  . Lumbar spine surgery  02/2009    Dr. Trenton Gammon  . Atrial ablation surgery  12/2010    afib ablation by [name]  . Insert / replace / remove pacemaker      05/2008    . Cardiac catheterization      CADIAC ABLATION [name] 12/2010  . Anterior cervical decomp/discectomy fusion  12/15/2011    Procedure: ANTERIOR CERVICAL DECOMPRESSION/DISCECTOMY FUSION 1 LEVEL;  Surgeon: [name], MD;  Location: [facility];  Service: Neurosurgery;  Laterality: N/A;  Cervical Six-Seven Anterior Cervical Diskectomy fusion with Allograft and Plating  . Back surgery     Family History  Problem Relation Age of Onset  . Heart disease Mother   . Breast cancer Mother   . Lung cancer Father   . Diabetes    . Colon cancer Neg Hx    Social History  Substance Use Topics  . Smoking status: Former Smoker -- 1.00 packs/day for 15 years    Types: Cigarettes    Quit date: 04/04/2003  . Smokeless tobacco: Never Used  . Alcohol Use: No     Comment: formerly heavy ETOH,  now drinks a fifth of liquor every 2 weeks    Review of Systems  Constitutional: Positive for activity change and fatigue. Negative for fever.  HENT: Negative for congestion, ear pain and sore throat.    Eyes: Negative for photophobia, pain and visual disturbance.  Respiratory: Positive for chest tightness. Negative for cough and shortness of breath.   Cardiovascular: Positive for chest pain. Negative for leg swelling.  Gastrointestinal: Negative for nausea, vomiting and abdominal pain.       Reflux symptoms.  Genitourinary: Positive for frequency. Negative for dysuria, urgency, flank pain, penile swelling and penile pain.       Initially the patient had denied having any urinary symptoms. Toward the end of the visit the patient states that he had been having some frequency of urination. He also stated that he been drinking a lot more fluids today. There was no associated dysuria. He states that sometimes when he urinates the volume is large and other times the volume is small.  Skin: Negative.   Neurological: Positive for weakness and headaches. Negative for speech difficulty.  Psychiatric/Behavioral: Negative.     Allergies  Celebrex; Hydrocodone-acetaminophen; and Prednisolone  Home Medications   Prior to Admission medications   Medication Sig Start Date End Date Taking? Authorizing Provider  carvedilol (COREG) 25 MG tablet Take 1 tablet (25 mg total) by mouth 2 (two) times daily with a meal. Pt needs to call the office and schedule a follow up appointment for any future refills [phone]. (1st attempt) 12/26/15  Yes Deboraha Sprang, MD  carvedilol (COREG) 25 MG tablet TAKE 1 TABLET (25 MG TOTAL) BY MOUTH 2 (TWO) TIMES DAILY WITH A MEAL. 12/27/15  Yes Deboraha Sprang, MD  dabigatran (PRADAXA) 150 MG CAPS capsule Take 1 capsule (150 mg total) by mouth 2 (two) times daily. 01/28/16  Yes Deboraha Sprang, MD  dofetilide (TIKOSYN) 500 MCG capsule Take 1 capsule (500 mcg total) by mouth 2 (two) times daily. 08/16/15  Yes Deboraha Sprang, MD  gabapentin (NEURONTIN) 100 MG capsule Take 100 mg by mouth every morning.    Yes Historical Provider, MD  KLOR-CON M20 20 MEQ tablet TAKE 1 TABLET (20 MEQ  TOTAL) BY MOUTH DAILY. 02/05/16  Yes Deboraha Sprang, MD  methimazole (TAPAZOLE) 10 MG tablet Take 1 tablet (10 mg total) by mouth daily. 11/06/14  Yes Renato Shin, MD  omeprazole (PRILOSEC) 40 MG capsule Take 40 mg by mouth daily.    Yes Historical Provider, MD  simvastatin (ZOCOR) 40 MG tablet TAKE 1 TABLET (40 MG TOTAL) BY MOUTH DAILY. 11/28/14  Yes Thayer Headings, MD  Azelastine HCl (ASTEPRO) 0.15 % SOLN Place 2 sprays into both nostrils daily as needed (allergies). 05/13/15   Deneise Lever, MD  cephALEXin (KEFLEX) 500 MG capsule Take 1 capsule (500 mg total) by mouth 4 (four) times daily. 02/19/16   Janne Napoleon, NP  cyclobenzaprine (FLEXERIL) 10 MG tablet Take 10 mg by mouth 2 (two) times daily. For muscle spams 08/17/14   Historical Provider, MD  fluticasone (FLONASE) 50 MCG/ACT nasal spray Place 2 sprays into both nostrils daily as needed for allergies. 05/13/15   Deneise Lever, MD  nitroGLYCERIN (NITROSTAT) 0.4 MG SL tablet Place 0.4 mg under the tongue every 5 (five) minutes as needed (MAX 3 TABLETS). For chest pain 06/18/11   Deboraha Sprang, MD  oxyCODONE-acetaminophen (PERCOCET) 10-325 MG per tablet Take 1 tablet by mouth every 4 (four) hours as needed for pain. 05/29/13   Antonietta Breach, PA-C  sildenafil (VIAGRA) 100 MG tablet Take 100 mg by mouth daily as needed. For erectile dysfunction    Historical Provider, MD   Meds Ordered and Administered this Visit  Medications - No data to display  BP 141/91 mmHg  Pulse 80  Temp(Src) 99.5 F (37.5 C) (Oral)  SpO2 96% No data found.   Physical Exam  Constitutional: He appears well-developed and well-nourished. No distress.  HENT:  Head: Normocephalic and atraumatic.  Mouth/Throat: Oropharynx is clear and moist. No oropharyngeal exudate.  Patient states that he is not having pain in the back of his head as part of his headache. Palpation to the splenius capitis muscles insertion points to the occipital bone reveals marked tenderness that  reproduces the pain which is producing the headache in the back of his head.  Eyes: EOM are normal.  Neck: Normal range of motion. Neck supple.  Cardiovascular: Intact distal pulses.   Initial auscultation reveals a regular heart rhythm. After approximately 1 minute the  rhythm became regular.  Pulmonary/Chest: Effort normal and breath sounds normal. No respiratory distress. He has no wheezes. He has no rales.  Abdominal: Soft. There is no tenderness.  Musculoskeletal: Normal range of motion.  Lymphadenopathy:    He has no cervical adenopathy.  Neurological: He is alert. No cranial nerve deficit. He exhibits normal muscle tone.  Skin: Skin is warm and dry. No erythema.  Psychiatric: He has a normal mood and affect.  Nursing note and vitals reviewed.   ED Course  Procedures (including critical care time) ED ECG REPORT   Date: 02/19/2016  Rate: 73  Rhythm: normal sinus rhythm  QRS Axis: left  Intervals: normal  ST/T Wave abnormalities: nonspecific T wave changes  Conduction Disutrbances:none  Narrative Interpretation: Today's EKG actually has improved over the EKG obtained on 02/03/2016. The ST-T changes apparent on the first EKG have resolved. There were nonspecific ST-T changes including depression with deep T-wave inversions in the inferior leads. These have since resolved. T waves that were inverted in V4 and the 5 and V6 on the first EKG have also resolved to either isoelectric are positive.  Old EKG Reviewed: changes noted  I have personally reviewed the EKG tracing and agree with the computerized printout as noted.  Labs Review Labs Reviewed  POCT URINALYSIS DIP (DEVICE) - Abnormal; Notable for the following:    Hgb urine dipstick MODERATE (*)    Leukocytes, UA MODERATE (*)    All other components within normal limits  URINE CULTURE  POCT I-STAT, CHEM 8   Results for orders placed or performed during the hospital encounter of 02/19/16  I-STAT, chem 8  Result Value Ref  Range   Sodium 139 135 - 145 mmol/L   Potassium  4.0 3.5 - 5.1 mmol/L   Chloride 101 101 - 111 mmol/L   BUN 17 6 - 20 mg/dL   Creatinine, Ser 1.10 0.61 - 1.24 mg/dL   Glucose, Bld 97 65 - 99 mg/dL   Calcium, Ion 1.20 1.12 - 1.23 mmol/L   TCO2 27 0 - 100 mmol/L   Hemoglobin 16.3 13.0 - 17.0 g/dL   HCT 48.0 39.0 - 52.0 %  POCT urinalysis dip (device)  Result Value Ref Range   Glucose, UA NEGATIVE NEGATIVE mg/dL   Bilirubin Urine NEGATIVE NEGATIVE   Ketones, ur NEGATIVE NEGATIVE mg/dL   Specific Gravity, Urine 1.020 1.005 - 1.030   Hgb urine dipstick MODERATE (A) NEGATIVE   pH 7.0 5.0 - 8.0   Protein, ur NEGATIVE NEGATIVE mg/dL   Urobilinogen, UA 1.0 0.0 - 1.0 mg/dL   Nitrite NEGATIVE NEGATIVE   Leukocytes, UA MODERATE (A) NEGATIVE     Imaging Review No results found.   Visual Acuity Review  Right Eye Distance:   Left Eye Distance:   Bilateral Distance:    Right Eye Near:   Left Eye Near:    Bilateral Near:         MDM   1. Tension-type headache, not intractable, unspecified chronicity pattern   2. Weakness   3. Urinary frequency   4. Gastroesophageal reflux disease, esophagitis presence not specified   5. UTI (lower urinary tract infection)    Drink plenty of fluids and stay well-hydrated. Take your medicines as directed. If you are getting worse or develop new symptoms or problems seek medical attention promptly. Home and rest. Meds ordered this encounter  Medications  . cephALEXin (KEFLEX) 500 MG capsule    Sig: Take 1 capsule (500 mg total) by mouth 4 (four) times daily.    Dispense:  28 capsule    Refill:  0    Order Specific Question:  Supervising Provider    Answer:  Melony Overly [id number]   Follow-up your primary care doctor this week in the next one or 2 days.    Janne Napoleon, NP 02/19/16 1941

## 2016-02-21 LAB — URINE CULTURE: SPECIAL REQUESTS: NORMAL

## 2016-02-24 ENCOUNTER — Telehealth (HOSPITAL_COMMUNITY): Payer: Self-pay | Admitting: Emergency Medicine

## 2016-02-24 NOTE — ED Notes (Signed)
Called pt and notified of recent lab results from visit 6/21 Pt ID'd properly... Reports feeling better and sx have subsided Adv pt if sx are not getting better to return  Pt verb understanding  Per Dr. Valere Dross,  Notes Recorded by Sherlene Shams, MD on 02/22/2016 at 3:17 PM Please let patient know that urine culture was positive Proteus, sensitive to cephalexin rx given at [facility] visit 02/19/16. Finish cephalexin. Recheck or followup pcp/Camille Jonni Sanger as needed for persistent symptoms. LM

## 2016-03-30 ENCOUNTER — Encounter (INDEPENDENT_AMBULATORY_CARE_PROVIDER_SITE_OTHER): Payer: Self-pay

## 2016-03-30 ENCOUNTER — Encounter: Payer: Self-pay | Admitting: Internal Medicine

## 2016-03-30 ENCOUNTER — Ambulatory Visit (INDEPENDENT_AMBULATORY_CARE_PROVIDER_SITE_OTHER): Payer: Medicaid Other | Admitting: Internal Medicine

## 2016-03-30 VITALS — BP 118/84 | HR 51 | Ht 71.0 in | Wt 244.2 lb

## 2016-03-30 DIAGNOSIS — I480 Paroxysmal atrial fibrillation: Secondary | ICD-10-CM | POA: Diagnosis not present

## 2016-03-30 DIAGNOSIS — I495 Sick sinus syndrome: Secondary | ICD-10-CM

## 2016-03-30 DIAGNOSIS — R55 Syncope and collapse: Secondary | ICD-10-CM | POA: Diagnosis not present

## 2016-03-30 DIAGNOSIS — Z950 Presence of cardiac pacemaker: Secondary | ICD-10-CM | POA: Diagnosis not present

## 2016-03-30 LAB — CUP PACEART INCLINIC DEVICE CHECK
Battery Impedance: 409 Ohm
Battery Remaining Longevity: 123 mo
Implantable Lead Implant Date: 20091023
Implantable Lead Location: 753859
Implantable Lead Model: 5076
Lead Channel Pacing Threshold Amplitude: 0.5 V
MDC IDC MSMT BATTERY VOLTAGE: 2.79 V
MDC IDC MSMT LEADCHNL RA IMPEDANCE VALUE: 468 Ohm
MDC IDC MSMT LEADCHNL RA PACING THRESHOLD PULSEWIDTH: 0.4 ms
MDC IDC MSMT LEADCHNL RA SENSING INTR AMPL: 2 mV
MDC IDC MSMT LEADCHNL RV IMPEDANCE VALUE: 67 Ohm
MDC IDC SESS DTM: 20170731170356
MDC IDC SET LEADCHNL RA PACING AMPLITUDE: 2 V
MDC IDC STAT BRADY RA PERCENT PACED: 22 %

## 2016-03-30 NOTE — Patient Instructions (Addendum)
Medication Instructions: - Your physician recommends that you continue on your current medications as directed. Please refer to the Current Medication list given to you today.  Labwork: - Your physician recommends that you have lab work today: Magnesium/ TSH  Procedures/Testing: - none  Follow-Up: - You have been referred to: Dr. Rayann Heman- discuss ablation (atrial tach/ a-fib)  - Remote monitoring is used to monitor your Pacemaker of ICD from home. This monitoring reduces the number of office visits required to check your device to one time per year. It allows [name] to keep an eye on the functioning of your device to ensure it is working properly. You are scheduled for a device check from home on 06/29/16. You may send your transmission at any time that day. If you have a wireless device, the transmission will be sent automatically. After your physician reviews your transmission, you will receive a postcard with your next transmission date.  - Your physician wants you to follow-up in: 6 months with Roderic Palau, NP in the [facility] 1 year with Dr. [name]. You will receive a reminder letter in the mail two months in advance. If you don't receive a letter, please call our office to schedule the follow-up appointment.  Any Additional Special Instructions Will Be Listed Below (If Applicable).     If you need a refill on your cardiac medications before your next appointment, please call your pharmacy.

## 2016-03-30 NOTE — Progress Notes (Signed)
Patient Care Team: [name], MD as PCP - General (Family Medicine) Sable Feil, MD (Gastroenterology)   HPI  [name] is a 55 y.o. male Seen in followup for pacemaker implanted for sinus node dysfunction and paroxysmal atrial arrhythmias which persisted despite dofetilide. He underwent catheter ablation of atrial fibrillation 2012. He had been on dofetilide; this was stopped March 2014. He had recurrent atrial fibrillation with a rapid rate. He saw Dr. Greggory Brandy for consideration of repeat ablation 4/16; this was deferred.  He remains on dofetilide  He has now had recurrent frequent problems with tachycardia palpitations associated with presyncope and impending doom.  6/17 K4.0 creatinine 1.10    He has treated hypothyroidism;      He has a history of ischemic heart disease with prior non-STEMI possibly type II as there was  no significant obstructive lesions noted. Catheterization was done in 2006 in 2009  showing nonobstructive disease. A Myoview scan 2011 was nonischemic. He also is a history of pulmonary embolism   Echocardiogram 2012 demonstrated mild LV dysfunction 45-50%     Past Medical History:  Diagnosis Date  . Anginal pain ([place])    freq  goes away quickly  . Atrial fibrillation ([place])    s/p afib ablation  PVI 5/12 JA  . CAD -nonobstructive    S/P nstemi-type II cath 05/2008 - nonobs. dzs.  . Complication of anesthesia   . DDD (degenerative disc disease)    evaluate by neurosurgery is ongoing  . Depression    ptsd  . Erectile dysfunction   . GERD (gastroesophageal reflux disease)   . Headache(784.0)   . Hemorrhoids   . HTN (hypertension)   . Hyperlipemia   . Hyperthyroidism    Graves dz on methimazole and followed by [name]  . Obesity   . Pacemaker-medtronic- AAI    Dual chamber V port plugged   . Pancreatitis   . Polysubstance abuse    cocaine, last used 1999  . PONV (postoperative nausea and vomiting)    2010 after went home  -infection  . PUD (peptic ulcer disease)    gastritis due to ETOH previously  . Pulmonary embolism ([place]) 04/2003   Hx of  . Rectal bleeding   . Shortness of breath    LYING DOWN AT TIMES  . Sinus node dysfunction (HCC)    s/p MDT PPM  . Sleep apnea    uses CPAP [facility] 2 YRS  . Systolic and diastolic CHF, chronic ([place])    EF 35% by most recent echo 09/19/10  . 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 ANTERIOR CERVICAL DECOMP/DISCECTOMY FUSION  12/15/2011   Procedure: ANTERIOR CERVICAL DECOMPRESSION/DISCECTOMY FUSION 1 LEVEL;  Surgeon: [name], MD;  Location: [facility];  Service: Neurosurgery;  Laterality: N/A;  Cervical Six-Seven Anterior Cervical Diskectomy fusion with Allograft and Plating  . ATRIAL ABLATION SURGERY  12/2010   afib ablation by [name]  . BACK SURGERY    . CARDIAC CATHETERIZATION     CADIAC ABLATION [name] 12/2010  . Fingers removed from right hand.  2/84   traumatic work injury  . INSERT / REPLACE / REMOVE PACEMAKER     05/2008    . LUMBAR SPINE SURGERY  02/2009   Dr. Trenton Gammon  . PACEMAKER INSERTION  10/09   by Dr [name]    Current Outpatient Prescriptions  Medication Sig Dispense Refill  . carvedilol (COREG) 25 MG tablet TAKE 1 TABLET (25 MG TOTAL) BY MOUTH 2 (TWO) TIMES DAILY WITH A MEAL. 60 tablet 2  . cyclobenzaprine (FLEXERIL) 10 MG tablet Take 10 mg by mouth 2 (two) times daily. For muscle spams    . dabigatran (PRADAXA) 150 MG CAPS capsule Take 1 capsule (150 mg total) by mouth 2 (two) times daily. 22 capsule 0  . dofetilide (TIKOSYN) 500 MCG capsule Take 1 capsule (500 mcg total) by mouth 2 (two) times daily. 180 capsule 2  . fluticasone (FLONASE) 50 MCG/ACT nasal spray Place 2 sprays into both nostrils daily as needed for allergies. 16 g prn  . gabapentin (NEURONTIN) 100 MG capsule Take 100 mg by mouth every morning.     [name] KLOR-CON M20 20 MEQ tablet TAKE 1 TABLET (20  MEQ TOTAL) BY MOUTH DAILY. 30 tablet 0  . methimazole (TAPAZOLE) 10 MG tablet Take 1 tablet (10 mg total) by mouth daily. 30 tablet 1  . nitroGLYCERIN (NITROSTAT) 0.4 MG SL tablet Place 0.4 mg under the tongue every 5 (five) minutes as needed (MAX 3 TABLETS). For chest pain    . omeprazole (PRILOSEC) 40 MG capsule Take 40 mg by mouth daily.     [name] oxyCODONE-acetaminophen (PERCOCET) 10-325 MG per tablet Take 1 tablet by mouth every 4 (four) hours as needed for pain. 29 tablet 0  . sildenafil (VIAGRA) 100 MG tablet Take 100 mg by mouth daily as needed. For erectile dysfunction    . simvastatin (ZOCOR) 40 MG tablet TAKE 1 TABLET (40 MG TOTAL) BY MOUTH DAILY. 30 tablet 11   No current facility-administered medications for this visit.     Allergies  Allergen Reactions  . Celebrex [Celecoxib] Itching  . Hydrocodone-Acetaminophen Itching and Dermatitis    Can take plain Tylenol  . Prednisolone Itching    Review of Systems negative except from HPI and PMH  Physical Exam BP 118/84   Pulse (!) 51   Ht 5\' 11"  (1.803 m)   Wt 244 lb 3.2 oz (110.8 kg)   SpO2 98%   BMI 34.06 kg/m  Well developed and well nourished in no acute distress HENT normal E scleral and icterus clear Neck Supple JVP flat; carotids brisk and full Clear to ausculation  Regular rate and rhythm, no murmurs gallops or rub Soft with active bowel sounds No clubbing cyanosis none Edema Alert and oriented, grossly normal motor and sensory function Skin Warm and Dry  ECG shows sinus rhythm at 64 Interval 16/11/42 occ  PACs  Event recorder was reviewed today with the patient. Demonstrates recurrent bursts of a tachycardia associated with aberration at its initiation. There is an underlying atrial cycle length seems to be identical to the ventricular rates suggesting an atrial flutter with one-to-one conduction.  Assessment and  Plan  Syncope   Atrial fibrillation /flutter with one-to-one conduction    Sinus node  dysfunction  High Risk Medication Surveillance  Pacemaker-Medtronic -dual-chamber with ventricular port plugged--AAI The patient's device was interrogated and the information was fully reviewed.  The device was reprogrammed to  allow recording of his atrial high rate episodes to confirm atrial fibrillation  Chronic pain  Treated hyperthyroidism  We'll check his TSH on his treated hyperthyroidism. We'll check a magnesium on dofetilide. He is having frequent recurrences of symptomatic tachypalpitations.  Atrial arrhythmia burden is approximately 8.3%   He would like to discuss with Dr. Rayann Heman again the possibility of catheter ablation. We will  arrange follow-up with him. We will have him follow-up with DC in 6 months and I will see him in 12 months.   Last potassium levels were normal. We'll check his magnesium level.

## 2016-03-31 ENCOUNTER — Encounter: Payer: Self-pay | Admitting: Internal Medicine

## 2016-03-31 LAB — MAGNESIUM: Magnesium: 2.1 mg/dL (ref 1.5–2.5)

## 2016-03-31 LAB — TSH: TSH: 0.45 mIU/L (ref 0.40–4.50)

## 2016-04-15 ENCOUNTER — Ambulatory Visit (INDEPENDENT_AMBULATORY_CARE_PROVIDER_SITE_OTHER): Payer: Medicaid Other | Admitting: Internal Medicine

## 2016-04-15 ENCOUNTER — Encounter: Payer: Self-pay | Admitting: Internal Medicine

## 2016-04-15 ENCOUNTER — Encounter (INDEPENDENT_AMBULATORY_CARE_PROVIDER_SITE_OTHER): Payer: Self-pay

## 2016-04-15 VITALS — BP 122/94 | HR 50 | Ht 71.0 in | Wt 242.0 lb

## 2016-04-15 DIAGNOSIS — I495 Sick sinus syndrome: Secondary | ICD-10-CM | POA: Diagnosis not present

## 2016-04-15 DIAGNOSIS — Z7901 Long term (current) use of anticoagulants: Secondary | ICD-10-CM

## 2016-04-15 DIAGNOSIS — I480 Paroxysmal atrial fibrillation: Secondary | ICD-10-CM

## 2016-04-15 DIAGNOSIS — G4733 Obstructive sleep apnea (adult) (pediatric): Secondary | ICD-10-CM

## 2016-04-15 MED ORDER — CARVEDILOL 25 MG PO TABS: 37.5000 mg | ORAL_TABLET | Freq: Two times a day (BID) | ORAL | 11 refills | Status: DC

## 2016-04-15 NOTE — Patient Instructions (Addendum)
Medication Instructions:  Your physician has recommended you make the following change in your medication:  1) Increase Carvedilol to 37.5 mg twice daily   Labwork: Your physician recommends that you return for lab work same day as echo--BMP/CBC   Testing/Procedures: Your physician has requested that you have cardiac CT. Cardiac computed tomography (CT) is a painless test that uses an x-ray machine to take clear, detailed pictures of your heart. For further information please visit [email or web address]. Please follow instruction sheet as given.---office will call to schedule procedure, week of 04/27/16   Your physician has recommended that you have an ablation. Catheter ablation is a medical procedure used to treat some cardiac arrhythmias (irregular heartbeats). During catheter ablation, a long, thin, flexible tube is put into a blood vessel in your groin (upper thigh), or neck. This tube is called an ablation catheter. It is then guided to your heart through the blood vessel. Radio frequency waves destroy small areas of heart tissue where abnormal heartbeats may cause an arrhythmia to start. Please see the instruction sheet given to you today.--05/05/16  Please arrive at [facility] on 05/05/16 at 5:30am Do not eat or drink after midnight the night prior to your procedure Do not take any medications the morning of the procedure     Follow-Up: Your physician recommends that you schedule a follow-up appointment in: 4 weeks for 05/05/16 with Roderic Palau, NP in afib clinic and 3 months from 05/05/16 with [name]   Any Other Special Instructions Will Be Listed Below (If Applicable).     If you need a refill on your cardiac medications before your next appointment, please call your pharmacy.

## 2016-04-16 LAB — CUP PACEART INCLINIC DEVICE CHECK
Battery Voltage: 2.79 V
Brady Statistic RA Percent Paced: 11 %
Date Time Interrogation Session: 20170816152312
Lead Channel Impedance Value: 459 Ohm
Lead Channel Pacing Threshold Pulse Width: 0.4 ms
Lead Channel Setting Pacing Amplitude: 2 V
MDC IDC LEAD IMPLANT DT: 20091023
MDC IDC LEAD LOCATION: 753859
MDC IDC MSMT BATTERY IMPEDANCE: 410 Ohm
MDC IDC MSMT BATTERY REMAINING LONGEVITY: 124 mo
MDC IDC MSMT LEADCHNL RA PACING THRESHOLD AMPLITUDE: 0.5 V
MDC IDC MSMT LEADCHNL RA SENSING INTR AMPL: 2 mV
MDC IDC MSMT LEADCHNL RV IMPEDANCE VALUE: 67 Ohm

## 2016-04-16 NOTE — Progress Notes (Signed)
Electrophysiology Office Note   Date:  04/16/2016   ID:  [name], DOB 1961-05-04, MRN [id number]  PCP:  Ricke Hey, MD   Primary Electrophysiologist: Dr [name]   Chief Complaint  Patient presents with  . Atrial Fibrillation     History of Present Illness: [name] is a 55 y.o. male who presents today for electrophysiology evaluation.   The patient is s/p afib ablation by me in 2012.  He has done well but continues to have atrial arrhythmias.  He has atrial flutter/ atach with very fast ventricular rates and symptoms of presyncope.  He feels that episodes have worsened.  He is on tikosyn without relief.  Today, he denies symptoms of chest pain, shortness of breath, orthopnea, PND, lower extremity edema, claudication, bleeding, or neurologic sequela. The patient is tolerating medications without difficulties and is otherwise without complaint today.    Past Medical History:  Diagnosis Date  . Anginal pain ([place])    freq  goes away quickly  . Atrial fibrillation ([place])    s/p afib ablation  PVI 5/12 JA  . CAD -nonobstructive    S/P nstemi-type II cath 05/2008 - nonobs. dzs.  . Complication of anesthesia   . DDD (degenerative disc disease)    evaluate by neurosurgery is ongoing  . Depression    ptsd  . Erectile dysfunction   . GERD (gastroesophageal reflux disease)   . Headache(784.0)   . Hemorrhoids   . HTN (hypertension)   . Hyperlipemia   . Hyperthyroidism    Graves dz on methimazole and followed by [name]  . Obesity   . Pacemaker-medtronic- AAI    Dual chamber V port plugged   . Pancreatitis   . Polysubstance abuse    cocaine, last used 1999  . PONV (postoperative nausea and vomiting)    2010 after went home -infection  . PUD (peptic ulcer disease)    gastritis due to ETOH previously  . Pulmonary embolism ([place]) 04/2003   Hx of  . Rectal bleeding   . Shortness of breath    LYING DOWN AT TIMES  . Sinus node dysfunction ([facility])    s/p MDT PPM  . Sleep apnea    uses CPAP [facility] 2 YRS  . Systolic and diastolic CHF, chronic ([place])    EF 35% by most recent echo 09/19/10  . 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 ANTERIOR CERVICAL DECOMP/DISCECTOMY FUSION  12/15/2011   Procedure: ANTERIOR CERVICAL DECOMPRESSION/DISCECTOMY FUSION 1 LEVEL;  Surgeon: [name], MD;  Location: [facility];  Service: Neurosurgery;  Laterality: N/A;  Cervical Six-Seven Anterior Cervical Diskectomy fusion with Allograft and Plating  . ATRIAL ABLATION SURGERY  12/2010   afib ablation by [name]  . BACK SURGERY    . CARDIAC CATHETERIZATION     CADIAC ABLATION [name] 12/2010  . Fingers removed from right hand.  2/84   traumatic work injury  . INSERT / REPLACE / REMOVE PACEMAKER     05/2008    . LUMBAR SPINE SURGERY  02/2009   Dr. Trenton Gammon  . PACEMAKER INSERTION  10/09   by Dr [name]     Current Outpatient Prescriptions  Medication Sig Dispense Refill  . carvedilol (COREG) 25 MG tablet Take 1.5 tablets (37.5 mg total) by mouth 2 (two) times daily with a meal. 90 tablet 11  . cyclobenzaprine (FLEXERIL) 10  MG tablet Take 10 mg by mouth 2 (two) times daily. For muscle spams    . dabigatran (PRADAXA) 150 MG CAPS capsule Take 1 capsule (150 mg total) by mouth 2 (two) times daily. 22 capsule 0  . dofetilide (TIKOSYN) 500 MCG capsule Take 1 capsule (500 mcg total) by mouth 2 (two) times daily. 180 capsule 2  . fluticasone (FLONASE) 50 MCG/ACT nasal spray Place 2 sprays into both nostrils daily as needed for allergies. 16 g prn  . gabapentin (NEURONTIN) 100 MG capsule Take 100 mg by mouth every morning.     [name] KLOR-CON M20 20 MEQ tablet TAKE 1 TABLET (20 MEQ TOTAL) BY MOUTH DAILY. 30 tablet 0  . methimazole (TAPAZOLE) 10 MG tablet Take 1 tablet (10 mg total) by mouth daily. 30 tablet 1  . omeprazole (PRILOSEC) 40 MG capsule Take 40 mg by mouth daily.     [name]  oxyCODONE-acetaminophen (PERCOCET) 10-325 MG per tablet Take 1 tablet by mouth every 4 (four) hours as needed for pain. 29 tablet 0  . sildenafil (VIAGRA) 100 MG tablet Take 100 mg by mouth daily as needed. For erectile dysfunction    . simvastatin (ZOCOR) 40 MG tablet TAKE 1 TABLET (40 MG TOTAL) BY MOUTH DAILY. 30 tablet 11   No current facility-administered medications for this visit.     Allergies:   Celebrex [celecoxib]; Hydrocodone-acetaminophen; and Prednisolone   Social History:  The patient  reports that he quit smoking about 13 years ago. His smoking use included Cigarettes. He has a 15.00 pack-year smoking history. He has never used smokeless tobacco. He reports that he does not drink alcohol or use drugs.   Family History:  The patient's family history includes Breast cancer in his mother; Heart disease in his mother; Lung cancer in his father.    ROS:  Please see the history of present illness.   All other systems are reviewed and negative.    PHYSICAL EXAM: VS:  BP (!) 122/94   Pulse (!) 50   Ht 5\' 11"  (1.803 m)   Wt 242 lb (109.8 kg)   BMI 33.75 kg/m  , BMI Body mass index is 33.75 kg/m. GEN: Well nourished, well developed, in no acute distress  HEENT: normal  Neck: no JVD, carotid bruits, or masses Cardiac: RRR; no murmurs, rubs, or gallops,no edema  Respiratory:  clear to auscultation bilaterally, normal work of breathing GI: soft, nontender, nondistended, + BS MS: no deformity or atrophy  Skin: warm and dry, device pocket is well healed Neuro:  Strength and sensation are intact Psych: euthymic mood, full affect  EKG:  EKG is ordered today. The ekg ordered today shows atrial pacing, nonspecific St/T changes,   Device interrogation is reviewed today in detail.  See PaceArt for details.   Recent Labs: 02/03/2016: ALT 20; Platelets 237 02/19/2016: BUN 17; Creatinine, Ser 1.10; Hemoglobin 16.3; Potassium 4.0; Sodium 139 03/30/2016: Magnesium 2.1; TSH 0.45     Lipid Panel     Component Value Date/Time   CHOL 163 02/03/2016 1026   TRIG 68 02/03/2016 1026   HDL 47 02/03/2016 1026   CHOLHDL 3.5 02/03/2016 1026   VLDL 14 02/03/2016 1026   LDLCALC 102 02/03/2016 1026   LDLDIRECT 163.8 02/08/2008 1003     Wt Readings from Last 3 Encounters:  04/15/16 242 lb (109.8 kg)  03/30/16 244 lb 3.2 oz (110.8 kg)  02/03/16 278 lb (126.1 kg)      Other studies Reviewed: Additional studies/ records that  were reviewed today include: Dr Aquilla Hacker notes    ASSESSMENT AND PLAN:  1.  Atrial arrhythmias S/p prior afib ablation He also has atrial flutter/ atrial tachycardia with fast ventricular rates. He has failed medical therapy with tikosyn and coreg.  Therapeutic strategies for atrial arrhythmias including medicine and ablation were discussed in detail with the patient today. Risk, benefits, and alternatives to EP study and radiofrequency ablation for afib were also discussed in detail today. These risks include but are not limited to stroke, bleeding, vascular damage, tamponade, perforation, damage to the esophagus, lungs, and other structures, pulmonary vein stenosis, worsening renal function, and death. The patient understands these risk and wishes to proceed at this time.  He reports compliance with anticoagulation.  I have increased Coreg to 37.5mg  BID today for better rate control.  Will obtain cardiac CT prior to ablation to evaluate his LA anatomy and also to exclude LAA thrombus.  2. Sick sinus Normal pacemaker function See Pace Art report No changes today  3. OSA Compliance with CPAP is encouarged  4. Nonischemic CM Repeat echo at this time   Current medicines are reviewed at length with the patient today.   The patient does not have concerns regarding his medicines.  The following changes were made today:  none    Signed, Thompson Grayer, MD  04/16/2016 11:06 PM     [address]  [place] [address] [phone] (office) [phone] (fax)

## 2016-04-17 ENCOUNTER — Other Ambulatory Visit: Payer: Self-pay | Admitting: Internal Medicine

## 2016-04-23 ENCOUNTER — Encounter: Payer: Self-pay | Admitting: Internal Medicine

## 2016-04-24 ENCOUNTER — Other Ambulatory Visit: Payer: Self-pay

## 2016-04-24 ENCOUNTER — Other Ambulatory Visit: Payer: Medicaid Other | Admitting: *Deleted

## 2016-04-24 ENCOUNTER — Ambulatory Visit (HOSPITAL_COMMUNITY): Payer: Medicaid Other | Attending: Cardiology

## 2016-04-24 DIAGNOSIS — I480 Paroxysmal atrial fibrillation: Secondary | ICD-10-CM | POA: Diagnosis not present

## 2016-04-24 DIAGNOSIS — I358 Other nonrheumatic aortic valve disorders: Secondary | ICD-10-CM | POA: Insufficient documentation

## 2016-04-24 DIAGNOSIS — I110 Hypertensive heart disease with heart failure: Secondary | ICD-10-CM | POA: Diagnosis not present

## 2016-04-24 DIAGNOSIS — I4891 Unspecified atrial fibrillation: Secondary | ICD-10-CM | POA: Diagnosis present

## 2016-04-24 DIAGNOSIS — I2510 Atherosclerotic heart disease of native coronary artery without angina pectoris: Secondary | ICD-10-CM | POA: Insufficient documentation

## 2016-04-24 DIAGNOSIS — I5040 Unspecified combined systolic (congestive) and diastolic (congestive) heart failure: Secondary | ICD-10-CM | POA: Diagnosis not present

## 2016-04-24 DIAGNOSIS — I77810 Thoracic aortic ectasia: Secondary | ICD-10-CM | POA: Insufficient documentation

## 2016-04-24 LAB — CBC WITH DIFFERENTIAL/PLATELET
BASOS ABS: 0 {cells}/uL (ref 0–200)
Basophils Relative: 0 %
EOS PCT: 1 %
Eosinophils Absolute: 77 cells/uL (ref 15–500)
HCT: 41.8 % (ref 38.5–50.0)
HEMOGLOBIN: 13.8 g/dL (ref 13.2–17.1)
LYMPHS ABS: 2926 {cells}/uL (ref 850–3900)
Lymphocytes Relative: 38 %
MCH: 26.8 pg — AB (ref 27.0–33.0)
MCHC: 33 g/dL (ref 32.0–36.0)
MCV: 81.3 fL (ref 80.0–100.0)
MONOS PCT: 7 %
MPV: 10.1 fL (ref 7.5–12.5)
Monocytes Absolute: 539 cells/uL (ref 200–950)
NEUTROS ABS: 4158 {cells}/uL (ref 1500–7800)
NEUTROS PCT: 54 %
PLATELETS: 222 10*3/uL (ref 140–400)
RBC: 5.14 MIL/uL (ref 4.20–5.80)
RDW: 17.5 % — AB (ref 11.0–15.0)
WBC: 7.7 10*3/uL (ref 3.8–10.8)

## 2016-04-24 LAB — BASIC METABOLIC PANEL
BUN: 17 mg/dL (ref 7–25)
CALCIUM: 9.1 mg/dL (ref 8.6–10.3)
CO2: 23 mmol/L (ref 20–31)
CREATININE: 1.15 mg/dL (ref 0.70–1.33)
Chloride: 105 mmol/L (ref 98–110)
GLUCOSE: 89 mg/dL (ref 65–99)
Potassium: 4.2 mmol/L (ref 3.5–5.3)
SODIUM: 139 mmol/L (ref 135–146)

## 2016-04-27 ENCOUNTER — Encounter (HOSPITAL_COMMUNITY): Payer: Self-pay

## 2016-04-27 ENCOUNTER — Ambulatory Visit (HOSPITAL_COMMUNITY)
Admission: RE | Admit: 2016-04-27 | Discharge: 2016-04-27 | Disposition: A | Discharge status: Home / Self Care | Payer: Medicaid Other | Source: Ambulatory Visit | Attending: Internal Medicine | Admitting: Internal Medicine

## 2016-04-27 DIAGNOSIS — I480 Paroxysmal atrial fibrillation: Secondary | ICD-10-CM | POA: Diagnosis not present

## 2016-04-27 MED ORDER — NITROGLYCERIN 0.4 MG SL SUBL
SUBLINGUAL_TABLET | SUBLINGUAL | Status: AC
  Filled 2016-04-27: qty 1

## 2016-04-27 MED ORDER — IOPAMIDOL (ISOVUE-370) INJECTION 76%
INTRAVENOUS | Status: AC
  Administered 2016-04-27: 80 mL
  Filled 2016-04-27: qty 100

## 2016-04-27 MED ORDER — NITROGLYCERIN 0.4 MG SL SUBL
0.4000 mg | SUBLINGUAL_TABLET | SUBLINGUAL | Status: DC | PRN
  Administered 2016-04-27: 0.4 mg via SUBLINGUAL

## 2016-05-01 ENCOUNTER — Telehealth: Payer: Self-pay | Admitting: Nurse Practitioner

## 2016-05-01 NOTE — Telephone Encounter (Signed)
Pt's CT scan reviewed - possible significant CAD in LCx.  Discussed with patient, he has shortness of breath and pre-syncope with tachycardia but no chest pain.  Per [name] - will cancel ablation for Tuesday and see in office Wednesday to discuss further evaluation of CAD prior to ablation.  Pt aware and agrees with plan  Chanetta Marshall, NP 05/01/2016 1:32 PM

## 2016-05-02 ENCOUNTER — Encounter: Payer: Self-pay | Admitting: Internal Medicine

## 2016-05-05 ENCOUNTER — Encounter: Payer: Self-pay | Admitting: Internal Medicine

## 2016-05-05 ENCOUNTER — Ambulatory Visit (HOSPITAL_COMMUNITY): Admission: RE | Admit: 2016-05-05 | Payer: Medicaid Other | Source: Ambulatory Visit | Admitting: Internal Medicine

## 2016-05-05 ENCOUNTER — Encounter (HOSPITAL_COMMUNITY): Admission: RE | Payer: Self-pay | Source: Ambulatory Visit

## 2016-05-05 SURGERY — ATRIAL FIBRILLATION ABLATION
Anesthesia: Monitor Anesthesia Care

## 2016-05-06 ENCOUNTER — Ambulatory Visit (INDEPENDENT_AMBULATORY_CARE_PROVIDER_SITE_OTHER): Payer: Medicaid Other | Admitting: Internal Medicine

## 2016-05-06 ENCOUNTER — Encounter: Payer: Self-pay | Admitting: Internal Medicine

## 2016-05-06 ENCOUNTER — Ambulatory Visit: Payer: Medicaid Other | Admitting: Internal Medicine

## 2016-05-06 ENCOUNTER — Encounter (INDEPENDENT_AMBULATORY_CARE_PROVIDER_SITE_OTHER): Payer: Self-pay

## 2016-05-06 VITALS — BP 138/70 | HR 62 | Ht 71.0 in | Wt 246.4 lb

## 2016-05-06 DIAGNOSIS — I495 Sick sinus syndrome: Secondary | ICD-10-CM

## 2016-05-06 DIAGNOSIS — R079 Chest pain, unspecified: Secondary | ICD-10-CM

## 2016-05-06 DIAGNOSIS — R938 Abnormal findings on diagnostic imaging of other specified body structures: Secondary | ICD-10-CM

## 2016-05-06 DIAGNOSIS — I255 Ischemic cardiomyopathy: Secondary | ICD-10-CM

## 2016-05-06 DIAGNOSIS — I480 Paroxysmal atrial fibrillation: Secondary | ICD-10-CM

## 2016-05-06 DIAGNOSIS — R931 Abnormal findings on diagnostic imaging of heart and coronary circulation: Secondary | ICD-10-CM

## 2016-05-06 DIAGNOSIS — Z950 Presence of cardiac pacemaker: Secondary | ICD-10-CM

## 2016-05-06 LAB — PROTIME-INR
INR: 1
PROTHROMBIN TIME: 10.9 s (ref 9.0–11.5)

## 2016-05-06 LAB — CBC WITH DIFFERENTIAL/PLATELET
BASOS PCT: 0 %
Basophils Absolute: 0 cells/uL (ref 0–200)
EOS PCT: 4 %
Eosinophils Absolute: 200 cells/uL (ref 15–500)
HCT: 42.6 % (ref 38.5–50.0)
Hemoglobin: 14.2 g/dL (ref 13.2–17.1)
Lymphocytes Relative: 48 %
Lymphs Abs: 2400 cells/uL (ref 850–3900)
MCH: 27 pg (ref 27.0–33.0)
MCHC: 33.3 g/dL (ref 32.0–36.0)
MCV: 81.1 fL (ref 80.0–100.0)
MONOS PCT: 10 %
MPV: 10.5 fL (ref 7.5–12.5)
Monocytes Absolute: 500 cells/uL (ref 200–950)
NEUTROS ABS: 1900 {cells}/uL (ref 1500–7800)
Neutrophils Relative %: 38 %
PLATELETS: 210 10*3/uL (ref 140–400)
RBC: 5.25 MIL/uL (ref 4.20–5.80)
RDW: 17.2 % — AB (ref 11.0–15.0)
WBC: 5 10*3/uL (ref 3.8–10.8)

## 2016-05-06 LAB — BASIC METABOLIC PANEL
BUN: 15 mg/dL (ref 7–25)
CHLORIDE: 107 mmol/L (ref 98–110)
CO2: 26 mmol/L (ref 20–31)
Calcium: 9.4 mg/dL (ref 8.6–10.3)
Creat: 0.96 mg/dL (ref 0.70–1.33)
GLUCOSE: 92 mg/dL (ref 65–99)
Potassium: 4.4 mmol/L (ref 3.5–5.3)
SODIUM: 143 mmol/L (ref 135–146)

## 2016-05-06 MED ORDER — CARVEDILOL 25 MG PO TABS: 25.0000 mg | ORAL_TABLET | Freq: Two times a day (BID) | ORAL | 11 refills | Status: DC

## 2016-05-06 NOTE — Patient Instructions (Addendum)
Medication Instructions:  Your physician recommends that you continue on your current medications as directed. Please refer to the Current Medication list given to you today.   Labwork: Your physician recommends that you return for lab work today:BMP/CBC/INR   Testing/Procedures: Your physician has requested that you have a cardiac catheterization. Cardiac catheterization is used to diagnose and/or treat various heart conditions. Doctors may recommend this procedure for a number of different reasons. The most common reason is to evaluate chest pain. Chest pain can be a symptom of coronary artery disease (CAD), and cardiac catheterization can show whether plaque is narrowing or blocking your heart's arteries. This procedure is also used to evaluate the valves, as well as measure the blood flow and oxygen levels in different parts of your heart. For further information please visit [email or web address]. Please follow instruction sheet, as given.   Please arrive at [facility] on 05/08/16 at 8:30am Do not eat or drink after midnight the night before your procedure Hold Pradaxa until after procedure Okay to take other morning medications with a small sip of water Plan for one night stay   Follow-Up: Your physician recommends that you schedule a follow-up appointment in: 7-10 days with [name]   Any Other Special Instructions Will Be Listed Below (If Applicable).     If you need a refill on your cardiac medications before your next appointment, please call your pharmacy.

## 2016-05-06 NOTE — Progress Notes (Signed)
Electrophysiology Office Note   Date:  05/06/2016   ID:  [name], DOB 26-Nov-1960, MRN [id number]  PCP:  [name] Hey, MD   Primary Electrophysiologist: Dr [name]   Chief Complaint  Patient presents with  . Atrial Fibrillation     History of Present Illness: [name] is a 55 y.o. male who presents today for electrophysiology evaluation.   The patient is s/p afib ablation by me in 2012.  He has done well but continues to have atrial arrhythmias.  He has atrial flutter/ atach with very fast ventricular rates and symptoms of presyncope.  He feels that episodes have worsened.  He is on tikosyn without relief. He was planned for catheter ablation earlier this week.  Preprocedure chest CT revealed concerns for high grade stenosis in the mid circ.  The patient reports chest tightness frequently.  This was quite prominent this past weekend.   He has frequent tachypalpitations with associated SOB and dizziness also.  Based on symptoms and CT findings, I elected to cancel ablation and have him see me today in the office to consider cath.  Today, he denies symptoms of orthopnea, PND, lower extremity edema, claudication, bleeding, or neurologic sequela. The patient is tolerating medications without difficulties and is otherwise without complaint today.    Past Medical History:  Diagnosis Date  . Anginal pain ([place])    freq  goes away quickly  . Atrial fibrillation ([name])    s/p afib ablation  PVI 5/12 JA  . CAD -nonobstructive    S/P nstemi-type II cath 05/2008 - nonobs. dzs.  . Complication of anesthesia   . DDD (degenerative disc disease)    evaluate by neurosurgery is ongoing  . Depression    ptsd  . Erectile dysfunction   . GERD (gastroesophageal reflux disease)   . Headache(784.0)   . Hemorrhoids   . HTN (hypertension)   . Hyperlipemia   . Hyperthyroidism    Graves dz on methimazole and followed by [name]  . Obesity   . Pacemaker-medtronic- AAI    Dual chamber V port plugged   . Pancreatitis   . Polysubstance abuse    cocaine, last used 1999  . PONV (postoperative nausea and vomiting)    2010 after went home -infection  . PUD (peptic ulcer disease)    gastritis due to ETOH previously  . Pulmonary embolism ([place]) 04/2003   Hx of  . Rectal bleeding   . Shortness of breath    LYING DOWN AT TIMES  . Sinus node dysfunction ([facility])    s/p MDT PPM  . Sleep apnea    uses CPAP [facility] 2 YRS  . Systolic and diastolic CHF, chronic ([place])    EF 35% by most recent echo 09/19/10  . 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 ANTERIOR CERVICAL DECOMP/DISCECTOMY FUSION  12/15/2011   Procedure: ANTERIOR CERVICAL DECOMPRESSION/DISCECTOMY FUSION 1 LEVEL;  Surgeon: [name] Pitter, MD;  Location: [facility];  Service: Neurosurgery;  Laterality: N/A;  Cervical Six-Seven Anterior Cervical Diskectomy fusion with Allograft and Plating  . ATRIAL ABLATION SURGERY  12/2010   afib ablation by Dr [name]  . BACK SURGERY    . CARDIAC CATHETERIZATION     CADIAC ABLATION DR [name] 12/2010  . Fingers removed from right hand.  2/84   traumatic work injury  . INSERT / REPLACE / REMOVE PACEMAKER  05/2008    . LUMBAR SPINE SURGERY  02/2009   Dr. Trenton [name]  . PACEMAKER INSERTION  10/09   by Dr [name]     Current Outpatient Prescriptions  Medication Sig Dispense Refill  . carvedilol (COREG) 25 MG tablet Take 1.5 tablets (37.5 mg total) by mouth 2 (two) times daily with a meal. 90 tablet 11  . dabigatran (PRADAXA) 150 MG CAPS capsule Take 1 capsule (150 mg total) by mouth 2 (two) times daily. 22 capsule 0  . dofetilide (TIKOSYN) 500 MCG capsule Take 1 capsule (500 mcg total) by mouth 2 (two) times daily. 180 capsule 2  . fluticasone (FLONASE) 50 MCG/ACT nasal spray Place 2 sprays into both nostrils daily as needed for allergies. 16 g prn  . gabapentin (NEURONTIN) 100 MG capsule Take 100 mg by  mouth 2 (two) times daily.     [name] ibuprofen (ADVIL,MOTRIN) 200 MG tablet Take 600 mg by mouth every 6 (six) hours as needed for headache or moderate pain.    [name] KLOR-CON M20 20 MEQ tablet TAKE 1 TABLET (20 MEQ TOTAL) BY MOUTH DAILY. 30 tablet 0  . methimazole (TAPAZOLE) 10 MG tablet Take 1 tablet (10 mg total) by mouth daily. 30 tablet 1  . omeprazole (PRILOSEC) 40 MG capsule Take 40 mg by mouth daily.     [name] oxyCODONE-acetaminophen (PERCOCET) 10-325 MG per tablet Take 1 tablet by mouth every 4 (four) hours as needed for pain. 29 tablet 0  . prednisoLONE acetate (PRED FORTE) 1 % ophthalmic suspension Place 1 drop into the left eye 4 (four) times daily.  0  . simvastatin (ZOCOR) 40 MG tablet TAKE 1 TABLET (40 MG TOTAL) BY MOUTH DAILY. 30 tablet 11   No current facility-administered medications for this visit.     Allergies:   Celebrex [celecoxib]; Hydrocodone-acetaminophen; and Prednisolone   Social History:  The patient  reports that he quit smoking about 13 years ago. His smoking use included Cigarettes. He has a 15.00 pack-year smoking history. He has never used smokeless tobacco. He reports that he does not drink alcohol or use drugs.   Family History:  The patient's family history includes Breast cancer in his mother; Heart disease in his mother; Lung cancer in his father.    ROS:  Please see the history of present illness.   All other systems are reviewed and negative.    PHYSICAL EXAM: VS:  BP 138/70   Pulse 62   Ht 5\' 11"  (1.803 m)   Wt 246 lb 6.4 oz (111.8 kg)   BMI 34.37 kg/m  , BMI Body mass index is 34.37 kg/m. GEN: Well nourished, well developed, in no acute distress  HEENT: normal  Neck: no JVD, carotid bruits, or masses Cardiac: RRR; no murmurs, rubs, or gallops,no edema  Respiratory:  clear to auscultation bilaterally, normal work of breathing GI: soft, nontender, nondistended, + BS MS: no deformity or atrophy  Skin: warm and dry, device pocket is well healed Neuro:   Strength and sensation are intact Psych: euthymic mood, full affect  Device remote from 05/02/16 is reviewed and reveals afib burden of 14%   Recent Labs: 02/03/2016: ALT 20 03/30/2016: Magnesium 2.1; TSH 0.45 04/24/2016: BUN 17; Creat 1.15; Hemoglobin 13.8; Platelets 222; Potassium 4.2; Sodium 139    Lipid Panel     Component Value Date/Time   CHOL 163 02/03/2016 1026   TRIG 68 02/03/2016 1026   HDL 47 02/03/2016 1026   CHOLHDL 3.5 02/03/2016 1026   VLDL  14 02/03/2016 1026   LDLCALC 102 02/03/2016 1026   LDLDIRECT 163.8 02/08/2008 1003     Wt Readings from Last 3 Encounters:  05/06/16 246 lb 6.4 oz (111.8 kg)  04/15/16 242 lb (109.8 kg)  03/30/16 244 lb 3.2 oz (110.8 kg)     ASSESSMENT AND PLAN:  1.  Atrial arrhythmias S/p prior afib ablation afib burden by remote reveals 14% afib burden I suspect that he will require repeat afib ablation Given abnormal cardiac CT, I have advised cath prior to consideration of ablation (see below).  2. Chest pain/ SOB Recent CT is worrisome for ischemia I would therefore recommend left heart catheterization with possible PCI.  Discussed the cath with the patient. The patient understands that risks included but are not limited to stroke (1 in 1000), death (1 in 69), kidney failure [usually temporary] (1 in 500), bleeding (1 in 200), allergic reaction [possibly serious] (1 in 200). The patient understands and agrees to proceed.  I have spoken with Dr [place].  Will hold pradaxa tonight and tomorrow and proceed with cath on Friday.  3. ? Mild pulmonary vein stenosis Noted on CT Asymptomatic Will evaluate further with TEE prior to any further ablations  4. Sick sinus Normal pacemaker function by remote from this weekend (reviewed today) See Pace Art report No changes today  5. OSA Compliance with CPAP is encouarged  6. Nonischemic CM Echo reviewed, EF 40-45%   Current medicines are reviewed at length with the patient today.   The  patient does not have concerns regarding his medicines.  The following changes were made today:  none    Signed, Thompson Grayer, MD  05/06/2016 8:12 AM     [facility] [address] [place] [address] [phone] (office) [phone] (fax)

## 2016-05-08 ENCOUNTER — Encounter (HOSPITAL_COMMUNITY): Payer: Self-pay | Admitting: *Deleted

## 2016-05-08 ENCOUNTER — Ambulatory Visit (HOSPITAL_COMMUNITY)
Admission: RE | Admit: 2016-05-08 | Discharge: 2016-05-08 | Disposition: A | Discharge status: Home / Self Care | Payer: Medicaid Other | Source: Ambulatory Visit | Attending: Cardiology | Admitting: Cardiology

## 2016-05-08 ENCOUNTER — Telehealth: Payer: Self-pay | Admitting: Internal Medicine

## 2016-05-08 ENCOUNTER — Encounter (HOSPITAL_COMMUNITY)
Admission: RE | Disposition: A | Discharge status: Home / Self Care | Payer: Self-pay | Source: Ambulatory Visit | Attending: Cardiology

## 2016-05-08 DIAGNOSIS — Z950 Presence of cardiac pacemaker: Secondary | ICD-10-CM | POA: Insufficient documentation

## 2016-05-08 DIAGNOSIS — Z8249 Family history of ischemic heart disease and other diseases of the circulatory system: Secondary | ICD-10-CM | POA: Insufficient documentation

## 2016-05-08 DIAGNOSIS — E669 Obesity, unspecified: Secondary | ICD-10-CM | POA: Insufficient documentation

## 2016-05-08 DIAGNOSIS — Z803 Family history of malignant neoplasm of breast: Secondary | ICD-10-CM | POA: Diagnosis not present

## 2016-05-08 DIAGNOSIS — Z6834 Body mass index (BMI) 34.0-34.9, adult: Secondary | ICD-10-CM | POA: Insufficient documentation

## 2016-05-08 DIAGNOSIS — G4733 Obstructive sleep apnea (adult) (pediatric): Secondary | ICD-10-CM | POA: Insufficient documentation

## 2016-05-08 DIAGNOSIS — K859 Acute pancreatitis without necrosis or infection, unspecified: Secondary | ICD-10-CM | POA: Insufficient documentation

## 2016-05-08 DIAGNOSIS — I428 Other cardiomyopathies: Secondary | ICD-10-CM | POA: Insufficient documentation

## 2016-05-08 DIAGNOSIS — E0590 Thyrotoxicosis, unspecified without thyrotoxic crisis or storm: Secondary | ICD-10-CM | POA: Insufficient documentation

## 2016-05-08 DIAGNOSIS — Z87891 Personal history of nicotine dependence: Secondary | ICD-10-CM | POA: Diagnosis not present

## 2016-05-08 DIAGNOSIS — I4892 Unspecified atrial flutter: Secondary | ICD-10-CM | POA: Diagnosis not present

## 2016-05-08 DIAGNOSIS — Z8719 Personal history of other diseases of the digestive system: Secondary | ICD-10-CM | POA: Insufficient documentation

## 2016-05-08 DIAGNOSIS — I480 Paroxysmal atrial fibrillation: Secondary | ICD-10-CM | POA: Diagnosis present

## 2016-05-08 DIAGNOSIS — I252 Old myocardial infarction: Secondary | ICD-10-CM | POA: Diagnosis not present

## 2016-05-08 DIAGNOSIS — I110 Hypertensive heart disease with heart failure: Secondary | ICD-10-CM | POA: Insufficient documentation

## 2016-05-08 DIAGNOSIS — F4310 Post-traumatic stress disorder, unspecified: Secondary | ICD-10-CM | POA: Insufficient documentation

## 2016-05-08 DIAGNOSIS — Z7901 Long term (current) use of anticoagulants: Secondary | ICD-10-CM | POA: Diagnosis not present

## 2016-05-08 DIAGNOSIS — Z86711 Personal history of pulmonary embolism: Secondary | ICD-10-CM | POA: Insufficient documentation

## 2016-05-08 DIAGNOSIS — R931 Abnormal findings on diagnostic imaging of heart and coronary circulation: Secondary | ICD-10-CM | POA: Diagnosis present

## 2016-05-08 DIAGNOSIS — K219 Gastro-esophageal reflux disease without esophagitis: Secondary | ICD-10-CM | POA: Insufficient documentation

## 2016-05-08 DIAGNOSIS — I4891 Unspecified atrial fibrillation: Secondary | ICD-10-CM | POA: Diagnosis not present

## 2016-05-08 DIAGNOSIS — I288 Other diseases of pulmonary vessels: Secondary | ICD-10-CM | POA: Insufficient documentation

## 2016-05-08 DIAGNOSIS — Z801 Family history of malignant neoplasm of trachea, bronchus and lung: Secondary | ICD-10-CM | POA: Insufficient documentation

## 2016-05-08 DIAGNOSIS — F329 Major depressive disorder, single episode, unspecified: Secondary | ICD-10-CM | POA: Diagnosis not present

## 2016-05-08 DIAGNOSIS — I5042 Chronic combined systolic (congestive) and diastolic (congestive) heart failure: Secondary | ICD-10-CM | POA: Diagnosis not present

## 2016-05-08 DIAGNOSIS — I2510 Atherosclerotic heart disease of native coronary artery without angina pectoris: Secondary | ICD-10-CM | POA: Diagnosis not present

## 2016-05-08 DIAGNOSIS — E785 Hyperlipidemia, unspecified: Secondary | ICD-10-CM | POA: Diagnosis not present

## 2016-05-08 HISTORY — PX: CARDIAC CATHETERIZATION: SHX172

## 2016-05-08 SURGERY — LEFT HEART CATH AND CORONARY ANGIOGRAPHY
Anesthesia: LOCAL

## 2016-05-08 MED ORDER — SODIUM CHLORIDE 0.9 % WEIGHT BASED INFUSION: 1.0000 mL/kg/h | INTRAVENOUS | Status: DC

## 2016-05-08 MED ORDER — HEPARIN SODIUM (PORCINE) 1000 UNIT/ML IJ SOLN
INTRAMUSCULAR | Status: DC | PRN
  Administered 2016-05-08: 5500 [IU] via INTRAVENOUS

## 2016-05-08 MED ORDER — SODIUM CHLORIDE 0.9 % IV SOLN: 250.0000 mL | INTRAVENOUS | Status: DC | PRN

## 2016-05-08 MED ORDER — VERAPAMIL HCL 2.5 MG/ML IV SOLN
INTRAVENOUS | Status: AC
  Filled 2016-05-08: qty 2

## 2016-05-08 MED ORDER — MIDAZOLAM HCL 2 MG/2ML IJ SOLN
INTRAMUSCULAR | Status: DC | PRN
  Administered 2016-05-08: 2 mg via INTRAVENOUS

## 2016-05-08 MED ORDER — MIDAZOLAM HCL 2 MG/2ML IJ SOLN
INTRAMUSCULAR | Status: AC
  Filled 2016-05-08: qty 2

## 2016-05-08 MED ORDER — FENTANYL CITRATE (PF) 100 MCG/2ML IJ SOLN
INTRAMUSCULAR | Status: AC
  Filled 2016-05-08: qty 2

## 2016-05-08 MED ORDER — FENTANYL CITRATE (PF) 100 MCG/2ML IJ SOLN
INTRAMUSCULAR | Status: DC | PRN
  Administered 2016-05-08: 25 ug via INTRAVENOUS

## 2016-05-08 MED ORDER — SODIUM CHLORIDE 0.9 % WEIGHT BASED INFUSION: 3.0000 mL/kg/h | INTRAVENOUS | Status: AC

## 2016-05-08 MED ORDER — SODIUM CHLORIDE 0.9% FLUSH: 3.0000 mL | INTRAVENOUS | Status: DC | PRN

## 2016-05-08 MED ORDER — SODIUM CHLORIDE 0.9% FLUSH: 3.0000 mL | Freq: Two times a day (BID) | INTRAVENOUS | Status: DC

## 2016-05-08 MED ORDER — LIDOCAINE HCL (PF) 1 % IJ SOLN
INTRAMUSCULAR | Status: DC | PRN
  Administered 2016-05-08: 2 mL

## 2016-05-08 MED ORDER — IOPAMIDOL (ISOVUE-370) INJECTION 76%
INTRAVENOUS | Status: DC | PRN
  Administered 2016-05-08: 80 mL via INTRA_ARTERIAL

## 2016-05-08 MED ORDER — LIDOCAINE HCL (PF) 1 % IJ SOLN
INTRAMUSCULAR | Status: AC
  Filled 2016-05-08: qty 30

## 2016-05-08 MED ORDER — IOPAMIDOL (ISOVUE-370) INJECTION 76%
INTRAVENOUS | Status: AC
  Filled 2016-05-08: qty 100

## 2016-05-08 MED ORDER — HEPARIN (PORCINE) IN NACL 2-0.9 UNIT/ML-% IJ SOLN
INTRAMUSCULAR | Status: AC
  Filled 2016-05-08: qty 1000

## 2016-05-08 MED ORDER — VERAPAMIL HCL 2.5 MG/ML IV SOLN
INTRAVENOUS | Status: DC | PRN
  Administered 2016-05-08: 10 mL via INTRA_ARTERIAL

## 2016-05-08 MED ORDER — HEPARIN (PORCINE) IN NACL 2-0.9 UNIT/ML-% IJ SOLN
INTRAMUSCULAR | Status: DC | PRN
  Administered 2016-05-08: 1000 mL

## 2016-05-08 SURGICAL SUPPLY — 13 items
CATH INFINITI 5 FR JL3.5 (CATHETERS) ×1 IMPLANT
CATH INFINITI 5FR ANG PIGTAIL (CATHETERS) ×1 IMPLANT
CATH INFINITI JR4 5F (CATHETERS) ×1 IMPLANT
CATH OPTITORQUE TIG 4.0 5F (CATHETERS) ×1 IMPLANT
DEVICE RAD COMP TR BAND LRG (VASCULAR PRODUCTS) ×1 IMPLANT
GLIDESHEATH SLEND SS 6F .021 (SHEATH) ×1 IMPLANT
KIT HEART LEFT (KITS) ×2 IMPLANT
PACK CARDIAC CATHETERIZATION (CUSTOM PROCEDURE TRAY) ×2 IMPLANT
SYR MEDRAD MARK V 150ML (SYRINGE) ×2 IMPLANT
TRANSDUCER W/STOPCOCK (MISCELLANEOUS) ×2 IMPLANT
TUBING CIL FLEX 10 FLL-RA (TUBING) ×2 IMPLANT
WIRE EMERALD 3MM-J .035X260CM (WIRE) ×1 IMPLANT
WIRE HI TORQ VERSACORE-J 145CM (WIRE) ×1 IMPLANT

## 2016-05-08 NOTE — H&P (View-Only) (Signed)
Electrophysiology Office Note   Date:  05/06/2016   ID:  [name], DOB [birth year], MRN [id number]  PCP:  [name] Hey, MD   Primary Electrophysiologist: Dr [name]   Chief Complaint  Patient presents with  . Atrial Fibrillation     History of Present Illness: [name] is a 55 y.o. male who presents today for electrophysiology evaluation.   The patient is s/p afib ablation by me in 2012.  He has done well but continues to have atrial arrhythmias.  He has atrial flutter/ atach with very fast ventricular rates and symptoms of presyncope.  He feels that episodes have worsened.  He is on tikosyn without relief. He was planned for catheter ablation earlier this week.  Preprocedure chest CT revealed concerns for high grade stenosis in the mid circ.  The patient reports chest tightness frequently.  This was quite prominent this past weekend.   He has frequent tachypalpitations with associated SOB and dizziness also.  Based on symptoms and CT findings, I elected to cancel ablation and have him see me today in the office to consider cath.  Today, he denies symptoms of orthopnea, PND, lower extremity edema, claudication, bleeding, or neurologic sequela. The patient is tolerating medications without difficulties and is otherwise without complaint today.    Past Medical History:  Diagnosis Date  . Anginal pain ([place])    freq  goes away quickly  . Atrial fibrillation ([place])    s/p afib ablation  PVI 5/12 JA  . CAD -nonobstructive    S/P nstemi-type II cath 05/2008 - nonobs. dzs.  . Complication of anesthesia   . DDD (degenerative disc disease)    evaluate by neurosurgery is ongoing  . Depression    ptsd  . Erectile dysfunction   . GERD (gastroesophageal reflux disease)   . Headache(784.0)   . Hemorrhoids   . HTN (hypertension)   . Hyperlipemia   . Hyperthyroidism    Graves dz on methimazole and followed by [name]  . Obesity   . Pacemaker-medtronic- AAI    Dual chamber V port plugged   . Pancreatitis   . Polysubstance abuse    cocaine, last used 1999  . PONV (postoperative nausea and vomiting)    2010 after went home -infection  . PUD (peptic ulcer disease)    gastritis due to ETOH previously  . Pulmonary embolism ([place]) 04/2003   Hx of  . Rectal bleeding   . Shortness of breath    LYING DOWN AT TIMES  . Sinus node dysfunction (HCC)    s/p MDT PPM  . Sleep apnea    uses CPAP [facility] 2 YRS  . Systolic and diastolic CHF, chronic ([place])    EF 35% by most recent echo 09/19/10  . 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 ANTERIOR CERVICAL DECOMP/DISCECTOMY FUSION  12/15/2011   Procedure: ANTERIOR CERVICAL DECOMPRESSION/DISCECTOMY FUSION 1 LEVEL;  Surgeon: [name], MD;  Location: [place] [facility];  Service: Neurosurgery;  Laterality: N/A;  Cervical Six-Seven Anterior Cervical Diskectomy fusion with Allograft and Plating  . ATRIAL ABLATION SURGERY  12/2010   afib ablation by [name]  . BACK SURGERY    . CARDIAC CATHETERIZATION     CADIAC ABLATION [name] 12/2010  . Fingers removed from right hand.  2/84   traumatic work injury  . INSERT / REPLACE / REMOVE PACEMAKER  05/2008    . LUMBAR SPINE SURGERY  02/2009   Dr. Trenton Gammon  . PACEMAKER INSERTION  10/09   by Dr [name]     Current Outpatient Prescriptions  Medication Sig Dispense Refill  . carvedilol (COREG) 25 MG tablet Take 1.5 tablets (37.5 mg total) by mouth 2 (two) times daily with a meal. 90 tablet 11  . dabigatran (PRADAXA) 150 MG CAPS capsule Take 1 capsule (150 mg total) by mouth 2 (two) times daily. 22 capsule 0  . dofetilide (TIKOSYN) 500 MCG capsule Take 1 capsule (500 mcg total) by mouth 2 (two) times daily. 180 capsule 2  . fluticasone (FLONASE) 50 MCG/ACT nasal spray Place 2 sprays into both nostrils daily as needed for allergies. 16 g prn  . gabapentin (NEURONTIN) 100 MG capsule Take 100 mg by  mouth 2 (two) times daily.     [name] ibuprofen (ADVIL,MOTRIN) 200 MG tablet Take 600 mg by mouth every 6 (six) hours as needed for headache or moderate pain.    [name] KLOR-CON M20 20 MEQ tablet TAKE 1 TABLET (20 MEQ TOTAL) BY MOUTH DAILY. 30 tablet 0  . methimazole (TAPAZOLE) 10 MG tablet Take 1 tablet (10 mg total) by mouth daily. 30 tablet 1  . omeprazole (PRILOSEC) 40 MG capsule Take 40 mg by mouth daily.     [name] oxyCODONE-acetaminophen (PERCOCET) 10-325 MG per tablet Take 1 tablet by mouth every 4 (four) hours as needed for pain. 29 tablet 0  . prednisoLONE acetate (PRED FORTE) 1 % ophthalmic suspension Place 1 drop into the left eye 4 (four) times daily.  0  . simvastatin (ZOCOR) 40 MG tablet TAKE 1 TABLET (40 MG TOTAL) BY MOUTH DAILY. 30 tablet 11   No current facility-administered medications for this visit.     Allergies:   Celebrex [celecoxib]; Hydrocodone-acetaminophen; and Prednisolone   Social History:  The patient  reports that he quit smoking about 13 years ago. His smoking use included Cigarettes. He has a 15.00 pack-year smoking history. He has never used smokeless tobacco. He reports that he does not drink alcohol or use drugs.   Family History:  The patient's family history includes Breast cancer in his mother; Heart disease in his mother; Lung cancer in his father.    ROS:  Please see the history of present illness.   All other systems are reviewed and negative.    PHYSICAL EXAM: VS:  BP 138/70   Pulse 62   Ht 5\' 11"  (1.803 m)   Wt 246 lb 6.4 oz (111.8 kg)   BMI 34.37 kg/m  , BMI Body mass index is 34.37 kg/m. GEN: Well nourished, well developed, in no acute distress  HEENT: normal  Neck: no JVD, carotid bruits, or masses Cardiac: RRR; no murmurs, rubs, or gallops,no edema  Respiratory:  clear to auscultation bilaterally, normal work of breathing GI: soft, nontender, nondistended, + BS MS: no deformity or atrophy  Skin: warm and dry, device pocket is well healed Neuro:   Strength and sensation are intact Psych: euthymic mood, full affect  Device remote from 05/02/16 is reviewed and reveals afib burden of 14%   Recent Labs: 02/03/2016: ALT 20 03/30/2016: Magnesium 2.1; TSH 0.45 04/24/2016: BUN 17; Creat 1.15; Hemoglobin 13.8; Platelets 222; Potassium 4.2; Sodium 139    Lipid Panel     Component Value Date/Time   CHOL 163 02/03/2016 1026   TRIG 68 02/03/2016 1026   HDL 47 02/03/2016 1026   CHOLHDL 3.5 02/03/2016 1026   VLDL  14 02/03/2016 1026   LDLCALC 102 02/03/2016 1026   LDLDIRECT 163.8 02/08/2008 1003     Wt Readings from Last 3 Encounters:  05/06/16 246 lb 6.4 oz (111.8 kg)  04/15/16 242 lb (109.8 kg)  03/30/16 244 lb 3.2 oz (110.8 kg)     ASSESSMENT AND PLAN:  1.  Atrial arrhythmias S/p prior afib ablation afib burden by remote reveals 14% afib burden I suspect that he will require repeat afib ablation Given abnormal cardiac CT, I have advised cath prior to consideration of ablation (see below).  2. Chest pain/ SOB Recent CT is worrisome for ischemia I would therefore recommend left heart catheterization with possible PCI.  Discussed the cath with the patient. The patient understands that risks included but are not limited to stroke (1 in 1000), death (1 in 60), kidney failure [usually temporary] (1 in 500), bleeding (1 in 200), allergic reaction [possibly serious] (1 in 200). The patient understands and agrees to proceed.  I have spoken with Dr Martinique.  Will hold pradaxa tonight and tomorrow and proceed with cath on Friday.  3. ? Mild pulmonary vein stenosis Noted on CT Asymptomatic Will evaluate further with TEE prior to any further ablations  4. Sick sinus Normal pacemaker function by remote from this weekend (reviewed today) See Pace Art report No changes today  5. OSA Compliance with CPAP is encouarged  6. Nonischemic CM Echo reviewed, EF 40-45%   Current medicines are reviewed at length with the patient today.   The  patient does not have concerns regarding his medicines.  The following changes were made today:  none    Signed, Thompson Grayer, MD  05/06/2016 8:12 AM     [facility] [address] [place] [address] [phone] (office) [phone] (fax)

## 2016-05-08 NOTE — Telephone Encounter (Signed)
New Message  Pt call requesting to speka with RN. Pt states he has some questions abuot his cath procedure that was complete today 9/8. Please call back to discuss

## 2016-05-08 NOTE — Interval H&P Note (Signed)
History and Physical Interval Note:  05/08/2016 9:30 AM  [name]  has presented today for surgery, with the diagnosis of abnormal cardiac CT, ischemic cardiomyopathy  The various methods of treatment have been discussed with the patient and family. After consideration of risks, benefits and other options for treatment, the patient has consented to  Procedure(s): Left Heart Cath and Coronary Angiography (N/A) as a surgical intervention .  The patient's history has been reviewed, patient examined, no change in status, stable for surgery.  I have reviewed the patient's chart and labs.  Questions were answered to the patient's satisfaction.   Cath Lab Visit (complete for each Cath Lab visit)  Clinical Evaluation Leading to the Procedure:   ACS: No.  Non-ACS:    Anginal Classification: CCS II  Anti-ischemic medical therapy: Minimal Therapy (1 class of medications)  Non-Invasive Test Results: Intermediate-risk stress test findings: cardiac mortality 1-3%/year  Prior CABG: No previous CABG        [name] [facility] 05/08/2016 9:31 AM

## 2016-05-08 NOTE — Discharge Instructions (Addendum)
Resume Pradaxa tomorrow September 9 in the morning.    Radial Site Care Refer to this sheet in the next few weeks. These instructions provide you with information about caring for yourself after your procedure. Your health care provider may also give you more specific instructions. Your treatment has been planned according to current medical practices, but problems sometimes occur. Call your health care provider if you have any problems or questions after your procedure. WHAT TO EXPECT AFTER THE PROCEDURE After your procedure, it is typical to have the following:  Bruising at the radial site that usually fades within 1-2 weeks.  Blood collecting in the tissue (hematoma) that may be painful to the touch. It should usually decrease in size and tenderness within 1-2 weeks. HOME CARE INSTRUCTIONS  Take medicines only as directed by your health care provider.  You may shower 24-48 hours after the procedure or as directed by your health care provider. Remove the bandage (dressing) and gently wash the site with plain soap and water. Pat the area dry with a clean towel. Do not rub the site, because this may cause bleeding.  Do not take baths, swim, or use a hot tub until your health care provider approves.  Check your insertion site every day for redness, swelling, or drainage.  Do not apply powder or lotion to the site.  Do not flex or bend the affected arm for 24 hours or as directed by your health care provider.  Do not push or pull heavy objects with the affected arm for 24 hours or as directed by your health care provider.  Do not lift over 10 lb (4.5 kg) for 5 days after your procedure or as directed by your health care provider.  Ask your health care provider when it is okay to:  Return to work or school.  Resume usual physical activities or sports.  Resume sexual activity.  Do not drive home if you are discharged the same day as the procedure. Have someone else drive you.  You  may drive 24 hours after the procedure unless otherwise instructed by your health care provider.  Do not operate machinery or power tools for 24 hours after the procedure.  If your procedure was done as an outpatient procedure, which means that you went home the same day as your procedure, a responsible adult should be with you for the first 24 hours after you arrive home.  Keep all follow-up visits as directed by your health care provider. This is important. SEEK MEDICAL CARE IF:  You have a fever.  You have chills.  You have increased bleeding from the radial site. Hold pressure on the site. SEEK IMMEDIATE MEDICAL CARE IF:  You have unusual pain at the radial site.  You have redness, warmth, or swelling at the radial site.  You have drainage (other than a small amount of blood on the dressing) from the radial site.  The radial site is bleeding, and the bleeding does not stop after 30 minutes of holding steady pressure on the site.  Your arm or hand becomes pale, cool, tingly, or numb.   This information is not intended to replace advice given to you by your health care provider. Make sure you discuss any questions you have with your health care provider.   Document Released: 09/19/2010 Document Revised: 09/07/2014 Document Reviewed: 03/05/2014 Elsevier Interactive Patient Education [employer].

## 2016-05-11 ENCOUNTER — Encounter (HOSPITAL_COMMUNITY): Payer: Self-pay | Admitting: Cardiology

## 2016-05-11 NOTE — Telephone Encounter (Signed)
Left message for patient that he has a follow up appointment on 05/18/16 at 1:45.  He can cal back with questions

## 2016-05-15 ENCOUNTER — Encounter: Payer: Self-pay | Admitting: Internal Medicine

## 2016-05-18 ENCOUNTER — Encounter: Payer: Self-pay | Admitting: Internal Medicine

## 2016-05-18 ENCOUNTER — Ambulatory Visit (INDEPENDENT_AMBULATORY_CARE_PROVIDER_SITE_OTHER): Payer: Medicaid Other | Admitting: Internal Medicine

## 2016-05-18 VITALS — BP 140/70 | HR 141 | Ht 71.0 in | Wt 243.8 lb

## 2016-05-18 DIAGNOSIS — I495 Sick sinus syndrome: Secondary | ICD-10-CM

## 2016-05-18 DIAGNOSIS — I480 Paroxysmal atrial fibrillation: Secondary | ICD-10-CM | POA: Diagnosis not present

## 2016-05-18 DIAGNOSIS — Z950 Presence of cardiac pacemaker: Secondary | ICD-10-CM

## 2016-05-18 LAB — CUP PACEART INCLINIC DEVICE CHECK
Battery Impedance: 433 Ohm
Battery Voltage: 2.79 V
Date Time Interrogation Session: 20170918142703
Implantable Lead Implant Date: 20091023
Implantable Lead Location: 753859
Lead Channel Setting Pacing Amplitude: 2 V
MDC IDC MSMT BATTERY REMAINING LONGEVITY: 121 mo
MDC IDC MSMT LEADCHNL RA IMPEDANCE VALUE: 469 Ohm
MDC IDC MSMT LEADCHNL RA SENSING INTR AMPL: 2 mV
MDC IDC MSMT LEADCHNL RV IMPEDANCE VALUE: 67 Ohm
MDC IDC STAT BRADY RA PERCENT PACED: 12 %

## 2016-05-18 NOTE — Patient Instructions (Addendum)
Medication Instructions:  Your physician recommends that you continue on your current medications as directed. Please refer to the Current Medication list given to you today.   Labwork: Your physician recommends that you return for lab work today: BMP/CBC   Testing/Procedures:  Your physician has recommended that you have an ablation. Catheter ablation is a medical procedure used to treat some cardiac arrhythmias (irregular heartbeats). During catheter ablation, a long, thin, flexible tube is put into a blood vessel in your groin (upper thigh), or neck. This tube is called an ablation catheter. It is then guided to your heart through the blood vessel. Radio frequency waves destroy small areas of heart tissue where abnormal heartbeats may cause an arrhythmia to start. Please see the instruction sheet given to you today.---05/26/16  Your physician has requested that you have a TEE. During a TEE, sound waves are used to create images of your heart. It provides your doctor with information about the size and shape of your heart and how well your heart's chambers and valves are working. In this test, a transducer is attached to the end of a flexible tube that's guided down your throat and into your esophagus (the tube leading from you mouth to your stomach) to get a more detailed image of your heart. You are not awake for the procedure. Please see the instruction sheet given to you today. For further information please visit [email or web address]      Please report to [facility]tr[facility] of [facility] on 05/26/16 at 8:30am Do not eat or drink after midnight the night prior to your procedure Do not take any medications the morning of the procedure Plan for one night stay Can not drive for 3 days after procedure    Follow-Up:   Your physician recommends that you schedule a follow-up appointment in: 4 weeks from 05/26/16 with Roderic Palau, NP and 3 months from 05/26/16  with [name]   Any Other Special Instructions Will Be Listed Below (If Applicable).     If you need a refill on your cardiac medications before your next appointment, please call your pharmacy.

## 2016-05-18 NOTE — Progress Notes (Signed)
Electrophysiology Office Note   Date:  05/18/2016   ID:  [name], DOB September 18, 1960, MRN [id number]  PCP:  Ricke Hey, MD   Primary Electrophysiologist: Dr [name]   Chief Complaint  Patient presents with  . Atrial Fibrillation     History of Present Illness: [name] is a 55 y.o. male who presents today for electrophysiology evaluation.   The patient is s/p afib ablation by me in 2012.  He has done well but continues to have atrial arrhythmias.  He has atrial flutter/ atach with very fast ventricular rates and symptoms of presyncope.  He feels that episodes have worsened.  He is on tikosyn without relief. He recently underwent LHC.  He did not have any obstructive lesions.  Medical therapy is advised. He has frequent tachypalpitations with associated SOB and dizziness which appear to be due to his afib. Today, he denies symptoms of orthopnea, PND, lower extremity edema, claudication, bleeding, or neurologic sequela. The patient is tolerating medications without difficulties and is otherwise without complaint today.    Past Medical History:  Diagnosis Date  . Anginal pain ([place])    freq  goes away quickly  . Atrial fibrillation ([place])    s/p afib ablation  PVI 5/12 JA  . CAD -nonobstructive    S/P nstemi-type II cath 05/2008 - nonobs. dzs.  . Complication of anesthesia   . DDD (degenerative disc disease)    evaluate by neurosurgery is ongoing  . Depression    ptsd  . Erectile dysfunction   . GERD (gastroesophageal reflux disease)   . Headache(784.0)   . Hemorrhoids   . HTN (hypertension)   . Hyperlipemia   . Hyperthyroidism    Graves dz on methimazole and followed by [name]  . Obesity   . Pacemaker-medtronic- AAI    Dual chamber V port plugged   . Pancreatitis   . Polysubstance abuse    cocaine, last used 1999  . PONV (postoperative nausea and vomiting)    2010 after went home -infection  . PUD (peptic ulcer disease)    gastritis due to  ETOH previously  . Pulmonary embolism ([place]) 04/2003   Hx of  . Rectal bleeding   . Shortness of breath    LYING DOWN AT TIMES  . Sinus node dysfunction (H[facility])    s/p MDT PPM  . Sleep apnea    uses CPAP [facility] SLEEP CTR 2 YRS  . Systolic and diastolic CHF, chronic ([place])    EF 35% by most recent echo 09/19/10  . 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 ANTERIOR CERVICAL DECOMP/DISCECTOMY FUSION  12/15/2011   Procedure: ANTERIOR CERVICAL DECOMPRESSION/DISCECTOMY FUSION 1 LEVEL;  Surgeon: [name], MD;  Location: [place] [facility];  Service: Neurosurgery;  Laterality: N/A;  Cervical Six-Seven Anterior Cervical Diskectomy fusion with Allograft and Plating  . ATRIAL ABLATION SURGERY  12/2010   afib ablation by [name]  . BACK SURGERY    . CARDIAC CATHETERIZATION     CADIAC ABLATION [name] 12/2010  . CARDIAC CATHETERIZATION N/A 05/08/2016   Procedure: Left Heart Cath and Coronary Angiography;  Surgeon: Peter M Martinique, MD;  Location: [place] [facility];  Service: Cardiovascular;  Laterality: N/A;  . Fingers removed from right hand.  2/84   traumatic work injury  . INSERT / REPLACE / REMOVE PACEMAKER     05/2008    .  LUMBAR SPINE SURGERY  02/2009   Dr. Trenton Gammon  . PACEMAKER INSERTION  10/09   by Dr [name]     Current Outpatient Prescriptions  Medication Sig Dispense Refill  . carvedilol (COREG) 25 MG tablet Take 1 tablet (25 mg total) by mouth 2 (two) times daily with a meal. 180 tablet 11  . cyclobenzaprine (FLEXERIL) 10 MG tablet Take 1 tablet by mouth 3 (three) times daily as needed. Muscle spasms  2  . dofetilide (TIKOSYN) 500 MCG capsule Take 1 capsule (500 mcg total) by mouth 2 (two) times daily. 180 capsule 2  . fluticasone (FLONASE) 50 MCG/ACT nasal spray Place 2 sprays into both nostrils daily as needed for allergies. 16 g prn  . gabapentin (NEURONTIN) 100 MG capsule Take 100 mg by mouth 2 (two) times daily.     [name]  KLOR-CON M20 20 MEQ tablet TAKE 1 TABLET (20 MEQ TOTAL) BY MOUTH DAILY. 30 tablet 0  . methimazole (TAPAZOLE) 10 MG tablet Take 1 tablet (10 mg total) by mouth daily. 30 tablet 1  . omeprazole (PRILOSEC) 40 MG capsule Take 40 mg by mouth daily.     [name] oxyCODONE-acetaminophen (PERCOCET) 10-325 MG per tablet Take 1 tablet by mouth every 4 (four) hours as needed for pain. 29 tablet 0  . prednisoLONE acetate (PRED FORTE) 1 % ophthalmic suspension Place 1 drop into the left eye 4 (four) times daily.  0  . simvastatin (ZOCOR) 40 MG tablet TAKE 1 TABLET (40 MG TOTAL) BY MOUTH DAILY. 30 tablet 11   No current facility-administered medications for this visit.     Allergies:   Celebrex [celecoxib]; Hydrocodone-acetaminophen; and Prednisolone   Social History:  The patient  reports that he quit smoking about 13 years ago. His smoking use included Cigarettes. He has a 15.00 pack-year smoking history. He has never used smokeless tobacco. He reports that he does not drink alcohol or use drugs.   Family History:  The patient's family history includes Breast cancer in his mother; Heart disease in his mother; Lung cancer in his father.    ROS:  Please see the history of present illness.   All other systems are reviewed and negative.    PHYSICAL EXAM: VS:  BP 140/70   Pulse (!) 141   Ht 5\' 11"  (1.803 m)   Wt 243 lb 12.8 oz (110.6 kg)   BMI 34.00 kg/m  , BMI Body mass index is 34 kg/m. GEN: Well nourished, well developed, in no acute distress  HEENT: normal  Neck: no JVD, carotid bruits, or masses Cardiac: RRR; no murmurs, rubs, or gallops,no edema  Respiratory:  clear to auscultation bilaterally, normal work of breathing GI: soft, nontender, nondistended, + BS MS: no deformity or atrophy  Skin: warm and dry, device pocket is well healed Neuro:  Strength and sensation are intact Psych: euthymic mood, full affect  Device remote from 05/02/16 is reviewed and reveals afib burden of 14%   Recent  Labs: 02/03/2016: ALT 20 03/30/2016: Magnesium 2.1; TSH 0.45 05/06/2016: BUN 15; Creat 0.96; Hemoglobin 14.2; Platelets 210; Potassium 4.4; Sodium 143    Lipid Panel     Component Value Date/Time   CHOL 163 02/03/2016 1026   TRIG 68 02/03/2016 1026   HDL 47 02/03/2016 1026   CHOLHDL 3.5 02/03/2016 1026   VLDL 14 02/03/2016 1026   LDLCALC 102 02/03/2016 1026   LDLDIRECT 163.8 02/08/2008 1003     Wt Readings from Last 3 Encounters:  05/18/16 243 lb 12.8  oz (110.6 kg)  05/08/16 244 lb (110.7 kg)  05/06/16 246 lb 6.4 oz (111.8 kg)     ASSESSMENT AND PLAN:  1.  Atrial arrhythmias S/p prior afib ablation afib burden by remote reveals 18% afib burden Therapeutic strategies for afib including medicine and ablation were discussed in detail with the patient today. Risk, benefits, and alternatives to EP study and radiofrequency ablation for afib were also discussed in detail today. These risks include but are not limited to stroke, bleeding, vascular damage, tamponade, perforation, damage to the esophagus, lungs, and other structures, pulmonary vein stenosis, pacemaker lead dislodgement, worsening renal function, and death. The patient understands these risk and wishes to proceed.  We will therefore proceed with catheter ablation at the next available time.  Reports compliance with pradaxa.  2. Chest pain/ SOB Cath reviewed Medical therapy advised  3. ? Mild pulmonary vein stenosis Noted on CT Asymptomatic Will evaluate further with TEE prior to his ablation.  4. Sick sinus Normal pacemaker function CXR reveals that the atrial lead has no lead slack.   See Pace Art report No changes today  5. OSA Compliance with CPAP is encouarged  6. Nonischemic CM Echo reviewed, EF 40-45%   Current medicines are reviewed at length with the patient today.   The patient does not have concerns regarding his medicines.  The following changes were made today:  none    Signed, Thompson Grayer, MD   05/18/2016 2:10 PM     [facility] [address] [place] [address] [phone] (office) [phone] (fax)

## 2016-05-25 ENCOUNTER — Telehealth: Payer: Self-pay | Admitting: Internal Medicine

## 2016-05-25 NOTE — Telephone Encounter (Signed)
Spoke with patient and he has an appointment that he can not miss and wants to cancel the TEE and ablation for tomorrow.  He will call back when he is ready to schedule.  EP lab, CARTO and TEE all notified

## 2016-05-25 NOTE — Telephone Encounter (Signed)
Mr.Peake is calling because he is schedule to have an ablation on  tomorrow and he says he needs to cancel that . Please call .[name] Thanks

## 2016-05-26 ENCOUNTER — Encounter (HOSPITAL_COMMUNITY): Payer: Self-pay

## 2016-05-26 ENCOUNTER — Ambulatory Visit (HOSPITAL_COMMUNITY): Admit: 2016-05-26 | Payer: Medicaid Other | Admitting: Internal Medicine

## 2016-05-26 SURGERY — ATRIAL FIBRILLATION ABLATION
Anesthesia: Monitor Anesthesia Care

## 2016-05-26 SURGERY — ECHOCARDIOGRAM, TRANSESOPHAGEAL
Anesthesia: Moderate Sedation

## 2016-06-15 ENCOUNTER — Ambulatory Visit (HOSPITAL_COMMUNITY): Payer: Medicaid Other | Admitting: Nurse Practitioner

## 2016-06-15 ENCOUNTER — Other Ambulatory Visit: Payer: Self-pay | Admitting: Internal Medicine

## 2016-06-23 ENCOUNTER — Ambulatory Visit (HOSPITAL_COMMUNITY): Payer: Medicaid Other | Admitting: Nurse Practitioner

## 2016-06-23 ENCOUNTER — Telehealth: Payer: Self-pay | Admitting: Internal Medicine

## 2016-06-23 NOTE — Telephone Encounter (Signed)
Patient has missed 3 doses of his Tikosyn. Consulted Dr. Lovena Le, since Dr. [name] is out. Dr. Lovena Le recommends patient to start back today, since he is picking up his medication at this time at [facility]. Called patient back and informed him of Dr. Tanna Furry recommendation. Patient verbalized understanding.

## 2016-06-23 NOTE — Telephone Encounter (Signed)
[name] is calling because he missed 3 dosages of his  Tioskyn and wants to know is it ok for him to take them today . He has picked up the refill on today and has not been in Afib, States he is ok . Please call  Thanks

## 2016-06-24 ENCOUNTER — Encounter (HOSPITAL_COMMUNITY): Payer: Self-pay | Admitting: Nurse Practitioner

## 2016-06-24 ENCOUNTER — Ambulatory Visit (HOSPITAL_COMMUNITY)
Admission: RE | Admit: 2016-06-24 | Discharge: 2016-06-24 | Disposition: A | Discharge status: Home / Self Care | Payer: Medicaid Other | Source: Ambulatory Visit | Attending: Nurse Practitioner | Admitting: Nurse Practitioner

## 2016-06-24 VITALS — BP 140/90 | HR 134 | Ht 71.0 in | Wt 253.4 lb

## 2016-06-24 DIAGNOSIS — I444 Left anterior fascicular block: Secondary | ICD-10-CM | POA: Diagnosis not present

## 2016-06-24 DIAGNOSIS — Z7901 Long term (current) use of anticoagulants: Secondary | ICD-10-CM | POA: Diagnosis not present

## 2016-06-24 DIAGNOSIS — I2510 Atherosclerotic heart disease of native coronary artery without angina pectoris: Secondary | ICD-10-CM | POA: Diagnosis not present

## 2016-06-24 DIAGNOSIS — I4891 Unspecified atrial fibrillation: Secondary | ICD-10-CM | POA: Diagnosis present

## 2016-06-24 DIAGNOSIS — Z87891 Personal history of nicotine dependence: Secondary | ICD-10-CM | POA: Insufficient documentation

## 2016-06-24 DIAGNOSIS — G4733 Obstructive sleep apnea (adult) (pediatric): Secondary | ICD-10-CM | POA: Insufficient documentation

## 2016-06-24 DIAGNOSIS — I495 Sick sinus syndrome: Secondary | ICD-10-CM | POA: Diagnosis not present

## 2016-06-24 DIAGNOSIS — Z79899 Other long term (current) drug therapy: Secondary | ICD-10-CM | POA: Insufficient documentation

## 2016-06-24 DIAGNOSIS — I480 Paroxysmal atrial fibrillation: Secondary | ICD-10-CM

## 2016-06-24 NOTE — Progress Notes (Signed)
Primary Care Physician: Ricke Hey, MD Referring Physician: Dr. Letta Pate is a 55 y.o. male that presents for evaluation in the afib clinic.The patient is s/p afib ablation by Dr. Rayann Heman in 2012.  He has done well but continues to have atrial arrhythmias.  He has atrial flutter/ atach with very fast ventricular rates and symptoms of presyncope.  He feels that episodes have worsened. He is on tikosyn without relief. He recently underwent LHC.  He did not have any obstructive lesions.  Medical therapy was advised. He was scheduled for ablation in September but had a conflict and cancelled procedure. He states that he is not ready to schedule ablation but thinks he will be able to schedule after 11/13. He will contact the office when ready to schedule.  He has frequent tachypalpitations with associated SOB and dizziness which appear to be due to his afib. He is in afib with RVR, today, but is asymptomatic. Per pt he is in and out of afib and he currently feels well despite afib today at 134 bpm. On last visit with Dr. Rayann Heman, he was also in afib with rvr and carvedilol was increased to 37.5 mg bid. He did not tolerate this dose and reduced back to 25 mg bid. He is on pradaxa without bleeding issues.  Today, he denies symptoms of palpitations, chest pain, shortness of breath, orthopnea, PND, lower extremity edema, dizziness, presyncope, syncope, or neurologic sequela. The patient is tolerating medications without difficulties and is otherwise without complaint today.   Past Medical History:  Diagnosis Date  . Atrial fibrillation ([place])    s/p afib ablation  PVI 5/12 JA  . CAD -nonobstructive    S/P nstemi-type II cath 05/2008 - nonobs. dzs.  . DDD (degenerative disc disease)    evaluate by neurosurgery is ongoing  . Depression    ptsd  . Erectile dysfunction   . Headache(784.0)   . Hemorrhoids   . HTN (hypertension)   . Hyperlipemia   . Hyperthyroidism    Graves  dz on methimazole and followed by [name]  . Obesity   . Pancreatitis   . Polysubstance abuse    cocaine, last used 1999  . PUD (peptic ulcer disease)    gastritis due to ETOH previously  . Pulmonary embolism ([place]) 04/2003   Hx of  . Rectal bleeding   . Sinus node dysfunction ([facility])    s/p MDT PPM - AAI - V port plugged  . Sleep apnea    uses CPAP [facility] SLEEP CTR 2 YRS  . Systolic and diastolic CHF, chronic ([place])    EF 35% by most recent echo 09/19/10  . 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 ANTERIOR CERVICAL DECOMP/DISCECTOMY FUSION  12/15/2011   Procedure: ANTERIOR CERVICAL DECOMPRESSION/DISCECTOMY FUSION 1 LEVEL;  Surgeon: [name], MD;  Location: [facility];  Service: Neurosurgery;  Laterality: N/A;  Cervical Six-Seven Anterior Cervical Diskectomy fusion with Allograft and Plating  . ATRIAL ABLATION SURGERY  12/2010   afib ablation by [name]  . BACK SURGERY    . CARDIAC CATHETERIZATION     CADIAC ABLATION [name] 12/2010  . CARDIAC CATHETERIZATION N/A 05/08/2016   Procedure: Left Heart Cath and Coronary Angiography;  Surgeon: Peter M Martinique, MD;  Location: [facility];  Service: Cardiovascular;  Laterality: N/A;  . Fingers removed from right hand.  2/84   traumatic work injury  . INSERT / REPLACE / REMOVE PACEMAKER     05/2008    . LUMBAR SPINE SURGERY  02/2009   Dr. Trenton Gammon  . PACEMAKER INSERTION  10/09   by Dr [name]    Current Outpatient Prescriptions  Medication Sig Dispense Refill  . carvedilol (COREG) 25 MG tablet Take 1 tablet (25 mg total) by mouth 2 (two) times daily with a meal. 180 tablet 11  . dabigatran (PRADAXA) 150 MG CAPS capsule Take 150 mg by mouth 2 (two) times daily.    [name] gabapentin (NEURONTIN) 100 MG capsule Take 100 mg by mouth 2 (two) times daily.     [name] KLOR-CON M20 20 MEQ tablet TAKE 1 TABLET (20 MEQ TOTAL) BY MOUTH DAILY. 30 tablet 0  . methimazole (TAPAZOLE) 10 MG  tablet Take 1 tablet (10 mg total) by mouth daily. 30 tablet 1  . omeprazole (PRILOSEC) 40 MG capsule Take 40 mg by mouth 2 (two) times daily.     [name] oxyCODONE-acetaminophen (PERCOCET) 10-325 MG per tablet Take 1 tablet by mouth every 4 (four) hours as needed for pain. 29 tablet 0  . simvastatin (ZOCOR) 40 MG tablet TAKE 1 TABLET (40 MG TOTAL) BY MOUTH DAILY. 30 tablet 11  . TIKOSYN 500 MCG capsule TAKE 1 CAPSULE (500 MCG TOTAL) BY MOUTH 2 (TWO) TIMES DAILY. 180 capsule 2   No current facility-administered medications for this encounter.     Allergies  Allergen Reactions  . Celebrex [Celecoxib] Itching  . Prednisolone Itching    Social History   Social History  . Marital status: Legally Separated    Spouse name: N/A  . Number of children: 7  . Years of education: N/A   Occupational History  . unemployed Unemployed   Social History Main Topics  . Smoking status: Former Smoker    Packs/day: 1.00    Years: 15.00    Types: Cigarettes    Quit date: 04/04/2003  . Smokeless tobacco: Never Used  . Alcohol use No     Comment: formerly heavy ETOH,  now drinks a fifth of liquor every 2 weeks  . Drug use: No     Comment: previously used crack cocaine, quit 1999,  + marijuana  . Sexual activity: Yes    Birth control/ protection: Condom   Other Topics Concern  . Not on file   Social History Narrative   Unemployed currently.  Previously worked as a Dealer.  Lives in [place].    Family History  Problem Relation Age of Onset  . Heart disease Mother   . Breast cancer Mother   . Lung cancer Father   . Diabetes    . Colon cancer Neg Hx     ROS- All systems are reviewed and negative except as per the HPI above  Physical Exam: Vitals:   06/24/16 1014  BP: 140/90  Pulse: (!) 134  Weight: 253 lb 6.4 oz (114.9 kg)  Height: 5\' 11"  (1.803 m)    GEN- The patient is well appearing, alert and oriented x 3 today.   Head- normocephalic, atraumatic Eyes-  Sclera clear,  conjunctiva pink Ears- hearing intact Oropharynx- clear Neck- supple, no JVP Lymph- no cervical lymphadenopathy Lungs- Clear to ausculation bilaterally, normal work of breathing Heart- Regular rate and rhythm, no murmurs, rubs or gallops, PMI not laterally displaced GI- soft, NT, ND, + BS Extremities- no clubbing, cyanosis, or edema MS- no significant deformity or atrophy Skin- no rash or lesion  Psych- euthymic mood, full affect Neuro- strength and sensation are intact  EKG-afib with RVR at 134 bpm, LAFB qrs int 104 nsm qtc 442 bpm Epic records reviewed Device remote from 05/02/16 is reviewed and reveals afib burden of 14%    Assessment and Plan: 1. Paroxysmal afib Pt is ready not ready to reschedule ablation but will call after 11/13 to reschedule Continue carvedilol at 25 mg bid  Continue pradaxa at 150 mg bid  2. OSA Compliance with cpap encouraged  3. Nonobstructive CAD Medical management Currently chest pain free  4. Sick sinus Normal pacemaker function when last evaluated with Dr. Rayann Heman   F/u per pt when ready to reschedule ablation  Butch Penny C. Carroll, [facility] [address] [place], [place] [address] [phone]

## 2016-07-15 ENCOUNTER — Telehealth: Payer: Self-pay | Admitting: Cardiology

## 2016-07-15 ENCOUNTER — Encounter: Payer: Medicaid Other | Admitting: *Deleted

## 2016-07-15 NOTE — Telephone Encounter (Signed)
Opened in error

## 2016-07-15 NOTE — Telephone Encounter (Signed)
LMOVM reminding pt to send remote transmission.   

## 2016-08-06 ENCOUNTER — Other Ambulatory Visit: Payer: Self-pay | Admitting: Internal Medicine

## 2016-08-12 ENCOUNTER — Encounter: Payer: Self-pay | Admitting: Internal Medicine

## 2016-08-17 ENCOUNTER — Encounter: Payer: Medicaid Other | Admitting: Internal Medicine

## 2016-08-18 ENCOUNTER — Encounter: Payer: Self-pay | Admitting: Internal Medicine

## 2016-08-26 ENCOUNTER — Encounter: Payer: Medicaid Other | Admitting: Internal Medicine

## 2016-09-07 ENCOUNTER — Ambulatory Visit (INDEPENDENT_AMBULATORY_CARE_PROVIDER_SITE_OTHER): Payer: Medicaid Other | Admitting: Internal Medicine

## 2016-09-07 ENCOUNTER — Encounter: Payer: Self-pay | Admitting: Internal Medicine

## 2016-09-07 VITALS — BP 126/84 | HR 64 | Ht 71.0 in | Wt 266.0 lb

## 2016-09-07 DIAGNOSIS — I495 Sick sinus syndrome: Secondary | ICD-10-CM

## 2016-09-07 DIAGNOSIS — Z950 Presence of cardiac pacemaker: Secondary | ICD-10-CM | POA: Diagnosis not present

## 2016-09-07 DIAGNOSIS — I480 Paroxysmal atrial fibrillation: Secondary | ICD-10-CM

## 2016-09-07 LAB — CUP PACEART INCLINIC DEVICE CHECK
Battery Impedance: 481 Ohm
Battery Remaining Longevity: 114 mo
Brady Statistic RA Percent Paced: 19 %
Date Time Interrogation Session: 20180108152014
Implantable Lead Location: 753859
Implantable Lead Model: 5076
Lead Channel Setting Pacing Amplitude: 2 V
MDC IDC LEAD IMPLANT DT: 20091023
MDC IDC MSMT BATTERY VOLTAGE: 2.79 V
MDC IDC MSMT LEADCHNL RA IMPEDANCE VALUE: 465 Ohm
MDC IDC MSMT LEADCHNL RA PACING THRESHOLD AMPLITUDE: 0.5 V
MDC IDC MSMT LEADCHNL RA PACING THRESHOLD PULSEWIDTH: 0.4 ms
MDC IDC MSMT LEADCHNL RA SENSING INTR AMPL: 2.8 mV
MDC IDC MSMT LEADCHNL RV IMPEDANCE VALUE: 67 Ohm
MDC IDC PG IMPLANT DT: 20091023

## 2016-09-07 MED ORDER — APIXABAN 5 MG PO TABS: 5.0000 mg | ORAL_TABLET | Freq: Two times a day (BID) | ORAL | 11 refills | Status: DC

## 2016-09-07 NOTE — Progress Notes (Signed)
Electrophysiology Office Note   Date:  09/07/2016   ID:  [name], DOB [birth year], MRN [id number]  PCP:  Ricke Hey, MD   Primary Electrophysiologist: Dr [name]   Chief Complaint  Patient presents with  . Pacemaker Check     History of Present Illness: [name] is a 56 y.o. male who presents today for electrophysiology evaluation.   The patient is s/p afib ablation by me in 2012.  He has done well but continues to have atrial arrhythmias.  He has atrial flutter/ atach with very fast ventricular rates and symptoms of presyncope.   I saw him several months ago and offered ablation.  He agreed but cancelled the procedure last minute.  He has been seen back in the AF clinic since then but has been noncompliant with follow-up here.  He is living in [place] which makes follow-up difficulty for him.  He says that he is planning to move back to [place] soon. He seems to be doing "much better".  Arrhythmias are shorter and infrequent though overall burden is about the same.  He has occasional chest discomfort which he attributes to indigestion. He recently underwent LHC.  He did not have any obstructive lesions.  Medical therapy is advised.  Today, he denies symptoms of orthopnea, PND, lower extremity edema, claudication, bleeding, or neurologic sequela. The patient is tolerating medications without difficulties and is otherwise without complaint today.    Past Medical History:  Diagnosis Date  . Atrial fibrillation ([place])    s/p afib ablation  PVI 5/12 JA  . CAD -nonobstructive    S/P nstemi-type II cath 05/2008 - nonobs. dzs.  . DDD (degenerative disc disease)    evaluate by neurosurgery is ongoing  . Depression    ptsd  . Erectile dysfunction   . Headache(784.0)   . Hemorrhoids   . HTN (hypertension)   . Hyperlipemia   . Hyperthyroidism    Graves dz on methimazole and followed by [name]  . Obesity   . Pancreatitis   . Polysubstance abuse    cocaine, last used 1999  . PUD (peptic ulcer disease)    gastritis due to ETOH previously  . Pulmonary embolism ([place]) 04/2003   Hx of  . Rectal bleeding   . Sinus node dysfunction (H[facility])    s/p MDT PPM - AAI - V port plugged  . Sleep apnea    uses CPAP [facility] SLEEP CTR 2 YRS  . Systolic and diastolic CHF, chronic ([place])    EF 35% by most recent echo 09/19/10  . 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 ANTERIOR CERVICAL DECOMP/DISCECTOMY FUSION  12/15/2011   Procedure: ANTERIOR CERVICAL DECOMPRESSION/DISCECTOMY FUSION 1 LEVEL;  Surgeon: [name], MD;  Location: [facility];  Service: Neurosurgery;  Laterality: N/A;  Cervical Six-Seven Anterior Cervical Diskectomy fusion with Allograft and Plating  . ATRIAL ABLATION SURGERY  12/2010   afib ablation by [name]  . BACK SURGERY    . CARDIAC CATHETERIZATION     CADIAC ABLATION [name] 12/2010  . CARDIAC CATHETERIZATION N/A 05/08/2016   Procedure: Left Heart Cath and Coronary Angiography;  Surgeon: Peter M Martinique, MD;  Location: [facility];  Service: Cardiovascular;  Laterality: N/A;  . Fingers removed from right hand.  2/84   traumatic work injury  . INSERT / REPLACE / REMOVE PACEMAKER     05/2008    .  LUMBAR SPINE SURGERY  02/2009   Dr. Trenton Gammon  . PACEMAKER INSERTION  10/09   by Dr [name]     Current Outpatient Prescriptions  Medication Sig Dispense Refill  . carvedilol (COREG) 25 MG tablet Take 1 tablet (25 mg total) by mouth 2 (two) times daily with a meal. 180 tablet 11  . gabapentin (NEURONTIN) 100 MG capsule Take 100 mg by mouth 2 (two) times daily.     [name] KLOR-CON M20 20 MEQ tablet TAKE 1 TABLET (20 MEQ TOTAL) BY MOUTH DAILY. 30 tablet 0  . methimazole (TAPAZOLE) 10 MG tablet Take 1 tablet (10 mg total) by mouth daily. 30 tablet 1  . omeprazole (PRILOSEC) 40 MG capsule Take 40 mg by mouth 2 (two) times daily.     [name] oxyCODONE-acetaminophen (PERCOCET)  10-325 MG per tablet Take 1 tablet by mouth every 4 (four) hours as needed for pain. 29 tablet 0  . PRADAXA 150 MG CAPS capsule TAKE ONE CAPSULE BY MOUTH EVERY 12 HOURS 120 capsule 3  . simvastatin (ZOCOR) 40 MG tablet TAKE 1 TABLET (40 MG TOTAL) BY MOUTH DAILY. 30 tablet 11  . TIKOSYN 500 MCG capsule TAKE 1 CAPSULE (500 MCG TOTAL) BY MOUTH 2 (TWO) TIMES DAILY. 180 capsule 2   No current facility-administered medications for this visit.     Allergies:   Celebrex [celecoxib] and Prednisolone   Social History:  The patient  reports that he quit smoking about 13 years ago. His smoking use included Cigarettes. He has a 15.00 pack-year smoking history. He has never used smokeless tobacco. He reports that he does not drink alcohol or use drugs.   Family History:  The patient's family history includes Breast cancer in his mother; Heart disease in his mother; Lung cancer in his father.    ROS:  Please see the history of present illness.   All other systems are reviewed and negative.    PHYSICAL EXAM: VS:  BP 126/84   Pulse 64   Ht 5\' 11"  (1.803 m)   Wt 266 lb (120.7 kg)   BMI 37.10 kg/m  , BMI Body mass index is 37.1 kg/m. GEN: Well nourished, well developed, in no acute distress  HEENT: normal  Neck: no JVD, carotid bruits, or masses Cardiac: RRR; no murmurs, rubs, or gallops,no edema  Respiratory:  clear to auscultation bilaterally, normal work of breathing GI: soft, nontender, nondistended, + BS MS: no deformity or atrophy  Skin: warm and dry, device pocket is well healed Neuro:  Strength and sensation are intact Psych: euthymic mood, full affect  Device remote from 05/02/16 is reviewed and reveals afib burden of 14%   Recent Labs: 02/03/2016: ALT 20 03/30/2016: Magnesium 2.1; TSH 0.45 05/06/2016: BUN 15; Creat 0.96; Hemoglobin 14.2; Platelets 210; Potassium 4.4; Sodium 143    Lipid Panel     Component Value Date/Time   CHOL 163 02/03/2016 1026   TRIG 68 02/03/2016 1026   HDL  47 02/03/2016 1026   CHOLHDL 3.5 02/03/2016 1026   VLDL 14 02/03/2016 1026   LDLCALC 102 02/03/2016 1026   LDLDIRECT 163.8 02/08/2008 1003     Wt Readings from Last 3 Encounters:  09/07/16 266 lb (120.7 kg)  06/24/16 253 lb 6.4 oz (114.9 kg)  05/18/16 243 lb 12.8 oz (110.6 kg)     ASSESSMENT AND PLAN:  1.  Atrial arrhythmias S/p prior afib ablation Atach/afib burden is currently14.8% (previuosly 18%) Therapeutic strategies for afib including medicine and ablation were discussed in detail  with the patient today. At this time, he does not wish to proceed with ablation.  He may reconsider if his atrial arrhythmias worsen.  Continue tikosyn and coreg. Bmet, mg today Given frequent chest discomfort, I worry about dyspepsia related to pradaxa.  I will therefore stop pradaxa today and start eliquis.  Risks of eliquis discussed with the patient who wishes to proceed.  2. Sick sinus Normal pacemaker function See Pace Art report No changes today  3. OSA Compliance with CPAP is encouarged  4. Nonischemic CM Echo reviewed, EF 40-45%  carelink Return to se Dr [name] in 6 months If he decides to proceed with ablation then I am happy to see him back at that time.  Current medicines are reviewed at length with the patient today.   The patient does not have concerns regarding his medicines.  The following changes were made today:  none    Signed, Thompson Grayer, MD  09/07/2016 2:44 PM     [facility] [address] [place]  [address] [phone] (office) [phone] (fax)

## 2016-09-07 NOTE — Patient Instructions (Addendum)
Medication Instructions:  Your physician has recommended you make the following change in your medication:  1) Stop Pradaxa 2) Start Eliquis 5 mg twice daily    Labwork: Your physician recommends that you return for lab work today: BMP/MAG    Testing/Procedures: None ordered   Follow-Up: Remote monitoring is used to monitor your Pacemaker  from home. This monitoring reduces the number of office visits required to check your device to one time per year. It allows [name] to keep an eye on the functioning of your device to ensure it is working properly. You are scheduled for a device check from home on 12/07/16. You may send your transmission at any time that day. If you have a wireless device, the transmission will be sent automatically. After your physician reviews your transmission, you will receive a postcard with your next transmission date.   Your physician wants you to follow-up in:6 months with Dr Gari Crown will receive a reminder letter in the mail two months in advance. If you don't receive a letter, please call our office to schedule the follow-up appointment.    Any Other Special Instructions Will Be Listed Below (If Applicable).     If you need a refill on your cardiac medications before your next appointment, please call your pharmacy.

## 2016-09-08 LAB — BASIC METABOLIC PANEL
BUN/Creatinine Ratio: 10 (ref 9–20)
BUN: 10 mg/dL (ref 6–24)
CALCIUM: 9.4 mg/dL (ref 8.7–10.2)
CO2: 21 mmol/L (ref 18–29)
Chloride: 101 mmol/L (ref 96–106)
Creatinine, Ser: 0.97 mg/dL (ref 0.76–1.27)
GFR, EST AFRICAN AMERICAN: 100 mL/min/{1.73_m2} (ref >59)
GFR, EST NON AFRICAN AMERICAN: 86 mL/min/{1.73_m2} (ref >59)
Glucose: 83 mg/dL (ref 65–99)
POTASSIUM: 4.4 mmol/L (ref 3.5–5.2)
Sodium: 139 mmol/L (ref 134–144)

## 2016-09-08 LAB — MAGNESIUM: Magnesium: 2 mg/dL (ref 1.6–2.3)

## 2016-10-01 ENCOUNTER — Ambulatory Visit (HOSPITAL_COMMUNITY): Payer: Medicaid Other | Admitting: Nurse Practitioner

## 2016-10-05 ENCOUNTER — Telehealth: Payer: Self-pay | Admitting: Internal Medicine

## 2016-10-05 NOTE — Telephone Encounter (Signed)
Pending PA [id number] for tikosyn.

## 2016-10-06 NOTE — Telephone Encounter (Signed)
Pt dofetilide 542mcg BID approved 10/05/16 - 09/30/17. [place] [id number]

## 2016-10-07 NOTE — Telephone Encounter (Signed)
Notified pt regarding PA approval for tikosyn.

## 2016-11-09 ENCOUNTER — Telehealth: Payer: Self-pay | Admitting: Internal Medicine

## 2016-11-09 NOTE — Telephone Encounter (Signed)
[name] is calling because he has misplaced his Tioskyn , he had 180 and they gave it to him in 3 different bottles and he has misplaced two of those 3 bottles. He has missed 4 doses already . Wants to know if he can get some more or what can he do . Please call

## 2016-11-09 NOTE — Telephone Encounter (Signed)
Returned call to patient. He has found his Tikosyn bottles.

## 2016-12-07 ENCOUNTER — Telehealth: Payer: Self-pay | Admitting: Cardiology

## 2016-12-07 ENCOUNTER — Ambulatory Visit (INDEPENDENT_AMBULATORY_CARE_PROVIDER_SITE_OTHER): Payer: Medicaid Other | Admitting: *Deleted

## 2016-12-07 DIAGNOSIS — I495 Sick sinus syndrome: Secondary | ICD-10-CM

## 2016-12-07 NOTE — Telephone Encounter (Signed)
Spoke with pt and reminded pt of remote transmission that is due today. Pt verbalized understanding.   

## 2016-12-08 NOTE — Progress Notes (Signed)
Remote pacemaker transmission.   

## 2016-12-09 ENCOUNTER — Encounter: Payer: Self-pay | Admitting: Cardiology

## 2016-12-10 LAB — CUP PACEART REMOTE DEVICE CHECK
Battery Remaining Longevity: 106 mo
Battery Voltage: 2.79 V
Implantable Lead Location: 753859
Lead Channel Impedance Value: 67 Ohm
Lead Channel Sensing Intrinsic Amplitude: 1 mV
MDC IDC LEAD IMPLANT DT: 20091023
MDC IDC MSMT BATTERY IMPEDANCE: 553 Ohm
MDC IDC MSMT LEADCHNL RA IMPEDANCE VALUE: 440 Ohm
MDC IDC PG IMPLANT DT: 20091023
MDC IDC SESS DTM: 20180410045529
MDC IDC SET LEADCHNL RA PACING AMPLITUDE: 2 V
MDC IDC STAT BRADY RA PERCENT PACED: 17 %

## 2016-12-17 ENCOUNTER — Telehealth: Payer: Self-pay | Admitting: Internal Medicine

## 2016-12-17 NOTE — Telephone Encounter (Signed)
New message   Pt is calling saying he needs to speak to RN concerning a matter they spoke about earlier in the week.

## 2016-12-17 NOTE — Telephone Encounter (Signed)
Returned call to patient and discussed non medical related concerns.

## 2017-02-19 ENCOUNTER — Encounter: Payer: Self-pay | Admitting: Internal Medicine

## 2017-03-09 ENCOUNTER — Ambulatory Visit (INDEPENDENT_AMBULATORY_CARE_PROVIDER_SITE_OTHER): Payer: Medicaid Other | Admitting: Internal Medicine

## 2017-03-09 ENCOUNTER — Encounter: Payer: Self-pay | Admitting: Gastroenterology

## 2017-03-09 ENCOUNTER — Encounter (INDEPENDENT_AMBULATORY_CARE_PROVIDER_SITE_OTHER): Payer: Self-pay

## 2017-03-09 ENCOUNTER — Encounter: Payer: Self-pay | Admitting: Internal Medicine

## 2017-03-09 VITALS — BP 126/76 | HR 114 | Ht 71.0 in | Wt 270.6 lb

## 2017-03-09 DIAGNOSIS — I495 Sick sinus syndrome: Secondary | ICD-10-CM | POA: Diagnosis not present

## 2017-03-09 DIAGNOSIS — K219 Gastro-esophageal reflux disease without esophagitis: Secondary | ICD-10-CM

## 2017-03-09 DIAGNOSIS — Z950 Presence of cardiac pacemaker: Secondary | ICD-10-CM

## 2017-03-09 DIAGNOSIS — Z79899 Other long term (current) drug therapy: Secondary | ICD-10-CM | POA: Diagnosis not present

## 2017-03-09 DIAGNOSIS — I480 Paroxysmal atrial fibrillation: Secondary | ICD-10-CM | POA: Diagnosis not present

## 2017-03-09 LAB — CUP PACEART INCLINIC DEVICE CHECK
Battery Voltage: 2.79 V
Brady Statistic RA Percent Paced: 17 %
Date Time Interrogation Session: 20180710161830
Implantable Lead Implant Date: 20091023
Implantable Lead Location: 753859
Implantable Lead Model: 5076
Lead Channel Impedance Value: 67 Ohm
MDC IDC MSMT BATTERY IMPEDANCE: 577 Ohm
MDC IDC MSMT BATTERY REMAINING LONGEVITY: 104 mo
MDC IDC MSMT LEADCHNL RA IMPEDANCE VALUE: 451 Ohm
MDC IDC MSMT LEADCHNL RA SENSING INTR AMPL: 1 mV
MDC IDC PG IMPLANT DT: 20091023
MDC IDC SET LEADCHNL RA PACING AMPLITUDE: 2 V

## 2017-03-09 NOTE — Patient Instructions (Signed)
Medication Instructions: - Your physician recommends that you continue on your current medications as directed. Please refer to the Current Medication list given to you today.  Labwork: - Your physician recommends that you have lab work today: BMP/TSH/Magnesium/ CBC   Procedures/Testing: - Your physician has recommended that you have a Cardioversion (DCCV). Electrical Cardioversion uses a jolt of electricity to your heart either through paddles or wired patches attached to your chest. This is a controlled, usually prescheduled, procedure. Defibrillation is done under light anesthesia in the hospital, and you usually go home the day of the procedure. This is done to get your heart back into a normal rhythm. You are not awake for the procedure  ** [name] will call you to schedule once your lab results are back**   Follow-Up: - You have been referred to : [facility]- for acid reflux   Any Additional Special Instructions Will Be Listed Below (If Applicable).     If you need a refill on your cardiac medications before your next appointment, please call your pharmacy.

## 2017-03-09 NOTE — Progress Notes (Signed)
Patient Care Team: [facility] [place] as PCP - Seleta Rhymes, Loralee Pacas, MD (Gastroenterology)   HPI  [name] is a 56 y.o. male Seen in followup for pacemaker implanted for sinus node dysfunction and paroxysmal atrial arrhythmias which persisted despite dofetilide. He underwent catheter ablation of atrial fibrillation 2012. He had been on dofetilide stopped March 2014 subsequently resumed   He is seeing Dr. Greggory Brandy on a number of occasions to consider repeat ablation, most recently 1/18. At that time the patient again declined ablation. Dr. Nikki Dom observation was given his recurrent chest pain, Lantus be a consequence of the dyspepsia associated with Pradaxa. This was discontinued and he was started on apixoban.  There was no impact by making the switch.  He is unaware of his rapid heart rate. He feels terrible. He short of breath and feels like he is going to die. In the interim, he stopped his methimazole. He resumed it about a month ago. He also stopped his potassium. He has not resumed it.  He is again expressing interest in catheter ablation   He has a history of ischemic heart disease with prior non-STEMI possibly type II as there was  no significant obstructive lesions noted. Catheterization was done in 2006 in 2009  showing nonobstructive disease. A Myoview scan 2011 was nonischemic. He also is a history of pulmonary embolism   Echocardiogram 2012 demonstrated mild LV dysfunction 45-50%  DATE TEST    **11*/12 Echo   EF 45-50 %   8/17    Echo   EF 45-50 %   9/17 CATH Ef 45% No obstructive CAD     Past Medical History:  Diagnosis Date  . Atrial fibrillation ([place])    s/p afib ablation  PVI 5/12 JA  . CAD -nonobstructive    S/P nstemi-type II cath 05/2008 - nonobs. dzs.  . DDD (degenerative disc disease)    evaluate by neurosurgery is ongoing  . Depression    ptsd  . Erectile dysfunction   . Headache(784.0)   . Hemorrhoids   . HTN (hypertension)   .  Hyperlipemia   . Hyperthyroidism    Gra[place] dz on methimazole and followed by [name]  . Obesity   . Pancreatitis   . Polysubstance abuse    cocaine, last used 1999  . PUD (peptic ulcer disease)    gastritis due to ETOH previously  . Pulmonary embolism ([place]) 04/2003   Hx of  . Rectal bleeding   . Sinus node dysfunction ([facility])    s/p MDT PPM - AAI - V port plugged  . Sleep apnea    uses CPAP [facility] 2 YRS  . Systolic and diastolic CHF, chronic ([place])    EF 35% by most recent echo 09/19/10  . 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 ANTERIOR CERVICAL DECOMP/DISCECTOMY FUSION  12/15/2011   Procedure: ANTERIOR CERVICAL DECOMPRESSION/DISCECTOMY FUSION 1 LEVEL;  Surgeon: [name], MD;  Location: [facility];  Service: Neurosurgery;  Laterality: N/A;  Cervical Six-Seven Anterior Cervical Diskectomy fusion with Allograft and Plating  . ATRIAL ABLATION SURGERY  12/2010   afib ablation by [name]  . BACK SURGERY    . CARDIAC CATHETERIZATION     CADIAC ABLATION [name] 12/2010  . CARDIAC CATHETERIZATION N/A 05/08/2016   Procedure: Left Heart Cath and Coronary Angiography;  Surgeon: Peter M Martinique, MD;  Location: [facility];  Service: Cardiovascular;  Laterality: N/A;  . Fingers removed from right hand.  2/84   traumatic work injury  . INSERT / REPLACE / REMOVE PACEMAKER     05/2008    . LUMBAR SPINE SURGERY  02/2009   Dr. Trenton Gammon  . PACEMAKER INSERTION  10/09   by Dr [name]    Current Outpatient Prescriptions  Medication Sig Dispense Refill  . apixaban (ELIQUIS) 5 MG TABS tablet Take 1 tablet (5 mg total) by mouth 2 (two) times daily. 60 tablet 11  . carvedilol (COREG) 25 MG tablet Take 1 tablet (25 mg total) by mouth 2 (two) times daily with a meal. 180 tablet 11  . gabapentin (NEURONTIN) 100 MG capsule Take 100 mg by mouth 2 (two) times daily.     . methimazole (TAPAZOLE) 10 MG tablet Take 1 tablet  (10 mg total) by mouth daily. 30 tablet 1  . omeprazole (PRILOSEC) 40 MG capsule Take 40 mg by mouth 2 (two) times daily.     [name] oxyCODONE-acetaminophen (PERCOCET) 10-325 MG per tablet Take 1 tablet by mouth every 4 (four) hours as needed for pain. 29 tablet 0  . simvastatin (ZOCOR) 40 MG tablet TAKE 1 TABLET (40 MG TOTAL) BY MOUTH DAILY. 30 tablet 11  . TIKOSYN 500 MCG capsule TAKE 1 CAPSULE (500 MCG TOTAL) BY MOUTH 2 (TWO) TIMES DAILY. 180 capsule 2   No current facility-administered medications for this visit.     Allergies  Allergen Reactions  . Potassium Chloride     Dizziness and nausea   . Celebrex [Celecoxib] Itching  . Hydrocodone Itching  . Prednisolone Itching    Review of Systems negative except from HPI and PMH  Physical Exam BP 126/76   Pulse (!) 114   Ht 5\' 11"  (1.803 m)   Wt 270 lb 9.6 oz (122.7 kg)   SpO2 97%   BMI 37.74 kg/m  Well developed and nourished in no acute distress HENT normal Neck supple with JVP-flat Carotids brisk and full without bruits Clear Rapid and irregular rate and rhythm, no murmurs or gallops Abd-soft with active BS without hepatomegaly No Clubbing cyanosis edema Skin-warm and dry A & Oriented  Grossly normal sensory and motor function   ECG shows afib  114 -/11/33      Assessment and  Plan  Syncope   Atrial fibrillation persistent with rapid conduction   Sinus node dysfunction  High Risk Medication Surveillance  Pacemaker-Medtronic -dual-chamber with ventricular port plugged--AAI The patient's device was interrogated and the information was fully reviewed.  The device was reprogrammed to  allow recording of his atrial high rate episodes to confirm atrial fibrillation  Chronic pain-  GERD  Treated hyperthyroidism   He has again been variably compliant with his medications having discontinued his potassium and his methimazole. We will need to recheck both of these parameters prior to pursuing any further A. fib  intervention. We would like to undertake cardioversion and he thinks that he would like to consider catheter ablation again.  We'll plan DC cardioversion initially. I will then have him follow up with DC-NP in about 3 or 4 weeks to clarify whether he would like to pursue catheter ablation or not.  He would like to see GI again because of reflux problems.  He is compliant with his sleep apnea.  Because of hopefully soon to be scheduled cardioversion, I will not add rate controlling medication at this time as he has a single-chamber device.

## 2017-03-10 ENCOUNTER — Telehealth: Payer: Self-pay | Admitting: Internal Medicine

## 2017-03-10 ENCOUNTER — Encounter: Payer: Self-pay | Admitting: *Deleted

## 2017-03-10 LAB — CBC WITH DIFFERENTIAL/PLATELET
BASOS ABS: 0 10*3/uL (ref 0.0–0.2)
Basos: 0 %
EOS (ABSOLUTE): 0.2 10*3/uL (ref 0.0–0.4)
Eos: 4 %
HEMATOCRIT: 46.1 % (ref 37.5–51.0)
HEMOGLOBIN: 15.4 g/dL (ref 13.0–17.7)
Immature Grans (Abs): 0 10*3/uL (ref 0.0–0.1)
Immature Granulocytes: 0 %
LYMPHS ABS: 3.1 10*3/uL (ref 0.7–3.1)
Lymphs: 57 %
MCH: 26.7 pg (ref 26.6–33.0)
MCHC: 33.4 g/dL (ref 31.5–35.7)
MCV: 80 fL (ref 79–97)
MONOCYTES: 10 %
Monocytes Absolute: 0.6 10*3/uL (ref 0.1–0.9)
NEUTROS ABS: 1.6 10*3/uL (ref 1.4–7.0)
Neutrophils: 29 %
Platelets: 243 10*3/uL (ref 150–379)
RBC: 5.77 x10E6/uL (ref 4.14–5.80)
RDW: 17.1 % — ABNORMAL HIGH (ref 12.3–15.4)
WBC: 5.5 10*3/uL (ref 3.4–10.8)

## 2017-03-10 LAB — MAGNESIUM: MAGNESIUM: 2.1 mg/dL (ref 1.6–2.3)

## 2017-03-10 LAB — TSH: TSH: 0.277 u[IU]/mL — AB (ref 0.450–4.500)

## 2017-03-10 LAB — BASIC METABOLIC PANEL
BUN / CREAT RATIO: 12 (ref 9–20)
BUN: 14 mg/dL (ref 6–24)
CO2: 24 mmol/L (ref 20–29)
CREATININE: 1.19 mg/dL (ref 0.76–1.27)
Calcium: 9.6 mg/dL (ref 8.7–10.2)
Chloride: 104 mmol/L (ref 96–106)
GFR calc non Af Amer: 67 mL/min/{1.73_m2} (ref >59)
GFR, EST AFRICAN AMERICAN: 78 mL/min/{1.73_m2} (ref >59)
GLUCOSE: 87 mg/dL (ref 65–99)
Potassium: 4.5 mmol/L (ref 3.5–5.2)
SODIUM: 142 mmol/L (ref 134–144)

## 2017-03-10 NOTE — Telephone Encounter (Signed)
Patient's lab results reviewed with Dr. [name] this morning. Per Dr. [name]- ok to proceed with DCCV.  I spoke with the patient and made him aware of his lab results and that Dr. [name] would like me to go ahead and schedule him for his DCCV. The patient is agreeable and states next available is fine.  Called [facility] scheduling and DCCV is scheduled for Wednesday 03/17/17 at 1:00 pm with Dr. Marlou Porch. I attempted to call the patient back to confirm and this went to unidentified voice mail.  I left a message for the patient to call back.

## 2017-03-11 NOTE — Telephone Encounter (Signed)
I called and spoke with the patient.  He is aware of his DCCV scheduled on Wednesday 03/17/17 at [facility]. - aware to arrive at 11:30 am at [facility] - aware NPO after midnight - aware that he may take his medications the morning of his procedure with enough water to get them down safely - aware not to miss any doses of Eliquis (he states he has not missed any doses) - aware to have someone drive him home - aware to bring insurance cards and med list to the hospital  Patient without questions at this time. Letter of instructions also mailed to the patient.

## 2017-03-15 ENCOUNTER — Telehealth: Payer: Self-pay | Admitting: *Deleted

## 2017-03-15 ENCOUNTER — Telehealth: Payer: Self-pay | Admitting: Cardiology

## 2017-03-15 ENCOUNTER — Encounter: Payer: Medicaid Other | Admitting: *Deleted

## 2017-03-15 NOTE — Telephone Encounter (Signed)
Spoke with pt and reminded pt of remote transmission that is due today. Pt verbalized understanding.   

## 2017-03-15 NOTE — Telephone Encounter (Signed)
-----   Message from Deboraha Sprang, MD sent at 03/14/2017  3:07 PM EDT ----- Please Inform Patient that labs are normal   Thanks

## 2017-03-15 NOTE — Telephone Encounter (Signed)
Pt has been notified of lab results by phone with verbal understanding. Pt thanked me for my call today.   

## 2017-03-17 ENCOUNTER — Encounter (HOSPITAL_COMMUNITY): Payer: Self-pay | Admitting: Certified Registered Nurse Anesthetist

## 2017-03-17 ENCOUNTER — Encounter (HOSPITAL_COMMUNITY)
Admission: RE | Disposition: A | Discharge status: Home / Self Care | Payer: Self-pay | Source: Ambulatory Visit | Attending: Cardiology

## 2017-03-17 ENCOUNTER — Ambulatory Visit (HOSPITAL_COMMUNITY)
Admission: RE | Admit: 2017-03-17 | Discharge: 2017-03-17 | Disposition: A | Discharge status: Home / Self Care | Payer: Medicaid Other | Source: Ambulatory Visit | Attending: Cardiology | Admitting: Cardiology

## 2017-03-17 DIAGNOSIS — I4891 Unspecified atrial fibrillation: Secondary | ICD-10-CM | POA: Diagnosis present

## 2017-03-17 DIAGNOSIS — I509 Heart failure, unspecified: Secondary | ICD-10-CM | POA: Diagnosis not present

## 2017-03-17 DIAGNOSIS — I2510 Atherosclerotic heart disease of native coronary artery without angina pectoris: Secondary | ICD-10-CM | POA: Diagnosis not present

## 2017-03-17 DIAGNOSIS — K219 Gastro-esophageal reflux disease without esophagitis: Secondary | ICD-10-CM | POA: Diagnosis not present

## 2017-03-17 DIAGNOSIS — F329 Major depressive disorder, single episode, unspecified: Secondary | ICD-10-CM | POA: Insufficient documentation

## 2017-03-17 DIAGNOSIS — Z538 Procedure and treatment not carried out for other reasons: Secondary | ICD-10-CM | POA: Diagnosis not present

## 2017-03-17 DIAGNOSIS — I10 Essential (primary) hypertension: Secondary | ICD-10-CM | POA: Diagnosis not present

## 2017-03-17 DIAGNOSIS — Z87891 Personal history of nicotine dependence: Secondary | ICD-10-CM | POA: Diagnosis not present

## 2017-03-17 DIAGNOSIS — I110 Hypertensive heart disease with heart failure: Secondary | ICD-10-CM | POA: Insufficient documentation

## 2017-03-17 DIAGNOSIS — K279 Peptic ulcer, site unspecified, unspecified as acute or chronic, without hemorrhage or perforation: Secondary | ICD-10-CM | POA: Insufficient documentation

## 2017-03-17 DIAGNOSIS — G4730 Sleep apnea, unspecified: Secondary | ICD-10-CM | POA: Insufficient documentation

## 2017-03-17 SURGERY — CANCELLED PROCEDURE

## 2017-03-17 NOTE — Progress Notes (Signed)
Patient arrived to endoscopy for outpatient cardioversion today, sinus bradycardia on the monitor. [employer] rep called to beside for pacemaker interrogation, confirms same. Dr. Marlou Porch notified, verbal order received to cancel cardioversion for patient self conversion. Patient discharged to home.

## 2017-03-17 NOTE — Anesthesia Preprocedure Evaluation (Deleted)
Anesthesia Evaluation  Patient identified by MRN, date of birth, ID band Patient awake    Reviewed: Allergy & Precautions, H&P , NPO status , Patient's Chart, lab work & pertinent test results  Airway        Dental no notable dental hx.    Pulmonary neg pulmonary ROS, sleep apnea and Continuous Positive Airway Pressure Ventilation , former smoker,    Pulmonary exam normal        Cardiovascular hypertension, Pt. on medications + CAD and +CHF  negative cardio ROS  + dysrhythmias Atrial Fibrillation      Neuro/Psych  Headaches, Depression negative neurological ROS     GI/Hepatic Neg liver ROS, PUD, GERD  Medicated and Controlled,  Endo/Other  negative endocrine ROSHyperthyroidism Morbid obesity  Renal/GU negative Renal ROS  negative genitourinary   Musculoskeletal  (+) Arthritis , Osteoarthritis,    Abdominal   Peds  Hematology negative hematology ROS (+)   Anesthesia Other Findings   Reproductive/Obstetrics negative OB ROS                             Anesthesia Physical Anesthesia Plan  ASA: III  Anesthesia Plan: General   Post-op Pain Management:    Induction: Intravenous  PONV Risk Score and Plan: 2 and Treatment may vary due to age or medical condition  Airway Management Planned: Mask  Additional Equipment:   Intra-op Plan:   Post-operative Plan:   Informed Consent: I have reviewed the patients History and Physical, chart, labs and discussed the procedure including the risks, benefits and alternatives for the proposed anesthesia with the patient or authorized representative who has indicated his/her understanding and acceptance.   Dental advisory given  Plan Discussed with: CRNA  Anesthesia Plan Comments:         Anesthesia Quick Evaluation

## 2017-03-18 ENCOUNTER — Encounter: Payer: Self-pay | Admitting: Cardiology

## 2017-03-31 ENCOUNTER — Ambulatory Visit (INDEPENDENT_AMBULATORY_CARE_PROVIDER_SITE_OTHER): Payer: Medicaid Other | Admitting: *Deleted

## 2017-03-31 DIAGNOSIS — I495 Sick sinus syndrome: Secondary | ICD-10-CM | POA: Diagnosis not present

## 2017-04-01 ENCOUNTER — Telehealth: Payer: Self-pay | Admitting: Internal Medicine

## 2017-04-01 NOTE — Telephone Encounter (Signed)
Close this encounter

## 2017-04-01 NOTE — Telephone Encounter (Signed)
Pt's medication was sent to pt's pharmacy as requested. Confirmation received.  °

## 2017-04-01 NOTE — Telephone Encounter (Signed)
Follow up   Pt is returning call    

## 2017-04-01 NOTE — Progress Notes (Signed)
Remote pacemaker transmission.   

## 2017-04-02 LAB — CUP PACEART REMOTE DEVICE CHECK
Battery Impedance: 627 Ohm
Implantable Lead Location: 753859
Implantable Pulse Generator Implant Date: 20091023
Lead Channel Impedance Value: 469 Ohm
Lead Channel Setting Pacing Amplitude: 2 V
MDC IDC LEAD IMPLANT DT: 20091023
MDC IDC MSMT BATTERY REMAINING LONGEVITY: 101 mo
MDC IDC MSMT BATTERY VOLTAGE: 2.78 V
MDC IDC MSMT LEADCHNL RA SENSING INTR AMPL: 1.4 mV
MDC IDC MSMT LEADCHNL RV IMPEDANCE VALUE: 67 Ohm
MDC IDC SESS DTM: 20180801225101
MDC IDC STAT BRADY RA PERCENT PACED: 23 %

## 2017-04-03 ENCOUNTER — Inpatient Hospital Stay (HOSPITAL_COMMUNITY)
Admission: EM | Admit: 2017-04-03 | Discharge: 2017-04-04 | DRG: 293 | Disposition: A | Discharge status: Home / Self Care | Payer: Medicaid Other | Attending: Cardiovascular Disease | Admitting: Cardiovascular Disease

## 2017-04-03 ENCOUNTER — Encounter (HOSPITAL_COMMUNITY): Payer: Self-pay

## 2017-04-03 DIAGNOSIS — E785 Hyperlipidemia, unspecified: Secondary | ICD-10-CM | POA: Diagnosis present

## 2017-04-03 DIAGNOSIS — Z803 Family history of malignant neoplasm of breast: Secondary | ICD-10-CM | POA: Diagnosis not present

## 2017-04-03 DIAGNOSIS — Z981 Arthrodesis status: Secondary | ICD-10-CM | POA: Diagnosis not present

## 2017-04-03 DIAGNOSIS — I110 Hypertensive heart disease with heart failure: Secondary | ICD-10-CM | POA: Diagnosis present

## 2017-04-03 DIAGNOSIS — Z7901 Long term (current) use of anticoagulants: Secondary | ICD-10-CM | POA: Diagnosis not present

## 2017-04-03 DIAGNOSIS — I5033 Acute on chronic diastolic (congestive) heart failure: Secondary | ICD-10-CM | POA: Diagnosis not present

## 2017-04-03 DIAGNOSIS — Z888 Allergy status to other drugs, medicaments and biological substances status: Secondary | ICD-10-CM

## 2017-04-03 DIAGNOSIS — Z87891 Personal history of nicotine dependence: Secondary | ICD-10-CM

## 2017-04-03 DIAGNOSIS — R0602 Shortness of breath: Secondary | ICD-10-CM | POA: Diagnosis not present

## 2017-04-03 DIAGNOSIS — Z86711 Personal history of pulmonary embolism: Secondary | ICD-10-CM | POA: Diagnosis not present

## 2017-04-03 DIAGNOSIS — I10 Essential (primary) hypertension: Secondary | ICD-10-CM | POA: Diagnosis not present

## 2017-04-03 DIAGNOSIS — F4310 Post-traumatic stress disorder, unspecified: Secondary | ICD-10-CM | POA: Diagnosis present

## 2017-04-03 DIAGNOSIS — Z8711 Personal history of peptic ulcer disease: Secondary | ICD-10-CM

## 2017-04-03 DIAGNOSIS — I480 Paroxysmal atrial fibrillation: Secondary | ICD-10-CM | POA: Diagnosis present

## 2017-04-03 DIAGNOSIS — E0500 Thyrotoxicosis with diffuse goiter without thyrotoxic crisis or storm: Secondary | ICD-10-CM | POA: Diagnosis present

## 2017-04-03 DIAGNOSIS — Z7982 Long term (current) use of aspirin: Secondary | ICD-10-CM

## 2017-04-03 DIAGNOSIS — I4891 Unspecified atrial fibrillation: Secondary | ICD-10-CM | POA: Diagnosis not present

## 2017-04-03 DIAGNOSIS — Z801 Family history of malignant neoplasm of trachea, bronchus and lung: Secondary | ICD-10-CM | POA: Diagnosis not present

## 2017-04-03 DIAGNOSIS — I2510 Atherosclerotic heart disease of native coronary artery without angina pectoris: Secondary | ICD-10-CM | POA: Diagnosis present

## 2017-04-03 DIAGNOSIS — I5020 Unspecified systolic (congestive) heart failure: Secondary | ICD-10-CM | POA: Diagnosis present

## 2017-04-03 DIAGNOSIS — I5043 Acute on chronic combined systolic (congestive) and diastolic (congestive) heart failure: Secondary | ICD-10-CM | POA: Diagnosis present

## 2017-04-03 DIAGNOSIS — G4730 Sleep apnea, unspecified: Secondary | ICD-10-CM | POA: Diagnosis present

## 2017-04-03 DIAGNOSIS — I252 Old myocardial infarction: Secondary | ICD-10-CM | POA: Diagnosis not present

## 2017-04-03 DIAGNOSIS — Z8249 Family history of ischemic heart disease and other diseases of the circulatory system: Secondary | ICD-10-CM

## 2017-04-03 DIAGNOSIS — Z950 Presence of cardiac pacemaker: Secondary | ICD-10-CM | POA: Diagnosis not present

## 2017-04-03 LAB — URINALYSIS, ROUTINE W REFLEX MICROSCOPIC
BACTERIA UA: NONE SEEN
Bilirubin Urine: NEGATIVE
Glucose, UA: NEGATIVE mg/dL
Ketones, ur: NEGATIVE mg/dL
Leukocytes, UA: NEGATIVE
Nitrite: NEGATIVE
PROTEIN: NEGATIVE mg/dL
Specific Gravity, Urine: 1.012 (ref 1.005–1.030)
pH: 6 (ref 5.0–8.0)

## 2017-04-03 LAB — CBC
HEMATOCRIT: 45.5 % (ref 39.0–52.0)
HEMOGLOBIN: 15.6 g/dL (ref 13.0–17.0)
MCH: 26.8 pg (ref 26.0–34.0)
MCHC: 34.3 g/dL (ref 30.0–36.0)
MCV: 78 fL (ref 78.0–100.0)
Platelets: 230 10*3/uL (ref 150–400)
RBC: 5.83 MIL/uL — ABNORMAL HIGH (ref 4.22–5.81)
RDW: 16.2 % — ABNORMAL HIGH (ref 11.5–15.5)
WBC: 5.3 10*3/uL (ref 4.0–10.5)

## 2017-04-03 LAB — BASIC METABOLIC PANEL
ANION GAP: 7 (ref 5–15)
BUN: 14 mg/dL (ref 6–20)
CALCIUM: 9.3 mg/dL (ref 8.9–10.3)
CO2: 24 mmol/L (ref 22–32)
Chloride: 108 mmol/L (ref 101–111)
Creatinine, Ser: 1.11 mg/dL (ref 0.61–1.24)
GFR calc Af Amer: >60 mL/min (ref >60)
GFR calc non Af Amer: >60 mL/min (ref >60)
GLUCOSE: 81 mg/dL (ref 65–99)
Potassium: 4.1 mmol/L (ref 3.5–5.1)
SODIUM: 139 mmol/L (ref 135–145)

## 2017-04-03 LAB — I-STAT TROPONIN, ED: TROPONIN I, POC: 0.02 ng/mL (ref 0.00–0.08)

## 2017-04-03 LAB — BRAIN NATRIURETIC PEPTIDE: B Natriuretic Peptide: 208 pg/mL — ABNORMAL HIGH (ref 0.0–100.0)

## 2017-04-03 LAB — MAGNESIUM: Magnesium: 2 mg/dL (ref 1.7–2.4)

## 2017-04-03 LAB — TSH: TSH: 0.297 u[IU]/mL — ABNORMAL LOW (ref 0.350–4.500)

## 2017-04-03 LAB — CBG MONITORING, ED: GLUCOSE-CAPILLARY: 114 mg/dL — AB (ref 65–99)

## 2017-04-03 MED ORDER — CARVEDILOL 25 MG PO TABS
25.0000 mg | ORAL_TABLET | Freq: Two times a day (BID) | ORAL | Status: DC
  Administered 2017-04-04: 25 mg via ORAL
  Filled 2017-04-03: qty 1

## 2017-04-03 MED ORDER — METHIMAZOLE 10 MG PO TABS
10.0000 mg | ORAL_TABLET | Freq: Every day | ORAL | Status: DC
  Administered 2017-04-04: 10 mg via ORAL
  Filled 2017-04-03: qty 1

## 2017-04-03 MED ORDER — FUROSEMIDE 10 MG/ML IJ SOLN
40.0000 mg | Freq: Once | INTRAMUSCULAR | Status: AC
  Administered 2017-04-03: 40 mg via INTRAVENOUS
  Filled 2017-04-03: qty 4

## 2017-04-03 MED ORDER — SIMVASTATIN 40 MG PO TABS: 40.0000 mg | ORAL_TABLET | Freq: Every day | ORAL | Status: DC

## 2017-04-03 MED ORDER — ASPIRIN EC 81 MG PO TBEC
81.0000 mg | DELAYED_RELEASE_TABLET | Freq: Every day | ORAL | Status: DC
  Administered 2017-04-04: 81 mg via ORAL
  Filled 2017-04-03: qty 1

## 2017-04-03 MED ORDER — DILTIAZEM HCL 25 MG/5ML IV SOLN
10.0000 mg | Freq: Once | INTRAVENOUS | Status: AC
  Administered 2017-04-03: 10 mg via INTRAVENOUS
  Filled 2017-04-03: qty 5

## 2017-04-03 MED ORDER — FUROSEMIDE 10 MG/ML IJ SOLN
40.0000 mg | Freq: Four times a day (QID) | INTRAMUSCULAR | Status: AC
  Administered 2017-04-04 (×2): 40 mg via INTRAVENOUS
  Filled 2017-04-03 (×2): qty 4

## 2017-04-03 MED ORDER — PANTOPRAZOLE SODIUM 40 MG PO TBEC: 40.0000 mg | DELAYED_RELEASE_TABLET | Freq: Every day | ORAL | Status: DC

## 2017-04-03 MED ORDER — DOFETILIDE 500 MCG PO CAPS
500.0000 ug | ORAL_CAPSULE | Freq: Two times a day (BID) | ORAL | Status: DC
  Administered 2017-04-04 (×2): 500 ug via ORAL
  Filled 2017-04-03 (×2): qty 1

## 2017-04-03 MED ORDER — GABAPENTIN 100 MG PO CAPS
100.0000 mg | ORAL_CAPSULE | Freq: Two times a day (BID) | ORAL | Status: DC
  Administered 2017-04-04 (×2): 100 mg via ORAL
  Filled 2017-04-03 (×2): qty 1

## 2017-04-03 MED ORDER — OXYCODONE-ACETAMINOPHEN 10-325 MG PO TABS: 1.0000 | ORAL_TABLET | ORAL | Status: DC | PRN

## 2017-04-03 NOTE — ED Notes (Addendum)
Attempted to call report x1. [id number] states that they will call back for report.

## 2017-04-03 NOTE — ED Notes (Signed)
[employer] Representative called to inform that testing results would be faxed and that pt currently has a dual chamber pacemaker but is currently set as pacing the atria. Also there were over 900 high rates in the atria since last check and there were atrial arrhythmias present on exam. Dr.Issacs notified of telephone results and that fax results would be coming in.

## 2017-04-03 NOTE — ED Notes (Signed)
Faxed results from [employer] for pacemaker interrogation given to Ellender Hose, MD.

## 2017-04-03 NOTE — ED Triage Notes (Signed)
Pt states he has hx of a-fib.  Can feel when he goes in and out.  Today has been weak and dizzy.

## 2017-04-03 NOTE — ED Notes (Signed)
Bed: WA15 Expected date:  Expected time:  Means of arrival:  Comments: 

## 2017-04-03 NOTE — ED Provider Notes (Signed)
[facility] DEPT Provider Note   CSN: [id number] Arrival date & time: 04/03/17  3903     History   Chief Complaint Chief Complaint  Patient presents with  . Dizziness    HPI [name] is a 56 y.o. male.  HPI   56 year old male with history of paroxysmal atrial fibrillation, coronary artery disease, who presents with palpitations. The patient states that over the last week, he has noticed progressively worsening bouts of atrial fibrillation. He states when he goes into A. fib, he experiences nausea, shortness of breath, and generalized fatigue. Usually these episodes last less than one hour but earlier today, he awoke with these symptoms and have persisted throughout the day. He had difficulty getting around the house to this weakness as well as shortness of breath. He tried to do yard work outside and was unable to do so so has been in bed all day. Denies any associated chest pain. Symptoms feel similar to his previous episodes of atrial fibrillation. He states he has not missed any of his blood thinners as well as his tikosyn.  Past Medical History:  Diagnosis Date  . Atrial fibrillation ([place])    s/p afib ablation  PVI 5/12 JA  . CAD -nonobstructive    S/P nstemi-type II cath 05/2008 - nonobs. dzs.  . DDD (degenerative disc disease)    evaluate by neurosurgery is ongoing  . Depression    ptsd  . Erectile dysfunction   . Headache(784.0)   . Hemorrhoids   . HTN (hypertension)   . Hyperlipemia   . Hyperthyroidism    Graves dz on methimazole and followed by [name]  . Obesity   . Pancreatitis   . Polysubstance abuse    cocaine, last used 1999  . PUD (peptic ulcer disease)    gastritis due to ETOH previously  . Pulmonary embolism ([place]) 04/2003   Hx of  . Rectal bleeding   . Sinus node dysfunction (HCC)    s/p MDT PPM - AAI - V port plugged  . Sleep apnea    uses CPAP [facility] 2 YRS  . Systolic and diastolic CHF, chronic ([place])    EF 35% by most recent  echo 09/19/10  . 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   recent ly had tooth pulled painful    Patient Active Problem List   Diagnosis Date Noted  . Acute on chronic diastolic congestive heart failure, NYHA class 4 ([place]) 04/03/2017  . Back pain 11/12/2015  . Scrotal varicose veins 07/11/2015  . High risk medication use - Tikosyn 09/16/2014  . Chronic anticoagulation-Pradaxa 09/13/2014  . Muscle twitch 09/11/2014  . Cramps of lower extremity 09/11/2014  . Irritant contact dermatitis 10/03/2013  . Eunuchoidism 10/03/2013  . Mixed, or nondependent drug abuse, in remission 09/13/2013  . Spinal stenosis, lumbar region, with neurogenic claudication 05/23/2013  . CAD -nonobstructive   . Preoperative cardiovascular examination 11/17/2012  . Pulmonary embolism ([employer] [facility]) 09/02/2012  . Acid reflux 09/02/2012  . Adiposity 02/12/2012  . Cervical disc herniation 12/15/2011  . Allergic rhinitis 09/21/2011  . Atrial fibrillation ([place]) 07/08/2011  . Discomfort 06/19/2011  . ED (erectile dysfunction) of organic origin 06/19/2011  . Lichen planus 00/92/3300  . Decreased libido 06/19/2011  . Essential (primary) hypertension 04/14/2011  . Dermatitis, eczematoid 02/26/2011  . 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  . Degeneration of intervertebral disc of lumbosacral region 01/06/2011  . Chronic atrial fibrillation ([place]) 01/06/2011  . GRAVES' DISEASE 10/30/2010  . CARPAL TUNNEL SYNDROME, LEFT 10/16/2010  .  SICK SINUS/ TACHY-BRADY SYNDROME 10/16/2010  . Cardiomyopathy, ischemic 09/21/2010  . HLD (hyperlipidemia) 09/16/2010  . PPM-Medtronic- 2009 06/27/2010  . Abnormal cardiac CT angiography 06/06/2010  . Seasonal and perennial allergic rhinitis 02/05/2010  . OTHER CHEST PAIN 11/27/2009  . ERECTILE DYSFUNCTION, ORGANIC 07/18/2009  . Obstructive sleep apnea- on C-pap 07/08/2009  . COMBINED HEART FAILURE, CHRONIC 05/30/2009  . LUMBAR RADICULOPATHY, RIGHT 11/05/2008  . HIATAL HERNIA 07/04/2008  . HYPERTHYROIDISM 08/30/2007  .  Dyslipidemia 08/30/2007  . ALCOHOL ABUSE 08/30/2007  . DEPRESSION 08/30/2007  . PAF (paroxysmal atrial fibrillation) ([place]) 08/30/2007  . GERD 08/30/2007  . PULMONARY EMBOLISM, HX OF 08/30/2007  . DRUG ABUSE, HX OF 08/30/2007    Past Surgical History:  Procedure Laterality Date  . ANTERIOR CERVICAL DECOMP/DISCECTOMY FUSION  12/15/2011   Procedure: ANTERIOR CERVICAL DECOMPRESSION/DISCECTOMY FUSION 1 LEVEL;  Surgeon: [name], MD;  Location: [facility];  Service: Neurosurgery;  Laterality: N/A;  Cervical Six-Seven Anterior Cervical Diskectomy fusion with Allograft and Plating  . ATRIAL ABLATION SURGERY  12/2010   afib ablation by [name]  . BACK SURGERY    . CARDIAC CATHETERIZATION     CADIAC ABLATION [name] 12/2010  . CARDIAC CATHETERIZATION N/A 05/08/2016   Procedure: Left Heart Cath and Coronary Angiography;  Surgeon: Peter M Martinique, MD;  Location: [facility];  Service: Cardiovascular;  Laterality: N/A;  . Fingers removed from right hand.  2/84   traumatic work injury  . INSERT / REPLACE / REMOVE PACEMAKER     05/2008    . LUMBAR SPINE SURGERY  02/2009   Dr. Trenton Gammon  . PACEMAKER INSERTION  10/09   by Dr [name]       Home Medications    Prior to Admission medications   Medication Sig Start Date End Date Taking? Authorizing Provider  apixaban (ELIQUIS) 5 MG TABS tablet Take 1 tablet (5 mg total) by mouth 2 (two) times daily. 09/07/16  Yes Allred, Jeneen Rinks, MD  carvedilol (COREG) 25 MG tablet Take 1 tablet (25 mg total) by mouth 2 (two) times daily with a meal. 05/06/16  Yes Allred, Jeneen Rinks, MD  clobetasol cream (TEMOVATE) 9.56 % Apply 1 application topically 2 (two) times daily.   Yes [provider]  desonide (DESOWEN) 0.05 % cream Apply 1 application topically 2 (two) times daily.   Yes [provider]  dofetilide (TIKOSYN) 500 MCG capsule TAKE 1 CAPSULE (500 MCG TOTAL) BY MOUTH 2 (TWO) TIMES DAILY. 04/01/17  Yes Deboraha Sprang, MD  gabapentin (NEURONTIN)  100 MG capsule Take 100 mg by mouth 2 (two) times daily.    Yes [provider]  methimazole (TAPAZOLE) 10 MG tablet Take 1 tablet (10 mg total) by mouth daily. 11/06/14  Yes Renato Shin, MD  omeprazole (PRILOSEC) 40 MG capsule Take 40 mg by mouth 2 (two) times daily.    Yes [provider]  oxyCODONE-acetaminophen (PERCOCET) 10-325 MG per tablet Take 1 tablet by mouth every 4 (four) hours as needed for pain. 05/29/13  Yes Antonietta Breach, PA-C  simvastatin (ZOCOR) 40 MG tablet TAKE 1 TABLET (40 MG TOTAL) BY MOUTH DAILY. 11/28/14  Yes Nahser, Wonda Cheng, MD  triamcinolone ointment (KENALOG) 0.1 % Apply 1 application topically 2 (two) times daily as needed for itching. 02/24/17  Yes [provider]    Family History Family History  Problem Relation Age of Onset  . Heart disease Mother   . Breast cancer Mother   . Lung cancer Father   .  Diabetes Unknown   . Colon cancer Neg Hx     Social History Social History  Substance Use Topics  . Smoking status: Former Smoker    Packs/day: 1.00    Years: 15.00    Types: Cigarettes    Quit date: 04/04/2003  . Smokeless tobacco: Never Used  . Alcohol use No     Comment: formerly heavy ETOH,  now drinks a fifth of liquor every 2 weeks     Allergies   Potassium chloride   Review of Systems Review of Systems  Constitutional: Positive for fatigue. Negative for chills and fever.  HENT: Negative for congestion and rhinorrhea.   Eyes: Negative for visual disturbance.  Respiratory: Positive for shortness of breath. Negative for cough and wheezing.   Cardiovascular: Positive for palpitations. Negative for chest pain and leg swelling.  Gastrointestinal: Negative for abdominal pain, diarrhea, nausea and vomiting.  Genitourinary: Negative for dysuria and flank pain.  Musculoskeletal: Negative for neck pain and neck stiffness.  Skin: Negative for rash and wound.  Allergic/Immunologic: Negative for immunocompromised state.    Neurological: Positive for weakness. Negative for syncope and headaches.  All other systems reviewed and are negative.    Physical Exam Updated Vital Signs BP 134/86 (BP Location: Left Arm)   Pulse 61   Temp 97.6 F (36.4 C) (Oral)   Resp 18   Ht 5\' 11"  (1.803 m)   Wt 120.1 kg (264 lb 12.8 oz)   SpO2 96%   BMI 36.93 kg/m   Physical Exam  Constitutional: He is oriented to person, place, and time. He appears well-developed and well-nourished. No distress.  HENT:  Head: Normocephalic and atraumatic.  Eyes: Conjunctivae are normal.  Neck: Neck supple.  Cardiovascular: Normal heart sounds.  An irregularly irregular rhythm present. Tachycardia present.  Exam reveals no friction rub.   No murmur heard. Pulmonary/Chest: Effort normal and breath sounds normal. No respiratory distress. He has no wheezes. He has no rales.  Abdominal: He exhibits no distension.  Musculoskeletal: He exhibits no edema.  Neurological: He is alert and oriented to person, place, and time. He exhibits normal muscle tone.  Skin: Skin is warm. Capillary refill takes less than 2 seconds.  Psychiatric: He has a normal mood and affect.  Nursing note and vitals reviewed.    ED Treatments / Results  Labs (all labs ordered are listed, but only abnormal results are displayed) Labs Reviewed  CBC - Abnormal; Notable for the following:       Result Value   RBC 5.83 (*)    RDW 16.2 (*)    All other components within normal limits  URINALYSIS, ROUTINE W REFLEX MICROSCOPIC - Abnormal; Notable for the following:    Hgb urine dipstick MODERATE (*)    Squamous Epithelial / LPF 0-5 (*)    All other components within normal limits  BRAIN NATRIURETIC PEPTIDE - Abnormal; Notable for the following:    B Natriuretic Peptide 208.0 (*)    All other components within normal limits  TSH - Abnormal; Notable for the following:    TSH 0.297 (*)    All other components within normal limits  BRAIN NATRIURETIC PEPTIDE -  Abnormal; Notable for the following:    B Natriuretic Peptide 280.5 (*)    All other components within normal limits  CBG MONITORING, ED - Abnormal; Notable for the following:    Glucose-Capillary 114 (*)    All other components within normal limits  BASIC METABOLIC PANEL  MAGNESIUM  TSH  TROPONIN I  T4, FREE  HIV ANTIBODY (ROUTINE TESTING)  TROPONIN I  TROPONIN I  HEMOGLOBIN A1C  COMPREHENSIVE METABOLIC PANEL  CBC  PROTIME-INR  HEPARIN LEVEL (UNFRACTIONATED)  APTT  I-STAT TROPONIN, ED    EKG  EKG Interpretation  Date/Time:  Saturday April 03 2017 18:17:04 EDT Ventricular Rate:  126 PR Interval:    QRS Duration: 107 QT Interval:  346 QTC Calculation: 501 R Axis:   -45 Text Interpretation:  Atrial fibrillation Ventricular tachycardia, unsustained LAD, consider left anterior fascicular block Abnormal R-wave progression, late transition Minimal ST depression, lateral leads Prolonged QT interval Since last EKG, there is more ventricular ectopy Confirmed by Duffy Bruce [phone]) on 04/03/2017 6:36:57 PM       Radiology No results found.  Procedures Procedures (including critical care time)  Medications Ordered in ED Medications  gabapentin (NEURONTIN) capsule 100 mg (100 mg Oral Given 04/04/17 0109)  methimazole (TAPAZOLE) tablet 10 mg (not administered)  simvastatin (ZOCOR) tablet 40 mg (not administered)  carvedilol (COREG) tablet 25 mg (not administered)  dofetilide (TIKOSYN) capsule 500 mcg (500 mcg Oral Given 04/04/17 0110)  aspirin EC tablet 81 mg (not administered)  furosemide (LASIX) injection 40 mg (40 mg Intravenous Given 04/04/17 0108)  oxyCODONE-acetaminophen (PERCOCET/ROXICET) 5-325 MG per tablet 1 tablet (not administered)    And  oxyCODONE (Oxy IR/ROXICODONE) immediate release tablet 5 mg (not administered)  omeprazole (PRILOSEC) capsule 40 mg (40 mg Oral Given 04/04/17 0124)  heparin ADULT infusion 100 units/mL (25000 units/25mL sodium chloride 0.45%)  (1,500 Units/hr Intravenous New Bag/Given 04/04/17 0109)  diltiazem (CARDIZEM) injection 10 mg (10 mg Intravenous Given 04/03/17 2213)  furosemide (LASIX) injection 40 mg (40 mg Intravenous Given 04/03/17 2215)     Initial Impression / Assessment and Plan / ED Course  I have reviewed the triage vital signs and the nursing notes.  Pertinent labs & imaging results that were available during my care of the patient were reviewed by me and considered in my medical decision making (see chart for details).     56 year old male with paroxysmal atrial fibrillation here with recurrent, increasing severity and frequency of atrial fibrillation with increasing rate. Interrogation of his pacemaker shows progressively worsening frequency of age for fibrillation episodes and atrial tachycardia. His lab work today is overall reassuring with exception of mild BNP elevation, which may suggest component of CHF. I'm hesitant to cardiovert the patient as I suspect he will medial return given his complex history of recurrent atrial fibrillation and recent ablation. I discussed with cardiology, who will admit the patient for diuresis, rate control, and possible cardioversion in the morning.  This note was prepared with assistance of Systems analyst. Occasional wrong-word or sound-a-like substitutions may have occurred due to the inherent limitations of voice recognition software.  Final Clinical Impressions(s) / ED Diagnoses   Final diagnoses:  Acute on chronic diastolic heart failure ([place])  Paroxysmal atrial fibrillation [facility])    New Prescriptions Current Discharge Medication List       Duffy Bruce, MD 04/04/17 0205

## 2017-04-04 DIAGNOSIS — R0602 Shortness of breath: Secondary | ICD-10-CM

## 2017-04-04 DIAGNOSIS — I10 Essential (primary) hypertension: Secondary | ICD-10-CM

## 2017-04-04 DIAGNOSIS — I4891 Unspecified atrial fibrillation: Secondary | ICD-10-CM

## 2017-04-04 DIAGNOSIS — I5033 Acute on chronic diastolic (congestive) heart failure: Secondary | ICD-10-CM

## 2017-04-04 LAB — COMPREHENSIVE METABOLIC PANEL
ALBUMIN: 4.2 g/dL (ref 3.5–5.0)
ALK PHOS: 75 U/L (ref 38–126)
ALT: 23 U/L (ref 17–63)
AST: 26 U/L (ref 15–41)
Anion gap: 10 (ref 5–15)
BILIRUBIN TOTAL: 0.9 mg/dL (ref 0.3–1.2)
BUN: 11 mg/dL (ref 6–20)
CALCIUM: 9.9 mg/dL (ref 8.9–10.3)
CO2: 26 mmol/L (ref 22–32)
CREATININE: 1.28 mg/dL — AB (ref 0.61–1.24)
Chloride: 102 mmol/L (ref 101–111)
GFR calc Af Amer: >60 mL/min (ref >60)
GFR calc non Af Amer: >60 mL/min (ref >60)
GLUCOSE: 102 mg/dL — AB (ref 65–99)
Potassium: 3.6 mmol/L (ref 3.5–5.1)
Sodium: 138 mmol/L (ref 135–145)
TOTAL PROTEIN: 7.5 g/dL (ref 6.5–8.1)

## 2017-04-04 LAB — TROPONIN I
Troponin I: <0.03 ng/mL (ref <0.03)
Troponin I: <0.03 ng/mL (ref <0.03)

## 2017-04-04 LAB — CBC
HCT: 49.3 % (ref 39.0–52.0)
Hemoglobin: 16.8 g/dL (ref 13.0–17.0)
MCH: 26.8 pg (ref 26.0–34.0)
MCHC: 34.1 g/dL (ref 30.0–36.0)
MCV: 78.5 fL (ref 78.0–100.0)
PLATELETS: 249 10*3/uL (ref 150–400)
RBC: 6.28 MIL/uL — ABNORMAL HIGH (ref 4.22–5.81)
RDW: 16.2 % — AB (ref 11.5–15.5)
WBC: 6.1 10*3/uL (ref 4.0–10.5)

## 2017-04-04 LAB — PROTIME-INR
INR: 1.04
PROTHROMBIN TIME: 13.7 s (ref 11.4–15.2)

## 2017-04-04 LAB — T4, FREE: Free T4: 0.87 ng/dL (ref 0.61–1.12)

## 2017-04-04 LAB — HEPARIN LEVEL (UNFRACTIONATED): Heparin Unfractionated: 1.07 IU/mL — ABNORMAL HIGH (ref 0.30–0.70)

## 2017-04-04 LAB — GLUCOSE, CAPILLARY: Glucose-Capillary: 108 mg/dL — ABNORMAL HIGH (ref 65–99)

## 2017-04-04 LAB — BRAIN NATRIURETIC PEPTIDE: B Natriuretic Peptide: 280.5 pg/mL — ABNORMAL HIGH (ref 0.0–100.0)

## 2017-04-04 LAB — TSH: TSH: 0.42 u[IU]/mL (ref 0.350–4.500)

## 2017-04-04 LAB — APTT: aPTT: 78 seconds — ABNORMAL HIGH (ref 24–36)

## 2017-04-04 LAB — HIV ANTIBODY (ROUTINE TESTING W REFLEX): HIV Screen 4th Generation wRfx: NONREACTIVE

## 2017-04-04 MED ORDER — POTASSIUM CHLORIDE CRYS ER 20 MEQ PO TBCR: 20.0000 meq | EXTENDED_RELEASE_TABLET | Freq: Once | ORAL | Status: DC

## 2017-04-04 MED ORDER — OMEPRAZOLE 20 MG PO CPDR
40.0000 mg | DELAYED_RELEASE_CAPSULE | Freq: Two times a day (BID) | ORAL | Status: DC
  Administered 2017-04-04 (×2): 40 mg via ORAL
  Filled 2017-04-04 (×3): qty 2

## 2017-04-04 MED ORDER — HEPARIN (PORCINE) IN NACL 100-0.45 UNIT/ML-% IJ SOLN
1500.0000 [IU]/h | INTRAMUSCULAR | Status: DC
  Administered 2017-04-04: 1500 [IU]/h via INTRAVENOUS
  Filled 2017-04-04: qty 250

## 2017-04-04 MED ORDER — OXYCODONE HCL 5 MG PO TABS: 5.0000 mg | ORAL_TABLET | ORAL | Status: DC | PRN

## 2017-04-04 MED ORDER — OXYCODONE-ACETAMINOPHEN 5-325 MG PO TABS: 1.0000 | ORAL_TABLET | ORAL | Status: DC | PRN

## 2017-04-04 MED ORDER — POTASSIUM CHLORIDE CRYS ER 20 MEQ PO TBCR
40.0000 meq | EXTENDED_RELEASE_TABLET | Freq: Once | ORAL | Status: AC
  Administered 2017-04-04: 40 meq via ORAL
  Filled 2017-04-04: qty 2

## 2017-04-04 NOTE — Discharge Summary (Signed)
Discharge Summary    Patient ID: [name],  MRN: [id number], DOB/AGE: 11-10-1960 56 y.o.  Admit date: 04/03/2017 Discharge date: 04/04/2017  Primary Care Provider: [facility] Primary Cardiologist: Dr. Lovena Le   Discharge Diagnoses    Active Problems:   Acute on chronic diastolic congestive heart failure, NYHA class 4 (HCC)   Allergies Allergies  Allergen Reactions  . Potassium Chloride     Dizziness and nausea     Diagnostic Studies/Procedures    None    History of Present Illness     This is a 56 y.o. black male with SA node dysfunction, persistent afib despite being on dofetilide, HTN, DM Seen c/o SOB x 1 d presented to [facility] ED.   Patient's afib history starts from >5 years ago and due to his symptoms, he underwent PVI ablation in 2012. He remained on tikosyn til 3/14 which was then stopped. he has been considered for repeat ablation but refused it on his last visit with Dr. Lovena Le. At that time he was planned to have DCCV on 7/18 but when he arrived, he was in sinus bradycardia. His PPM interrogation demonstrated that he has been in afib for extended periods of time.   He does not feel rapid HR. He only feels lightheaded and SOB with afib. He also has not been very compliant with his medications in the past and has stopped his potassium. Patient said that he never had symptoms for such a long time which lasted for almost a day this time. Symptoms resolve with rest. He has been having similar symptoms for the last 5 years.   Of note, he has h/o NSTEMI possibly type II as there was no significant obstructive lesions noted. Catheterization was done in 2006 in 2009 showing nonobstructive disease. A Myoview scan 2011 was nonischemic. He also is a history of pulmonary embolism  Echocardiogram 03/2016 demonstrated mild LV dysfunction 45-50%. Most recent cath also did not demonstrate significant obs lesions.   In ED, pt was noted to be in atrial  fibrillation w/ RVR and also felt to be volume overloaded. He was given IV Lasix and management of his afib.   Hospital Course     Pt admitted to telemetry. BNP was 280. TSH was normal. Electrolytes were stable. K 3.6. He responded well to lasix. He was given a dose of IV Cardizem and HR improved and he spontaneously converted back to NSR. Hi ssymptoms improved with conversion to SR and after diuresis with IV Lasix. He was seen by Dr. Lovena Le and noted to be stable from a cardiac standpoint for discharge. He was instructed to continue his home meds. No changes were made. The patient has a scheduled appt in the Afib clinic later this week to discuss afib ablation.    Discharge Vitals Blood pressure (!) 142/93, pulse (!) 58, temperature 97.8 F (36.6 C), temperature source Oral, resp. rate 18, height 5\' 11"  (1.803 m), weight 264 lb 12.8 oz (120.1 kg), SpO2 97 %.  Filed Weights   04/03/17 1835 04/03/17 2339  Weight: 272 lb (123.4 kg) 264 lb 12.8 oz (120.1 kg)    Labs & Radiologic Studies    CBC  Recent Labs  04/03/17 1923 04/04/17 0536  WBC 5.3 6.1  HGB 15.6 16.8  HCT 45.5 49.3  MCV 78.0 78.5  PLT 230 741   Basic Metabolic Panel  Recent Labs  04/03/17 1923 04/03/17 1924 04/04/17 0536  NA 139  --  138  K 4.1  --  3.6  CL 108  --  102  CO2 24  --  26  GLUCOSE 81  --  102*  BUN 14  --  11  CREATININE 1.11  --  1.28*  CALCIUM 9.3  --  9.9  MG  --  2.0  --    Liver Function Tests  Recent Labs  04/04/17 0536  AST 26  ALT 23  ALKPHOS 75  BILITOT 0.9  PROT 7.5  ALBUMIN 4.2   No results for input(s): LIPASE, AMYLASE in the last 72 hours. Cardiac Enzymes  Recent Labs  04/04/17 0023 04/04/17 0536  TROPONINI <0.03 <0.03   BNP Invalid input(s): POCBNP D-Dimer No results for input(s): DDIMER in the last 72 hours. Hemoglobin A1C No results for input(s): HGBA1C in the last 72 hours. Fasting Lipid Panel No results for input(s): CHOL, HDL, LDLCALC, TRIG, CHOLHDL,  LDLDIRECT in the last 72 hours. Thyroid Function Tests  Recent Labs  04/04/17 0023  TSH 0.420   _____________  No results found. Disposition   Pt is being discharged home today in good condition.  Follow-up Plans & Appointments    Follow-up Information    Sherran Needs, NP Follow up on 04/07/2017.   Specialties:  Nurse Practitioner, Cardiology Why:  10:00 to discuss afib ablation  Contact information: [place] [address] [phone]          Discharge Instructions    Diet - low sodium heart healthy    Complete by:  As directed    Increase activity slowly    Complete by:  As directed       Discharge Medications   Current Discharge Medication List    CONTINUE these medications which have NOT CHANGED   Details  apixaban (ELIQUIS) 5 MG TABS tablet Take 1 tablet (5 mg total) by mouth 2 (two) times daily. Qty: 60 tablet, Refills: 11    carvedilol (COREG) 25 MG tablet Take 1 tablet (25 mg total) by mouth 2 (two) times daily with a meal. Qty: 180 tablet, Refills: 11    clobetasol cream (TEMOVATE) 7.34 % Apply 1 application topically 2 (two) times daily.    desonide (DESOWEN) 0.05 % cream Apply 1 application topically 2 (two) times daily.    dofetilide (TIKOSYN) 500 MCG capsule TAKE 1 CAPSULE (500 MCG TOTAL) BY MOUTH 2 (TWO) TIMES DAILY. Qty: 180 capsule, Refills: 3    gabapentin (NEURONTIN) 100 MG capsule Take 100 mg by mouth 2 (two) times daily.     methimazole (TAPAZOLE) 10 MG tablet Take 1 tablet (10 mg total) by mouth daily. Qty: 30 tablet, Refills: 1    omeprazole (PRILOSEC) 40 MG capsule Take 40 mg by mouth 2 (two) times daily.     oxyCODONE-acetaminophen (PERCOCET) 10-325 MG per tablet Take 1 tablet by mouth every 4 (four) hours as needed for pain. Qty: 29 tablet, Refills: 0    simvastatin (ZOCOR) 40 MG tablet TAKE 1 TABLET (40 MG TOTAL) BY MOUTH DAILY. Qty: 30 tablet, Refills: 11    triamcinolone ointment (KENALOG) 0.1 % Apply 1  application topically 2 (two) times daily as needed for itching. Refills: 3           Outstanding Labs/Studies     Duration of Discharge Encounter   Greater than 30 minutes including physician time.  Signed, Lyda Jester PA-C 04/04/2017, 3:17 PM  Cardiology Attending  Patient seen and examined. Agree with above.   Cristopher Peru, M.D.

## 2017-04-04 NOTE — Progress Notes (Signed)
Patient ID: [name], male   DOB: 02/07/1961, 56 y.o.   MRN: [id number]  EP Attending  Patient has returned back to NSR. He is stable. He can be discharged home on his current meds. He has an appointment in our atrial fib clinic tomorrow to discuss atrial fib ablation. He should stay on his current meds and avoid caffeine and ETOH.  Mikle Bosworth.D.

## 2017-04-04 NOTE — Discharge Instructions (Signed)
Atrial Fibrillation Atrial fibrillation is a type of irregular or rapid heartbeat (arrhythmia). In atrial fibrillation, the heart quivers continuously in a chaotic pattern. This occurs when parts of the heart receive disorganized signals that make the heart unable to pump blood normally. This can increase the risk for stroke, heart failure, and other heart-related conditions. There are different types of atrial fibrillation, including:  Paroxysmal atrial fibrillation. This type starts suddenly, and it usually stops on its own shortly after it starts.  Persistent atrial fibrillation. This type often lasts longer than a week. It may stop on its own or with treatment.  Long-lasting persistent atrial fibrillation. This type lasts longer than 12 months.  Permanent atrial fibrillation. This type does not go away.  Talk with your health care provider to learn about the type of atrial fibrillation that you have. What are the causes? This condition is caused by some heart-related conditions or procedures, including:  A heart attack.  Coronary artery disease.  Heart failure.  Heart valve conditions.  High blood pressure.  Inflammation of the sac that surrounds the heart (pericarditis).  Heart surgery.  Certain heart rhythm disorders, such as Wolf-Parkinson-White syndrome.  Other causes include:  Pneumonia.  Obstructive sleep apnea.  Blockage of an artery in the lungs (pulmonary embolism, or PE).  Lung cancer.  Chronic lung disease.  Thyroid problems, especially if the thyroid is overactive (hyperthyroidism).  Caffeine.  Excessive alcohol use or illegal drug use.  Use of some medicines, including certain decongestants and diet pills.  Sometimes, the cause cannot be found. What increases the risk? This condition is more likely to develop in:  People who are older in age.  People who smoke.  People who have diabetes mellitus.  People who are overweight  (obese).  Athletes who exercise vigorously.  What are the signs or symptoms? Symptoms of this condition include:  A feeling that your heart is beating rapidly or irregularly.  A feeling of discomfort or pain in your chest.  Shortness of breath.  Sudden light-headedness or weakness.  Getting tired easily during exercise.  In some cases, there are no symptoms. How is this diagnosed? Your health care provider may be able to detect atrial fibrillation when taking your pulse. If detected, this condition may be diagnosed with:  An electrocardiogram (ECG).  A Holter monitor test that records your heartbeat patterns over a 24-hour period.  Transthoracic echocardiogram (TTE) to evaluate how blood flows through your heart.  Transesophageal echocardiogram (TEE) to view more detailed images of your heart.  A stress test.  Imaging tests, such as a CT scan or chest X-ray.  Blood tests.  How is this treated? The main goals of treatment are to prevent blood clots from forming and to keep your heart beating at a normal rate and rhythm. The type of treatment that you receive depends on many factors, such as your underlying medical conditions and how you feel when you are experiencing atrial fibrillation. This condition may be treated with:  Medicine to slow down the heart rate, bring the hearts rhythm back to normal, or prevent clots from forming.  Electrical cardioversion. This is a procedure that resets your hearts rhythm by delivering a controlled, low-energy shock to the heart through your skin.  Different types of ablation, such as catheter ablation, catheter ablation with pacemaker, or surgical ablation. These procedures destroy the heart tissues that send abnormal signals. When the pacemaker is used, it is placed under your skin to help your heart beat in  a regular rhythm.  Follow these instructions at home:  Take over-the counter and prescription medicines only as told by your  health care provider.  If your health care provider prescribed a blood-thinning medicine (anticoagulant), take it exactly as told. Taking too much blood-thinning medicine can cause bleeding. If you do not take enough blood-thinning medicine, you will not have the protection that you need against stroke and other problems.  Do not use tobacco products, including cigarettes, chewing tobacco, and e-cigarettes. If you need help quitting, ask your health care provider.  If you have obstructive sleep apnea, manage your condition as told by your health care provider.  Do not drink alcohol.  Do not drink beverages that contain caffeine, such as coffee, soda, and tea.  Maintain a healthy weight. Do not use diet pills unless your health care provider approves. Diet pills may make heart problems worse.  Follow diet instructions as told by your health care provider.  Exercise regularly as told by your health care provider.  Keep all follow-up visits as told by your health care provider. This is important. How is this prevented?  Avoid drinking beverages that contain caffeine or alcohol.  Avoid certain medicines, especially medicines that are used for breathing problems.  Avoid certain herbs and herbal medicines, such as those that contain ephedra or ginseng.  Do not use illegal drugs, such as cocaine and amphetamines.  Do not smoke.  Manage your high blood pressure. Contact a health care provider if:  You notice a change in the rate, rhythm, or strength of your heartbeat.  You are taking an anticoagulant and you notice increased bruising.  You tire more easily when you exercise or exert yourself. Get help right away if:  You have chest pain, abdominal pain, sweating, or weakness.  You feel nauseous.  You notice blood in your vomit, bowel movement, or urine.  You have shortness of breath.  You suddenly have swollen feet and ankles.  You feel dizzy.  You have sudden weakness or  numbness of the face, arm, or leg, especially on one side of the body.  You have trouble speaking, trouble understanding, or both (aphasia).  Your face or your eyelid droops on one side. These symptoms may represent a serious problem that is an emergency. Do not wait to see if the symptoms will go away. Get medical help right away. Call your local emergency services (911 in the [place] Do not drive yourself to the hospital. This information is not intended to replace advice given to you by your health care provider. Make sure you discuss any questions you have with your health care provider. Document Released: 08/17/2005 Document Revised: 12/25/2015 Document Reviewed: 12/12/2014 Elsevier Interactive Patient Education  2017 Else[employer] Inc[employer]   Aspirin and Your Heart Aspirin is a medicine that affects the way blood clots. Aspirin can be used to help reduce the risk of blood clots, heart attacks, and other heart-related problems. Should I take aspirin? Your health care provider will help you determine whether it is safe and beneficial for you to take aspirin daily. Taking aspirin daily may be beneficial if you:  Have had a heart attack or chest pain.  Have undergone open heart surgery such as coronary artery bypass surgery (CABG).  Have had coronary angioplasty.  Have experienced a stroke or transient ischemic attack (TIA).  Have peripheral vascular disease (PVD).  Have chronic heart rhythm problems such as atrial fibrillation.  Are there any risks of taking aspirin daily? Daily use of  aspirin can increase your risk of side effects. Some of these include:  Bleeding. Bleeding problems can be minor or serious. An example of a minor problem is a cut that does not stop bleeding. An example of a more serious problem is stomach bleeding or bleeding into the brain. Your risk of bleeding is increased if you are also taking non-steroidal anti-inflammatory medicine (NSAIDs).  Increased  bruising.  Upset stomach.  An allergic reaction. People who have nasal polyps have an increased risk of developing an aspirin allergy.  What are some guidelines I should follow when taking aspirin?  Take aspirin only as directed by your health care provider. Make sure you understand how much you should take and what form you should take. The two forms of aspirin are: ? Non-enteric-coated. This type of aspirin does not have a coating and is absorbed quickly. Non-enteric-coated aspirin is usually recommended for people with chest pain. This type of aspirin also comes in a chewable form. ? Enteric-coated. This type of aspirin has a special coating that releases the medicine very slowly. Enteric-coated aspirin causes less stomach upset than non-enteric-coated aspirin. This type of aspirin should not be chewed or crushed.  Drink alcohol in moderation. Drinking alcohol increases your risk of bleeding. When should I seek medical care?  You have unusual bleeding or bruising.  You have stomach pain.  You have an allergic reaction. Symptoms of an allergic reaction include: ? Hives. ? Itchy skin. ? Swelling of the lips, tongue, or face.  You have ringing in your ears. When should I seek immediate medical care?  Your bowel movements are bloody, dark red, or black in color.  You vomit or cough up blood.  You have blood in your urine.  You cough, wheeze, or feel short of breath. If you have any of the following symptoms, this is an emergency. Do not wait to see if the pain will go away. Get medical help at once. Call your local emergency services (911 in the [place] Do not drive yourself to the hospital.  You have severe chest pain, especially if the pain is crushing or pressure-like and spreads to the arms, back, neck, or jaw.  You have stroke-like symptoms, such as: ? Loss of vision. ? Difficulty talking. ? Numbness or weakness on one side of your body. ? Numbness or weakness in your arm  or leg. ? Not thinking clearly or feeling confused.  This information is not intended to replace advice given to you by your health care provider. Make sure you discuss any questions you have with your health care provider. Document Released: 07/30/2008 Document Revised: 12/25/2015 Document Reviewed: 11/22/2013 Elsevier Interactive Patient Education  2018 [employer].

## 2017-04-04 NOTE — Progress Notes (Signed)
Discharge instructions reviewed with patient.  These included, (but not all inclusive of) the following:  Discharge medications with potential side effects, when to call your MD, follow-up appointments, (including an appointment with the CHF clinic tomorrow morning, dietary recommendations, etc.  Patient discharged to private residence with wife.  Ambulated to exit as per his request with wife.  Discharge instructions with patient.

## 2017-04-04 NOTE — H&P (Addendum)
CARDIOLOGY INPATIENT HISTORY AND PHYSICAL EXAMINATION NOTE  Patient ID: [name] MRN: [id number], DOB/AGE: [birth year]   Admit date: 04/03/2017   Primary Physician: [facility] EP: Crissie Sickles MD  Reason for admission: SOB  HPI:  This is a 43 y.o. black male with SA node dysfunction, persistent afib despite being on dofetilide, HTN, DM Seen c/o SOB x 1 d presented to [facility] long ED.   Patient's afib history starts from >5 years ago and due to his symptoms, he underwent PVI ablation in 2012. He remained on tikosyn til 3/14 which was then stopped. he has been considered for repeat ablation but refused it on his last visit with Dr. Lovena Le. At that time he was planned to have DCCV on 7/18 but when he arrived, he was in sinus bradycardia. His PPM interrogation demonstrated that he has been in afib for extended periods of time.  He does not feel rapid HR. He only feels lightheaded and SOB with afib. He also has not been very compliant with his medications in the past and has stopped his potassium. Patient said that he never had symptoms for such a long time which lasted for almost a day this time. Symptoms resolve with rest. He has been having similar symptoms for the last 5 years.  Of note, he has h/o NSTEMI possibly type II as there was no significant obstructive lesions noted. Catheterization was done in 2006 in 2009  showing nonobstructive disease. A Myoview scan 2011 was nonischemic. He also is a history of pulmonary embolism   Echocardiogram 03/2016 demonstrated mild LV dysfunction 45-50%. Most recent cath also did not demonstrate significant obs lesions.   DATE TEST    **11*/12 Echo   EF 45-50 %   8/17    Echo   EF 45-50 %   9/17 CATH Ef 45% No obstructive CAD     Problem List: Past Medical History:  Diagnosis Date  . Atrial fibrillation ([place])    s/p afib ablation  PVI 5/12 JA  . CAD -nonobstructive    S/P nstemi-type II cath 05/2008 - nonobs. dzs.   . DDD (degenerative disc disease)    evaluate by neurosurgery is ongoing  . Depression    ptsd  . Erectile dysfunction   . Headache(784.0)   . Hemorrhoids   . HTN (hypertension)   . Hyperlipemia   . Hyperthyroidism    [place] dz on methimazole and followed by [name]  . Obesity   . Pancreatitis   . Polysubstance abuse    cocaine, last used 1999  . PUD (peptic ulcer disease)    gastritis due to ETOH previously  . Pulmonary embolism ([place]) 04/2003   Hx of  . Rectal bleeding   . Sinus node dysfunction ([facility])    s/p MDT PPM - AAI - V port plugged  . Sleep apnea    uses CPAP [facility] 2 YRS  . Systolic and diastolic CHF, chronic ([place])    EF 35% by most recent echo 09/19/10  . 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 ANTERIOR CERVICAL DECOMP/DISCECTOMY FUSION  12/15/2011   Procedure: ANTERIOR CERVICAL DECOMPRESSION/DISCECTOMY FUSION 1 LEVEL;  Surgeon: [name], MD;  Location: [place] [facility];  Service: Neurosurgery;  Laterality: N/A;  Cervical Six-Seven Anterior Cervical Diskectomy fusion with Allograft and Plating  . ATRIAL ABLATION SURGERY  12/2010   afib ablation by Dr  Allred  . BACK SURGERY    . CARDIAC CATHETERIZATION     CADIAC ABLATION [name] 12/2010  . CARDIAC CATHETERIZATION N/A 05/08/2016   Procedure: Left Heart Cath and Coronary Angiography;  Surgeon: Peter M Martinique, MD;  Location: [facility];  Service: Cardiovascular;  Laterality: N/A;  . Fingers removed from right hand.  2/84   traumatic work injury  . INSERT / REPLACE / REMOVE PACEMAKER     05/2008    . LUMBAR SPINE SURGERY  02/2009   Dr. Trenton Gammon  . PACEMAKER INSERTION  10/09   by Dr [name]     Allergies:  Allergies  Allergen Reactions  . Potassium Chloride     Dizziness and nausea      Home Medications Current Facility-Administered Medications  Medication Dose Route Frequency Provider Last Rate Last Dose  . aspirin EC  tablet 81 mg  81 mg Oral Daily Geralynn Ochs T, MD      . carvedilol (COREG) tablet 25 mg  25 mg Oral BID WC Geralynn Ochs T, MD      . dofetilide [facility]) capsule 500 mcg  500 mcg Oral BID Geralynn Ochs T, MD   500 mcg at 04/04/17 0110  . furosemide (LASIX) injection 40 mg  40 mg Intravenous Q6H Geralynn Ochs T, MD   40 mg at 04/04/17 0108  . gabapentin (NEURONTIN) capsule 100 mg  100 mg Oral BID Geralynn Ochs T, MD   100 mg at 04/04/17 0109  . heparin ADULT infusion 100 units/mL (25000 units/293mL sodium chloride 0.45%)  1,500 Units/hr Intravenous Continuous Erenest Blank, RPH 15 mL/hr at 04/04/17 0109 1,500 Units/hr at 04/04/17 0109  . methimazole (TAPAZOLE) tablet 10 mg  10 mg Oral Daily Geralynn Ochs T, MD      . omeprazole (PRILOSEC) capsule 40 mg  40 mg Oral BID Geralynn Ochs T, MD      . oxyCODONE-acetaminophen (PERCOCET/ROXICET) 5-325 MG per tablet 1 tablet  1 tablet Oral Q4H PRN Skeet Latch, MD       And  . oxyCODONE (Oxy IR/ROXICODONE) immediate release tablet 5 mg  5 mg Oral Q4H PRN Skeet Latch, MD      . simvastatin (ZOCOR) tablet 40 mg  40 mg Oral q1800 Flossie Dibble, MD         Family History  Problem Relation Age of Onset  . Heart disease Mother   . Breast cancer Mother   . Lung cancer Father   . Diabetes Unknown   . Colon cancer Neg Hx      Social History   Social History  . Marital status: Legally Separated    Spouse name: N/A  . Number of children: 7  . Years of education: N/A   Occupational History  . unemployed Unemployed   Social History Main Topics  . Smoking status: Former Smoker    Packs/day: 1.00    Years: 15.00    Types: Cigarettes    Quit date: 04/04/2003  . Smokeless tobacco: Never Used  . Alcohol use No     Comment: formerly heavy ETOH,  now drinks a fifth of liquor every 2 weeks  . Drug use: No     Comment: previously used crack cocaine, quit 1999,  + marijuana  . Sexual activity: Yes    Birth control/ protection:  Condom   Other Topics Concern  . Not on file   Social History Narrative   Unemployed currently.  Previously worked as a Dealer.  Lives in [place].     Review of Systems: General: negative for chills, fever, night sweats or weight changes.  Cardiovascular: lightheadedness, dyspnea and chest pressure Dermatological: negative for rash Respiratory: negative for cough or wheezing Urologic: negative for hematuria Abdominal: negative for nausea, vomiting, diarrhea, bright red blood per rectum, melena, or hematemesis Neurologic: negative for visual changes, syncope, or dizziness Endocrine: no diabetes, hyperthyroidism Immunological: no lymph adenopathy Psych: non homicidal/suicidal  Physical Exam: Vitals: BP 134/86 (BP Location: Left Arm)   Pulse 61   Temp 97.6 F (36.4 C) (Oral)   Resp 18   Ht 5\' 11"  (1.803 m)   Wt 120.1 kg (264 lb 12.8 oz)   SpO2 96%   BMI 36.93 kg/m  General: not in acute distress Neck: JVP flat, neck supple Heart: irregular rate and rhythm, S1, S2, no murmurs  Lungs: CTAB  GI: non tender, non distended, bowel sounds present Extremities: no edema Neuro: AAO x 3  Psych: normal affect, no anxiety   Labs:   Results for orders placed or performed during the hospital encounter of 04/03/17 (from the past 24 hour(s))  Urinalysis, Routine w reflex microscopic     Status: Abnormal   Collection Time: 04/03/17  6:23 PM  Result Value Ref Range   Color, Urine YELLOW YELLOW   APPearance CLEAR CLEAR   Specific Gravity, Urine 1.012 1.005 - 1.030   pH 6.0 5.0 - 8.0   Glucose, UA NEGATIVE NEGATIVE mg/dL   Hgb urine dipstick MODERATE (A) NEGATIVE   Bilirubin Urine NEGATIVE NEGATIVE   Ketones, ur NEGATIVE NEGATIVE mg/dL   Protein, ur NEGATIVE NEGATIVE mg/dL   Nitrite NEGATIVE NEGATIVE   Leukocytes, UA NEGATIVE NEGATIVE   RBC / HPF 0-5 0 - 5 RBC/hpf   WBC, UA 0-5 0 - 5 WBC/hpf   Bacteria, UA NONE SEEN NONE SEEN   Squamous Epithelial / LPF 0-5 (A) NONE SEEN    Mucous PRESENT   CBG monitoring, ED     Status: Abnormal   Collection Time: 04/03/17  6:35 PM  Result Value Ref Range   Glucose-Capillary 114 (H) 65 - 99 mg/dL  Basic metabolic panel     Status: None   Collection Time: 04/03/17  7:23 PM  Result Value Ref Range   Sodium 139 135 - 145 mmol/L   Potassium 4.1 3.5 - 5.1 mmol/L   Chloride 108 101 - 111 mmol/L   CO2 24 22 - 32 mmol/L   Glucose, Bld 81 65 - 99 mg/dL   BUN 14 6 - 20 mg/dL   Creatinine, Ser 1.11 0.61 - 1.24 mg/dL   Calcium 9.3 8.9 - 10.3 mg/dL   GFR calc non Af Amer >60 >60 mL/min   GFR calc Af Amer >60 >60 mL/min   Anion gap 7 5 - 15  CBC     Status: Abnormal   Collection Time: 04/03/17  7:23 PM  Result Value Ref Range   WBC 5.3 4.0 - 10.5 K/uL   RBC 5.83 (H) 4.22 - 5.81 MIL/uL   Hemoglobin 15.6 13.0 - 17.0 g/dL   HCT 45.5 39.0 - 52.0 %   MCV 78.0 78.0 - 100.0 fL   MCH 26.8 26.0 - 34.0 pg   MCHC 34.3 30.0 - 36.0 g/dL   RDW 16.2 (H) 11.5 - 15.5 %   Platelets 230 150 - 400 K/uL  Brain natriuretic peptide     Status: Abnormal   Collection Time: 04/03/17  7:23 PM  Result Value Ref Range  B Natriuretic Peptide 208.0 (H) 0.0 - 100.0 pg/mL  TSH     Status: Abnormal   Collection Time: 04/03/17  7:23 PM  Result Value Ref Range   TSH 0.297 (L) 0.350 - 4.500 uIU/mL  Magnesium     Status: None   Collection Time: 04/03/17  7:24 PM  Result Value Ref Range   Magnesium 2.0 1.7 - 2.4 mg/dL  I-Stat Troponin, ED (not at [facility])     Status: None   Collection Time: 04/03/17  7:34 PM  Result Value Ref Range   Troponin i, poc 0.02 0.00 - 0.08 ng/mL   Comment 3             Radiology/Studies: No results found.  EKG: afib with RVR, LAD, LVH IVCD  Echo: 04/24/2016 - Left ventricle: The cavity size was mildly dilated. Systolic   function was mildly to moderately reduced. The estimated ejection   fraction was in the range of 40% to 45%. There is hypokinesis of   the mid-apicalanteroseptal myocardium. Doppler parameters are    consistent with abnormal left ventricular relaxation (grade 1   diastolic dysfunction). - Aortic valve: Trileaflet; mildly thickened, mildly calcified   leaflets. - Aorta: Ascending aortic diameter: 39 mm (S). - Ascending aorta: The ascending aorta was mildly dilated. - Left atrium: The atrium was mildly dilated. - Right ventricle: The cavity size was mildly dilated. Wall   thickness was normal.  Cardiac cath:    Prox LAD to Mid LAD lesion, 25 %stenosed.  Prox Cx to Mid Cx lesion, 30 %stenosed.  Mid RCA lesion, 30 %stenosed.  Acute Mrg lesion, 60 %stenosed.  There is mild to moderate left ventricular systolic dysfunction.  LV end diastolic pressure is normal.  The left ventricular ejection fraction is 45-50% by visual estimate.   Medical decision making:  Discussed care with the patient Discussed care with the physician on the phone Reviewed labs and imaging personally Reviewed prior records  ASSESSMENT AND PLAN:  This is a 56 y.o. male with paroxysmal atrial fibrillation with lightheadedness and a related fall as well as SOB presented    Active Problems:   Acute on chronic diastolic congestive heart failure, NYHA class 4 (HCC)   Dyspnea, multifactorial likely 2/2 afib/CHF  - will IV diuresis lasix 40 x 2 doses, reasses in AM, continue home meds for afib.   Acute on chronic diastolic heart failrue, NYHA class IV, uncontrolled HR triggered by afib w/RVR, elevated BNP PLAN - strict I/Os, IV diuresis with lasix, daily weights, low sodium diet, keep potassium >4, calcium >9, magnesium >2, echocardiogram reviewed  Persistent atrial fibrillation with RVR, not adequately rhythm controlled with tikosyn, Non valvular atrial fibrillation, likely trigger is mild heart failure and marginally controlled hyperthyroidism, prior history of CHF, hypertension, DM (CHADS2VASC = 3), maintained on elquis for anticoagulation, LA mildly dilated,  Currently rate controlled on arrival PLAN:    Admit and treat for CHF, possible plan for DCCV vs.  Ablation after discussion with EP/patient, will keep patient NPO post midnight, will keep on tikosyn for now,  Checking TSH, electrolytes, will keep potassium >4, calcium >9, magnesium >2, stopped eliquis and started on IV heparin in lieu of possible procedure  Moderate CAD - on A/C Hypertension, essential - continue home medications Non compliance with sodium intake Hyperthyroidism - remains overactive. Recently started taking methimazole.    Signed, Flossie Dibble, MD MS 04/04/2017, 1:22 AM

## 2017-04-04 NOTE — Progress Notes (Signed)
ANTICOAGULATION CONSULT NOTE - Initial Consult  Pharmacy Consult for Heparin (holding Apixaban) Indication: atrial fibrillation  Allergies  Allergen Reactions  . Potassium Chloride     Dizziness and nausea    Patient Measurements: Height: 5\' 11"  (180.3 cm) Weight: 264 lb 12.8 oz (120.1 kg) IBW/kg (Calculated) : 75.3  Vital Signs: Temp: 97.8 F (36.6 C) (08/05 0532) Temp Source: Axillary (08/05 0532) BP: 113/58 (08/05 0532) Pulse Rate: 61 (08/04 2339)  Labs:  Recent Labs  04/03/17 1923 04/04/17 0023 04/04/17 0536 04/04/17 0936  HGB 15.6  --  16.8  --   HCT 45.5  --  49.3  --   PLT 230  --  249  --   APTT  --   --   --  78*  LABPROT  --   --  13.7  --   INR  --   --  1.04  --   HEPARINUNFRC  --   --   --  1.07*  CREATININE 1.11  --  1.28*  --   TROPONINI  --  <0.03 <0.03  --     Estimated Creatinine Clearance: 83.9 mL/min (A) (by C-G formula based on SCr of 1.28 mg/dL (H)).   Medical History: Past Medical History:  Diagnosis Date  . Atrial fibrillation ([place])    s/p afib ablation  PVI 5/12 JA  . CAD -nonobstructive    S/P nstemi-type II cath 05/2008 - nonobs. dzs.  . DDD (degenerative disc disease)    evaluate by neurosurgery is ongoing  . Depression    ptsd  . Erectile dysfunction   . Headache(784.0)   . Hemorrhoids   . HTN (hypertension)   . Hyperlipemia   . Hyperthyroidism    Graves dz on methimazole and followed by [name]  . Obesity   . Pancreatitis   . Polysubstance abuse    cocaine, last used 1999  . PUD (peptic ulcer disease)    gastritis due to ETOH previously  . Pulmonary embolism ([place]) 04/2003   Hx of  . Rectal bleeding   . Sinus node dysfunction ([facility])    s/p MDT PPM - AAI - V port plugged  . Sleep apnea    uses CPAP [facility] 2 YRS  . Systolic and diastolic CHF, chronic ([place])    EF 35% by most recent echo 09/19/10  . 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 Holding apixaban and starting heparin in anticipation of procedures. Last dose apixaban was given on 8/4 at 0900, now on heparin. Will likely be using aPTT to dose heparin for the next 24-48 hours given Apixaban influence on anti-Xa levels. H/H & PLTs wnl. No signs/sx of bleeding noted.  0900 HL 1.07 (elevated from Eliquis) APTT 78 (therapeutic)  Goal of Therapy:  Heparin level 0.3-0.7 units/ml aPTT 66-102 seconds Monitor platelets by anticoagulation protocol: Yes   Plan:  Continue heparin drip at 1500 units/hr 1530 aPTT/HL Daily CBC/HL/aPTT until aPTT/HL are correlating  F/u daily heparin level & aPTT, CBCs, until aPTT/HL are correlating  F/u signs/sx bleeding  Nida Boatman, PharmD PGY1 Acute Care Pharmacy Resident Pager: [phone] 04/04/2017,10:26 AM

## 2017-04-04 NOTE — Progress Notes (Signed)
ANTICOAGULATION CONSULT NOTE - Initial Consult  Pharmacy Consult for Heparin (holding Apixaban) Indication: atrial fibrillation  Allergies  Allergen Reactions  . Potassium Chloride     Dizziness and nausea    Patient Measurements: Height: 5\' 11"  (180.3 cm) Weight: 264 lb 12.8 oz (120.1 kg) IBW/kg (Calculated) : 75.3  Vital Signs: Temp: 97.6 F (36.4 C) (08/04 2339) Temp Source: Oral (08/04 2339) BP: 134/86 (08/04 2339) Pulse Rate: 61 (08/04 2339)  Labs:  Recent Labs  04/03/17 1923  HGB 15.6  HCT 45.5  PLT 230  CREATININE 1.11    Estimated Creatinine Clearance: 96.8 mL/min (by C-G formula based on SCr of 1.11 mg/dL).   Medical History: Past Medical History:  Diagnosis Date  . Atrial fibrillation ([place])    s/p afib ablation  PVI 5/12 JA  . CAD -nonobstructive    S/P nstemi-type II cath 05/2008 - nonobs. dzs.  . DDD (degenerative disc disease)    evaluate by neurosurgery is ongoing  . Depression    ptsd  . Erectile dysfunction   . Headache(784.0)   . Hemorrhoids   . HTN (hypertension)   . Hyperlipemia   . Hyperthyroidism    Graves dz on methimazole and followed by [name]  . Obesity   . Pancreatitis   . Polysubstance abuse    cocaine, last used 1999  . PUD (peptic ulcer disease)    gastritis due to ETOH previously  . Pulmonary embolism ([place]) 04/2003   Hx of  . Rectal bleeding   . Sinus node dysfunction (H[facility])    s/p MDT PPM - AAI - V port plugged  . Sleep apnea    uses CPAP [facility] 2 YRS  . Systolic and diastolic CHF, chronic ([place])    EF 35% by most recent echo 09/19/10  . 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 Holding apixaban and starting heparin in anticipation of ?procedures. Last dose apixaban was >12 hours ago so will start heparin now. Will likely be using aPTT to dose heparin for the next 24-48 hours given Apixaban influence on anti-Xa levels.   Goal of  Therapy:  Heparin level 0.3-0.7 units/ml aPTT 66-102 seconds Monitor platelets by anticoagulation protocol: Yes   Plan:  Start heparin drip at 1500 units/hr 0900 aPTT/HL Daily CBC/HL/aPTT until aPTT/HL are correlating  Monitor for bleeding   Narda Bonds 04/04/2017,12:31 AM

## 2017-04-05 ENCOUNTER — Ambulatory Visit (HOSPITAL_COMMUNITY)
Admission: RE | Admit: 2017-04-05 | Discharge: 2017-04-05 | Disposition: A | Discharge status: Home / Self Care | Payer: Medicaid Other | Source: Ambulatory Visit | Attending: Nurse Practitioner | Admitting: Nurse Practitioner

## 2017-04-05 ENCOUNTER — Emergency Department (HOSPITAL_COMMUNITY): Payer: Medicaid Other

## 2017-04-05 ENCOUNTER — Encounter (HOSPITAL_COMMUNITY): Payer: Self-pay | Admitting: Nurse Practitioner

## 2017-04-05 ENCOUNTER — Encounter (HOSPITAL_COMMUNITY): Payer: Self-pay

## 2017-04-05 ENCOUNTER — Emergency Department (HOSPITAL_COMMUNITY)
Admission: EM | Admit: 2017-04-05 | Discharge: 2017-04-05 | Disposition: A | Discharge status: Home / Self Care | Payer: Medicaid Other | Attending: Emergency Medicine | Admitting: Emergency Medicine

## 2017-04-05 VITALS — BP 126/92 | HR 71 | Ht 71.0 in | Wt 264.6 lb

## 2017-04-05 DIAGNOSIS — I252 Old myocardial infarction: Secondary | ICD-10-CM | POA: Diagnosis not present

## 2017-04-05 DIAGNOSIS — I5042 Chronic combined systolic (congestive) and diastolic (congestive) heart failure: Secondary | ICD-10-CM | POA: Diagnosis not present

## 2017-04-05 DIAGNOSIS — Z803 Family history of malignant neoplasm of breast: Secondary | ICD-10-CM | POA: Insufficient documentation

## 2017-04-05 DIAGNOSIS — N529 Male erectile dysfunction, unspecified: Secondary | ICD-10-CM | POA: Insufficient documentation

## 2017-04-05 DIAGNOSIS — I480 Paroxysmal atrial fibrillation: Secondary | ICD-10-CM | POA: Diagnosis not present

## 2017-04-05 DIAGNOSIS — Z888 Allergy status to other drugs, medicaments and biological substances status: Secondary | ICD-10-CM | POA: Insufficient documentation

## 2017-04-05 DIAGNOSIS — I2510 Atherosclerotic heart disease of native coronary artery without angina pectoris: Secondary | ICD-10-CM | POA: Insufficient documentation

## 2017-04-05 DIAGNOSIS — F329 Major depressive disorder, single episode, unspecified: Secondary | ICD-10-CM | POA: Insufficient documentation

## 2017-04-05 DIAGNOSIS — E669 Obesity, unspecified: Secondary | ICD-10-CM | POA: Insufficient documentation

## 2017-04-05 DIAGNOSIS — M79602 Pain in left arm: Secondary | ICD-10-CM

## 2017-04-05 DIAGNOSIS — E785 Hyperlipidemia, unspecified: Secondary | ICD-10-CM | POA: Insufficient documentation

## 2017-04-05 DIAGNOSIS — G4733 Obstructive sleep apnea (adult) (pediatric): Secondary | ICD-10-CM | POA: Insufficient documentation

## 2017-04-05 DIAGNOSIS — Z981 Arthrodesis status: Secondary | ICD-10-CM | POA: Insufficient documentation

## 2017-04-05 DIAGNOSIS — Z8711 Personal history of peptic ulcer disease: Secondary | ICD-10-CM | POA: Insufficient documentation

## 2017-04-05 DIAGNOSIS — Z79899 Other long term (current) drug therapy: Secondary | ICD-10-CM | POA: Insufficient documentation

## 2017-04-05 DIAGNOSIS — Z86711 Personal history of pulmonary embolism: Secondary | ICD-10-CM | POA: Diagnosis not present

## 2017-04-05 DIAGNOSIS — Z801 Family history of malignant neoplasm of trachea, bronchus and lung: Secondary | ICD-10-CM | POA: Insufficient documentation

## 2017-04-05 DIAGNOSIS — Z7901 Long term (current) use of anticoagulants: Secondary | ICD-10-CM | POA: Insufficient documentation

## 2017-04-05 DIAGNOSIS — Z950 Presence of cardiac pacemaker: Secondary | ICD-10-CM | POA: Insufficient documentation

## 2017-04-05 DIAGNOSIS — Z6836 Body mass index (BMI) 36.0-36.9, adult: Secondary | ICD-10-CM | POA: Diagnosis not present

## 2017-04-05 DIAGNOSIS — I5033 Acute on chronic diastolic (congestive) heart failure: Secondary | ICD-10-CM | POA: Insufficient documentation

## 2017-04-05 DIAGNOSIS — Z87891 Personal history of nicotine dependence: Secondary | ICD-10-CM | POA: Insufficient documentation

## 2017-04-05 DIAGNOSIS — Z8249 Family history of ischemic heart disease and other diseases of the circulatory system: Secondary | ICD-10-CM | POA: Insufficient documentation

## 2017-04-05 DIAGNOSIS — I4891 Unspecified atrial fibrillation: Secondary | ICD-10-CM | POA: Diagnosis present

## 2017-04-05 DIAGNOSIS — I495 Sick sinus syndrome: Secondary | ICD-10-CM | POA: Insufficient documentation

## 2017-04-05 DIAGNOSIS — I110 Hypertensive heart disease with heart failure: Secondary | ICD-10-CM | POA: Insufficient documentation

## 2017-04-05 DIAGNOSIS — E0590 Thyrotoxicosis, unspecified without thyrotoxic crisis or storm: Secondary | ICD-10-CM | POA: Insufficient documentation

## 2017-04-05 LAB — CBC
HCT: 49.6 % (ref 39.0–52.0)
Hemoglobin: 17.1 g/dL — ABNORMAL HIGH (ref 13.0–17.0)
MCH: 27.2 pg (ref 26.0–34.0)
MCHC: 34.5 g/dL (ref 30.0–36.0)
MCV: 79 fL (ref 78.0–100.0)
PLATELETS: 234 10*3/uL (ref 150–400)
RBC: 6.28 MIL/uL — ABNORMAL HIGH (ref 4.22–5.81)
RDW: 16.1 % — AB (ref 11.5–15.5)
WBC: 5.4 10*3/uL (ref 4.0–10.5)

## 2017-04-05 LAB — BASIC METABOLIC PANEL
Anion gap: 14 (ref 5–15)
BUN: 20 mg/dL (ref 6–20)
CO2: 22 mmol/L (ref 22–32)
CREATININE: 1.41 mg/dL — AB (ref 0.61–1.24)
Calcium: 9.7 mg/dL (ref 8.9–10.3)
Chloride: 100 mmol/L — ABNORMAL LOW (ref 101–111)
GFR, EST NON AFRICAN AMERICAN: 54 mL/min — AB (ref >60)
Glucose, Bld: 102 mg/dL — ABNORMAL HIGH (ref 65–99)
POTASSIUM: 4.3 mmol/L (ref 3.5–5.1)
SODIUM: 136 mmol/L (ref 135–145)

## 2017-04-05 LAB — I-STAT TROPONIN, ED
TROPONIN I, POC: 0.01 ng/mL (ref 0.00–0.08)
Troponin i, poc: 0 ng/mL (ref 0.00–0.08)

## 2017-04-05 LAB — HEMOGLOBIN A1C
Hgb A1c MFr Bld: 4.8 % (ref 4.8–5.6)
MEAN PLASMA GLUCOSE: 91 mg/dL

## 2017-04-05 MED ORDER — IBUPROFEN 400 MG PO TABS
400.0000 mg | ORAL_TABLET | Freq: Once | ORAL | Status: AC
  Administered 2017-04-05: 400 mg via ORAL
  Filled 2017-04-05: qty 1

## 2017-04-05 MED ORDER — HYDROCODONE-ACETAMINOPHEN 5-325 MG PO TABS: 1.0000 | ORAL_TABLET | Freq: Once | ORAL | Status: DC

## 2017-04-05 NOTE — Progress Notes (Signed)
Report of patient being sent to ER was given to [facility]. Pt was in no distress upon leaving our clinic to be transported to ER for chest pain and left arm pain.

## 2017-04-05 NOTE — Progress Notes (Signed)
   Primary Care Physician: [facility] Referring Physician: Dr. Allred Referring: [facility] f/u   [name] is a 55 y.o. male  With h/o s/p afib ablation by Dr. Allred in 2012.  He  t continues to have atrial arrhythmias.  He has atrial flutter/ atach./afib with very fast ventricular rates and symptoms of presyncope.  He feels that episodes have worsened. He is on tikosyn without relief.He has had  LHC in 2017. He did not have any obstructive lesions, but did have a NSTEMI in 2009.  Medical therapy was advised. He was scheduled for ablation in past but  cancelled procedure.  He was admitted to hospital 8/4 thru 8/5 with acute on chronic diastolic HF and Afib with RVR. He was suppose to f/u in afib clinic on Wednesday, to discuss options going forward. However, he walked into clinic today c/o left arm pain, that started on awakening this am, that feels the same as when he had his heart attack. EKG shows SR,non specific ST changes. No sweating or shortness of breath. Nothing in history of yesterday PM that would have triggered arm discomfort. He does state that he is now ready to have ablation.  Today, he denies symptoms of palpitations, chest pain, shortness of breath, orthopnea, PND, lower extremity edema, dizziness, presyncope, syncope, or neurologic sequela. Positive for left arm pain. The patient is tolerating medications without difficulties and is otherwise without complaint today.   Past Medical History:  Diagnosis Date  . Atrial fibrillation ([facility])    s/p afib ablation  PVI 5/12 JA  . CAD -nonobstructive    S/P nstemi-type II cath 05/2008 - nonobs. dzs.  . DDD (degenerative disc disease)    evaluate by neurosurgery is ongoing  . Depression    ptsd  . Erectile dysfunction   . Headache(784.0)   . Hemorrhoids   . HTN (hypertension)   . Hyperlipemia   . Hyperthyroidism    Graves dz on methimazole and followed by Dr Ellison  . Obesity   . Pancreatitis   .  Polysubstance abuse    cocaine, last used 1999  . PUD (peptic ulcer disease)    gastritis due to ETOH previously  . Pulmonary embolism ([facility]) 04/2003   Hx of  . Rectal bleeding   . Sinus node dysfunction (HCC)    s/p MDT PPM - AAI - V port plugged  . Sleep apnea    uses CPAP [facility] 2 YRS  . Systolic and diastolic CHF, chronic (HCC)    EF 35% by most recent echo 09/19/10  . 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 ANTERIOR CERVICAL DECOMP/DISCECTOMY FUSION  12/15/2011   Procedure: ANTERIOR CERVICAL DECOMPRESSION/DISCECTOMY FUSION 1 LEVEL;  Surgeon: Henry A Pool, MD;  Location: [facility]UR[facility];  Service: Neurosurgery;  Laterality: N/A;  Cervical Six-Seven Anterior Cervical Diskectomy fusion with Allograft and Plating  . ATRIAL ABLATION SURGERY  12/2010   afib ablation by Dr Allred  . BACK SURGERY    . CARDIAC CATHETERIZATION     CADIAC ABLATION DR ALLRED 12/2010  . CARDIAC CATHETERIZATION N/A 05/08/2016   Procedure: Left Heart Cath and Coronary Angiography;  Surgeon: Peter M Jordan, MD;  Location: [facility] INVASIVE CV LAB;  Service: Cardiovascular;  Laterality: N/A;  . Fingers removed from right hand.  2/84   traumatic work injury  . INSERT / REPLACE / REMOVE PACEMAKER       05/2008    . LUMBAR SPINE SURGERY  02/2009   Dr. Poole  . PACEMAKER INSERTION  10/09   by Dr Klein    Current Outpatient Prescriptions  Medication Sig Dispense Refill  . apixaban (ELIQUIS) 5 MG TABS tablet Take 1 tablet (5 mg total) by mouth 2 (two) times daily. 60 tablet 11  . carvedilol (COREG) 25 MG tablet Take 1 tablet (25 mg total) by mouth 2 (two) times daily with a meal. 180 tablet 11  . clobetasol cream (TEMOVATE) 0.05 % Apply 1 application topically 2 (two) times daily.    . desonide (DESOWEN) 0.05 % cream Apply 1 application topically 2 (two) times daily.    . dofetilide (TIKOSYN) 500 MCG capsule TAKE 1 CAPSULE (500 MCG TOTAL) BY MOUTH 2  (TWO) TIMES DAILY. 180 capsule 3  . gabapentin (NEURONTIN) 100 MG capsule Take 100 mg by mouth 2 (two) times daily.     . methimazole (TAPAZOLE) 10 MG tablet Take 1 tablet (10 mg total) by mouth daily. 30 tablet 1  . omeprazole (PRILOSEC) 40 MG capsule Take 40 mg by mouth 2 (two) times daily.     . oxyCODONE-acetaminophen (PERCOCET) 10-325 MG per tablet Take 1 tablet by mouth every 4 (four) hours as needed for pain. 29 tablet 0  . simvastatin (ZOCOR) 40 MG tablet TAKE 1 TABLET (40 MG TOTAL) BY MOUTH DAILY. 30 tablet 11  . triamcinolone ointment (KENALOG) 0.1 % Apply 1 application topically 2 (two) times daily as needed for itching.  3   No current facility-administered medications for this encounter.     Allergies  Allergen Reactions  . Potassium Chloride     Dizziness and nausea     Social History   Social History  . Marital status: Legally Separated    Spouse name: N/A  . Number of children: 7  . Years of education: N/A   Occupational History  . unemployed Unemployed   Social History Main Topics  . Smoking status: Former Smoker    Packs/day: 1.00    Years: 15.00    Types: Cigarettes    Quit date: 04/04/2003  . Smokeless tobacco: Never Used  . Alcohol use No     Comment: formerly heavy ETOH,  now drinks a fifth of liquor every 2 weeks  . Drug use: No     Comment: previously used crack cocaine, quit 1999,  + marijuana  . Sexual activity: Yes    Birth control/ protection: Condom   Other Topics Concern  . Not on file   Social History Narrative   Unemployed currently.  Previously worked as a mechanic.  Lives in [place].    Family History  Problem Relation Age of Onset  . Heart disease Mother   . Breast cancer Mother   . Lung cancer Father   . Diabetes Unknown   . Colon cancer Neg Hx     ROS- All systems are reviewed and negative except as per the HPI above  Physical Exam: Vitals:   04/05/17 1010  BP: (!) 126/92  Pulse: 71  Weight: 264 lb 9.6 oz (120 kg)    Height: 5' 11" (1.803 m)    GEN- The patient is well appearing, alert and oriented x 3 today.   Head- normocephalic, atraumatic Eyes-  Sclera clear, conjunctiva pink Ears- hearing intact Oropharynx- clear Neck- supple, no JVP Lymph- no cervical lymphadenopathy Lungs- Clear to ausculation bilaterally, normal work of breathing Heart- Regular rate and rhythm, no murmurs, rubs or gallops,   PMI not laterally displaced GI- soft, NT, ND, + BS Extremities- no clubbing, cyanosis, or edema MS- no significant deformity or atrophy Skin- no rash or lesion Psych- euthymic mood, full affect Neuro- strength and sensation are intact  EKG- Sinus rhythm at 71 bpm, LAFB, SNst/t wave changes, prolonged qt at 462 ms Epic records reviewed Paceart reviewed and showed afib burden at 13.8%   Assessment and Plan: 1. Left arm pain Present upon awakening this am States it is the same type of pain that he remembers with NSTEMI in 2009 Ekg with nonspecific changes  2. Paroxysmal afib Pt is ready to commit to ablation, availability is open this Thursday and pt is interested in having then, if he checks out ok in the ER Continue apixaban, states no missed doses Continue carvedilol Continue dofetilide  3. OSA Compliance with cpap encouraged  3. Nonobstructive CAD Medical management has been advised in the past Previous LHC 9/17, with nonobstructive disease  4. Sick sinus Normal pacemaker function when last evaluated with Dr. Allred   Discussed with Dr. Allred and pt will go to the ER for his c/o left arm pain   Kailen Name C. Dainelle Hun, ANP-C Afib Clinic [facility] [address] [place], [place] [address] [phone]  

## 2017-04-05 NOTE — ED Provider Notes (Signed)
[facility] DEPT Provider Note   CSN: [id number] Arrival date & time: 04/05/17  1034     History   Chief Complaint Chief Complaint  Patient presents with  . Arm Pain   HPI   Blood pressure 121/86, pulse (!) 57, temperature 98.3 F (36.8 C), temperature source Oral, resp. rate 15, SpO2 99 %.  [name] is a 56 y.o. male  c/o left arm pain that started this morning. The pain travels from his shoulder down to his fingers, although the majority of the pain is located in his elbow and forearm. The pain occurs in waves, has a throbbing sensation and causes the patient to have numbness/tingling in his fingers during these episodes. Pt is concerned bc the arm pain is similar to that of his last heart attack however he has not had any CP, SOB, or palpitations. He was discharged from the hospital yesterday after being admitted for CHF exacerbation and paroxysmal afib. Pt is anticoagulated with Eliquis and denies swelling, trauma, streaking, or feeling of warmth to his left arm. He reports when he presses on his forearm muscle (brachioradialis) he is able to mimic the pain and it causes shooting pain/tingling in his fingers. Pain is a 7/10 per pt at this time.   Past Medical History:  Diagnosis Date  . Atrial fibrillation ([place])    s/p afib ablation  PVI 5/12 JA  . CAD -nonobstructive    S/P nstemi-type II cath 05/2008 - nonobs. dzs.  . DDD (degenerative disc disease)    evaluate by neurosurgery is ongoing  . Depression    ptsd  . Erectile dysfunction   . Headache(784.0)   . Hemorrhoids   . HTN (hypertension)   . Hyperlipemia   . Hyperthyroidism    Graves dz on methimazole and followed by [name]  . Obesity   . Pancreatitis   . Polysubstance abuse    cocaine, last used 1999  . PUD (peptic ulcer disease)    gastritis due to ETOH previously  . Pulmonary embolism ([place]) 04/2003   Hx of  . Rectal bleeding   . Sinus node dysfunction ([facility])    s/p MDT PPM - AAI - V port  plugged  . Sleep apnea    uses CPAP [facility] SLEEP CTR 2 YRS  . Systolic and diastolic CHF, chronic ([place])    EF 35% by most recent echo 09/19/10  . 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   recent ly had tooth pulled painful    Patient Active Problem List   Diagnosis Date Noted  . Acute on chronic diastolic congestive heart failure, NYHA class 4 ([place]) 04/03/2017  . Back pain 11/12/2015  . Scrotal varicose veins 07/11/2015  . High risk medication use - Tikosyn 09/16/2014  . Chronic anticoagulation-Pradaxa 09/13/2014  . Muscle twitch 09/11/2014  . Cramps of lower extremity 09/11/2014  . Irritant contact dermatitis 10/03/2013  . Eunuchoidism 10/03/2013  . Mixed, or nondependent drug abuse, in remission 09/13/2013  . Spinal stenosis, lumbar region, with neurogenic claudication 05/23/2013  . CAD -nonobstructive   . Preoperative cardiovascular examination 11/17/2012  . Pulmonary embolism ([place]) 09/02/2012  . Acid reflux 09/02/2012  . Adiposity 02/12/2012  . Cervical disc herniation 12/15/2011  . Allergic rhinitis 09/21/2011  . Atrial fibrillation with RVR ([place]) 07/08/2011  . Discomfort 06/19/2011  . ED (erectile dysfunction) of organic origin 06/19/2011  . Lichen planus 25/00/3704  . Decreased libido 06/19/2011  . Essential hypertension 04/14/2011  . Dermatitis, eczematoid 02/26/2011  . 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  .  Degeneration of intervertebral disc of lumbosacral region 01/06/2011  . Chronic atrial fibrillation ([place]) 01/06/2011  . GRAVES' DISEASE 10/30/2010  . CARPAL TUNNEL SYNDROME, LEFT 10/16/2010  . SICK SINUS/ TACHY-BRADY SYNDROME 10/16/2010  . Cardiomyopathy, ischemic 09/21/2010  . HLD (hyperlipidemia) 09/16/2010  . PPM-Medtronic- 2009 06/27/2010  . DYSPNEA 06/06/2010  . Abnormal cardiac CT angiography 06/06/2010  . Seasonal and perennial allergic rhinitis 02/05/2010  . OTHER CHEST PAIN 11/27/2009  . ERECTILE DYSFUNCTION, ORGANIC 07/18/2009  . Obstructive sleep apnea- on C-pap 07/08/2009  .  COMBINED HEART FAILURE, CHRONIC 05/30/2009  . LUMBAR RADICULOPATHY, RIGHT 11/05/2008  . HIATAL HERNIA 07/04/2008  . HYPERTHYROIDISM 08/30/2007  . Dyslipidemia 08/30/2007  . ALCOHOL ABUSE 08/30/2007  . DEPRESSION 08/30/2007  . PAF (paroxysmal atrial fibrillation) ([place]) 08/30/2007  . GERD 08/30/2007  . PULMONARY EMBOLISM, HX OF 08/30/2007  . DRUG ABUSE, HX OF 08/30/2007    Past Surgical History:  Procedure Laterality Date  . ANTERIOR CERVICAL DECOMP/DISCECTOMY FUSION  12/15/2011   Procedure: ANTERIOR CERVICAL DECOMPRESSION/DISCECTOMY FUSION 1 LEVEL;  Surgeon: [name], MD;  Location: [facility];  Service: Neurosurgery;  Laterality: N/A;  Cervical Six-Seven Anterior Cervical Diskectomy fusion with Allograft and Plating  . ATRIAL ABLATION SURGERY  12/2010   afib ablation by [name]  . BACK SURGERY    . CARDIAC CATHETERIZATION     CADIAC ABLATION [name] 12/2010  . CARDIAC CATHETERIZATION N/A 05/08/2016   Procedure: Left Heart Cath and Coronary Angiography;  Surgeon: Peter M Martinique, MD;  Location: [facility];  Service: Cardiovascular;  Laterality: N/A;  . Fingers removed from right hand.  2/84   traumatic work injury  . INSERT / REPLACE / REMOVE PACEMAKER     05/2008    . LUMBAR SPINE SURGERY  02/2009   Dr. Trenton Gammon  . PACEMAKER INSERTION  10/09   by Dr [name]       Home Medications    Prior to Admission medications   Medication Sig Start Date End Date Taking? Authorizing Provider  apixaban (ELIQUIS) 5 MG TABS tablet Take 1 tablet (5 mg total) by mouth 2 (two) times daily. 09/07/16  Yes Allred, Jeneen Rinks, MD  carvedilol (COREG) 25 MG tablet Take 1 tablet (25 mg total) by mouth 2 (two) times daily with a meal. 05/06/16  Yes Allred, Jeneen Rinks, MD  clobetasol cream (TEMOVATE) 3.55 % Apply 1 application topically 2 (two) times daily.   Yes [provider]  desonide (DESOWEN) 0.05 % cream Apply 1 application topically 2 (two) times daily.   Yes [provider]    dofetilide (TIKOSYN) 500 MCG capsule TAKE 1 CAPSULE (500 MCG TOTAL) BY MOUTH 2 (TWO) TIMES DAILY. 04/01/17  Yes Deboraha Sprang, MD  gabapentin (NEURONTIN) 100 MG capsule Take 100 mg by mouth 2 (two) times daily.    Yes [provider]  ibuprofen (ADVIL,MOTRIN) 200 MG tablet Take 800 mg by mouth every 6 (six) hours as needed for headache (back pain).   Yes [provider]  methimazole (TAPAZOLE) 10 MG tablet Take 1 tablet (10 mg total) by mouth daily. 11/06/14  Yes Renato Shin, MD  omeprazole (PRILOSEC) 40 MG capsule Take 40 mg by mouth 2 (two) times daily.    Yes [provider]  oxyCODONE-acetaminophen (PERCOCET) 10-325 MG per tablet Take 1 tablet by mouth every 4 (four) hours as needed for pain. 05/29/13  Yes Antonietta Breach, PA-C  simvastatin (ZOCOR) 40 MG tablet TAKE 1 TABLET (40 MG TOTAL) BY MOUTH DAILY.  11/28/14  Yes Nahser, Wonda Cheng, MD  triamcinolone ointment (KENALOG) 0.1 % Apply 1 application topically 2 (two) times daily.  02/24/17  Yes [provider]    Family History Family History  Problem Relation Age of Onset  . Heart disease Mother   . Breast cancer Mother   . Lung cancer Father   . Diabetes Unknown   . Colon cancer Neg Hx     Social History Social History  Substance Use Topics  . Smoking status: Former Smoker    Packs/day: 1.00    Years: 15.00    Types: Cigarettes    Quit date: 04/04/2003  . Smokeless tobacco: Never Used  . Alcohol use No     Comment: formerly heavy ETOH,  now drinks a fifth of liquor every 2 weeks     Allergies   Potassium chloride   Review of Systems Review of Systems  A complete review of systems was obtained and all systems are negative except as noted in the HPI and PMH.    Physical Exam Updated Vital Signs BP 128/80 (BP Location: Right Arm)   Pulse 89   Temp 98.1 F (36.7 C) (Oral)   Resp 16   SpO2 100%   Physical Exam  Constitutional: He is oriented to person, place, and time. He appears  well-developed and well-nourished. No distress.  HENT:  Head: Normocephalic and atraumatic.  Mouth/Throat: Oropharynx is clear and moist.  Eyes: Pupils are equal, round, and reactive to light. Conjunctivae and EOM are normal.  Neck: Normal range of motion. No JVD present. No tracheal deviation present.  Cardiovascular: Normal rate, regular rhythm and intact distal pulses.   Radial pulse equal bilaterally  Pulmonary/Chest: Effort normal and breath sounds normal. No stridor. No respiratory distress. He has no wheezes. He has no rales. He exhibits no tenderness.  Abdominal: Soft. He exhibits no distension and no mass. There is no tenderness. There is no rebound and no guarding.  Musculoskeletal: Normal range of motion. He exhibits tenderness. He exhibits no edema.       Arms: Left upper extremity with no swelling, warmth, induration or skin changes. Radial pulse is 2+, patient can supinate and pronate without complication. Tender to palpation as diagrammed, this also send pain shooting down into the wrist.  Neurological: He is alert and oriented to person, place, and time.  Skin: Skin is warm. He is not diaphoretic.  Psychiatric: He has a normal mood and affect.  Nursing note and vitals reviewed.    ED Treatments / Results  Labs (all labs ordered are listed, but only abnormal results are displayed) Labs Reviewed  BASIC METABOLIC PANEL - Abnormal; Notable for the following:       Result Value   Chloride 100 (*)    Glucose, Bld 102 (*)    Creatinine, Ser 1.41 (*)    GFR calc non Af Amer 54 (*)    All other components within normal limits  CBC - Abnormal; Notable for the following:    RBC 6.28 (*)    Hemoglobin 17.1 (*)    RDW 16.1 (*)    All other components within normal limits  I-STAT TROPONIN, ED  I-STAT TROPONIN, ED    EKG  EKG Interpretation  Date/Time:  Monday April 05 2017 11:57:10 EDT Ventricular Rate:  70 PR Interval:  160 QRS Duration: 100 QT Interval:  426 QTC  Calculation: 460 R Axis:   -39 Text Interpretation:  Sinus rhythm with Premature atrial complexes Left axis  deviation Nonspecific ST and T wave abnormality Prolonged QT Abnormal ECG Artifact Confirmed by Fredia Sorrow [phone]) on 04/05/2017 4:55:45 PM       Radiology Dg Chest 2 View  Result Date: 04/05/2017 CLINICAL DATA:  Chest pain and left arm pain. EXAM: CHEST  2 VIEW COMPARISON:  11/12/2015 FINDINGS: The heart size and pulmonary vascularity are normal and the lungs are clear. Single lead pacemaker in place. No acute bone abnormality. Calcification in the arch of the aorta. IMPRESSION: No active cardiopulmonary disease. Aortic atherosclerosis. Electronically Signed   By: Lorriane Shire M.D.   On: 04/05/2017 12:28    Procedures Procedures (including critical care time)  Medications Ordered in ED Medications  ibuprofen (ADVIL,MOTRIN) tablet 400 mg (400 mg Oral Given 04/05/17 1616)     Initial Impression / Assessment and Plan / ED Course  I have reviewed the triage vital signs and the nursing notes.  Pertinent labs & imaging results that were available during my care of the patient were reviewed by me and considered in my medical decision making (see chart for details).     Vitals:   04/05/17 1445 04/05/17 1530 04/05/17 1600 04/05/17 1715  BP: (!) 138/98 (!) 154/81 121/86 128/80  Pulse: (!) 54 67 (!) 57 89  Resp: 18 17 15 16   Temp:    98.1 F (36.7 C)  TempSrc:    Oral  SpO2: 100% 94% 99% 100%    Medications  ibuprofen (ADVIL,MOTRIN) tablet 400 mg (400 mg Oral Given 04/05/17 1616)    [name] is 56 y.o. male presenting with Focal left forearm pain. Pain is reproducible on palpation of brachial radialis. This is likely radiculopathy secondary to mild swelling from IV, patient was just in the hospital and was discharged yesterday. Although he does states the arm faint pain feels similar to prior MRI, at prior MRI was associated with chest pain and shortness of breath  which he is not experiencing here today. EKG is unchanged, troponin negative. Patient given ibuprofen and will check a delta troponin.  Delta troponin negative, discussed with attending physician who agrees with care plan and stability to discharge to home.  Evaluation does not show pathology that would require ongoing emergent intervention or inpatient treatment. Pt is hemodynamically stable and mentating appropriately. Discussed findings and plan with patient/guardian, who agrees with care plan. All questions answered. Return precautions discussed and outpatient follow up given.      Final Clinical Impressions(s) / ED Diagnoses   Final diagnoses:  Left arm pain    New Prescriptions Discharge Medication List as of 04/05/2017  4:56 PM       Rayane Gallardo, Charna Elizabeth 04/06/17 1033    Fredia Sorrow, MD 04/09/17 [phone]

## 2017-04-05 NOTE — Discharge Instructions (Signed)
For pain control please take Ibuprofen (also known as Motrin or Advil) 400mg (this is normally 2 over the counter pills) every 6 hours. Take with food to minimize stomach irritation. ° °Please follow with your primary care doctor in the next 2 days for a check-up. They must obtain records for further management.  ° °Do not hesitate to return to the Emergency Department for any new, worsening or concerning symptoms.  ° °

## 2017-04-05 NOTE — ED Triage Notes (Signed)
Pt presents for evaluation of L arm throbbing reports recently dc for afib admission. Patient denies CP/SOB but reports this feels like when he had a heart attack. Weak and dizzy today.

## 2017-04-07 ENCOUNTER — Other Ambulatory Visit (HOSPITAL_COMMUNITY): Payer: Self-pay | Admitting: *Deleted

## 2017-04-07 ENCOUNTER — Inpatient Hospital Stay (HOSPITAL_COMMUNITY): Admission: RE | Admit: 2017-04-07 | Payer: Medicaid Other | Source: Ambulatory Visit | Admitting: Nurse Practitioner

## 2017-04-07 ENCOUNTER — Encounter (HOSPITAL_COMMUNITY): Payer: Self-pay | Admitting: *Deleted

## 2017-04-07 DIAGNOSIS — I4819 Other persistent atrial fibrillation: Secondary | ICD-10-CM

## 2017-04-07 NOTE — Progress Notes (Signed)
bc

## 2017-04-26 ENCOUNTER — Encounter: Payer: Self-pay | Admitting: Gastroenterology

## 2017-04-26 ENCOUNTER — Ambulatory Visit (INDEPENDENT_AMBULATORY_CARE_PROVIDER_SITE_OTHER): Payer: Medicaid Other | Admitting: Gastroenterology

## 2017-04-26 VITALS — BP 128/86 | HR 73 | Ht 71.0 in | Wt 274.8 lb

## 2017-04-26 DIAGNOSIS — K219 Gastro-esophageal reflux disease without esophagitis: Secondary | ICD-10-CM

## 2017-04-26 DIAGNOSIS — I4891 Unspecified atrial fibrillation: Secondary | ICD-10-CM

## 2017-04-26 MED ORDER — DEXLANSOPRAZOLE 60 MG PO CPDR: 60.0000 mg | DELAYED_RELEASE_CAPSULE | Freq: Every day | ORAL | 3 refills | Status: DC

## 2017-04-26 MED ORDER — SUCRALFATE 1 GM/10ML PO SUSP: 1.0000 g | Freq: Four times a day (QID) | ORAL | 3 refills | Status: DC

## 2017-04-26 NOTE — Progress Notes (Signed)
HPI :  56 y/o male with history of AF, CAD, CHF, PE, OSA, pacemaker in place, on Eliquis, here for a new patient visit for assessment of reflux.  He has previously been seen by Dr. Sharlett Iles.  He has reflux symptoms longstanding. He has heartburn and regurgitation . He is taking omeprazole 40mg  BID - he takes it 7 AM and 7PM prior to meals. This regimen previously worked quite well for his symptoms, however more recently it is not working well at all . He feels breakthrough reflux most days. He reports during the day it bothers him most. Drinking fluids makes it worse. He denies any dysphagia. Taking peptobismol tablets which can help at times. He has been on protonix previously which did not help. No abdominal pains routinely, rarely can feel it in the upper abdomen. He takes motrin for back pain - he does this to avoid taking percocet. No FH of esophageal cancer.  He is scheduled for TEE in the next few weeks with plan for atrial fibrillation ablation on Sept 6th. Followed closely by cardiology.  Prior workup: Colonoscopy 01/22/2010 - hemorrhoids, otherwise normal EGD 11/02/2007 - distal esophagitis Echo 03/2016 - EF 40-45%   Past Medical History:  Diagnosis Date  . Atrial fibrillation ([place])    s/p afib ablation  PVI 5/12 JA  . CAD -nonobstructive    S/P nstemi-type II cath 05/2008 - nonobs. dzs.  . DDD (degenerative disc disease)    evaluate by neurosurgery is ongoing  . Depression    ptsd  . Erectile dysfunction   . Headache(784.0)   . Hemorrhoids   . HTN (hypertension)   . Hyperlipemia   . Hyperthyroidism    Graves dz on methimazole and followed by [name]  . Obesity   . Pancreatitis   . Polysubstance abuse    cocaine, last used 1999  . PUD (peptic ulcer disease)    gastritis due to ETOH previously  . Pulmonary embolism ([place]) 04/2003   Hx of  . Rectal bleeding   . Sinus node dysfunction ([facility])    s/p MDT PPM - AAI - V port plugged  . Sleep apnea    uses CPAP [facility] SLEEP  CTR 2 YRS  . Systolic and diastolic CHF, chronic ([place])    EF 35% by most recent echo 09/19/10  . 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 ANTERIOR CERVICAL DECOMP/DISCECTOMY FUSION  12/15/2011   Procedure: ANTERIOR CERVICAL DECOMPRESSION/DISCECTOMY FUSION 1 LEVEL;  Surgeon: [name], MD;  Location: [facility];  Service: Neurosurgery;  Laterality: N/A;  Cervical Six-Seven Anterior Cervical Diskectomy fusion with Allograft and Plating  . ATRIAL ABLATION SURGERY  12/2010   afib ablation by [name]  . BACK SURGERY    . CARDIAC CATHETERIZATION     CADIAC ABLATION [name] 12/2010  . CARDIAC CATHETERIZATION N/A 05/08/2016   Procedure: Left Heart Cath and Coronary Angiography;  Surgeon: Peter M Martinique, MD;  Location: [facility];  Service: Cardiovascular;  Laterality: N/A;  . Fingers removed from right hand.  2/84   traumatic work injury  . INSERT / REPLACE / REMOVE PACEMAKER     05/2008    . LUMBAR SPINE SURGERY  02/2009   Dr. Trenton Gammon  . PACEMAKER INSERTION  10/09   by Dr [name]   Family History  Problem Relation Age of Onset  . Heart disease Mother   .  Breast cancer Mother   . Lung cancer Father   . Diabetes Unknown   . Colon cancer Neg Hx    Social History  Substance Use Topics  . Smoking status: Former Smoker    Packs/day: 1.00    Years: 15.00    Types: Cigarettes    Quit date: 04/04/2003  . Smokeless tobacco: Never Used  . Alcohol use No     Comment: formerly heavy ETOH,  now drinks a fifth of liquor every 2 weeks   Current Outpatient Prescriptions  Medication Sig Dispense Refill  . apixaban (ELIQUIS) 5 MG TABS tablet Take 1 tablet (5 mg total) by mouth 2 (two) times daily. 60 tablet 11  . carvedilol (COREG) 25 MG tablet Take 1 tablet (25 mg total) by mouth 2 (two) times daily with a meal. 180 tablet 11  . clobetasol cream (TEMOVATE) 8.29 % Apply 1 application topically 2 (two) times daily.      [name] desonide (DESOWEN) 0.05 % cream Apply 1 application topically 2 (two) times daily.    [name] dofetilide (TIKOSYN) 500 MCG capsule TAKE 1 CAPSULE (500 MCG TOTAL) BY MOUTH 2 (TWO) TIMES DAILY. 180 capsule 3  . gabapentin (NEURONTIN) 100 MG capsule Take 100 mg by mouth 2 (two) times daily.     [name] ibuprofen (ADVIL,MOTRIN) 200 MG tablet Take 800 mg by mouth every 6 (six) hours as needed for headache (back pain).    . methimazole (TAPAZOLE) 10 MG tablet Take 1 tablet (10 mg total) by mouth daily. 30 tablet 1  . omeprazole (PRILOSEC) 40 MG capsule Take 40 mg by mouth 2 (two) times daily.     [name] oxyCODONE-acetaminophen (PERCOCET) 10-325 MG per tablet Take 1 tablet by mouth every 4 (four) hours as needed for pain. 29 tablet 0  . simvastatin (ZOCOR) 40 MG tablet TAKE 1 TABLET (40 MG TOTAL) BY MOUTH DAILY. 30 tablet 11  . triamcinolone ointment (KENALOG) 0.1 % Apply 1 application topically 2 (two) times daily.   3   No current facility-administered medications for this visit.    Allergies  Allergen Reactions  . Potassium Chloride Other (See Comments)    Dizziness and nausea      Review of Systems: All systems reviewed and negative except where noted in HPI.    Dg Chest 2 View  Result Date: 04/05/2017 CLINICAL DATA:  Chest pain and left arm pain. EXAM: CHEST  2 VIEW COMPARISON:  11/12/2015 FINDINGS: The heart size and pulmonary vascularity are normal and the lungs are clear. Single lead pacemaker in place. No acute bone abnormality. Calcification in the arch of the aorta. IMPRESSION: No active cardiopulmonary disease. Aortic atherosclerosis. Electronically Signed   By: Lorriane Shire M.D.   On: 04/05/2017 12:28   Lab Results  Component Value Date   WBC 5.4 04/05/2017   HGB 17.1 (H) 04/05/2017   HCT 49.6 04/05/2017   MCV 79.0 04/05/2017   PLT 234 04/05/2017    Lab Results  Component Value Date   CREATININE 1.41 (H) 04/05/2017   BUN 20 04/05/2017   NA 136 04/05/2017   K 4.3 04/05/2017   CL  100 (L) 04/05/2017   CO2 22 04/05/2017    Lab Results  Component Value Date   ALT 23 04/04/2017   AST 26 04/04/2017   ALKPHOS 75 04/04/2017   BILITOT 0.9 04/04/2017      Physical Exam: BP 128/86 (BP Location: Left Arm, Patient Position: Sitting, Cuff Size: Large)   Pulse 73  Ht 5\' 11"  (1.803 m)   Wt 274 lb 12.8 oz (124.6 kg)   SpO2 97%   BMI 38.33 kg/m  Constitutional: Pleasant,well-developed, male in no acute distress. HEENT: Normocephalic and atraumatic. Conjunctivae are normal. No scleral icterus. Neck supple.  Cardiovascular: irregularly irregular Pulmonary/chest: Effort normal and breath sounds normal. No wheezing, rales or rhonchi. Abdominal: Soft, nondistended, nontender.  There are no masses palpable. No hepatomegaly. Extremities: no edema, missing digits on R hand Lymphadenopathy: No cervical adenopathy noted. Neurological: Alert and oriented to person place and time. Skin: Skin is warm and dry. No rashes noted. Psychiatric: Normal mood and affect. Behavior is normal.   ASSESSMENT AND PLAN: 56 year old male with multiple comorbidities on anticoagulation, here for assessment for poorly controlled reflux. He has long-standing reflux history, with remote EGD showing esophagitis. He has been on omeprazole historically with fair control symptoms, however interval worsening and now with daily frequent breakthrough.   I do think he warrants an EGD for persistent reflux and history of esophagitis, rule out Barrett's esophagus, however she is scheduled for a TEE and atrial fibrillation ablation therapy in the next 1-2 weeks. I would prefer that he gets that particular issue addressed first prior to proceeding with upper endoscopy. He agreed with the plan. In the interim we'll stop omeprazole, place him on Dexalone 60 mg once daily for 30 day trial and see if this helps. I also provided him liquid Carafate to use every 6 hours as needed for breakthrough. He will contact me in a  few weeks and now is doing, and at that time will touch base with cardiology to see when he will be cleared for EGD.  Enterprise Cellar, MD [facility] Pager [phone]

## 2017-04-26 NOTE — Patient Instructions (Signed)
We have sent the following medications to your pharmacy for you to pick up at your convenience: Dexilant 60mg . Take daily  Liquid Carafate Take 10cc every 6 hours as needed for reflux  Stop taking Omeprazole    If you are age 58 or older, your body mass index should be between 23-30. Your Body mass index is 38.33 kg/m. If this is out of the aforementioned range listed, please consider follow up with your Primary Care Provider.  If you are age 58 or younger, your body mass index should be between 19-25. Your Body mass index is 38.33 kg/m. If this is out of the aformentioned range listed, please consider follow up with your Primary Care Provider.

## 2017-04-27 ENCOUNTER — Telehealth: Payer: Self-pay

## 2017-04-27 NOTE — Telephone Encounter (Signed)
We were notified today that [facility] requires prior authorization by pt's Medicaid insurance. CoverMyMeds website directed use of [employer] for prior authorization. Completed form and faxed to [facility] at [phone].Pt has been on omeprazole but is having interval worsening with daily breakthroughs. Also, multiple comorbidities on anticoagulants; having Afib ablation in next 1 to 2 weeks. I called the pt and communicated that we were working on pre auth.  He expressed understanding.

## 2017-04-28 ENCOUNTER — Other Ambulatory Visit: Payer: Medicaid Other | Admitting: *Deleted

## 2017-04-28 DIAGNOSIS — I4819 Other persistent atrial fibrillation: Secondary | ICD-10-CM

## 2017-04-28 LAB — CBC WITH DIFFERENTIAL/PLATELET
BASOS ABS: 0 10*3/uL (ref 0.0–0.2)
Basos: 0 %
EOS (ABSOLUTE): 0.1 10*3/uL (ref 0.0–0.4)
Eos: 3 %
HEMOGLOBIN: 14.9 g/dL (ref 13.0–17.7)
Hematocrit: 44.2 % (ref 37.5–51.0)
IMMATURE GRANULOCYTES: 0 %
Immature Grans (Abs): 0 10*3/uL (ref 0.0–0.1)
LYMPHS ABS: 2.1 10*3/uL (ref 0.7–3.1)
Lymphs: 47 %
MCH: 27.2 pg (ref 26.6–33.0)
MCHC: 33.7 g/dL (ref 31.5–35.7)
MCV: 81 fL (ref 79–97)
MONOCYTES: 11 %
Monocytes Absolute: 0.5 10*3/uL (ref 0.1–0.9)
NEUTROS PCT: 39 %
Neutrophils Absolute: 1.7 10*3/uL (ref 1.4–7.0)
Platelets: 223 10*3/uL (ref 150–379)
RBC: 5.48 x10E6/uL (ref 4.14–5.80)
RDW: 16.9 % — AB (ref 12.3–15.4)
WBC: 4.5 10*3/uL (ref 3.4–10.8)

## 2017-04-28 LAB — BASIC METABOLIC PANEL
BUN / CREAT RATIO: 16 (ref 9–20)
BUN: 17 mg/dL (ref 6–24)
CALCIUM: 9.3 mg/dL (ref 8.7–10.2)
CHLORIDE: 104 mmol/L (ref 96–106)
CO2: 20 mmol/L (ref 20–29)
Creatinine, Ser: 1.06 mg/dL (ref 0.76–1.27)
GFR calc Af Amer: 90 mL/min/{1.73_m2} (ref >59)
GFR calc non Af Amer: 78 mL/min/{1.73_m2} (ref >59)
GLUCOSE: 79 mg/dL (ref 65–99)
Potassium: 4.3 mmol/L (ref 3.5–5.2)
Sodium: 140 mmol/L (ref 134–144)

## 2017-04-28 NOTE — Addendum Note (Signed)
Addended by: Eulis Foster on: 04/28/2017 11:30 AM   Modules accepted: Orders

## 2017-05-05 ENCOUNTER — Encounter (HOSPITAL_COMMUNITY): Payer: Self-pay | Admitting: *Deleted

## 2017-05-05 ENCOUNTER — Ambulatory Visit (HOSPITAL_COMMUNITY)
Admission: RE | Admit: 2017-05-05 | Discharge: 2017-05-05 | Disposition: A | Discharge status: Home / Self Care | Payer: Medicaid Other | Source: Ambulatory Visit | Attending: Cardiology | Admitting: Cardiology

## 2017-05-05 ENCOUNTER — Encounter (HOSPITAL_COMMUNITY)
Admission: RE | Disposition: A | Discharge status: Home / Self Care | Payer: Self-pay | Source: Ambulatory Visit | Attending: Cardiology

## 2017-05-05 ENCOUNTER — Other Ambulatory Visit (HOSPITAL_COMMUNITY): Payer: Self-pay | Admitting: *Deleted

## 2017-05-05 ENCOUNTER — Ambulatory Visit (HOSPITAL_BASED_OUTPATIENT_CLINIC_OR_DEPARTMENT_OTHER): Payer: Medicaid Other

## 2017-05-05 DIAGNOSIS — Z950 Presence of cardiac pacemaker: Secondary | ICD-10-CM | POA: Insufficient documentation

## 2017-05-05 DIAGNOSIS — Z981 Arthrodesis status: Secondary | ICD-10-CM | POA: Diagnosis not present

## 2017-05-05 DIAGNOSIS — Z9889 Other specified postprocedural states: Secondary | ICD-10-CM | POA: Insufficient documentation

## 2017-05-05 DIAGNOSIS — Z888 Allergy status to other drugs, medicaments and biological substances status: Secondary | ICD-10-CM | POA: Diagnosis not present

## 2017-05-05 DIAGNOSIS — E785 Hyperlipidemia, unspecified: Secondary | ICD-10-CM | POA: Insufficient documentation

## 2017-05-05 DIAGNOSIS — Z7901 Long term (current) use of anticoagulants: Secondary | ICD-10-CM | POA: Insufficient documentation

## 2017-05-05 DIAGNOSIS — I110 Hypertensive heart disease with heart failure: Secondary | ICD-10-CM | POA: Insufficient documentation

## 2017-05-05 DIAGNOSIS — I4891 Unspecified atrial fibrillation: Secondary | ICD-10-CM

## 2017-05-05 DIAGNOSIS — Z86711 Personal history of pulmonary embolism: Secondary | ICD-10-CM | POA: Diagnosis not present

## 2017-05-05 DIAGNOSIS — I2510 Atherosclerotic heart disease of native coronary artery without angina pectoris: Secondary | ICD-10-CM | POA: Diagnosis not present

## 2017-05-05 DIAGNOSIS — Z8719 Personal history of other diseases of the digestive system: Secondary | ICD-10-CM | POA: Diagnosis not present

## 2017-05-05 DIAGNOSIS — I252 Old myocardial infarction: Secondary | ICD-10-CM | POA: Diagnosis not present

## 2017-05-05 DIAGNOSIS — I480 Paroxysmal atrial fibrillation: Secondary | ICD-10-CM | POA: Insufficient documentation

## 2017-05-05 DIAGNOSIS — G4733 Obstructive sleep apnea (adult) (pediatric): Secondary | ICD-10-CM | POA: Insufficient documentation

## 2017-05-05 DIAGNOSIS — E669 Obesity, unspecified: Secondary | ICD-10-CM | POA: Diagnosis not present

## 2017-05-05 DIAGNOSIS — Z801 Family history of malignant neoplasm of trachea, bronchus and lung: Secondary | ICD-10-CM | POA: Insufficient documentation

## 2017-05-05 DIAGNOSIS — Z79899 Other long term (current) drug therapy: Secondary | ICD-10-CM | POA: Diagnosis not present

## 2017-05-05 DIAGNOSIS — E0500 Thyrotoxicosis with diffuse goiter without thyrotoxic crisis or storm: Secondary | ICD-10-CM | POA: Diagnosis not present

## 2017-05-05 DIAGNOSIS — Z803 Family history of malignant neoplasm of breast: Secondary | ICD-10-CM | POA: Insufficient documentation

## 2017-05-05 DIAGNOSIS — Z87891 Personal history of nicotine dependence: Secondary | ICD-10-CM | POA: Diagnosis not present

## 2017-05-05 DIAGNOSIS — Z89021 Acquired absence of right finger(s): Secondary | ICD-10-CM | POA: Diagnosis not present

## 2017-05-05 DIAGNOSIS — F4310 Post-traumatic stress disorder, unspecified: Secondary | ICD-10-CM | POA: Diagnosis not present

## 2017-05-05 DIAGNOSIS — Z8711 Personal history of peptic ulcer disease: Secondary | ICD-10-CM | POA: Insufficient documentation

## 2017-05-05 DIAGNOSIS — I4819 Other persistent atrial fibrillation: Secondary | ICD-10-CM

## 2017-05-05 DIAGNOSIS — Z8249 Family history of ischemic heart disease and other diseases of the circulatory system: Secondary | ICD-10-CM | POA: Diagnosis not present

## 2017-05-05 DIAGNOSIS — F329 Major depressive disorder, single episode, unspecified: Secondary | ICD-10-CM | POA: Diagnosis not present

## 2017-05-05 DIAGNOSIS — I5042 Chronic combined systolic (congestive) and diastolic (congestive) heart failure: Secondary | ICD-10-CM | POA: Diagnosis not present

## 2017-05-05 DIAGNOSIS — Z79891 Long term (current) use of opiate analgesic: Secondary | ICD-10-CM | POA: Insufficient documentation

## 2017-05-05 HISTORY — PX: TEE WITHOUT CARDIOVERSION: SHX5443

## 2017-05-05 SURGERY — ECHOCARDIOGRAM, TRANSESOPHAGEAL
Anesthesia: Moderate Sedation

## 2017-05-05 MED ORDER — SODIUM CHLORIDE 0.9 % IV SOLN
INTRAVENOUS | Status: DC
  Administered 2017-05-05: 10:00:00 via INTRAVENOUS

## 2017-05-05 MED ORDER — MIDAZOLAM HCL 5 MG/ML IJ SOLN
INTRAMUSCULAR | Status: AC
  Filled 2017-05-05: qty 2

## 2017-05-05 MED ORDER — BUTAMBEN-TETRACAINE-BENZOCAINE 2-2-14 % EX AERO
INHALATION_SPRAY | CUTANEOUS | Status: DC | PRN
Start: 2017-05-05 — End: 2017-05-05
  Administered 2017-05-05: 2 via TOPICAL

## 2017-05-05 MED ORDER — FENTANYL CITRATE (PF) 100 MCG/2ML IJ SOLN
INTRAMUSCULAR | Status: AC
  Filled 2017-05-05: qty 2

## 2017-05-05 MED ORDER — FENTANYL CITRATE (PF) 100 MCG/2ML IJ SOLN
INTRAMUSCULAR | Status: DC | PRN
  Administered 2017-05-05: 50 ug via INTRAVENOUS

## 2017-05-05 MED ORDER — MIDAZOLAM HCL 10 MG/2ML IJ SOLN
INTRAMUSCULAR | Status: DC | PRN
Start: 2017-05-05 — End: 2017-05-05
  Administered 2017-05-05: 2 mg via INTRAVENOUS
  Administered 2017-05-05: 1 mg via INTRAVENOUS

## 2017-05-05 NOTE — Discharge Instructions (Signed)

## 2017-05-05 NOTE — Anesthesia Preprocedure Evaluation (Addendum)
Anesthesia Evaluation  Patient identified by MRN, date of birth, ID band Patient awake    Reviewed: Allergy & Precautions, NPO status , Patient's Chart, lab work & pertinent test results, reviewed documented beta blocker date and time   History of Anesthesia Complications Negative for: history of anesthetic complications  Airway Mallampati: II  TM Distance: >3 FB Neck ROM: Full    Dental  (+) Edentulous Upper, Poor Dentition, Missing, Dental Advisory Given   Pulmonary shortness of breath, sleep apnea and Continuous Positive Airway Pressure Ventilation , former smoker (quit 2004),    breath sounds clear to auscultation       Cardiovascular hypertension, Pt. on medications and Pt. 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)  + dysrhythmias Atrial Fibrillation + pacemaker  Rhythm:Irregular Rate:Normal  8/17 ECHO: EF 40-45%, valves OK   Neuro/Psych Depression negative neurological ROS     GI/Hepatic Neg liver ROS, GERD  Poorly Controlled,  Endo/Other  Morbid obesity  Renal/GU negative Renal ROS     Musculoskeletal   Abdominal (+) + obese,   Peds  Hematology eliquis   Anesthesia Other Findings   Reproductive/Obstetrics                            Anesthesia Physical Anesthesia Plan  ASA: III  Anesthesia Plan: General   Post-op Pain Management:    Induction: Intravenous  PONV Risk Score and Plan: 2 and Ondansetron and Dexamethasone  Airway Management Planned: Oral ETT  Additional Equipment:   Intra-op Plan:   Post-operative Plan: Extubation in OR  Informed Consent: I have reviewed the patients History and Physical, chart, labs and discussed the procedure including the risks, benefits and alternatives for the proposed anesthesia with the patient or authorized representative who has indicated his/her understanding and acceptance.   Dental advisory given  Plan  Discussed with: CRNA and Surgeon  Anesthesia Plan Comments: (Plan routine monitors, GETA)        Anesthesia Quick Evaluation

## 2017-05-05 NOTE — H&P (View-Only) (Signed)
Primary Care Physician: [facility] [place] Referring Physician: Dr. Rayann Heman Referring: [facility] f/u   [name] is a 56 y.o. male  With h/o s/p afib ablation by Dr. Rayann Heman in 2012.  He  t continues to have atrial arrhythmias.  He has atrial flutter/ atach./afib with very fast ventricular rates and symptoms of presyncope.  He feels that episodes have worsened. He is on tikosyn without relief.He has had  LHC in 2017. He did not have any obstructive lesions, but did have a NSTEMI in 2009.  Medical therapy was advised. He was scheduled for ablation in past but  cancelled procedure.  He was admitted to hospital 8/4 thru 8/5 with acute on chronic diastolic HF and Afib with RVR. He was suppose to f/u in afib clinic on Wednesday, to discuss options going forward. However, he walked into clinic today c/o left arm pain, that started on awakening this am, that feels the same as when he had his heart attack. EKG shows SR,non specific ST changes. No sweating or shortness of breath. Nothing in history of yesterday PM that would have triggered arm discomfort. He does state that he is now ready to have ablation.  Today, he denies symptoms of palpitations, chest pain, shortness of breath, orthopnea, PND, lower extremity edema, dizziness, presyncope, syncope, or neurologic sequela. Positive for left arm pain. The patient is tolerating medications without difficulties and is otherwise without complaint today.   Past Medical History:  Diagnosis Date  . Atrial fibrillation ([place])    s/p afib ablation  PVI 5/12 JA  . CAD -nonobstructive    S/P nstemi-type II cath 05/2008 - nonobs. dzs.  . DDD (degenerative disc disease)    evaluate by neurosurgery is ongoing  . Depression    ptsd  . Erectile dysfunction   . Headache(784.0)   . Hemorrhoids   . HTN (hypertension)   . Hyperlipemia   . Hyperthyroidism    Graves dz on methimazole and followed by [name]  . Obesity   . Pancreatitis   .  Polysubstance abuse    cocaine, last used 1999  . PUD (peptic ulcer disease)    gastritis due to ETOH previously  . Pulmonary embolism ([place]) 04/2003   Hx of  . Rectal bleeding   . Sinus node dysfunction ([facility])    s/p MDT PPM - AAI - V port plugged  . Sleep apnea    uses CPAP [facility] SLEEP CTR 2 YRS  . Systolic and diastolic CHF, chronic ([place])    EF 35% by most recent echo 09/19/10  . 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 ANTERIOR CERVICAL DECOMP/DISCECTOMY FUSION  12/15/2011   Procedure: ANTERIOR CERVICAL DECOMPRESSION/DISCECTOMY FUSION 1 LEVEL;  Surgeon: [name], MD;  Location: [place] [facility];  Service: Neurosurgery;  Laterality: N/A;  Cervical Six-Seven Anterior Cervical Diskectomy fusion with Allograft and Plating  . ATRIAL ABLATION SURGERY  12/2010   afib ablation by [name]  . BACK SURGERY    . CARDIAC CATHETERIZATION     CADIAC ABLATION [name] 12/2010  . CARDIAC CATHETERIZATION N/A 05/08/2016   Procedure: Left Heart Cath and Coronary Angiography;  Surgeon: Peter M Martinique, MD;  Location: [facility];  Service: Cardiovascular;  Laterality: N/A;  . Fingers removed from right hand.  2/84   traumatic work injury  . INSERT / REPLACE / REMOVE PACEMAKER  05/2008    . LUMBAR SPINE SURGERY  02/2009   Dr. Trenton Gammon  . PACEMAKER INSERTION  10/09   by Dr [name]    Current Outpatient Prescriptions  Medication Sig Dispense Refill  . apixaban (ELIQUIS) 5 MG TABS tablet Take 1 tablet (5 mg total) by mouth 2 (two) times daily. 60 tablet 11  . carvedilol (COREG) 25 MG tablet Take 1 tablet (25 mg total) by mouth 2 (two) times daily with a meal. 180 tablet 11  . clobetasol cream (TEMOVATE) 2.35 % Apply 1 application topically 2 (two) times daily.    [name] desonide (DESOWEN) 0.05 % cream Apply 1 application topically 2 (two) times daily.    [name] dofetilide (TIKOSYN) 500 MCG capsule TAKE 1 CAPSULE (500 MCG TOTAL) BY MOUTH 2  (TWO) TIMES DAILY. 180 capsule 3  . gabapentin (NEURONTIN) 100 MG capsule Take 100 mg by mouth 2 (two) times daily.     . methimazole (TAPAZOLE) 10 MG tablet Take 1 tablet (10 mg total) by mouth daily. 30 tablet 1  . omeprazole (PRILOSEC) 40 MG capsule Take 40 mg by mouth 2 (two) times daily.     [name] oxyCODONE-acetaminophen (PERCOCET) 10-325 MG per tablet Take 1 tablet by mouth every 4 (four) hours as needed for pain. 29 tablet 0  . simvastatin (ZOCOR) 40 MG tablet TAKE 1 TABLET (40 MG TOTAL) BY MOUTH DAILY. 30 tablet 11  . triamcinolone ointment (KENALOG) 0.1 % Apply 1 application topically 2 (two) times daily as needed for itching.  3   No current facility-administered medications for this encounter.     Allergies  Allergen Reactions  . Potassium Chloride     Dizziness and nausea     Social History   Social History  . Marital status: Legally Separated    Spouse name: N/A  . Number of children: 7  . Years of education: N/A   Occupational History  . unemployed Unemployed   Social History Main Topics  . Smoking status: Former Smoker    Packs/day: 1.00    Years: 15.00    Types: Cigarettes    Quit date: 04/04/2003  . Smokeless tobacco: Never Used  . Alcohol use No     Comment: formerly heavy ETOH,  now drinks a fifth of liquor every 2 weeks  . Drug use: No     Comment: previously used crack cocaine, quit 1999,  + marijuana  . Sexual activity: Yes    Birth control/ protection: Condom   Other Topics Concern  . Not on file   Social History Narrative   Unemployed currently.  Previously worked as a Dealer.  Lives in [place].    Family History  Problem Relation Age of Onset  . Heart disease Mother   . Breast cancer Mother   . Lung cancer Father   . Diabetes Unknown   . Colon cancer Neg Hx     ROS- All systems are reviewed and negative except as per the HPI above  Physical Exam: Vitals:   04/05/17 1010  BP: (!) 126/92  Pulse: 71  Weight: 264 lb 9.6 oz (120 kg)    Height: 5\' 11"  (1.803 m)    GEN- The patient is well appearing, alert and oriented x 3 today.   Head- normocephalic, atraumatic Eyes-  Sclera clear, conjunctiva pink Ears- hearing intact Oropharynx- clear Neck- supple, no JVP Lymph- no cervical lymphadenopathy Lungs- Clear to ausculation bilaterally, normal work of breathing Heart- Regular rate and rhythm, no murmurs, rubs or gallops,  PMI not laterally displaced GI- soft, NT, ND, + BS Extremities- no clubbing, cyanosis, or edema MS- no significant deformity or atrophy Skin- no rash or lesion Psych- euthymic mood, full affect Neuro- strength and sensation are intact  EKG- Sinus rhythm at 71 bpm, LAFB, SNst/t wave changes, prolonged qt at 462 ms Epic records reviewed Paceart reviewed and showed afib burden at 13.8%   Assessment and Plan: 1. Left arm pain Present upon awakening this am States it is the same type of pain that he remembers with NSTEMI in 2009 Ekg with nonspecific changes  2. Paroxysmal afib Pt is ready to commit to ablation, availability is open this Thursday and pt is interested in having then, if he checks out ok in the ER Continue apixaban, states no missed doses Continue carvedilol Continue dofetilide  3. OSA Compliance with cpap encouraged  3. Nonobstructive CAD Medical management has been advised in the past Previous LHC 9/17, with nonobstructive disease  4. Sick sinus Normal pacemaker function when last evaluated with Dr. Rayann Heman   Discussed with Dr. Rayann Heman and pt will go to the ER for his c/o left arm pain   Butch Penny C. Bailee Thall, [facility] [address] [place], [place] [address] [phone]

## 2017-05-05 NOTE — Progress Notes (Signed)
  Echocardiogram Echocardiogram Transesophageal has been performed.  Bobbye Charleston 05/05/2017, 11:26 AM

## 2017-05-05 NOTE — CV Procedure (Signed)
Procedure: TEE  Indication: Atrial fibrillation, pre-ablation  Sedation: Versed 3 mg IV, Fentanyl 50 mcg IV  Findings: Please see echo section for full report.  Mildly dilated LV with EF 35-40%, diffuse hypokinesis.  Normal wall thickness.  Mildly dilated RV with mildly decreased systolic function. Moderate left atrial enlargement, no LA appendage thrombus.  Normal right atrium.  No ASD or PFO by color doppler.  No significant TR.  Trivial MR.  Trileaflet aortic valve with no stenosis, trivial regurgitation.  Normal caliber thoracic aorta with mild plaque.    No LA thrombus, ok for ablation.   [name] 05/05/2017 11:26 AM

## 2017-05-05 NOTE — Telephone Encounter (Signed)
He's failed both omeprazole BID and protonix BID, which I why I recommended Dexilant. Given he's failed these two, he should be approved for Dexilant if you can touch base with them. Thanks

## 2017-05-05 NOTE — Telephone Encounter (Addendum)
Spoke to [place] at [employer] (phone [phone]) conversation number [phone]. Patient's insurance has denied Dexilant stating that he must have tried and failed or have contraindication to at least 2 preferred alternatives including: esomeprazole capsules, Nexium packets, pantoprazole tablets, protonix suspension, or omeprazole rx capsules. Patient has tried and failed omeprazole in the past (breakthrough reflux) and is on anticoags which we explained to insurance could be a contraindication for some PPI's. Please advise.Marland KitchenMarland KitchenMarland Kitchen

## 2017-05-05 NOTE — Interval H&P Note (Signed)
History and Physical Interval Note:  05/05/2017 11:10 AM  [name]  has presented today for surgery, with the diagnosis of AFIB  The various methods of treatment have been discussed with the patient and family. After consideration of risks, benefits and other options for treatment, the patient has consented to  Procedure(s): TRANSESOPHAGEAL ECHOCARDIOGRAM (TEE) (N/A) as a surgical intervention .  The patient's history has been reviewed, patient examined, no change in status, stable for surgery.  I have reviewed the patient's chart and labs.  Questions were answered to the patient's satisfaction.     Adianna Darwin [employer]

## 2017-05-06 ENCOUNTER — Encounter (HOSPITAL_COMMUNITY)
Admission: RE | Disposition: A | Discharge status: Home / Self Care | Payer: Self-pay | Source: Ambulatory Visit | Attending: Internal Medicine

## 2017-05-06 ENCOUNTER — Ambulatory Visit (HOSPITAL_COMMUNITY): Payer: Medicaid Other | Admitting: Anesthesiology

## 2017-05-06 ENCOUNTER — Encounter (HOSPITAL_COMMUNITY): Payer: Self-pay | Admitting: *Deleted

## 2017-05-06 ENCOUNTER — Ambulatory Visit (HOSPITAL_COMMUNITY)
Admission: RE | Admit: 2017-05-06 | Discharge: 2017-05-07 | Disposition: A | Discharge status: Home / Self Care | Payer: Medicaid Other | Source: Ambulatory Visit | Attending: Internal Medicine | Admitting: Internal Medicine

## 2017-05-06 DIAGNOSIS — Z8719 Personal history of other diseases of the digestive system: Secondary | ICD-10-CM | POA: Diagnosis not present

## 2017-05-06 DIAGNOSIS — Z950 Presence of cardiac pacemaker: Secondary | ICD-10-CM | POA: Insufficient documentation

## 2017-05-06 DIAGNOSIS — F329 Major depressive disorder, single episode, unspecified: Secondary | ICD-10-CM | POA: Insufficient documentation

## 2017-05-06 DIAGNOSIS — E785 Hyperlipidemia, unspecified: Secondary | ICD-10-CM | POA: Diagnosis not present

## 2017-05-06 DIAGNOSIS — I110 Hypertensive heart disease with heart failure: Secondary | ICD-10-CM | POA: Insufficient documentation

## 2017-05-06 DIAGNOSIS — I428 Other cardiomyopathies: Secondary | ICD-10-CM | POA: Insufficient documentation

## 2017-05-06 DIAGNOSIS — Z87891 Personal history of nicotine dependence: Secondary | ICD-10-CM | POA: Insufficient documentation

## 2017-05-06 DIAGNOSIS — G4733 Obstructive sleep apnea (adult) (pediatric): Secondary | ICD-10-CM | POA: Diagnosis not present

## 2017-05-06 DIAGNOSIS — I471 Supraventricular tachycardia: Secondary | ICD-10-CM | POA: Insufficient documentation

## 2017-05-06 DIAGNOSIS — I2510 Atherosclerotic heart disease of native coronary artery without angina pectoris: Secondary | ICD-10-CM | POA: Diagnosis not present

## 2017-05-06 DIAGNOSIS — E0500 Thyrotoxicosis with diffuse goiter without thyrotoxic crisis or storm: Secondary | ICD-10-CM | POA: Diagnosis not present

## 2017-05-06 DIAGNOSIS — Z6838 Body mass index (BMI) 38.0-38.9, adult: Secondary | ICD-10-CM | POA: Insufficient documentation

## 2017-05-06 DIAGNOSIS — I4892 Unspecified atrial flutter: Secondary | ICD-10-CM

## 2017-05-06 DIAGNOSIS — I252 Old myocardial infarction: Secondary | ICD-10-CM | POA: Insufficient documentation

## 2017-05-06 DIAGNOSIS — Z8711 Personal history of peptic ulcer disease: Secondary | ICD-10-CM | POA: Insufficient documentation

## 2017-05-06 DIAGNOSIS — Z9119 Patient's noncompliance with other medical treatment and regimen: Secondary | ICD-10-CM | POA: Insufficient documentation

## 2017-05-06 DIAGNOSIS — I5042 Chronic combined systolic (congestive) and diastolic (congestive) heart failure: Secondary | ICD-10-CM | POA: Insufficient documentation

## 2017-05-06 DIAGNOSIS — F4310 Post-traumatic stress disorder, unspecified: Secondary | ICD-10-CM | POA: Insufficient documentation

## 2017-05-06 DIAGNOSIS — I480 Paroxysmal atrial fibrillation: Secondary | ICD-10-CM

## 2017-05-06 DIAGNOSIS — Z86711 Personal history of pulmonary embolism: Secondary | ICD-10-CM | POA: Insufficient documentation

## 2017-05-06 DIAGNOSIS — Z7901 Long term (current) use of anticoagulants: Secondary | ICD-10-CM | POA: Insufficient documentation

## 2017-05-06 HISTORY — PX: ATRIAL FIBRILLATION ABLATION: EP1191

## 2017-05-06 LAB — SURGICAL PCR SCREEN
MRSA, PCR: NEGATIVE
Staphylococcus aureus: NEGATIVE

## 2017-05-06 LAB — POCT ACTIVATED CLOTTING TIME
ACTIVATED CLOTTING TIME: 224 s
ACTIVATED CLOTTING TIME: 257 s

## 2017-05-06 SURGERY — ATRIAL FIBRILLATION ABLATION
Anesthesia: General

## 2017-05-06 MED ORDER — PROMETHAZINE HCL 25 MG/ML IJ SOLN: 6.2500 mg | INTRAMUSCULAR | Status: DC | PRN

## 2017-05-06 MED ORDER — LIDOCAINE HCL (PF) 1 % IJ SOLN
INTRAMUSCULAR | Status: DC | PRN
  Administered 2017-05-06: 10 mL

## 2017-05-06 MED ORDER — HYDROCODONE-ACETAMINOPHEN 5-325 MG PO TABS
1.0000 | ORAL_TABLET | ORAL | Status: DC | PRN
  Filled 2017-05-06: qty 2

## 2017-05-06 MED ORDER — DEXAMETHASONE SODIUM PHOSPHATE 10 MG/ML IJ SOLN
INTRAMUSCULAR | Status: DC | PRN
  Administered 2017-05-06: 10 mg via INTRAVENOUS

## 2017-05-06 MED ORDER — GABAPENTIN 100 MG PO CAPS
100.0000 mg | ORAL_CAPSULE | Freq: Two times a day (BID) | ORAL | Status: DC
  Administered 2017-05-06 – 2017-05-07 (×2): 100 mg via ORAL
  Filled 2017-05-06 (×2): qty 1

## 2017-05-06 MED ORDER — CARVEDILOL 25 MG PO TABS: 25.0000 mg | ORAL_TABLET | Freq: Two times a day (BID) | ORAL | Status: DC

## 2017-05-06 MED ORDER — HEPARIN (PORCINE) IN NACL 2-0.9 UNIT/ML-% IJ SOLN
INTRAMUSCULAR | Status: AC | PRN
  Administered 2017-05-06: 500 mL

## 2017-05-06 MED ORDER — PHENYLEPHRINE HCL 10 MG/ML IJ SOLN
INTRAVENOUS | Status: DC | PRN
  Administered 2017-05-06: 30 ug/min via INTRAVENOUS

## 2017-05-06 MED ORDER — ADENOSINE 6 MG/2ML IV SOLN
INTRAVENOUS | Status: AC
  Filled 2017-05-06: qty 6

## 2017-05-06 MED ORDER — ONDANSETRON HCL 4 MG/2ML IJ SOLN
INTRAMUSCULAR | Status: DC | PRN
  Administered 2017-05-06: 4 mg via INTRAVENOUS

## 2017-05-06 MED ORDER — OXYCODONE HCL 5 MG PO TABS
5.0000 mg | ORAL_TABLET | ORAL | Status: DC | PRN
  Administered 2017-05-06 – 2017-05-07 (×2): 5 mg via ORAL
  Filled 2017-05-06 (×2): qty 1

## 2017-05-06 MED ORDER — SUGAMMADEX SODIUM 200 MG/2ML IV SOLN
INTRAVENOUS | Status: DC | PRN
  Administered 2017-05-06: 200 mg via INTRAVENOUS

## 2017-05-06 MED ORDER — SODIUM CHLORIDE 0.9% FLUSH: 3.0000 mL | INTRAVENOUS | Status: DC | PRN

## 2017-05-06 MED ORDER — ADENOSINE 6 MG/2ML IV SOLN
INTRAVENOUS | Status: AC
  Filled 2017-05-06: qty 2

## 2017-05-06 MED ORDER — PROPOFOL 10 MG/ML IV BOLUS
INTRAVENOUS | Status: DC | PRN
  Administered 2017-05-06: 100 mg via INTRAVENOUS
  Administered 2017-05-06: 50 mg via INTRAVENOUS

## 2017-05-06 MED ORDER — OXYCODONE-ACETAMINOPHEN 5-325 MG PO TABS
1.0000 | ORAL_TABLET | ORAL | Status: DC | PRN
  Administered 2017-05-06 – 2017-05-07 (×2): 1 via ORAL
  Filled 2017-05-06 (×2): qty 1

## 2017-05-06 MED ORDER — APIXABAN 5 MG PO TABS
5.0000 mg | ORAL_TABLET | Freq: Two times a day (BID) | ORAL | Status: DC
  Administered 2017-05-06 – 2017-05-07 (×2): 5 mg via ORAL
  Filled 2017-05-06 (×2): qty 1

## 2017-05-06 MED ORDER — HEPARIN SODIUM (PORCINE) 1000 UNIT/ML IJ SOLN
INTRAMUSCULAR | Status: DC | PRN
  Administered 2017-05-06: 5000 [IU] via INTRAVENOUS
  Administered 2017-05-06: 12000 [IU] via INTRAVENOUS
  Administered 2017-05-06: 5000 [IU] via INTRAVENOUS

## 2017-05-06 MED ORDER — LACTATED RINGERS IV SOLN
INTRAVENOUS | Status: DC | PRN
  Administered 2017-05-06 (×2): via INTRAVENOUS

## 2017-05-06 MED ORDER — ISOPROTERENOL HCL 0.2 MG/ML IJ SOLN
INTRAMUSCULAR | Status: AC
  Filled 2017-05-06: qty 5

## 2017-05-06 MED ORDER — ACETAMINOPHEN 325 MG PO TABS: 650.0000 mg | ORAL_TABLET | ORAL | Status: DC | PRN

## 2017-05-06 MED ORDER — SODIUM CHLORIDE 0.9 % IV SOLN: 250.0000 mL | INTRAVENOUS | Status: DC | PRN

## 2017-05-06 MED ORDER — METHIMAZOLE 10 MG PO TABS
10.0000 mg | ORAL_TABLET | Freq: Every day | ORAL | Status: DC
  Administered 2017-05-07: 10 mg via ORAL
  Filled 2017-05-06: qty 1

## 2017-05-06 MED ORDER — MIDAZOLAM HCL 2 MG/2ML IJ SOLN
INTRAMUSCULAR | Status: DC | PRN
  Administered 2017-05-06: 1 mg via INTRAVENOUS

## 2017-05-06 MED ORDER — LIDOCAINE HCL (PF) 1 % IJ SOLN
INTRAMUSCULAR | Status: AC
  Filled 2017-05-06: qty 30

## 2017-05-06 MED ORDER — ROCURONIUM BROMIDE 10 MG/ML (PF) SYRINGE
PREFILLED_SYRINGE | INTRAVENOUS | Status: DC | PRN
  Administered 2017-05-06: 50 mg via INTRAVENOUS
  Administered 2017-05-06: 30 mg via INTRAVENOUS

## 2017-05-06 MED ORDER — IOPAMIDOL (ISOVUE-370) INJECTION 76%
INTRAVENOUS | Status: AC
  Filled 2017-05-06: qty 50

## 2017-05-06 MED ORDER — FENTANYL CITRATE (PF) 100 MCG/2ML IJ SOLN
12.5000 ug | Freq: Once | INTRAMUSCULAR | Status: AC
  Administered 2017-05-06: 12.5 ug via INTRAVENOUS

## 2017-05-06 MED ORDER — DOFETILIDE 500 MCG PO CAPS
500.0000 ug | ORAL_CAPSULE | Freq: Two times a day (BID) | ORAL | Status: DC
  Administered 2017-05-06 – 2017-05-07 (×2): 500 ug via ORAL
  Filled 2017-05-06 (×2): qty 1

## 2017-05-06 MED ORDER — HEPARIN SODIUM (PORCINE) 1000 UNIT/ML IJ SOLN
INTRAMUSCULAR | Status: AC
  Filled 2017-05-06: qty 1

## 2017-05-06 MED ORDER — MIDAZOLAM HCL 2 MG/2ML IJ SOLN: 0.5000 mg | Freq: Once | INTRAMUSCULAR | Status: DC | PRN

## 2017-05-06 MED ORDER — FENTANYL CITRATE (PF) 100 MCG/2ML IJ SOLN
INTRAMUSCULAR | Status: AC
  Filled 2017-05-06: qty 2

## 2017-05-06 MED ORDER — PROTAMINE SULFATE 10 MG/ML IV SOLN
INTRAVENOUS | Status: DC | PRN
  Administered 2017-05-06: 30 mg via INTRAVENOUS

## 2017-05-06 MED ORDER — FENTANYL CITRATE (PF) 100 MCG/2ML IJ SOLN
12.5000 ug | Freq: Once | INTRAMUSCULAR | Status: AC
Start: 2017-05-06 — End: 2017-05-06
  Administered 2017-05-06: 12.5 ug via INTRAVENOUS

## 2017-05-06 MED ORDER — SODIUM CHLORIDE 0.9% FLUSH: 3.0000 mL | Freq: Two times a day (BID) | INTRAVENOUS | Status: DC

## 2017-05-06 MED ORDER — ONDANSETRON HCL 4 MG/2ML IJ SOLN: 4.0000 mg | Freq: Four times a day (QID) | INTRAMUSCULAR | Status: DC | PRN

## 2017-05-06 MED ORDER — FENTANYL CITRATE (PF) 250 MCG/5ML IJ SOLN
INTRAMUSCULAR | Status: DC | PRN
  Administered 2017-05-06 (×2): 50 ug via INTRAVENOUS

## 2017-05-06 MED ORDER — OXYCODONE-ACETAMINOPHEN 5-325 MG PO TABS
ORAL_TABLET | ORAL | Status: AC
  Filled 2017-05-06: qty 1

## 2017-05-06 MED ORDER — IOPAMIDOL (ISOVUE-370) INJECTION 76%
INTRAVENOUS | Status: DC | PRN
  Administered 2017-05-06: 5 mL via INTRAVENOUS

## 2017-05-06 MED ORDER — ADENOSINE 6 MG/2ML IV SOLN
INTRAVENOUS | Status: AC
Start: 2017-05-06 — End: 2017-05-06
  Filled 2017-05-06: qty 2

## 2017-05-06 MED ORDER — MEPERIDINE HCL 25 MG/ML IJ SOLN: 6.2500 mg | INTRAMUSCULAR | Status: DC | PRN

## 2017-05-06 MED ORDER — OXYCODONE-ACETAMINOPHEN 10-325 MG PO TABS
1.0000 | ORAL_TABLET | ORAL | Status: DC | PRN
  Administered 2017-05-06: 1 via ORAL

## 2017-05-06 MED ORDER — HEPARIN SODIUM (PORCINE) 1000 UNIT/ML IJ SOLN
INTRAMUSCULAR | Status: DC | PRN
  Administered 2017-05-06: 1000 [IU] via INTRAVENOUS

## 2017-05-06 MED ORDER — DEXTROSE 5 % IV SOLN
INTRAVENOUS | Status: DC | PRN
  Administered 2017-05-06: 10 ug/min via INTRAVENOUS

## 2017-05-06 SURGICAL SUPPLY — 21 items
BAG SNAP BAND KOVER 36X36 (MISCELLANEOUS) ×2 IMPLANT
BLANKET WARM UNDERBOD FULL ACC (MISCELLANEOUS) ×2 IMPLANT
CATH MAPPNG PENTARAY F 2-6-2MM (CATHETERS) IMPLANT
CATH SMTCH THERMOCOOL SF DF (CATHETERS) ×1 IMPLANT
CATH SOUNDSTAR ECO REPROCESSED (CATHETERS) ×1 IMPLANT
CATH WEBSTER BI DIR CS D-F CRV (CATHETERS) ×1 IMPLANT
COVER SWIFTLINK CONNECTOR (BAG) ×2 IMPLANT
HOVERMATT SINGLE USE (MISCELLANEOUS) ×1 IMPLANT
NDL TRANSEP BRK 71CM 407200 (NEEDLE) IMPLANT
NEEDLE TRANSEP BRK 71CM 407200 (NEEDLE) ×2 IMPLANT
PACK EP LATEX FREE (CUSTOM PROCEDURE TRAY) ×2
PACK EP LF (CUSTOM PROCEDURE TRAY) ×1 IMPLANT
PAD DEFIB LIFELINK (PAD) ×2 IMPLANT
PATCH CARTO3 (PAD) ×1 IMPLANT
PENTARAY F 2-6-2MM (CATHETERS) ×2
SHEATH AVANTI 11F 11CM (SHEATH) ×1 IMPLANT
SHEATH BAYLIS TRANSSEPTAL 98CM (NEEDLE) ×1 IMPLANT
SHEATH PINNACLE 7F 10CM (SHEATH) ×2 IMPLANT
SHEATH PINNACLE 9F 10CM (SHEATH) ×1 IMPLANT
SHEATH SWARTZ TS SL2 63CM 8.5F (SHEATH) ×1 IMPLANT
TUBING SMART ABLATE COOLFLOW (TUBING) ×1 IMPLANT

## 2017-05-06 NOTE — Progress Notes (Signed)
[email or web address] of lower back pain " due to a back fusion". Pt encouraged to participate in guided imagery to reduce anxiety and pain. Pain meds given approx 15 min ago. Pt. Encouraged to relax and let them work.

## 2017-05-06 NOTE — Progress Notes (Signed)
7 fr , 9 fr, and 11 fr venous sheath removed from RFV. Pt. Medicated just prior for pain rated "8/10" (chronic back pain". Manual pressure held to RFV site for 30 min with hemostasis achieved. A dry sterile dressing applied to rfv. Pt. Advised if he laughs, coughs, sneezes,bears down, or clears throat to hold pressure to rfv dressing. Pt. Verbalized understanding and pt hand guided to rfv site as he return demonstrated how to hold pressure.Pt. Also advised if he felt something wet/warm to let me know right away- pt verbalized such. Pt v.s.s remained RSR 54 during hold with bps 150/90. As I began to place call bell pt advised that he felt something warm. I looked and noted heme. Dressing removed and manual pressure resumed with small hematoma noted medially. Mickel Baas RN, took over hold and made aware of hematoma toward the medical aspect. Pt c/o pain "7/10" -pt. Medicated as ordered. Mickel Baas, RN in control. Monitoring

## 2017-05-06 NOTE — Anesthesia Postprocedure Evaluation (Signed)
Anesthesia Post Note  Patient: CANDON CARAS  Procedure(s) Performed: Procedure(s) (LRB): Atrial Fibrillation Ablation (N/A)     Patient location during evaluation: Cath Lab Anesthesia Type: General Level of consciousness: awake and alert, patient cooperative and oriented Pain management: pain level controlled Vital Signs Assessment: post-procedure vital signs reviewed and stable Respiratory status: spontaneous breathing, nonlabored ventilation and respiratory function stable Cardiovascular status: blood pressure returned to baseline and stable Postop Assessment: no signs of nausea or vomiting Anesthetic complications: no    Last Vitals:  Vitals:   05/06/17 0548 05/06/17 1110  BP: (!) 166/101 (!) 156/98  Pulse: (!) 50 (!) 59  Resp:  15  Temp: 36.4 C   SpO2: 99% 99%    Last Pain:  Vitals:   05/06/17 1125  TempSrc:   PainSc: 10-Worst pain ever                 Evin Chirco,E. Ronny Korff

## 2017-05-06 NOTE — Anesthesia Procedure Notes (Signed)
Procedure Name: Intubation Date/Time: 05/06/2017 7:56 AM Performed by: Mervyn Gay Pre-anesthesia Checklist: Patient identified, Patient being monitored, Timeout performed, Emergency Drugs available and Suction available Patient Re-evaluated:Patient Re-evaluated prior to induction Oxygen Delivery Method: Circle System Utilized Preoxygenation: Pre-oxygenation with 100% oxygen Induction Type: IV induction Ventilation: Oral airway inserted - appropriate to patient size and Mask ventilation without difficulty Laryngoscope Size: Miller and 3 Grade View: Grade I Tube type: Oral Tube size: 7.5 mm Number of attempts: 1 Airway Equipment and Method: Stylet Placement Confirmation: ETT inserted through vocal cords under direct vision,  positive ETCO2 and breath sounds checked- equal and bilateral Secured at: 23 cm Tube secured with: Tape Dental Injury: Teeth and Oropharynx as per pre-operative assessment

## 2017-05-06 NOTE — H&P (Signed)
PCP:  Ricke Hey, MD     Primary Electrophysiologist: Dr [name]            Chief Complaint  Patient presents with  Afib      History of Present Illness: [name] is a 56 y.o. male who presents today for afib ablation   The patient is s/p afib ablation by me in 2012.  He has done well but continues to have atrial arrhythmias.  He has atrial flutter/ atach with very fast ventricular rates and symptoms of presyncope.   I saw him several months ago and offered ablation.  He agreed but cancelled the procedure last minute.  He has been seen back in the AF clinic since then but has been noncompliant with follow-up here.  He is living in [place] which makes follow-up difficulty for him.  He says that he is planning to move back to [place] soon. He seems to be doing "much better".  Arrhythmias are shorter and infrequent though overall burden is about the same.  He has occasional chest discomfort which he attributes to indigestion. He recently underwent LHC.  He did not have any obstructive lesions.  Medical therapy is advised.  Today, he denies symptoms of orthopnea, PND, lower extremity edema, claudication, bleeding, or neurologic sequela. The patient is tolerating medications without difficulties and is otherwise without complaint today.        Past Medical History:  Diagnosis Date  . Atrial fibrillation ([place])    s/p afib ablation  PVI 5/12 JA  . CAD -nonobstructive    S/P nstemi-type II cath 05/2008 - nonobs. dzs.  . DDD (degenerative disc disease)    evaluate by neurosurgery is ongoing  . Depression    ptsd  . Erectile dysfunction   . Headache(784.0)   . Hemorrhoids   . HTN (hypertension)   . Hyperlipemia   . Hyperthyroidism    Graves dz on methimazole and followed by [name]  . Obesity   . Pancreatitis   . Polysubstance abuse    cocaine, last used 1999  . PUD (peptic ulcer disease)    gastritis due to ETOH previously  .  Pulmonary embolism ([place]) 04/2003   Hx of  . Rectal bleeding   . Sinus node dysfunction ([facility])    s/p MDT PPM - AAI - V port plugged  . Sleep apnea    uses CPAP [facility] SLEEP CTR 2 YRS  . Systolic and diastolic CHF, chronic ([place])    EF 35% by most recent echo 09/19/10  . 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 ANTERIOR CERVICAL DECOMP/DISCECTOMY FUSION  12/15/2011   Procedure: ANTERIOR CERVICAL DECOMPRESSION/DISCECTOMY FUSION 1 LEVEL;  Surgeon: [name], MD;  Location: [facility];  Service: Neurosurgery;  Laterality: N/A;  Cervical Six-Seven Anterior Cervical Diskectomy fusion with Allograft and Plating  . ATRIAL ABLATION SURGERY  12/2010   afib ablation by [name]  . BACK SURGERY    . CARDIAC CATHETERIZATION     CADIAC ABLATION [name] 12/2010  . CARDIAC CATHETERIZATION N/A 05/08/2016   Procedure: Left Heart Cath and Coronary Angiography;  Surgeon: Peter M Martinique, MD;  Location: [facility];  Service: Cardiovascular;  Laterality: N/A;  . Fingers removed from right hand.  2/84   traumatic work injury  . INSERT / REPLACE / REMOVE PACEMAKER     05/2008    .  LUMBAR SPINE SURGERY  02/2009   Dr. Trenton Gammon  . PACEMAKER INSERTION  10/09   by Dr [name]     Current Outpatient Prescriptions  Medication Sig Dispense Refill  . carvedilol (COREG) 25 MG tablet Take 1 tablet (25 mg total) by mouth 2 (two) times daily with a meal. 180 tablet 11  . gabapentin (NEURONTIN) 100 MG capsule Take 100 mg by mouth 2 (two) times daily.     [name] KLOR-CON M20 20 MEQ tablet TAKE 1 TABLET (20 MEQ TOTAL) BY MOUTH DAILY. 30 tablet 0  . methimazole (TAPAZOLE) 10 MG tablet Take 1 tablet (10 mg total) by mouth daily. 30 tablet 1  . omeprazole (PRILOSEC) 40 MG capsule Take 40 mg by mouth 2 (two) times daily.     [name] oxyCODONE-acetaminophen (PERCOCET) 10-325 MG per tablet Take 1 tablet by mouth every 4 (four)  hours as needed for pain. 29 tablet 0  . PRADAXA 150 MG CAPS capsule TAKE ONE CAPSULE BY MOUTH EVERY 12 HOURS 120 capsule 3  . simvastatin (ZOCOR) 40 MG tablet TAKE 1 TABLET (40 MG TOTAL) BY MOUTH DAILY. 30 tablet 11  . TIKOSYN 500 MCG capsule TAKE 1 CAPSULE (500 MCG TOTAL) BY MOUTH 2 (TWO) TIMES DAILY. 180 capsule 2   No current facility-administered medications for this visit.     Allergies:   Celebrex [celecoxib] and Prednisolone   Social History:  The patient  reports that he quit smoking about 13 years ago. His smoking use included Cigarettes. He has a 15.00 pack-year smoking history. He has never used smokeless tobacco. He reports that he does not drink alcohol or use drugs.   Family History:  The patient's family history includes Breast cancer in his mother; Heart disease in his mother; Lung cancer in his father.    ROS:  Please see the history of present illness.   All other systems are reviewed and negative.    PHYSICAL EXAM: Vitals:   05/06/17 0548  BP: (!) 166/101  Pulse: (!) 50  Temp: 97.6 F (36.4 C)  SpO2: 99%   GEN: Well nourished, well developed, in no acute distress  HEENT: normal  Neck: no JVD, carotid bruits, or masses Cardiac: RRR; no murmurs, rubs, or gallops,no edema  Respiratory:  clear to auscultation bilaterally, normal work of breathing GI: soft, nontender, nondistended, + BS MS: no deformity or atrophy  Skin: warm and dry, device pocket is well healed Neuro:  Strength and sensation are intact Psych: euthymic mood, full affect  Device remote from 05/02/16 is reviewed and reveals afib burden of 14%   Recent Labs: 02/03/2016: ALT 20 03/30/2016: Magnesium 2.1; TSH 0.45 05/06/2016: BUN 15; Creat 0.96; Hemoglobin 14.2; Platelets 210; Potassium 4.4; Sodium 143    Lipid Panel  Labs(Brief)          Component Value Date/Time   CHOL 163 02/03/2016 1026   TRIG 68 02/03/2016 1026   HDL 47 02/03/2016 1026   CHOLHDL 3.5 02/03/2016 1026    VLDL 14 02/03/2016 1026   LDLCALC 102 02/03/2016 1026   LDLDIRECT 163.8 02/08/2008 1003          Wt Readings from Last 3 Encounters:  09/07/16 266 lb (120.7 kg)  06/24/16 253 lb 6.4 oz (114.9 kg)  05/18/16 243 lb 12.8 oz (110.6 kg)     ASSESSMENT AND PLAN:  1.  Atrial arrhythmias He has symptomatic afib/ atach Therapeutic strategies for afib/atach including medicine and ablation were discussed in detail with the patient today. Risk,  benefits, and alternatives to EP study and radiofrequency ablation were also discussed in detail today. These risks include but are not limited to stroke, bleeding, vascular damage, tamponade, perforation, damage to the esophagus, lungs, and other structures, pulmonary vein stenosis, worsening renal function, and death. The patient understands these risk and wishes to proceed.  He reports compliance with eliquis without interruption.  TEE is reviewed  2. Sick sinus Normal pacemaker function See Pace Art report No changes today  3. OSA Compliance with CPAP is encouarged  4. Nonischemic CM TEE reviewed   Thompson Grayer MD, [facility] 05/06/2017 7:12 AM

## 2017-05-06 NOTE — Telephone Encounter (Signed)
I have contacted patient's insurance company again with additional clinical information. Patient's Dexilant has now been approved. Approval [id number]. Approval good from 05/06/17- 05/01/18. I spoke to [place], interaction [id number].

## 2017-05-06 NOTE — Transfer of Care (Signed)
Immediate Anesthesia Transfer of Care Note  Patient: [name]  Procedure(s) Performed: Procedure(s): Atrial Fibrillation Ablation (N/A)  Patient Location: Cath Lab  Anesthesia Type:General  Level of Consciousness: drowsy and patient cooperative  Airway & Oxygen Therapy: Patient Spontanous Breathing and Patient connected to face mask oxygen  Post-op Assessment: Report given to RN, Post -op Vital signs reviewed and stable and Patient moving all extremities X 4  Post vital signs: Reviewed and stable  Last Vitals:  Vitals:   05/06/17 0548  BP: (!) 166/101  Pulse: (!) 50  Temp: 36.4 C  SpO2: 99%    Last Pain:  Vitals:   05/06/17 0548  TempSrc: Oral         Complications: No apparent anesthesia complications

## 2017-05-07 ENCOUNTER — Telehealth: Payer: Self-pay | Admitting: Gastroenterology

## 2017-05-07 ENCOUNTER — Encounter (HOSPITAL_COMMUNITY): Payer: Self-pay | Admitting: Internal Medicine

## 2017-05-07 DIAGNOSIS — I471 Supraventricular tachycardia: Secondary | ICD-10-CM | POA: Diagnosis not present

## 2017-05-07 DIAGNOSIS — I428 Other cardiomyopathies: Secondary | ICD-10-CM | POA: Diagnosis not present

## 2017-05-07 DIAGNOSIS — I480 Paroxysmal atrial fibrillation: Secondary | ICD-10-CM

## 2017-05-07 DIAGNOSIS — I4892 Unspecified atrial flutter: Secondary | ICD-10-CM | POA: Diagnosis not present

## 2017-05-07 LAB — BASIC METABOLIC PANEL
ANION GAP: 8 (ref 5–15)
BUN: 10 mg/dL (ref 6–20)
CHLORIDE: 106 mmol/L (ref 101–111)
CO2: 25 mmol/L (ref 22–32)
Calcium: 9 mg/dL (ref 8.9–10.3)
Creatinine, Ser: 0.92 mg/dL (ref 0.61–1.24)
Glucose, Bld: 136 mg/dL — ABNORMAL HIGH (ref 65–99)
POTASSIUM: 4 mmol/L (ref 3.5–5.1)
SODIUM: 139 mmol/L (ref 135–145)

## 2017-05-07 LAB — MAGNESIUM: Magnesium: 2.1 mg/dL (ref 1.7–2.4)

## 2017-05-07 LAB — POCT ACTIVATED CLOTTING TIME
ACTIVATED CLOTTING TIME: 180 s
Activated Clotting Time: 180 seconds

## 2017-05-07 NOTE — Plan of Care (Signed)
Problem: Physical Regulation: Goal: Will remain free from infection Outcome: Completed/Met Date Met: 05/07/17 Pt has remained infection free. Pt educated on the importance of hand hygiene in regards to preventing infection.

## 2017-05-07 NOTE — Plan of Care (Signed)
Problem: Physical Regulation: Goal: Ability to maintain clinical measurements within normal limits will improve Outcome: Progressing Pt denies any CP. Remains in NSR/Paced Rhythm  on cardiac monitor. VSS. Continue to monitor.

## 2017-05-07 NOTE — Discharge Summary (Signed)
ELECTROPHYSIOLOGY PROCEDURE DISCHARGE SUMMARY    Patient ID: [name],  MRN: [id number], DOB/AGE: 1961-07-13 56 y.o.  Admit date: 05/06/2017 Discharge date: 05/07/2017  Primary Care Physician: Dr Alyson Ingles  Electrophysiologist:  Dr [name]  Primary Discharge Diagnosis:  1. Atrial fibrillation  Secondary Discharge Diagnosis:  1. Ectopic atrial tachycardia 2. Nonischemic cardiomyopathy 3. Chronic systolic dysfunction  Procedures This Admission:  1.  Electrophysiology study and radiofrequency catheter ablation on 05/06/17 by Dr Thompson Grayer.  This study demonstrated return of electrical conduction within the right superior pulmonary vein which was the culprit for his recurrent arrhythmias.  This PV was successfully re-isolated.    Brief HPI: [name] is a 56 y.o. male with a history of paroyxsmal atrial fibrillation.  They have failed medical therapy with tikosyn. Risks, benefits, and alternatives to catheter ablation of atrial fibrillation were reviewed with the patient who wished to proceed.  The patient underwent TEE prior to the procedure which demonstrated normal LV function and no LAA thrombus.    Hospital Course:  The patient was admitted and underwent EPS/RFCA of atrial fibrillation with details as outlined above.  They were monitored on telemetry overnight which demonstrated sinus rhythm.  Groin was without complication on the day of discharge.  The patient was examined and considered to be stable for discharge.  Wound care and restrictions were reviewed with the patient.  The patient will be seen back by Roderic Palau, NP in 4 weeks and [name] in 12 weeks for post ablation follow up.   This patients CHA2DS2-VASc Score and unadjusted Ischemic Stroke Rate (% per year) is equal to 3.2 % stroke rate/year from a score of 3 Above score calculated as 1 point each if present [CHF, HTN, DM, Vascular=MI/PAD/Aortic Plaque, Age if 65-74, or Male] Above score  calculated as 2 points each if present [Age > 75, or Stroke/TIA/TE]   Physical Exam: Vitals:   05/06/17 1900 05/06/17 1921 05/06/17 2042 05/07/17 0448  BP: (!) 159/98 (!) 131/93 136/87 128/74  Pulse: 61 (!) 53 (!) 55 (!) 59  Resp: (!) 32 20 20 19   Temp:   98 F (36.7 C) 98.3 F (36.8 C)  TempSrc:      SpO2: 97% 97% 98% 96%  Weight:    273 lb 6.4 oz (124 kg)  Height:        GEN- The patient is well appearing, alert and oriented x 3 today.   HEENT: normocephalic, atraumatic; sclera clear, conjunctiva pink; hearing intact; oropharynx clear; neck supple  Lungs- Clear to ausculation bilaterally, normal work of breathing.  No wheezes, rales, rhonchi Heart- Regular rate and rhythm, no murmurs, rubs or gallops  GI- soft, non-tender, non-distended, bowel sounds present  Extremities- no clubbing, cyanosis, or edema; DP/PT/radial pulses 2+ bilaterally, groin without hematoma/bruit MS- no significant deformity or atrophy Skin- warm and dry, no rash or lesion Psych- euthymic mood, full affect Neuro- strength and sensation are intact   Labs:   Lab Results  Component Value Date   WBC 4.5 04/28/2017   HGB 14.9 04/28/2017   HCT 44.2 04/28/2017   MCV 81 04/28/2017   PLT 223 04/28/2017    Recent Labs Lab 05/07/17 0345  NA 139  K 4.0  CL 106  CO2 25  BUN 10  CREATININE 0.92  CALCIUM 9.0  GLUCOSE 136*     Discharge Medications:  Allergies as of 05/07/2017      Reactions   Potassium Chloride Other (See Comments)  Dizziness and nausea       Medication List    TAKE these medications   apixaban 5 MG Tabs tablet Commonly known as:  ELIQUIS Take 1 tablet (5 mg total) by mouth 2 (two) times daily.   carvedilol 25 MG tablet Commonly known as:  COREG Take 1 tablet (25 mg total) by mouth 2 (two) times daily with a meal.   clobetasol cream 0.05 % Commonly known as:  TEMOVATE Apply 1 application topically 2 (two) times daily as needed (for rash).   desonide 0.05 %  cream Commonly known as:  DESOWEN Apply 1 application topically 2 (two) times daily as needed (for rash).   dexlansoprazole 60 MG capsule Commonly known as:  DEXILANT Take 1 capsule (60 mg total) by mouth daily.   dofetilide 500 MCG capsule Commonly known as:  TIKOSYN TAKE 1 CAPSULE (500 MCG TOTAL) BY MOUTH 2 (TWO) TIMES DAILY.   gabapentin 100 MG capsule Commonly known as:  NEURONTIN Take 100 mg by mouth 2 (two) times daily.   ibuprofen 200 MG tablet Commonly known as:  ADVIL,MOTRIN Take 800 mg by mouth every 6 (six) hours as needed for headache (back pain).   methimazole 10 MG tablet Commonly known as:  TAPAZOLE Take 1 tablet (10 mg total) by mouth daily. What changed:  when to take this   oxyCODONE-acetaminophen 10-325 MG tablet Commonly known as:  PERCOCET Take 1 tablet by mouth every 4 (four) hours as needed for pain.   simvastatin 40 MG tablet Commonly known as:  ZOCOR TAKE 1 TABLET (40 MG TOTAL) BY MOUTH DAILY.   sucralfate 1 GM/10ML suspension Commonly known as:  CARAFATE Take 10 mLs (1 g total) by mouth every 6 (six) hours.   triamcinolone ointment 0.1 % Commonly known as:  KENALOG Apply 1 application topically 2 (two) times daily as needed (for rash).       Disposition:   Follow-up Information    Sherran Needs, NP Follow up on 06/04/2017.   Specialties:  Nurse Practitioner, Cardiology Why:  11 am Contact information: [place] [address] [phone]        Thompson Grayer, MD Follow up on 08/04/2017.   Specialty:  Cardiology Why:  9:30 am Contact information: [place] [address] [phone]           Duration of Discharge Encounter: Greater than 30 minutes including physician time.  Army Fossa MD 05/07/2017 7:36 AM

## 2017-05-07 NOTE — Discharge Instructions (Signed)
No driving for 4. No lifting over 5 lbs for 1 week. No sexual activity for 1 week.  Keep procedure site clean & dry. If you notice increased pain, swelling, bleeding or pus, call/return!  You may shower, but no soaking baths/hot tubs/pools for 1 week.  ° ° °

## 2017-05-07 NOTE — Plan of Care (Signed)
Problem: Activity: Goal: Risk for activity intolerance will decrease Outcome: Completed/Met Date Met: 05/07/17 Pt has been up ambulating & he tolerated it well. No complaints

## 2017-05-07 NOTE — Telephone Encounter (Signed)
Spoke to patient.  Let him know that we were able to get Dexilant approved. He expressed understanding and will ask pharmacy to resubmit Rx.

## 2017-05-14 ENCOUNTER — Telehealth: Payer: Self-pay | Admitting: Internal Medicine

## 2017-05-14 NOTE — Telephone Encounter (Signed)
New message   Pt c/o swelling: STAT is pt has developed SOB within 24 hours  1. How long have you been experiencing swelling? Pt states swelling just came up also having a knot on his ablation site  2. Where is the swelling located? At the ablation site  3.  Are you currently taking a "fluid pill"? no  4.  Are you currently SOB? no  5.  Have you traveled recently? NO

## 2017-05-14 NOTE — Telephone Encounter (Signed)
Patient calling and states that he has a small peanut sized knot at the ablation site that he noticed when he took the bandage off. Patient denies having any bleeding at the site. Patient denies having any numbness, tingling, color or temperature changes, or any other problems. Patient advised to continue to monitor and let us know if his symptoms worsen. Patient verbalized understanding.

## 2017-06-04 ENCOUNTER — Inpatient Hospital Stay (HOSPITAL_COMMUNITY): Admission: RE | Admit: 2017-06-04 | Payer: Medicaid Other | Source: Ambulatory Visit | Admitting: Nurse Practitioner

## 2017-06-08 ENCOUNTER — Encounter (HOSPITAL_COMMUNITY): Payer: Self-pay | Admitting: Nurse Practitioner

## 2017-06-08 ENCOUNTER — Ambulatory Visit (HOSPITAL_COMMUNITY)
Admission: RE | Admit: 2017-06-08 | Discharge: 2017-06-08 | Disposition: A | Discharge status: Home / Self Care | Payer: Medicaid Other | Source: Ambulatory Visit | Attending: Nurse Practitioner | Admitting: Nurse Practitioner

## 2017-06-08 VITALS — BP 122/94 | Ht 71.0 in | Wt 272.8 lb

## 2017-06-08 DIAGNOSIS — N529 Male erectile dysfunction, unspecified: Secondary | ICD-10-CM | POA: Diagnosis not present

## 2017-06-08 DIAGNOSIS — I2510 Atherosclerotic heart disease of native coronary artery without angina pectoris: Secondary | ICD-10-CM | POA: Diagnosis not present

## 2017-06-08 DIAGNOSIS — I110 Hypertensive heart disease with heart failure: Secondary | ICD-10-CM | POA: Insufficient documentation

## 2017-06-08 DIAGNOSIS — I495 Sick sinus syndrome: Secondary | ICD-10-CM | POA: Insufficient documentation

## 2017-06-08 DIAGNOSIS — Z981 Arthrodesis status: Secondary | ICD-10-CM | POA: Insufficient documentation

## 2017-06-08 DIAGNOSIS — F329 Major depressive disorder, single episode, unspecified: Secondary | ICD-10-CM | POA: Insufficient documentation

## 2017-06-08 DIAGNOSIS — Z801 Family history of malignant neoplasm of trachea, bronchus and lung: Secondary | ICD-10-CM | POA: Diagnosis not present

## 2017-06-08 DIAGNOSIS — I480 Paroxysmal atrial fibrillation: Secondary | ICD-10-CM

## 2017-06-08 DIAGNOSIS — Z6838 Body mass index (BMI) 38.0-38.9, adult: Secondary | ICD-10-CM | POA: Insufficient documentation

## 2017-06-08 DIAGNOSIS — Z79899 Other long term (current) drug therapy: Secondary | ICD-10-CM | POA: Diagnosis not present

## 2017-06-08 DIAGNOSIS — G4733 Obstructive sleep apnea (adult) (pediatric): Secondary | ICD-10-CM | POA: Diagnosis not present

## 2017-06-08 DIAGNOSIS — I4891 Unspecified atrial fibrillation: Secondary | ICD-10-CM | POA: Insufficient documentation

## 2017-06-08 DIAGNOSIS — I252 Old myocardial infarction: Secondary | ICD-10-CM | POA: Diagnosis not present

## 2017-06-08 DIAGNOSIS — F4310 Post-traumatic stress disorder, unspecified: Secondary | ICD-10-CM | POA: Insufficient documentation

## 2017-06-08 DIAGNOSIS — I5042 Chronic combined systolic (congestive) and diastolic (congestive) heart failure: Secondary | ICD-10-CM | POA: Insufficient documentation

## 2017-06-08 DIAGNOSIS — Z950 Presence of cardiac pacemaker: Secondary | ICD-10-CM | POA: Diagnosis not present

## 2017-06-08 DIAGNOSIS — Z803 Family history of malignant neoplasm of breast: Secondary | ICD-10-CM | POA: Insufficient documentation

## 2017-06-08 DIAGNOSIS — Z8711 Personal history of peptic ulcer disease: Secondary | ICD-10-CM | POA: Diagnosis not present

## 2017-06-08 DIAGNOSIS — Z87891 Personal history of nicotine dependence: Secondary | ICD-10-CM | POA: Diagnosis not present

## 2017-06-08 DIAGNOSIS — Z8249 Family history of ischemic heart disease and other diseases of the circulatory system: Secondary | ICD-10-CM | POA: Diagnosis not present

## 2017-06-08 DIAGNOSIS — E669 Obesity, unspecified: Secondary | ICD-10-CM | POA: Insufficient documentation

## 2017-06-08 DIAGNOSIS — E785 Hyperlipidemia, unspecified: Secondary | ICD-10-CM | POA: Insufficient documentation

## 2017-06-08 DIAGNOSIS — Z888 Allergy status to other drugs, medicaments and biological substances status: Secondary | ICD-10-CM | POA: Insufficient documentation

## 2017-06-08 DIAGNOSIS — Z86711 Personal history of pulmonary embolism: Secondary | ICD-10-CM | POA: Diagnosis not present

## 2017-06-08 DIAGNOSIS — Z7901 Long term (current) use of anticoagulants: Secondary | ICD-10-CM | POA: Diagnosis not present

## 2017-06-08 NOTE — Progress Notes (Signed)
Primary Care Physician: [facility], [place] Referring Physician: Dr. Letta Pate is a 56 y.o. male with h/o PPM, s/p afib ablation by Dr. Rayann Heman in 2012.  He continued to have atrial arrhythmias.  He had atrial flutter/ atach./afib with very fast ventricular rates and symptoms of presyncope.  He felt that episodes have worsened. He is on tikosyn without relief.He has had  LHC in 2017. He did not have any obstructive lesions, but did have a NSTEMI in 2009.  Medical therapy was advised. He was scheduled for ablation in past but  cancelled procedure.  He was admitted to hospital 8/4 thru 8/5 with acute on chronic diastolic HF and Afib with RVR. He followed up in afib clinic and  expressed desire to have second ablation. This was scheduled for pt and he went on to have this done 9/18 with Dr. Rayann Heman. He is in afib clinic for f/u ablation ,10/9. He has not noted any afib. No swallowing difficulties. Just mentions some soreness of rt groin.He continues on Tikosyn and eliquis without missed doses.  Today, he denies symptoms of palpitations, chest pain, shortness of breath, orthopnea, PND, lower extremity edema, dizziness, presyncope, syncope, or neurologic sequela. Positive for left arm pain. The patient is tolerating medications without difficulties and is otherwise without complaint today.   Past Medical History:  Diagnosis Date  . Atrial fibrillation ([place])    s/p afib ablation  PVI 5/12 JA  . CAD -nonobstructive    S/P nstemi-type II cath 05/2008 - nonobs. dzs.  . DDD (degenerative disc disease)    evaluate by neurosurgery is ongoing  . Depression    ptsd  . Erectile dysfunction   . Headache(784.0)   . Hemorrhoids   . HTN (hypertension)   . Hyperlipemia   . Hyperthyroidism    Graves dz on methimazole and followed by [name]  . Obesity   . Pancreatitis   . Polysubstance abuse ([place])    cocaine, last used 1999  . PUD (peptic ulcer disease)    gastritis due to  ETOH previously  . Pulmonary embolism ([place]) 04/2003   Hx of  . Rectal bleeding   . Sinus node dysfunction ([facility])    s/p MDT PPM - AAI - V port plugged  . Sleep apnea    uses CPAP [facility] 2 YRS  . Systolic and diastolic CHF, chronic ([place])    EF 35% by most recent echo 09/19/10  . 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 ANTERIOR CERVICAL DECOMP/DISCECTOMY FUSION  12/15/2011   Procedure: ANTERIOR CERVICAL DECOMPRESSION/DISCECTOMY FUSION 1 LEVEL;  Surgeon: [name], MD;  Location: [facility];  Service: Neurosurgery;  Laterality: N/A;  Cervical Six-Seven Anterior Cervical Diskectomy fusion with Allograft and Plating  . ATRIAL ABLATION SURGERY  12/2010   afib ablation by [name]  . ATRIAL FIBRILLATION ABLATION N/A 05/06/2017   Procedure: Atrial Fibrillation Ablation;  Surgeon: [place] Grayer, MD;  Location: [facility];  Service: Cardiovascular;  Laterality: N/A;  . BACK SURGERY    . CARDIAC CATHETERIZATION     CADIAC ABLATION [name] 12/2010  . CARDIAC CATHETERIZATION N/A 05/08/2016   Procedure: Left Heart Cath and Coronary Angiography;  Surgeon: Peter M Martinique, MD;  Location: [facility] LAB;  Service: Cardiovascular;  Laterality: N/A;  . Fingers removed from right hand.  2/84   traumatic  work injury  . INSERT / REPLACE / REMOVE PACEMAKER     05/2008    . LUMBAR SPINE SURGERY  02/2009   Dr. Trenton Gammon  . PACEMAKER INSERTION  10/09   by Dr [name]  . TEE WITHOUT CARDIOVERSION N/A 05/05/2017   Procedure: TRANSESOPHAGEAL ECHOCARDIOGRAM (TEE);  Surgeon: Larey Dresser, MD;  [facility];  Service: Cardiovascular;  Laterality: N/A;    Current Outpatient Prescriptions  Medication Sig Dispense Refill  . apixaban (ELIQUIS) 5 MG TABS tablet Take 1 tablet (5 mg total) by mouth 2 (two) times daily. 60 tablet 11  . carvedilol (COREG) 25 MG tablet Take 1 tablet (25 mg total) by mouth 2 (two) times daily with  a meal. 180 tablet 11  . clobetasol cream (TEMOVATE) 1.95 % Apply 1 application topically 2 (two) times daily as needed (for rash).     [name] desonide (DESOWEN) 0.05 % cream Apply 1 application topically 2 (two) times daily as needed (for rash).     [name] dexlansoprazole (DEXILANT) 60 MG capsule Take 1 capsule (60 mg total) by mouth daily. 30 capsule 3  . dofetilide (TIKOSYN) 500 MCG capsule TAKE 1 CAPSULE (500 MCG TOTAL) BY MOUTH 2 (TWO) TIMES DAILY. 180 capsule 3  . gabapentin (NEURONTIN) 100 MG capsule Take 100 mg by mouth 2 (two) times daily.     [name] ibuprofen (ADVIL,MOTRIN) 200 MG tablet Take 800 mg by mouth every 6 (six) hours as needed for headache (back pain).    . methimazole (TAPAZOLE) 10 MG tablet Take 1 tablet (10 mg total) by mouth daily. (Patient taking differently: Take 10 mg by mouth 2 (two) times daily. ) 30 tablet 1  . oxyCODONE-acetaminophen (PERCOCET) 10-325 MG per tablet Take 1 tablet by mouth every 4 (four) hours as needed for pain. 29 tablet 0  . sucralfate (CARAFATE) 1 GM/10ML suspension Take 10 mLs (1 g total) by mouth every 6 (six) hours. 420 mL 3  . triamcinolone ointment (KENALOG) 0.1 % Apply 1 application topically 2 (two) times daily as needed (for rash).   3   No current facility-administered medications for this encounter.     Allergies  Allergen Reactions  . Potassium Chloride Other (See Comments)    Dizziness and nausea     Social History   Social History  . Marital status: Legally Separated    Spouse name: N/A  . Number of children: 7  . Years of education: N/A   Occupational History  . unemployed Unemployed   Social History Main Topics  . Smoking status: Former Smoker    Packs/day: 1.00    Years: 15.00    Types: Cigarettes    Quit date: 04/04/2003  . Smokeless tobacco: Never Used  . Alcohol use No     Comment: formerly heavy ETOH,  now drinks a fifth of liquor every 2 weeks  . Drug use: No     Comment: previously used crack cocaine, quit 1999,  +  marijuana  . Sexual activity: Yes    Birth control/ protection: Condom   Other Topics Concern  . Not on file   Social History Narrative   Unemployed currently.  Previously worked as a Dealer.  Lives in [place].    Family History  Problem Relation Age of Onset  . Heart disease Mother   . Breast cancer Mother   . Lung cancer Father   . Diabetes Unknown   . Colon cancer Neg Hx     ROS- All systems are reviewed  and negative except as per the HPI above  Physical Exam: Vitals:   06/08/17 1011  BP: (!) 122/94  Weight: 272 lb 12.8 oz (123.7 kg)  Height: 5\' 11"  (1.803 m)    GEN- The patient is well appearing, alert and oriented x 3 today.   Head- normocephalic, atraumatic Eyes-  Sclera clear, conjunctiva pink Ears- hearing intact Oropharynx- clear Neck- supple, no JVP Lymph- no cervical lymphadenopathy Lungs- Clear to ausculation bilaterally, normal work of breathing Heart- Regular rate and rhythm, no murmurs, rubs or gallops, PMI not laterally displaced GI- soft, NT, ND, + BS Extremities- no clubbing, cyanosis, or edema MS- no significant deformity or atrophy Skin- no rash or lesion Psych- euthymic mood, full affect Neuro- strength and sensation are intact  EKG- Sinus brady at 59, pr int 154 ms, qrs int 110 ms, qtc 425 ms LAFB,  ST changes Epic records reviewed    Assessment and Plan: 1.  afib S/p second ablation 9/18 Maintaining SR Continue apixaban, stressed not to  miss  doses Continue carvedilol Continue dofetilide K+/mag both at good levels when checked 9/7  3. OSA Compliance with cpap encouraged  3. Nonobstructive CAD Medical management has been advised in the past Previous LHC 9/17, with nonobstructive disease  4. Sick sinus Normal pacemaker function when last evaluated with Dr. Rayann Heman   F/u with Dr. Rayann Heman 12/5  Geroge Baseman. Sirenity Shew, [facility] [address] [place], [place] [address] [phone]

## 2017-06-22 ENCOUNTER — Encounter (HOSPITAL_COMMUNITY): Payer: Self-pay

## 2017-06-30 ENCOUNTER — Encounter: Payer: Medicaid Other | Admitting: *Deleted

## 2017-07-01 ENCOUNTER — Other Ambulatory Visit: Payer: Self-pay | Admitting: Neurosurgery

## 2017-07-01 DIAGNOSIS — M48061 Spinal stenosis, lumbar region without neurogenic claudication: Secondary | ICD-10-CM

## 2017-07-02 ENCOUNTER — Encounter: Payer: Self-pay | Admitting: Cardiology

## 2017-07-05 ENCOUNTER — Telehealth: Payer: Self-pay | Admitting: Internal Medicine

## 2017-07-05 NOTE — Telephone Encounter (Signed)
° °   [facility] Pre-operative Risk Assessment    Request for surgical clearance:  1. What type of surgery is being performed? Lumber Myelogram  2. When is this surgery scheduled? TBD   3. Are there any medications that need to be held prior to surgery and how long? Eliquis 2 days prior   4. Practice name and name of physician performing surgery?  [facility]  5. What is your office phone and fax number? PH: [phone], FAX: [phone]  ATTN: [place]  6. Anesthesia type (None, local, MAC, general) ? Not listed    Derl Barrow 07/05/2017, 4:23 PM  _________________________________________________________________   (provider comments below)

## 2017-07-09 NOTE — Telephone Encounter (Signed)
    Chart reviewed as part of pre-operative protocol coverage. Patient was contacted 07/09/2017 in reference to pre-operative risk assessment for pending surgery as outlined below.  LIBERATO STANSBERY was last seen on 06/04/2017 by Roderic Palau, NP in Afib clinic.  Since that day, KIT MOLLETT has done well. No chest pain, shortness of breath or palpitations. DASI is 8.23 METS  Therefore, based on ACC/AHA guidelines, the patient would be at acceptable risk for the planned procedure without further cardiovascular testing.   Will route to pharmacy for anticoagulation review.   [place], PA 07/09/2017, 2:52 PM

## 2017-07-09 NOTE — Telephone Encounter (Signed)
Patient with diagnosis of AFib on Eliquis for anticoagulation.    Procedure: Lumbar Myelogram Date of procedure: TBD  CHADS2-VASc score of  3 (CHF, HTN, CAD)  **Also noted remote Hx of PE in 2004**  CrCl > 90 ml/min  Per office protocol, patient can hold Eliquis for 2 days prior to procedure.   Patient will not need bridging with Lovenox (enoxaparin) around procedure.  Should restart Eliquis day after rprocedure at discretion of procedure MD  Wesson Stith Rodriguez-Guzman PharmD, BCPS, [place] [address] [place] [address] 07/09/2017 4:10 PM

## 2017-07-12 NOTE — Telephone Encounter (Signed)
    Chart reviewed as part of pre-operative protocol coverage.  See note from [place] regarding anticoagulation. Vin Bhaghat PA-C has cleared the patient for procedure. Will route this note to requesting provider.  [name], PA-C  07/12/2017, 8:41 AM

## 2017-07-28 ENCOUNTER — Encounter: Payer: Self-pay | Admitting: Internal Medicine

## 2017-08-04 ENCOUNTER — Ambulatory Visit (INDEPENDENT_AMBULATORY_CARE_PROVIDER_SITE_OTHER): Payer: Medicaid Other | Admitting: Internal Medicine

## 2017-08-04 VITALS — BP 142/82 | HR 65 | Ht 71.0 in | Wt 280.0 lb

## 2017-08-04 DIAGNOSIS — Z950 Presence of cardiac pacemaker: Secondary | ICD-10-CM | POA: Diagnosis not present

## 2017-08-04 DIAGNOSIS — I495 Sick sinus syndrome: Secondary | ICD-10-CM

## 2017-08-04 DIAGNOSIS — I5042 Chronic combined systolic (congestive) and diastolic (congestive) heart failure: Secondary | ICD-10-CM | POA: Diagnosis not present

## 2017-08-04 DIAGNOSIS — I480 Paroxysmal atrial fibrillation: Secondary | ICD-10-CM | POA: Diagnosis not present

## 2017-08-04 DIAGNOSIS — I519 Heart disease, unspecified: Secondary | ICD-10-CM

## 2017-08-04 LAB — CUP PACEART INCLINIC DEVICE CHECK
Battery Impedance: 626 Ohm
Battery Remaining Longevity: 106 mo
Battery Voltage: 2.79 V
Brady Statistic RA Percent Paced: 15 %
Implantable Lead Implant Date: 20091023
Implantable Lead Location: 753859
Lead Channel Impedance Value: 441 Ohm
Lead Channel Sensing Intrinsic Amplitude: 1.4 mV
Lead Channel Setting Pacing Amplitude: 2 V
MDC IDC MSMT LEADCHNL RA PACING THRESHOLD AMPLITUDE: 0.5 V
MDC IDC MSMT LEADCHNL RA PACING THRESHOLD PULSEWIDTH: 0.4 ms
MDC IDC MSMT LEADCHNL RV IMPEDANCE VALUE: 67 Ohm
MDC IDC PG IMPLANT DT: 20091023
MDC IDC SESS DTM: 20181205133951

## 2017-08-04 NOTE — Progress Notes (Signed)
PCP: [facility], [facility] Primary Cardiologist: Dr Aaron Mose is a 56 y.o. male who presents today for routine electrophysiology followup.  Since his recent afib ablation, the patient reports doing very well.  he denies procedure related complications and is pleased with the results of the procedure.  He is very pleased with results of his procedure.  His SOB, dizziness, chest discomfort, and palpitations have all resolved.  Today, he denies symptoms of palpitations, chest pain, shortness of breath,  lower extremity edema, dizziness, presyncope, or syncope.  The patient is otherwise without complaint today.   Past Medical History:  Diagnosis Date  . Atrial fibrillation ([place])    s/p afib ablation  PVI 5/12 JA  . CAD -nonobstructive    S/P nstemi-type II cath 05/2008 - nonobs. dzs.  . DDD (degenerative disc disease)    evaluate by neurosurgery is ongoing  . Depression    ptsd  . Erectile dysfunction   . Headache(784.0)   . Hemorrhoids   . HTN (hypertension)   . Hyperlipemia   . Hyperthyroidism    Graves dz on methimazole and followed by [name]  . Obesity   . Pancreatitis   . Polysubstance abuse ([place])    cocaine, last used 1999  . PUD (peptic ulcer disease)    gastritis due to ETOH previously  . Pulmonary embolism ([place]) 04/2003   Hx of  . Rectal bleeding   . Sinus node dysfunction ([facility])    s/p MDT PPM - AAI - V port plugged  . Sleep apnea    uses CPAP [facility] SLEEP [facility] 2 YRS  . Systolic and diastolic CHF, chronic ([place])    EF 35% by most recent echo 09/19/10  . 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 ANTERIOR CERVICAL DECOMP/DISCECTOMY FUSION  12/15/2011   Procedure: ANTERIOR CERVICAL DECOMPRESSION/DISCECTOMY FUSION 1 LEVEL;  Surgeon: [name], MD;  Location: [facility];  Service: Neurosurgery;  Laterality: N/A;  Cervical Six-Seven Anterior Cervical Diskectomy fusion with Allograft and  Plating  . ATRIAL ABLATION SURGERY  12/2010   afib ablation by [name]  . ATRIAL FIBRILLATION ABLATION N/A 05/06/2017   Procedure: Atrial Fibrillation Ablation;  Surgeon: [place] Grayer, MD;  Location: [facility] CV LAB;  Service: Cardiovascular;  Laterality: N/A;  . BACK SURGERY    . CARDIAC CATHETERIZATION     CADIAC ABLATION [name] 12/2010  . CARDIAC CATHETERIZATION N/A 05/08/2016   Procedure: Left Heart Cath and Coronary Angiography;  Surgeon: Peter M Martinique, MD;  Location: [facility] CV LAB;  Service: Cardiovascular;  Laterality: N/A;  . Fingers removed from right hand.  2/84   traumatic work injury  . INSERT / REPLACE / REMOVE PACEMAKER     05/2008    . LUMBAR SPINE SURGERY  02/2009   Dr. Trenton Gammon  . PACEMAKER INSERTION  10/09   by Dr [name]  . TEE WITHOUT CARDIOVERSION N/A 05/05/2017   Procedure: TRANSESOPHAGEAL ECHOCARDIOGRAM (TEE);  Surgeon: Larey Dresser, MD;  Location: [facility] ENDOSCOPY;  Service: Cardiovascular;  Laterality: N/A;    ROS- all systems are personally reviewed and negatives except as per HPI above  Current Outpatient Medications  Medication Sig Dispense Refill  . apixaban (ELIQUIS) 5 MG TABS tablet Take 1 tablet (5 mg total) by mouth 2 (two) times daily. 60 tablet 11  . carvedilol (COREG) 25 MG tablet Take 1 tablet (25 mg  total) by mouth 2 (two) times daily with a meal. 180 tablet 11  . clobetasol cream (TEMOVATE) 5.36 % Apply 1 application topically 2 (two) times daily as needed (for rash).     [name] desonide (DESOWEN) 0.05 % cream Apply 1 application topically 2 (two) times daily as needed (for rash).     [name] dexlansoprazole (DEXILANT) 60 MG capsule Take 1 capsule (60 mg total) by mouth daily. 30 capsule 3  . dofetilide (TIKOSYN) 500 MCG capsule TAKE 1 CAPSULE (500 MCG TOTAL) BY MOUTH 2 (TWO) TIMES DAILY. 180 capsule 3  . doxepin (SINEQUAN) 100 MG capsule Take 100 mg by mouth.    . gabapentin (NEURONTIN) 100 MG capsule Take 100 mg by mouth 2 (two) times daily.     [name]  ibuprofen (ADVIL,MOTRIN) 200 MG tablet Take 800 mg by mouth every 6 (six) hours as needed for headache (back pain).    . methimazole (TAPAZOLE) 10 MG tablet Take 1 tablet (10 mg total) by mouth daily. (Patient taking differently: Take 10 mg by mouth 2 (two) times daily. ) 30 tablet 1  . oxyCODONE-acetaminophen (PERCOCET) 10-325 MG per tablet Take 1 tablet by mouth every 4 (four) hours as needed for pain. 29 tablet 0  . triamcinolone ointment (KENALOG) 0.1 % Apply 1 application topically 2 (two) times daily as needed (for rash).   3   No current facility-administered medications for this visit.     Physical Exam: Vitals:   08/04/17 0939  BP: (!) 142/82  Pulse: 65  Weight: 280 lb (127 kg)  Height: 5\' 11"  (1.803 m)    GEN- The patient is well appearing, alert and oriented x 3 today.   Head- normocephalic, atraumatic Eyes-  Sclera clear, conjunctiva pink Ears- hearing intact Oropharynx- clear Lungs- Clear to ausculation bilaterally, normal work of breathing Heart- Regular rate and rhythm, no murmurs, rubs or gallops, PMI not laterally displaced GI- soft, NT, ND, + BS Extremities- no clubbing, cyanosis, or edema  EKG tracing ordered today is personally reviewed and shows sinus rhythm, LAD, QTc 445 msec, nonspecific ST/T changes  Assessment and Plan:  1. Paroxysmal atrial fibrillation/ atach Doing well s/p ablation He prefers to continue tikosyn Could consider stopping tikosyn upon return, though given his symptomatology previously, would be reasonable to continue tikosyn long term chads2vasc score is at least 2 Continue long term anticoagualtion  2. Sick sinus syndrome .lpace  3. OSA Compliance with CPAP encouraged  4. Nonischemic CM EF 40-45% No changes  [facility] Return to see Butch Penny in the AF clinic in 3 months Dr [name] to see in 6 months  Return to see me in 3 months  Thompson Grayer MD, [facility] 08/04/2017 9:56 AM

## 2017-08-04 NOTE — Patient Instructions (Addendum)
Medication Instructions:  Your physician recommends that you continue on your current medications as directed. Please refer to the Current Medication list given to you today.   Labwork: None ordered   Testing/Procedures: None ordered   Follow-Up: Your physician recommends that you schedule a follow-up appointment in: 3 months with Ceasar Lund and 6 months with Dr [name]  Remote monitoring is used to monitor your  ICD from home. This monitoring reduces the number of office visits required to check your device to one time per year. It allows [name] to keep an eye on the functioning of your device to ensure it is working properly. You are scheduled for a device check from home on 11/03/17. You may send your transmission at any time that day. If you have a wireless device, the transmission will be sent automatically. After your physician reviews your transmission, you will receive a postcard with your next transmission date.     Any Other Special Instructions Will Be Listed Below (If Applicable).     If you need a refill on your cardiac medications before your next appointment, please call your pharmacy.

## 2017-08-05 ENCOUNTER — Ambulatory Visit
Admission: RE | Admit: 2017-08-05 | Discharge: 2017-08-05 | Disposition: A | Discharge status: Home / Self Care | Payer: Medicaid Other | Source: Ambulatory Visit | Attending: Neurosurgery | Admitting: Neurosurgery

## 2017-08-05 DIAGNOSIS — M48061 Spinal stenosis, lumbar region without neurogenic claudication: Secondary | ICD-10-CM

## 2017-08-05 MED ORDER — DIAZEPAM 5 MG PO TABS
10.0000 mg | ORAL_TABLET | Freq: Once | ORAL | Status: AC
  Administered 2017-08-05: 10 mg via ORAL

## 2017-08-05 MED ORDER — ONDANSETRON HCL 4 MG/2ML IJ SOLN: 4.0000 mg | Freq: Four times a day (QID) | INTRAMUSCULAR | Status: DC | PRN

## 2017-08-05 MED ORDER — IOPAMIDOL (ISOVUE-M 200) INJECTION 41%
15.0000 mL | Freq: Once | INTRAMUSCULAR | Status: AC
  Administered 2017-08-05: 15 mL via INTRATHECAL

## 2017-08-05 NOTE — Progress Notes (Signed)
Patient states he has been off Eliquis for at least the past two days.  States he no longer takes Doxepin.

## 2017-08-06 NOTE — Discharge Instructions (Signed)
° °  Myelogram Discharge Instructions  1. Go home and rest quietly for the next 24 hours.  It is important to lie flat for the next 24 hours.  Get up only to go to the restroom.  You may lie in the bed or on a couch on your back, your stomach, your left side or your right side.  You may have one pillow under your head.  You may have pillows between your knees while you are on your side or under your knees while you are on your back.  2. DO NOT drive today.  Recline the seat as far back as it will go, while still wearing your seat belt, on the way home.  3. You may get up to go to the bathroom as needed.  You may sit up for 10 minutes to eat.  You may resume your normal diet and medications unless otherwise indicated.  Drink lots of extra fluids today and tomorrow.  4. The incidence of headache, nausea, or vomiting is about 5% (one in 20 patients).  If you develop a headache, lie flat and drink plenty of fluids until the headache goes away.  Caffeinated beverages may be helpful.  If you develop severe nausea and vomiting or a headache that does not go away with flat bed rest, call [phone].  5. You may resume normal activities after your 24 hours of bed rest is over; however, do not exert yourself strongly or do any heavy lifting tomorrow. If when you get up you have a headache when standing, go back to bed and force fluids for another 24 hours.  6. Call your physician for a follow-up appointment.  The results of your myelogram will be sent directly to your physician by the following day.  7. If you have any questions or if complications develop after you arrive home, please call [phone].  Discharge instructions have been explained to the patient.  The patient, or the person responsible for the patient, fully understands these instructions.       MAY RESUME ELIQUIS TODAY.   May resume Doxepin on Dec. 7, 2018, after 9:30 am.

## 2017-08-16 ENCOUNTER — Telehealth: Payer: Self-pay | Admitting: Internal Medicine

## 2017-08-16 DIAGNOSIS — G4733 Obstructive sleep apnea (adult) (pediatric): Secondary | ICD-10-CM

## 2017-08-16 NOTE — Telephone Encounter (Signed)
We haven't seen him in a while. Suspect indoor winter heat and treatment for his heart condition may be making him drier. To get through holidays, suggest otc Biotene mouth rinse - swish and spit every night at bedtime and again in the morning. Please make him a routine ROV with me or NP for OSA.

## 2017-08-16 NOTE — Telephone Encounter (Signed)
Attempted to contact pt. No answer, no option to leave a message. Will try back.  

## 2017-08-16 NOTE — Telephone Encounter (Signed)
Spoke with pt, he states the CPAP is making his mouth so dry. It has been going on for about a month and puts the humidifier is on high setting. He contacted [employer] about the chamber leak  because he noticed water on the floor. He wakes up 3 times during the night to get something to drink. He slept without the CPAP and he did not experience dry mouth. He wanted to let CY know. Please advise.

## 2017-08-17 NOTE — Telephone Encounter (Signed)
atc pt X2, no answer and no vm.

## 2017-08-18 NOTE — Telephone Encounter (Signed)
Ok replacement water chamber for CPAP humidiifer  Dx OSA

## 2017-08-18 NOTE — Telephone Encounter (Signed)
Spoke with patient. He had an appointment with Jonni Sanger at [place] this morning to troubleshoot his CPAP. He stated that the water chamber has a leak and will leak water on his floor each night. [employer] is willing to replace this but they need an order first.   Informed patient that with OSA he needs to have a follow up at least once a year. He has been scheduled with TP for 09/10/17 at 930. He is aware of appointment.   CY, please advise if it is ok to send in an order for a replacement water chamber since he has agreed to come in. Thanks!

## 2017-08-18 NOTE — Telephone Encounter (Signed)
Attempted to contact pt. No answer, no option to leave a message. Will try back.  

## 2017-08-18 NOTE — Telephone Encounter (Signed)
Pt calling back in regards to issue with CPAP.

## 2017-08-19 NOTE — Telephone Encounter (Signed)
Pt is calling back missing the call [phone]

## 2017-08-19 NOTE — Telephone Encounter (Signed)
lmtcb for pt.  

## 2017-08-19 NOTE — Telephone Encounter (Signed)
lmtcb x1 for pt. 

## 2017-08-19 NOTE — Telephone Encounter (Signed)
Patient returned call, CB [phone].

## 2017-08-25 ENCOUNTER — Telehealth: Payer: Self-pay | Admitting: Internal Medicine

## 2017-08-25 NOTE — Telephone Encounter (Signed)
Spoke with Jonni Sanger and states Medicaid will not replace tubing or water tanks, it is not a covered item. He wants [name] to know this because even if we send an order, they will not pay. Pt has received many free tubing and tanks and he will not be able to provide anymore. He wanted to let us know because he states pt will call us to get an order but this will not help. Pt has to pay out of pocket for supplies. FYI CY.

## 2017-08-26 IMAGING — DX DG CHEST 2V
2 series · 2 of 2 positions shown · non-contrast
Comparison: 11/12/2015

CLINICAL DATA: Chest pain and left arm pain.

EXAM:
CHEST  2 VIEW

[chest pa]
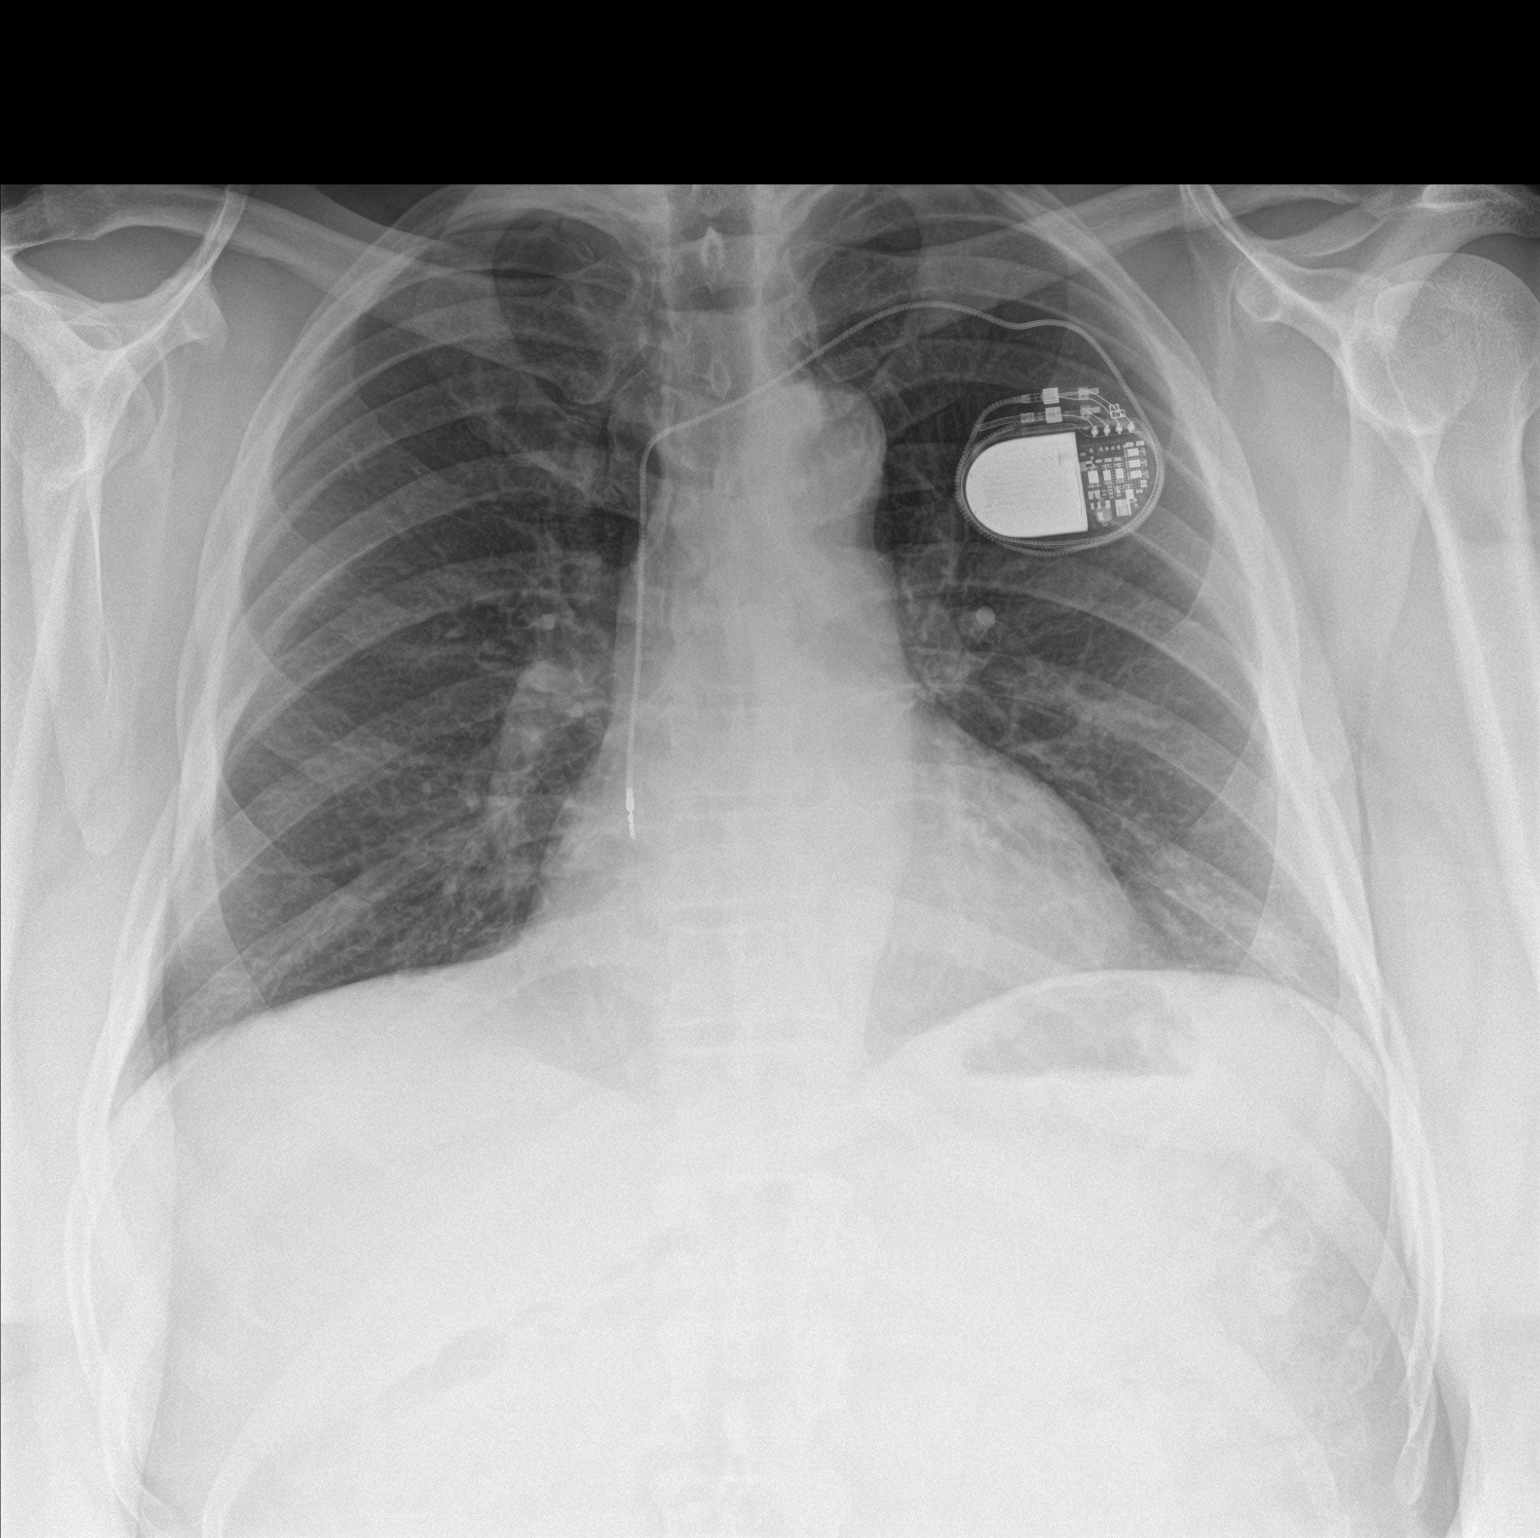

[chest lat]
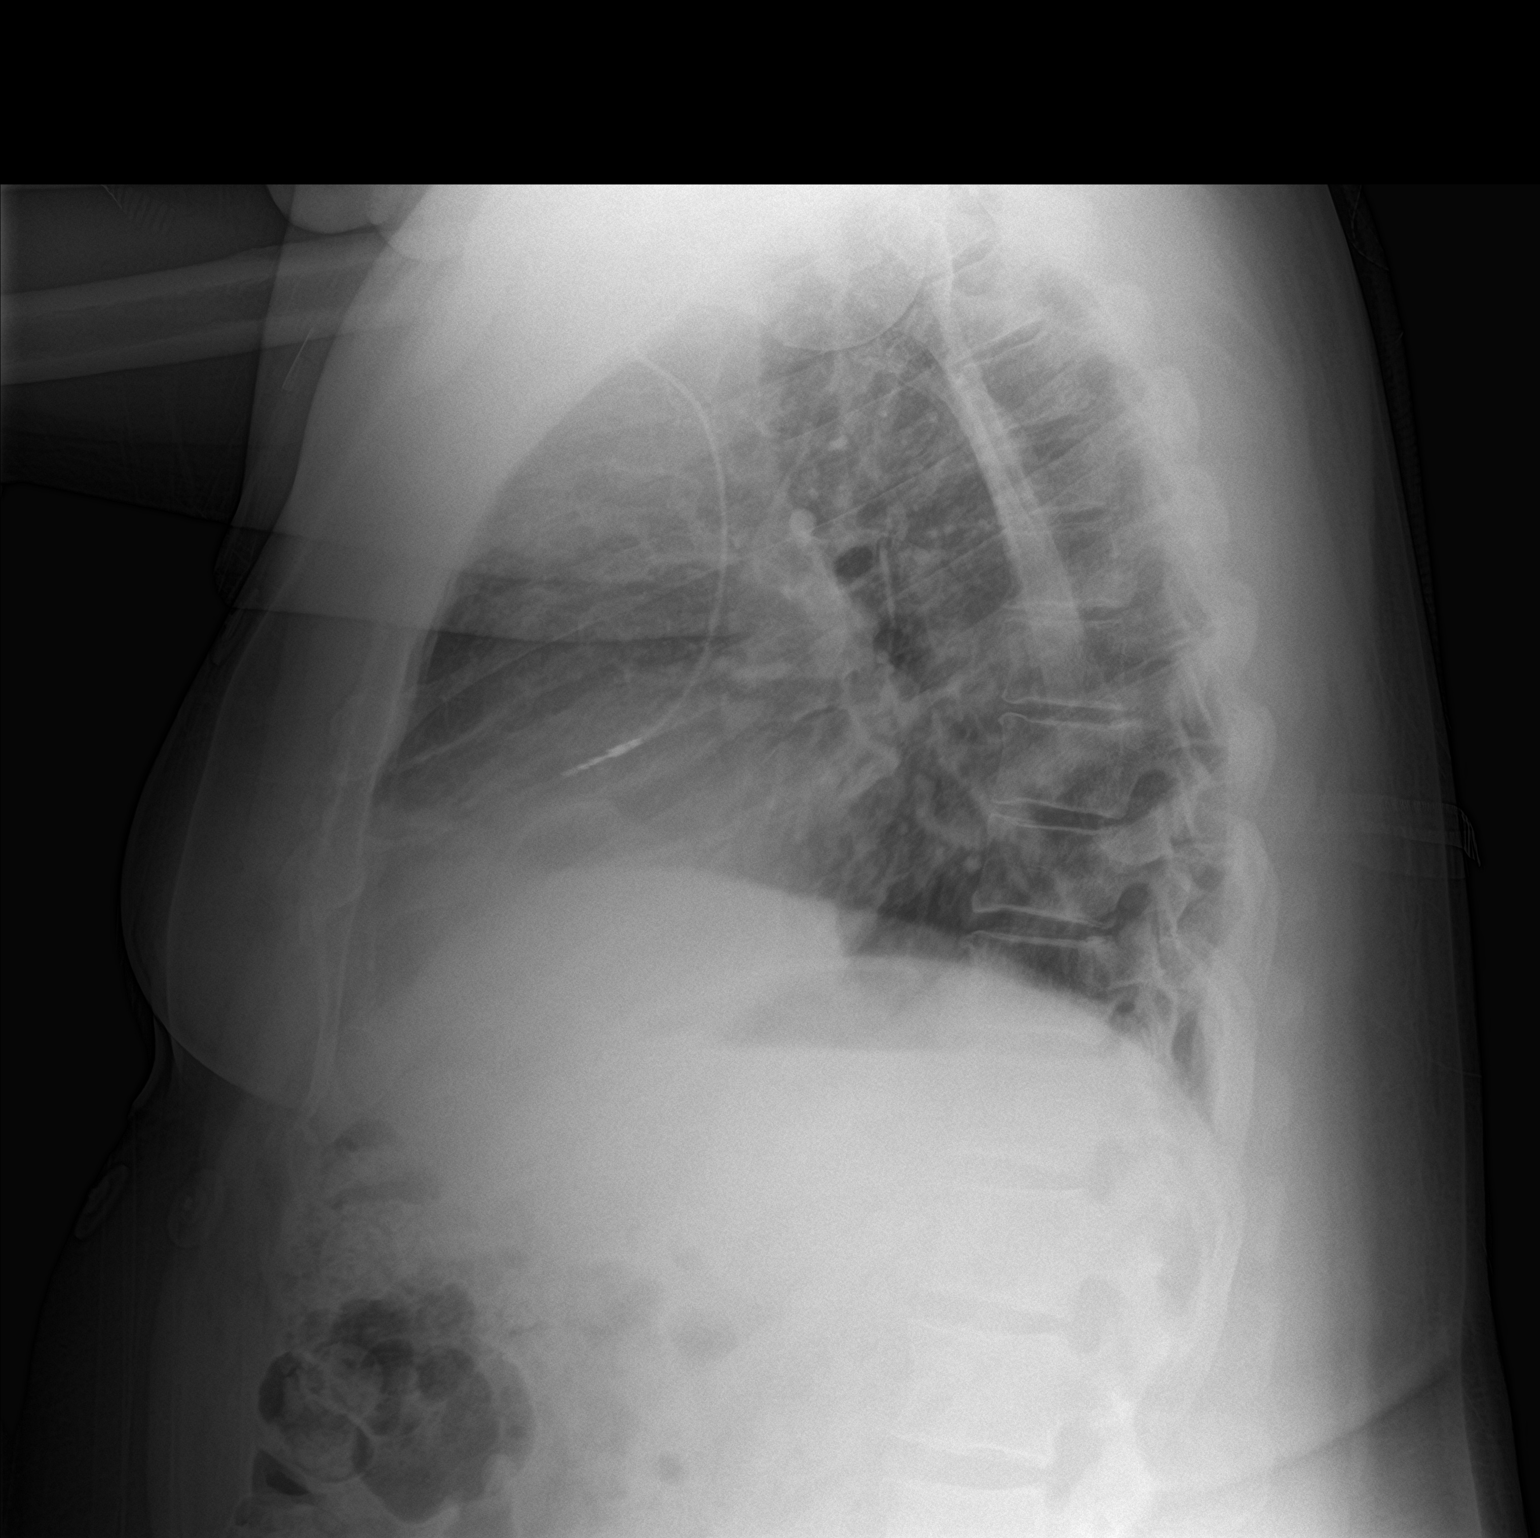

[2 of 2 positions shown; findings below may reference images not displayed]

FINDINGS: The heart size and pulmonary vascularity are normal and the lungs
are clear. Single lead pacemaker in place. No acute bone
abnormality.

Calcification in the arch of the aorta.
IMPRESSION: No active cardiopulmonary disease.

Aortic atherosclerosis.

## 2017-09-01 ENCOUNTER — Other Ambulatory Visit: Payer: Self-pay | Admitting: Gastroenterology

## 2017-09-09 ENCOUNTER — Telehealth: Payer: Self-pay | Admitting: *Deleted

## 2017-09-09 NOTE — Telephone Encounter (Signed)
   [facility] Pre-operative Risk Assessment    Request for surgical clearance:  1. What type of surgery is being performed? Lumbar Spine L3 & L4 Injection scheduled   2. When is this surgery scheduled? 10/07/17  3. Are there any medications that need to be held prior to surgery and how long? ELIQUIS FOR 3 DAYS PRIOR   4. Practice name and name of physician performing surgery? [facility]   5. What is your office phone and fax number? Ph: [phone]  Fax: [phone]   6. Anesthesia type (None, local, MAC, general) ? N/A   Jeanann Lewandowsky 09/09/2017, 11:11 AM  _________________________________________________________________   (provider comments below)

## 2017-09-10 ENCOUNTER — Encounter: Payer: Self-pay | Admitting: Adult Health

## 2017-09-10 ENCOUNTER — Ambulatory Visit: Payer: Medicaid Other | Admitting: Adult Health

## 2017-09-10 DIAGNOSIS — G4733 Obstructive sleep apnea (adult) (pediatric): Secondary | ICD-10-CM | POA: Diagnosis not present

## 2017-09-10 DIAGNOSIS — J309 Allergic rhinitis, unspecified: Secondary | ICD-10-CM

## 2017-09-10 NOTE — Patient Instructions (Addendum)
Keep up the good work Continue on CPAP at bedtime Follow-up with home care company  as planned today to have machine serviced. Remain active Work on healthy weight Zyrtec 10mg  At bedtime  As needed  Drainage  Saline nasal rinses As needed   Saline nasal gel As needed   Follow-up in 1 year with Dr. Annamaria Boots and as needed

## 2017-09-10 NOTE — Assessment & Plan Note (Signed)
Moderate OSA contrrolled on CPAP   Plan  Patient Instructions  Keep up the good work Continue on CPAP at bedtime Follow-up with home care company  as planned today to have machine serviced. Remain active Work on healthy weight Follow-up in 1 year with Dr. Annamaria Boots and as needed

## 2017-09-10 NOTE — Progress Notes (Signed)
@Patient  ID: [name], male    DOB: 11-29-1960, 57 y.o.   MRN: [id number]  Chief Complaint  Patient presents with  . Follow-up    OSA    Referring provider: [facility]  HPI: 57 year old male followed for obstructive sleep apnea Patient has underlying coronary disease congestive heart failure and atrial fib.  TEST  NPSG 2010- AHI 21/ hr  09/10/2017 Follow up : OSA  Patient presents for yearly follow-up for sleep apnea.  He was last seen March 2017.  Patient has underlying moderate sleep apnea.  He is on CPAP at bedtime.  Patient says he is wearing his machine each night.  He has been having a few difficulties with his machine with humidification.  He is going to his DME company to have it serviced today.  Download shows excellent compliance with average usage at 8 hours.  Patient is on AutoSet 10-15 cm H2O.  AHI 1.3.  Minimum leaks. Complains of nasal and mouth dryness . Nasal drainage and congestion .     Allergies  Allergen Reactions  . Potassium Chloride Other (See Comments)    Dizziness and nausea     Immunization History  Administered Date(s) Administered  . Influenza Split 08/31/2009, 06/02/2011, 05/13/2012, 05/31/2017  . Influenza Whole 08/30/2007, 07/18/2009  . Influenza, Seasonal, Injecte, Preservative Fre 05/02/2014, 08/05/2015  . Influenza,inj,Quad PF,6+ Mos 05/24/2013  . Influenza-Unspecified 06/09/2011, 09/13/2013  . Pneumococcal-Unspecified 08/31/2009  . Td 07/18/2009  . Tdap 08/31/2009, 10/29/2015    Past Medical History:  Diagnosis Date  . Atrial fibrillation ([place])    s/p afib ablation  PVI 5/12 JA  . CAD -nonobstructive    S/P nstemi-type II cath 05/2008 - nonobs. dzs.  . DDD (degenerative disc disease)    evaluate by neurosurgery is ongoing  . Depression    ptsd  . Erectile dysfunction   . Headache(784.0)   . Hemorrhoids   . HTN (hypertension)   . Hyperlipemia   . Hyperthyroidism    [place] dz on methimazole and  followed by [name]  . Obesity   . Pancreatitis   . Polysubstance abuse ([place])    cocaine, last used 1999  . PUD (peptic ulcer disease)    gastritis due to ETOH previously  . Pulmonary embolism ([place]) 04/2003   Hx of  . Rectal bleeding   . Sinus node dysfunction ([facility])    s/p MDT PPM - AAI - V port plugged  . Sleep apnea    uses CPAP [facility] 2 YRS  . Systolic and diastolic CHF, chronic ([place])    EF 35% by most recent echo 09/19/10  . 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 Packs/day: 1.00  . Years: 15.00  . Pack years: 15.00  . Types: Cigarettes  . Last attempt to quit: 04/04/2003  . Years since quitting: 14.4  Smokeless Tobacco Never Used   Counseling given: Not Answered   Outpatient Encounter Medications as of 09/10/2017  Medication Sig  . apixaban (ELIQUIS) 5 MG TABS tablet Take 1 tablet (5 mg total) by mouth 2 (two) times daily.  . carvedilol (COREG) 25 MG tablet Take 1 tablet (25 mg total) by mouth 2 (two) times daily with a meal.  . clobetasol cream (TEMOVATE) 0.86 % Apply 1 application topically 2 (two) times daily as needed (for rash).   [name] desonide (DESOWEN) 0.05 %  cream Apply 1 application topically 2 (two) times daily as needed (for rash).   . DEXILANT 60 MG capsule TAKE 1 CAPSULE BY MOUTH EVERY DAY  . dofetilide (TIKOSYN) 500 MCG capsule TAKE 1 CAPSULE (500 MCG TOTAL) BY MOUTH 2 (TWO) TIMES DAILY.  [name] doxepin (SINEQUAN) 100 MG capsule Take 100 mg by mouth.  . gabapentin (NEURONTIN) 100 MG capsule Take 100 mg by mouth 2 (two) times daily.   [name] ibuprofen (ADVIL,MOTRIN) 200 MG tablet Take 800 mg by mouth every 6 (six) hours as needed for headache (back pain).  . methimazole (TAPAZOLE) 10 MG tablet Take 1 tablet (10 mg total) by mouth daily. (Patient taking differently: Take 10 mg by mouth 2 (two) times daily. )  . oxyCODONE-acetaminophen (PERCOCET) 10-325 MG per tablet  Take 1 tablet by mouth every 4 (four) hours as needed for pain.  [name] triamcinolone ointment (KENALOG) 0.1 % Apply 1 application topically 2 (two) times daily as needed (for rash).    No facility-administered encounter medications on file as of 09/10/2017.      Review of Systems  Constitutional:   No  weight loss, night sweats,  Fevers, chills, fatigue, or  lassitude.  HEENT:   No headaches,  Difficulty swallowing,  Tooth/dental problems, or  Sore throat,                No sneezing, itching, ear ache,  +nasal congestion, post nasal drip,   CV:  No chest pain,  Orthopnea, PND, swelling in lower extremities, anasarca, dizziness, palpitations, syncope.   GI  No heartburn, indigestion, abdominal pain, nausea, vomiting, diarrhea, change in bowel habits, loss of appetite, bloody stools.   Resp: No shortness of breath with exertion or at rest.  No excess mucus, no productive cough,  No non-productive cough,  No coughing up of blood.  No change in color of mucus.  No wheezing.  No chest wall deformity  Skin: no rash or lesions.  GU: no dysuria, change in color of urine, no urgency or frequency.  No flank pain, no hematuria   MS:  No joint pain or swelling.  No decreased range of motion.  No back pain.    Physical Exam  BP 138/82 (BP Location: Left Arm, Cuff Size: Large)   Pulse 63   Ht 5\' 11"  (1.803 m)   Wt 284 lb 3.2 oz (128.9 kg)   SpO2 96%   BMI 39.64 kg/m   GEN: A/Ox3; pleasant , NAD, well nourished    HEENT:  [place]/AT,  EACs-clear, TMs-wnl, NOSE-clear, THROAT-clear, no lesions, no postnasal drip or exudate noted.   NECK:  Supple w/ fair ROM; no JVD; normal carotid impulses w/o bruits; no thyromegaly or nodules palpated; no lymphadenopathy.    RESP  Clear  P & A; w/o, wheezes/ rales/ or rhonchi. no accessory muscle use, no dullness to percussion  CARD:  RRR, no m/r/g, no peripheral edema, pulses intact, no cyanosis or clubbing.  GI:   Soft & nt; nml bowel sounds; no organomegaly  or masses detected.   Musco: Warm bil, no deformities or joint swelling noted.   Neuro: alert, no focal deficits noted.    Skin: Warm, no lesions or rashes    Lab Results:  CBC   Imaging: No results found.   Assessment & Plan:   Obstructive sleep apnea- on C-pap Moderate OSA contrrolled on CPAP   Plan  Patient Instructions  Keep up the good work Continue on CPAP at bedtime Follow-up with home care  company  as planned today to have machine serviced. Remain active Work on healthy weight Follow-up in 1 year with Dr. Annamaria Boots and as needed      Allergic rhinitis Zyrtec and saline nasal As needed    Obesity Wt loss      Rexene Edison, NP 09/10/2017

## 2017-09-10 NOTE — Assessment & Plan Note (Signed)
Zyrtec and saline nasal As needed

## 2017-09-10 NOTE — Telephone Encounter (Signed)
Request is for recommendations regarding holding anticoagulation.  Request will be sent to our anticoagulation clinic for further recommendations regarding Apixaban. Richardson Dopp, PA-C    09/10/2017 12:10 PM

## 2017-09-10 NOTE — Assessment & Plan Note (Signed)
Wt loss  

## 2017-09-13 NOTE — Telephone Encounter (Signed)
Pt takes Eliquis for afib with CHADS2VASc score of 3 (CHF, CAD, HTN) and history of PE (2004). Renal function is normal. Ok to hold Eliquis for 3 days prior to procedure. Clearance faxed to below number.

## 2017-09-25 ENCOUNTER — Other Ambulatory Visit: Payer: Self-pay | Admitting: Internal Medicine

## 2017-09-28 ENCOUNTER — Telehealth: Payer: Self-pay | Admitting: Internal Medicine

## 2017-09-28 NOTE — Telephone Encounter (Signed)
[facility] office calling, asks that you resend the clearance via fax. See telephone encounter from 09-09-17  [facility] Pre-operative Risk Assessment    Request for surgical clearance:  1. What type of surgery is being performed? Lumbar Spine L3 & L4 Injection scheduled   2. When is this surgery scheduled? 10/07/17  3. Are there any medications that need to be held prior to surgery and how long? ELIQUIS FOR 3 DAYS PRIOR   4. Practice name and name of physician performing surgery? [facility]   5. What is your office phone and fax number? Ph: [phone]  Fax: [phone]   6. Anesthesia type (None, local, MAC, general) ? N/A   Jeanann Lewandowsky 09/09/2017, 11:11 AM  _________________________________________________________________

## 2017-09-28 NOTE — Telephone Encounter (Signed)
This was addressed on 09/09/17. Verified fax number and faxed to Dr. Donell Sievert office at [phone]. Left message on Dr. Donell Sievert nurse voicemail to call if she does not receive fax.

## 2017-09-30 ENCOUNTER — Other Ambulatory Visit: Payer: Self-pay | Admitting: Internal Medicine

## 2017-10-25 ENCOUNTER — Telehealth: Payer: Self-pay

## 2017-10-25 NOTE — Telephone Encounter (Signed)
I have done a Dofetilide PA over the phone with Katie at [facility]. Per Joellen Jersey we can call [employer] at [phone] in 24 hours for decision.

## 2017-11-03 ENCOUNTER — Ambulatory Visit (HOSPITAL_COMMUNITY): Payer: Medicaid Other | Admitting: Nurse Practitioner

## 2017-11-03 ENCOUNTER — Encounter: Payer: Medicaid Other | Admitting: *Deleted

## 2017-11-03 ENCOUNTER — Telehealth: Payer: Self-pay | Admitting: Cardiology

## 2017-11-03 NOTE — Telephone Encounter (Signed)
Spoke with pt and reminded pt of remote transmission that is due today. Pt verbalized understanding.   

## 2017-11-04 ENCOUNTER — Encounter: Payer: Self-pay | Admitting: Cardiology

## 2017-11-08 ENCOUNTER — Ambulatory Visit (INDEPENDENT_AMBULATORY_CARE_PROVIDER_SITE_OTHER): Payer: Medicaid Other | Admitting: *Deleted

## 2017-11-08 DIAGNOSIS — I495 Sick sinus syndrome: Secondary | ICD-10-CM

## 2017-11-08 NOTE — Progress Notes (Signed)
Remote pacemaker transmission.   

## 2017-11-10 ENCOUNTER — Encounter: Payer: Self-pay | Admitting: Cardiology

## 2017-11-10 NOTE — Progress Notes (Signed)
Letter  

## 2017-11-11 LAB — CUP PACEART REMOTE DEVICE CHECK
Implantable Lead Location: 753859
Implantable Pulse Generator Implant Date: 20091023
Lead Channel Impedance Value: 487 Ohm
Lead Channel Impedance Value: 67 Ohm
Lead Channel Setting Pacing Amplitude: 2 V
MDC IDC LEAD IMPLANT DT: 20091023
MDC IDC MSMT BATTERY IMPEDANCE: 674 Ohm
MDC IDC MSMT BATTERY REMAINING LONGEVITY: 105 mo
MDC IDC MSMT BATTERY VOLTAGE: 2.78 V
MDC IDC SESS DTM: 20190311144315
MDC IDC STAT BRADY RA PERCENT PACED: 1 %

## 2017-11-15 ENCOUNTER — Encounter (HOSPITAL_COMMUNITY): Payer: Self-pay | Admitting: Nurse Practitioner

## 2017-11-15 ENCOUNTER — Ambulatory Visit (HOSPITAL_COMMUNITY)
Admission: RE | Admit: 2017-11-15 | Discharge: 2017-11-15 | Disposition: A | Discharge status: Home / Self Care | Payer: Medicaid Other | Source: Ambulatory Visit | Attending: Nurse Practitioner | Admitting: Nurse Practitioner

## 2017-11-15 VITALS — BP 126/68 | HR 70 | Ht 71.0 in | Wt 274.2 lb

## 2017-11-15 DIAGNOSIS — Z8249 Family history of ischemic heart disease and other diseases of the circulatory system: Secondary | ICD-10-CM | POA: Diagnosis not present

## 2017-11-15 DIAGNOSIS — Z87891 Personal history of nicotine dependence: Secondary | ICD-10-CM | POA: Diagnosis not present

## 2017-11-15 DIAGNOSIS — Z79899 Other long term (current) drug therapy: Secondary | ICD-10-CM | POA: Insufficient documentation

## 2017-11-15 DIAGNOSIS — Z8711 Personal history of peptic ulcer disease: Secondary | ICD-10-CM | POA: Insufficient documentation

## 2017-11-15 DIAGNOSIS — N529 Male erectile dysfunction, unspecified: Secondary | ICD-10-CM | POA: Insufficient documentation

## 2017-11-15 DIAGNOSIS — Z955 Presence of coronary angioplasty implant and graft: Secondary | ICD-10-CM | POA: Diagnosis not present

## 2017-11-15 DIAGNOSIS — I252 Old myocardial infarction: Secondary | ICD-10-CM | POA: Insufficient documentation

## 2017-11-15 DIAGNOSIS — E0500 Thyrotoxicosis with diffuse goiter without thyrotoxic crisis or storm: Secondary | ICD-10-CM | POA: Diagnosis not present

## 2017-11-15 DIAGNOSIS — I2510 Atherosclerotic heart disease of native coronary artery without angina pectoris: Secondary | ICD-10-CM | POA: Insufficient documentation

## 2017-11-15 DIAGNOSIS — Z888 Allergy status to other drugs, medicaments and biological substances status: Secondary | ICD-10-CM | POA: Insufficient documentation

## 2017-11-15 DIAGNOSIS — E785 Hyperlipidemia, unspecified: Secondary | ICD-10-CM | POA: Insufficient documentation

## 2017-11-15 DIAGNOSIS — Z86711 Personal history of pulmonary embolism: Secondary | ICD-10-CM | POA: Insufficient documentation

## 2017-11-15 DIAGNOSIS — I110 Hypertensive heart disease with heart failure: Secondary | ICD-10-CM | POA: Diagnosis not present

## 2017-11-15 DIAGNOSIS — I5042 Chronic combined systolic (congestive) and diastolic (congestive) heart failure: Secondary | ICD-10-CM | POA: Diagnosis not present

## 2017-11-15 DIAGNOSIS — G4733 Obstructive sleep apnea (adult) (pediatric): Secondary | ICD-10-CM | POA: Diagnosis not present

## 2017-11-15 DIAGNOSIS — E669 Obesity, unspecified: Secondary | ICD-10-CM | POA: Diagnosis not present

## 2017-11-15 DIAGNOSIS — Z981 Arthrodesis status: Secondary | ICD-10-CM | POA: Insufficient documentation

## 2017-11-15 DIAGNOSIS — Z7902 Long term (current) use of antithrombotics/antiplatelets: Secondary | ICD-10-CM | POA: Insufficient documentation

## 2017-11-15 DIAGNOSIS — Z9889 Other specified postprocedural states: Secondary | ICD-10-CM | POA: Insufficient documentation

## 2017-11-15 DIAGNOSIS — Z950 Presence of cardiac pacemaker: Secondary | ICD-10-CM | POA: Insufficient documentation

## 2017-11-15 DIAGNOSIS — I480 Paroxysmal atrial fibrillation: Secondary | ICD-10-CM | POA: Diagnosis not present

## 2017-11-15 LAB — BASIC METABOLIC PANEL
Anion gap: 8 (ref 5–15)
BUN: 11 mg/dL (ref 6–20)
CALCIUM: 8.9 mg/dL (ref 8.9–10.3)
CO2: 25 mmol/L (ref 22–32)
CREATININE: 1.01 mg/dL (ref 0.61–1.24)
Chloride: 106 mmol/L (ref 101–111)
GFR calc Af Amer: >60 mL/min (ref >60)
GFR calc non Af Amer: >60 mL/min (ref >60)
GLUCOSE: 99 mg/dL (ref 65–99)
Potassium: 3.7 mmol/L (ref 3.5–5.1)
Sodium: 139 mmol/L (ref 135–145)

## 2017-11-15 LAB — MAGNESIUM: Magnesium: 2 mg/dL (ref 1.7–2.4)

## 2017-11-15 NOTE — Progress Notes (Signed)
Primary Care Physician: [facility] Referring Physician: Dr. Letta Pate is a 57 y.o. male with h/o PPM, s/p afib ablation by Dr. Rayann Heman in 2012.  He continued to have atrial arrhythmias.  He had atrial flutter/ atach./afib with very fast ventricular rates and symptoms of presyncope.  He felt that episodes have worsened. He is on tikosyn without relief.He has had  LHC in 2017. He did not have any obstructive lesions, but did have a NSTEMI in 2009.  Medical therapy was advised. He was scheduled for ablation in past but  cancelled procedure.  He was admitted to hospital 8/4 thru 8/5 with acute on chronic diastolic HF and Afib with RVR. He followed up in afib clinic and  expressed desire to have second ablation. This was scheduled for pt and he went on to have this done 05/2017 with Dr. Rayann Heman. He is in afib clinic for f/u ablation ,06/08/17. He has not noted any afib. No swallowing difficulties. Just mentions some soreness of rt groin.He continues on Tikosyn and eliquis without missed doses.  F/u with afib clinic, 3/18. He saw Dr. Rayann Heman in December 2018 and was doing well staying in [place]. He feels well and has not noted any afib. Continues on amiodarone.  Today, he denies symptoms of palpitations, chest pain, shortness of breath, orthopnea, PND, lower extremity edema, dizziness, presyncope, syncope, or neurologic sequela. Positive for left arm pain. The patient is tolerating medications without difficulties and is otherwise without complaint today.   Past Medical History:  Diagnosis Date  . Atrial fibrillation ([place])    s/p afib ablation  PVI 5/12 JA  . CAD -nonobstructive    S/P nstemi-type II cath 05/2008 - nonobs. dzs.  . DDD (degenerative disc disease)    evaluate by neurosurgery is ongoing  . Depression    ptsd  . Erectile dysfunction   . Headache(784.0)   . Hemorrhoids   . HTN (hypertension)   . Hyperlipemia   . Hyperthyroidism    Graves dz on  methimazole and followed by [name]  . Obesity   . Pancreatitis   . Polysubstance abuse ([place])    cocaine, last used 1999  . PUD (peptic ulcer disease)    gastritis due to ETOH previously  . Pulmonary embolism ([place]) 04/2003   Hx of  . Rectal bleeding   . Sinus node dysfunction ([facility])    s/p MDT PPM - AAI - V port plugged  . Sleep apnea    uses CPAP [facility] 2 YRS  . Systolic and diastolic CHF, chronic ([place])    EF 35% by most recent echo 09/19/10  . 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 ANTERIOR CERVICAL DECOMP/DISCECTOMY FUSION  12/15/2011   Procedure: ANTERIOR CERVICAL DECOMPRESSION/DISCECTOMY FUSION 1 LEVEL;  Surgeon: [name], MD;  Location: [facility];  Service: Neurosurgery;  Laterality: N/A;  Cervical Six-Seven Anterior Cervical Diskectomy fusion with Allograft and Plating  . ATRIAL ABLATION SURGERY  12/2010   afib ablation by [name]  . ATRIAL FIBRILLATION ABLATION N/A 05/06/2017   Procedure: Atrial Fibrillation Ablation;  Surgeon: Thompson Grayer, MD;  Location: [facility];  Service: Cardiovascular;  Laterality: N/A;  . BACK SURGERY    . CARDIAC CATHETERIZATION     CADIAC ABLATION [name] 12/2010  . CARDIAC CATHETERIZATION N/A 05/08/2016   Procedure: Left Heart Cath and  Coronary Angiography;  Surgeon: Peter M Martinique, MD;  Location: [facility];  Service: Cardiovascular;  Laterality: N/A;  . Fingers removed from right hand.  2/84   traumatic work injury  . INSERT / REPLACE / REMOVE PACEMAKER     05/2008    . LUMBAR SPINE SURGERY  02/2009   Dr. Trenton Gammon  . PACEMAKER INSERTION  10/09   by Dr [name]  . TEE WITHOUT CARDIOVERSION N/A 05/05/2017   Procedure: TRANSESOPHAGEAL ECHOCARDIOGRAM (TEE);  Surgeon: Larey Dresser, MD;  Location[facility];  Service: Cardiovascular;  Laterality: N/A;    Current Outpatient Medications  Medication Sig Dispense Refill  . carvedilol (COREG) 25 MG  tablet TAKE 1 TABLET (25 MG TOTAL) BY MOUTH 2 (TWO) TIMES DAILY WITH A MEAL. 180 tablet 3  . clobetasol cream (TEMOVATE) 9.98 % Apply 1 application topically 2 (two) times daily as needed (for rash).     [name] desonide (DESOWEN) 0.05 % cream Apply 1 application topically 2 (two) times daily as needed (for rash).     . DEXILANT 60 MG capsule TAKE 1 CAPSULE BY MOUTH EVERY DAY 30 capsule 3  . dofetilide (TIKOSYN) 500 MCG capsule TAKE 1 CAPSULE (500 MCG TOTAL) BY MOUTH 2 (TWO) TIMES DAILY. 180 capsule 3  . doxepin (SINEQUAN) 100 MG capsule Take 100 mg by mouth.    Arne Cleveland 5 MG TABS tablet TAKE 1 TABLET (5 MG TOTAL) BY MOUTH 2 (TWO) TIMES DAILY. 60 tablet 9  . gabapentin (NEURONTIN) 100 MG capsule Take 100 mg by mouth 2 (two) times daily.     . hydrocortisone cream 0.5 % Apply 1 application topically 2 (two) times daily.    [name] ibuprofen (ADVIL,MOTRIN) 200 MG tablet Take 800 mg by mouth every 6 (six) hours as needed for headache (back pain).    . methimazole (TAPAZOLE) 10 MG tablet Take 1 tablet (10 mg total) by mouth daily. 30 tablet 1  . oxyCODONE-acetaminophen (PERCOCET) 10-325 MG per tablet Take 1 tablet by mouth every 4 (four) hours as needed for pain. 29 tablet 0  . triamcinolone ointment (KENALOG) 0.1 % Apply 1 application topically 2 (two) times daily as needed (for rash).   3   No current facility-administered medications for this encounter.     Allergies  Allergen Reactions  . Potassium Chloride Other (See Comments)    Dizziness and nausea     Social History   Socioeconomic History  . Marital status: Legally Separated    Spouse name: Not on file  . Number of children: 7  . Years of education: Not on file  . Highest education level: Not on file  Social Needs  . Financial resource strain: Not on file  . Food insecurity - worry: Not on file  . Food insecurity - inability: Not on file  . Transportation needs - medical: Not on file  . Transportation needs - non-medical: Not on file    Occupational History  . Occupation: unemployed    Fish farm manager: UNEMPLOYED  Tobacco Use  . Smoking status: Former Smoker    Packs/day: 1.00    Years: 15.00    Pack years: 15.00    Types: Cigarettes    Last attempt to quit: 04/04/2003    Years since quitting: 14.6  . Smokeless tobacco: Never Used  Substance and Sexual Activity  . Alcohol use: No    Comment: formerly heavy ETOH,  now drinks a fifth of liquor every 2 weeks  . Drug use: No  Comment: previously used crack cocaine, quit 1999,  + marijuana  . Sexual activity: Yes    Birth control/protection: Condom  Other Topics Concern  . Not on file  Social History Narrative   Unemployed currently.  Previously worked as a Dealer.  Lives in [place].    Family History  Problem Relation Age of Onset  . Heart disease Mother   . Breast cancer Mother   . Lung cancer Father   . Diabetes Unknown   . Colon cancer Neg Hx     ROS- All systems are reviewed and negative except as per the HPI above  Physical Exam: Vitals:   11/15/17 0925  BP: 126/68  Pulse: 70  Weight: 274 lb 3.2 oz (124.4 kg)  Height: 5\' 11"  (1.803 m)    GEN- The patient is well appearing, alert and oriented x 3 today.   Head- normocephalic, atraumatic Eyes-  Sclera clear, conjunctiva pink Ears- hearing intact Oropharynx- clear Neck- supple, no JVP Lymph- no cervical lymphadenopathy Lungs- Clear to ausculation bilaterally, normal work of breathing Heart- Regular rate and rhythm, no murmurs, rubs or gallops, PMI not laterally displaced GI- soft, NT, ND, + BS Extremities- no clubbing, cyanosis, or edema MS- no significant deformity or atrophy Skin- no rash or lesion Psych- euthymic mood, full affect Neuro- strength and sensation are intact  EKG- Sinus  Rhythm at 70 bpm, Pr int 172 ms, qrs int 112 ms, qtc 460 ms(stable) Epic records reviewed    Assessment and Plan: 1. Paroxysmal afib/atach S/p second ablation 05/2017 Maintaining SR Continue apixaban  5 mg bid for a chadsvasc score  of at least 2 Continue carvedilol Continue dofetilide K+/mag today  3. OSA Compliance with cpap encouraged  3. Nonobstructive CAD Medical management has been advised in the past Previous LHC 9/17, with nonobstructive disease  4. Sick sinus Normal pacemaker function when last evaluated with Dr. Rayann Heman  F/u with Dr. Rayann Heman as per recall   Geroge Baseman. Mechelle Pates, [facility] [address] [place], [place] [address] [phone]

## 2017-11-19 ENCOUNTER — Encounter (HOSPITAL_COMMUNITY): Payer: Self-pay | Admitting: *Deleted

## 2017-12-06 ENCOUNTER — Other Ambulatory Visit: Payer: Self-pay | Admitting: Gastroenterology

## 2017-12-21 ENCOUNTER — Ambulatory Visit: Payer: Medicaid Other | Admitting: Gastroenterology

## 2017-12-27 ENCOUNTER — Telehealth: Payer: Self-pay

## 2017-12-27 ENCOUNTER — Encounter: Payer: Self-pay | Admitting: Gastroenterology

## 2017-12-27 ENCOUNTER — Ambulatory Visit: Payer: Medicaid Other | Admitting: Gastroenterology

## 2017-12-27 VITALS — BP 124/78 | HR 72 | Ht 71.0 in | Wt 282.0 lb

## 2017-12-27 DIAGNOSIS — K219 Gastro-esophageal reflux disease without esophagitis: Secondary | ICD-10-CM

## 2017-12-27 DIAGNOSIS — Z1211 Encounter for screening for malignant neoplasm of colon: Secondary | ICD-10-CM

## 2017-12-27 MED ORDER — SUPREP BOWEL PREP KIT 17.5-3.13-1.6 GM/177ML PO SOLN: ORAL | 0 refills | Status: DC

## 2017-12-27 NOTE — Telephone Encounter (Signed)
Routing to pharmacy.  Dicy Smigel C. Adaiah Jaskot, RN, ANP-C [facility] [address] [place], [place]  [address] [phone]  

## 2017-12-27 NOTE — Progress Notes (Signed)
HPI :  57 y/o male istory of AF, CAD, CHF, PE, OSA, pacemaker in place, on Eliquis, here for a follow up visit.   At our last visit we discussed his reflux symptoms, namely heartburn and regurgitation. He was having breakthrough on 40mg  omeprazole BID and had transitioned him to Dexilant 60mg  once daily. We had also recommended a follow up EGD at that time given he had prior esophagitis not followed up on prior endoscopy in 2009. This was not done due to issues with Atrial fibrillation and he is here to follow up today.   He states Dexilant has worked to take away his reflux symptoms, states works much better than omeprazole. He denies any breakthrough symptoms. No dysphagia. No abdominal pains. He is eating well.   He is concerned about his risks for colon cancer and esophageal cancer. He has been to "5 funerals recently" for multiple friends and family passing away from cancer, a few of them at a young age, one with colon cancer. He denies any first degree family history of colon cancer or esophageal cancer. His last colonoscopy was done in 2011 and he did not have polyps, but has concerns about waiting 10 years from the time of that exam for his next exam. He has no bowel changes. No blood in the stools. No weight loss.   He had an MI in 2009. On Eliquis for atrial fibrillation. He denies any cardiopulmonary symptoms at present and generally feeling well without complaints. Last echo in 05/2017 showing EF 35-40%.  Prior workup: Colonoscopy 01/22/2010 - hemorrhoids, otherwise normal EGD 11/02/2007 - distal esophagitis  TEE echo 05/05/2017 - EF 35-40%  Past Medical History:  Diagnosis Date  . Atrial fibrillation ([place])    s/p afib ablation  PVI 5/12 JA  . CAD -nonobstructive    S/P nstemi-type II cath 05/2008 - nonobs. dzs.  . DDD (degenerative disc disease)    evaluate by neurosurgery is ongoing  . Depression    ptsd  . Erectile dysfunction   . Headache(784.0)   . Hemorrhoids   . HTN  (hypertension)   . Hyperlipemia   . Hyperthyroidism    Graves dz on methimazole and followed by [name]  . Obesity   . Pancreatitis   . Polysubstance abuse ([place])    cocaine, last used 1999  . PUD (peptic ulcer disease)    gastritis due to ETOH previously  . Pulmonary embolism ([place]) 04/2003   Hx of  . Rectal bleeding   . Sinus node dysfunction ([facility])    s/p MDT PPM - AAI - V port plugged  . Sleep apnea    uses CPAP [facility] SLE[facility] 2 YRS  . Systolic and diastolic CHF, chronic ([place])    EF 35% by most recent echo 09/19/10  . 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 ANTERIOR CERVICAL DECOMP/DISCECTOMY FUSION  12/15/2011   Procedure: ANTERIOR CERVICAL DECOMPRESSION/DISCECTOMY FUSION 1 LEVEL;  Surgeon: [name], MD;  Location: [facility];  Service: Neurosurgery;  Laterality: N/A;  Cervical Six-Seven Anterior Cervical Diskectomy fusion with Allograft and Plating  . ATRIAL ABLATION SURGERY  12/2010   afib ablation by [name]  . ATRIAL FIBRILLATION ABLATION N/A 05/06/2017   Procedure: Atrial Fibrillation Ablation;  Surgeon: Thompson Grayer, MD;  Location: [facility];  Service: Cardiovascular;  Laterality: N/A;  . BACK SURGERY    .  CARDIAC CATHETERIZATION     CADIAC ABLATION [name] 12/2010  . CARDIAC CATHETERIZATION N/A 05/08/2016   Procedure: Left Heart Cath and Coronary Angiography;  Surgeon: Peter M Martinique, MD;  Location: [facility];  Service: Cardiovascular;  Laterality: N/A;  . Fingers removed from right hand.  2/84   traumatic work injury  . INSERT / REPLACE / REMOVE PACEMAKER     05/2008    . LUMBAR SPINE SURGERY  02/2009   Dr. Trenton Gammon  . PACEMAKER INSERTION  10/09   by Dr [name]  . TEE WITHOUT CARDIOVERSION N/A 05/05/2017   Procedure: TRANSESOPHAGEAL ECHOCARDIOGRAM (TEE);  Surgeon: Larey Dresser, MD;  Location: [facility] ENDOSCOPY;  Service: Cardiovascular;  Laterality: N/A;   Family History    Problem Relation Age of Onset  . Heart disease Mother   . Breast cancer Mother   . Lung cancer Father   . Diabetes Unknown   . Colon cancer Neg Hx    Social History   Tobacco Use  . Smoking status: Former Smoker    Packs/day: 1.00    Years: 15.00    Pack years: 15.00    Types: Cigarettes    Last attempt to quit: 04/04/2003    Years since quitting: 14.7  . Smokeless tobacco: Never Used  Substance Use Topics  . Alcohol use: No    Comment: formerly heavy ETOH,  now drinks a fifth of liquor every 2 weeks  . Drug use: No    Comment: previously used crack cocaine, quit 1999,  + marijuana   Current Outpatient Medications  Medication Sig Dispense Refill  . carvedilol (COREG) 25 MG tablet TAKE 1 TABLET (25 MG TOTAL) BY MOUTH 2 (TWO) TIMES DAILY WITH A MEAL. 180 tablet 3  . clobetasol cream (TEMOVATE) 2.70 % Apply 1 application topically 2 (two) times daily as needed (for rash).     [name] desonide (DESOWEN) 0.05 % cream Apply 1 application topically 2 (two) times daily as needed (for rash).     . DEXILANT 60 MG capsule TAKE 1 CAPSULE BY MOUTH EVERY DAY 30 capsule 0  . dofetilide (TIKOSYN) 500 MCG capsule TAKE 1 CAPSULE (500 MCG TOTAL) BY MOUTH 2 (TWO) TIMES DAILY. 180 capsule 3  . ELIQUIS 5 MG TABS tablet TAKE 1 TABLET (5 MG TOTAL) BY MOUTH 2 (TWO) TIMES DAILY. 60 tablet 9  . gabapentin (NEURONTIN) 100 MG capsule Take 100 mg by mouth 2 (two) times daily.     . hydrocortisone cream 0.5 % Apply 1 application topically 2 (two) times daily.    [name] ibuprofen (ADVIL,MOTRIN) 200 MG tablet Take 800 mg by mouth every 6 (six) hours as needed for headache (back pain).    . methimazole (TAPAZOLE) 10 MG tablet Take 1 tablet (10 mg total) by mouth daily. 30 tablet 1  . oxyCODONE-acetaminophen (PERCOCET) 10-325 MG per tablet Take 1 tablet by mouth every 4 (four) hours as needed for pain. 29 tablet 0  . triamcinolone ointment (KENALOG) 0.1 % Apply 1 application topically 2 (two) times daily as needed (for  rash).   3   No current facility-administered medications for this visit.    Allergies  Allergen Reactions  . Potassium Chloride Other (See Comments)    Dizziness and nausea      Review of Systems: All systems reviewed and negative except where noted in HPI.   Lab Results  Component Value Date   WBC 4.5 04/28/2017   HGB 14.9 04/28/2017   HCT 44.2 04/28/2017  MCV 81 04/28/2017   PLT 223 04/28/2017    Lab Results  Component Value Date   CREATININE 1.01 11/15/2017   BUN 11 11/15/2017   NA 139 11/15/2017   K 3.7 11/15/2017   CL 106 11/15/2017   CO2 25 11/15/2017    Lab Results  Component Value Date   ALT 23 04/04/2017   AST 26 04/04/2017   ALKPHOS 75 04/04/2017   BILITOT 0.9 04/04/2017     Physical Exam: BP 124/78   Pulse 72   Ht 5\' 11"  (1.803 m)   Wt 282 lb (127.9 kg)   BMI 39.33 kg/m  Constitutional: Pleasant,well-developed, male in no acute distress. HEENT: Normocephalic and atraumatic. Conjunctivae are normal. No scleral icterus. Neck supple.  Cardiovascular: Normal rate, regular rhythm.  Pulmonary/chest: Effort normal and breath sounds normal. No wheezing, rales or rhonchi. Abdominal: Soft, nondistended, nontender.  There are no masses palpable. No hepatomegaly. Extremities: no edema Lymphadenopathy: No cervical adenopathy noted. Neurological: Alert and oriented to person place and time. Skin: Skin is warm and dry. No rashes noted. Psychiatric: Normal mood and affect. Behavior is normal.   ASSESSMENT AND PLAN: 57 year old male with medical history as outlined above, here to discuss his history of reflux, and colon cancer screening, noted to be on Eliquis.  GERD - previously had poor control on omeprazole 40 mg twice daily. Now on Dexilant 60mg  once daily with a good response. He is not having any breakthrough symptoms or dysphagia. He had an EGD over 10 years ago showing esophagitis. Given his worsening symptoms and long-standing reflux with history  of esophagitis in the past, recommend EGD to follow-up and ensure appropriate healing of esophagitis and rule out Barrett esophagus. I discussed risks and benefits of endoscopy with him and he wanted to proceed. He wishes to continue his present dosing of Dexilant for now.   Colon cancer screening - he had his last colonoscopy about 8 years ago which did not show any polyps or precancerous changes. He is requesting a colonoscopy today given multiple friends and family he is noted to have passed from cancer in a short period of time. He is anxious about the possibility of colon cancer and knows that this is preventable with colonoscopy. I reassured him that his last colonoscopy should be protective against colon cancer, and he is not due for another 2 years; however, given his exposure to multiple friends passing from cancer, I offered him a colonoscopy for peace of mind to be done same time as upper endoscopy. He strongly wished to proceed with colonoscopy after discussion of risks and benefits. He will have to hold his Eliquis for 2 days prior to the colonoscopy to reduce the risk of bleeding. We will contact his prescribing provider to obtain approval.  Craig Cellar, MD [facility]

## 2017-12-27 NOTE — Patient Instructions (Addendum)
If you are age 59 or older, your body mass index should be between 23-30. Your Body mass index is 39.33 kg/m. If this is out of the aforementioned range listed, please consider follow up with your Primary Care Provider.  If you are age 25 or younger, your body mass index should be between 19-25. Your Body mass index is 39.33 kg/m. If this is out of the aformentioned range listed, please consider follow up with your Primary Care Provider.   You have been scheduled for an endoscopy and colonoscopy. Please follow the written instructions given to you at your visit today. Please pick up your prep supplies at the pharmacy within the next 1-3 days. If you use inhalers (even only as needed), please bring them with you on the day of your procedure. Your physician has requested that you go to www.startemmi.com and enter the access code given to you at your visit today. This web site gives a general overview about your procedure. However, you should still follow specific instructions given to you by our office regarding your preparation for the procedure.  You will be contacted by our office prior to your procedure for directions on holding your Eliquis.  If you do not hear from our office 1 week prior to your scheduled procedure, please call [phone] to discuss.  Please HOLD YOUR PHENTERMINE FOR 10 DAYS PRIOR TO YOUR PROCEDURE, starting on February 20, 2018.  Thank you for entrusting me with your care and for choosing [facility], Dr. [place] Cellar

## 2017-12-27 NOTE — Telephone Encounter (Signed)
[facility] Pre-operative Risk Assessment     Request for surgical clearance:     Endoscopy Procedure  What type of surgery is being performed?     Endoscopy , colonoscopy  When is this surgery scheduled?     03-02-18  What type of clearance is required ?   Pharmacy  Are there any medications that need to be held prior to surgery and how long? Eliquis  Practice name and name of physician performing surgery?      [facility]  What is your office phone and fax number?      Phone- [phone]  Fax252 [phone]  Anesthesia type (None, local, MAC, general) ?       MAC

## 2017-12-27 NOTE — Telephone Encounter (Signed)
Patient with diagnosis of Afib with history of PE on Eliquis for anticoagulation.    Procedure: Endoscopy Date of procedure: 03-02-18  CHADS2-VASc score of  3 (CHF, HTN, AGE, DM2, stroke/tia x 2, CAD, AGE, male)  CrCl 121ml/min  Per office protocol, patient can hold Eliquis for 24 hours prior to procedure.

## 2017-12-27 NOTE — Telephone Encounter (Signed)
Called and spoke to pt. Instructed him to hold Eliquis on July 1st and 2nd. Procedure on July 3rd.  He expressed understanding.

## 2018-02-02 ENCOUNTER — Other Ambulatory Visit: Payer: Self-pay | Admitting: Gastroenterology

## 2018-02-07 ENCOUNTER — Encounter: Payer: Medicaid Other | Admitting: *Deleted

## 2018-02-09 ENCOUNTER — Encounter: Payer: Self-pay | Admitting: Cardiology

## 2018-02-15 ENCOUNTER — Ambulatory Visit: Payer: Medicaid Other | Admitting: Neurology

## 2018-02-15 ENCOUNTER — Encounter: Payer: Self-pay | Admitting: Neurology

## 2018-02-15 ENCOUNTER — Ambulatory Visit (INDEPENDENT_AMBULATORY_CARE_PROVIDER_SITE_OTHER): Payer: Medicaid Other | Admitting: *Deleted

## 2018-02-15 DIAGNOSIS — I495 Sick sinus syndrome: Secondary | ICD-10-CM

## 2018-02-16 ENCOUNTER — Encounter: Payer: Self-pay | Admitting: Cardiology

## 2018-02-16 NOTE — Progress Notes (Signed)
Remote pacemaker transmission.   

## 2018-02-18 LAB — CUP PACEART REMOTE DEVICE CHECK
Battery Remaining Longevity: 101 mo
Implantable Lead Model: 5076
Implantable Pulse Generator Implant Date: 20091023
Lead Channel Impedance Value: 434 Ohm
Lead Channel Setting Pacing Amplitude: 2 V
MDC IDC LEAD IMPLANT DT: 20091023
MDC IDC LEAD LOCATION: 753859
MDC IDC MSMT BATTERY IMPEDANCE: 724 Ohm
MDC IDC MSMT BATTERY VOLTAGE: 2.78 V
MDC IDC MSMT LEADCHNL RV IMPEDANCE VALUE: 67 Ohm
MDC IDC SESS DTM: 20190618202108
MDC IDC STAT BRADY RA PERCENT PACED: 3 %

## 2018-03-01 ENCOUNTER — Telehealth: Payer: Self-pay | Admitting: *Deleted

## 2018-03-01 NOTE — Telephone Encounter (Signed)
Patient was being called for appointment reminder but was concerned about stopping his blood thinners. He's on Eliquis and was told to stop it on July 1 for procedure on July 3. Patient reassured that he had followed directions. SM

## 2018-03-02 ENCOUNTER — Ambulatory Visit (AMBULATORY_SURGERY_CENTER): Payer: Medicaid Other | Admitting: Gastroenterology

## 2018-03-02 ENCOUNTER — Other Ambulatory Visit: Payer: Self-pay

## 2018-03-02 ENCOUNTER — Encounter: Payer: Self-pay | Admitting: Gastroenterology

## 2018-03-02 ENCOUNTER — Telehealth: Payer: Self-pay | Admitting: Gastroenterology

## 2018-03-02 VITALS — BP 166/105 | HR 61 | Temp 98.7°F | Resp 17 | Ht 71.0 in | Wt 282.0 lb

## 2018-03-02 DIAGNOSIS — K219 Gastro-esophageal reflux disease without esophagitis: Secondary | ICD-10-CM | POA: Diagnosis not present

## 2018-03-02 DIAGNOSIS — K317 Polyp of stomach and duodenum: Secondary | ICD-10-CM | POA: Diagnosis not present

## 2018-03-02 DIAGNOSIS — D123 Benign neoplasm of transverse colon: Secondary | ICD-10-CM

## 2018-03-02 DIAGNOSIS — D122 Benign neoplasm of ascending colon: Secondary | ICD-10-CM

## 2018-03-02 DIAGNOSIS — D132 Benign neoplasm of duodenum: Secondary | ICD-10-CM

## 2018-03-02 DIAGNOSIS — Z1211 Encounter for screening for malignant neoplasm of colon: Secondary | ICD-10-CM

## 2018-03-02 DIAGNOSIS — K2980 Duodenitis without bleeding: Secondary | ICD-10-CM

## 2018-03-02 DIAGNOSIS — D127 Benign neoplasm of rectosigmoid junction: Secondary | ICD-10-CM

## 2018-03-02 MED ORDER — SODIUM CHLORIDE 0.9 % IV SOLN: 500.0000 mL | Freq: Once | INTRAVENOUS | Status: DC

## 2018-03-02 NOTE — Progress Notes (Signed)
Report to PACU, RN, vss, BBS= Clear.  

## 2018-03-02 NOTE — Telephone Encounter (Signed)
Patient called asking about card given documenting endo clip. Explained what it was for and that the clip would leave by bowel movement in two - four weeks. Alert for MRI. Patient states he cannot have an MRI anyway.

## 2018-03-02 NOTE — Progress Notes (Signed)
Called to room to assist during endoscopic procedure.  Patient ID and intended procedure confirmed with present staff. Received instructions for my participation in the procedure from the performing physician.  

## 2018-03-02 NOTE — Patient Instructions (Signed)
RESUME ELIQUIS IN THREE DAYS ON SATRUDAY. CAN TAKE 81 MG BABY ASPIRIN WHILE OFF ELIQUIS AVOID ALL OTHER NSAIDS LIKE IBUPROFEN, ALEVE, MOTRIN.   YOU HAD AN ENDOSCOPIC PROCEDURE TODAY AT [facility]:   Refer to the procedure report that was given to you for any specific questions about what was found during the examination.  If the procedure report does not answer your questions, please call your gastroenterologist to clarify.  If you requested that your care partner not be given the details of your procedure findings, then the procedure report has been included in a sealed envelope for you to review at your convenience later.  YOU SHOULD EXPECT: Some feelings of bloating in the abdomen. Passage of more gas than usual.  Walking can help get rid of the air that was put into your GI tract during the procedure and reduce the bloating. If you had a lower endoscopy (such as a colonoscopy or flexible sigmoidoscopy) you may notice spotting of blood in your stool or on the toilet paper. If you underwent a bowel prep for your procedure, you may not have a normal bowel movement for a few days.  Please Note:  You might notice some irritation and congestion in your nose or some drainage.  This is from the oxygen used during your procedure.  There is no need for concern and it should clear up in a day or so.  SYMPTOMS TO REPORT IMMEDIATELY:   Following lower endoscopy (colonoscopy or flexible sigmoidoscopy):  Excessive amounts of blood in the stool  Significant tenderness or worsening of abdominal pains  Swelling of the abdomen that is new, acute  Fever of 100F or higher   Following upper endoscopy (EGD)  Vomiting of blood or coffee ground material  New chest pain or pain under the shoulder blades  Painful or persistently difficult swallowing  New shortness of breath  Fever of 100F or higher  Black, tarry-looking stools  For urgent or emergent issues, a gastroenterologist can be  reached at any hour by calling [phone].   DIET:  We do recommend a small meal at first, but then you may proceed to your regular diet.  Drink plenty of fluids but you should avoid alcoholic beverages for 24 hours.  ACTIVITY:  You should plan to take it easy for the rest of today and you should NOT DRIVE or use heavy machinery until tomorrow (because of the sedation medicines used during the test).    FOLLOW UP: Our staff will call the number listed on your records the next business day following your procedure to check on you and address any questions or concerns that you may have regarding the information given to you following your procedure. If we do not reach you, we will leave a message.  However, if you are feeling well and you are not experiencing any problems, there is no need to return our call.  We will assume that you have returned to your regular daily activities without incident.  If any biopsies were taken you will be contacted by phone or by letter within the next 1-3 weeks.  Please call us at [phone] if you have not heard about the biopsies in 3 weeks.    SIGNATURES/CONFIDENTIALITY: You and/or your care partner have signed paperwork which will be entered into your electronic medical record.  These signatures attest to the fact that that the information above on your After Visit Summary has been reviewed and is understood.  Full  responsibility of the confidentiality of this discharge information lies with you and/or your care-partner.YOU HAD AN ENDOSCOPIC PROCEDURE TODAY AT [facility]:   Refer to the procedure report that was given to you for any specific questions about what was found during the examination.  If the procedure report does not answer your questions, please call your gastroenterologist to clarify.  If you requested that your care partner not be given the details of your procedure findings, then the procedure report has been included in a  sealed envelope for you to review at your convenience later.  YOU SHOULD EXPECT: Some feelings of bloating in the abdomen. Passage of more gas than usual.  Walking can help get rid of the air that was put into your GI tract during the procedure and reduce the bloating. If you had a lower endoscopy (such as a colonoscopy or flexible sigmoidoscopy) you may notice spotting of blood in your stool or on the toilet paper. If you underwent a bowel prep for your procedure, you may not have a normal bowel movement for a few days.  Please Note:  You might notice some irritation and congestion in your nose or some drainage.  This is from the oxygen used during your procedure.  There is no need for concern and it should clear up in a day or so.  SYMPTOMS TO REPORT IMMEDIATELY:   Following lower endoscopy (colonoscopy or flexible sigmoidoscopy):  Excessive amounts of blood in the stool  Significant tenderness or worsening of abdominal pains  Swelling of the abdomen that is new, acute  Fever of 100F or higher   Following upper endoscopy (EGD)  Vomiting of blood or coffee ground material  New chest pain or pain under the shoulder blades  Painful or persistently difficult swallowing  New shortness of breath  Fever of 100F or higher  Black, tarry-looking stools  For urgent or emergent issues, a gastroenterologist can be reached at any hour by calling [phone].   DIET:  We do recommend a small meal at first, but then you may proceed to your regular diet.  Drink plenty of fluids but you should avoid alcoholic beverages for 24 hours.  ACTIVITY:  You should plan to take it easy for the rest of today and you should NOT DRIVE or use heavy machinery until tomorrow (because of the sedation medicines used during the test).    FOLLOW UP: Our staff will call the number listed on your records the next business day following your procedure to check on you and address any questions or concerns that you may  have regarding the information given to you following your procedure. If we do not reach you, we will leave a message.  However, if you are feeling well and you are not experiencing any problems, there is no need to return our call.  We will assume that you have returned to your regular daily activities without incident.  If any biopsies were taken you will be contacted by phone or by letter within the next 1-3 weeks.  Please call us at [phone] if you have not heard about the biopsies in 3 weeks.    SIGNATURES/CONFIDENTIALITY: You and/or your care partner have signed paperwork which will be entered into your electronic medical record.  These signatures attest to the fact that that the information above on your After Visit Summary has been reviewed and is understood.  Full responsibility of the confidentiality of this discharge information lies with you and/or  your care-partner. 

## 2018-03-02 NOTE — Op Note (Signed)
[place] Patient Name: [name] Procedure Date: 03/02/2018 8:43 AM MRN: [id number] Endoscopist: Remo Lipps P. Havery Moros , MD Age: 57 Referring MD:  Date of Birth: Jan 28, 1961 Gender: Male Account #: [id number] Procedure:                Colonoscopy Indications:              Screening for colorectal malignant neoplasm Medicines:                Monitored Anesthesia Care Procedure:                Pre-Anesthesia Assessment:                           - Prior to the procedure, a History and Physical                            was performed, and patient medications and                            allergies were reviewed. The patient's tolerance of                            previous anesthesia was also reviewed. The risks                            and benefits of the procedure and the sedation                            options and risks were discussed with the patient.                            All questions were answered, and informed consent                            was obtained. Prior Anticoagulants: The patient has                            taken Eliquis (apixaban), last dose was 2 days                            prior to procedure. ASA Grade Assessment: III - A                            patient with severe systemic disease. After                            reviewing the risks and benefits, the patient was                            deemed in satisfactory condition to undergo the                            procedure.  After obtaining informed consent, the colonoscope                            was passed under direct vision. Throughout the                            procedure, the patient's blood pressure, pulse, and                            oxygen saturations were monitored continuously. The                            Colonoscope was introduced through the anus and                            advanced to the the cecum, identified by                        appendiceal orifice and ileocecal valve. The                            colonoscopy was performed without difficulty. The                            patient tolerated the procedure well. The quality                            of the bowel preparation was good. The ileocecal                            valve, appendiceal orifice, and rectum were                            photographed. Scope In: 9:19:08 AM Scope Out: 9:42:15 AM Scope Withdrawal Time: 0 hours 18 minutes 1 second  Total Procedure Duration: 0 hours 23 minutes 7 seconds  Findings:                 The perianal and digital rectal examinations were                            normal.                           The colon was redundant with significant looping.                           A 12 mm polyp was found in the ascending colon. The                            polyp was sessile. It was wrapped around a fold and                            technically challenging to remove given poor  ability to maintain good position due to looping                            The polyp was removed with a cold snare. Resection                            and retrieval were complete.                           A 4 mm polyp was found in the transverse colon. The                            polyp was sessile. The polyp was removed with a                            cold snare. Resection and retrieval were complete.                           A 3 mm polyp was found in the recto-sigmoid colon.                            The polyp was sessile. The polyp was removed with a                            cold snare. Resection and retrieval were complete.                           Internal hemorrhoids were found during retroflexion.                           The exam was otherwise without abnormality. Complications:            No immediate complications. Estimated blood loss:                            Minimal. Estimated  Blood Loss:     Estimated blood loss was minimal. Impression:               - Redundant colon with significant looping.                           - One 12 mm polyp in the ascending colon, removed                            with a cold snare. Resected and retrieved.                           - One 4 mm polyp in the transverse colon, removed                            with a cold snare. Resected and retrieved.                           -  One 3 mm polyp at the recto-sigmoid colon,                            removed with a cold snare. Resected and retrieved.                           - Internal hemorrhoids.                           - The examination was otherwise normal. Recommendation:           - Patient has a contact number available for                            emergencies. The signs and symptoms of potential                            delayed complications were discussed with the                            patient. Return to normal activities tomorrow.                            Written discharge instructions were provided to the                            patient.                           - Resume previous diet.                           - Continue present medications.                           - Resume Eliquis in 3 days per EGD note                           - Await pathology results.                           - Repeat colonoscopy for surveillance based on                            pathology results. Remo Lipps P. Aryahna Spagna, MD 03/02/2018 9:48:57 AM This report has been signed electronically.

## 2018-03-02 NOTE — Op Note (Signed)
[place] Patient Name: [name] Procedure Date: 03/02/2018 8:43 AM MRN: [id number] Endoscopist: Remo Lipps P. Havery Moros , MD Age: 57 Referring MD:  Date of Birth: 12-12-1960 Gender: Male Account #: [id number] Procedure:                Upper GI endoscopy Indications:              Screening for Barrett's esophagus, history of                            reflux and remote history of esophagitis, now on                            Dexilant with better control of symptoms Medicines:                Monitored Anesthesia Care Procedure:                Pre-Anesthesia Assessment:                           - Prior to the procedure, a History and Physical                            was performed, and patient medications and                            allergies were reviewed. The patient's tolerance of                            previous anesthesia was also reviewed. The risks                            and benefits of the procedure and the sedation                            options and risks were discussed with the patient.                            All questions were answered, and informed consent                            was obtained. Prior Anticoagulants: The patient has                            taken Eliquis (apixaban), last dose was 2 days                            prior to procedure. ASA Grade Assessment: III - A                            patient with severe systemic disease. After                            reviewing the risks and benefits, the patient was  deemed in satisfactory condition to undergo the                            procedure.                           After obtaining informed consent, the endoscope was                            passed under direct vision. Throughout the                            procedure, the patient's blood pressure, pulse, and                            oxygen saturations were monitored continuously.  The                            Model GIF-HQ190 [id number]) scope was introduced                            through the mouth, and advanced to the second part                            of duodenum. The upper GI endoscopy was                            accomplished without difficulty. The patient                            tolerated the procedure well. Scope In: Scope Out: Findings:                 Esophagogastric landmarks were identified: the                            Z-line was found at 40 cm, the gastroesophageal                            junction was found at 40 cm and the upper extent of                            the gastric folds was found at 42 cm from the                            incisors.                           A 2 cm hiatal hernia was present.                           The exam of the esophagus was otherwise normal. No                            Barrett's  A single 4 mm sessile polyp was found in the                            gastric antrum. Biopsies were taken with a cold                            forceps for histology.                           The exam of the stomach was otherwise normal.                           A single 4 mm sessile polyp was found in the second                            portion of the duodenum in the sweep at angulated                            turn and quite difficult to visualize initially.                            The polyp was removed with a cold biopsy forceps                            given its location. Resection and retrieval were                            thought to have been complete.                           A single 7 mm sessile polyp was found in the third                            portion of the duodenum. The polyp was removed with                            a hot snare. Resection and retrieval were complete.                            Some oozing occurred post polypectomy. To prevent                             bleeding after the polypectomy in light of the                            patient's Eliquis use, two hemostatic clips were                            successfully placed. (one other clip misfired, and                            another clip that was used was defective)  The exam of the duodenum was otherwise normal. Complications:            No immediate complications. Estimated blood loss:                            Minimal. Estimated Blood Loss:     Estimated blood loss was minimal. Impression:               - Esophagogastric landmarks identified.                           - 2 cm hiatal hernia.                           - Normal esophagus - No Barrett's                           - A single gastric polyp. Biopsied.                           - A single duodenal polyp in the sweep. Resected                            and retrieved with forceps.                           - A single duodenal polyp in the third portion of                            the duodenum. Resected and retrieved. 2 clips were                            placed. Recommendation:           - Patient has a contact number available for                            emergencies. The signs and symptoms of potential                            delayed complications were discussed with the                            patient. Return to normal activities tomorrow.                            Written discharge instructions were provided to the                            patient.                           - Resume previous diet.                           - Continue present medications.                           -  Resume Eliquis in 3 days due to small bowel polyp                            removed                           - Can take baby aspirin 81mg  while off Eliquis                           - Avoid all other NSAIDs                           - Await pathology results. Remo Lipps P. Rolla Kedzierski,  MD 03/02/2018 9:56:46 AM This report has been signed electronically.

## 2018-03-07 ENCOUNTER — Telehealth: Payer: Self-pay | Admitting: *Deleted

## 2018-03-07 ENCOUNTER — Telehealth: Payer: Self-pay

## 2018-03-07 NOTE — Telephone Encounter (Signed)
  Follow up Call-  Call back number 03/02/2018 08/05/2017 11/04/2015  Post procedure Call Back phone  # [phone]  Permission to leave phone message Yes - -  Some recent data might be hidden     Patient questions:  Do you have a fever, pain , or abdominal swelling? No. Pain Score  0 *  Have you tolerated food without any problems? Yes.    Have you been able to return to your normal activities? Yes.    Do you have any questions about your discharge instructions: Diet   No. Medications  No. Follow up visit  No.  Do you have questions or concerns about your Care? No.  Actions: * If pain score is 4 or above: No action needed, pain <4.

## 2018-03-07 NOTE — Telephone Encounter (Signed)
  Follow up Call-  Call back number 03/02/2018 08/05/2017 11/04/2015  Post procedure Call Back phone  # [phone]  Permission to leave phone message Yes - -  Some recent data might be hidden    [facility]

## 2018-03-10 ENCOUNTER — Encounter: Payer: Self-pay | Admitting: Gastroenterology

## 2018-04-21 ENCOUNTER — Telehealth: Payer: Self-pay | Admitting: *Deleted

## 2018-04-21 ENCOUNTER — Ambulatory Visit: Payer: Medicaid Other | Admitting: Neurology

## 2018-04-21 NOTE — Telephone Encounter (Signed)
Duplicate note opened in error  

## 2018-04-21 NOTE — Telephone Encounter (Signed)
Called at 10:30am to cancel 11am new patient appt.  Stated he was still at the [place] and unable to make his new patient appt in time.

## 2018-04-22 ENCOUNTER — Telehealth: Payer: Self-pay | Admitting: Neurology

## 2018-04-22 NOTE — Telephone Encounter (Signed)
FYI- Patient has had 2 New Patient no shows at [facility] in 2019.

## 2018-04-27 ENCOUNTER — Encounter: Payer: Self-pay | Admitting: Neurology

## 2018-04-27 ENCOUNTER — Telehealth: Payer: Self-pay | Admitting: Gastroenterology

## 2018-04-27 NOTE — Telephone Encounter (Signed)
Advised that it appears a prior authorization request was faxed to [facility] on 04/25/18 at 9:04 am by Tia Alert, [place] for [place]. It typically takes 72 business hours for [name] to get an approval or denial on medication. I will contact them today to see where we are in the process and will contact patient to let him know. Patient verbalizes understanding.

## 2018-04-27 NOTE — Telephone Encounter (Signed)
Per [employer] (phone [phone] spoke to [place]), Dexilant has been approved until 10/22/18. PA# [id number].   Patient has tried and failed omeprazole 40 mg twice daily with dysphagia and breakthrough reflux in the past and is well controlled on Dexilant 60 mg currently.  I have contacted pharmacy who states prescription has gone through at $3.00 for patient. Patient has been advised of this.

## 2018-05-10 ENCOUNTER — Other Ambulatory Visit: Payer: Self-pay | Admitting: Neurosurgery

## 2018-05-10 DIAGNOSIS — M5416 Radiculopathy, lumbar region: Secondary | ICD-10-CM

## 2018-05-18 ENCOUNTER — Telehealth: Payer: Self-pay | Admitting: Cardiology

## 2018-05-18 ENCOUNTER — Ambulatory Visit: Payer: Medicaid Other | Admitting: *Deleted

## 2018-05-18 NOTE — Telephone Encounter (Signed)
Spoke with pt and reminded pt of remote transmission that is due today. Pt verbalized understanding.   

## 2018-05-19 ENCOUNTER — Encounter: Payer: Self-pay | Admitting: Cardiology

## 2018-05-20 ENCOUNTER — Telehealth: Payer: Self-pay | Admitting: Physician Assistant

## 2018-05-20 ENCOUNTER — Ambulatory Visit (INDEPENDENT_AMBULATORY_CARE_PROVIDER_SITE_OTHER): Payer: Medicaid Other | Admitting: *Deleted

## 2018-05-20 ENCOUNTER — Other Ambulatory Visit: Payer: Self-pay | Admitting: Internal Medicine

## 2018-05-20 DIAGNOSIS — I495 Sick sinus syndrome: Secondary | ICD-10-CM

## 2018-05-20 MED ORDER — DOFETILIDE 500 MCG PO CAPS: 500.0000 ug | ORAL_CAPSULE | Freq: Two times a day (BID) | ORAL | 0 refills | Status: DC

## 2018-05-20 NOTE — Telephone Encounter (Signed)
New message:       *STAT* If patient is at the pharmacy, call can be transferred to refill team.   1. Which medications need to be refilled? (please list name of each medication and dose if known) dofetilide (TIKOSYN) 500 MCG capsule  2. Which pharmacy/location (including street and city if local pharmacy) is medication to be sent to?[id number] [place] [place] [address]  3. Do they need a 30 day or 90 day supply? [place]

## 2018-05-20 NOTE — Telephone Encounter (Signed)
Paged by answering service.  Patient is unable to refill his 90 days supply of Tikosyn. Run out today and pharmacy said he cannot refill until Monday.  I have asked if he took any extra medications than prescribed or there was less medication in a bottle.  He got really agitated over the phone and said " please have someone call me and I don't know your answer" and then he hang up before I can ask name of pharmacy.   In Epic, [facility]  [address] [address]  Store number: 54   Spoke with Pharmacy staff who said Mr. Loy is not filing up his [facility]yn [facility]. I called patient again and  he said he is getting Tikosyn from [place]. He is not at home to look information from bottle.   Spoke with staff. Last filled date 03/05/18 - 90 days supplies. Pharmacist will call insurance company and see if they fill Rx 2 days sooner.   I have updated Mr. Clabe Seal who will contact pharmacy for refills.

## 2018-05-20 NOTE — Telephone Encounter (Signed)
Pt's medication was sent to pt's pharmacy as requested. Confirmation received.  °

## 2018-05-23 ENCOUNTER — Ambulatory Visit
Admission: RE | Admit: 2018-05-23 | Discharge: 2018-05-23 | Disposition: A | Discharge status: Home / Self Care | Payer: Medicaid Other | Source: Ambulatory Visit | Attending: Neurosurgery | Admitting: Neurosurgery

## 2018-05-23 DIAGNOSIS — M5416 Radiculopathy, lumbar region: Secondary | ICD-10-CM

## 2018-05-24 ENCOUNTER — Encounter: Payer: Self-pay | Admitting: Cardiology

## 2018-05-24 NOTE — Progress Notes (Signed)
Remote pacemaker transmission.   

## 2018-05-30 ENCOUNTER — Telehealth: Payer: Self-pay | Admitting: Gastroenterology

## 2018-05-30 NOTE — Telephone Encounter (Signed)
Patient states he needs medication [facility]

## 2018-05-31 MED ORDER — DEXLANSOPRAZOLE 60 MG PO CPDR: 60.0000 mg | DELAYED_RELEASE_CAPSULE | Freq: Every day | ORAL | 2 refills | Status: DC

## 2018-05-31 NOTE — Telephone Encounter (Signed)
Spoke to pt. [facility] [address] [facility] Script sent

## 2018-06-01 LAB — CUP PACEART REMOTE DEVICE CHECK
Battery Voltage: 2.78 V
Brady Statistic RA Percent Paced: 3 %
Implantable Lead Implant Date: 20091023
Implantable Lead Location: 753859
Implantable Pulse Generator Implant Date: 20091023
Lead Channel Impedance Value: 449 Ohm
Lead Channel Impedance Value: 67 Ohm
Lead Channel Setting Pacing Amplitude: 2 V
MDC IDC MSMT BATTERY IMPEDANCE: 749 Ohm
MDC IDC MSMT BATTERY REMAINING LONGEVITY: 99 mo
MDC IDC SESS DTM: 20190921005203

## 2018-07-19 ENCOUNTER — Other Ambulatory Visit: Payer: Self-pay

## 2018-07-19 ENCOUNTER — Telehealth: Payer: Self-pay | Admitting: Gastroenterology

## 2018-07-19 MED ORDER — DEXLANSOPRAZOLE 60 MG PO CPDR: 60.0000 mg | DELAYED_RELEASE_CAPSULE | Freq: Every day | ORAL | 1 refills | Status: DC

## 2018-07-19 NOTE — Telephone Encounter (Signed)
refill sent for Dexilant. Pt had EGD in 02-2018, "continue present medications".

## 2018-08-26 ENCOUNTER — Encounter: Payer: Self-pay | Admitting: Cardiology

## 2018-08-29 ENCOUNTER — Ambulatory Visit (INDEPENDENT_AMBULATORY_CARE_PROVIDER_SITE_OTHER): Payer: Medicaid Other

## 2018-08-29 DIAGNOSIS — I480 Paroxysmal atrial fibrillation: Secondary | ICD-10-CM | POA: Diagnosis not present

## 2018-08-30 LAB — CUP PACEART REMOTE DEVICE CHECK
Battery Impedance: 823 Ohm
Battery Voltage: 2.78 V
Brady Statistic RA Percent Paced: 3 %
Date Time Interrogation Session: 20191230144241
Implantable Lead Location: 753859
Implantable Lead Model: 5076
Lead Channel Setting Pacing Amplitude: 2 V
MDC IDC LEAD IMPLANT DT: 20091023
MDC IDC MSMT BATTERY REMAINING LONGEVITY: 94 mo
MDC IDC MSMT LEADCHNL RA IMPEDANCE VALUE: 457 Ohm
MDC IDC MSMT LEADCHNL RV IMPEDANCE VALUE: 67 Ohm
MDC IDC PG IMPLANT DT: 20091023

## 2018-08-30 NOTE — Progress Notes (Signed)
Remote pacemaker transmission.   

## 2018-09-12 ENCOUNTER — Encounter: Payer: Self-pay | Admitting: Internal Medicine

## 2018-09-12 ENCOUNTER — Ambulatory Visit (INDEPENDENT_AMBULATORY_CARE_PROVIDER_SITE_OTHER): Payer: Medicaid Other | Admitting: Internal Medicine

## 2018-09-12 VITALS — BP 140/86 | HR 73 | Ht 71.0 in | Wt 282.8 lb

## 2018-09-12 DIAGNOSIS — J0100 Acute maxillary sinusitis, unspecified: Secondary | ICD-10-CM | POA: Diagnosis not present

## 2018-09-12 DIAGNOSIS — G4733 Obstructive sleep apnea (adult) (pediatric): Secondary | ICD-10-CM

## 2018-09-12 MED ORDER — METHYLPREDNISOLONE ACETATE 80 MG/ML IJ SUSP
80.0000 mg | Freq: Once | INTRAMUSCULAR | Status: AC
  Administered 2018-09-12: 80 mg via INTRAMUSCULAR

## 2018-09-12 MED ORDER — PHENYLEPHRINE HCL 1 % NA SOLN
3.0000 [drp] | Freq: Once | NASAL | Status: AC
  Administered 2018-09-12: 3 [drp] via NASAL

## 2018-09-12 MED ORDER — AMOXICILLIN 500 MG PO TABS: 500.0000 mg | ORAL_TABLET | Freq: Two times a day (BID) | ORAL | 0 refills | Status: DC

## 2018-09-12 NOTE — Assessment & Plan Note (Signed)
He continues to benefit from CPAP with improved sleep quality.  Download confirms good compliance and control. Plan-continue CPAP auto 5-15

## 2018-09-12 NOTE — Progress Notes (Signed)
HPI   male former smoker followed for OSA, complicated by CAD/CHF/P A. fib/pacemaker/Pradaxa, allergic rhinitis, hyperthyroid,  degenerative disc disease, GERD NPSG 2010- AHI 21/ hr  ---------------------------------------------------------------------------------------  11/12/2015-58 year old male former smoker followed for OSA, complicated by CAD/CHF/P A. fib/pacemaker/Pradaxa, allergic rhinitis, hyperthyroid CPAP auto 10-20/[facility]       New machine after last visit FOLLOWS FOR: DME: [facility]. Pt states he wears CPAP nightly. No new supplies needed at this time. DL attached. Very well with CPAP auto 10-20. Likes new machine. Complains of dry mouth but watery sniffing. Stuffy nose more than drainage. Complains of localized back pain one or 2 inches lateral to his spine right suprascapular area, increased by deep breath twisting, reaching. Being evaluated by Dr. Annette Stable for degenerative disc disease  09/11/2018- 58 year old male former smoker followed for OSA, complicated by CAD/CHF/P A. fib/pacemaker/Pradaxa, allergic rhinitis, hyperthyroid, degenerative disc disease, GERD CPAP auto 5-15/[facility]  -----OSA: DME [facility]. Pt wears CPAP nightly and DL attached. Questions sinus infection today. Download 93% compliance AHI 1.1/hour He admits he is not cleaning CPAP machine consistently and we discussed [facility] [employer] machines. Head cold 2 months ago with lingering nasal congestion occipital and maxillary headaches, intermittent discolored nasal discharge.  Chest feels clear.  Denies fever or blood.  ROS-see HPI   + = positive Constitutional:    weight loss, night sweats, fevers, chills, fatigue, lassitude. HEENT:    headaches, difficulty swallowing, tooth/dental problems, sore throat,       sneezing, itching, ear ache, +nasal congestion, post nasal drip, snoring CV:    chest pain, orthopnea, PND, swelling in lower extremities, anasarca,                                                    dizziness,  palpitations Resp:   shortness of breath with exertion or at rest.                productive cough,   non-productive cough, coughing up of blood.              change in color of mucus.  wheezing.   Skin:    rash or lesions. GI:  No-   heartburn, indigestion, abdominal pain, nausea, vomiting GU: . MS:   joint pain, stiffness,  Neuro-     nothing unusual Psych:  change in mood or affect.  depression or anxiety.   memory loss.  OBJ- Physical Exam General- Alert, Oriented, Affect-appropriate, Distress- none acute, + overweight Skin- rash-none, lesions- none, excoriation- none Lymphadenopathy- none Head- atraumatic            Eyes- Gross vision intact, PERRLA, conjunctivae and secretions clear            Ears- Hearing, canals-normal            Nose- , + turbinate edema, no-Septal dev, mucus, polyps, erosion, perforation             Throat- Mallampati IV , mucosa clear/not dry , drainage- none, tonsils- atrophic Neck- flexible , trachea midline, no stridor , thyroid nl, carotid no bruit Chest - symmetrical excursion , unlabored           Heart/CV- RRR , no murmur , no gallop  , no rub, nl s1 s2                           -  JVD- none , edema- none, stasis changes- none, varices- none           Lung- clear to P&A, wheeze- none, cough- none , dullness-none, rub- none           Chest wall- L pacemaker Abd-  Br/ Gen/ Rectal- Not done, not indicated Extrem- cyanosis- none, clubbing, none, atrophy- none, strength- nl. +Missing distal fingers R hand. Neuro- grossly intact to observation

## 2018-09-12 NOTE — Patient Instructions (Signed)
Script sent for amoxacillin antibiotic  Order- DME [facility]- please replace mask of choice, and hoses. Continue CPAP auto 5-15, mask of choice, humidifier, supplies, AirView or download card  Order- neb neo nasal    Dx acute maxillary sinusitis              Depo 80  Please call if we can help

## 2018-09-12 NOTE — Assessment & Plan Note (Signed)
He he is describing what sounds like lingering sinusitis. Plan-nasal nebulizer decongestant treatment, Depo-Medrol, amoxicillin

## 2018-09-27 ENCOUNTER — Telehealth: Payer: Self-pay | Admitting: Internal Medicine

## 2018-09-27 NOTE — Telephone Encounter (Signed)
Spoke with Bethanne Ginger who advises that pt is not available for a new CPAP mask until 10/15/18 & they will contact the pt when it is time.  Bethanne Ginger stated she will call the pt & let him know.  I will call the pt a little later to check in with him.

## 2018-09-27 NOTE — Telephone Encounter (Signed)
This order was sent out on 09/12/18 & was received per Estill Bamberg w/ [facility].  I called & spoke to Lattie Haw w/ [facility] who is placing a message for Estill Bamberg to call me.  LM on pt's VM to let him know I will call with an update once I hear something from [place].

## 2018-09-28 NOTE — Telephone Encounter (Signed)
LMTCB x1 for pt.  

## 2018-09-28 NOTE — Telephone Encounter (Signed)
LM on pot's VM letting him know what Gilda from [place] gold me about the CPAP maks & to call me with any questions.

## 2018-10-03 ENCOUNTER — Other Ambulatory Visit: Payer: Self-pay | Admitting: Internal Medicine

## 2018-10-07 ENCOUNTER — Telehealth: Payer: Self-pay | Admitting: *Deleted

## 2018-10-07 NOTE — Telephone Encounter (Signed)
   [facility] Pre-operative Risk Assessment    Request for surgical clearance:  1. What type of surgery is being performed? L3-4 TFESI INJECTION   2. When is this surgery scheduled?  10/20/2018   3. What type of clearance is required (medical clearance vs. Pharmacy clearance to hold med vs. Both)?  PHARMACY  4. Are there any medications that need to be held prior to surgery and how long? ELIQUIS X'S 3 DAYS   5. Practice name and name of physician performing surgery?  [facility] DR. HARKINS   6. What is your office phone number [phone]    7.   What is your office fax number [phone]  8.   Anesthesia type (None, local, MAC, general) ?  N/A   [name] 10/07/2018, 10:00 AM  _________________________________________________________________   (provider comments below)

## 2018-10-07 NOTE — Telephone Encounter (Signed)
Patient with diagnosis of afib on Eliquis for anticoagulation.    Procedure:  L3-4 TFESI INJECTION  Date of procedure: 10/20/2018  CHADS2-VASc score of  3 (CHF, HTN, AGE, DM2, stroke/tia x 2, CAD, AGE, male) Patient has a hx of PE in 2004  Per office protocol, patient can hold Eliquis for 3 days prior to procedure.

## 2018-10-07 NOTE — Telephone Encounter (Signed)
   Primary Cardiologist: Thompson Grayer, MD  Chart reviewed as part of pre-operative protocol coverage. Requesting provider only requests pharmacy recommendations for anticoagulation medication, not medical clearance.  Per our pharmacy staff: Patient with diagnosis of afib on Eliquis for anticoagulation.    Procedure: L3-4 TFESI INJECTION Date of procedure: 10/20/2018  CHADS2-VASc score of  3 (CHF, HTN, AGE, DM2, stroke/tia x 2, CAD, AGE, male) Patient has a hx of PE in 2004  Per office protocol, patient can hold Eliquis for 3 days prior to procedure.    I will route this recommendation to the requesting party via Epic fax function and remove from pre-op pool.  Please call with questions.  Ledora Bottcher, PA 10/07/2018, 2:46 PM

## 2018-10-16 ENCOUNTER — Telehealth: Payer: Self-pay | Admitting: Cardiology

## 2018-10-16 NOTE — Telephone Encounter (Signed)
Patient called stating that he ran out of his Tikosyn and has missed 6 doses.  There are notes under meds stating patient needs an appt to get more refills.  It was noted that first attempt was made 12/1 but patient states he doesn't know anything about an appt.  Will call in Tikosyn [id number] BID #[id number] with no refills and informed patient he has to schedule an appt in order to get any further refills.

## 2018-10-21 ENCOUNTER — Other Ambulatory Visit: Payer: Self-pay | Admitting: Internal Medicine

## 2018-10-21 ENCOUNTER — Telehealth: Payer: Self-pay | Admitting: Gastroenterology

## 2018-10-21 NOTE — Telephone Encounter (Signed)
Pt stated that ins requires PA for dexilant.

## 2018-10-21 NOTE — Telephone Encounter (Signed)
Prior Auth submitted to [employer] 10-20-2018, Dixon Boos.

## 2018-10-21 NOTE — Telephone Encounter (Signed)
Eliquis 5mg  refill request received; pt is 58 yrs old, wt-128.3kg, Crea-1.01 on 11/15/2017, last seen by Roderic Palau on 11/15/2017 & has an appt with Dr. Rayann Heman on 11/07/2018; will send in refill to requested pharmacy.

## 2018-10-24 NOTE — Telephone Encounter (Signed)
PA Approved for Dexilant. [employer]: [id number]. (PA#: [id number]) Called and spoke to [facility] on [address]. They will reprocess and let the pt know.  Called and notified pt.

## 2018-10-24 NOTE — Telephone Encounter (Signed)
PT would like to know the status on his PA .[name]

## 2018-11-07 ENCOUNTER — Encounter: Payer: Self-pay | Admitting: Internal Medicine

## 2018-11-07 ENCOUNTER — Ambulatory Visit (INDEPENDENT_AMBULATORY_CARE_PROVIDER_SITE_OTHER): Payer: Medicaid Other | Admitting: Internal Medicine

## 2018-11-07 VITALS — BP 146/100 | HR 64 | Ht 71.0 in | Wt 281.2 lb

## 2018-11-07 DIAGNOSIS — I428 Other cardiomyopathies: Secondary | ICD-10-CM | POA: Diagnosis not present

## 2018-11-07 DIAGNOSIS — I480 Paroxysmal atrial fibrillation: Secondary | ICD-10-CM

## 2018-11-07 DIAGNOSIS — I495 Sick sinus syndrome: Secondary | ICD-10-CM | POA: Diagnosis not present

## 2018-11-07 DIAGNOSIS — G4733 Obstructive sleep apnea (adult) (pediatric): Secondary | ICD-10-CM | POA: Diagnosis not present

## 2018-11-07 DIAGNOSIS — Z950 Presence of cardiac pacemaker: Secondary | ICD-10-CM

## 2018-11-07 NOTE — Progress Notes (Signed)
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  Since last being seen in our clinic, the patient reports doing very well.  Today, he denies symptoms of palpitations, chest pain, shortness of breath,  lower extremity edema, dizziness, presyncope, or syncope.  The patient is otherwise without complaint today.   Past Medical History:  Diagnosis Date  . Atrial fibrillation ([place])    s/p afib ablation  PVI 5/12 JA  . CAD -nonobstructive    S/P nstemi-type II cath 05/2008 - nonobs. dzs.  . DDD (degenerative disc disease)    evaluate by neurosurgery is ongoing  . Depression    ptsd  . Erectile dysfunction   . Headache(784.0)   . Hemorrhoids   . HTN (hypertension)   . Hyperlipemia   . Hyperthyroidism    Graves dz on methimazole and followed by [name]  . Obesity   . Pancreatitis   . Polysubstance abuse ([place])    cocaine, last used 1999  . PUD (peptic ulcer disease)    gastritis due to ETOH previously  . Pulmonary embolism ([place]) 04/2003   Hx of  . Rectal bleeding   . Sinus node dysfunction ([facility])    s/p MDT PPM - AAI - V port plugged  . Sleep apnea    uses CPAP [facility] 2 YRS  . Systolic and diastolic CHF, chronic ([place])    EF 35% by most recent echo 09/19/10  . 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 ANTERIOR CERVICAL DECOMP/DISCECTOMY FUSION  12/15/2011   Procedure: ANTERIOR CERVICAL DECOMPRESSION/DISCECTOMY FUSION 1 LEVEL;  Surgeon: [name], MD;  Location: [facility];  Service: Neurosurgery;  Laterality: N/A;  Cervical Six-Seven Anterior Cervical Diskectomy fusion with Allograft and Plating  . ATRIAL ABLATION SURGERY  12/2010   afib ablation by [name]  . ATRIAL FIBRILLATION ABLATION N/A 05/06/2017   Procedure: Atrial Fibrillation Ablation;  Surgeon: Thompson Grayer, MD;  Location: [facility];  Service: Cardiovascular;  Laterality: N/A;  . BACK SURGERY    . CARDIAC CATHETERIZATION     CADIAC ABLATION [name] 12/2010  . CARDIAC CATHETERIZATION N/A 05/08/2016   Procedure: Left Heart Cath and Coronary Angiography;  Surgeon: Peter M Martinique, MD;  Location: [place] [facility] LAB;  Service: Cardiovascular;  Laterality: N/A;  . Fingers removed from right hand.  2/84   traumatic work injury  . INSERT / REPLACE / REMOVE PACEMAKER     05/2008    . LUMBAR SPINE SURGERY  02/2009   Dr. Trenton Gammon  . PACEMAKER INSERTION  10/09   by Dr [name]  . TEE WITHOUT CARDIOVERSION N/A 05/05/2017   Procedure: TRANSESOPHAGEAL ECHOCARDIOGRAM (TEE);  Surgeon: Larey Dresser, MD;  Location[facility];  Service: Cardiovascular;  Laterality: N/A;    ROS- all systems are reviewed and negative except as per HPI above  Current Outpatient Medications  Medication Sig Dispense Refill  . amoxicillin (AMOXIL) 500 MG tablet Take 1 tablet (500 mg total) by mouth 2 (two) times daily. 14 tablet 0  . carvedilol (COREG) 25 MG tablet Take 1 tablet (25 mg total) by mouth 2 (two) times daily with a meal. Please call to schedule f/u with [name] (1st attempt) 60 tablet 0  . clobetasol cream (TEMOVATE) 1.27 % Apply 1 application topically  2 (two) times daily as needed (for rash).     [name] desonide (DESOWEN) 0.05 % cream Apply 1 application topically 2 (two) times daily as needed (for rash).     [name] dexlansoprazole (DEXILANT) 60 MG capsule Take 1 capsule (60 mg total) by mouth daily. 90 capsule 1  . dofetilide (TIKOSYN) 500 MCG capsule Take 1 capsule (500 mcg total) by mouth 2 (two) times daily. Please make yearly appt with Dr. [name] for December. 1st attempt 180 capsule 0  . ELIQUIS 5 MG TABS tablet TAKE 1 TABLET (5 MG TOTAL) BY MOUTH 2 (TWO) TIMES DAILY. 60 tablet 3  . gabapentin (NEURONTIN) 100 MG capsule Take 100 mg by mouth 2 (two) times daily.     . hydrocortisone cream 0.5 % Apply 1 application topically 2 (two) times daily.     [name] ibuprofen (ADVIL,MOTRIN) 200 MG tablet Take 800 mg by mouth every 6 (six) hours as needed for headache (back pain).    . methimazole (TAPAZOLE) 10 MG tablet Take 1 tablet (10 mg total) by mouth daily. 30 tablet 1  . oxyCODONE-acetaminophen (PERCOCET) 10-325 MG per tablet Take 1 tablet by mouth every 4 (four) hours as needed for pain. 29 tablet 0  . triamcinolone ointment (KENALOG) 0.1 % Apply 1 application topically 2 (two) times daily as needed (for rash).   3   No current facility-administered medications for this visit.     Physical Exam: Vitals:   11/07/18 1458  BP: (!) 146/100  Pulse: 64  SpO2: 99%  Weight: 281 lb 3.2 oz (127.6 kg)  Height: 5\' 11"  (1.803 m)    GEN- The patient is well appearing, alert and oriented x 3 today.   Head- normocephalic, atraumatic Eyes-  Sclera clear, conjunctiva pink Ears- hearing intact Oropharynx- clear Lungs- Clear to ausculation bilaterally, normal work of breathing Chest- pacemaker pocket is well healed Heart- Regular rate and rhythm, no murmurs, rubs or gallops, PMI not laterally displaced GI- soft, NT, ND, + BS Extremities- no clubbing, cyanosis, or edema  Pacemaker interrogation- reviewed in detail today,  See PACEART report  ekg tracing ordered today is personally reviewed and shows sinus rhythm  Assessment and Plan:  1. Symptomatic sinus bradycardia Normal pacemaker function See Pace Art report No changes today he is not device dependant today  2. Paroxysmal atrial fibrillation/ atach No afib post ablation  He has rare episodes of atach 160 bpm (<1% burden), longest episode 1 min 43 seconds Most recent episode was 10/12/2018, lasting 26 seconds He is doing well with tikosyn chads2vasc score is 2 Continue anticoagulation Bmet, mg today  3. Nonischemic CM EF 40-45% No changes  4. OSA Compliant with CPAP  carelink Return to see Dr [name] every 6 months for tikosyn follow-up I am happy to see anytime  Thompson Grayer  MD, [facility] 11/07/2018 3:12 PM

## 2018-11-07 NOTE — Patient Instructions (Signed)
Medication Instructions:  Your physician recommends that you continue on your current medications as directed. Please refer to the Current Medication list given to you today.  Labwork: You will have labs drawn today: BMP and Mg  Testing/Procedures: None ordered.  Follow-Up: Your physician recommends that you schedule a follow-up appointment in:   6 months with Dr. Klein  Any Other Special Instructions Will Be Listed Below (If Applicable).     If you need a refill on your cardiac medications before your next appointment, please call your pharmacy.  

## 2018-11-08 LAB — BASIC METABOLIC PANEL
BUN/Creatinine Ratio: 16 (ref 9–20)
BUN: 15 mg/dL (ref 6–24)
CO2: 23 mmol/L (ref 20–29)
CREATININE: 0.94 mg/dL (ref 0.76–1.27)
Calcium: 9.3 mg/dL (ref 8.7–10.2)
Chloride: 101 mmol/L (ref 96–106)
GFR calc Af Amer: 102 mL/min/{1.73_m2} (ref >59)
GFR, EST NON AFRICAN AMERICAN: 88 mL/min/{1.73_m2} (ref >59)
Glucose: 89 mg/dL (ref 65–99)
Potassium: 4.6 mmol/L (ref 3.5–5.2)
Sodium: 138 mmol/L (ref 134–144)

## 2018-11-08 LAB — CUP PACEART INCLINIC DEVICE CHECK
Implantable Lead Location: 753859
Implantable Lead Model: 5076
Implantable Pulse Generator Implant Date: 20091023
MDC IDC LEAD IMPLANT DT: 20091023
MDC IDC SESS DTM: 20200310100024

## 2018-11-08 LAB — MAGNESIUM: Magnesium: 2.2 mg/dL (ref 1.6–2.3)

## 2018-11-17 ENCOUNTER — Other Ambulatory Visit: Payer: Self-pay

## 2018-11-17 MED ORDER — DOFETILIDE 500 MCG PO CAPS: 500.0000 ug | ORAL_CAPSULE | Freq: Two times a day (BID) | ORAL | 3 refills | Status: DC

## 2018-11-21 ENCOUNTER — Other Ambulatory Visit: Payer: Self-pay | Admitting: Internal Medicine

## 2018-11-28 ENCOUNTER — Other Ambulatory Visit: Payer: Self-pay

## 2018-11-28 ENCOUNTER — Encounter: Payer: Medicaid Other | Admitting: *Deleted

## 2018-11-29 ENCOUNTER — Telehealth: Payer: Self-pay

## 2018-11-29 NOTE — Telephone Encounter (Signed)
Unable to speak  with patient to remind of missed remote transmission 

## 2019-01-17 ENCOUNTER — Telehealth: Payer: Self-pay | Admitting: Internal Medicine

## 2019-01-17 DIAGNOSIS — G4733 Obstructive sleep apnea (adult) (pediatric): Secondary | ICD-10-CM

## 2019-01-17 NOTE — Telephone Encounter (Signed)
Per pt, [facility] called him and stated that he could not buy supplies from their company over the phone. Pt called another mask supply company and the mask $50+ and he can not afford this. Requesting rec's. Cb is [phone].

## 2019-01-17 NOTE — Telephone Encounter (Signed)
Pt states seal broke on cpap mask, has not used cpap in 3 nights. Order has been placed CY please sign order.

## 2019-01-17 NOTE — Telephone Encounter (Signed)
Spoke with pt, he stated that [facility] could not give him another mask until September. I called [facility] and they stated that they gave him a number to purchase a mask online. I spoke with pt, he states he could not afford what was online. I asked Janett Billow if I could give the patient some parts that are broken on his CPAP mask. She agreed and I left them up front for him to pick up. Pt understood and nothing further is needed.

## 2019-02-09 ENCOUNTER — Telehealth: Payer: Self-pay | Admitting: Internal Medicine

## 2019-02-09 DIAGNOSIS — G4733 Obstructive sleep apnea (adult) (pediatric): Secondary | ICD-10-CM

## 2019-02-09 NOTE — Telephone Encounter (Signed)
Yes fine to make DME switch at patient request

## 2019-02-09 NOTE — Telephone Encounter (Signed)
Order placed. I called pt to let him know but there was no answer. Left voicemail on machine to call back.

## 2019-02-09 NOTE — Telephone Encounter (Signed)
Pt called requesting to switch DME companies from [facility] to [facility] for his CPAP. Dr. Annamaria Boots, please advise if you are fine with this being done for pt. Thanks!

## 2019-02-13 NOTE — Telephone Encounter (Signed)
Left message for patient to call back  

## 2019-02-14 NOTE — Telephone Encounter (Signed)
Attempted to call pt but unable to reach. Left detailed message for pt letting him know that we did place order to have DME changes per his request. Nothing further needed.

## 2019-02-16 ENCOUNTER — Telehealth: Payer: Self-pay | Admitting: Internal Medicine

## 2019-02-16 NOTE — Telephone Encounter (Signed)
Spoke with pt. States he is having issues with his CPAP machine. Reports that he wakes up every morning with dry mouth and sinus congestion. Pt would like to know if there is something that can be done about this. Other than these 2 issues, pt isn't having any other problems with using his CPAP.  CY - please advise.

## 2019-02-17 NOTE — Telephone Encounter (Signed)
Awaiting response from CY

## 2019-02-20 NOTE — Telephone Encounter (Signed)
Called pt and advised message from the provider. Pt understood and verbalized understanding. Nothing further is needed.    

## 2019-02-20 NOTE — Telephone Encounter (Signed)
CY just wanted to make sure you received this message.

## 2019-02-20 NOTE — Telephone Encounter (Signed)
My first thought would be to turn up his humidifier to help with dryness  He would have gotten a small booklet of intsructions with his CPAP. This would show him how to adjust his own humidifier. He can also ask his DME company for help with it.

## 2019-02-24 ENCOUNTER — Other Ambulatory Visit: Payer: Self-pay | Admitting: Internal Medicine

## 2019-02-24 NOTE — Telephone Encounter (Signed)
Age 58, weight 127.6kg, SCr 0.94 on 11/07/18, last OV March 2020, afib indication

## 2019-02-27 ENCOUNTER — Telehealth: Payer: Self-pay | Admitting: Internal Medicine

## 2019-02-27 DIAGNOSIS — G4733 Obstructive sleep apnea (adult) (pediatric): Secondary | ICD-10-CM

## 2019-02-27 NOTE — Telephone Encounter (Signed)
Spoke with pt and he is requesting a new cpap machine.  Pt stated it is leaking and he has had it for over 5 years.  May I place an order to [facility] Dr Annamaria Boots?

## 2019-02-27 NOTE — Telephone Encounter (Signed)
ATC, line went to voicemail. I have left a message to this pt letting him know the order was placed and that if he needed anything else to give our office a call back.   Order has been placed. Nothing further needed at this time.

## 2019-02-27 NOTE — Telephone Encounter (Signed)
Yes thanks- DME please replace old CPAP machine, auto 5-15, mask of choic,e humidifier, supplies, airview/ card

## 2019-03-01 ENCOUNTER — Other Ambulatory Visit: Payer: Self-pay | Admitting: Internal Medicine

## 2019-03-01 DIAGNOSIS — G4733 Obstructive sleep apnea (adult) (pediatric): Secondary | ICD-10-CM

## 2019-03-07 ENCOUNTER — Encounter: Payer: Self-pay | Admitting: *Deleted

## 2019-03-21 ENCOUNTER — Encounter: Payer: Self-pay | Admitting: Gastroenterology

## 2019-04-04 ENCOUNTER — Telehealth: Payer: Self-pay | Admitting: Internal Medicine

## 2019-04-04 NOTE — Telephone Encounter (Signed)
Called and spoke with pt who stated he believes he has a sinus infection. Pt said he has had pain in his sinus area and stated that this has now been going on x2 weeks.  Pt has been waking up with congestion, sinus drainage, and will also cough up phlegm. Pt said DME redid pressure with machine and changed the water. Pt said he believes the mask needs to be changed as well and stated he has tried calling [facility] in regards to changing mask out.  Pt said he went to store and got tylenol sinus and said that is currently helping but states that he does not want to stay on that long term.  Pt said he has now been congested for a month and he believes all symptoms have been coming from his cpap.   Due to pt leaving a message for [facility] to return his call and has not been able to receive a call back from them, I told pt that I would call them and he verbalized understanding. Called [facility] and spoke with Caryl Pina stating to her that pt has been trying to reach someone from [facility] but has not been able to get anyone. Caryl Pina saw where pt has called and she said she would reach out to him. Nothing further needed.

## 2019-05-09 ENCOUNTER — Other Ambulatory Visit: Payer: Self-pay

## 2019-05-09 ENCOUNTER — Encounter: Payer: Self-pay | Admitting: Internal Medicine

## 2019-05-09 ENCOUNTER — Ambulatory Visit: Payer: Medicaid Other | Admitting: Internal Medicine

## 2019-05-09 VITALS — BP 136/80 | HR 60 | Ht 71.0 in | Wt 289.2 lb

## 2019-05-09 DIAGNOSIS — I480 Paroxysmal atrial fibrillation: Secondary | ICD-10-CM | POA: Diagnosis not present

## 2019-05-09 DIAGNOSIS — I428 Other cardiomyopathies: Secondary | ICD-10-CM | POA: Diagnosis not present

## 2019-05-09 DIAGNOSIS — Z950 Presence of cardiac pacemaker: Secondary | ICD-10-CM | POA: Diagnosis not present

## 2019-05-09 DIAGNOSIS — Z20822 Contact with and (suspected) exposure to covid-19: Secondary | ICD-10-CM

## 2019-05-09 LAB — CUP PACEART INCLINIC DEVICE CHECK
Battery Impedance: 974 Ohm
Battery Remaining Longevity: 86 mo
Battery Voltage: 2.78 V
Brady Statistic RA Percent Paced: 3 %
Date Time Interrogation Session: 20200908192254
Implantable Lead Implant Date: 20091023
Implantable Lead Location: 753859
Implantable Lead Model: 5076
Implantable Pulse Generator Implant Date: 20091023
Lead Channel Impedance Value: 441 Ohm
Lead Channel Impedance Value: 67 Ohm
Lead Channel Pacing Threshold Amplitude: 0.5 V
Lead Channel Pacing Threshold Pulse Width: 0.4 ms
Lead Channel Sensing Intrinsic Amplitude: 2 mV
Lead Channel Setting Pacing Amplitude: 2 V

## 2019-05-09 NOTE — Progress Notes (Signed)
Patient Care Team: [facility], [facility] as PCP - Bethena Roys, MD as PCP - Cardiology (Cardiology) Sable Feil, MD (Gastroenterology) Luvenia Starch Darlin Drop as Physician Assistant (Neurosurgery) Clydell Hakim, MD as Consulting Physician (Anesthesiology)   HPI  [name] is a 58 y.o. male Seen in followup for pacemaker--dual-chamber with a ventricular port plugged  implanted for sinus node dysfunction and paroxysmal atrial arrhythmias which persisted despite dofetilide. He underwent catheter ablation of atrial fibrillation 2012. He had been on dofetilide stopped March 2014 subsequently resumed   He is seeing Dr. Greggory Brandy on a number of occasions to consider repeat ablation, most recently 1/18. At that time the patient again declined ablation. Dr. Nikki Dom observation was given his recurrent chest pain, Lantus be a consequence of the dyspepsia associated with Pradaxa. This was discontinued and he was started on apixoban.  There was no impact by making the switch. Underwent atrial fibrillation ablation 9/18 (Dr. Greggory Brandy) maintained on dofetilide   History in the past of treated hypothyroidism  He has a history of ischemic heart disease with prior non-STEMI possibly type II as there was  no significant obstructive lesions noted. Catheterization was done in 2006 in 2009  showing nonobstructive disease. A Myoview scan 2011 was nonischemic. He also is a history of pulmonary embolism   Echocardiogram 2012 demonstrated mild LV dysfunction 45-50%   The patient denies chest pain, shortness of breath, nocturnal dyspnea, orthopnea or peripheral edema.  There have been no palpitations, lightheadedness or syncope.    DATE TEST    **11*/12 Echo   EF 45-50 %   8/17    Echo   EF 45-50 %   9/17 CATH Ef 45% No obstructive CAD   Date Cr K Mg TSH Hgb  8/18    0.42-0.297  17.1>14.9  3/20 0.94 4.6 2.2               Past Medical History:  Diagnosis Date  . Atrial fibrillation  ([place])    s/p afib ablation  PVI 5/12 JA  . CAD -nonobstructive    S/P nstemi-type II cath 05/2008 - nonobs. dzs.  . DDD (degenerative disc disease)    evaluate by neurosurgery is ongoing  . Depression    ptsd  . Erectile dysfunction   . Headache(784.0)   . Hemorrhoids   . HTN (hypertension)   . Hyperlipemia   . Hyperthyroidism    Graves dz on methimazole and followed by [name]  . Obesity   . Pancreatitis   . Polysubstance abuse ([place])    cocaine, last used 1999  . PUD (peptic ulcer disease)    gastritis due to ETOH previously  . Pulmonary embolism ([place]) 04/2003   Hx of  . Rectal bleeding   . Sinus node dysfunction ([facility])    s/p MDT PPM - AAI - V port plugged  . Sleep apnea    uses CPAP [facility] 2 YRS  . Systolic and diastolic CHF, chronic ([place])    EF 35% by most recent echo 09/19/10  . 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 ANTERIOR CERVICAL DECOMP/DISCECTOMY FUSION  12/15/2011   Procedure: ANTERIOR CERVICAL DECOMPRESSION/DISCECTOMY FUSION 1 LEVEL;  Surgeon: [name], MD;  Location: [place];  Service: Neurosurgery;  Laterality: N/A;  Cervical Six-Seven Anterior Cervical Diskectomy fusion with Allograft and Plating  . ATRIAL ABLATION SURGERY  12/2010  afib ablation by [name]  . ATRIAL FIBRILLATION ABLATION N/A 05/06/2017   Procedure: Atrial Fibrillation Ablation;  Surgeon: Thompson Grayer, MD;  Location: [place] [facility];  Service: Cardiovascular;  Laterality: N/A;  . BACK SURGERY    . CARDIAC CATHETERIZATION     CADIAC ABLATION [name] 12/2010  . CARDIAC CATHETERIZATION N/A 05/08/2016   Procedure: Left Heart Cath and Coronary Angiography;  Surgeon: Peter M Martinique, MD;  Location: [place] [facility];  Service: Cardiovascular;  Laterality: N/A;  . Fingers removed from right hand.  2/84   traumatic work injury  . INSERT / REPLACE / REMOVE PACEMAKER     05/2008    . LUMBAR SPINE SURGERY   02/2009   Dr. Trenton Gammon  . PACEMAKER INSERTION  10/09   by Dr [name]  . TEE WITHOUT CARDIOVERSION N/A 05/05/2017   Procedure: TRANSESOPHAGEAL ECHOCARDIOGRAM (TEE);  Surgeon: Larey Dresser, MD;  Location: [facility];  Service: Cardiovascular;  Laterality: N/A;    Current Outpatient Medications  Medication Sig Dispense Refill  . carvedilol (COREG) 25 MG tablet Take 1 tablet (25 mg total) by mouth 2 (two) times daily with a meal. 180 tablet 3  . clobetasol cream (TEMOVATE) [id number] % Apply 1 application topically 2 (two) times daily as needed (for rash).     [name] desonide (DESOWEN) 0.05 % cream Apply 1 application topically 2 (two) times daily as needed (for rash).     [name] dexlansoprazole (DEXILANT) 60 MG capsule Take 1 capsule (60 mg total) by mouth daily. 90 capsule 1  . dofetilide (TIKOSYN) 500 MCG capsule Take 1 capsule (500 mcg total) by mouth 2 (two) times daily. 180 capsule 3  . ELIQUIS 5 MG TABS tablet TAKE 1 TABLET BY MOUTH TWICE A DAY 60 tablet 5  . gabapentin (NEURONTIN) 100 MG capsule Take 300 mg by mouth 2 (two) times daily. Patient takes 1 tablet in the morning and 2 tablets at night    . ibuprofen (ADVIL,MOTRIN) 200 MG tablet Take 800 mg by mouth every 6 (six) hours as needed for headache (back pain).    . methimazole (TAPAZOLE) 10 MG tablet Take 1 tablet (10 mg total) by mouth daily. 30 tablet 1  . oxyCODONE-acetaminophen (PERCOCET) 10-325 MG per tablet Take 1 tablet by mouth every 4 (four) hours as needed for pain. 29 tablet 0  . triamcinolone ointment (KENALOG) 0.1 % Apply 1 application topically 2 (two) times daily as needed (for rash).   3   No current facility-administered medications for this visit.     Allergies  Allergen Reactions  . Celecoxib Itching  . Hydrocodone Itching  . Prednisolone Itching  . Potassium Chloride Other (See Comments)    Dizziness and nausea     Review of Systems negative except from HPI and PMH  Physical Exam BP 136/80   Pulse 60   Ht 5\' 11"   (1.803 m)   Wt 289 lb 3.2 oz (131.2 kg)   SpO2 92%   BMI 40.34 kg/m   Well developed and well nourished in no acute distress HENT normal Neck supple with JVP-flat Clear Device pocket well healed; without hematoma or erythema.  There is no tethering  Regular rate and rhythm, no  murmur Abd-soft with active BS No Clubbing cyanosis   edema Skin-warm and dry A & Oriented  Grossly normal sensory and motor function  ECG sinus at 60 Intervals 17/13/46 Left axis deviation -47     Assessment and  Plan  Syncope  Atrial fibrillation persistent with rapid conduction  S/p PVI 2102, 2018   Sinus node dysfunction  High Risk Medication Surveillance- Dofetilide   Pacemaker-Medtronic -dual-chamber with ventricular port plugged--AAI The patient's device was interrogated.  The information was reviewed. No changes were made in the programming.     GERD  Treated hyperthyroidism  Infrequent atrial tachycardia on dofetilide.  Overall feeling much better.  No bleeding on apixaban.  Will need to check hemoglobin.  Has had polycythemia in the past.  History of treated hypothyroidism.  It is been about 2 years since his last TSH.  We will recheck it.  Compliant with CPAP. We'll plan DC cardioversion initially. I will then have him follow up with DC-NP in about 3 or 4 weeks to clarify whether he would like to pursue catheter ablation or not.  He would like to see GI again because of reflux problems.  He is compliant with his sleep apnea.    We spent more than 50% of our >25 min visit in face to face counseling regarding the above

## 2019-05-09 NOTE — Patient Instructions (Signed)
Medication Instructions:  Your physician recommends that you continue on your current medications as directed. Please refer to the Current Medication list given to you today.  Labwork: You will have labs drawn today: CBC, BMP, Mg  Testing/Procedures: None ordered.  Follow-Up: Your physician recommends that you schedule a follow-up appointment in:   6 months with Dr. [name]  Any Other Special Instructions Will Be Listed Below (If Applicable).     If you need a refill on your cardiac medications before your next appointment, please call your pharmacy.

## 2019-05-10 LAB — BASIC METABOLIC PANEL
BUN/Creatinine Ratio: 19 (ref 9–20)
BUN: 18 mg/dL (ref 6–24)
CO2: 22 mmol/L (ref 20–29)
Calcium: 9.2 mg/dL (ref 8.7–10.2)
Chloride: 103 mmol/L (ref 96–106)
Creatinine, Ser: 0.95 mg/dL (ref 0.76–1.27)
GFR calc Af Amer: 101 mL/min/{1.73_m2} (ref >59)
GFR calc non Af Amer: 87 mL/min/{1.73_m2} (ref >59)
Glucose: 93 mg/dL (ref 65–99)
Potassium: 3.6 mmol/L (ref 3.5–5.2)
Sodium: 141 mmol/L (ref 134–144)

## 2019-05-10 LAB — CBC
Hematocrit: 40.5 % (ref 37.5–51.0)
Hemoglobin: 13.5 g/dL (ref 13.0–17.7)
MCH: 26.9 pg (ref 26.6–33.0)
MCHC: 33.3 g/dL (ref 31.5–35.7)
MCV: 81 fL (ref 79–97)
Platelets: 253 10*3/uL (ref 150–450)
RBC: 5.01 x10E6/uL (ref 4.14–5.80)
RDW: 15.9 % — ABNORMAL HIGH (ref 11.6–15.4)
WBC: 5.5 10*3/uL (ref 3.4–10.8)

## 2019-05-10 LAB — TSH: TSH: 2.11 u[IU]/mL (ref 0.450–4.500)

## 2019-05-10 LAB — MAGNESIUM: Magnesium: 2 mg/dL (ref 1.6–2.3)

## 2019-05-11 LAB — NOVEL CORONAVIRUS, NAA: SARS-CoV-2, NAA: NOT DETECTED

## 2019-05-16 ENCOUNTER — Encounter: Payer: Self-pay | Admitting: Internal Medicine

## 2019-05-18 ENCOUNTER — Ambulatory Visit: Payer: Medicaid Other | Admitting: Gastroenterology

## 2019-05-18 ENCOUNTER — Ambulatory Visit: Payer: Medicaid Other | Admitting: Internal Medicine

## 2019-05-18 ENCOUNTER — Telehealth: Payer: Self-pay

## 2019-05-18 ENCOUNTER — Encounter: Payer: Self-pay | Admitting: Internal Medicine

## 2019-05-18 ENCOUNTER — Encounter: Payer: Self-pay | Admitting: Gastroenterology

## 2019-05-18 ENCOUNTER — Ambulatory Visit (INDEPENDENT_AMBULATORY_CARE_PROVIDER_SITE_OTHER): Payer: Medicaid Other

## 2019-05-18 ENCOUNTER — Other Ambulatory Visit: Payer: Self-pay

## 2019-05-18 VITALS — BP 132/90 | HR 88 | Temp 98.2°F | Ht 71.0 in | Wt 285.4 lb

## 2019-05-18 VITALS — BP 140/90 | HR 67 | Temp 98.7°F | Ht 71.0 in | Wt 284.4 lb

## 2019-05-18 DIAGNOSIS — R079 Chest pain, unspecified: Secondary | ICD-10-CM | POA: Diagnosis not present

## 2019-05-18 DIAGNOSIS — Z23 Encounter for immunization: Secondary | ICD-10-CM

## 2019-05-18 DIAGNOSIS — J301 Allergic rhinitis due to pollen: Secondary | ICD-10-CM

## 2019-05-18 DIAGNOSIS — Z7901 Long term (current) use of anticoagulants: Secondary | ICD-10-CM | POA: Diagnosis not present

## 2019-05-18 DIAGNOSIS — R0789 Other chest pain: Secondary | ICD-10-CM

## 2019-05-18 DIAGNOSIS — I482 Chronic atrial fibrillation, unspecified: Secondary | ICD-10-CM

## 2019-05-18 DIAGNOSIS — G4733 Obstructive sleep apnea (adult) (pediatric): Secondary | ICD-10-CM | POA: Diagnosis not present

## 2019-05-18 DIAGNOSIS — D132 Benign neoplasm of duodenum: Secondary | ICD-10-CM | POA: Diagnosis not present

## 2019-05-18 DIAGNOSIS — Z86010 Personal history of colonic polyps: Secondary | ICD-10-CM

## 2019-05-18 DIAGNOSIS — J328 Other chronic sinusitis: Secondary | ICD-10-CM | POA: Diagnosis not present

## 2019-05-18 MED ORDER — SUPREP BOWEL PREP KIT 17.5-3.13-1.6 GM/177ML PO SOLN: ORAL | 0 refills | Status: DC

## 2019-05-18 MED ORDER — METHYLPREDNISOLONE ACETATE 80 MG/ML IJ SUSP
80.0000 mg | Freq: Once | INTRAMUSCULAR | Status: AC
  Administered 2019-05-18: 80 mg via INTRAMUSCULAR

## 2019-05-18 NOTE — Patient Instructions (Signed)
Order- CXR   Dx  Diffuse upper chest and back pain  Order- Depo 80    Dx Chronic sinusitis  Order- Flu vax- standard  Recommend Flonase otc nasal spray    1-2 puffs each nostril once daily at bedtime.  Ok to use  otc decongestants, cough meds etc as you find helpful  Please call if we can help

## 2019-05-18 NOTE — Telephone Encounter (Signed)
[facility] Pre-operative Risk Assessment     Request for surgical clearance:     Endoscopy Procedure  What type of surgery is being performed?     COLONOSCOPY AND UPPER ENDOSCOPY  When is this surgery scheduled?     06-14-2019  What type of clearance is required ?   Pharmacy  Are there any medications that need to be held prior to surgery and how long? ELIQUIS 3 DAYS  Practice name and name of physician performing surgery?   [facility]  What is your office phone and fax number?      Phone- [phone]  Fax- [place], CMA  Anesthesia type (None, local, MAC, general) ?       MAC  Thank you.

## 2019-05-18 NOTE — Telephone Encounter (Signed)
Patient with diagnosis of afib on Eliquis for anticoagulation.    Procedure: COLONOSCOPY AND UPPER ENDOSCOPY Date of procedure: 06/14/2019  CHADS2-VASc score of  5 (CHF, HTN, stroke/tia x 2/PE, CAD)  CrCl 114 ml/min  Patient is at high risk off anticoagulation due to history of PE. Would recommend holding Eliquis only 1 day prior to procedure. MD is requesting 2 day hold. Will route to Dr. [name] and [place] for final input.

## 2019-05-18 NOTE — Telephone Encounter (Signed)
I apologize - I did not mean to type 3 days to hold Eliquis.  Dr. Havery Moros is asking for a 2 day hold of Eliquis prior to his endoscopy procedure.  Sorry of the miscommunication. Thank you

## 2019-05-18 NOTE — Telephone Encounter (Signed)
Our recommendation is to hold eliquis 24 hours prior to the procedure. If further holding of anticoagulaton is required, I would defer to the decision of the procedure performing physician.

## 2019-05-18 NOTE — Patient Instructions (Addendum)
If you are age 60 or older, your body mass index should be between 23-30. Your Body mass index is 39.8 kg/m. If this is out of the aforementioned range listed, please consider follow up with your Primary Care Provider.  If you are age 64 or younger, your body mass index should be between 19-25. Your Body mass index is 39.8 kg/m. If this is out of the aformentioned range listed, please consider follow up with your Primary Care Provider.   To help prevent the possible spread of infection to our patients, communities, and staff; we will be implementing the following measures:  As of now we are not allowing any visitors/family members to accompany you to any upcoming appointments with [facility]. If you have any concerns about this please contact our office to discuss prior to the appointment.   You have been scheduled for an endoscopy and colonoscopy. Please follow the written instructions given to you at your visit today. Please pick up your prep supplies at the pharmacy within the next 1-3 days. If you use inhalers (even only as needed), please bring them with you on the day of your procedure. Your physician has requested that you go to www.startemmi.com and enter the access code given to you at your visit today. This web site gives a general overview about your procedure. However, you should still follow specific instructions given to you by our office regarding your preparation for the procedure.   You will be contacted by our office prior to your procedure for directions on holding your Eliquis. If you do not hear from our office 1 week prior to your scheduled procedure, please call [phone] to discuss.    Thank you for entrusting me with your care and for choosing [facility], Dr. Creston Cellar

## 2019-05-18 NOTE — Progress Notes (Signed)
HPI   male former smoker followed for OSA, complicated by CAD/CHF/P A. fib/pacemaker/Pradaxa, allergic rhinitis, hyperthyroid,  degenerative disc disease, GERD NPSG 2010- AHI 21/ hr  ---------------------------------------------------------------------------------------   09/11/2018- 58 year old male former smoker followed for OSA, complicated by CAD/CHF/P A. fib/pacemaker/Pradaxa, allergic rhinitis, hyperthyroid, degenerative disc disease, GERD CPAP auto 5-15/[facility]  -----OSA: DME [facility]. Pt wears CPAP nightly and DL attached. Questions sinus infection today. Download 93% compliance AHI 1.1/hour He admits he is not cleaning CPAP machine consistently and we discussed [employer] machines. Head cold 2 months ago with lingering nasal congestion occipital and maxillary headaches, intermittent discolored nasal discharge.  Chest feels clear.  Denies fever or blood.  05/18/2019- 58 year old male former smoker followed for OSA, complicated by CAD/CHF/P A. fib/pacemaker/Pradaxa, allergic rhinitis, hyperthyroid, degenerative disc disease, GERD, Hx PE 2004 CPAP auto 5-15/[facility]  Covid Neg 9/8 -----Follows for: OSA Body weight today 284 lbs Download compliance 97%, AHI 2.3/ hr Nasal congestion, drainage and cough- clear mucus, no fever, Covid neg last week.. He blames his CPAP machine- discussed filter and humidifier. Eligible to replace in 2012.  For 2 weeks has had diffuse pains constant in neck, upper back, upper chest. Hx C-spine fusion. He thinks he pulled too hard doing work.   ROS-see HPI   + = positive Constitutional:    weight loss, night sweats, fevers, chills, fatigue, lassitude. HEENT:    headaches, difficulty swallowing, tooth/dental problems, sore throat,       sneezing, itching, ear ache, +nasal congestion, post nasal drip, snoring CV:    +chest pain, orthopnea, PND, swelling in lower extremities, anasarca,                                                    dizziness,  palpitations Resp:   +shortness of breath with exertion or at rest.                +productive cough,   non-productive cough, coughing up of blood.              change in color of mucus.  wheezing.   Skin:    rash or lesions. GI:  No-   heartburn, indigestion, abdominal pain, nausea, vomiting GU: . MS:   joint pain, stiffness,  Neuro-     nothing unusual Psych:  change in mood or affect.  depression or anxiety.   memory loss.  OBJ- Physical Exam General- Alert, Oriented, Affect-appropriate, Distress- none acute, + overweight Skin- rash-none, lesions- none, excoriation- none Lymphadenopathy- none Head- atraumatic            Eyes- Gross vision intact, PERRLA, conjunctivae and secretions clear            Ears- Hearing, canals-normal            Nose- , + turbinate edema R, no-Septal dev, mucus, polyps, erosion, perforation             Throat- Mallampati III-IV , mucosa clear/not dry , drainage- none, tonsils- atrophic Neck- flexible , trachea midline, no stridor , thyroid nl, carotid no bruit Chest - symmetrical excursion , unlabored           Heart/CV- RRR , no murmur , no gallop  , no rub, nl s1 s2                           -  JVD- none , edema- none, stasis changes- none, varices- none           Lung- clear to P&A, wheeze- none, cough- none , dullness-none, rub- none           Chest wall- L pacemaker Abd-  Br/ Gen/ Rectal- Not done, not indicated Extrem- cyanosis- none, clubbing, none, atrophy- none, strength- nl. +Missing distal fingers R hand. Neuro- grossly intact to observation

## 2019-05-18 NOTE — Progress Notes (Signed)
HPI :  58 y/o male istory of AF, CAD,CHF,PE,OSA, pacemakerin place, onEliquis, here for a follow up visit.   Since I have last seen him he had an endoscopy and colonoscopy performed in July 2019.  His endoscopy did not show any evidence of Barrett's esophagus which is good news.  He had some benign gastric polyps removed, of note he also had a moderately sized duodenal adenoma removed.  His colonoscopy was remarkable for 3 polyps that were removed, the largest was 12 mm in the ascending colon.  Due to significant looping and difficult positioning this was actually a rather hard polyp to remove I had recommended a follow-up colonoscopy 1 year later.  He states the Dexilant is working quite well for his reflux symptoms at present.  He denies any breakthrough reflux, denies any dysphasia.  He is tolerating the regimen well.  He denies any bowel problems.  He has occasional very mild rectal bleeding he thinks due to hemorrhoids, otherwise denies constipation or diarrhea.   He does have some nonspecific cramps in his upper and lower abdomen at times.  He denies any active cardiopulmonary symptoms that are bothering him.  He continues to take Eliquis.  He has recently seen cardiology.   Prior workup: Colonoscopy 01/22/2010 - hemorrhoids, otherwise normal EGD 11/02/2007 - distal esophagitis  TEE echo 05/05/2017 - EF 35-40%   EGD 03/02/2018 - A 2 cm hiatal hernia was present. - The exam of the esophagus was otherwise normal. No Barrett's - A single 4 mm sessile polyp was found in the gastric antrum. Biopsies were taken with a cold forceps for histology. - The exam of the stomach was otherwise normal. - A single 4 mm sessile polyp was found in the second portion of the duodenum in the sweep at angulated turn and quite difficult to visualize initially. The polyp was removed with a cold biopsy forceps given its location. Resection and retrieval were thought to have been complete. - A single 7 mm  sessile polyp was found in the third portion of the duodenum. The polyp was removed with a hot snare. Resection and retrieval were complete. Some oozing occurred post polypectomy. To prevent bleeding after the polypectomy in light of the patient's Eliquis use, two hemostatic clips were successfully placed. (one other clip misfired, and another clip that was used was defective) - The exam of the duodenum was otherwise normal.  Colonoscopy 03/02/2018 - Redundant colon with significant looping. - One 12 mm polyp in the ascending colon, removed with a cold snare. Resected and retrieved. - One 4 mm polyp in the transverse colon, removed with a cold snare. Resected and retrieved. - One 3 mm polyp at the recto-sigmoid colon, removed with a cold snare. Resected and retrieved. - Internal hemorrhoids. - The examination was otherwise normal.  Path shows one duodenal adenoma and colonic adenomas        Past Medical History:  Diagnosis Date  . Atrial fibrillation ([place])    s/p afib ablation  PVI 5/12 JA  . CAD -nonobstructive    S/P nstemi-type II cath 05/2008 - nonobs. dzs.  . DDD (degenerative disc disease)    evaluate by neurosurgery is ongoing  . Depression    ptsd  . Erectile dysfunction   . Headache(784.0)   . Hemorrhoids   . HTN (hypertension)   . Hyperlipemia   . Hyperthyroidism    [place] dz on methimazole and followed by [name]  . Obesity   . Pancreatitis   .  Polysubstance abuse ([place])    cocaine, last used 1999  . PUD (peptic ulcer disease)    gastritis due to ETOH previously  . Pulmonary embolism ([place]) 04/2003   Hx of  . Rectal bleeding   . Sinus node dysfunction ([facility])    s/p MDT PPM - AAI - V port plugged  . Sleep apnea    uses CPAP [facility] 2 YRS  . Systolic and diastolic CHF, chronic ([place])    EF 35% by most recent echo 09/19/10  . 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 ANTERIOR  CERVICAL DECOMP/DISCECTOMY FUSION  12/15/2011   Procedure: ANTERIOR CERVICAL DECOMPRESSION/DISCECTOMY FUSION 1 LEVEL;  Surgeon: [name], MD;  Location: [place] [facility];  Service: Neurosurgery;  Laterality: N/A;  Cervical Six-Seven Anterior Cervical Diskectomy fusion with Allograft and Plating  . ATRIAL ABLATION SURGERY  12/2010   afib ablation by [name]  . ATRIAL FIBRILLATION ABLATION N/A 05/06/2017   Procedure: Atrial Fibrillation Ablation;  Surgeon: Thompson Grayer, MD;  Location: [place];  Service: Cardiovascular;  Laterality: N/A;  . BACK SURGERY    . CARDIAC CATHETERIZATION     CADIAC ABLATION [name] 12/2010  . CARDIAC CATHETERIZATION N/A 05/08/2016   Procedure: Left Heart Cath and Coronary Angiography;  Surgeon: Peter M Martinique, MD;  Location: [place] [facility];  Service: Cardiovascular;  Laterality: N/A;  . Fingers removed from right hand.  2/84   traumatic work injury  . INSERT / REPLACE / REMOVE PACEMAKER     05/2008    . LUMBAR SPINE SURGERY  02/2009   Dr. Trenton Gammon  . PACEMAKER INSERTION  10/09   by Dr [name]  . TEE WITHOUT CARDIOVERSION N/A 05/05/2017   Procedure: TRANSESOPHAGEAL ECHOCARDIOGRAM (TEE);  Surgeon: Larey Dresser, MD;  Location[facility];  Service: Cardiovascular;  Laterality: N/A;   Family History  Problem Relation Age of Onset  . Heart disease Mother   . Breast cancer Mother   . Lung cancer Father   . Diabetes Other   . Colon cancer Neg Hx    Social History   Tobacco Use  . Smoking status: Former Smoker    Packs/day: 1.00    Years: 15.00    Pack years: 15.00    Types: Cigarettes    Quit date: 04/04/2003    Years since quitting: 16.1  . Smokeless tobacco: Never Used  Substance Use Topics  . Alcohol use: No    Comment: formerly heavy ETOH,  now drinks a fifth of liquor every 2 weeks  . Drug use: No    Comment: previously used crack cocaine, quit 1999,  + marijuana   Current Outpatient Medications  Medication Sig Dispense Refill  .  carvedilol (COREG) 25 MG tablet Take 1 tablet (25 mg total) by mouth 2 (two) times daily with a meal. 180 tablet 3  . desonide (DESOWEN) 0.05 % cream Apply 1 application topically 2 (two) times daily as needed (for rash).     [name] dexlansoprazole (DEXILANT) 60 MG capsule Take 1 capsule (60 mg total) by mouth daily. 90 capsule 1  . dofetilide (TIKOSYN) 500 MCG capsule Take 1 capsule (500 mcg total) by mouth 2 (two) times daily. 180 capsule 3  . ELIQUIS 5 MG TABS tablet TAKE 1 TABLET BY MOUTH TWICE A DAY 60 tablet 5  . gabapentin (NEURONTIN) 100 MG capsule Take 300 mg  by mouth 2 (two) times daily. Patient takes 1 tablet in the morning and 2 tablets at night    . ibuprofen (ADVIL,MOTRIN) 200 MG tablet Take 800 mg by mouth every 6 (six) hours as needed for headache (back pain).    . methimazole (TAPAZOLE) 10 MG tablet Take 1 tablet (10 mg total) by mouth daily. 30 tablet 1  . oxyCODONE-acetaminophen (PERCOCET) 10-325 MG per tablet Take 1 tablet by mouth every 4 (four) hours as needed for pain. 29 tablet 0  . triamcinolone ointment (KENALOG) 0.1 % Apply 1 application topically 2 (two) times daily as needed (for rash).   3   No current facility-administered medications for this visit.    Allergies  Allergen Reactions  . Celecoxib Itching  . Hydrocodone Itching  . Prednisolone Itching  . Potassium Chloride Other (See Comments)    Dizziness and nausea      Review of Systems: All systems reviewed and negative except where noted in HPI.   Lab Results  Component Value Date   WBC 5.5 05/09/2019   HGB 13.5 05/09/2019   HCT 40.5 05/09/2019   MCV 81 05/09/2019   PLT 253 05/09/2019    Lab Results  Component Value Date   CREATININE 0.95 05/09/2019   BUN 18 05/09/2019   NA 141 05/09/2019   K 3.6 05/09/2019   CL 103 05/09/2019   CO2 22 05/09/2019    Lab Results  Component Value Date   ALT 23 04/04/2017   AST 26 04/04/2017   ALKPHOS 75 04/04/2017   BILITOT 0.9 04/04/2017     Physical  Exam: BP 132/90 (BP Location: Left Arm, Patient Position: Sitting, Cuff Size: Normal)   Pulse 88   Temp 98.2 F (36.8 C) (Oral)   Ht 5\' 11"  (075-GRM m)   Wt 285 lb 6 oz (129.4 kg)   BMI 39.80 kg/m  Constitutional: Pleasant,well-developed, male in no acute distress. HEENT: Normocephalic and atraumatic. Conjunctivae are normal. No scleral icterus. Neck supple.  Cardiovascular: Normal rate, regular rhythm.  Pulmonary/chest: Effort normal and breath sounds normal. No wheezing, rales or rhonchi. Abdominal: Soft, nondistended, nontender. There are no masses palpable. No hepatomegaly. Extremities: no edema Lymphadenopathy: No cervical adenopathy noted. Neurological: Alert and oriented to person place and time. Skin: Skin is warm and dry. No rashes noted. Psychiatric: Normal mood and affect. Behavior is normal.   ASSESSMENT AND PLAN: 58 year old male here for reassessment the following issues:  Duodenal adenoma / History of colon polyps / Anticoagulated - reviewed endoscopic findings with him from the last exams he had.  He did not have a very large adenoma in the right colon removed however it was technically challenging to remove given positioning issues at the time, removed in piecemeal.  I am recommending surveillance colonoscopy at this time to ensure it was removed in entirety prior to lengthening his surveillance to 3 years.  At this time time given his duodenal adenomas would recommend a surveillance EGD at the same time.  I discussed risk and benefits of these exams and he wanted to proceed.  We will need to hold the Eliquis for 2 days prior to his exam, we will ask for approval to do so to reduce bleeding risks.  He agreed with the plan, further recommendations pending results.  Prospect Cellar, MD [facility]

## 2019-05-19 ENCOUNTER — Telehealth: Payer: Self-pay | Admitting: Internal Medicine

## 2019-05-19 ENCOUNTER — Telehealth: Payer: Self-pay

## 2019-05-19 NOTE — Telephone Encounter (Signed)
Result Notes for DG Chest 2 View  Notes recorded by Stephanie Coup, CMA on 05/19/2019 at 10:06 AM EDT  Pt informed:CXR- lungs are clear, pacemaker in place  Nothing further needed

## 2019-05-19 NOTE — Telephone Encounter (Signed)
   Primary Cardiologist: Thompson Grayer, MD  Chart reviewed as part of pre-operative protocol coverage. Given past medical history and time since last visit, based on ACC/AHA guidelines, [name] would be at acceptable risk for the planned procedure without further cardiovascular testing.   Patient has a diagnosis of atrial fibrillaiton on Eliquis for anticoagulation with a CHADS2-VASc score of  5 (CHF, HTN, stroke/tia x 2/PE, CAD)  Patient is at high risk off anticoagulation due to history of PE. Per pharmacy and Dr. Rayann Heman, would recommend holding Eliquis only 1 day prior to procedure. MD is requesting 2 day hold.  I will route this recommendation to the requesting party via Epic fax function and remove from pre-op pool.  Please call with questions.  Kathyrn Drown, NP 05/19/2019, 8:32 AM

## 2019-05-19 NOTE — Telephone Encounter (Signed)
Spoke with patient and told him he could hold his Eliquis for 2 days prior to his procedure.  Patient agreed.

## 2019-05-19 NOTE — Telephone Encounter (Signed)
Thanks Magda Paganini. I'd prefer a 2 day hold if possible due to this patient having small bowel and colon polyps removed, and associated bleeding risk. Thanks

## 2019-05-19 NOTE — Telephone Encounter (Signed)
Patient has procedure scheduled for 06/14/2019.  Our cardiac clearance letter asks to hold Eliquis for 2 days.  Cardiologist recommends only one day although he states he would defer to you on the number of days.  See phone note dated 05/18/2019.  Please advise.  Thank you.

## 2019-05-20 NOTE — Assessment & Plan Note (Signed)
Pradaxa, followed cardiology

## 2019-05-20 NOTE — Assessment & Plan Note (Signed)
Complaining of pains through upper back and chest, nonexertional. Suspect musculoskeletal.  Plan- CXR

## 2019-05-20 NOTE — Assessment & Plan Note (Signed)
Increased nasal drainage and cough may reflect current ragweed pollen season. Discussed Flonase and antihistamines.

## 2019-05-20 NOTE — Assessment & Plan Note (Signed)
Benefits with good compliance and control. Doubt his CPAP is causing nasal drainage and cough- look for other causes.

## 2019-05-22 NOTE — Telephone Encounter (Signed)
See telephone note from 9-18.  Patient informed by Julieanne Cotton, CMA, to hold Eliquis for 2 days prior to procedure.

## 2019-06-07 ENCOUNTER — Other Ambulatory Visit: Payer: Self-pay

## 2019-06-07 DIAGNOSIS — Z1159 Encounter for screening for other viral diseases: Secondary | ICD-10-CM

## 2019-06-13 ENCOUNTER — Telehealth: Payer: Self-pay

## 2019-06-13 LAB — SARS CORONAVIRUS 2 (TAT 6-24 HRS): SARS Coronavirus 2: NEGATIVE

## 2019-06-13 NOTE — Telephone Encounter (Addendum)
Covid-19 screening questions   Do you now or have you had a fever in the last 14 days? NO   Do you have any respiratory symptoms of shortness of breath or cough now or in the last 14 days? NO   Do you have any family members or close contacts with diagnosed or suspected Covid-19 in the past 14 days? NO   Have you been tested for Covid-19 and found to be positive? Patient went for covid testing yesterday for [name]. Pending results.

## 2019-06-14 ENCOUNTER — Ambulatory Visit (AMBULATORY_SURGERY_CENTER): Payer: Medicaid Other | Admitting: Gastroenterology

## 2019-06-14 ENCOUNTER — Encounter: Payer: Self-pay | Admitting: Gastroenterology

## 2019-06-14 ENCOUNTER — Other Ambulatory Visit: Payer: Self-pay

## 2019-06-14 VITALS — BP 146/98 | HR 59 | Temp 98.0°F | Resp 16 | Ht 71.0 in | Wt 284.0 lb

## 2019-06-14 DIAGNOSIS — D123 Benign neoplasm of transverse colon: Secondary | ICD-10-CM | POA: Diagnosis not present

## 2019-06-14 DIAGNOSIS — Z86010 Personal history of colonic polyps: Secondary | ICD-10-CM

## 2019-06-14 DIAGNOSIS — K449 Diaphragmatic hernia without obstruction or gangrene: Secondary | ICD-10-CM

## 2019-06-14 DIAGNOSIS — D122 Benign neoplasm of ascending colon: Secondary | ICD-10-CM | POA: Diagnosis not present

## 2019-06-14 DIAGNOSIS — D132 Benign neoplasm of duodenum: Secondary | ICD-10-CM | POA: Diagnosis not present

## 2019-06-14 MED ORDER — SODIUM CHLORIDE 0.9 % IV SOLN: 500.0000 mL | Freq: Once | INTRAVENOUS | Status: DC

## 2019-06-14 MED ORDER — SUCRALFATE 1 GM/10ML PO SUSP: 1.0000 g | Freq: Four times a day (QID) | ORAL | 1 refills | Status: DC

## 2019-06-14 NOTE — Patient Instructions (Addendum)
Thank you for allowing [name] to care for you today!  Await pathology results by mail, approximately 2 weeks.  Resume previous diet and medications today.  Resume your Eliquis tomorrow.  Return to your normal activities tomorrow.  Recommend repeat colonoscopy in 3 years.  Recommend repeat EGD in 3-5 years for surveillance.    YOU HAD AN ENDOSCOPIC PROCEDURE TODAY AT [facility]:   Refer to the procedure report that was given to you for any specific questions about what was found during the examination.  If the procedure report does not answer your questions, please call your gastroenterologist to clarify.  If you requested that your care partner not be given the details of your procedure findings, then the procedure report has been included in a sealed envelope for you to review at your convenience later.  YOU SHOULD EXPECT: Some feelings of bloating in the abdomen. Passage of more gas than usual.  Walking can help get rid of the air that was put into your GI tract during the procedure and reduce the bloating. If you had a lower endoscopy (such as a colonoscopy or flexible sigmoidoscopy) you may notice spotting of blood in your stool or on the toilet paper. If you underwent a bowel prep for your procedure, you may not have a normal bowel movement for a few days.  Please Note:  You might notice some irritation and congestion in your nose or some drainage.  This is from the oxygen used during your procedure.  There is no need for concern and it should clear up in a day or so.  SYMPTOMS TO REPORT IMMEDIATELY:   Following lower endoscopy (colonoscopy or flexible sigmoidoscopy):  Excessive amounts of blood in the stool  Significant tenderness or worsening of abdominal pains  Swelling of the abdomen that is new, acute  Fever of 100F or higher   Following upper endoscopy (EGD)  Vomiting of blood or coffee ground material  New chest pain or pain under the shoulder  blades  Painful or persistently difficult swallowing  New shortness of breath  Fever of 100F or higher  Black, tarry-looking stools  For urgent or emergent issues, a gastroenterologist can be reached at any hour by calling [phone].   DIET:  We do recommend a small meal at first, but then you may proceed to your regular diet.  Drink plenty of fluids but you should avoid alcoholic beverages for 24 hours.  ACTIVITY:  You should plan to take it easy for the rest of today and you should NOT DRIVE or use heavy machinery until tomorrow (because of the sedation medicines used during the test).    FOLLOW UP: Our staff will call the number listed on your records 48-72 hours following your procedure to check on you and address any questions or concerns that you may have regarding the information given to you following your procedure. If we do not reach you, we will leave a message.  We will attempt to reach you two times.  During this call, we will ask if you have developed any symptoms of COVID 19. If you develop any symptoms (ie: fever, flu-like symptoms, shortness of breath, cough etc.) before then, please call [phone].  If you test positive for Covid 19 in the 2 weeks post procedure, please call and report this information to [name].    If any biopsies were taken you will be contacted by phone or by letter within the next 1-3 weeks.  Please call us  at [phone] if you have not heard about the biopsies in 3 weeks.    SIGNATURES/CONFIDENTIALITY: You and/or your care partner have signed paperwork which will be entered into your electronic medical record.  These signatures attest to the fact that that the information above on your After Visit Summary has been reviewed and is understood.  Full responsibility of the confidentiality of this discharge information lies with you and/or your care-partner.

## 2019-06-14 NOTE — Progress Notes (Signed)
To PACU, VSS. Report to [email or web address] 

## 2019-06-14 NOTE — Progress Notes (Signed)
Called to room to assist during endoscopic procedure.  Patient ID and intended procedure confirmed with present staff. Received instructions for my participation in the procedure from the performing physician.  

## 2019-06-14 NOTE — Progress Notes (Signed)
Temp taken by KA VS taken by CW 

## 2019-06-14 NOTE — Op Note (Signed)
[facility] Patient Name: [name] Procedure Date: 06/14/2019 2:30 PM MRN: [id number] Endoscopist: Remo Lipps P. Havery [name] , [name] Age: 58 Referring [name]:  Date of Birth: Jan 11, 1961 Gender: Male Account #: [id number] Procedure:                Colonoscopy Indications:              Surveillance: History of piecemeal removal of                            advanced adenoma on last colonoscopy (02/2018) Medicines:                Monitored Anesthesia Care Procedure:                Pre-Anesthesia Assessment:                           - Prior to the procedure, a History and Physical                            was performed, and patient medications and                            allergies were reviewed. The patient's tolerance of                            previous anesthesia was also reviewed. The risks                            and benefits of the procedure and the sedation                            options and risks were discussed with the patient.                            All questions were answered, and informed consent                            was obtained. Prior Anticoagulants: The patient has                            taken Eliquis (apixaban), last dose was 2 days                            prior to procedure. ASA Grade Assessment: III - A                            patient with severe systemic disease. After                            reviewing the risks and benefits, the patient was                            deemed in satisfactory condition to undergo the  procedure.                           After obtaining informed consent, the colonoscope                            was passed under direct vision. Throughout the                            procedure, the patient's blood pressure, pulse, and                            oxygen saturations were monitored continuously. The                            Colonoscope was introduced through the anus  and                            advanced to the the cecum, identified by                            appendiceal orifice and ileocecal valve. The                            colonoscopy was performed without difficulty. The                            patient tolerated the procedure well. The quality                            of the bowel preparation was adequate. The                            ileocecal valve, appendiceal orifice, and rectum                            were photographed. Scope In: 2:45:08 PM Scope Out: 3:06:33 PM Scope Withdrawal Time: 0 hours 16 minutes 48 seconds  Total Procedure Duration: 0 hours 21 minutes 25 seconds  Findings:                 The perianal and digital rectal examinations were                            normal.                           Two sessile polyps were found in the ascending                            colon. The polyps were 3 to 4 mm in size. The                            smaller lesion was noted at the site of the prior  large ascending colon polypectomy site. These                            polyps were removed with a cold snare. Resection                            and retrieval were complete.                           A 3 to 4 mm polyp was found in the transverse                            colon. The polyp was sessile. The polyp was removed                            with a cold snare. Resection and retrieval were                            complete.                           Internal hemorrhoids were found during retroflexion.                           The colon revealed excessive looping in the right                            colon. Prep in the right colon was fair but                            following lavage adequate views were obtained.                           The exam was otherwise without abnormality. Complications:            No immediate complications. Estimated blood loss:                             Minimal. Estimated Blood Loss:     Estimated blood loss was minimal. Impression:               - Two 3 to 4 mm polyps in the ascending colon,                            removed with a cold snare. Resected and retrieved.                           - One 3 to 4 mm polyp in the transverse colon,                            removed with a cold snare. Resected and retrieved.                           - Internal hemorrhoids.                           -  There was significant looping of the colon.                           - The examination was otherwise normal. Recommendation:           - Patient has a contact number available for                            emergencies. The signs and symptoms of potential                            delayed complications were discussed with the                            patient. Return to normal activities tomorrow.                            Written discharge instructions were provided to the                            patient.                           - Resume previous diet.                           - Continue present medications.                           - Resume Eliquis tomorrow                           - Await pathology results. Recommend repeat                            colonoscopy in 3 years given advanced adenoma                            removed one year ago [place]. Letricia Krinsky, [name] 06/14/2019 3:12:36 PM This report has been signed electronically.

## 2019-06-14 NOTE — Op Note (Signed)
[name]: [name] Procedure Date: 06/14/2019 2:31 PM MRN: [id number] Endoscopist: [name] , MD Age: 58 Referring MD:  Date of Birth: May 18, 1961 Gender: Male Account #: [id number] Procedure:                Upper GI endoscopy Indications:              surveillance of duodenal adenomas (removed in 2019) Medicines:                Monitored Anesthesia Care Procedure:                Pre-Anesthesia Assessment:                           - Prior to the procedure, a History and Physical                            was performed, and patient medications and                            allergies were reviewed. The patient's tolerance of                            previous anesthesia was also reviewed. The risks                            and benefits of the procedure and the sedation                            options and risks were discussed with the patient.                            All questions were answered, and informed consent                            was obtained. Prior Anticoagulants: The patient has                            taken Eliquis (apixaban), last dose was 2 days                            prior to procedure. ASA Grade Assessment: III - A                            patient with severe systemic disease. After                            reviewing the risks and benefits, the patient was                            deemed in satisfactory condition to undergo the                            procedure.  After obtaining informed consent, the endoscope was                            passed under direct vision. Throughout the                            procedure, the patient's blood pressure, pulse, and                            oxygen saturations were monitored continuously. The                            Endoscope was introduced through the mouth, and                            advanced to the second part of  duodenum. The upper                            GI endoscopy was accomplished without difficulty.                            The patient tolerated the procedure well. Scope In: Scope Out: Findings:                 Esophagogastric landmarks were identified: the                            Z-line was found at 38 cm, the gastroesophageal                            junction was found at 38 cm and the upper extent of                            the gastric folds was found at 41 cm from the                            incisors.                           A 3 cm hiatal hernia was present.                           The exam of the esophagus was otherwise normal.                           There were benign appearing hypertrophied folds                            around the pylorus - previously biopsied and no                            significant pathology noted, no further biopsies  taken today. The examined stomach was otherwise                            normal.                           The duodenal bulb and second portion of the                            duodenum were normal. No polyps noted Complications:            No immediate complications. Estimated blood loss:                            None. Estimated Blood Loss:     Estimated blood loss: none. Impression:               - Esophagogastric landmarks identified.                           - 3 cm hiatal hernia.                           - Normal stomach.                           - Normal duodenal bulb and second portion of the                            duodenum.                           - No polyps appreciated in the small bowel. Recommendation:           - Patient has a contact number available for                            emergencies. The signs and symptoms of potential                            delayed complications were discussed with the                            patient. Return to normal activities  tomorrow.                            Written discharge instructions were provided to the                            patient.                           - Resume previous diet.                           - Continue present medications.                           -  Repeat upper endoscopy in 3-5 years for                            surveillance given prior history of duodenal                            adenomas. [name] Lipps P. Armbruster, MD 06/14/2019 3:18:08 PM This report has been signed electronically.

## 2019-06-15 ENCOUNTER — Telehealth: Payer: Self-pay

## 2019-06-15 ENCOUNTER — Other Ambulatory Visit: Payer: Self-pay

## 2019-06-15 DIAGNOSIS — R1013 Epigastric pain: Secondary | ICD-10-CM

## 2019-06-15 NOTE — Telephone Encounter (Signed)
-----   Message from Yetta Flock, MD sent at 06/14/2019  6:18 PM EDT ----- Regarding: RUQ [name] [name] this patient has been having some postprandial epigastric pain. EGD today looked good. Can you order him a RUQ [name] to rule out gallstones? Thanks

## 2019-06-15 NOTE — Telephone Encounter (Signed)
Patient scheduled for [name] (RUQ) @ [facility] on 06/19/19. Patient to arrive at 7:45am and be NPO after midnight on Sun. Patient good with the above

## 2019-06-16 ENCOUNTER — Telehealth: Payer: Self-pay

## 2019-06-16 NOTE — Telephone Encounter (Signed)
  Follow up Call-  Call back number 06/14/2019 03/02/2018 08/05/2017  Post procedure Call Back phone  # [phone]  Permission to leave phone message Yes Yes -  Some recent data might be hidden     Patient questions:  Do you have a fever, pain , or abdominal swelling? No. Pain Score  0 *  Have you tolerated food without any problems? Yes.    Have you been able to return to your normal activities? Yes.    Do you have any questions about your discharge instructions: Diet   No. Medications  No. Follow up visit  No.  Do you have questions or concerns about your Care? No.  Actions: * If pain score is 4 or above: No action needed, pain <4.  1. Have you developed a fever since your procedure? no  2.   Have you had an respiratory symptoms (SOB or cough) since your procedure? no  3.   Have you tested positive for COVID 19 since your procedure no  4.   Have you had any family members/close contacts diagnosed with the COVID 19 since your procedure?  no   If yes to any of these questions please route to Joylene John, RN and Alphonsa Gin, Therapist, sports.

## 2019-06-19 ENCOUNTER — Other Ambulatory Visit: Payer: Self-pay

## 2019-06-19 ENCOUNTER — Ambulatory Visit (HOSPITAL_COMMUNITY)
Admission: RE | Admit: 2019-06-19 | Discharge: 2019-06-19 | Disposition: A | Discharge status: Home / Self Care | Payer: Medicaid Other | Source: Ambulatory Visit | Attending: Gastroenterology | Admitting: Gastroenterology

## 2019-06-19 DIAGNOSIS — R1013 Epigastric pain: Secondary | ICD-10-CM | POA: Diagnosis not present

## 2019-07-12 ENCOUNTER — Telehealth: Payer: Self-pay | Admitting: Internal Medicine

## 2019-07-12 NOTE — Telephone Encounter (Signed)
LMTCB x1 for pt.  

## 2019-07-13 NOTE — Telephone Encounter (Signed)
Left message for patient to call back  

## 2019-07-14 NOTE — Telephone Encounter (Signed)
LMTCB x3 for pt. We have attempted to contact pt several times with no success or call back from pt. Per triage protocol, message will be closed.   

## 2019-07-17 ENCOUNTER — Telehealth: Payer: Self-pay | Admitting: Internal Medicine

## 2019-07-17 NOTE — Telephone Encounter (Signed)
LMTCB

## 2019-07-18 NOTE — Telephone Encounter (Signed)
Suggest he turn up the humidifier on his CPAP machine. He should still have a booklet for his CPAP machine that will tell him how to do this. If he needs help, he can call his DME company.  He can also try using otc nasal saline gel. This is otc at drug store and pharmacy there can help him find it. A little in each nostril at bedtime can help with dry nose.

## 2019-07-18 NOTE — Telephone Encounter (Signed)
Spoke with pt. States that he was needing CPAP supplies but this has been taken care of. Reports that he has been waking up every morning with sinus congestion and a dry throat. He feels that this is coming from his CPAP. Pt takes OTC medications and this helps with his congestion but it comes right back after using his CPAP. He would like CY's recommendations.  CY - please advise. Thanks!

## 2019-07-18 NOTE — Telephone Encounter (Signed)
Called and spoke to pt. Informed him of the recs per CY. Pt states he has already tried turning up his humidification and then a lot of water is in tube and pt thinks something is wrong and he has to turn the humidification back down. Advised pt to contact his DME ([facility]) to check it out. Pt states he has tried several times but no one is contacting him back. Called [employer] at [phone] and left a VM. Will await call back.

## 2019-07-19 NOTE — Telephone Encounter (Signed)
ATC to follow with [employer]. The line just continued to ring with no one coming to the line. LMTCB x2 for [employer].

## 2019-07-20 NOTE — Telephone Encounter (Signed)
ATC to follow with [employer]. The line just continued to ring with no one coming to the line. LMTCB x3 for [employer].   LMTCB x1 for pt to make him aware that we have not been able to get in touch with [employer].

## 2019-07-25 NOTE — Telephone Encounter (Signed)
LMTCB.   Please make an appt with app for a virutal visit to discuss further.

## 2019-07-26 NOTE — Telephone Encounter (Signed)
I called and spoke with the patient and he refused a virtual visit because he said the situation will not be resolved by talking on the phone. He sates that he was given the wrong supplies and he will try to contact [employer] today. I advised him that we have tried to contact them as well and looks to be unsuccessful. I advised him to call us back if he gets in contact with him.

## 2019-07-31 ENCOUNTER — Other Ambulatory Visit: Payer: Self-pay

## 2019-07-31 ENCOUNTER — Telehealth: Payer: Self-pay | Admitting: Gastroenterology

## 2019-07-31 MED ORDER — DEXILANT 60 MG PO CPDR: 60.0000 mg | DELAYED_RELEASE_CAPSULE | Freq: Every day | ORAL | 1 refills | Status: DC

## 2019-07-31 NOTE — Progress Notes (Signed)
Refill sent to pharmacy.   

## 2019-08-14 ENCOUNTER — Other Ambulatory Visit: Payer: Self-pay | Admitting: Neurosurgery

## 2019-08-15 ENCOUNTER — Other Ambulatory Visit: Payer: Self-pay | Admitting: Neurosurgery

## 2019-08-15 DIAGNOSIS — M546 Pain in thoracic spine: Secondary | ICD-10-CM

## 2019-08-15 DIAGNOSIS — M542 Cervicalgia: Secondary | ICD-10-CM

## 2019-08-18 ENCOUNTER — Telehealth: Payer: Self-pay | Admitting: Internal Medicine

## 2019-08-18 NOTE — Telephone Encounter (Signed)
Called and spoke to patient. Patient stated that he received a letter from [facility] stating that a new CPAP machine is declined. Patient reported that he currently has a Materials engineer from [employer] and was told by [facility] that he is eligible for a new CPAP in 05/2020.  Patient wanted to make Dr. Annamaria Boots aware incase there is any issue.  Will route to CY as FYI.   Called and spoke to [place] at [employer]. Patient currently has loaner and will continue to use that.  [facility] has patient's old CPAP and are sending it off to be fixed but patient is able to keep the current loaner until issues with old CPAP are resolved or until he qualifies for a replacement.   Called and gave patient the information from [place]. Nothing further needed at this time.

## 2019-08-21 ENCOUNTER — Ambulatory Visit: Payer: Medicaid Other | Attending: Internal Medicine

## 2019-08-21 DIAGNOSIS — Z20822 Contact with and (suspected) exposure to covid-19: Secondary | ICD-10-CM

## 2019-08-22 ENCOUNTER — Ambulatory Visit
Admission: RE | Admit: 2019-08-22 | Discharge: 2019-08-22 | Disposition: A | Discharge status: Home / Self Care | Payer: Medicaid Other | Source: Ambulatory Visit | Attending: Neurosurgery | Admitting: Neurosurgery

## 2019-08-22 ENCOUNTER — Other Ambulatory Visit: Payer: Medicaid Other

## 2019-08-22 DIAGNOSIS — M542 Cervicalgia: Secondary | ICD-10-CM

## 2019-08-22 DIAGNOSIS — M546 Pain in thoracic spine: Secondary | ICD-10-CM

## 2019-08-22 LAB — NOVEL CORONAVIRUS, NAA: SARS-CoV-2, NAA: NOT DETECTED

## 2019-09-13 ENCOUNTER — Ambulatory Visit: Payer: Medicaid Other | Admitting: Internal Medicine

## 2019-10-03 ENCOUNTER — Other Ambulatory Visit: Payer: Self-pay | Admitting: Internal Medicine

## 2019-10-03 NOTE — Telephone Encounter (Signed)
Prescription refill request for Eliquis received.  Last office visit: 9/8/2020Caryl [name] Scr: 0.95, 05/09/2019 Age: 59 y.o. Weight: 129 kg   Prescription refill sent.

## 2019-10-27 ENCOUNTER — Telehealth: Payer: Self-pay | Admitting: Internal Medicine

## 2019-10-27 DIAGNOSIS — G4733 Obstructive sleep apnea (adult) (pediatric): Secondary | ICD-10-CM

## 2019-10-27 NOTE — Telephone Encounter (Signed)
Spoke with the pt  He is asking for new order for CPAP supplies to go to [employer] sent  Nothing further needed

## 2019-10-30 ENCOUNTER — Telehealth: Payer: Self-pay | Admitting: Gastroenterology

## 2019-10-30 ENCOUNTER — Telehealth: Payer: Self-pay | Admitting: Internal Medicine

## 2019-10-30 NOTE — Telephone Encounter (Signed)
Dr Annamaria Boots patient states he is not eligible for new cpap until Sep/Oct of this year. [facility] told him it's going to cost $200 to fix old machine. He say he can't afford to have cpap fixed. Please advise if any other resources pt does have [place] Medicaid.

## 2019-10-30 NOTE — Telephone Encounter (Signed)
[facility] order was place last July for new machine which was denied. When will patient be eligible for new CPAP. [id number]

## 2019-10-30 NOTE — Telephone Encounter (Signed)
Pt returning a phone call. Pt can be reached at [phone].

## 2019-10-30 NOTE — Telephone Encounter (Signed)
Prior Auth request sent to [place] at fax: [id number] for Dexilant 60mg  once daily.  Patient has tried and failed omeprazole 40mg  bid and pantoprazole 40mg  bid. [place] Call [place]

## 2019-10-30 NOTE — Telephone Encounter (Signed)
Left message for patient to call back with more detail. An order was place on 10/27/19 for new supplies. How old is cpap?   Eileen Stanford, RN     08/18/19 2:31 PM Note   Called and spoke to patient. Patient stated that he received a letter from [facility] stating that a new CPAP machine is declined. Patient reported that he currently has a Materials engineer from [employer] and was told by [facility] that he is eligible for a new CPAP in 05/2020.  Patient wanted to make Dr. Annamaria Boots aware incase there is any issue.  Will route to CY as FYI.   Called and spoke to [place] at [employer]. Patient currently has loaner and will continue to use that.  [facility] has patient's old CPAP and are sending it off to be fixed but patient is able to keep the current loaner until issues with old CPAP are resolved or until he qualifies for a replacement.   Called and gave patient the information from [place]. Nothing further needed at this time.

## 2019-10-31 NOTE — Telephone Encounter (Signed)
Patient is calling to follow up on auth sent states he is in a lot of pain.

## 2019-10-31 NOTE — Telephone Encounter (Signed)
Called and spoke to pt.  He is completely out of Dexilant.  I informed him that I will leave 2 boxes of samples up front for him to pick up to tide him over until we hear back from [place] tracks on his PA.  Patient indicated he will pick them up and was appreciative.

## 2019-10-31 NOTE — Telephone Encounter (Signed)
It looks as though Janett Billow has it taken care of. I ATC Ashley back and did not get an answer due to them being at lunch. I left a message to call back if anything further is needed.

## 2019-10-31 NOTE — Telephone Encounter (Signed)
Called tried to leave a message for Jeneen Rinks with [employer], but the mailbox was full.  Called the main number [phone], had to leave a message with patient details and reason for the call. Requested call back to confirm the message has been received.  [facility] will need to call back to confirm they will be able to assist the patient.

## 2019-10-31 NOTE — Telephone Encounter (Signed)
Please discuss with [facility]. Is there anyway they could give him a loaner or rental until they can replace his old machine?

## 2019-10-31 NOTE — Telephone Encounter (Signed)
Dr. Annamaria Boots, the information below was sent to you as an FYI:  I called back [employer] and talked with Janett Billow. She stated the patient was given loaner CPAP machine back in December 2020. The repair costs for his CPAP machine is $230 which the patient stated he cannot afford. Janett Billow said her supervisor was trying to get the patient a new cpap machine, but are not guaranteed the insurance will cover it.  I told Janett Billow that if there are any orders on our end that are needed, to please let us know.  I called the patient to let him know that [facility] was working on trying to get him a replacement. The patient stated he does not qualify for a new machine until November. I told him [facility] is doing what they can to get him the machine sooner, but obviously continue using the loaner for now. Patient voiced understanding.  Nothing further needed at this time.

## 2019-10-31 NOTE — Telephone Encounter (Signed)
Jessica from [employer] returning phone call.  Janett Billow phone number is [phone].

## 2019-10-31 NOTE — Telephone Encounter (Signed)
Return call from [place] [phone].[name]'

## 2019-10-31 NOTE — Telephone Encounter (Signed)
Closing encounter

## 2019-11-09 ENCOUNTER — Telehealth: Payer: Self-pay | Admitting: Internal Medicine

## 2019-11-09 NOTE — Telephone Encounter (Signed)
Spoke with patient. He stated that he has not heard anything from [facility] in regards to his CPAP supplies. Reviewed his chart and saw that the order was placed on 10/27/19. Advised him that someone from our office will call [facility] in the morning since it is after 5pm and their office is closed. He verbalized understanding.   Will leave in triage for follow up tomorrow morning with [facility].

## 2019-11-10 NOTE — Telephone Encounter (Signed)
I called [facility] but there was no answer. LM for them to call back.

## 2019-11-13 NOTE — Telephone Encounter (Signed)
ATC [facility], received a message stating that I was calling after normal business hours. Will send message to triage for follow up.

## 2019-11-13 NOTE — Telephone Encounter (Signed)
PA Approval [id number] Effective 10-30-2019 thru 10-24-2020.

## 2019-11-13 NOTE — Telephone Encounter (Signed)
Called [facility] and spoke with Janett Billow checking status of pt's cpap supplies. Per Janett Billow, order was sent out 3/12 and they should be delivered to pt 3/16. Janett Billow stated that pt was called and a VM was left for pt in regards to this.  Attempted to call pt to let him know this but unable to reach. Left message for pt to return call.

## 2019-11-13 NOTE — Telephone Encounter (Signed)
PA for Dexilant approved. Patient picked up a 90 day supply on 3-5 and paid $3.00.

## 2019-11-14 NOTE — Telephone Encounter (Signed)
ATC patient.  LM for Patient to call back. 

## 2019-11-15 NOTE — Telephone Encounter (Signed)
Spoke with pt. States that he received his CPAP supplies yesterday. Nothing further was needed.

## 2019-12-01 ENCOUNTER — Other Ambulatory Visit: Payer: Self-pay | Admitting: Internal Medicine

## 2019-12-01 ENCOUNTER — Other Ambulatory Visit: Payer: Self-pay | Admitting: Gastroenterology

## 2019-12-01 ENCOUNTER — Telehealth: Payer: Self-pay | Admitting: Internal Medicine

## 2019-12-01 NOTE — Telephone Encounter (Signed)
Spoke with pt who states he needs refills of Tikosyn and Carvedilol.  Pt states pharmacy has sent request.  Pt advised Tikosyn refill has been approved and sent back to pharmacy.  Do not see that Carvedilol refill has been authorized yet.  Pt advised to check with pharmacy this afternoon re: refills.

## 2019-12-01 NOTE — Telephone Encounter (Signed)
New Message  Patient would like for a nurse to give him a call regarding getting authorization for filling his medication. Need medication asap  Call to discuss

## 2019-12-05 ENCOUNTER — Other Ambulatory Visit: Payer: Self-pay | Admitting: Gastroenterology

## 2019-12-11 ENCOUNTER — Ambulatory Visit (INDEPENDENT_AMBULATORY_CARE_PROVIDER_SITE_OTHER): Payer: Medicaid Other | Admitting: *Deleted

## 2019-12-11 DIAGNOSIS — I480 Paroxysmal atrial fibrillation: Secondary | ICD-10-CM

## 2019-12-11 LAB — CUP PACEART REMOTE DEVICE CHECK
Battery Impedance: 1128 Ohm
Battery Remaining Longevity: 79 mo
Battery Voltage: 2.78 V
Brady Statistic RA Percent Paced: 4 %
Date Time Interrogation Session: 20210412111834
Implantable Lead Implant Date: 20091023
Implantable Lead Location: 753859
Implantable Lead Model: 5076
Implantable Pulse Generator Implant Date: 20091023
Lead Channel Impedance Value: 457 Ohm
Lead Channel Impedance Value: 67 Ohm
Lead Channel Setting Pacing Amplitude: 2 V

## 2019-12-12 NOTE — Progress Notes (Signed)
PPM Remote  

## 2019-12-18 ENCOUNTER — Other Ambulatory Visit: Payer: Self-pay | Admitting: Internal Medicine

## 2019-12-18 ENCOUNTER — Other Ambulatory Visit: Payer: Self-pay | Admitting: Gastroenterology

## 2019-12-20 ENCOUNTER — Telehealth: Payer: Self-pay | Admitting: *Deleted

## 2019-12-20 ENCOUNTER — Other Ambulatory Visit: Payer: Self-pay | Admitting: Neurosurgery

## 2019-12-20 NOTE — Telephone Encounter (Signed)
Primary Cardiologist:James Allred, MD  Chart reviewed as part of pre-operative protocol coverage. Because of [name]'s past medical history and time since last visit, he/she will require a follow-up visit in order to better assess preoperative cardiovascular risk.  Pre-op covering staff: - Please schedule appointment and call patient to inform them. - Please contact requesting surgeon's office via preferred method (i.e, phone, fax) to inform them of need for appointment prior to surgery.  If applicable, this message will also be routed to pharmacy pool and/or primary cardiologist for input on holding anticoagulant/antiplatelet agent as requested below so that this information is available at time of patient's appointment.   Deberah Pelton, NP  12/20/2019, 4:29 PM

## 2019-12-20 NOTE — Telephone Encounter (Signed)
I will send a message to EP scheduler Ashland E. To reach out to the pt with an appt for pre op with Dr. Rayann Heman or EP APP.

## 2019-12-20 NOTE — Telephone Encounter (Signed)
   [facility] Pre-operative Risk Assessment    Request for surgical clearance:  1. What type of surgery is being performed? C7-T1 ANTERIOR CERVICAL FUSION   2. When is this surgery scheduled? 01/01/20   3. What type of clearance is required (medical clearance vs. Pharmacy clearance to hold med vs. Both)? BOTH  4. Are there any medications that need to be held prior to surgery and how long? ELIQUIS   5. Practice name and name of physician performing surgery? [facility]; DR. Mallie Mussel POOL   6. What is your office phone number [phone]    7.   What is your office fax number [phone] ATTN; VANESSA X [phone]  8.   Anesthesia type (None, local, MAC, general) ? GENERAL   Julaine Hua 12/20/2019, 4:18 PM  _________________________________________________________________   (provider comments below)

## 2019-12-21 NOTE — Telephone Encounter (Signed)
Patient needs office visit.  

## 2019-12-21 NOTE — Telephone Encounter (Signed)
Pt has appt with Oda Kilts, [facility] 12/26/19 for pre op clearance. I will forward clearance notes to [facility] for upcoming appt. I will send an FYI to the surgeon Dr. Earnie Larsson pt has upcoming appt for pre op clearance. I will remove from the pre op call back pool.

## 2019-12-21 NOTE — Telephone Encounter (Addendum)
Pt takes Eliquis for afib with CHADS2VASc score of 3 (CHF, HTN, CAD), also has history of PE in 2004. Renal function is normal.  Pt last seen in Sept 2020 - plan was for potential cardioversion or ablation. Ok to hold Eliquis for 3 days prior to spinal procedure as long as cardioversion or ablation are not scheduled urgently after he sees EP since he would require 3 weeks of uninterrupted anticoagulation before either.

## 2019-12-26 ENCOUNTER — Other Ambulatory Visit: Payer: Self-pay

## 2019-12-26 ENCOUNTER — Encounter: Payer: Self-pay | Admitting: Student

## 2019-12-26 ENCOUNTER — Ambulatory Visit: Payer: Medicaid Other | Admitting: Student

## 2019-12-26 VITALS — BP 126/84 | HR 65 | Ht 71.0 in | Wt 284.4 lb

## 2019-12-26 DIAGNOSIS — I5042 Chronic combined systolic (congestive) and diastolic (congestive) heart failure: Secondary | ICD-10-CM | POA: Diagnosis not present

## 2019-12-26 DIAGNOSIS — I480 Paroxysmal atrial fibrillation: Secondary | ICD-10-CM | POA: Diagnosis not present

## 2019-12-26 DIAGNOSIS — Z950 Presence of cardiac pacemaker: Secondary | ICD-10-CM | POA: Diagnosis not present

## 2019-12-26 DIAGNOSIS — Z79899 Other long term (current) drug therapy: Secondary | ICD-10-CM | POA: Diagnosis not present

## 2019-12-26 DIAGNOSIS — G4733 Obstructive sleep apnea (adult) (pediatric): Secondary | ICD-10-CM

## 2019-12-26 LAB — COMPREHENSIVE METABOLIC PANEL
ALT: 23 IU/L (ref 0–44)
AST: 22 IU/L (ref 0–40)
Albumin/Globulin Ratio: 1.8 (ref 1.2–2.2)
Albumin: 4.2 g/dL (ref 3.8–4.9)
Alkaline Phosphatase: 105 IU/L (ref 39–117)
BUN/Creatinine Ratio: 14 (ref 10–24)
BUN: 16 mg/dL (ref 8–27)
Bilirubin Total: 0.5 mg/dL (ref 0.0–1.2)
CO2: 24 mmol/L (ref 20–29)
Calcium: 9.7 mg/dL (ref 8.6–10.2)
Chloride: 104 mmol/L (ref 96–106)
Creatinine, Ser: 1.11 mg/dL (ref 0.76–1.27)
GFR calc Af Amer: 83 mL/min/{1.73_m2} (ref >59)
GFR calc non Af Amer: 72 mL/min/{1.73_m2} (ref >59)
Globulin, Total: 2.3 g/dL (ref 1.5–4.5)
Glucose: 101 mg/dL — ABNORMAL HIGH (ref 65–99)
Potassium: 4.2 mmol/L (ref 3.5–5.2)
Sodium: 139 mmol/L (ref 134–144)
Total Protein: 6.5 g/dL (ref 6.0–8.5)

## 2019-12-26 LAB — CUP PACEART INCLINIC DEVICE CHECK
Battery Impedance: 1153 Ohm
Battery Remaining Longevity: 78 mo
Battery Voltage: 2.77 V
Brady Statistic RA Percent Paced: 4 %
Date Time Interrogation Session: 20210427094739
Implantable Lead Implant Date: 20091023
Implantable Lead Location: 753859
Implantable Lead Model: 5076
Implantable Pulse Generator Implant Date: 20091023
Lead Channel Impedance Value: 453 Ohm
Lead Channel Impedance Value: 67 Ohm
Lead Channel Pacing Threshold Amplitude: 0.5 V
Lead Channel Pacing Threshold Pulse Width: 0.4 ms
Lead Channel Sensing Intrinsic Amplitude: 2 mV
Lead Channel Setting Pacing Amplitude: 2 V

## 2019-12-26 LAB — CBC
Hematocrit: 44 % (ref 37.5–51.0)
Hemoglobin: 14.3 g/dL (ref 13.0–17.7)
MCH: 26.2 pg — ABNORMAL LOW (ref 26.6–33.0)
MCHC: 32.5 g/dL (ref 31.5–35.7)
MCV: 81 fL (ref 79–97)
Platelets: 252 10*3/uL (ref 150–450)
RBC: 5.46 x10E6/uL (ref 4.14–5.80)
RDW: 16 % — ABNORMAL HIGH (ref 11.6–15.4)
WBC: 4.7 10*3/uL (ref 3.4–10.8)

## 2019-12-26 LAB — MAGNESIUM: Magnesium: 2.1 mg/dL (ref 1.6–2.3)

## 2019-12-26 NOTE — Progress Notes (Signed)
[id number] [address], [place] [address] Phone: [phone] Fax: [phone]      Your procedure is scheduled on Monday, Jan 01, 2020.  Report to [facility] Main Entrance "A" at 6:00 A.M., and check in at the Admitting office.  Call this number if you have problems the morning of surgery:  [phone]  Call [phone] if you have any questions prior to your surgery date Monday-Friday 8am-4pm    Remember:  Do not eat or drink after midnight the night before your surgery    Take these medicines the morning of surgery with A SIP OF WATER:  atorvastatin (LIPITOR) carvedilol (COREG)  dofetilide (TIKOSYN)  gabapentin (NEURONTIN) methimazole (TAPAZOLE) sucralfate (CARAFATE)  If needed: oxyCODONE-acetaminophen (PERCOCET)  Follow your surgeon's instructions on when to stop Eliquis.  If no instructions were given by your surgeon then you will need to call the office to get those instructions.     As of today, STOP taking any Aspirin (unless otherwise instructed by your surgeon) and Aspirin containing products, Aleve, Naproxen, Ibuprofen, Motrin, Advil, Goody's, BC's, all herbal medications, fish oil, and all vitamins.                      Do not wear jewelry.            Do not wear lotions, powders, colognes, or deodorant.            Men may shave face and neck.            Do not bring valuables to the hospital.            [facility] is not responsible for any belongings or valuables.  Do NOT Smoke (Tobacco/Vapping) or drink Alcohol 24 hours prior to your procedure If you use a CPAP at night, you may bring all equipment for your overnight stay.   Contacts, glasses, dentures or bridgework may not be worn into surgery.      For patients admitted to the hospital, discharge time will be determined by your treatment team.   Patients discharged the day of surgery will not be allowed to drive home, and someone  needs to stay with them for 24 hours.    Special instructions:   [facility]- Preparing For Surgery  Before surgery, you can play an important role. Because skin is not sterile, your skin needs to be as free of germs as possible. You can reduce the number of germs on your skin by washing with CHG (chlorahexidine gluconate) Soap before surgery.  CHG is an antiseptic cleaner which kills germs and bonds with the skin to continue killing germs even after washing.    Oral Hygiene is also important to reduce your risk of infection.  Remember - BRUSH YOUR TEETH THE MORNING OF SURGERY WITH YOUR REGULAR TOOTHPASTE  Please do not use if you have an allergy to CHG or antibacterial soaps. If your skin becomes reddened/irritated stop using the CHG.  Do not shave (including legs and underarms) for at least 48 hours prior to first CHG shower. It is OK to shave your face.  Please follow these instructions carefully.   1. Shower the NIGHT BEFORE SURGERY and the MORNING OF SURGERY with CHG Soap.   2. If you chose to wash your hair, wash your hair first as usual with your normal shampoo.  3. After you shampoo, rinse your hair and body thoroughly to remove the  shampoo.  4. Use CHG as you would any other liquid soap. You can apply CHG directly to the skin and wash gently with a scrungie or a clean washcloth.   5. Apply the CHG Soap to your body ONLY FROM THE NECK DOWN.  Do not use on open wounds or open sores. Avoid contact with your eyes, ears, mouth and genitals (private parts). Wash Face and genitals (private parts)  with your normal soap.   6. Wash thoroughly, paying special attention to the area where your surgery will be performed.  7. Thoroughly rinse your body with warm water from the neck down.  8. DO NOT shower/wash with your normal soap after using and rinsing off the CHG Soap.  9. Pat yourself dry with a CLEAN TOWEL.  10. Wear CLEAN PAJAMAS to bed the night before surgery, wear comfortable  clothes the morning of surgery  11. Place CLEAN SHEETS on your bed the night of your first shower and DO NOT SLEEP WITH PETS.   Day of Surgery:   Do not apply any deodorants/lotions.  Please wear clean clothes to the hospital/surgery center.   Remember to brush your teeth WITH YOUR REGULAR TOOTHPASTE.   Please read over the following fact sheets that you were given.

## 2019-12-26 NOTE — Progress Notes (Signed)
Electrophysiology Office Note Date: 12/26/2019  ID:  [name], DOB [birth year], MRN [id number]  PCP: [facility], [name] Primary Cardiologist: [name] Grayer, MD  CC: Pacemaker follow-up  [name] is a 59 y.o. male seen today for Dr. Rayann [name] for cardiac clearance.  Since last being seen in our clinic the patient reports doing well. He has been having upper back and upper should/chest pain that is thought to be secondary to nerve issues in his neck. He is scheduled next week for fusion of C7-T1. He sometimes has tachypalpitations with moderate exertion, but this is at baseline. He denies any undue SOB. He denies exertional chest pain, dyspnea, PND, orthopnea, nausea, vomiting, dizziness, syncope, edema, weight gain, or early satiety.  Device History: Medtronic Dual Chamber PPM implanted 05/2008 for sinus node dysfunction  Past Medical History:  Diagnosis Date  . Atrial fibrillation ([place])    s/p afib ablation  PVI 5/12 JA  . CAD -nonobstructive    S/P nstemi-type II cath 05/2008 - nonobs. dzs.  . Cataract    left eye 2019  . DDD (degenerative disc disease)    evaluate by neurosurgery is ongoing  . Depression    ptsd  . Erectile dysfunction   . Headache(784.0)   . Hemorrhoids   . HTN (hypertension)   . Hyperlipemia   . Hyperthyroidism    Graves dz on methimazole and followed by [name]  . Myocardial infarction ([place])    2009  . Obesity   . Pancreatitis   . Polysubstance abuse ([place])    cocaine, last used 1999  . PUD (peptic ulcer disease)    gastritis due to ETOH previously  . Pulmonary embolism ([place]) 04/2003   Hx of  . Rectal bleeding   . Sinus node dysfunction ([facility])    s/p MDT PPM - AAI - V port plugged  . Sleep apnea    uses CPAP [facility] 2 YRS  . Systolic and diastolic CHF, chronic ([place])    EF 35% by most recent echo 09/19/10  . 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 ANTERIOR CERVICAL DECOMP/DISCECTOMY FUSION  12/15/2011   Procedure: ANTERIOR CERVICAL DECOMPRESSION/DISCECTOMY FUSION 1 LEVEL;  Surgeon: [name] Pitter, MD;  Location: [facility];  Service: Neurosurgery;  Laterality: N/A;  Cervical Six-Seven Anterior Cervical Diskectomy fusion with Allograft and Plating  . ATRIAL ABLATION SURGERY  12/2010   afib ablation by Dr [name]  . ATRIAL FIBRILLATION ABLATION N/A 05/06/2017   Procedure: Atrial Fibrillation Ablation;  Surgeon: [name] Grayer, MD;  Location: [facility];  Service: Cardiovascular;  Laterality: N/A;  . BACK SURGERY    . CARDIAC CATHETERIZATION     CADIAC ABLATION DR [name] 12/2010  . CARDIAC CATHETERIZATION N/A 05/08/2016   Procedure: Left Heart Cath and Coronary Angiography;  Surgeon: [name] M Martinique, MD;  Location: [facility];  Service: Cardiovascular;  Laterality: N/A;  . Fingers removed from right hand.  2/84   traumatic work injury  . INSERT / REPLACE / REMOVE PACEMAKER     05/2008    . LUMBAR SPINE SURGERY  02/2009   Dr. Trenton Gammon  . PACEMAKER INSERTION  10/09   by Dr [name]  . TEE WITHOUT CARDIOVERSION N/A 05/05/2017   Procedure: TRANSESOPHAGEAL ECHOCARDIOGRAM (TEE);  Surgeon: [name] Dresser, MD;  Location: [facility];  Service: Cardiovascular;  Laterality: N/A;  Current Outpatient Medications  Medication Sig Dispense Refill  . atorvastatin (LIPITOR) 40 MG tablet Take 40 mg by mouth daily.    . carvedilol (COREG) 25 MG tablet TAKE 1 TABLET (25 MG TOTAL) BY MOUTH 2 (TWO) TIMES DAILY WITH A MEAL. 180 tablet 2  . cholecalciferol (VITAMIN D3) 25 MCG (1000 UNIT) tablet Take 1,000 Units by mouth daily.    . cyanocobalamin (,VITAMIN B-12,) 1000 MCG/ML injection Inject 1,000 mcg into the muscle See admin instructions. Start with 99m intramuscular each day for 7 consecutive days, then 154mintramuscular each week for 4 weeks, then 4m2mntramuscular each     . desonide (DESOWEN) 0.05 % cream Apply 1 application topically 2 (two)  times daily as needed (for rash).     . DEXILANT 60 MG capsule TAKE 1 CAPSULE BY MOUTH EVERY DAY 90 capsule 1  . dofetilide (TIKOSYN) 500 MCG capsule TAKE 1 CAPSULE (500 MCG TOTAL) BY MOUTH 2 (TWO) TIMES DAILY. 180 capsule 1  . ELIQUIS 5 MG TABS tablet TAKE 1 TABLET BY MOUTH TWICE A DAY 60 tablet 5  . gabapentin (NEURONTIN) 100 MG capsule Take 100-200 mg by mouth See admin instructions. Patient takes 1 tablet in the morning and 2 tablets at night     . ibuprofen (ADVIL,MOTRIN) 200 MG tablet Take 800 mg by mouth every 6 (six) hours as needed for headache (back pain).    . methimazole (TAPAZOLE) 10 MG tablet Take 1 tablet (10 mg total) by mouth daily. 30 tablet 1  . oxyCODONE-acetaminophen (PERCOCET) 10-325 MG per tablet Take 1 tablet by mouth every 4 (four) hours as needed for pain. 29 tablet 0  . sucralfate (CARAFATE) 1 GM/10ML suspension Take 10 mLs (1 g total) by mouth 4 (four) times daily. 420 mL 1  . triamcinolone ointment (KENALOG) 0.1 % Apply 1 application topically 2 (two) times daily as needed (for rash).   3   No current facility-administered medications for this visit.    Allergies:   Celecoxib, Hydrocodone, Prednisolone, and Potassium chloride   Social History: Social History   Socioeconomic History  . Marital status: Legally Separated    Spouse name: Not on file  . Number of children: 7  . Years of education: Not on file  . Highest education level: Not on file  Occupational History  . Occupation: unemployed    EmpFish farm managerNEMPLOYED  Tobacco Use  . Smoking status: Former Smoker    Packs/day: 1.00    Years: 15.00    Pack years: 15.00    Types: Cigarettes    Quit date: 04/04/2003    Years since quitting: 16.7  . Smokeless tobacco: Never Used  Substance and Sexual Activity  . Alcohol use: No    Comment: formerly heavy ETOH,  no alcohol in 20 years  . Drug use: No    Comment: previously used crack cocaine, quit 1999,  + marijuana  . Sexual activity: Yes    Birth  control/protection: Condom  Other Topics Concern  . Not on file  Social History Narrative   Unemployed currently.  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 Difficulty of Paying Living Expenses:   Food Insecurity:   . Worried About RunCharity fundraiser the Last Year:   . RanArboriculturist the Last Year:   Transportation Needs:   . LacFilm/video editoredical):   . LMarland Kitchenck of Transportation (  Non-Medical):   Physical Activity:   . Days of Exercise per Week:   . Minutes of Exercise per Session:   Stress:   . Feeling of Stress :   Social Connections:   . Frequency of Communication with Friends and Family:   . Frequency of Social Gatherings with Friends and Family:   . Attends Religious Services:   . Active Member of Clubs or Organizations:   . Attends Archivist Meetings:   [name] Marital Status:   Intimate Partner Violence:   . Fear of Current or Ex-Partner:   . Emotionally Abused:   [name] Physically Abused:   . Sexually Abused:     Family History: Family History  Problem Relation Age of Onset  . Heart disease Mother   . Breast cancer Mother   . Lung cancer Father   . Diabetes Other   . Colon cancer Neg Hx   . Esophageal cancer Neg Hx   . Rectal cancer Neg Hx   . Stomach cancer Neg Hx      Review of Systems: All other systems reviewed and are otherwise negative except as noted above.  Physical Exam: Vitals:   12/26/19 0902  BP: 126/84  Pulse: 65  SpO2: 92%  Weight: 284 lb 6.4 oz (129 kg)  Height: 5' 11"  (1.803 m)     GEN- The patient is well appearing, alert and oriented x 3 today.   HEENT: normocephalic, atraumatic; sclera clear, conjunctiva pink; hearing intact; oropharynx clear; neck supple  Lungs- Clear to ausculation bilaterally, normal work of breathing.  No wheezes, rales, rhonchi Heart- Regular rate and rhythm, no murmurs, rubs or gallops  GI- soft, non-tender,  non-distended, bowel sounds present  Extremities- no clubbing, cyanosis, or edema  MS- no significant deformity or atrophy Skin- warm and dry, no rash or lesion; PPM pocket well healed Psych- euthymic mood, full affect Neuro- strength and sensation are intact  PPM Interrogation- reviewed in detail today,  See PACEART report  EKG:  EKG is ordered today. The ekg ordered today shows NSR at 65 bpm, QTc stable at 424 ms on tikosyn  Recent Labs: 05/09/2019: BUN 18; Creatinine, Ser 0.95; Hemoglobin 13.5; Magnesium 2.0; Platelets 253; Potassium 3.6; Sodium 141; TSH 2.110   Wt Readings from Last 3 Encounters:  12/26/19 284 lb 6.4 oz (129 kg)  06/14/19 284 lb (128.8 kg)  05/18/19 284 lb 6.4 oz (129 kg)     Other studies Reviewed: Additional studies/ records that were reviewed today include: Previous EP office notes, Previous remote checks, Most recent labwork.   Assessment and Plan:  1. SND s/p Medtronic PPM  Normal PPM function See Pace Art report No changes today  2. Persistent atrial fibrillation NSR today.  Occasional runs of SVT on device.  No bleeding on Eliquis. Continue for CHA2DS2VASC of at least 5   OK to hold for 3 days prior to surgery as directed by pharmacy for clearance CBC today  3. Tikosyn, high risk medication surveillance EKG today shows NSR and stable QTc CMET and Mg today.   4. CAD, ischemic cardiomyopathy No recent ischemic symptoms. No further cardiac work up warranted at this point prior to surgery.   5. Cardiac clearance For C7-T1 anterior cervical fusion 01/01/2020 EF 35-40% by TEE 05/2017 LHC 05/2016 with non-obstructive CAD He is cleared for surgery from a cardiac perspective with AT LEAST moderate risk (6.6% estimated rate of MI, pulmonary edema, VF, or cardiac arrest perioperatively) by Revised Cardiac Risk Index Truman Hayward Criteria)  Will need device clearance/instructions as well due to proximity to device.   Current medicines are reviewed at length with  the patient today.   The patient does not have concerns regarding his medicines.  The following changes were made today:  none  Labs/ tests ordered today include:  Orders Placed This Encounter  Procedures  . Comp Met (CMET)  . CBC  . Magnesium  . CUP PACEART INCLINIC DEVICE CHECK    Disposition:   Follow up with Dr. Rayann [name] in 6 months. Sooner with symptoms.     Jacalyn Lefevre, PA-C  12/26/2019 9:54 AM  [facility] [address] [place] [address] [phone] (office) [phone] (fax)

## 2019-12-26 NOTE — Patient Instructions (Addendum)
Medication Instructions:  none *If you need a refill on your cardiac medications before your next appointment, please call your pharmacy*   Lab Work:  TODAY CBC CMET MAGNESIUM If you have labs (blood work) drawn today and your tests are completely normal, you will receive your results only by: [name] MyChart Message (if you have MyChart) OR . A paper copy in the mail If you have any lab test that is abnormal or we need to change your treatment, we will call you to review the results.   Testing/Procedures: none   Follow-Up: At [facility], you and your health needs are our priority.  As part of our continuing mission to provide you with exceptional heart care, we have created designated Provider Care Teams.  These Care Teams include your primary Cardiologist (physician) and Advanced Practice Providers (APPs -  Physician Assistants and Nurse Practitioners) who all work together to provide you with the care you need, when you need it.  We recommend signing up for the patient portal called "MyChart".  Sign up information is provided on this After Visit Summary.  MyChart is used to connect with patients for Virtual Visits (Telemedicine).  Patients are able to view lab/test results, encounter notes, upcoming appointments, etc.  Non-urgent messages can be sent to your provider as well.   To learn more about what you can do with MyChart, go to [email or web address].    Your next appointment:   6 months The format for your next appointment:   Either In Person or Virtual  Provider:   [name]   Other Instructions Remote monitoring is used to monitor your Pacemaker from home. This monitoring reduces the number of office visits required to check your device to one time per year. It allows [name] to keep an eye on the functioning of your device to ensure it is working properly. You are scheduled for a device check from home on 03/11/20. You may send your transmission at any time that day. If you  have a wireless device, the transmission will be sent automatically. After your physician reviews your transmission, you will receive a postcard with your next transmission date.

## 2019-12-26 NOTE — Progress Notes (Signed)
Pt has Medtronic Pacemaker. Emailed device clinic for orders for upcoming procedure on 01/01/2020. Emailed Dannial Monarch - Device rep. Jovita Kussmaul, RN cc'd.

## 2019-12-27 ENCOUNTER — Encounter (HOSPITAL_COMMUNITY)
Admission: RE | Admit: 2019-12-27 | Discharge: 2019-12-27 | Disposition: A | Discharge status: Home / Self Care | Payer: Medicaid Other | Source: Ambulatory Visit | Attending: Neurosurgery | Admitting: Neurosurgery

## 2019-12-27 ENCOUNTER — Other Ambulatory Visit: Payer: Self-pay

## 2019-12-27 ENCOUNTER — Encounter (HOSPITAL_COMMUNITY): Payer: Self-pay

## 2019-12-27 ENCOUNTER — Encounter: Payer: Self-pay | Admitting: Internal Medicine

## 2019-12-27 DIAGNOSIS — Z01812 Encounter for preprocedural laboratory examination: Secondary | ICD-10-CM | POA: Diagnosis not present

## 2019-12-27 HISTORY — DX: Presence of cardiac pacemaker: Z95.0

## 2019-12-27 LAB — BASIC METABOLIC PANEL
Anion gap: 9 (ref 5–15)
BUN: 14 mg/dL (ref 6–20)
CO2: 24 mmol/L (ref 22–32)
Calcium: 9.3 mg/dL (ref 8.9–10.3)
Chloride: 108 mmol/L (ref 98–111)
Creatinine, Ser: 1 mg/dL (ref 0.61–1.24)
GFR calc Af Amer: >60 mL/min (ref >60)
GFR calc non Af Amer: >60 mL/min (ref >60)
Glucose, Bld: 95 mg/dL (ref 70–99)
Potassium: 4.6 mmol/L (ref 3.5–5.1)
Sodium: 141 mmol/L (ref 135–145)

## 2019-12-27 LAB — CBC WITH DIFFERENTIAL/PLATELET
Abs Immature Granulocytes: 0.01 10*3/uL (ref 0.00–0.07)
Basophils Absolute: 0 10*3/uL (ref 0.0–0.1)
Basophils Relative: 1 %
Eosinophils Absolute: 0.2 10*3/uL (ref 0.0–0.5)
Eosinophils Relative: 5 %
HCT: 45.3 % (ref 39.0–52.0)
Hemoglobin: 14.5 g/dL (ref 13.0–17.0)
Immature Granulocytes: 0 %
Lymphocytes Relative: 44 %
Lymphs Abs: 2 10*3/uL (ref 0.7–4.0)
MCH: 26.7 pg (ref 26.0–34.0)
MCHC: 32 g/dL (ref 30.0–36.0)
MCV: 83.3 fL (ref 80.0–100.0)
Monocytes Absolute: 0.6 10*3/uL (ref 0.1–1.0)
Monocytes Relative: 13 %
Neutro Abs: 1.7 10*3/uL (ref 1.7–7.7)
Neutrophils Relative %: 37 %
Platelets: 243 10*3/uL (ref 150–400)
RBC: 5.44 MIL/uL (ref 4.22–5.81)
RDW: 16.3 % — ABNORMAL HIGH (ref 11.5–15.5)
WBC: 4.5 10*3/uL (ref 4.0–10.5)
nRBC: 0 % (ref 0.0–0.2)

## 2019-12-27 LAB — SURGICAL PCR SCREEN
MRSA, PCR: NEGATIVE
Staphylococcus aureus: NEGATIVE

## 2019-12-27 NOTE — Progress Notes (Signed)
PCP Nancy Fetter, MD at [facility] - [name], MD and Joylene Grapes, MD Pulmonologist - Annamaria Boots, MD Gastroenterologist - Havery Moros, MD  PPM/ICD - pacemaker 2009 Device Orders - emailed 12/26/19 Rep Notified - emailed 12/26/19 Dannial Monarch >>Lindsi Granville Lewis, RN cc'd in both   Chest x-ray - 05/19/19 EKG - 05/10/19 Stress Test - pt denies ECHO - 05/05/17 Cardiac Cath - 05/08/16   CPAP -  yes   Blood Thinner Instructions: Follow your surgeon's instructions on when to stop Eliquis.  If no instructions were given by your surgeon then you will need to call the office to get those instructions.    >> per pt and surgeon's instructions, last dose Eliquis 12/28/19 (PT-INR DOS)   ERAS Protcol - n/a PRE-SURGERY Ensure or G2- n/a  COVID TEST- 12/29/19   Coronavirus Screening  Have you experienced the following symptoms:  Cough yes/no: No Fever (>100.33F)  yes/no: No Runny nose yes/no: No Sore throat yes/no: No Difficulty breathing/shortness of breath  yes/no: No  Have you or a family member traveled in the last 14 days and where? yes/no: No   If the patient indicates "YES" to the above questions, their PAT will be rescheduled to limit the exposure to others and, the surgeon will be notified. THE PATIENT WILL NEED TO BE ASYMPTOMATIC FOR 14 DAYS.   If the patient is not experiencing any of these symptoms, the PAT nurse will instruct them to NOT bring anyone with them to their appointment since they may have these symptoms or traveled as well.   Please remind your patients and families that hospital visitation restrictions are in effect and the importance of the restrictions.     Anesthesia review: yes, device orders requested/ cardiac hx  Patient denies shortness of breath, fever, cough and chest pain at PAT appointment   All instructions explained to the patient, with a verbal understanding of the material. Patient agrees to go over the instructions while at home for a better understanding.  Patient also instructed to self quarantine after being tested for COVID-19. The opportunity to ask questions was provided.

## 2019-12-27 NOTE — Progress Notes (Signed)
[facility] DEVICE PROGRAMMING  Patient Information: Name:  [name]  DOB:  Aug 23, 1961  MRN:  [id number]    Planned Procedure: ACDF - Cervical7-Thoracic1  Surgeon: Dr. Earnie Larsson  Date of Procedure: 01/01/2020  Cautery will be used.  Position during surgery: Supine   Please send documentation back to:  [facility] (Fax # [phone])   Device Information:   Clinic EP Physician:  Thompson Grayer, MD  Device Type:  Pacemaker Manufacturer and Phone #:  Medtronic: [id number] Pacemaker Dependent?:  No. Date of Last Device Check:  12/26/2019 Normal Device Function?:  Yes.    Electrophysiologist's Recommendations:   Have magnet available.  Provide continuous ECG monitoring when magnet is used or reprogramming is to be performed.   Procedure may interfere with device function.  Magnet should be placed over device during procedure.  Per Device Clinic [address] [place], [place]  1:08 PM 12/27/2019

## 2019-12-27 NOTE — Addendum Note (Signed)
Addended by: Gar Ponto on: 12/27/2019 02:41 PM   Modules accepted: Orders

## 2019-12-28 NOTE — Progress Notes (Addendum)
Anesthesia Chart Review:  Follows with cardiology for hx of CAD s/p NSTEMI possibly type II (cath showing nonobstructive disease 2017), afib s/p multiple ablations on Eliquis, sinus node dysfunction s/p Medtronic PPM, HFrEF, PE. Cleared for surgery per office visit note 12/26/19, "For C7-T1 anterior cervical fusion 01/01/2020. EF 35-40% by TEE 05/2017. LHC 05/2016 with non-obstructive CAD. He is cleared for surgery from a cardiac perspective with AT LEAST moderate risk (6.6% estimated rate of MI, pulmonary edema, VF, or cardiac arrest perioperatively) by Revised Cardiac Risk Index Truman Hayward Criteria). Will need device clearance/instructions as well due to proximity to device." Also cleared to hold Eliquis 3d preop.  Periop device recommendations in Epic encounter dated 12/27/19. Copied below: Electrophysiologist's Recommendations:   Have magnet available.  Provide continuous ECG monitoring when magnet is used or reprogramming is to be performed.   Procedure may interfere with device function.  Magnet should be placed over device during procedure.  OSA on CPAP followed by Dr. Annamaria Boots. Clearance from Dr. Annamaria Boots on pt chart states low risk from pulm standpoint.   Preop labs reviewed, unremarkable.   EKG 12/26/19: NSR. Rate 65.  TEE 05/05/17: - Left ventricle: The cavity size was mildly dilated. Wall  thickness was normal. Systolic function was moderately reduced.  The estimated ejection fraction was in the range of 35% to 40%.  Diffuse hypokinesis.  - Aortic valve: There was no stenosis. There was trivial  regurgitation.  - Aorta: Normal caliber thoracic aorta with mild plaque.  - Mitral valve: There was trivial regurgitation.  - Left atrium: The atrium was moderately dilated. No evidence of  thrombus in the atrial cavity or appendage. No evidence of  thrombus in the atrial cavity or appendage.  - Right ventricle: The cavity size was mildly dilated. Systolic  function was mildly reduced.   - Right atrium: No evidence of thrombus in the atrial cavity or  appendage.  - Atrial septum: No defect or patent foramen ovale was identified.   Impressions:   - No LA thrombus, ok for ablation.   Cath 05/08/16: 1. Nonobstructive CAD 2. Mild to moderate LV dysfunction. EF 45% 3. Normal LV filling pressures.   Wynonia Musty [facility] Short Stay Center/Anesthesiology Phone [phone] 12/28/2019 9:58 AM

## 2019-12-28 NOTE — Anesthesia Preprocedure Evaluation (Addendum)
Anesthesia Evaluation  Patient identified by MRN, date of birth, ID band Patient awake    Reviewed: Allergy & Precautions, H&P , NPO status , Patient's Chart, lab work & pertinent test results  Airway Mallampati: II   Neck ROM: full    Dental   Pulmonary sleep apnea , former smoker,    breath sounds clear to auscultation       Cardiovascular hypertension, + CAD, + Past MI and +CHF  + dysrhythmias Atrial Fibrillation + pacemaker  Rhythm:regular Rate:Normal  Non-obstructive CAD by cath.  Takes eliquis.   Neuro/Psych  Headaches, PSYCHIATRIC DISORDERS Depression  Neuromuscular disease    GI/Hepatic PUD, GERD  ,(+)     substance abuse  ,   Endo/Other  Hyperthyroidism obese  Renal/GU      Musculoskeletal  (+) Arthritis ,   Abdominal   Peds  Hematology   Anesthesia Other Findings   Reproductive/Obstetrics                            Anesthesia Physical Anesthesia Plan  ASA: III  Anesthesia Plan: General   Post-op Pain Management:    Induction: Intravenous  PONV Risk Score and Plan: 2 and Ondansetron, Dexamethasone, Midazolam and Treatment may vary due to age or medical condition  Airway Management Planned: Oral ETT  Additional Equipment:   Intra-op Plan:   Post-operative Plan: Extubation in OR  Informed Consent: I have reviewed the patients History and Physical, chart, labs and discussed the procedure including the risks, benefits and alternatives for the proposed anesthesia with the patient or authorized representative who has indicated his/her understanding and acceptance.       Plan Discussed with: CRNA, Anesthesiologist and Surgeon  Anesthesia Plan Comments: (Follows with cardiology for hx of CAD s/p NSTEMI possibly type II (cath showing nonobstructive disease 2017), afib s/p multiple ablations on Eliquis, sinus node dysfunction s/p Medtronic PPM, HFrEF, PE. Cleared for surgery  per office visit note 12/26/19, "For C7-T1 anterior cervical fusion 01/01/2020. EF 35-40% by TEE 05/2017. LHC 05/2016 with non-obstructive CAD. He is cleared for surgery from a cardiac perspective with AT LEAST moderate risk (6.6% estimated rate of MI, pulmonary edema, VF, or cardiac arrest perioperatively) by Revised Cardiac Risk Index Truman Hayward Criteria). Will need device clearance/instructions as well due to proximity to device." Also cleared to hold Eliquis 3d preop.  Periop device recommendations in Epic encounter dated 12/27/19. Copied below: Electrophysiologist's Recommendations:  Have magnet available. Provide continuous ECG monitoring when magnet is used or reprogramming is to be performed.  Procedure may interfere with device function.  Magnet should be placed over device during procedure.  OSA on CPAP followed by Dr. Annamaria Boots. Clearance from Dr. Annamaria Boots on pt chart states low risk from pulm standpoint.   Preop labs reviewed, unremarkable.   EKG 12/26/19: NSR. Rate 65.  TEE 05/05/17: - Left ventricle: The cavity size was mildly dilated. Wall  thickness was normal. Systolic function was moderately reduced.  The estimated ejection fraction was in the range of 35% to 40%.  Diffuse hypokinesis.  - Aortic valve: There was no stenosis. There was trivial  regurgitation.  - Aorta: Normal caliber thoracic aorta with mild plaque.  - Mitral valve: There was trivial regurgitation.  - Left atrium: The atrium was moderately dilated. No evidence of  thrombus in the atrial cavity or appendage. No evidence of  thrombus in the atrial cavity or appendage.  - Right ventricle: The cavity size was mildly  dilated. Systolic  function was mildly reduced.  - Right atrium: No evidence of thrombus in the atrial cavity or  appendage.  - Atrial septum: No defect or patent foramen ovale was identified.   Impressions:   - No LA thrombus, ok for ablation.   Cath 05/08/16: 1. Nonobstructive CAD 2. Mild  to moderate LV dysfunction. EF 45% 3. Normal LV filling pressures. )      Anesthesia Quick Evaluation

## 2019-12-29 ENCOUNTER — Other Ambulatory Visit (HOSPITAL_COMMUNITY)
Admission: RE | Admit: 2019-12-29 | Discharge: 2019-12-29 | Disposition: A | Discharge status: Home / Self Care | Payer: Medicaid Other | Source: Ambulatory Visit | Attending: Neurosurgery | Admitting: Neurosurgery

## 2019-12-29 DIAGNOSIS — Z20822 Contact with and (suspected) exposure to covid-19: Secondary | ICD-10-CM | POA: Insufficient documentation

## 2019-12-29 DIAGNOSIS — Z01812 Encounter for preprocedural laboratory examination: Secondary | ICD-10-CM | POA: Insufficient documentation

## 2019-12-29 LAB — SARS CORONAVIRUS 2 (TAT 6-24 HRS): SARS Coronavirus 2: NEGATIVE

## 2019-12-29 MED ORDER — DEXTROSE 5 % IV SOLN
3.0000 g | INTRAVENOUS | Status: AC
  Administered 2020-01-01: 2 g via INTRAVENOUS
  Filled 2019-12-29: qty 3
  Filled 2019-12-29: qty 3000

## 2020-01-01 ENCOUNTER — Ambulatory Visit (HOSPITAL_COMMUNITY): Payer: Medicaid Other

## 2020-01-01 ENCOUNTER — Encounter (HOSPITAL_COMMUNITY)
Admission: RE | Disposition: A | Discharge status: Home / Self Care | Payer: Self-pay | Source: Home / Self Care | Attending: Neurosurgery

## 2020-01-01 ENCOUNTER — Ambulatory Visit (HOSPITAL_COMMUNITY): Payer: Medicaid Other | Admitting: Physician Assistant

## 2020-01-01 ENCOUNTER — Ambulatory Visit (HOSPITAL_COMMUNITY)
Admission: RE | Admit: 2020-01-01 | Discharge: 2020-01-02 | Disposition: A | Discharge status: Home / Self Care | Payer: Medicaid Other | Attending: Neurosurgery | Admitting: Neurosurgery

## 2020-01-01 ENCOUNTER — Encounter (HOSPITAL_COMMUNITY): Payer: Self-pay | Admitting: Neurosurgery

## 2020-01-01 ENCOUNTER — Other Ambulatory Visit: Payer: Self-pay

## 2020-01-01 ENCOUNTER — Ambulatory Visit (HOSPITAL_COMMUNITY): Payer: Medicaid Other | Admitting: Anesthesiology

## 2020-01-01 DIAGNOSIS — E669 Obesity, unspecified: Secondary | ICD-10-CM | POA: Insufficient documentation

## 2020-01-01 DIAGNOSIS — Z8719 Personal history of other diseases of the digestive system: Secondary | ICD-10-CM | POA: Insufficient documentation

## 2020-01-01 DIAGNOSIS — Z791 Long term (current) use of non-steroidal anti-inflammatories (NSAID): Secondary | ICD-10-CM | POA: Insufficient documentation

## 2020-01-01 DIAGNOSIS — E785 Hyperlipidemia, unspecified: Secondary | ICD-10-CM | POA: Insufficient documentation

## 2020-01-01 DIAGNOSIS — E0590 Thyrotoxicosis, unspecified without thyrotoxic crisis or storm: Secondary | ICD-10-CM | POA: Diagnosis not present

## 2020-01-01 DIAGNOSIS — Z6839 Body mass index (BMI) 39.0-39.9, adult: Secondary | ICD-10-CM | POA: Insufficient documentation

## 2020-01-01 DIAGNOSIS — I2510 Atherosclerotic heart disease of native coronary artery without angina pectoris: Secondary | ICD-10-CM | POA: Insufficient documentation

## 2020-01-01 DIAGNOSIS — Z885 Allergy status to narcotic agent status: Secondary | ICD-10-CM | POA: Diagnosis not present

## 2020-01-01 DIAGNOSIS — Z7901 Long term (current) use of anticoagulants: Secondary | ICD-10-CM | POA: Insufficient documentation

## 2020-01-01 DIAGNOSIS — F4310 Post-traumatic stress disorder, unspecified: Secondary | ICD-10-CM | POA: Diagnosis not present

## 2020-01-01 DIAGNOSIS — G4730 Sleep apnea, unspecified: Secondary | ICD-10-CM | POA: Diagnosis not present

## 2020-01-01 DIAGNOSIS — Z79899 Other long term (current) drug therapy: Secondary | ICD-10-CM | POA: Insufficient documentation

## 2020-01-01 DIAGNOSIS — M4722 Other spondylosis with radiculopathy, cervical region: Secondary | ICD-10-CM | POA: Diagnosis not present

## 2020-01-01 DIAGNOSIS — Z950 Presence of cardiac pacemaker: Secondary | ICD-10-CM | POA: Insufficient documentation

## 2020-01-01 DIAGNOSIS — I252 Old myocardial infarction: Secondary | ICD-10-CM | POA: Insufficient documentation

## 2020-01-01 DIAGNOSIS — Z87891 Personal history of nicotine dependence: Secondary | ICD-10-CM | POA: Insufficient documentation

## 2020-01-01 DIAGNOSIS — Z86711 Personal history of pulmonary embolism: Secondary | ICD-10-CM | POA: Insufficient documentation

## 2020-01-01 DIAGNOSIS — Z981 Arthrodesis status: Secondary | ICD-10-CM | POA: Insufficient documentation

## 2020-01-01 DIAGNOSIS — I4891 Unspecified atrial fibrillation: Secondary | ICD-10-CM | POA: Diagnosis not present

## 2020-01-01 DIAGNOSIS — M4712 Other spondylosis with myelopathy, cervical region: Secondary | ICD-10-CM | POA: Diagnosis not present

## 2020-01-01 DIAGNOSIS — I5042 Chronic combined systolic (congestive) and diastolic (congestive) heart failure: Secondary | ICD-10-CM | POA: Insufficient documentation

## 2020-01-01 DIAGNOSIS — Z803 Family history of malignant neoplasm of breast: Secondary | ICD-10-CM | POA: Insufficient documentation

## 2020-01-01 DIAGNOSIS — Z801 Family history of malignant neoplasm of trachea, bronchus and lung: Secondary | ICD-10-CM | POA: Insufficient documentation

## 2020-01-01 DIAGNOSIS — Z888 Allergy status to other drugs, medicaments and biological substances status: Secondary | ICD-10-CM | POA: Diagnosis not present

## 2020-01-01 DIAGNOSIS — I110 Hypertensive heart disease with heart failure: Secondary | ICD-10-CM | POA: Diagnosis not present

## 2020-01-01 DIAGNOSIS — Z8711 Personal history of peptic ulcer disease: Secondary | ICD-10-CM | POA: Insufficient documentation

## 2020-01-01 DIAGNOSIS — Z833 Family history of diabetes mellitus: Secondary | ICD-10-CM | POA: Insufficient documentation

## 2020-01-01 DIAGNOSIS — Z8249 Family history of ischemic heart disease and other diseases of the circulatory system: Secondary | ICD-10-CM | POA: Insufficient documentation

## 2020-01-01 DIAGNOSIS — Z419 Encounter for procedure for purposes other than remedying health state, unspecified: Secondary | ICD-10-CM

## 2020-01-01 HISTORY — PX: ANTERIOR CERVICAL DECOMP/DISCECTOMY FUSION: SHX1161

## 2020-01-01 LAB — PROTIME-INR
INR: 1 (ref 0.8–1.2)
Prothrombin Time: 12.8 seconds (ref 11.4–15.2)

## 2020-01-01 SURGERY — ANTERIOR CERVICAL DECOMPRESSION/DISCECTOMY FUSION 1 LEVEL
Anesthesia: General

## 2020-01-01 MED ORDER — CYANOCOBALAMIN 1000 MCG/ML IJ SOLN: 1000.0000 ug | INTRAMUSCULAR | Status: DC

## 2020-01-01 MED ORDER — OXYCODONE-ACETAMINOPHEN 10-325 MG PO TABS: 1.0000 | ORAL_TABLET | ORAL | Status: DC | PRN

## 2020-01-01 MED ORDER — SODIUM CHLORIDE 0.9 % IV SOLN
250.0000 mL | INTRAVENOUS | Status: DC
  Administered 2020-01-01: 250 mL via INTRAVENOUS

## 2020-01-01 MED ORDER — ACETAMINOPHEN 325 MG PO TABS: 650.0000 mg | ORAL_TABLET | ORAL | Status: DC | PRN

## 2020-01-01 MED ORDER — 0.9 % SODIUM CHLORIDE (POUR BTL) OPTIME
TOPICAL | Status: DC | PRN
  Administered 2020-01-01: 09:00:00 1000 mL

## 2020-01-01 MED ORDER — ATORVASTATIN CALCIUM 40 MG PO TABS
40.0000 mg | ORAL_TABLET | Freq: Every day | ORAL | Status: DC
  Administered 2020-01-01: 40 mg via ORAL
  Filled 2020-01-01: qty 1

## 2020-01-01 MED ORDER — MENTHOL 3 MG MT LOZG: 1.0000 | LOZENGE | OROMUCOSAL | Status: DC | PRN

## 2020-01-01 MED ORDER — SUGAMMADEX SODIUM 500 MG/5ML IV SOLN
INTRAVENOUS | Status: AC
  Filled 2020-01-01: qty 5

## 2020-01-01 MED ORDER — OXYCODONE HCL 5 MG PO TABS
5.0000 mg | ORAL_TABLET | Freq: Once | ORAL | Status: AC | PRN
  Administered 2020-01-01: 5 mg via ORAL

## 2020-01-01 MED ORDER — DEXMEDETOMIDINE HCL 200 MCG/2ML IV SOLN
INTRAVENOUS | Status: DC | PRN
  Administered 2020-01-01: 8 ug via INTRAVENOUS
  Administered 2020-01-01: 4 ug via INTRAVENOUS

## 2020-01-01 MED ORDER — MIDAZOLAM HCL 2 MG/2ML IJ SOLN
INTRAMUSCULAR | Status: AC
  Filled 2020-01-01: qty 2

## 2020-01-01 MED ORDER — PHENOL 1.4 % MT LIQD: 1.0000 | OROMUCOSAL | Status: DC | PRN

## 2020-01-01 MED ORDER — FENTANYL CITRATE (PF) 250 MCG/5ML IJ SOLN
INTRAMUSCULAR | Status: DC | PRN
  Administered 2020-01-01: 100 ug via INTRAVENOUS
  Administered 2020-01-01: 150 ug via INTRAVENOUS

## 2020-01-01 MED ORDER — PHENYLEPHRINE HCL-NACL 10-0.9 MG/250ML-% IV SOLN
INTRAVENOUS | Status: DC | PRN
  Administered 2020-01-01: 25 ug/min via INTRAVENOUS

## 2020-01-01 MED ORDER — HEMOSTATIC AGENTS (NO CHARGE) OPTIME
TOPICAL | Status: DC | PRN
  Administered 2020-01-01: 1 via TOPICAL

## 2020-01-01 MED ORDER — ONDANSETRON HCL 4 MG PO TABS: 4.0000 mg | ORAL_TABLET | Freq: Four times a day (QID) | ORAL | Status: DC | PRN

## 2020-01-01 MED ORDER — ONDANSETRON HCL 4 MG/2ML IJ SOLN: 4.0000 mg | Freq: Four times a day (QID) | INTRAMUSCULAR | Status: DC | PRN

## 2020-01-01 MED ORDER — CYCLOBENZAPRINE HCL 10 MG PO TABS
10.0000 mg | ORAL_TABLET | Freq: Three times a day (TID) | ORAL | Status: DC | PRN
  Administered 2020-01-02: 10 mg via ORAL
  Filled 2020-01-01: qty 1

## 2020-01-01 MED ORDER — GABAPENTIN 100 MG PO CAPS
100.0000 mg | ORAL_CAPSULE | Freq: Every day | ORAL | Status: DC
  Administered 2020-01-02: 100 mg via ORAL
  Filled 2020-01-01: qty 1

## 2020-01-01 MED ORDER — METHIMAZOLE 10 MG PO TABS
10.0000 mg | ORAL_TABLET | Freq: Every day | ORAL | Status: DC
  Filled 2020-01-01 (×2): qty 1

## 2020-01-01 MED ORDER — PANTOPRAZOLE SODIUM 40 MG PO TBEC
40.0000 mg | DELAYED_RELEASE_TABLET | Freq: Every day | ORAL | Status: DC
  Administered 2020-01-02: 40 mg via ORAL
  Filled 2020-01-01: qty 1

## 2020-01-01 MED ORDER — MIDAZOLAM HCL 5 MG/5ML IJ SOLN
INTRAMUSCULAR | Status: DC | PRN
  Administered 2020-01-01: 2 mg via INTRAVENOUS

## 2020-01-01 MED ORDER — FENTANYL CITRATE (PF) 250 MCG/5ML IJ SOLN
INTRAMUSCULAR | Status: AC
  Filled 2020-01-01: qty 5

## 2020-01-01 MED ORDER — LIDOCAINE 2% (20 MG/ML) 5 ML SYRINGE
INTRAMUSCULAR | Status: DC | PRN
  Administered 2020-01-01: 100 mg via INTRAVENOUS

## 2020-01-01 MED ORDER — ACETAMINOPHEN 650 MG RE SUPP: 650.0000 mg | RECTAL | Status: DC | PRN

## 2020-01-01 MED ORDER — CHLORHEXIDINE GLUCONATE CLOTH 2 % EX PADS: 6.0000 | MEDICATED_PAD | Freq: Once | CUTANEOUS | Status: DC

## 2020-01-01 MED ORDER — DEXAMETHASONE SODIUM PHOSPHATE 10 MG/ML IJ SOLN
INTRAMUSCULAR | Status: DC | PRN
  Administered 2020-01-01: 8 mg via INTRAVENOUS

## 2020-01-01 MED ORDER — THROMBIN 5000 UNITS EX SOLR
OROMUCOSAL | Status: DC | PRN
  Administered 2020-01-01: 09:00:00 5 mL via TOPICAL

## 2020-01-01 MED ORDER — SODIUM CHLORIDE 0.9% FLUSH: 3.0000 mL | INTRAVENOUS | Status: DC | PRN

## 2020-01-01 MED ORDER — FENTANYL CITRATE (PF) 100 MCG/2ML IJ SOLN
INTRAMUSCULAR | Status: AC
  Filled 2020-01-01: qty 2

## 2020-01-01 MED ORDER — OXYCODONE HCL 5 MG PO TABS
5.0000 mg | ORAL_TABLET | ORAL | Status: DC | PRN
  Administered 2020-01-01 – 2020-01-02 (×4): 5 mg via ORAL
  Filled 2020-01-01 (×4): qty 1

## 2020-01-01 MED ORDER — SUGAMMADEX SODIUM 200 MG/2ML IV SOLN
INTRAVENOUS | Status: DC | PRN
  Administered 2020-01-01: 250 mg via INTRAVENOUS

## 2020-01-01 MED ORDER — OXYCODONE HCL 5 MG PO TABS
ORAL_TABLET | ORAL | Status: AC
  Filled 2020-01-01: qty 1

## 2020-01-01 MED ORDER — GABAPENTIN 100 MG PO CAPS
200.0000 mg | ORAL_CAPSULE | Freq: Every day | ORAL | Status: DC
  Administered 2020-01-01: 200 mg via ORAL
  Filled 2020-01-01: qty 2

## 2020-01-01 MED ORDER — THROMBIN 5000 UNITS EX SOLR
CUTANEOUS | Status: AC
  Filled 2020-01-01: qty 10000

## 2020-01-01 MED ORDER — PROPOFOL 10 MG/ML IV BOLUS
INTRAVENOUS | Status: DC | PRN
  Administered 2020-01-01: 180 mg via INTRAVENOUS

## 2020-01-01 MED ORDER — SODIUM CHLORIDE 0.9% FLUSH
3.0000 mL | Freq: Two times a day (BID) | INTRAVENOUS | Status: DC
  Administered 2020-01-01 (×2): 3 mL via INTRAVENOUS

## 2020-01-01 MED ORDER — OXYCODONE-ACETAMINOPHEN 5-325 MG PO TABS
1.0000 | ORAL_TABLET | ORAL | Status: DC | PRN
  Administered 2020-01-01 – 2020-01-02 (×4): 1 via ORAL
  Filled 2020-01-01 (×4): qty 1

## 2020-01-01 MED ORDER — THROMBIN 5000 UNITS EX SOLR
CUTANEOUS | Status: AC
  Filled 2020-01-01: qty 5000

## 2020-01-01 MED ORDER — THROMBIN 5000 UNITS EX SOLR
CUTANEOUS | Status: DC | PRN
  Administered 2020-01-01: 10000 [IU] via TOPICAL

## 2020-01-01 MED ORDER — ROCURONIUM BROMIDE 10 MG/ML (PF) SYRINGE
PREFILLED_SYRINGE | INTRAVENOUS | Status: DC | PRN
  Administered 2020-01-01: 60 mg via INTRAVENOUS
  Administered 2020-01-01: 40 mg via INTRAVENOUS

## 2020-01-01 MED ORDER — LACTATED RINGERS IV SOLN: INTRAVENOUS | Status: DC

## 2020-01-01 MED ORDER — CARVEDILOL 25 MG PO TABS
25.0000 mg | ORAL_TABLET | Freq: Two times a day (BID) | ORAL | Status: DC
  Administered 2020-01-01 – 2020-01-02 (×2): 25 mg via ORAL
  Filled 2020-01-01 (×2): qty 1

## 2020-01-01 MED ORDER — PROPOFOL 10 MG/ML IV BOLUS
INTRAVENOUS | Status: AC
  Filled 2020-01-01: qty 40

## 2020-01-01 MED ORDER — IBUPROFEN 800 MG PO TABS: 800.0000 mg | ORAL_TABLET | Freq: Three times a day (TID) | ORAL | Status: DC | PRN

## 2020-01-01 MED ORDER — CEFAZOLIN SODIUM-DEXTROSE 1-4 GM/50ML-% IV SOLN
1.0000 g | Freq: Three times a day (TID) | INTRAVENOUS | Status: AC
  Administered 2020-01-01 (×2): 1 g via INTRAVENOUS
  Filled 2020-01-01 (×2): qty 50

## 2020-01-01 MED ORDER — OXYCODONE HCL 5 MG/5ML PO SOLN: 5.0000 mg | Freq: Once | ORAL | Status: AC | PRN

## 2020-01-01 MED ORDER — ONDANSETRON HCL 4 MG/2ML IJ SOLN
INTRAMUSCULAR | Status: DC | PRN
  Administered 2020-01-01: 4 mg via INTRAVENOUS

## 2020-01-01 MED ORDER — HYDROMORPHONE HCL 1 MG/ML IJ SOLN
1.0000 mg | INTRAMUSCULAR | Status: DC | PRN
  Administered 2020-01-01: 1 mg via INTRAVENOUS
  Filled 2020-01-01: qty 1

## 2020-01-01 MED ORDER — SODIUM CHLORIDE 0.9 % IV SOLN
INTRAVENOUS | Status: DC | PRN
  Administered 2020-01-01: 500 mL

## 2020-01-01 MED ORDER — VITAMIN D 25 MCG (1000 UNIT) PO TABS
1000.0000 [IU] | ORAL_TABLET | Freq: Every day | ORAL | Status: DC
  Administered 2020-01-02: 1000 [IU] via ORAL
  Filled 2020-01-01: qty 1

## 2020-01-01 MED ORDER — FENTANYL CITRATE (PF) 100 MCG/2ML IJ SOLN
25.0000 ug | INTRAMUSCULAR | Status: DC | PRN
  Administered 2020-01-01 (×2): 25 ug via INTRAVENOUS
  Administered 2020-01-01 (×2): 50 ug via INTRAVENOUS

## 2020-01-01 MED ORDER — DESONIDE 0.05 % EX CREA: 1.0000 "application " | TOPICAL_CREAM | Freq: Two times a day (BID) | CUTANEOUS | Status: DC | PRN

## 2020-01-01 MED ORDER — DOFETILIDE 500 MCG PO CAPS
500.0000 ug | ORAL_CAPSULE | Freq: Two times a day (BID) | ORAL | Status: DC
  Administered 2020-01-01 – 2020-01-02 (×2): 500 ug via ORAL
  Filled 2020-01-01 (×3): qty 1

## 2020-01-01 SURGICAL SUPPLY — 53 items
APL SKNCLS STERI-STRIP NONHPOA (GAUZE/BANDAGES/DRESSINGS) ×1
BAG DECANTER FOR FLEXI CONT (MISCELLANEOUS) ×2 IMPLANT
BENZOIN TINCTURE PRP APPL 2/3 (GAUZE/BANDAGES/DRESSINGS) ×2 IMPLANT
BIT DRILL 13 (BIT) ×1 IMPLANT
BUR MATCHSTICK NEURO 3.0 LAGG (BURR) ×2 IMPLANT
CAGE PEEK 11X14X11 (Cage) ×1 IMPLANT
CANISTER SUCT 3000ML PPV (MISCELLANEOUS) ×2 IMPLANT
CARTRIDGE OIL MAESTRO DRILL (MISCELLANEOUS) ×1 IMPLANT
COVER WAND RF STERILE (DRAPES) ×1 IMPLANT
DIFFUSER DRILL AIR PNEUMATIC (MISCELLANEOUS) ×2 IMPLANT
DRAPE C-ARM 42X72 X-RAY (DRAPES) ×4 IMPLANT
DRAPE LAPAROTOMY 100X72 PEDS (DRAPES) ×2 IMPLANT
DRAPE MICROSCOPE LEICA (MISCELLANEOUS) ×2 IMPLANT
DURAPREP 6ML APPLICATOR 50/CS (WOUND CARE) ×2 IMPLANT
ELECT COATED BLADE 2.86 ST (ELECTRODE) ×2 IMPLANT
ELECT REM PT RETURN 9FT ADLT (ELECTROSURGICAL) ×2
ELECTRODE REM PT RTRN 9FT ADLT (ELECTROSURGICAL) ×1 IMPLANT
EVACUATOR 1/8 PVC DRAIN (DRAIN) ×1 IMPLANT
GAUZE 4X4 16PLY RFD (DISPOSABLE) IMPLANT
GAUZE SPONGE 4X4 12PLY STRL (GAUZE/BANDAGES/DRESSINGS) ×2 IMPLANT
GLOVE ECLIPSE 9.0 STRL (GLOVE) ×2 IMPLANT
GLOVE EXAM NITRILE XL STR (GLOVE) IMPLANT
GOWN STRL REUS W/ TWL LRG LVL3 (GOWN DISPOSABLE) IMPLANT
GOWN STRL REUS W/ TWL XL LVL3 (GOWN DISPOSABLE) IMPLANT
GOWN STRL REUS W/TWL 2XL LVL3 (GOWN DISPOSABLE) IMPLANT
GOWN STRL REUS W/TWL LRG LVL3 (GOWN DISPOSABLE)
GOWN STRL REUS W/TWL XL LVL3 (GOWN DISPOSABLE)
HALTER HD/CHIN CERV TRACTION D (MISCELLANEOUS) ×2 IMPLANT
HEMOSTAT POWDER KIT SURGIFOAM (HEMOSTASIS) ×1 IMPLANT
KIT BASIN OR (CUSTOM PROCEDURE TRAY) ×2 IMPLANT
KIT TURNOVER KIT B (KITS) ×2 IMPLANT
NDL SPNL 20GX3.5 QUINCKE YW (NEEDLE) ×1 IMPLANT
NEEDLE SPNL 20GX3.5 QUINCKE YW (NEEDLE) ×2 IMPLANT
NS IRRIG 1000ML POUR BTL (IV SOLUTION) ×2 IMPLANT
OIL CARTRIDGE MAESTRO DRILL (MISCELLANEOUS) ×2
PACK LAMINECTOMY NEURO (CUSTOM PROCEDURE TRAY) ×2 IMPLANT
PAD ARMBOARD 7.5X6 YLW CONV (MISCELLANEOUS) ×6 IMPLANT
PIN DISTRACTION 14MM (PIN) ×2 IMPLANT
PLATE ELITE VISION 25MM (Plate) ×1 IMPLANT
RUBBERBAND STERILE (MISCELLANEOUS) ×4 IMPLANT
SCREW ST 13X4XST VA NS SPNE (Screw) IMPLANT
SCREW ST VAR 4 ATL (Screw) ×8 IMPLANT
SPONGE INTESTINAL PEANUT (DISPOSABLE) ×2 IMPLANT
SPONGE SURGIFOAM ABS GEL SZ50 (HEMOSTASIS) ×2 IMPLANT
STRIP CLOSURE SKIN 1/2X4 (GAUZE/BANDAGES/DRESSINGS) ×2 IMPLANT
SUT VIC AB 3-0 SH 8-18 (SUTURE) ×2 IMPLANT
SUT VIC AB 4-0 RB1 18 (SUTURE) ×2 IMPLANT
TAPE CLOTH 4X10 WHT NS (GAUZE/BANDAGES/DRESSINGS) ×2 IMPLANT
TAPE CLOTH SURG 6X10 WHT LF (GAUZE/BANDAGES/DRESSINGS) ×1 IMPLANT
TOWEL GREEN STERILE (TOWEL DISPOSABLE) ×2 IMPLANT
TOWEL GREEN STERILE FF (TOWEL DISPOSABLE) ×2 IMPLANT
TRAP SPECIMEN MUCOUS 40CC (MISCELLANEOUS) ×2 IMPLANT
WATER STERILE IRR 1000ML POUR (IV SOLUTION) ×2 IMPLANT

## 2020-01-01 NOTE — Anesthesia Procedure Notes (Signed)
Procedure Name: Intubation Date/Time: 01/01/2020 8:16 AM Performed by: Mariea Clonts, CRNA Pre-anesthesia Checklist: Patient identified, Emergency Drugs available, Suction available and Patient being monitored Patient Re-evaluated:Patient Re-evaluated prior to induction Oxygen Delivery Method: Circle System Utilized Preoxygenation: Pre-oxygenation with 100% oxygen Induction Type: IV induction Ventilation: Mask ventilation without difficulty and Oral airway inserted - appropriate to patient size Laryngoscope Size: Sabra Heck and 2 Grade View: Grade I Tube type: Oral Tube size: 8.0 mm Number of attempts: 1 Airway Equipment and Method: Stylet and Oral airway Placement Confirmation: ETT inserted through vocal cords under direct vision,  positive ETCO2 and breath sounds checked- equal and bilateral Tube secured with: Tape Dental Injury: Teeth and Oropharynx as per pre-operative assessment

## 2020-01-01 NOTE — Transfer of Care (Signed)
Immediate Anesthesia Transfer of Care Note  Patient: [name]  Procedure(s) Performed: Anterior Cervical Discectomy Fusion - Cervical Seven- Thoracic One (N/A )  Patient Location: PACU  Anesthesia Type:General  Level of Consciousness: awake, alert  and oriented  Airway & Oxygen Therapy: Patient Spontanous Breathing and Patient connected to nasal cannula oxygen  Post-op Assessment: Report given to RN, Post -op Vital signs reviewed and stable and Patient moving all extremities X 4  Post vital signs: Reviewed and stable  Last Vitals:  Vitals Value Taken Time  BP 158/101 01/01/20 1033  Temp 36.6 C 01/01/20 1010  Pulse 59 01/01/20 1034  Resp 24 01/01/20 1034  SpO2 100 % 01/01/20 1034  Vitals shown include unvalidated device data.  Last Pain:  Vitals:   01/01/20 1010  TempSrc:   PainSc: Asleep      Patients Stated Pain Goal: 3 ([id number])  Complications: No apparent anesthesia complications

## 2020-01-01 NOTE — H&P (Signed)
[name] is an 59 y.o. male.   Chief Complaint: Neck pain HPI: 59 year old male with chronic and progressive neck pain with bilateral upper extremity and lower extremity decrease in dexterity.  Patient with progressive gait instability.  Patient with progressive numbness into the ulnar aspects of both hands.  A work-up demonstrates evidence of significant spondylosis and stenosis with cord compression at C7-T1.  Patient status post prior C6-7 anterior cervical discectomy and fusion.  He presents now for C7-T1 anterior cervical decompression and fusion in hopes of improving his symptoms.  Past Medical History:  Diagnosis Date  . Atrial fibrillation ([place])    s/p afib ablation  PVI 5/12 JA  . CAD -nonobstructive    S/P nstemi-type II cath 05/2008 - nonobs. dzs.  . Cataract    left eye 2019  . DDD (degenerative disc disease)    evaluate by neurosurgery is ongoing  . Depression    ptsd  . Erectile dysfunction   . Headache(784.0)   . Hemorrhoids   . HTN (hypertension)   . Hyperlipemia   . Hyperthyroidism    [place] dz on methimazole and followed by [name]  . Myocardial infarction ([place])    2009  . Obesity   . Pancreatitis   . Polysubstance abuse ([place])    cocaine, last used 1999  . Presence of permanent cardiac pacemaker    since 2009  . PUD (peptic ulcer disease)    gastritis due to ETOH previously  . Pulmonary embolism ([place]) 04/2003   Hx of  . Rectal bleeding   . Sinus node dysfunction ([facility])    s/p MDT PPM - AAI - V port plugged  . Sleep apnea    uses CPAP [facility] 2 YRS  . Systolic and diastolic CHF, chronic ([place])    EF 35% by most recent echo 09/19/10  . 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 ANTERIOR CERVICAL DECOMP/DISCECTOMY FUSION  12/15/2011   Procedure: ANTERIOR CERVICAL DECOMPRESSION/DISCECTOMY FUSION 1 LEVEL;  Surgeon: [name], MD;  Location: [facility];  Service:  Neurosurgery;  Laterality: N/A;  Cervical Six-Seven Anterior Cervical Diskectomy fusion with Allograft and Plating  . ATRIAL ABLATION SURGERY  12/2010   afib ablation by [name]  . ATRIAL FIBRILLATION ABLATION N/A 05/06/2017   Procedure: Atrial Fibrillation Ablation;  Surgeon: Thompson Grayer, MD;  Location: [facility];  Service: Cardiovascular;  Laterality: N/A;  . BACK SURGERY    . CARDIAC CATHETERIZATION     CADIAC ABLATION [name] 12/2010  . CARDIAC CATHETERIZATION N/A 05/08/2016   Procedure: Left Heart Cath and Coronary Angiography;  Surgeon: Peter M Martinique, MD;  Location: [facility];  Service: Cardiovascular;  Laterality: N/A;  . Fingers removed from right hand.  2/84   traumatic work injury  . INSERT / REPLACE / REMOVE PACEMAKER     05/2008    . LUMBAR SPINE SURGERY  02/2009   Dr. Trenton Gammon  . PACEMAKER INSERTION  10/09   by Dr [name]  . TEE WITHOUT CARDIOVERSION N/A 05/05/2017   Procedure: TRANSESOPHAGEAL ECHOCARDIOGRAM (TEE);  Surgeon: Larey Dresser, MD;  [facility];  Service: Cardiovascular;  Laterality: N/A;    Family History  Problem Relation Age of Onset  . Heart disease Mother   . Breast cancer Mother   . Lung cancer Father   . Diabetes Other   . Colon  cancer Neg Hx   . Esophageal cancer Neg Hx   . Rectal cancer Neg Hx   . Stomach cancer Neg Hx    Social History:  reports that he quit smoking about 16 years ago. His smoking use included cigarettes. He has a 15.00 pack-year smoking history. He has never used smokeless tobacco. He reports that he does not drink alcohol or use drugs.  Allergies:  Allergies  Allergen Reactions  . Celecoxib Itching  . Hydrocodone Itching  . Prednisolone Itching  . Potassium Chloride Other (See Comments)    Dizziness and nausea     Medications Prior to Admission  Medication Sig Dispense Refill  . atorvastatin (LIPITOR) 40 MG tablet Take 40 mg by mouth daily.    . carvedilol (COREG) 25 MG tablet TAKE 1 TABLET  (25 MG TOTAL) BY MOUTH 2 (TWO) TIMES DAILY WITH A MEAL. 180 tablet 2  . cholecalciferol (VITAMIN D3) 25 MCG (1000 UNIT) tablet Take 1,000 Units by mouth daily.    . cyanocobalamin (,VITAMIN B-12,) 1000 MCG/ML injection Inject 1,000 mcg into the muscle See admin instructions. Start with 11mL intramuscular each day for 7 consecutive days, then 52mL intramuscular each week for 4 weeks, then 79mL intramuscular each     . desonide (DESOWEN) 0.05 % cream Apply 1 application topically 2 (two) times daily as needed (for rash).     . DEXILANT 60 MG capsule TAKE 1 CAPSULE BY MOUTH EVERY DAY 90 capsule 1  . dofetilide (TIKOSYN) 500 MCG capsule TAKE 1 CAPSULE (500 MCG TOTAL) BY MOUTH 2 (TWO) TIMES DAILY. 180 capsule 1  . ELIQUIS 5 MG TABS tablet TAKE 1 TABLET BY MOUTH TWICE A DAY 60 tablet 5  . gabapentin (NEURONTIN) 100 MG capsule Take 100-200 mg by mouth See admin instructions. Patient takes 1 tablet in the morning and 2 tablets at night     . ibuprofen (ADVIL,MOTRIN) 200 MG tablet Take 800 mg by mouth every 6 (six) hours as needed for headache (back pain).    . methimazole (TAPAZOLE) 10 MG tablet Take 1 tablet (10 mg total) by mouth daily. 30 tablet 1  . oxyCODONE-acetaminophen (PERCOCET) 10-325 MG per tablet Take 1 tablet by mouth every 4 (four) hours as needed for pain. 29 tablet 0  . triamcinolone ointment (KENALOG) 0.1 % Apply 1 application topically 2 (two) times daily as needed (for rash).   3  . sucralfate (CARAFATE) 1 GM/10ML suspension Take 10 mLs (1 g total) by mouth 4 (four) times daily. 420 mL 1    No results found for this or any previous visit (from the past 48 hour(s)). No results found.  Pertinent items noted in HPI and remainder of comprehensive ROS otherwise negative.  Blood pressure (!) 153/97, pulse 60, temperature 98.3 F (36.8 C), temperature source Oral, resp. rate 17, height 5\' 11"  (1.803 m), weight 129.3 kg, SpO2 100 %.  Patient is awake and alert.  He is oriented and appropriate.   Speech is fluent.  Judgment insight are intact.  Cranial nerve function normal bilateral.  Motor examination of the extremities limited secondary to prior finger loss bilaterally.  Patient with good deltoid, biceps and triceps function bilaterally.  Grips are somewhat diminished.  Intrinsic function is nonassessable.  Lower extremity strength has some increased tone and some incoordination otherwise normal.  Sensory examination with some patchy distal sensory loss in both upper extremities.  No evidence of long track signs.  Gait is unsteady.  Posture is normal peer examination head  ears eyes nose throat summer.  Chest and abdomen are benign.  Extremities are free from injury deformity aside from prior finger loss bilaterally. Assessment/Plan C7-T1 stenosis with anterolisthesis and spinal cord compression.  Plan C7-T1 anterior cervical discectomy and fusion.  Risks and benefits of been explained.  Patient wishes to proceed.  Mallie Mussel A [place] 01/01/2020, 7:55 AM

## 2020-01-01 NOTE — Progress Notes (Signed)
Orthopedic Tech Progress Note Patient Details:  [name] 1960/12/20 [id number] PACU RN called requesting a SOFT COLLAR for patient. dropped off at [facility] desk. PACU RN said patient would be up there shortly. Ortho Devices Type of Ortho Device: Soft collar Ortho Device/Splint Location: neck Ortho Device/Splint Interventions: Other (comment)   Post Interventions Patient Tolerated: Other (comment) Instructions Provided: Other (comment)   Janit Pagan 01/01/2020, 12:00 PM

## 2020-01-01 NOTE — Brief Op Note (Signed)
01/01/2020  9:53 AM  PATIENT:  [name]  59 y.o. male  PRE-OPERATIVE DIAGNOSIS:  Stenosis  POST-OPERATIVE DIAGNOSIS:  Stenosis  PROCEDURE:  Procedure(s) with comments: Anterior Cervical Discectomy Fusion - Cervical Seven- Thoracic One (N/A) - Anterior Cervical Discectomy Fusion - Cervical Seven- Thoracic One  SURGEON:  Surgeon(s) and Role:    Earnie Larsson, MD - Primary  PHYSICIAN ASSISTANT:   ASSISTANTSMearl Latin   ANESTHESIA:   general  EBL:  100 mL   BLOOD ADMINISTERED:none  DRAINS: (med) Hemovact drain(s) in the prevertebral space with  Suction Open   LOCAL MEDICATIONS USED:  NONE  SPECIMEN:  No Specimen  DISPOSITION OF SPECIMEN:  N/A  COUNTS:  YES  TOURNIQUET:  * No tourniquets in log *  DICTATION: .Dragon Dictation  PLAN OF CARE: Admit for overnight observation  PATIENT DISPOSITION:  PACU - hemodynamically stable.   Delay start of Pharmacological VTE agent (>24hrs) due to surgical blood loss or risk of bleeding: yes

## 2020-01-01 NOTE — Op Note (Signed)
Date of procedure: 01/01/2020  Date of dictation: Same  Service: Neurosurgery  Preoperative diagnosis: Cervical spondylosis with stenosis and myelopathy  Postoperative diagnosis: Same  Procedure Name: C7-T1 anterior cervical discectomy with interbody fusion utilizing interbody cage, locally harvested autograft, and anterior plate instrumentation  Surgeon:Luka Reisch A.Haylie Mccutcheon, M.D.  Asst. Surgeon: Reinaldo Meeker, NP  Anesthesia: General  Indication: 59 year old male status post remote C6-7 anterior cervical discectomy and fusion presents with worsening neck and bilateral upper extremity symptoms with increasing gait instability.  Work-up demonstrates evidence of marked disc degeneration with degenerative anterolisthesis of C7 on T1 with significant spinal stenosis with cord compression.  Patient presents now for decompression and fusion in hopes of improving his symptoms.  Operative note: After induction of anesthesia, patient positioned supine with neck slightly extended and held placed halter traction.  Patient's anterior cervical region prepped draped sterilely.  Incision made overlying C7 on the right.  Dissection performed on the right.  Retractor placed.  Previously placed C6-7 plate was dissected free.  This was disassembled.  Fusion at 6 7 was inspected and found to be solid.  Using the plate for localization the C7-T1 disc space was identified.  This patient then incised.  Discectomy then performed using various instruments down 12 the posterior annulus.  Microscope then brought to the field used throughout the remainder of the discectomy.  Remaining aspects annulus and osteophytes were removed using high-speed drill down to the level of the posterior longitudinal ligament.  Posterior logical was elevated and resected in piecemeal fashion.  Underlying thecal sac was identified.  Wide central decompression then performed undercutting the bodies of C7 and T1.  Decompression then proceeded into each neural  foramina.  Anterior foraminotomies were performed on the course exiting C8 nerve roots bilaterally.  At this point a very thorough decompression had been achieved.  There was no evidence of injury to the thecal sac or nerve roots.  Wound was then irrigated with antibiotic solution.  An 11 mm Medtronic anatomic peek cage packed with locally harvested autograft was then impacted into place and recessed slightly from the anterior cortical margin.  A 25 mm Atlantis anterior cervical weight was then placed over the C7 and T1 levels.  This then attached using 13 mm variable angle screws to each of both levels.  All screws given final tightening.  Locking screws were engaged.  Fluoroscopy was not used as the patient's body habitus precluded any hopes of getting meaningful information with intraoperative imaging.  Wound is then inspected for hemostasis which found to be good.  A medium Hemovac drain was left in the prevertebral space.  Wound is then closed in layers with Vicryl sutures.  Steri-Strips and sterile dressing were applied.  No apparent complications.  Patient tolerated the procedure well and he returned to the recovery room postop.

## 2020-01-02 ENCOUNTER — Encounter: Payer: Self-pay | Admitting: *Deleted

## 2020-01-02 DIAGNOSIS — M4712 Other spondylosis with myelopathy, cervical region: Secondary | ICD-10-CM | POA: Diagnosis not present

## 2020-01-02 MED ORDER — CYCLOBENZAPRINE HCL 10 MG PO TABS: 10.0000 mg | ORAL_TABLET | Freq: Three times a day (TID) | ORAL | 0 refills | Status: DC | PRN

## 2020-01-02 MED ORDER — GABAPENTIN 100 MG PO CAPS: 100.0000 mg | ORAL_CAPSULE | ORAL | 3 refills | Status: DC

## 2020-01-02 NOTE — Discharge Summary (Signed)
Physician Discharge Summary  Patient ID: [name] MRN: [id number] DOB/AGE: 1960/10/05 59 y.o.  Admit date: 01/01/2020 Discharge date: 01/02/2020  Admission Diagnoses:  Discharge Diagnoses:  Active Problems:   Cervical spondylosis with myelopathy and radiculopathy   Discharged Condition: good  Hospital Course: Patient admitted to the hospital where underwent uncomplicated [id number] anterior cervical discectomy and fusion.  Postoperatively doing well.  Preoperative neck upper and lower extremity symptoms much improved.  Standing ambulating and voiding without difficulty.  Patient ready for discharge home.  Consults:   Significant Diagnostic Studies:   Treatments:   Discharge Exam: Blood pressure 131/80, pulse 64, temperature 99 F (37.2 C), temperature source Oral, resp. rate 18, height 5\' 11"  (1.803 m), weight 129.3 kg, SpO2 98 %. Awake and alert.  Oriented and appropriate.  Motor and sensory function intact.  Wound clean and dry.  Voice strong.  Swallowing well.  Neck soft.  Disposition: Discharge disposition: 01-Home or Self Care        Allergies as of 01/02/2020      Reactions   Celecoxib Itching   Hydrocodone Itching   Prednisolone Itching   Potassium Chloride Other (See Comments)   Dizziness and nausea       Medication List    TAKE these medications   atorvastatin 40 MG tablet Commonly known as: LIPITOR Take 40 mg by mouth daily.   carvedilol 25 MG tablet Commonly known as: COREG TAKE 1 TABLET (25 MG TOTAL) BY MOUTH 2 (TWO) TIMES DAILY WITH A MEAL.   cholecalciferol 25 MCG (1000 UNIT) tablet Commonly known as: VITAMIN D3 Take 1,000 Units by mouth daily.   cyanocobalamin 1000 MCG/ML injection Commonly known as: (VITAMIN B-12) Inject 1,000 mcg into the muscle See admin instructions. Start with 63mL intramuscular each day for 7 consecutive days, then 69mL intramuscular each week for 4 weeks, then 67mL intramuscular each   cyclobenzaprine 10 MG  tablet Commonly known as: FLEXERIL Take 1 tablet (10 mg total) by mouth 3 (three) times daily as needed for muscle spasms.   desonide 0.05 % cream Commonly known as: DESOWEN Apply 1 application topically 2 (two) times daily as needed (for rash).   Dexilant 60 MG capsule Generic drug: dexlansoprazole TAKE 1 CAPSULE BY MOUTH EVERY DAY   dofetilide 500 MCG capsule Commonly known as: TIKOSYN TAKE 1 CAPSULE (500 MCG TOTAL) BY MOUTH 2 (TWO) TIMES DAILY.   Eliquis 5 MG Tabs tablet Generic drug: apixaban TAKE 1 TABLET BY MOUTH TWICE A DAY   gabapentin 100 MG capsule Commonly known as: NEURONTIN Take 1-2 capsules (100-200 mg total) by mouth See admin instructions. Patient takes 1 tablet in the morning and 2 tablets at night   ibuprofen 200 MG tablet Commonly known as: ADVIL Take 800 mg by mouth every 6 (six) hours as needed for headache (back pain).   methimazole 10 MG tablet Commonly known as: TAPAZOLE Take 1 tablet (10 mg total) by mouth daily.   oxyCODONE-acetaminophen 10-325 MG tablet Commonly known as: Percocet Take 1 tablet by mouth every 4 (four) hours as needed for pain.   triamcinolone ointment 0.1 % Commonly known as: KENALOG Apply 1 application topically 2 (two) times daily as needed (for rash).        Signed: Cooper Render Kellene Mccleary 01/02/2020, 8:17 AM

## 2020-01-02 NOTE — Discharge Instructions (Addendum)
Wound Care Keep incision area dry.  You may remove outer bandage after 2 days and shower.  Do not put any creams, lotions, or ointments on incision. Leave steri-strips on neck.  They will fall off by themselves. Activity Walk each and every day, increasing distance each day. No lifting greater than 5 lbs.  Avoid excessive neck motion. No driving for 2 weeks; may ride as a passenger locally. Wear neck brace at all times except when showering. Diet Resume your normal diet.  Return to Work Will be discussed at you follow up appointment. Call Your Doctor If Any of These Occur Redness, drainage, or swelling at the wound.  Temperature greater than 101 degrees. Severe pain not relieved by pain medication. Increased difficulty swallowing.  Incision starts to come apart. Follow Up Appt Call  [phone])  for problems.  If you have any hardware placed in your spine, you will need an x-ray before your appointment.  [place] [place]

## 2020-01-02 NOTE — Evaluation (Signed)
Occupational Therapy Evaluation Patient Details Name: [name] MRN: [id number] DOB: Oct 21, 1960 Today's Date: 01/02/2020    History of Present Illness Pt is a 59 y/o male with chronic and progressive neck pain presenting for C7-T1 cervical decompression and fusion. PMH signficant for but not limited to: HTN, CAD, L eye cataract, afib, MI, PE, CHF, obesity, prior ACDF, pacemaker.    Clinical Impression   PTA patient independent. Admitted for above and limited by neck pain.  Reviewed brace mgmt and wear schedule, precautions, ADL compensatory techniques, recommendations, DME and safety. Patient demonstrating ability to complete transfers, ADls and functional in room mobility with modified independence.  Educated on safety with tub transfers.  He reports he will have intermittent assist as needed.   Based on performance today, no further OT services identified and OT will sign off. Thank you for this referral.     Follow Up Recommendations  No OT follow up    Equipment Recommendations  None recommended by OT    Recommendations for Other Services       Precautions / Restrictions Precautions Precautions: Cervical Precaution Booklet Issued: Yes (comment) Precaution Comments: reviewed with pt  Required Braces or Orthoses: Cervical Brace Cervical Brace: Soft collar;At all times Restrictions Weight Bearing Restrictions: No      Mobility Bed Mobility Overal bed mobility: Modified Independent             General bed mobility comments: demonstrating log roll technique without cueing   Transfers Overall transfer level: Modified independent               General transfer comment: no assist required, good technique/body mechanics    Balance Overall balance assessment: No apparent balance deficits (not formally assessed)                                         ADL either performed or assessed with clinical judgement   ADL Overall ADL's : Modified  independent                                       General ADL Comments: pt demonstrates modified independent for full body dressing, transfers after education on compensatory techniques, safety and precautions      Vision         Perception     Praxis      Pertinent Vitals/Pain Pain Assessment: Faces Faces Pain Scale: Hurts a little bit Pain Location: neck Pain Descriptors / Indicators: Discomfort;Operative site guarding Pain Intervention(s): Monitored during session;Repositioned;RN gave pain meds during session     Hand Dominance Right   Extremity/Trunk Assessment Upper Extremity Assessment Upper Extremity Assessment: RUE deficits/detail;LUE deficits/detail RUE Deficits / Details: grossly 4/5 within precautions, hx of digits 2-5 PIP ampuation  RUE Sensation: WNL RUE Coordination: WNL LUE Deficits / Details: grossly 4/5 within precautions  LUE Sensation: WNL LUE Coordination: WNL   Lower Extremity Assessment Lower Extremity Assessment: Overall WFL for tasks assessed   Cervical / Trunk Assessment Cervical / Trunk Assessment: Other exceptions Cervical / Trunk Exceptions: s/p cervical sx   Communication Communication Communication: No difficulties   Cognition Arousal/Alertness: Awake/alert Behavior During Therapy: WFL for tasks assessed/performed Overall Cognitive Status: Within Functional Limits for tasks assessed  General Comments       Exercises     Shoulder Instructions      Home Living Family/patient expects to be discharged to:: Private residence Living Arrangements: Alone Available Help at Discharge: Friend(s);Available PRN/intermittently Type of Home: House Home Access: Stairs to enter [employer] of Steps: 2 Entrance Stairs-Rails: (+ rail) Home Layout: One level     Bathroom Shower/Tub: Teacher, early years/pre: Standard     Home Equipment: Environmental consultant - 2  wheels;Cane - single point;Bedside commode;Shower seat   Additional Comments: reports having equipment from mother who recently passed      Prior Functioning/Environment Level of Independence: Independent                 OT Problem List: Decreased knowledge of precautions;Pain      OT Treatment/Interventions:      OT Goals(Current goals can be found in the care plan section) Acute Rehab OT Goals Patient Stated Goal: home today OT Goal Formulation: With patient  OT Frequency:     Barriers to D/C:            Co-evaluation              AM-PAC OT "6 Clicks" Daily Activity     Outcome Measure Help from another person eating meals?: None Help from another person taking care of personal grooming?: None Help from another person toileting, which includes using toliet, bedpan, or urinal?: None Help from another person bathing (including washing, rinsing, drying)?: None Help from another person to put on and taking off regular upper body clothing?: None Help from another person to put on and taking off regular lower body clothing?: None 6 Click Score: 24   End of Session Equipment Utilized During Treatment: Cervical collar Nurse Communication: Mobility status  Activity Tolerance: Patient tolerated treatment well Patient left: with call bell/phone within reach;Other (comment)(seated EOB )  OT Visit Diagnosis: Pain Pain - part of body: (neck s/p surgery)                Time: [id number] OT Time Calculation (min): 12 min Charges:  OT General Charges $OT Visit: 1 Visit OT Evaluation $OT Eval Low Complexity: 1 Low  [name], OT Acute Rehabilitation Services Pager [phone] Office [phone]    Delight Stare 01/02/2020, 9:00 AM

## 2020-01-02 NOTE — Plan of Care (Signed)
Patient alert and oriented, mae's well, voiding adequate amount of urine, swallowing without difficulty, no c/o pain at time of discharge. Patient discharged home with family. Script and discharged instructions given to patient. Patient and family stated understanding of instructions given. Patient has an appointment with Dr. Pool  

## 2020-01-02 NOTE — Anesthesia Postprocedure Evaluation (Signed)
Anesthesia Post Note  Patient: [name]  Procedure(s) Performed: Anterior Cervical Discectomy Fusion - Cervical Seven- Thoracic One (N/A )     Patient location during evaluation: PACU Anesthesia Type: General Level of consciousness: awake and alert Pain management: pain level controlled Vital Signs Assessment: post-procedure vital signs reviewed and stable Respiratory status: spontaneous breathing, nonlabored ventilation, respiratory function stable and patient connected to nasal cannula oxygen Cardiovascular status: blood pressure returned to baseline and stable Postop Assessment: no apparent nausea or vomiting Anesthetic complications: no    Last Vitals:  Vitals:   01/02/20 0345 01/02/20 0752  BP: (!) 161/93 131/80  Pulse: 73 64  Resp: 20 18  Temp: 37.1 C 37.2 C  SpO2: 95% 98%    Last Pain:  Vitals:   01/02/20 0752  TempSrc: Oral  PainSc:                  [place] S

## 2020-01-12 IMAGING — CT CT CERVICAL SPINE W/O CM
3 of 4 series · 13 of 33 positions shown, 16 images · non-contrast
Comparison: 01/04/2016 CT myelogram

CLINICAL DATA: Cervical pain

EXAM:
CT CERVICAL SPINE WITHOUT CONTRAST
TECHNIQUE: Multidetector CT imaging of the cervical spine was performed without
intravenous contrast. Multiplanar CT image reconstructions were also
generated.

[Series 3: c-spine 2.00 br60 s3 axial bone · axial · 0.32mm/px · z∈[-701,-573]mm · 5 of 96 slices shown, 7 images]
[im 16/96  soft-tissue]
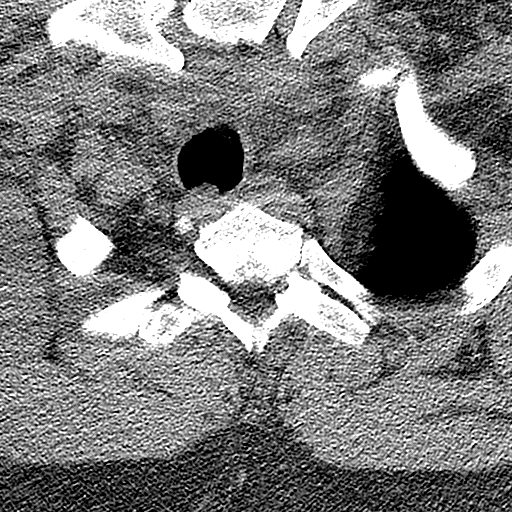
[im 16/96  bone]
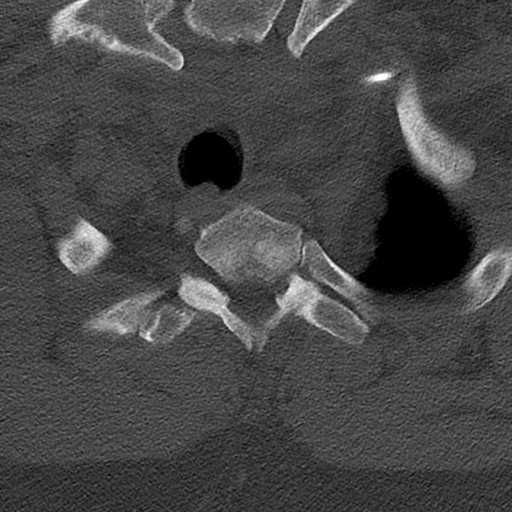
[im 32/96  bone]
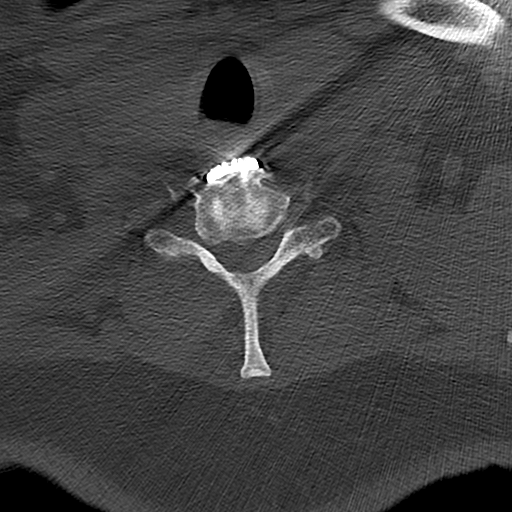
[im 48/96  bone]
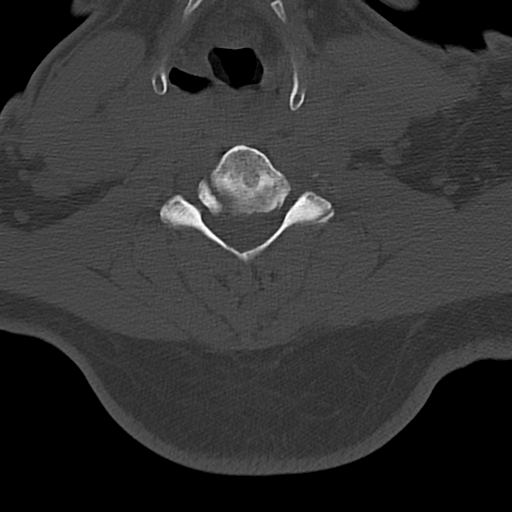
[im 64/96  bone]
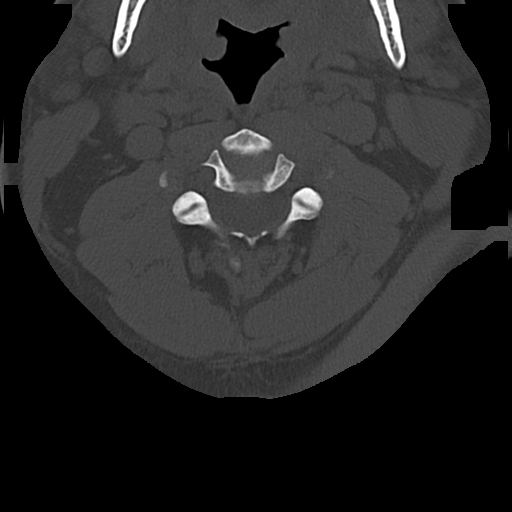
[im 80/96  soft-tissue]
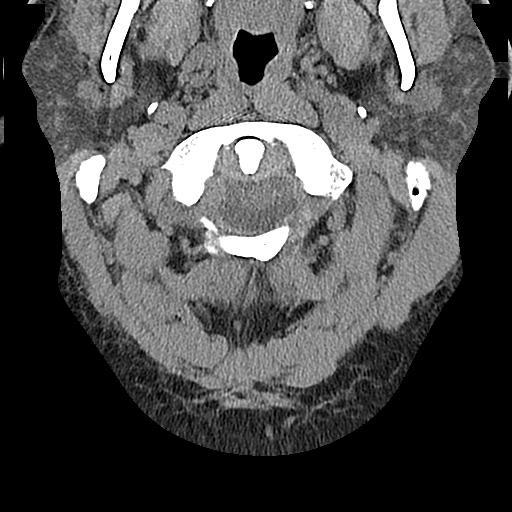
[im 80/96  bone]
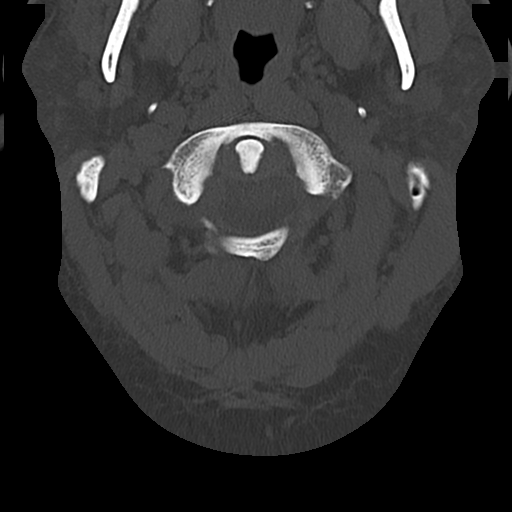

[Series 5: c-spine 2.00 br60 s3 sag bone · sagittal · 0.32mm/px · 5 of 73 slices shown, 6 images]
[im 25/73  bone]
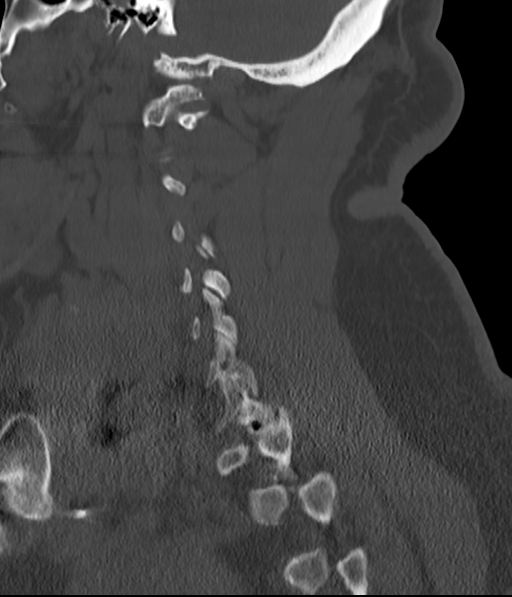
[im 31/73  bone]
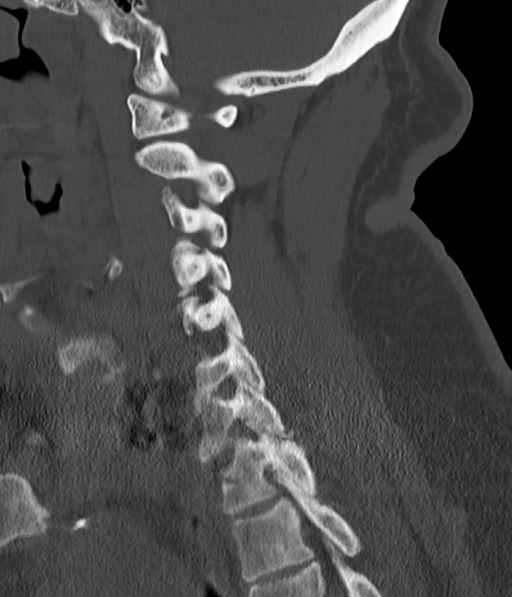
[im 37/73  soft-tissue]
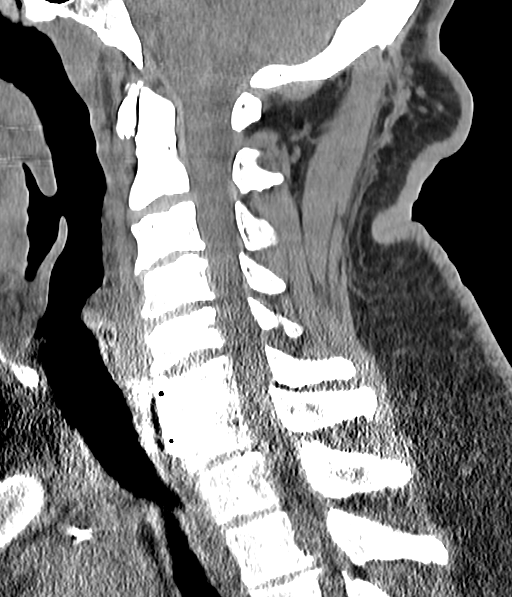
[im 37/73  bone]
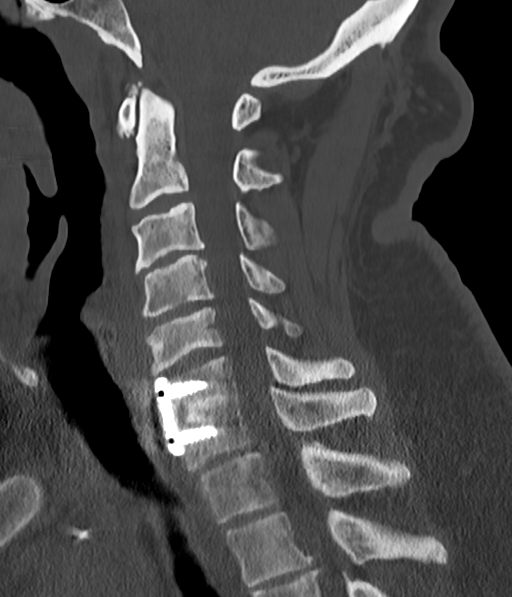
[im 43/73  bone]
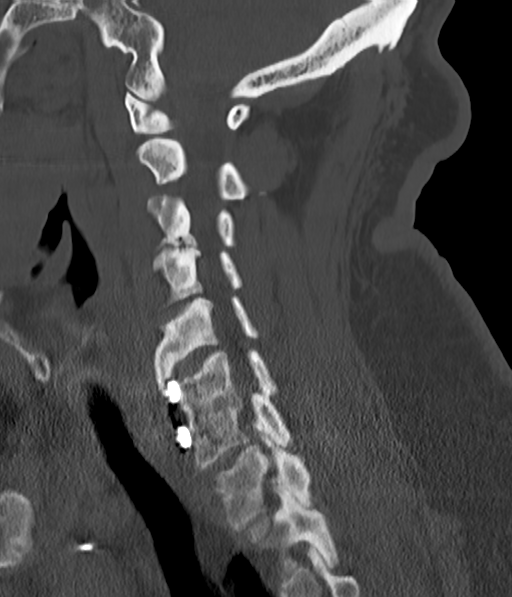
[im 49/73  bone]
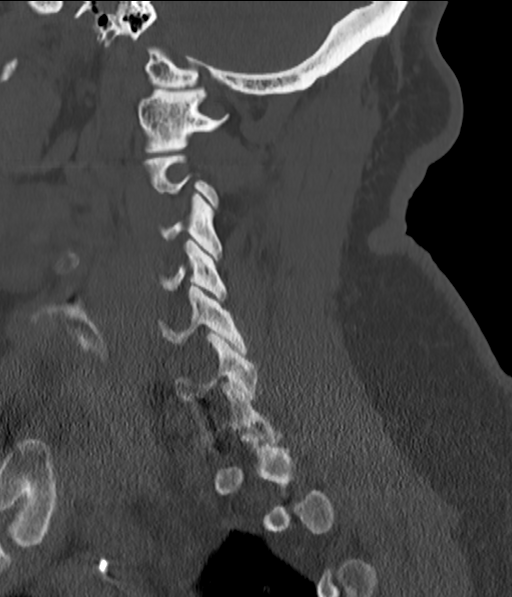

[Series 7: c-spine 2.00 hr60 s3 cor bone · coronal · 0.29mm/px · 3 of 82 slices shown]
[im 17/82  bone]
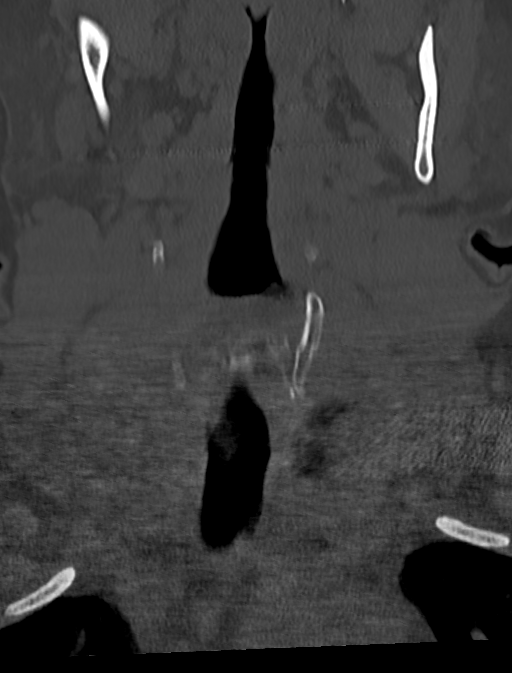
[im 33/82  bone]
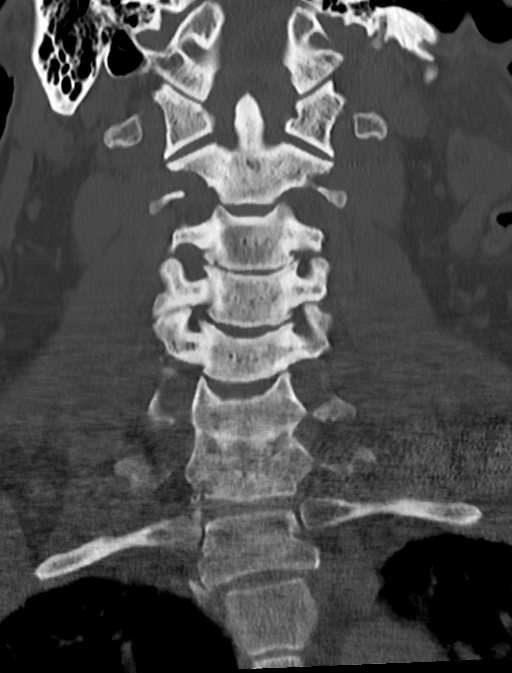
[im 49/82  bone]
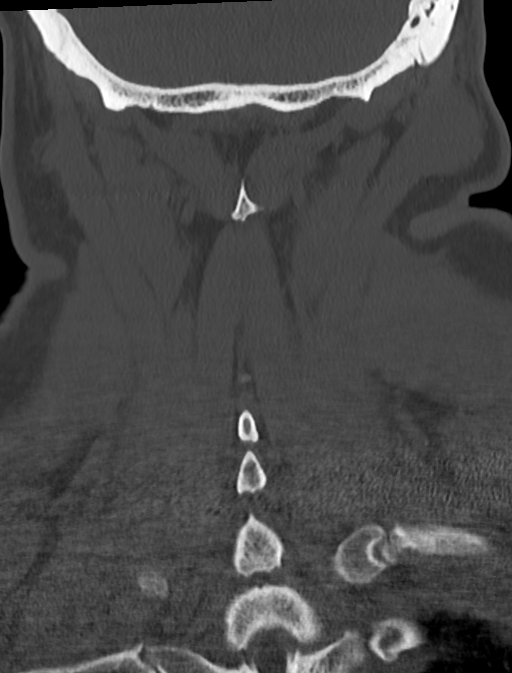

[13 of 33 positions shown; findings below may reference images not displayed]

FINDINGS: Alignment: 2 mm of anterolisthesis at C7-T1.

Skull base and vertebrae: C6-7 ACDF with solid arthrodesis. No
fracture or aggressive bone lesion.

Soft tissues and spinal canal: No evidence of mass or adenopathy.
Goiter without macroscopic/dominant nodule by noncontrast technique

Disc levels:

C2-3: Unremarkable.

C3-4: Disc narrowing with endplate and uncovertebral ridging. Left
foraminal impingement. Spinal stenosis in the left para median canal
that is likely mild

C4-5: Disc narrowing with mild uncovertebral ridging. Mild bilateral
foraminal narrowing. Patent spinal canal

C5-6: Ventral spondylitic spurring. Patent appearance of the canal
and foramina

C6-7: ACDF without evidence of residual bony impingement

C7-T1:Facet osteoarthritis with spurring and joint distortion. This
has progressed from prior and there is right foraminal stenosis.
Suspect a central disc protrusion and spinal stenosis with possible
cord impingement.

T1-2: Facet spurring with biforaminal impingement greater on the
left. There may also be cord encroachment by the left-sided spur.

Upper chest: No acute finding
IMPRESSION: 1. C7-T1 advanced facet arthropathy with anterolisthesis and
progression from 8872 myelogram. There is possibly a superimposed
disc protrusion and spinal stenosis that could affect the cord.
Moderate right foraminal narrowing.
2. T1-2 facet osteoarthritis with asymmetric left-sided spurring
narrowing the foramen and possibly contacting the cord.
3. C3-4 degenerative left foraminal impingement.
4. C6-7 ACDF with solid arthrodesis.
5. Goiter

## 2020-01-31 ENCOUNTER — Telehealth: Payer: Self-pay | Admitting: Gastroenterology

## 2020-01-31 MED ORDER — DEXILANT 60 MG PO CPDR: 60.0000 mg | DELAYED_RELEASE_CAPSULE | Freq: Every day | ORAL | 0 refills | Status: DC

## 2020-01-31 NOTE — Telephone Encounter (Signed)
Called patient.  [facility] a [facility] day [facility] [address]. [facility] [address].  I called and [facility] to [facility] [address] to see if they would transfer the script.  Their system was down on 4-5 due to an accident so they did not receive the script.  I will resend to [place].

## 2020-01-31 NOTE — Telephone Encounter (Signed)
Requesting refill on Dexilant also questioned why he has to call every time for this refill

## 2020-02-21 ENCOUNTER — Other Ambulatory Visit: Payer: Self-pay

## 2020-02-21 ENCOUNTER — Emergency Department (HOSPITAL_BASED_OUTPATIENT_CLINIC_OR_DEPARTMENT_OTHER)
Admission: EM | Admit: 2020-02-21 | Discharge: 2020-02-21 | Disposition: A | Discharge status: Home / Self Care | Payer: Medicaid Other | Attending: Emergency Medicine | Admitting: Emergency Medicine

## 2020-02-21 ENCOUNTER — Encounter (HOSPITAL_BASED_OUTPATIENT_CLINIC_OR_DEPARTMENT_OTHER): Payer: Self-pay

## 2020-02-21 ENCOUNTER — Other Ambulatory Visit: Payer: Self-pay | Admitting: Neurosurgery

## 2020-02-21 DIAGNOSIS — F1910 Other psychoactive substance abuse, uncomplicated: Secondary | ICD-10-CM | POA: Insufficient documentation

## 2020-02-21 DIAGNOSIS — Z885 Allergy status to narcotic agent status: Secondary | ICD-10-CM | POA: Diagnosis not present

## 2020-02-21 DIAGNOSIS — I2510 Atherosclerotic heart disease of native coronary artery without angina pectoris: Secondary | ICD-10-CM | POA: Insufficient documentation

## 2020-02-21 DIAGNOSIS — E039 Hypothyroidism, unspecified: Secondary | ICD-10-CM | POA: Insufficient documentation

## 2020-02-21 DIAGNOSIS — Z886 Allergy status to analgesic agent status: Secondary | ICD-10-CM | POA: Diagnosis not present

## 2020-02-21 DIAGNOSIS — H6123 Impacted cerumen, bilateral: Secondary | ICD-10-CM

## 2020-02-21 DIAGNOSIS — H9393 Unspecified disorder of ear, bilateral: Secondary | ICD-10-CM | POA: Diagnosis present

## 2020-02-21 DIAGNOSIS — Z79899 Other long term (current) drug therapy: Secondary | ICD-10-CM | POA: Diagnosis not present

## 2020-02-21 DIAGNOSIS — I110 Hypertensive heart disease with heart failure: Secondary | ICD-10-CM | POA: Diagnosis not present

## 2020-02-21 DIAGNOSIS — I5042 Chronic combined systolic (congestive) and diastolic (congestive) heart failure: Secondary | ICD-10-CM | POA: Diagnosis not present

## 2020-02-21 DIAGNOSIS — Z950 Presence of cardiac pacemaker: Secondary | ICD-10-CM | POA: Diagnosis not present

## 2020-02-21 DIAGNOSIS — Z87891 Personal history of nicotine dependence: Secondary | ICD-10-CM | POA: Diagnosis not present

## 2020-02-21 DIAGNOSIS — M48061 Spinal stenosis, lumbar region without neurogenic claudication: Secondary | ICD-10-CM

## 2020-02-21 MED ORDER — CARBAMIDE PEROXIDE 6.5 % OT SOLN
10.0000 [drp] | Freq: Once | OTIC | Status: DC
  Filled 2020-02-21: qty 15

## 2020-02-21 NOTE — ED Triage Notes (Signed)
Pt states he feels like both ears are "stopped up" c/o started 1-2 weeks ago-states he has been using ear drops without relief-also c/o pain to left side of face-NAD-steady gait

## 2020-02-21 NOTE — Discharge Instructions (Addendum)
Please continue to use Debrox drops on your ears.  You can buy kits in the pharmacy to irrigate your ears.  Please asked the pharmacist if you have difficulty finding it.  Please follow-up with your primary care provider regarding her symptoms.  I have also given you referral to ENT if you find your symptoms do not improve in the next week.  You can always return to the emergency department with new or worsening symptoms.  It was a pleasure to meet you.

## 2020-02-21 NOTE — ED Provider Notes (Signed)
[facility] EMERGENCY DEPARTMENT Provider Note   CSN: [id number] Arrival date & time: 02/21/20  1634     History Chief Complaint  Patient presents with   Ear Fullness    [name] is a 59 y.o. male.  HPI Patient is a 59 year old male with medical history as noted below.  Patient states [place] the beach and started to feel that his "ears were stopped up".  Initially started with his right ear.  States he went to the pharmacy and started "applying drops" but is unsure of what kind.  This morning he states after using Q-tips he can "no longer hear out of his left ear".  He reports mild irritation in the left ear.  Denies any drainage.  No fevers, chills.    Past Medical History:  Diagnosis Date   Atrial fibrillation [facility])    s/p afib ablation  PVI 5/12 JA   CAD -nonobstructive    S/P nstemi-type II cath 05/2008 - nonobs. dzs.   Cataract    left eye 2019   DDD (degenerative disc disease)    evaluate by neurosurgery is ongoing   Depression    ptsd   Erectile dysfunction    Headache(784.0)    Hemorrhoids    HTN (hypertension)    Hyperlipemia    Hyperthyroidism    Graves dz on methimazole and followed by [name]   Myocardial infarction [facility])    2009   Obesity    Pancreatitis    Polysubstance abuse ([place])    cocaine, last used 1999   Presence of permanent cardiac pacemaker    since 2009   PUD (peptic ulcer disease)    gastritis due to ETOH previously   Pulmonary embolism ([place]) 04/2003   Hx of   Rectal bleeding    Sinus node dysfunction (HCC)    s/p MDT PPM - AAI - V port plugged   Sleep apnea    uses CPAP [facility] 2 YRS   Systolic and diastolic CHF, chronic (HCC)    EF 35% by most recent echo 09/19/10   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   recent ly had tooth pulled painful    Patient Active Problem List   Diagnosis Date Noted   Cervical spondylosis with myelopathy and radiculopathy 01/01/2020   Acute maxillary  sinusitis 09/12/2018   Paroxysmal atrial fibrillation ([place]) 05/06/2017   Acute on chronic diastolic congestive heart failure, NYHA class 4 ([place]) 04/03/2017   Back pain 11/12/2015   Scrotal varicose veins 07/11/2015   Varicocele 07/11/2015   High risk medication use - Tikosyn 09/16/2014   Chronic anticoagulation-Pradaxa 09/13/2014   Muscle twitch 09/11/2014   Leg cramps 09/11/2014   Irritant contact dermatitis 10/03/2013   Eunuchoidism 10/03/2013   Hypogonadism male 10/03/2013   Mixed, or nondependent drug abuse, in remission ([place]) 09/13/2013   Substance abuse in remission ([place]) 09/13/2013   Spinal stenosis, lumbar region, with neurogenic claudication 05/23/2013   CAD -nonobstructive    Preoperative cardiovascular examination 11/17/2012   Pulmonary embolism ([place]) 09/02/2012   Acid reflux 09/02/2012   Sinus node dysfunction ([place]) 09/02/2012   Adiposity 02/12/2012   Obesity 02/12/2012   Cervical disc herniation 12/15/2011   Allergic rhinitis 09/21/2011   Atrial fibrillation with RVR ([place]) 07/08/2011   Discomfort 06/19/2011   Male erectile dysfunction 54/27/0623   Lichen planus 76/28/3151   Decreased libido 06/19/2011   Testicular discomfort 06/19/2011   Essential hypertension 04/14/2011   Dermatitis, eczematoid 02/26/2011   Eczema  02/26/2011   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   Degeneration of intervertebral disc of lumbosacral region 01/06/2011   Chronic atrial fibrillation ([place]) 01/06/2011   DDD (degenerative disc disease), lumbosacral 01/06/2011   Graves disease 10/30/2010   CARPAL TUNNEL SYNDROME, LEFT 10/16/2010   SICK SINUS/ TACHY-BRADY SYNDROME 10/16/2010   Ischemic cardiomyopathy 09/21/2010   HLD (hyperlipidemia) 09/16/2010   PPM-Medtronic- 2009 06/27/2010   DYSPNEA 06/06/2010   Abnormal cardiac CT angiography 06/06/2010   Seasonal and perennial allergic rhinitis 02/05/2010   OTHER CHEST PAIN 11/27/2009   ERECTILE  DYSFUNCTION, ORGANIC 07/18/2009   Obstructive sleep apnea- on C-pap 07/08/2009   COMBINED HEART FAILURE, CHRONIC 05/30/2009   LUMBAR RADICULOPATHY, RIGHT 11/05/2008   HIATAL HERNIA 07/04/2008   HYPERTHYROIDISM 08/30/2007   Dyslipidemia 08/30/2007   ALCOHOL ABUSE 08/30/2007   DEPRESSION 08/30/2007   PAF (paroxysmal atrial fibrillation) ([place]) 08/30/2007   GERD (gastroesophageal reflux disease) 08/30/2007   PULMONARY EMBOLISM, HX OF 08/30/2007   DRUG ABUSE, HX OF 08/30/2007    Past Surgical History:  Procedure Laterality Date   ANTERIOR CERVICAL DECOMP/DISCECTOMY FUSION  12/15/2011   Procedure: ANTERIOR CERVICAL DECOMPRESSION/DISCECTOMY FUSION 1 LEVEL;  Surgeon: [name], MD;  Location: [facility];  Service: Neurosurgery;  Laterality: N/A;  Cervical Six-Seven Anterior Cervical Diskectomy fusion with Allograft and Plating   ANTERIOR CERVICAL DECOMP/DISCECTOMY FUSION N/A 01/01/2020   Procedure: Anterior Cervical Discectomy Fusion - Cervical Seven- Thoracic One;  Surgeon: Earnie Larsson, MD;  Location: [facility];  Service: Neurosurgery;  Laterality: N/A;  Anterior Cervical Discectomy Fusion - Cervical Seven- Thoracic One   ATRIAL ABLATION SURGERY  12/2010   afib ablation by [name]   ATRIAL FIBRILLATION ABLATION N/A 05/06/2017   Procedure: Atrial Fibrillation Ablation;  Surgeon: Thompson Grayer, MD;  Location: [facility];  Service: Cardiovascular;  Laterality: N/A;   BACK SURGERY     CARDIAC CATHETERIZATION     CADIAC ABLATION [name] 12/2010   CARDIAC CATHETERIZATION N/A 05/08/2016   Procedure: Left Heart Cath and Coronary Angiography;  Surgeon: Peter M Martinique, MD;  Location: [facility];  Service: Cardiovascular;  Laterality: N/A;   Fingers removed from right hand.  2/84   traumatic work injury   INSERT / REPLACE / REMOVE PACEMAKER     05/2008     LUMBAR SPINE SURGERY  02/2009   Dr. Trenton Gammon   PACEMAKER INSERTION  10/09   by Dr [name]   TEE WITHOUT  CARDIOVERSION N/A 05/05/2017   Procedure: TRANSESOPHAGEAL ECHOCARDIOGRAM (TEE);  Surgeon: Larey Dresser, MD;  Location: [facility];  Service: Cardiovascular;  Laterality: N/A;       Family History  Problem Relation Age of Onset   Heart disease Mother    Breast cancer Mother    Lung cancer Father    Diabetes Other    Colon cancer Neg Hx    Esophageal cancer Neg Hx    Rectal cancer Neg Hx    Stomach cancer Neg Hx     Social History   Tobacco Use   Smoking status: Former Smoker    Packs/day: 1.00    Years: 15.00    Pack years: 15.00    Types: Cigarettes    Quit date: 04/04/2003    Years since quitting: 16.8   Smokeless tobacco: Never Used  Vaping Use   Vaping Use: Never used  Substance Use Topics   Alcohol use: No    Comment: formerly heavy ETOH,  no alcohol in 20 years   Drug  use: No    Comment: previously used crack cocaine, quit 1999,  + marijuana    Home Medications Prior to Admission medications   Medication Sig Start Date End Date Taking? Authorizing Provider  cyanocobalamin (,VITAMIN B-12,) 1000 MCG/ML injection Inject into the muscle. 12/18/19  Yes [provider]  cyclobenzaprine (FLEXERIL) 10 MG tablet Take by mouth. 01/02/20  Yes [provider]  gabapentin (NEURONTIN) 100 MG capsule TAKE 1 CAPSULE BY MOUTH IN THE MORNING, AND TAKE 2 CAPSULES BY MOUTH AT BEDTIME. 01/08/20  Yes [provider]  atorvastatin (LIPITOR) 40 MG tablet Take 40 mg by mouth daily.    [provider]  carvedilol (COREG) 25 MG tablet TAKE 1 TABLET (25 MG TOTAL) BY MOUTH 2 (TWO) TIMES DAILY WITH A MEAL. 12/19/19   Allred, Jeneen Rinks, MD  cholecalciferol (VITAMIN D3) 25 MCG (1000 UNIT) tablet Take 1,000 Units by mouth daily.    [provider]  cyanocobalamin (,VITAMIN B-12,) 1000 MCG/ML injection Inject 1,000 mcg into the muscle See admin instructions. Start with 12mL intramuscular each day for 7 consecutive days, then 81mL intramuscular each  week for 4 weeks, then 29mL intramuscular each  12/25/19   [provider]  cyclobenzaprine (FLEXERIL) 10 MG tablet Take 1 tablet (10 mg total) by mouth 3 (three) times daily as needed for muscle spasms. 01/02/20   Earnie Larsson, MD  D3-50 1.25 MG (50000 UT) capsule Take 50,000 Units by mouth once a week. 12/27/19   [provider]  desonide (DESOWEN) 0.05 % cream Apply 1 application topically 2 (two) times daily as needed (for rash).     [provider]  dexlansoprazole (DEXILANT) 60 MG capsule Take 1 capsule (60 mg total) by mouth daily. You will be due for a yearly follow up in Sept 2021. Please call in August to schedule. Thank you 01/31/20   Armbruster, Carlota Raspberry, MD  dofetilide (TIKOSYN) 500 MCG capsule TAKE 1 CAPSULE (500 MCG TOTAL) BY MOUTH 2 (TWO) TIMES DAILY. 12/01/19   Deboraha Sprang, MD  ELIQUIS 5 MG TABS tablet TAKE 1 TABLET BY MOUTH TWICE A DAY 10/03/19   Allred, Jeneen Rinks, MD  gabapentin (NEURONTIN) 100 MG capsule Take 1-2 capsules (100-200 mg total) by mouth See admin instructions. Patient takes 1 tablet in the morning and 2 tablets at night 01/02/20   Earnie Larsson, MD  ibuprofen (ADVIL,MOTRIN) 200 MG tablet Take 800 mg by mouth every 6 (six) hours as needed for headache (back pain).    [provider]  methimazole (TAPAZOLE) 10 MG tablet Take 1 tablet (10 mg total) by mouth daily. 11/06/14   Renato Shin, MD  oxyCODONE-acetaminophen (PERCOCET) 10-325 MG per tablet Take 1 tablet by mouth every 4 (four) hours as needed for pain. 05/29/13   Antonietta Breach, PA-C  triamcinolone ointment (KENALOG) 0.1 % Apply 1 application topically 2 (two) times daily as needed (for rash).  02/24/17   [provider]    Allergies    Celecoxib, Hydrocodone, Prednisolone, and Potassium chloride  Review of Systems   Review of Systems  Constitutional: Negative for chills and fever.  HENT: Positive for ear pain and hearing loss. Negative for congestion, ear discharge, rhinorrhea and  sore throat.    Physical Exam Updated Vital Signs BP (!) 161/99 (BP Location: Left Arm)    Pulse 60    Temp 98.2 F (36.8 C) (Oral)    Resp 20    Ht 5\' 11"  (1.803 m)    Wt 130.2 kg  SpO2 100%    BMI 40.03 kg/m   Physical Exam Vitals and nursing note reviewed.  Constitutional:      General: He is not in acute distress.    Appearance: He is well-developed.  HENT:     Head: Normocephalic and atraumatic.     Right Ear: External ear normal. There is impacted cerumen.     Left Ear: External ear normal. There is impacted cerumen.  Eyes:     General: No scleral icterus.       Right eye: No discharge.        Left eye: No discharge.     Conjunctiva/sclera: Conjunctivae normal.  Neck:     Trachea: No tracheal deviation.  Cardiovascular:     Rate and Rhythm: Normal rate.  Pulmonary:     Effort: Pulmonary effort is normal. No respiratory distress.     Breath sounds: No stridor.  Abdominal:     General: There is no distension.  Musculoskeletal:        General: No swelling or deformity.     Cervical back: Neck supple.  Skin:    General: Skin is warm and dry.     Findings: No rash.  Neurological:     Mental Status: He is alert.     Cranial Nerves: Cranial nerve deficit: no gross deficits.     ED Results / Procedures / Treatments   Labs (all labs ordered are listed, but only abnormal results are displayed) Labs Reviewed - No data to display  EKG None  Radiology No results found.  Procedures Procedures (including critical care time)  Medications Ordered in ED Medications  carbamide peroxide (DEBROX) 6.5 % OTIC (EAR) solution 10 drop (has no administration in time range)    ED Course  I have reviewed the triage vital signs and the nursing notes.  Pertinent labs & imaging results that were available during my care of the patient were reviewed by me and considered in my medical decision making (see chart for details).    MDM Rules/Calculators/A&P                           Patient is a 59 year old male who presents due to left greater than right bilateral cerumen impaction.  Physical exam otherwise reassuring.  No signs or symptoms of infection.  Afebrile.  Not tachycardic.  Patient states he was using a Q-tip prior to the onset of his symptoms.  Physical exam significant for bilateral cerumen impaction.  I had the nursing staff apply Debrox drops and irrigate both ears significantly.  They are unable to remove the cerumen.  I visualized the bilateral EACs and the impaction is near the TM, thus making it difficult to remove with a curette.  I am going to discharge the patient with instructions to continue with the Debrox drops daily.  He has a PCP in the area.  I recommended that he follow-up with his PCP.  He was also given ENT follow-up if he finds his symptoms do not improve.  We discussed continuing to irrigate his ears daily with saline.  Discontinued use of Q-tips.  His questions were answered and he was amicable the time of discharge.  His vital signs are stable.  Patient discharged to home/self care.  Condition at discharge: Stable  Note: Portions of this report may have been transcribed using voice recognition software. Every effort was made to ensure accuracy; however, inadvertent computerized transcription errors may be present.  Final Clinical Impression(s) / ED Diagnoses Final diagnoses:  Bilateral impacted cerumen    Rx / DC Orders ED Discharge Orders    None       Rayna Sexton, PA-C 02/21/20 2136    Drenda Freeze, MD 02/21/20 [phone]

## 2020-02-21 NOTE — ED Notes (Signed)
Irrigated patient ear per provider order. Unable to see wax upon irrigation. Spoke with provider for further evaluation.

## 2020-03-08 ENCOUNTER — Ambulatory Visit
Admission: RE | Admit: 2020-03-08 | Discharge: 2020-03-08 | Disposition: A | Discharge status: Home / Self Care | Payer: Medicaid Other | Source: Ambulatory Visit | Attending: Neurosurgery | Admitting: Neurosurgery

## 2020-03-08 DIAGNOSIS — M48061 Spinal stenosis, lumbar region without neurogenic claudication: Secondary | ICD-10-CM

## 2020-03-11 ENCOUNTER — Ambulatory Visit (INDEPENDENT_AMBULATORY_CARE_PROVIDER_SITE_OTHER): Payer: Medicaid Other | Admitting: *Deleted

## 2020-03-11 DIAGNOSIS — I480 Paroxysmal atrial fibrillation: Secondary | ICD-10-CM

## 2020-03-13 LAB — CUP PACEART REMOTE DEVICE CHECK
Battery Impedance: 1336 Ohm
Battery Remaining Longevity: 70 mo
Battery Voltage: 2.77 V
Brady Statistic RA Percent Paced: 5 %
Date Time Interrogation Session: 20210713164231
Implantable Lead Implant Date: 20091023
Implantable Lead Location: 753859
Implantable Lead Model: 5076
Implantable Pulse Generator Implant Date: 20091023
Lead Channel Impedance Value: 445 Ohm
Lead Channel Impedance Value: 67 Ohm
Lead Channel Setting Pacing Amplitude: 2 V

## 2020-03-13 NOTE — Progress Notes (Signed)
Remote pacemaker transmission.   

## 2020-03-25 ENCOUNTER — Other Ambulatory Visit: Payer: Self-pay | Admitting: Gastroenterology

## 2020-05-02 ENCOUNTER — Other Ambulatory Visit: Payer: Self-pay | Admitting: Internal Medicine

## 2020-05-02 NOTE — Telephone Encounter (Signed)
Pt last saw Barrington Ellison, [place] on 12/26/19, last labs 12/27/19 Creat 1.00, age 59, weight 130.2kg, baseed on specified criteria pt is on appropriate dosage of Eliquis 5mg  BID.  Will refill rx.

## 2020-05-20 ENCOUNTER — Other Ambulatory Visit: Payer: Self-pay

## 2020-05-20 ENCOUNTER — Encounter: Payer: Self-pay | Admitting: Internal Medicine

## 2020-05-20 ENCOUNTER — Ambulatory Visit: Payer: Medicaid Other | Admitting: Internal Medicine

## 2020-05-20 VITALS — BP 116/80 | HR 75 | Temp 98.3°F | Ht 71.0 in | Wt 280.2 lb

## 2020-05-20 DIAGNOSIS — G4733 Obstructive sleep apnea (adult) (pediatric): Secondary | ICD-10-CM

## 2020-05-20 DIAGNOSIS — I5042 Chronic combined systolic (congestive) and diastolic (congestive) heart failure: Secondary | ICD-10-CM | POA: Diagnosis not present

## 2020-05-20 NOTE — Progress Notes (Signed)
HPI   male former smoker followed for OSA, complicated by CAD/CHF/P A. fib/pacemaker/Pradaxa, allergic rhinitis, hyperthyroid,  degenerative disc disease, GERD NPSG 2010- AHI 21/ hr  ---------------------------------------------------------------------------------------   05/18/2019- 59 year old male former smoker followed for OSA, complicated by CAD/CHF/P A. fib/pacemaker/Pradaxa, allergic rhinitis, hyperthyroid, degenerative disc disease, GERD, Hx PE 2004 CPAP auto 5-15/[facility]  Covid Neg 9/8 -----Follows for: OSA Body weight today 284 lbs Download compliance 97%, AHI 2.3/ hr Nasal congestion, drainage and cough- clear mucus, no fever, Covid neg last week.. He blames his CPAP machine- discussed filter and humidifier. Eligible to replace in 2012.  For 2 weeks has had diffuse pains constant in neck, upper back, upper chest. Hx C-spine fusion. He thinks he pulled too hard doing work.   05/20/20-  59 year old male former smoker followed for OSA, complicated by CAD/CHF/P A. fib/pacemaker/Pradaxa, allergic rhinitis, hyperthyroid, degenerative disc disease, GERD, Hx PE 2004 CPAP auto 5-15/[facility]/ [facility]  Download- compliance 100%, AHI 2.6/ hr Body weight today- 280 lbs Covid vax- Flu vax- 2 Phizer Flu vax- declined Two or three nights/ week he has to wipe out mouth for excess saliva. He asks if it is from the CPAP. No evident postnasal drip, reflux. Possibly related to orthostatic fluid shifts given his CHF. Following recommended CPAP cleaning.  CXR- 05/18/19- IMPRESSION: 1. No acute cardio pulmonary abnormalities. 2. Low lung volumes.  ROS-see HPI   + = positive Constitutional:    weight loss, night sweats, fevers, chills, fatigue, lassitude. HEENT:    headaches, difficulty swallowing, tooth/dental problems, sore throat,       sneezing, itching, ear ache, +nasal congestion, post nasal drip, snoring CV:    +chest pain, orthopnea, PND, swelling in lower extremities, anasarca,                                                     dizziness, palpitations Resp:   +shortness of breath with exertion or at rest.                +productive cough,   non-productive cough, coughing up of blood.              change in color of mucus.  wheezing.   Skin:    rash or lesions. GI:  No-   heartburn, indigestion, abdominal pain, nausea, vomiting GU: . MS:   joint pain, stiffness,  Neuro-     nothing unusual Psych:  change in mood or affect.  depression or anxiety.   memory loss.  OBJ- Physical Exam General- Alert, Oriented, Affect-appropriate, Distress- none acute, + obese Skin- rash-none, lesions- none, excoriation- none Lymphadenopathy- none Head- atraumatic            Eyes- Gross vision intact, PERRLA, conjunctivae and secretions clear            Ears- Hearing, canals-normal            Nose- , + turbinate edema R, no-Septal dev, mucus, polyps, erosion, perforation             Throat- Mallampati III-IV , mucosa clear/not dry , drainage- none, tonsils- atrophic Neck- flexible , trachea midline, no stridor , thyroid nl, carotid no bruit Chest - symmetrical excursion , unlabored           Heart/CV- RRR , no murmur , no gallop  , no rub, nl  s1 s2                           - JVD- none , edema- none, stasis changes- none, varices- none           Lung- clear to P&A, wheeze- none, cough- none , dullness-none, rub- none           Chest wall- L pacemaker Abd-  Br/ Gen/ Rectal- Not done, not indicated Extrem- cyanosis- none, clubbing, none, atrophy- none, strength- nl. +Missing distal fingers R hand. Neuro- grossly intact to observation

## 2020-05-20 NOTE — Patient Instructions (Signed)
Order- DME [facility]- please replace old CPAP machine when eligible, auto 5-15, mask of choice, humidifier, supplies, AirView/ card  Suggest you try otc chlortrimeton antihistamine at bedtime. See if it helps with the dry mouth problem  Please call if we can help

## 2020-05-28 NOTE — Assessment & Plan Note (Signed)
Benefits from CPAP. I suspect the excess drooling reflects his CHF, rather than a problem with his CPAP. However, machine is old. Plan- replace old CPAP machine auto 5-15

## 2020-05-28 NOTE — Assessment & Plan Note (Signed)
" >>  ASSESSMENT AND PLAN FOR COMBINED HEART FAILURE, CHRONIC WRITTEN ON 05/28/2020  4:04 PM BY Ferdinando Lodge D, MD  He continues to follow with cardiology. "

## 2020-05-28 NOTE — Assessment & Plan Note (Signed)
He continues to follow with cardiology.

## 2020-07-01 ENCOUNTER — Ambulatory Visit (INDEPENDENT_AMBULATORY_CARE_PROVIDER_SITE_OTHER): Payer: Medicaid Other

## 2020-07-01 DIAGNOSIS — I480 Paroxysmal atrial fibrillation: Secondary | ICD-10-CM

## 2020-07-02 LAB — CUP PACEART REMOTE DEVICE CHECK
Battery Impedance: 1417 Ohm
Battery Remaining Longevity: 67 mo
Battery Voltage: 2.76 V
Brady Statistic RA Percent Paced: 6 %
Date Time Interrogation Session: 20211031140038
Implantable Lead Implant Date: 20091023
Implantable Lead Location: 753859
Implantable Lead Model: 5076
Implantable Pulse Generator Implant Date: 20091023
Lead Channel Impedance Value: 461 Ohm
Lead Channel Impedance Value: 67 Ohm
Lead Channel Setting Pacing Amplitude: 2 V

## 2020-07-03 NOTE — Progress Notes (Signed)
Remote pacemaker transmission.   

## 2020-07-15 ENCOUNTER — Other Ambulatory Visit: Payer: Self-pay | Admitting: Gastroenterology

## 2020-08-01 ENCOUNTER — Telehealth: Payer: Self-pay | Admitting: Internal Medicine

## 2020-08-01 NOTE — Telephone Encounter (Signed)
Called and spoke with patient, he states he was told previously by [facility] that he was not eligible for a new CPAP machine because it had not been 5 years.  He has been using a loaner for 2 years and is wondering why he has not received his new machine.  I advised him that the order had been sent to [facility] (on the day of his last office visit (05/20/20).  Advised him I would contact [facility] and find out the status of the machine.  I called [facility] and was told they are waiting on a resmed machine.  They are starting to come in, it may be January, but she is not sure.  Stated that if we chose another brand that he could get it sooner, they would just need a new prescription.  Called patient back, advised him that [facility] is waiting for a machine to become available and it may be January due to the shortage of CPAP machines, but could not confirm that for sure.  Advised him that as soon as one is available, they will contact him.  He verbalized understanding.  Nothing further needed.

## 2020-08-02 ENCOUNTER — Other Ambulatory Visit: Payer: Self-pay | Admitting: Gastroenterology

## 2020-08-05 ENCOUNTER — Telehealth: Payer: Self-pay | Admitting: Gastroenterology

## 2020-08-05 ENCOUNTER — Other Ambulatory Visit: Payer: Self-pay | Admitting: Gastroenterology

## 2020-08-05 NOTE — Telephone Encounter (Signed)
[facility] [address]  Patient needs to keep his January appt for further refills.

## 2020-08-05 NOTE — Telephone Encounter (Signed)
Patient made an appointment for 09/24/2020 at 0:10am for rx refill but is requesting [facility] [place] since he is completely out of medication.

## 2020-08-15 ENCOUNTER — Telehealth: Payer: Self-pay | Admitting: Internal Medicine

## 2020-08-15 MED ORDER — DOFETILIDE 500 MCG PO CAPS
500.0000 ug | ORAL_CAPSULE | Freq: Two times a day (BID) | ORAL | 1 refills | Status: DC
Start: 2020-08-15 — End: 2020-10-25

## 2020-08-15 NOTE — Telephone Encounter (Signed)
Pt's medication was sent to pt's pharmacy as requested. Confirmation received.  °

## 2020-08-15 NOTE — Telephone Encounter (Signed)
   *  STAT* If patient is at the pharmacy, call can be transferred to refill team.   1. Which medications need to be refilled? (please list name of each medication and dose if known) dofetilide (TIKOSYN) 500 MCG capsule  2. Which pharmacy/location (including street and city if local pharmacy) is medication [facility] sent to[facility] [address] [place], [place] [address]  3. Do they need a 30 day or 90 day supply? 90 days   Pt would like to get a cb to confirm refill is sent. He only have 1 pill left

## 2020-09-12 ENCOUNTER — Other Ambulatory Visit: Payer: Self-pay | Admitting: Neurosurgery

## 2020-09-13 ENCOUNTER — Telehealth: Payer: Self-pay | Admitting: *Deleted

## 2020-09-13 NOTE — Telephone Encounter (Signed)
   [facility] Pre-operative Risk Assessment    HEARTCARE STAFF: - Please ensure there is not already an duplicate clearance open for this procedure. - Under Visit Info/Reason for Call, type in Other and utilize the format Clearance MM/DD/YY or Clearance TBD. Do not use dashes or single digits. - If request is for dental extraction, please clarify the # of teeth to be extracted.  Request for surgical clearance:  1. What type of surgery is being performed?  L3-4 LUMBAR FUSION   2. When is this surgery scheduled?  10/04/2020   3. What type of clearance is required (medical clearance vs. Pharmacy clearance to hold med vs. Both)?  BOTH  4. Are there any medications that need to be held prior to surgery and how long? ELIQUIS   5. Practice name and name of physician performing surgery?  [facility]   6. What is the office phone number?  [phone]   7.   What is the office fax number?  [phone]  8.   Anesthesia type (None, local, MAC, general) ?  GENERAL   Jeanann Lewandowsky 09/13/2020, 11:19 AM  _________________________________________________________________   (provider comments below)

## 2020-09-13 NOTE — Telephone Encounter (Signed)
Patient was instructed to hold Eliquis for 3 days in April 2021 for a similar procedure.  We will verify with clinical pharmacist that recommendation has not changed.

## 2020-09-13 NOTE — Telephone Encounter (Signed)
   Primary Cardiologist: Thompson Grayer, MD  Chart reviewed as part of pre-operative protocol coverage. Because of Montrell Cessna Gallus's past medical history and time since last visit, he will require a follow-up visit in order to better assess preoperative cardiovascular risk.  Pre-op covering staff: - Please schedule appointment and call patient to inform them. If patient already had an upcoming appointment within acceptable timeframe, please add "pre-op clearance" to the appointment notes so provider is aware. - Please contact requesting surgeon's office via preferred method (i.e, phone, fax) to inform them of need for appointment prior to surgery.  If applicable, this message will also be routed to pharmacy pool and/or primary cardiologist for input on holding anticoagulant/antiplatelet agent as requested below so that this information is available to the clearing provider at time of patient's appointment.   Almyra Deforest, [place]  09/13/2020, 6:36 PM

## 2020-09-13 NOTE — Telephone Encounter (Signed)
Patient with diagnosis of afib on Eliquis for anticoagulation.    Procedure: L3-4 LUMBAR FUSION Date of procedure: 10/04/20    CHA2DS2-VASc Score = 3  This indicates a 3.2% annual risk of stroke. The patient's score is based upon: CHF History: Yes HTN History: Yes Diabetes History: No Stroke History: No Vascular Disease History: Yes Age Score: 0 Gender Score: 0      CrCl 105 ml/min  Per office protocol, patient can hold Eliquis for 3 days prior to procedure.

## 2020-09-18 NOTE — Telephone Encounter (Signed)
Pt has appt 09/19/20 with Chanetta Marshall, NP. I will forward clearance notes to NP for upcoming appt.

## 2020-09-19 ENCOUNTER — Encounter: Payer: Self-pay | Admitting: Nurse Practitioner

## 2020-09-19 ENCOUNTER — Ambulatory Visit: Payer: Medicaid Other | Admitting: Nurse Practitioner

## 2020-09-19 ENCOUNTER — Other Ambulatory Visit: Payer: Self-pay

## 2020-09-19 VITALS — BP 104/78 | HR 74 | Ht 71.0 in | Wt 281.4 lb

## 2020-09-19 DIAGNOSIS — I4819 Other persistent atrial fibrillation: Secondary | ICD-10-CM | POA: Diagnosis not present

## 2020-09-19 DIAGNOSIS — Z01818 Encounter for other preprocedural examination: Secondary | ICD-10-CM | POA: Diagnosis not present

## 2020-09-19 DIAGNOSIS — I495 Sick sinus syndrome: Secondary | ICD-10-CM

## 2020-09-19 LAB — BASIC METABOLIC PANEL
BUN/Creatinine Ratio: 15 (ref 10–24)
BUN: 16 mg/dL (ref 8–27)
CO2: 22 mmol/L (ref 20–29)
Calcium: 9.5 mg/dL (ref 8.6–10.2)
Chloride: 103 mmol/L (ref 96–106)
Creatinine, Ser: 1.1 mg/dL (ref 0.76–1.27)
GFR calc Af Amer: 83 mL/min/{1.73_m2} (ref >59)
GFR calc non Af Amer: 72 mL/min/{1.73_m2} (ref >59)
Glucose: 109 mg/dL — ABNORMAL HIGH (ref 65–99)
Potassium: 4.6 mmol/L (ref 3.5–5.2)
Sodium: 140 mmol/L (ref 134–144)

## 2020-09-19 LAB — MAGNESIUM: Magnesium: 2.1 mg/dL (ref 1.6–2.3)

## 2020-09-19 NOTE — Progress Notes (Signed)
Electrophysiology Office Note Date: 09/19/2020  ID:  [name], DOB 04/03/1961, MRN [id number]  PCP: [facility] [place] Electrophysiologist: [place]  CC: Pacemaker follow-up/ surgical clearance  [name] is a 60 y.o. male seen today for [name].  He presents today for routine electrophysiology followup.  Since last being seen in our clinic, the patient reports doing very well. He has not had any noticeable AF.  He has lost his mom and 75 year old daughter in the last year.  He denies chest pain, palpitations, dyspnea, PND, orthopnea, nausea, vomiting, dizziness, syncope, edema, weight gain, or early satiety.  Device History: MDT dual chamber PPM implanted 2009 for sinus node dysfunction    Past Medical History:  Diagnosis Date  . Atrial fibrillation ([place])    s/p afib ablation  PVI 5/12 JA  . CAD -nonobstructive    S/P nstemi-type II cath 05/2008 - nonobs. dzs.  . Cataract    left eye 2019  . DDD (degenerative disc disease)    evaluate by neurosurgery is ongoing  . Depression    ptsd  . Erectile dysfunction   . Headache(784.0)   . Hemorrhoids   . HTN (hypertension)   . Hyperlipemia   . Hyperthyroidism    Graves dz on methimazole and followed by [name]  . Myocardial infarction ([place])    2009  . Obesity   . Pancreatitis   . Polysubstance abuse ([place])    cocaine, last used 1999  . Presence of permanent cardiac pacemaker    since 2009  . PUD (peptic ulcer disease)    gastritis due to ETOH previously  . Pulmonary embolism ([place]) 04/2003   Hx of  . Rectal bleeding   . Sinus node dysfunction ([facility])    s/p MDT PPM - AAI - V port plugged  . Sleep apnea    uses CPAP [facility] 2 YRS  . Systolic and diastolic CHF, chronic ([place])    EF 35% by most recent echo 09/19/10  . 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 ANTERIOR CERVICAL DECOMP/DISCECTOMY FUSION  12/15/2011    Procedure: ANTERIOR CERVICAL DECOMPRESSION/DISCECTOMY FUSION 1 LEVEL;  Surgeon: [name], MD;  Location: [facility];  Service: Neurosurgery;  Laterality: N/A;  Cervical Six-Seven Anterior Cervical Diskectomy fusion with Allograft and Plating  . ANTERIOR CERVICAL DECOMP/DISCECTOMY FUSION N/A 01/01/2020   Procedure: Anterior Cervical Discectomy Fusion - Cervical Seven- Thoracic One;  Surgeon: Earnie Larsson, MD;  Location: [facility];  Service: Neurosurgery;  Laterality: N/A;  Anterior Cervical Discectomy Fusion - Cervical Seven- Thoracic One  . ATRIAL ABLATION SURGERY  12/2010   afib ablation by [name]  . ATRIAL FIBRILLATION ABLATION N/A 05/06/2017   Procedure: Atrial Fibrillation Ablation;  Surgeon: Thompson Grayer, MD;  Location: [facility];  Service: Cardiovascular;  Laterality: N/A;  . BACK SURGERY    . CARDIAC CATHETERIZATION     CADIAC ABLATION [name] 12/2010  . CARDIAC CATHETERIZATION N/A 05/08/2016   Procedure: Left Heart Cath and Coronary Angiography;  Surgeon: Peter M Martinique, MD;  Location: [facility];  Service: Cardiovascular;  Laterality: N/A;  . Fingers removed from right hand.  2/84   traumatic work injury  . INSERT / REPLACE / REMOVE PACEMAKER     05/2008    . LUMBAR SPINE SURGERY  02/2009   Dr. Trenton Gammon  . PACEMAKER INSERTION  10/09   by Dr [name]  . TEE WITHOUT CARDIOVERSION N/A 05/05/2017   Procedure: TRANSESOPHAGEAL ECHOCARDIOGRAM (TEE);  Surgeon: Larey Dresser, MD;  Location: [facility];  Service: Cardiovascular;  Laterality: N/A;    Current Outpatient Medications  Medication Sig Dispense Refill  . atorvastatin (LIPITOR) 40 MG tablet Take 40 mg by mouth daily.    . carvedilol (COREG) 25 MG tablet TAKE 1 TABLET (25 MG TOTAL) BY MOUTH 2 (TWO) TIMES DAILY WITH A MEAL. 180 tablet 2  . cholecalciferol (VITAMIN D3) 25 MCG (1000 UNIT) tablet Take 1,000 Units by mouth daily.    . cyanocobalamin (,VITAMIN B-12,) 1000 MCG/ML injection Inject 1,000 mcg into the muscle  See admin instructions. Start with 81mL intramuscular each day for 7 consecutive days, then 23mL intramuscular each week for 4 weeks, then 68mL intramuscular each     . D3-50 1.25 MG (50000 UT) capsule Take 50,000 Units by mouth once a week.    . desonide (DESOWEN) 0.05 % cream Apply 1 application topically 2 (two) times daily as needed (for rash).     [name] dexlansoprazole (DEXILANT) 60 MG capsule Take 1 capsule (60 mg total) by mouth daily. Please keep your January appt for further refills. Thank you 90 capsule 0  . dofetilide (TIKOSYN) 500 MCG capsule Take 1 capsule (500 mcg total) by mouth 2 (two) times daily. 180 capsule 1  . ELIQUIS 5 MG TABS tablet TAKE 1 TABLET BY MOUTH TWICE A DAY 60 tablet 5  . gabapentin (NEURONTIN) 100 MG capsule Take 1-2 capsules (100-200 mg total) by mouth See admin instructions. Patient takes 1 tablet in the morning and 2 tablets at night 60 capsule 3  . gabapentin (NEURONTIN) 100 MG capsule TAKE 1 CAPSULE BY MOUTH IN THE MORNING, AND TAKE 2 CAPSULES BY MOUTH AT BEDTIME.    [name] ibuprofen (ADVIL,MOTRIN) 200 MG tablet Take 800 mg by mouth every 6 (six) hours as needed for headache (back pain).    . methimazole (TAPAZOLE) 10 MG tablet Take 1 tablet (10 mg total) by mouth daily. 30 tablet 1  . oxyCODONE-acetaminophen (PERCOCET) 10-325 MG per tablet Take 1 tablet by mouth every 4 (four) hours as needed for pain. 29 tablet 0  . triamcinolone ointment (KENALOG) 0.1 % Apply 1 application topically 2 (two) times daily as needed (for rash).   3   No current facility-administered medications for this visit.    Allergies:   Celecoxib, Hydrocodone, Prednisolone, and Potassium chloride   Social History: Social History   Socioeconomic History  . Marital status: Legally Separated    Spouse name: Not on file  . Number of children: 7  . Years of education: Not on file  . Highest education level: Not on file  Occupational History  . Occupation: unemployed    Fish farm manager: UNEMPLOYED   Tobacco Use  . Smoking status: Former Smoker    Packs/day: 1.00    Years: 15.00    Pack years: 15.00    Types: Cigarettes    Quit date: 04/04/2003    Years since quitting: 17.4  . Smokeless tobacco: Never Used  Vaping Use  . Vaping Use: Never used  Substance and Sexual Activity  . Alcohol use: No    Comment: formerly heavy ETOH,  no alcohol in 20 years  . Drug use: No    Comment: previously used crack cocaine, quit 1999,  + marijuana  . Sexual activity: Yes    Birth control/protection: Condom  Other Topics Concern  . Not  on file  Social History Narrative   Unemployed currently.  Previously worked as a Dealer.  Lives in [place].   Social Determinants of Health   Financial Resource Strain: Not on file  Food Insecurity: Not on file  Transportation Needs: Not on file  Physical Activity: Not on file  Stress: Not on file  Social Connections: Not on file  Intimate Partner Violence: Not on file    Family History: Family History  Problem Relation Age of Onset  . Heart disease Mother   . Breast cancer Mother   . Lung cancer Father   . Diabetes Other   . Colon cancer Neg Hx   . Esophageal cancer Neg Hx   . Rectal cancer Neg Hx   . Stomach cancer Neg Hx      Review of Systems: All other systems reviewed and are otherwise negative except as noted above.   Physical Exam: VS:  BP 104/78   Pulse 74   Ht 5\' 11"  (1.803 m)   Wt 281 lb 6.4 oz (127.6 kg)   SpO2 95%   BMI 39.25 kg/m  , BMI Body mass index is 39.25 kg/m.  GEN- The patient is well appearing, alert and oriented x 3 today.   HEENT: normocephalic, atraumatic; sclera clear, conjunctiva pink; hearing intact; oropharynx clear; neck supple  Lungs- Clear to ausculation bilaterally, normal work of breathing.  No wheezes, rales, rhonchi Heart- Regular rate and rhythm, no murmurs, rubs or gallops  GI- soft, non-tender, non-distended, bowel sounds present  Extremities- no clubbing, cyanosis, or edema  MS- no  significant deformity or atrophy Skin- warm and dry, no rash or lesion; PPM pocket well healed Psych- euthymic mood, full affect Neuro- strength and sensation are intact  PPM Interrogation- reviewed in detail today,  See PACEART report  EKG:  EKG is ordered today. The ekg ordered today shows sinus rhythm, QTc 444 msec  Recent Labs: 12/26/2019: ALT 23; Magnesium 2.1 12/27/2019: BUN 14; Creatinine, Ser 1.00; Hemoglobin 14.5; Platelets 243; Potassium 4.6; Sodium 141   Wt Readings from Last 3 Encounters:  09/19/20 281 lb 6.4 oz (127.6 kg)  05/20/20 280 lb 3.2 oz (127.1 kg)  02/21/20 287 lb (130.2 kg)     Other studies Reviewed: Additional studies/ records that were reviewed today include: office notes  Assessment and Plan:  1.  Symptomatic bradycardia Normal PPM function See Pace Art report No changes today  2.  Persistent atrial fibrillation AF burden by device interrogation <0.1% Continue Tikosyn, EKG stable today BMET, Mg today Per pharmacy, ok to hold Eliquis for 3 days prior to surgery  3.  CAD, non obstructive No recent ischemic symptoms Continue current plan  4.  Cardiac clearance Scheduled for back surgery  10/04/20 with Dr Annette Stable EF 35-40% by echo 05/2017 LHC with non obstructive CAD 2017 He is cleared for surgery from cardiac perspective with at least moderate risk    Current medicines are reviewed at length with the patient today.   The patient does not have concerns regarding his medicines.  The following changes were made today:  none  Labs/ tests ordered today include: BMET, Mg No orders of the defined types were placed in this encounter.    Disposition:   Follow up with [facility], [name] 1 year     Signed, Chanetta Marshall, NP 09/19/2020 11:37 AM  [address] [place] [address] [phone] (office) [phone] (fax)

## 2020-09-19 NOTE — Patient Instructions (Addendum)
Medication Instructions:  *If you need a refill on your cardiac medications before your next appointment, please call your pharmacy*  Labs; Your physician has recommended that you have lab work today: BMET and Magnesium Level  Follow-Up: At [facility], you and your health needs are our priority.  As part of our continuing mission to provide you with exceptional heart care, we have created designated Provider Care Teams.  These Care Teams include your primary Cardiologist (physician) and Advanced Practice Providers (APPs -  Physician Assistants and Nurse Practitioners) who all work together to provide you with the care you need, when you need it.  We recommend signing up for the patient portal called "MyChart".  Sign up information is provided on this After Visit Summary.  MyChart is used to connect with patients for Virtual Visits (Telemedicine).  Patients are able to view lab/test results, encounter notes, upcoming appointments, etc.  Non-urgent messages can be sent to your provider as well.   To learn more about what you can do with MyChart, go to [email or web address].    Your next appointment:   Your physician wants you to follow-up in: 1 YEAR with Dr. Rayann Heman. You will receive a reminder letter in the mail two months in advance. If you don't receive a letter, please call our office to schedule the follow-up appointment.  Remote monitoring is used to monitor your Pacemaker from home. This monitoring reduces the number of office visits required to check your device to one time per year. It allows [name] to keep an eye on the functioning of your device to ensure it is working properly. You are scheduled for a device check from home on 09/30/20. You may send your transmission at any time that day. If you have a wireless device, the transmission will be sent automatically. After your physician reviews your transmission, you will receive a postcard with your next transmission date.  The format for  your next appointment:   In Person with Thompson Grayer, MD

## 2020-09-20 ENCOUNTER — Telehealth: Payer: Self-pay

## 2020-09-20 NOTE — Telephone Encounter (Signed)
-----   Message from Patsey Berthold, NP sent at 09/19/2020  5:37 PM EST ----- Please notify patient of stable labs. Thanks!

## 2020-09-20 NOTE — Telephone Encounter (Signed)
Pt is aware and agreeable to stable labs.  

## 2020-09-24 ENCOUNTER — Encounter: Payer: Self-pay | Admitting: Gastroenterology

## 2020-09-24 ENCOUNTER — Ambulatory Visit: Payer: Medicaid Other | Admitting: Gastroenterology

## 2020-09-24 ENCOUNTER — Other Ambulatory Visit: Payer: Self-pay

## 2020-09-24 VITALS — BP 138/74 | HR 68 | Ht 71.0 in | Wt 285.8 lb

## 2020-09-24 DIAGNOSIS — K219 Gastro-esophageal reflux disease without esophagitis: Secondary | ICD-10-CM

## 2020-09-24 DIAGNOSIS — Z860100 Personal history of colon polyps, unspecified: Secondary | ICD-10-CM

## 2020-09-24 DIAGNOSIS — D132 Benign neoplasm of duodenum: Secondary | ICD-10-CM

## 2020-09-24 DIAGNOSIS — K9049 Malabsorption due to intolerance, not elsewhere classified: Secondary | ICD-10-CM

## 2020-09-24 MED ORDER — DEXLANSOPRAZOLE 60 MG PO CPDR: 60.0000 mg | DELAYED_RELEASE_CAPSULE | Freq: Every day | ORAL | 3 refills | Status: DC

## 2020-09-24 NOTE — Progress Notes (Signed)
HPI :  60 y/o maleistory of AF, CAD,CHF,PE,OSA, pacemakerin place, onEliquis, here for a follow up visit for GERD.  I have seen him in the past for reflux.  He has had longstanding symptoms of typical pyrosis.  He has previously failed higher dose omeprazole dosed at 40 mg twice daily.  He has since been on Dexilant since 2019 dosed at 60 mg a day.  He states this works really well for him and he is quite happy with the regimen.  If he misses a dose he will have quick recurrence of symptoms however if he takes it every day he has no breakthrough and does well.  He has no dysphagia.  No nausea or vomiting.  No abdominal pains.  He denies any problems of kidney disease or bone fractures.  We discussed long-term risk benefits of the regimen.  He has had an endoscopic work-up as outlined below.  He does have a 3 cm hiatal hernia.  Patient also has a history of duodenal adenomas.  Last EGD October 2020 showed no residual or recurrent adenomas.  He also has a history of advanced colonic adenoma initially in July 2019.  Rather difficult polyps removed due to looping, he had a follow-up colonoscopy 1 year later which showed 3 small adenomas.  Generally he is feeling well without significant complaints today.  He does have intolerance to dairy, specifically milk will cause him to have loose stools.  He enjoys eating cereal and this bothers him when he eats cereal and generally tries to avoid it.  He inquires about other options.  He is not really tried any other milk products or options yet.    Prior workup: Colonoscopy 01/22/2010 - hemorrhoids, otherwise normal EGD 11/02/2007 - distal esophagitis  TEE echo 05/05/2017 - EF 35-40%   EGD 03/02/2018 - A 2 cm hiatal hernia was present. - The exam of the esophagus was otherwise normal. No Barrett's - A single 4 mm sessile polyp was found in the gastric antrum. Biopsies were taken with a cold forceps for histology. - The exam of the stomach was otherwise  normal. - A single 4 mm sessile polyp was found in the second portion of the duodenum in the sweep at angulated turn and quite difficult to visualize initially. The polyp was removed with a cold biopsy forceps given its location. Resection and retrieval were thought to have been complete. - A single 7 mm sessile polyp was found in the third portion of the duodenum. The polyp was removed with a hot snare. Resection and retrieval were complete. Some oozing occurred post polypectomy. To prevent bleeding after the polypectomy in light of the patient's Eliquis use, two hemostatic clips were successfully placed. (one other clip misfired, and another clip that was used was defective) - The exam of the duodenum was otherwise normal.  Colonoscopy 03/02/2018 - Redundant colon with significant looping. - One 12 mm polyp in the ascending colon, removed with a cold snare. Resected and retrieved. - One 4 mm polyp in the transverse colon, removed with a cold snare. Resected and retrieved. - One 3 mm polyp at the recto-sigmoid colon, removed with a cold snare. Resected and retrieved. - Internal hemorrhoids. - The examination was otherwise normal.  Path shows one duodenal adenoma and colonic adenomas    EGD 06/14/19 -  - A 3 cm hiatal hernia was present. - The exam of the esophagus was otherwise normal. - There were benign appearing hypertrophied folds around the pylorus - previously  biopsied and no significant pathology noted, no further biopsies taken today. The examined stomach was otherwise normal. - The duodenal bulb and second portion of the duodenum were normal. No polyps noted  Repeat EGD in 3-5 years  Colonoscopy 06/14/19 - The perianal and digital rectal examinations were normal. - Two sessile polyps were found in the ascending colon. The polyps were 3 to 4 mm in size. The smaller lesion was noted at the site of the prior large ascending colon polypectomy site. These polyps were  removed with a cold snare. Resection and retrieval were complete. - A 3 to 4 mm polyp was found in the transverse colon. The polyp was sessile. The polyp was removed with a cold snare. Resection and retrieval were complete. - Internal hemorrhoids were found during retroflexion. - The colon revealed excessive looping in the right colon. Prep in the right colon was fair but following lavage adequate views were obtained. - The exam was otherwise without abnormality.  Surgical [P], colon, ascending and transverse, polyp (4) - TUBULAR ADENOMA(S). - SESSILE SERRATED POLYP WITHOUT CYTOLOGIC DYSPLASIA. - HIGH GRADE DYSPLASIA IS NOT IDENTIFIED.  Repeat colon in 3 years   Past Medical History:  Diagnosis Date  . Atrial fibrillation ([place])    s/p afib ablation  PVI 5/12 JA  . CAD -nonobstructive    S/P nstemi-type II cath 05/2008 - nonobs. dzs.  . Cataract    left eye 2019  . Colon polyps   . DDD (degenerative disc disease)    evaluate by neurosurgery is ongoing  . Depression    ptsd  . Duodenal adenoma   . Erectile dysfunction   . GERD (gastroesophageal reflux disease)   . Headache(784.0)   . Hemorrhoids   . HTN (hypertension)   . Hyperlipemia   . Hyperthyroidism    Graves dz on methimazole and followed by [name]  . Myocardial infarction ([place])    2009  . Obesity   . Pancreatitis   . Polysubstance abuse ([place])    cocaine, last used 1999  . Presence of permanent cardiac pacemaker    since 2009  . PUD (peptic ulcer disease)    gastritis due to ETOH previously  . Pulmonary embolism ([place]) 04/2003   Hx of  . Rectal bleeding   . Sinus node dysfunction ([facility])    s/p MDT PPM - AAI - V port plugged  . Sleep apnea    uses CPAP [facility] 2 YRS  . Systolic and diastolic CHF, chronic ([place])    EF 35% by most recent echo 09/19/10  . 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 ANTERIOR CERVICAL DECOMP/DISCECTOMY  FUSION  12/15/2011   Procedure: ANTERIOR CERVICAL DECOMPRESSION/DISCECTOMY FUSION 1 LEVEL;  Surgeon: [name], MD;  Location: [facility];  Service: Neurosurgery;  Laterality: N/A;  Cervical Six-Seven Anterior Cervical Diskectomy fusion with Allograft and Plating  . ANTERIOR CERVICAL DECOMP/DISCECTOMY FUSION N/A 01/01/2020   Procedure: Anterior Cervical Discectomy Fusion - Cervical Seven- Thoracic One;  Surgeon: Earnie Larsson, MD;  Location: [facility];  Service: Neurosurgery;  Laterality: N/A;  Anterior Cervical Discectomy Fusion - Cervical Seven- Thoracic One  . ATRIAL ABLATION SURGERY  12/2010   afib ablation by [name]  . ATRIAL FIBRILLATION ABLATION N/A 05/06/2017   Procedure: Atrial Fibrillation Ablation;  Surgeon: Thompson Grayer, MD;  Location: [facility];  Service: Cardiovascular;  Laterality: N/A;  .  BACK SURGERY    . CARDIAC CATHETERIZATION     CADIAC ABLATION [name] 12/2010  . CARDIAC CATHETERIZATION N/A 05/08/2016   Procedure: Left Heart Cath and Coronary Angiography;  Surgeon: Peter M Martinique, MD;  Location: [facility];  Service: Cardiovascular;  Laterality: N/A;  . Fingers removed from right hand.  2/84   traumatic work injury  . INSERT / REPLACE / REMOVE PACEMAKER     05/2008    . LUMBAR SPINE SURGERY  02/2009   Dr. Trenton Gammon  . PACEMAKER INSERTION  10/09   by Dr [name]  . TEE WITHOUT CARDIOVERSION N/A 05/05/2017   Procedure: TRANSESOPHAGEAL ECHOCARDIOGRAM (TEE);  Surgeon: Larey Dresser, MD;  Location[facility];  Service: Cardiovascular;  Laterality: N/A;   Family History  Problem Relation Age of Onset  . Heart disease Mother   . Breast cancer Mother   . Lung cancer Father   . Diabetes Other   . Colon cancer Neg Hx   . Esophageal cancer Neg Hx   . Rectal cancer Neg Hx   . Stomach cancer Neg Hx    Social History   Tobacco Use  . Smoking status: Former Smoker    Packs/day: 1.00    Years: 15.00    Pack years: 15.00    Types: Cigarettes    Quit date:  04/04/2003    Years since quitting: 17.4  . Smokeless tobacco: Never Used  Vaping Use  . Vaping Use: Never used  Substance Use Topics  . Alcohol use: No    Comment: formerly heavy ETOH,  no alcohol in 20 years  . Drug use: No    Comment: previously used crack cocaine, quit 1999,  + marijuana   Current Outpatient Medications  Medication Sig Dispense Refill  . atorvastatin (LIPITOR) 40 MG tablet Take 40 mg by mouth daily.    . carvedilol (COREG) 25 MG tablet TAKE 1 TABLET (25 MG TOTAL) BY MOUTH 2 (TWO) TIMES DAILY WITH A MEAL. 180 tablet 2  . cholecalciferol (VITAMIN D3) 25 MCG (1000 UNIT) tablet Take 1,000 Units by mouth daily.    . cyanocobalamin (,VITAMIN B-12,) 1000 MCG/ML injection Inject 1,000 mcg into the muscle See admin instructions. Start with 62mL intramuscular each day for 7 consecutive days, then 23mL intramuscular each week for 4 weeks, then 21mL intramuscular each     . D3-50 1.25 MG (50000 UT) capsule Take 50,000 Units by mouth once a week.    . desonide (DESOWEN) 0.05 % cream Apply 1 application topically 2 (two) times daily as needed (for rash).     . dofetilide (TIKOSYN) 500 MCG capsule Take 1 capsule (500 mcg total) by mouth 2 (two) times daily. 180 capsule 1  . ELIQUIS 5 MG TABS tablet TAKE 1 TABLET BY MOUTH TWICE A DAY 60 tablet 5  . gabapentin (NEURONTIN) 100 MG capsule Take 1-2 capsules (100-200 mg total) by mouth See admin instructions. Patient takes 1 tablet in the morning and 2 tablets at night 60 capsule 3  . gabapentin (NEURONTIN) 100 MG capsule TAKE 1 CAPSULE BY MOUTH IN THE MORNING, AND TAKE 2 CAPSULES BY MOUTH AT BEDTIME.    [name] ibuprofen (ADVIL,MOTRIN) 200 MG tablet Take 800 mg by mouth every 6 (six) hours as needed for headache (back pain).    . methimazole (TAPAZOLE) 10 MG tablet Take 1 tablet (10 mg total) by mouth daily. 30 tablet 1  . oxyCODONE-acetaminophen (PERCOCET) 10-325 MG per tablet Take 1 tablet by mouth every  4 (four) hours as needed for pain. 29 tablet  0  . triamcinolone ointment (KENALOG) 0.1 % Apply 1 application topically 2 (two) times daily as needed (for rash).   3  . dexlansoprazole (DEXILANT) 60 MG capsule Take 1 capsule (60 mg total) by mouth daily. 90 capsule 3   No current facility-administered medications for this visit.   Allergies  Allergen Reactions  . Celecoxib Itching  . Hydrocodone Itching  . Prednisolone Itching  . Potassium Chloride Other (See Comments)    Dizziness and nausea      Review of Systems: All systems reviewed and negative except where noted in HPI.   Lab Results  Component Value Date   WBC 4.5 12/27/2019   HGB 14.5 12/27/2019   HCT 45.3 12/27/2019   MCV 83.3 12/27/2019   PLT 243 12/27/2019    Lab Results  Component Value Date   CREATININE 1.10 09/19/2020   BUN 16 09/19/2020   NA 140 09/19/2020   K 4.6 09/19/2020   CL 103 09/19/2020   CO2 22 09/19/2020    Lab Results  Component Value Date   ALT 23 12/26/2019   AST 22 12/26/2019   ALKPHOS 105 12/26/2019   BILITOT 0.5 12/26/2019     Physical Exam: BP 138/74   Pulse 68   Ht 5\' 11"  (1.803 m)   Wt 285 lb 12.8 oz (129.6 kg)   BMI 39.86 kg/m  Constitutional: Pleasant,well-developed, male in no acute distress. Neurological: Alert and oriented to person place and time. Psychiatric: Normal mood and affect. Behavior is normal.   ASSESSMENT AND PLAN: 60 year old male here for reassessment of the following:  GERD / chronic PPI use - he does have a 3 cm hiatal hernia which is predisposing him to reflux but no evidence of Barrett's on his last EGD.  He has failed higher dose omeprazole twice daily previously which led to trial of Dexilant.  Since that time he is not wanted to stop it since it works so well for him.  He denies any breakthrough if he takes this routinely.  However if he does miss a dose he will have a quick recurrence of his pyrosis.  We discussed chronic PPI use, long-term risks to include renal dysfunction, bone fracture,  vitamin deficiencies, etc.  He understands risks but feels benefits outweigh risks and wants to continue the regimen of Dexilant since it works so well for him.  I will see him in 1 year for reassessment.  I refilled his Dexilant otherwise for him today. No alarm symptoms otherwise.  History of duodenal adenomas - no recurrence on last EGD, will plan for surveillance exam in October 2023  History of colon polyps - history of advanced adenoma with last colonoscopy showing 3 small precancerous polyps.  Due for repeat colonoscopy in October 2023, he agreed  Food intolerance - he has a clear dairy intolerance, perhaps he has a lactose intolerance.  I discussed what this is with him.  He really enjoys dairy but is afraid to eat it.  Recommend he try some lactose-free milk or try supplementing with some lactase supplements and see if that helps.  He can try Lactaid initially and see how he tolerates that.  He can contact me for further advice moving forward if he continues to have problems despite trial of this or lactase supplement.  [place] Cellar, MD [facility] Gastroenter[facility]

## 2020-09-24 NOTE — Patient Instructions (Addendum)
If you are age 62 or older, your body mass index should be between 23-30. Your Body mass index is 39.86 kg/m. If this is out of the aforementioned range listed, please consider follow up with your Primary Care Provider.  If you are age 70 or younger, your body mass index should be between 19-25. Your Body mass index is 39.86 kg/m. If this is out of the aformentioned range listed, please consider follow up with your Primary Care Provider.    We have sent the following medications to your pharmacy for you to pick up at your convenience: Dexilant: Take once daily  You can take Lactaid over-the-counter fror dairy intolerance.  You will be due for a recall endoscopy/colonoscopy in 05-2022. We will send you a reminder in the mail when it gets closer to that time.  Thank you for entrusting me with your care and for choosing [facility], Dr. [place] Cellar

## 2020-09-30 NOTE — Pre-Procedure Instructions (Addendum)
Your procedure is scheduled on Monday, February 7th.  Report to [facility] Main Entrance "A" at 10:15 A.M., and check in at the Admitting office.  Call this number if you have problems the morning of surgery:  [phone]  Call [phone] if you have any questions prior to your surgery date Monday-Friday 8am-4pm    Remember:  Do not eat or drink after midnight the night before your surgery    Take these medicines the morning of surgery with A SIP OF WATER  atorvastatin (LIPITOR)  carvedilol (COREG)  dexlansoprazole (DEXILANT) dofetilide (TIKOSYN) gabapentin (NEURONTIN)  methimazole (TAPAZOLE)  oxyCODONE-acetaminophen (PERCOCET)-use as needed.  Per your cardiologist, HOLD ELIQUIS for 3 days prior to surgery.  Last dose should be 10/03/20.     As of today, STOP taking any Aspirin (unless otherwise instructed by your surgeon) Aleve, Naproxen, Ibuprofen, Motrin, Advil, Goody's, BC's, all herbal medications, fish oil, and all vitamins.             Do not wear jewelry.            Do not wear lotions, powders, colognes, or deodorant.            Men may shave face and neck.            Do not bring valuables to the hospital.            [facility] is not responsible for any belongings or valuables.  Do NOT Smoke (Tobacco/Vaping) or drink Alcohol 24 hours prior to your procedure If you use a CPAP at night, you may bring all equipment for your overnight stay.   Contacts, glasses, dentures or bridgework may not be worn into surgery.      For patients admitted to the hospital, discharge time will be determined by your treatment team.   Patients discharged the day of surgery will not be allowed to drive home, and someone needs to stay with them for 24 hours.    Special instructions:   [place]- Preparing For Surgery  Before surgery, you can play an important role. Because skin is not sterile, your skin needs to be as free of germs as possible. You can reduce the number of germs on  your skin by washing with CHG (chlorahexidine gluconate) Soap before surgery.  CHG is an antiseptic cleaner which kills germs and bonds with the skin to continue killing germs even after washing.    Oral Hygiene is also important to reduce your risk of infection.  Remember - BRUSH YOUR TEETH THE MORNING OF SURGERY WITH YOUR REGULAR TOOTHPASTE  Please do not use if you have an allergy to CHG or antibacterial soaps. If your skin becomes reddened/irritated stop using the CHG.  Do not shave (including legs and underarms) for at least 48 hours prior to first CHG shower. It is OK to shave your face.  Please follow these instructions carefully.   1. Shower the NIGHT BEFORE SURGERY and the MORNING OF SURGERY with CHG Soap.   2. If you chose to wash your hair, wash your hair first as usual with your normal shampoo.  3. After you shampoo, rinse your hair and body thoroughly to remove the shampoo.  4. Use CHG as you would any other liquid soap. You can apply CHG directly to the skin and wash gently with a scrungie or a clean washcloth.   5. Apply the CHG Soap to your body ONLY FROM THE NECK DOWN.  Do not use on open wounds  or open sores. Avoid contact with your eyes, ears, mouth and genitals (private parts). Wash Face and genitals (private parts)  with your normal soap.   6. Wash thoroughly, paying special attention to the area where your surgery will be performed.  7. Thoroughly rinse your body with warm water from the neck down.  8. DO NOT shower/wash with your normal soap after using and rinsing off the CHG Soap.  9. Pat yourself dry with a CLEAN TOWEL.  10. Wear CLEAN PAJAMAS to bed the night before surgery  11. Place CLEAN SHEETS on your bed the night of your first shower and DO NOT SLEEP WITH PETS.   Day of Surgery: Wear Clean/Comfortable clothing the morning of surgery Do not apply any deodorants/lotions.   Remember to brush your teeth WITH YOUR REGULAR TOOTHPASTE.   Please read over  the following fact sheets that you were given.

## 2020-10-01 ENCOUNTER — Other Ambulatory Visit: Payer: Self-pay

## 2020-10-01 ENCOUNTER — Encounter (HOSPITAL_COMMUNITY): Payer: Self-pay

## 2020-10-01 ENCOUNTER — Encounter (HOSPITAL_COMMUNITY)
Admission: RE | Admit: 2020-10-01 | Discharge: 2020-10-01 | Disposition: A | Discharge status: Home / Self Care | Payer: Medicaid Other | Source: Ambulatory Visit | Attending: Neurosurgery | Admitting: Neurosurgery

## 2020-10-01 DIAGNOSIS — Z01812 Encounter for preprocedural laboratory examination: Secondary | ICD-10-CM | POA: Insufficient documentation

## 2020-10-01 LAB — CBC WITH DIFFERENTIAL/PLATELET
Abs Immature Granulocytes: 0.02 10*3/uL (ref 0.00–0.07)
Basophils Absolute: 0 10*3/uL (ref 0.0–0.1)
Basophils Relative: 0 %
Eosinophils Absolute: 0.2 10*3/uL (ref 0.0–0.5)
Eosinophils Relative: 4 %
HCT: 44 % (ref 39.0–52.0)
Hemoglobin: 14 g/dL (ref 13.0–17.0)
Immature Granulocytes: 0 %
Lymphocytes Relative: 40 %
Lymphs Abs: 2.3 10*3/uL (ref 0.7–4.0)
MCH: 26.7 pg (ref 26.0–34.0)
MCHC: 31.8 g/dL (ref 30.0–36.0)
MCV: 83.8 fL (ref 80.0–100.0)
Monocytes Absolute: 0.6 10*3/uL (ref 0.1–1.0)
Monocytes Relative: 10 %
Neutro Abs: 2.7 10*3/uL (ref 1.7–7.7)
Neutrophils Relative %: 46 %
Platelets: 231 10*3/uL (ref 150–400)
RBC: 5.25 MIL/uL (ref 4.22–5.81)
RDW: 16 % — ABNORMAL HIGH (ref 11.5–15.5)
WBC: 5.8 10*3/uL (ref 4.0–10.5)
nRBC: 0 % (ref 0.0–0.2)

## 2020-10-01 LAB — BASIC METABOLIC PANEL
Anion gap: 10 (ref 5–15)
BUN: 20 mg/dL (ref 8–23)
CO2: 23 mmol/L (ref 22–32)
Calcium: 8.9 mg/dL (ref 8.9–10.3)
Chloride: 105 mmol/L (ref 98–111)
Creatinine, Ser: 1.02 mg/dL (ref 0.61–1.24)
GFR, Estimated: >60 mL/min (ref >60)
Glucose, Bld: 111 mg/dL — ABNORMAL HIGH (ref 70–99)
Potassium: 3.8 mmol/L (ref 3.5–5.1)
Sodium: 138 mmol/L (ref 135–145)

## 2020-10-01 LAB — TYPE AND SCREEN
ABO/RH(D): A POS
Antibody Screen: NEGATIVE

## 2020-10-01 LAB — SURGICAL PCR SCREEN
MRSA, PCR: NEGATIVE
Staphylococcus aureus: NEGATIVE

## 2020-10-01 NOTE — Progress Notes (Signed)
Anesthesia Chart Review:  Follows with cardiology for hx of CAD s/p NSTEMI possibly type II (cath showing nonobstructive disease 2017), afib s/p multiple ablations on Eliquis, sinus node dysfunction s/p Medtronic PPM, HFrEF, PE.  Cardiac clearance per progress note 09/19/2020, "Scheduled for back surgery  10/04/20 with Dr Annette Stable. EF 35-40% by echo 05/2017. LHC with non obstructive CAD 2017. He is cleared for surgery from cardiac perspective with at least moderate risk."  Per pharmacy note 09/13/2020 he was cleared to hold Eliquis 3 days prior to procedure.  Follows with pulmonologist Dr. Annamaria Boots for history of OSA on CPAP allergic rhinitis.  Last seen 05/20/2020, at that time patient reported waking up 2-3 nights a week for excess saliva.  Per note, "Two or three nights/ week he has to wipe out mouth for excess saliva. He asks if it is from the CPAP. No evident postnasal drip, reflux. Possibly related to orthostatic fluid shifts given his CHF. Following recommended CPAP cleaning.Marland KitchenMarland KitchenBenefits from CPAP. I suspect the excess drooling reflects his CHF, rather than a problem with his CPAP. However, machine is old. Plan- replace old CPAP machine auto 5-15."  History of GERD followed by GI, currently well controlled on Dexilant 60 mg daily.  Patient reports he has had 2 episodes of bronchitis following general anesthesia, wants to avoid this happening again.  I suggested he reach out to Dr. Annamaria Boots to discuss this to see if he has any perioperative recommendations for the patient.  Preop labs reviewed, unremarkable.   Perioperative device recommendations: Device Information:  Clinic EP Physician:  Thompson Grayer, MD (Ablation) Dr. [name] (device)  Device Type:  Pacemaker Manufacturer and Phone #:  Medtronic: [id number] Pacemaker Dependent?:  No. Date of Last Device Check:  07/02/20 ( seen in office 08/2020, but at time of this note no device check documented)    Normal Device Function?:  Yes.     Electrophysiologist's Recommendations:   Have magnet available.  Provide continuous ECG monitoring when magnet is used or reprogramming is to be performed.   Procedure will likely interfere with device function.  Device should be programmed:  Asynchronous pacing during procedure and returned to normal programming after procedure   EKG 09/19/2020:  Sinus rhythm.  Rate 74.  TEE 05/05/17: - Left ventricle: The cavity size was mildly dilated. Wall  thickness was normal. Systolic function was moderately reduced.  The estimated ejection fraction was in the range of 35% to 40%.  Diffuse hypokinesis.  - Aortic valve: There was no stenosis. There was trivial  regurgitation.  - Aorta: Normal caliber thoracic aorta with mild plaque.  - Mitral valve: There was trivial regurgitation.  - Left atrium: The atrium was moderately dilated. No evidence of  thrombus in the atrial cavity or appendage. No evidence of  thrombus in the atrial cavity or appendage.  - Right ventricle: The cavity size was mildly dilated. Systolic  function was mildly reduced.  - Right atrium: No evidence of thrombus in the atrial cavity or  appendage.  - Atrial septum: No defect or patent foramen ovale was identified.   Impressions:   - No LA thrombus, ok for ablation.   Cath 05/08/16: 1. Nonobstructive CAD 2. Mild to moderate LV dysfunction. EF 45% 3. Normal LV filling pressures.    [facility]y [facility] Short Stay Center/Anesthesiology Phone [phone] 10/02/2020 12:25 PM

## 2020-10-01 NOTE — Progress Notes (Signed)
PCP - Dr. Nancy Fetter at [facility] Cardiologist - Dr. Remo Lipps Klein/Dr. Allred  PPM/ICD - MEdtronic PPM Device Orders - Electronically sent to [facility] Rep Notified - Yes, via email  Chest x-ray - n/a EKG - 09/19/20 Stress Test - 05/18/13 ECHO - 05/05/17 Cardiac Cath -05/08/16   Sleep Study - OSA+ CPAP - uses nightly  Blood Thinner Instructions: Eliquis; LD 10/03/20 Aspirin Instructions: n/a  COVID TEST- Pt previously tested positive in 12/21 on a home test. Pt is within 90 days but does not have proof of those results. Scheduled pt to have covid test 10/03/20.   Anesthesia review: Yes, pt c/o respiratory issues after last two surgeries. Pt presented to ED a few days post-op with difficulty breathing. Pt states that he was dx with bronchitis and thought to be secondary to anesthesia. Karoline Caldwell, PA-C spoke with pt and explained that this could be from intubation and manipulation during his previous ACDF procedure. Encouraged pt to reach out to his pulmonologist to see if there was any input for further resolution. Anesthesia will be made aware prior to surgery.    Patient denies shortness of breath, fever, cough and chest pain at PAT appointment   All instructions explained to the patient, with a verbal understanding of the material. Patient agrees to go over the instructions while at home for a better understanding. Patient also instructed to self quarantine after being tested for COVID-19. The opportunity to ask questions was provided.

## 2020-10-02 ENCOUNTER — Encounter: Payer: Self-pay | Admitting: Student

## 2020-10-02 NOTE — Anesthesia Preprocedure Evaluation (Addendum)
Anesthesia Evaluation  Patient identified by MRN, date of birth, ID band Patient awake    Reviewed: Allergy & Precautions, NPO status , Patient's Chart, lab work & pertinent test results  History of Anesthesia Complications Negative for: history of anesthetic complications  Airway Mallampati: II  TM Distance: >3 FB Neck ROM: Full    Dental  (+) Poor Dentition, Missing,    Pulmonary sleep apnea and Continuous Positive Airway Pressure Ventilation , former smoker,    Pulmonary exam normal        Cardiovascular hypertension, + CAD, + Past MI and +CHF  Normal cardiovascular exam+ pacemaker      Neuro/Psych  Headaches, Depression    GI/Hepatic Neg liver ROS, GERD  Medicated,  Endo/Other  Hyperthyroidism   Renal/GU negative Renal ROS  negative genitourinary   Musculoskeletal  (+) Arthritis ,   Abdominal   Peds  Hematology negative hematology ROS (+)   Anesthesia Other Findings Day of surgery medications reviewed with patient.  Reproductive/Obstetrics negative OB ROS                          Anesthesia Physical Anesthesia Plan  ASA: III  Anesthesia Plan: General   Post-op Pain Management:    Induction: Intravenous  PONV Risk Score and Plan: 3 and Treatment may vary due to age or medical condition, Ondansetron, Dexamethasone and Midazolam  Airway Management Planned: Oral ETT  Additional Equipment: None  Intra-op Plan:   Post-operative Plan: Extubation in OR  Informed Consent: I have reviewed the patients History and Physical, chart, labs and discussed the procedure including the risks, benefits and alternatives for the proposed anesthesia with the patient or authorized representative who has indicated his/her understanding and acceptance.     Dental advisory given  Plan Discussed with: CRNA  Anesthesia Plan Comments: (PAT note by Karoline Caldwell, PA-C: Follows with cardiology for hx  of CAD s/p NSTEMI possibly type II (cath showing nonobstructive disease 2017), afib s/p multiple ablations on Eliquis, sinus node dysfunction s/p Medtronic PPM, HFrEF, PE.  Cardiac clearance per progress note 09/19/2020, "Scheduled for back surgery  10/04/20 with Dr Annette Stable. EF 35-40% by echo 05/2017. LHC with non obstructive CAD 2017. He is cleared for surgery from cardiac perspective with at least moderate risk."  Per pharmacy note 09/13/2020 he was cleared to hold Eliquis 3 days prior to procedure.  Follows with pulmonologist Dr. Annamaria Boots for history of OSA on CPAP allergic rhinitis.  Last seen 05/20/2020, at that time patient reported waking up 2-3 nights a week for excess saliva.  Per note, "Two or three nights/ week he has to wipe out mouth for excess saliva. He asks if it is from the CPAP. No evident postnasal drip, reflux. Possibly related to orthostatic fluid shifts given his CHF. Following recommended CPAP cleaning.Marland KitchenMarland KitchenBenefits from CPAP. I suspect the excess drooling reflects his CHF, rather than a problem with his CPAP. However, machine is old. Plan- replace old CPAP machine auto 5-15."  History of GERD followed by GI, currently well controlled on Dexilant 60 mg daily.  Patient reports he has had 2 episodes of bronchitis following general anesthesia, wants to avoid this happening again.  I suggested he reach out to Dr. Annamaria Boots to discuss this to see if he has any perioperative recommendations for the patient.  Preop labs reviewed, unremarkable.   Perioperative device recommendations: Device Information:  Clinic EP Physician:  Thompson Grayer, MD (Ablation) Dr. [name] (device)  Device Type:  Pacemaker  Manufacturer and Phone #:  Medtronic: [id number] Pacemaker Dependent?:  No. Date of Last Device Check:  07/02/20 ( seen in office 08/2020, but at time of this note no device check documented)    Normal Device Function?:  Yes.    Electrophysiologist's Recommendations:  Have magnet  available. Provide continuous ECG monitoring when magnet is used or reprogramming is to be performed.  Procedure will likely interfere with device function.  Device should be programmed:  Asynchronous pacing during procedure and returned to normal programming after procedure   EKG 09/19/2020:  Sinus rhythm.  Rate 74.  TEE 05/05/17: - Left ventricle: The cavity size was mildly dilated. Wall  thickness was normal. Systolic function was moderately reduced.  The estimated ejection fraction was in the range of 35% to 40%.  Diffuse hypokinesis.  - Aortic valve: There was no stenosis. There was trivial  regurgitation.  - Aorta: Normal caliber thoracic aorta with mild plaque.  - Mitral valve: There was trivial regurgitation.  - Left atrium: The atrium was moderately dilated. No evidence of  thrombus in the atrial cavity or appendage. No evidence of  thrombus in the atrial cavity or appendage.  - Right ventricle: The cavity size was mildly dilated. Systolic  function was mildly reduced.  - Right atrium: No evidence of thrombus in the atrial cavity or  appendage.  - Atrial septum: No defect or patent foramen ovale was identified.   Impressions:   - No LA thrombus, ok for ablation.   Cath 05/08/16: 1. Nonobstructive CAD 2. Mild to moderate LV dysfunction. EF 45% 3. Normal LV filling pressures.  )      Anesthesia Quick Evaluation

## 2020-10-02 NOTE — Progress Notes (Signed)
   [facility] DEVICE PROGRAMMING  Patient Information: Name:  [name]  DOB:  April 05, 1961  MRN:  [id number]    Planned Procedure: Posterior Lumbar Interbody Fusion-Lumbar three-lumbar four  Surgeon: Dr. Earnie Larsson  Date of Procedure: 10/07/20  Cautery will be used.  Position during surgery: Prone  Device Information:  Clinic EP Physician:  Thompson Grayer, MD (Ablation) Dr. [name] (device)  Device Type:  Pacemaker Manufacturer and Phone #:  Medtronic: [phone] Pacemaker Dependent?:  No. Date of Last Device Check:  07/02/20 ( seen in office 08/2020, but at time of this note no device check documented) Normal Device Function?:  Yes.    Electrophysiologist's Recommendations:   Have magnet available.  Provide continuous ECG monitoring when magnet is used or reprogramming is to be performed.   Procedure will likely interfere with device function.  Device should be programmed:  Asynchronous pacing during procedure and returned to normal programming after procedure  Per Device [facility] [address] [place], [place]  11:29 AM 10/02/2020

## 2020-10-03 ENCOUNTER — Other Ambulatory Visit (HOSPITAL_COMMUNITY)
Admission: RE | Admit: 2020-10-03 | Discharge: 2020-10-03 | Disposition: A | Discharge status: Home / Self Care | Payer: Medicaid Other | Source: Ambulatory Visit | Attending: Neurosurgery | Admitting: Neurosurgery

## 2020-10-03 DIAGNOSIS — Z20822 Contact with and (suspected) exposure to covid-19: Secondary | ICD-10-CM | POA: Insufficient documentation

## 2020-10-03 DIAGNOSIS — Z01812 Encounter for preprocedural laboratory examination: Secondary | ICD-10-CM | POA: Diagnosis present

## 2020-10-03 LAB — SARS CORONAVIRUS 2 (TAT 6-24 HRS): SARS Coronavirus 2: NEGATIVE

## 2020-10-07 ENCOUNTER — Encounter (HOSPITAL_COMMUNITY): Payer: Self-pay | Admitting: Neurosurgery

## 2020-10-07 ENCOUNTER — Ambulatory Visit (HOSPITAL_COMMUNITY): Payer: Medicaid Other | Admitting: Physician Assistant

## 2020-10-07 ENCOUNTER — Ambulatory Visit (HOSPITAL_COMMUNITY): Payer: Medicaid Other | Admitting: Certified Registered Nurse Anesthetist

## 2020-10-07 ENCOUNTER — Observation Stay (HOSPITAL_COMMUNITY)
Admission: RE | Admit: 2020-10-07 | Discharge: 2020-10-08 | Disposition: A | Discharge status: Home / Self Care | Payer: Medicaid Other | Attending: Neurosurgery | Admitting: Neurosurgery

## 2020-10-07 ENCOUNTER — Ambulatory Visit (HOSPITAL_COMMUNITY): Payer: Medicaid Other

## 2020-10-07 ENCOUNTER — Encounter (HOSPITAL_COMMUNITY)
Admission: RE | Disposition: A | Discharge status: Home / Self Care | Payer: Self-pay | Source: Home / Self Care | Attending: Neurosurgery

## 2020-10-07 DIAGNOSIS — Z7901 Long term (current) use of anticoagulants: Secondary | ICD-10-CM | POA: Insufficient documentation

## 2020-10-07 DIAGNOSIS — M4316 Spondylolisthesis, lumbar region: Principal | ICD-10-CM | POA: Insufficient documentation

## 2020-10-07 DIAGNOSIS — Z981 Arthrodesis status: Secondary | ICD-10-CM | POA: Diagnosis not present

## 2020-10-07 DIAGNOSIS — I252 Old myocardial infarction: Secondary | ICD-10-CM | POA: Diagnosis not present

## 2020-10-07 DIAGNOSIS — Z87891 Personal history of nicotine dependence: Secondary | ICD-10-CM | POA: Insufficient documentation

## 2020-10-07 DIAGNOSIS — I110 Hypertensive heart disease with heart failure: Secondary | ICD-10-CM | POA: Diagnosis not present

## 2020-10-07 DIAGNOSIS — Z79899 Other long term (current) drug therapy: Secondary | ICD-10-CM | POA: Insufficient documentation

## 2020-10-07 DIAGNOSIS — I5042 Chronic combined systolic (congestive) and diastolic (congestive) heart failure: Secondary | ICD-10-CM | POA: Insufficient documentation

## 2020-10-07 DIAGNOSIS — Z419 Encounter for procedure for purposes other than remedying health state, unspecified: Secondary | ICD-10-CM

## 2020-10-07 DIAGNOSIS — M4310 Spondylolisthesis, site unspecified: Secondary | ICD-10-CM | POA: Diagnosis present

## 2020-10-07 SURGERY — POSTERIOR LUMBAR FUSION 1 LEVEL
Anesthesia: General | Site: Back

## 2020-10-07 MED ORDER — SODIUM CHLORIDE 0.9% FLUSH: 3.0000 mL | Freq: Two times a day (BID) | INTRAVENOUS | Status: DC

## 2020-10-07 MED ORDER — VANCOMYCIN HCL 1000 MG IV SOLR
INTRAVENOUS | Status: DC | PRN
  Administered 2020-10-07: 1000 mg via TOPICAL

## 2020-10-07 MED ORDER — CHLORHEXIDINE GLUCONATE 0.12 % MT SOLN
OROMUCOSAL | Status: AC
  Administered 2020-10-07: 15 mL via OROMUCOSAL
  Filled 2020-10-07: qty 15

## 2020-10-07 MED ORDER — CEFAZOLIN SODIUM-DEXTROSE 2-4 GM/100ML-% IV SOLN
INTRAVENOUS | Status: AC
  Filled 2020-10-07: qty 100

## 2020-10-07 MED ORDER — MIDAZOLAM HCL 5 MG/5ML IJ SOLN
INTRAMUSCULAR | Status: DC | PRN
  Administered 2020-10-07: 2 mg via INTRAVENOUS

## 2020-10-07 MED ORDER — PANTOPRAZOLE SODIUM 40 MG PO TBEC
40.0000 mg | DELAYED_RELEASE_TABLET | Freq: Every day | ORAL | Status: DC
  Administered 2020-10-08: 40 mg via ORAL
  Filled 2020-10-07: qty 1

## 2020-10-07 MED ORDER — FLEET ENEMA 7-19 GM/118ML RE ENEM: 1.0000 | ENEMA | Freq: Once | RECTAL | Status: DC | PRN

## 2020-10-07 MED ORDER — ONDANSETRON HCL 4 MG/2ML IJ SOLN: 4.0000 mg | Freq: Four times a day (QID) | INTRAMUSCULAR | Status: DC | PRN

## 2020-10-07 MED ORDER — CHLORHEXIDINE GLUCONATE 0.12 % MT SOLN: 15.0000 mL | Freq: Once | OROMUCOSAL | Status: AC

## 2020-10-07 MED ORDER — PHENOL 1.4 % MT LIQD: 1.0000 | OROMUCOSAL | Status: DC | PRN

## 2020-10-07 MED ORDER — DEXAMETHASONE SODIUM PHOSPHATE 10 MG/ML IJ SOLN
INTRAMUSCULAR | Status: AC
  Filled 2020-10-07: qty 1

## 2020-10-07 MED ORDER — PROPOFOL 10 MG/ML IV BOLUS
INTRAVENOUS | Status: DC | PRN
  Administered 2020-10-07 (×2): 100 mg via INTRAVENOUS

## 2020-10-07 MED ORDER — FENTANYL CITRATE (PF) 250 MCG/5ML IJ SOLN
INTRAMUSCULAR | Status: AC
  Filled 2020-10-07: qty 5

## 2020-10-07 MED ORDER — ORAL CARE MOUTH RINSE: 15.0000 mL | Freq: Once | OROMUCOSAL | Status: AC

## 2020-10-07 MED ORDER — DEXAMETHASONE SODIUM PHOSPHATE 10 MG/ML IJ SOLN
INTRAMUSCULAR | Status: DC | PRN
  Administered 2020-10-07: 8 mg via INTRAVENOUS

## 2020-10-07 MED ORDER — FENTANYL CITRATE (PF) 100 MCG/2ML IJ SOLN
25.0000 ug | INTRAMUSCULAR | Status: DC | PRN
  Administered 2020-10-07 (×2): 50 ug via INTRAVENOUS

## 2020-10-07 MED ORDER — OXYCODONE HCL 5 MG PO TABS: 5.0000 mg | ORAL_TABLET | ORAL | Status: DC | PRN

## 2020-10-07 MED ORDER — PROPOFOL 10 MG/ML IV BOLUS
INTRAVENOUS | Status: AC
  Filled 2020-10-07: qty 20

## 2020-10-07 MED ORDER — FENTANYL CITRATE (PF) 100 MCG/2ML IJ SOLN
INTRAMUSCULAR | Status: AC
  Filled 2020-10-07: qty 2

## 2020-10-07 MED ORDER — LIDOCAINE 2% (20 MG/ML) 5 ML SYRINGE
INTRAMUSCULAR | Status: AC
  Filled 2020-10-07: qty 5

## 2020-10-07 MED ORDER — POLYETHYLENE GLYCOL 3350 17 G PO PACK: 17.0000 g | PACK | Freq: Every day | ORAL | Status: DC | PRN

## 2020-10-07 MED ORDER — VANCOMYCIN HCL 1000 MG IV SOLR
INTRAVENOUS | Status: AC
  Filled 2020-10-07: qty 1000

## 2020-10-07 MED ORDER — MIDAZOLAM HCL 2 MG/2ML IJ SOLN
INTRAMUSCULAR | Status: AC
  Filled 2020-10-07: qty 2

## 2020-10-07 MED ORDER — METHIMAZOLE 10 MG PO TABS
10.0000 mg | ORAL_TABLET | Freq: Every day | ORAL | Status: DC
  Administered 2020-10-08: 10 mg via ORAL
  Filled 2020-10-07: qty 1

## 2020-10-07 MED ORDER — IBUPROFEN 800 MG PO TABS
800.0000 mg | ORAL_TABLET | Freq: Four times a day (QID) | ORAL | Status: DC | PRN
  Administered 2020-10-08: 800 mg via ORAL
  Filled 2020-10-07: qty 1

## 2020-10-07 MED ORDER — DOFETILIDE 500 MCG PO CAPS
500.0000 ug | ORAL_CAPSULE | Freq: Two times a day (BID) | ORAL | Status: DC
  Administered 2020-10-07 – 2020-10-08 (×2): 500 ug via ORAL
  Filled 2020-10-07 (×3): qty 1

## 2020-10-07 MED ORDER — OXYCODONE HCL 5 MG/5ML PO SOLN: 5.0000 mg | Freq: Once | ORAL | Status: DC | PRN

## 2020-10-07 MED ORDER — SODIUM CHLORIDE 0.9 % IV SOLN
250.0000 mL | INTRAVENOUS | Status: DC
  Administered 2020-10-07: 250 mL via INTRAVENOUS

## 2020-10-07 MED ORDER — ROCURONIUM BROMIDE 10 MG/ML (PF) SYRINGE
PREFILLED_SYRINGE | INTRAVENOUS | Status: AC
  Filled 2020-10-07: qty 10

## 2020-10-07 MED ORDER — ONDANSETRON HCL 4 MG/2ML IJ SOLN
INTRAMUSCULAR | Status: AC
  Filled 2020-10-07: qty 2

## 2020-10-07 MED ORDER — 0.9 % SODIUM CHLORIDE (POUR BTL) OPTIME
TOPICAL | Status: DC | PRN
  Administered 2020-10-07: 1000 mL

## 2020-10-07 MED ORDER — GABAPENTIN 100 MG PO CAPS
100.0000 mg | ORAL_CAPSULE | Freq: Every day | ORAL | Status: DC
  Administered 2020-10-08: 100 mg via ORAL
  Filled 2020-10-07: qty 1

## 2020-10-07 MED ORDER — BISACODYL 10 MG RE SUPP: 10.0000 mg | Freq: Every day | RECTAL | Status: DC | PRN

## 2020-10-07 MED ORDER — ROCURONIUM BROMIDE 10 MG/ML (PF) SYRINGE
PREFILLED_SYRINGE | INTRAVENOUS | Status: DC | PRN
  Administered 2020-10-07: 60 mg via INTRAVENOUS
  Administered 2020-10-07: 40 mg via INTRAVENOUS

## 2020-10-07 MED ORDER — ACETAMINOPHEN 325 MG PO TABS
650.0000 mg | ORAL_TABLET | ORAL | Status: DC | PRN
  Administered 2020-10-07 – 2020-10-08 (×2): 650 mg via ORAL
  Filled 2020-10-07 (×2): qty 2

## 2020-10-07 MED ORDER — DIAZEPAM 5 MG PO TABS
5.0000 mg | ORAL_TABLET | Freq: Four times a day (QID) | ORAL | Status: DC | PRN
  Administered 2020-10-07 – 2020-10-08 (×2): 5 mg via ORAL
  Filled 2020-10-07 (×2): qty 1

## 2020-10-07 MED ORDER — PROMETHAZINE HCL 25 MG/ML IJ SOLN: 6.2500 mg | INTRAMUSCULAR | Status: DC | PRN

## 2020-10-07 MED ORDER — LACTATED RINGERS IV SOLN: INTRAVENOUS | Status: DC

## 2020-10-07 MED ORDER — OXYCODONE HCL 5 MG PO TABS
10.0000 mg | ORAL_TABLET | ORAL | Status: DC | PRN
  Administered 2020-10-07 – 2020-10-08 (×5): 10 mg via ORAL
  Filled 2020-10-07 (×4): qty 2

## 2020-10-07 MED ORDER — ACETAMINOPHEN 500 MG PO TABS: 1000.0000 mg | ORAL_TABLET | Freq: Once | ORAL | Status: DC

## 2020-10-07 MED ORDER — OXYCODONE HCL 5 MG PO TABS: 5.0000 mg | ORAL_TABLET | Freq: Once | ORAL | Status: DC | PRN

## 2020-10-07 MED ORDER — CARVEDILOL 25 MG PO TABS
25.0000 mg | ORAL_TABLET | Freq: Two times a day (BID) | ORAL | Status: DC
  Administered 2020-10-07 – 2020-10-08 (×2): 25 mg via ORAL
  Filled 2020-10-07 (×2): qty 1

## 2020-10-07 MED ORDER — OXYCODONE-ACETAMINOPHEN 10-325 MG PO TABS: 1.0000 | ORAL_TABLET | ORAL | Status: DC | PRN

## 2020-10-07 MED ORDER — LIDOCAINE 2% (20 MG/ML) 5 ML SYRINGE
INTRAMUSCULAR | Status: DC | PRN
  Administered 2020-10-07: 60 mg via INTRAVENOUS

## 2020-10-07 MED ORDER — GABAPENTIN 100 MG PO CAPS
200.0000 mg | ORAL_CAPSULE | Freq: Every day | ORAL | Status: DC
  Administered 2020-10-07: 200 mg via ORAL
  Filled 2020-10-07: qty 2

## 2020-10-07 MED ORDER — CHLORHEXIDINE GLUCONATE CLOTH 2 % EX PADS: 6.0000 | MEDICATED_PAD | Freq: Once | CUTANEOUS | Status: DC

## 2020-10-07 MED ORDER — CEFAZOLIN SODIUM-DEXTROSE 1-4 GM/50ML-% IV SOLN
1.0000 g | Freq: Three times a day (TID) | INTRAVENOUS | Status: AC
  Administered 2020-10-07 – 2020-10-08 (×2): 1 g via INTRAVENOUS
  Filled 2020-10-07: qty 50

## 2020-10-07 MED ORDER — CEFAZOLIN SODIUM-DEXTROSE 2-4 GM/100ML-% IV SOLN
2.0000 g | INTRAVENOUS | Status: AC
  Administered 2020-10-07: 2 g via INTRAVENOUS

## 2020-10-07 MED ORDER — HYDROMORPHONE HCL 1 MG/ML IJ SOLN
1.0000 mg | INTRAMUSCULAR | Status: DC | PRN
  Filled 2020-10-07: qty 1

## 2020-10-07 MED ORDER — PHENYLEPHRINE HCL-NACL 10-0.9 MG/250ML-% IV SOLN
INTRAVENOUS | Status: DC | PRN
  Administered 2020-10-07: 10 ug/min via INTRAVENOUS

## 2020-10-07 MED ORDER — FENTANYL CITRATE (PF) 100 MCG/2ML IJ SOLN
INTRAMUSCULAR | Status: DC | PRN
  Administered 2020-10-07: 50 ug via INTRAVENOUS
  Administered 2020-10-07: 100 ug via INTRAVENOUS
  Administered 2020-10-07: 50 ug via INTRAVENOUS
  Administered 2020-10-07: 100 ug via INTRAVENOUS
  Administered 2020-10-07: 50 ug via INTRAVENOUS

## 2020-10-07 MED ORDER — SUGAMMADEX SODIUM 200 MG/2ML IV SOLN
INTRAVENOUS | Status: DC | PRN
  Administered 2020-10-07: 200 mg via INTRAVENOUS

## 2020-10-07 MED ORDER — SODIUM CHLORIDE 0.9% FLUSH: 3.0000 mL | INTRAVENOUS | Status: DC | PRN

## 2020-10-07 MED ORDER — MENTHOL 3 MG MT LOZG: 1.0000 | LOZENGE | OROMUCOSAL | Status: DC | PRN

## 2020-10-07 MED ORDER — ATORVASTATIN CALCIUM 40 MG PO TABS
40.0000 mg | ORAL_TABLET | Freq: Every day | ORAL | Status: DC
Start: 2020-10-08 — End: 2020-10-08
  Administered 2020-10-08: 40 mg via ORAL
  Filled 2020-10-07: qty 1

## 2020-10-07 MED ORDER — BUPIVACAINE HCL (PF) 0.25 % IJ SOLN
INTRAMUSCULAR | Status: AC
  Filled 2020-10-07: qty 30

## 2020-10-07 MED ORDER — ONDANSETRON HCL 4 MG PO TABS: 4.0000 mg | ORAL_TABLET | Freq: Four times a day (QID) | ORAL | Status: DC | PRN

## 2020-10-07 MED ORDER — ACETAMINOPHEN 650 MG RE SUPP: 650.0000 mg | RECTAL | Status: DC | PRN

## 2020-10-07 MED ORDER — THROMBIN 20000 UNITS EX SOLR
CUTANEOUS | Status: AC
  Filled 2020-10-07: qty 20000

## 2020-10-07 MED ORDER — THROMBIN 20000 UNITS EX SOLR
CUTANEOUS | Status: DC | PRN
  Administered 2020-10-07: 20 mL via TOPICAL

## 2020-10-07 MED ORDER — PHENYLEPHRINE HCL (PRESSORS) 10 MG/ML IV SOLN
INTRAVENOUS | Status: AC
  Filled 2020-10-07: qty 1

## 2020-10-07 MED ORDER — PROMETHAZINE HCL 25 MG/ML IJ SOLN
INTRAMUSCULAR | Status: AC
  Administered 2020-10-07: 12.5 mg via INTRAVENOUS
  Filled 2020-10-07: qty 1

## 2020-10-07 MED ORDER — ONDANSETRON HCL 4 MG/2ML IJ SOLN
INTRAMUSCULAR | Status: DC | PRN
  Administered 2020-10-07: 4 mg via INTRAVENOUS

## 2020-10-07 MED ORDER — FENTANYL CITRATE (PF) 100 MCG/2ML IJ SOLN
INTRAMUSCULAR | Status: AC
  Administered 2020-10-07: 50 ug via INTRAVENOUS
  Filled 2020-10-07: qty 2

## 2020-10-07 MED ORDER — HYDROCODONE-ACETAMINOPHEN 10-325 MG PO TABS: 1.0000 | ORAL_TABLET | ORAL | Status: DC | PRN

## 2020-10-07 MED ORDER — BUPIVACAINE HCL (PF) 0.25 % IJ SOLN
INTRAMUSCULAR | Status: DC | PRN
  Administered 2020-10-07: 20 mL

## 2020-10-07 MED ORDER — ALBUMIN HUMAN 5 % IV SOLN: INTRAVENOUS | Status: DC | PRN

## 2020-10-07 SURGICAL SUPPLY — 64 items
ADH SKN CLS APL DERMABOND .7 (GAUZE/BANDAGES/DRESSINGS) ×1
APL SKNCLS STERI-STRIP NONHPOA (GAUZE/BANDAGES/DRESSINGS) ×1
BAG DECANTER FOR FLEXI CONT (MISCELLANEOUS) ×2 IMPLANT
BENZOIN TINCTURE PRP APPL 2/3 (GAUZE/BANDAGES/DRESSINGS) ×2 IMPLANT
BLADE CLIPPER SURG (BLADE) IMPLANT
BONE GRAFTON DBF INJECT 6CC (Bone Implant) ×1 IMPLANT
BUR CUTTER 7.0 ROUND (BURR) IMPLANT
BUR MATCHSTICK NEURO 3.0 LAGG (BURR) ×2 IMPLANT
CAGE EXP CATALYFT 9 (Plate) ×2 IMPLANT
CANISTER SUCT 3000ML PPV (MISCELLANEOUS) ×2 IMPLANT
CAP LCK SPNE (Orthopedic Implant) ×4 IMPLANT
CAP LOCK SPINE RADIUS (Orthopedic Implant) IMPLANT
CAP LOCKING (Orthopedic Implant) ×8 IMPLANT
CARTRIDGE OIL MAESTRO DRILL (MISCELLANEOUS) ×1 IMPLANT
CNTNR URN SCR LID CUP LEK RST (MISCELLANEOUS) ×1 IMPLANT
CONT SPEC 4OZ STRL OR WHT (MISCELLANEOUS) ×2
COVER BACK TABLE 60X90IN (DRAPES) ×2 IMPLANT
COVER WAND RF STERILE (DRAPES) ×2 IMPLANT
DECANTER SPIKE VIAL GLASS SM (MISCELLANEOUS) ×2 IMPLANT
DERMABOND ADVANCED (GAUZE/BANDAGES/DRESSINGS) ×1
DERMABOND ADVANCED .7 DNX12 (GAUZE/BANDAGES/DRESSINGS) ×1 IMPLANT
DIFFUSER DRILL AIR PNEUMATIC (MISCELLANEOUS) ×2 IMPLANT
DRAPE C-ARM 42X72 X-RAY (DRAPES) ×4 IMPLANT
DRAPE HALF SHEET 40X57 (DRAPES) IMPLANT
DRAPE LAPAROTOMY 100X72X124 (DRAPES) ×2 IMPLANT
DRAPE SURG 17X23 STRL (DRAPES) ×8 IMPLANT
DRSG OPSITE POSTOP 4X6 (GAUZE/BANDAGES/DRESSINGS) ×2 IMPLANT
DRSG OPSITE POSTOP 4X8 (GAUZE/BANDAGES/DRESSINGS) ×1 IMPLANT
DURAPREP 26ML APPLICATOR (WOUND CARE) ×2 IMPLANT
ELECT REM PT RETURN 9FT ADLT (ELECTROSURGICAL) ×2
ELECTRODE REM PT RTRN 9FT ADLT (ELECTROSURGICAL) ×1 IMPLANT
EVACUATOR 1/8 PVC DRAIN (DRAIN) IMPLANT
GAUZE 4X4 16PLY RFD (DISPOSABLE) IMPLANT
GAUZE SPONGE 4X4 12PLY STRL (GAUZE/BANDAGES/DRESSINGS) IMPLANT
GLOVE BIO SURGEON STRL SZ 6.5 (GLOVE) ×2 IMPLANT
GLOVE ECLIPSE 9.0 STRL (GLOVE) ×4 IMPLANT
GLOVE EXAM NITRILE XL STR (GLOVE) IMPLANT
GLOVE SURG UNDER POLY LF SZ6.5 (GLOVE) ×2 IMPLANT
GOWN STRL REUS W/ TWL LRG LVL3 (GOWN DISPOSABLE) IMPLANT
GOWN STRL REUS W/ TWL XL LVL3 (GOWN DISPOSABLE) ×2 IMPLANT
GOWN STRL REUS W/TWL 2XL LVL3 (GOWN DISPOSABLE) IMPLANT
GOWN STRL REUS W/TWL LRG LVL3 (GOWN DISPOSABLE)
GOWN STRL REUS W/TWL XL LVL3 (GOWN DISPOSABLE) ×4
KIT BASIN OR (CUSTOM PROCEDURE TRAY) ×2 IMPLANT
KIT TURNOVER KIT B (KITS) ×2 IMPLANT
MILL MEDIUM DISP (BLADE) ×2 IMPLANT
NEEDLE HYPO 22GX1.5 SAFETY (NEEDLE) ×2 IMPLANT
NS IRRIG 1000ML POUR BTL (IV SOLUTION) ×2 IMPLANT
OIL CARTRIDGE MAESTRO DRILL (MISCELLANEOUS) ×2
PACK LAMINECTOMY NEURO (CUSTOM PROCEDURE TRAY) ×2 IMPLANT
ROD RADIUS 45MM (Rod) ×4 IMPLANT
ROD SPNL 45X5.5XNS TI RDS (Rod) IMPLANT
SCREW 5.75X45MM (Screw) ×1 IMPLANT
SCREW 5.75X50MM (Screw) ×1 IMPLANT
SPONGE SURGIFOAM ABS GEL 100 (HEMOSTASIS) ×2 IMPLANT
STRIP CLOSURE SKIN 1/2X4 (GAUZE/BANDAGES/DRESSINGS) ×3 IMPLANT
SUT VIC AB 0 CT1 18XCR BRD8 (SUTURE) ×2 IMPLANT
SUT VIC AB 0 CT1 8-18 (SUTURE) ×2
SUT VIC AB 2-0 CT1 18 (SUTURE) ×2 IMPLANT
SUT VIC AB 3-0 SH 8-18 (SUTURE) ×5 IMPLANT
TOWEL GREEN STERILE (TOWEL DISPOSABLE) ×2 IMPLANT
TOWEL GREEN STERILE FF (TOWEL DISPOSABLE) ×2 IMPLANT
TRAY FOLEY MTR SLVR 16FR STAT (SET/KITS/TRAYS/PACK) ×2 IMPLANT
WATER STERILE IRR 1000ML POUR (IV SOLUTION) ×2 IMPLANT

## 2020-10-07 NOTE — Progress Notes (Addendum)
Called medtronic rep Gae Dry [phone] regarding pateint's pacer recommendations.  He stated that with the pacer being more than 6 inches from surgical area that it should be fine with just the magnet.

## 2020-10-07 NOTE — Anesthesia Procedure Notes (Signed)
Procedure Name: Intubation Date/Time: 10/07/2020 12:11 PM Performed by: Oletta Lamas, CRNA Pre-anesthesia Checklist: Patient identified, Emergency Drugs available, Suction available and Patient being monitored Patient Re-evaluated:Patient Re-evaluated prior to induction Oxygen Delivery Method: Circle System Utilized Preoxygenation: Pre-oxygenation with 100% oxygen Induction Type: IV induction Ventilation: Mask ventilation without difficulty Laryngoscope Size: Mac and 4 Grade View: Grade I Tube type: Oral Tube size: 7.5 mm Number of attempts: 1 Airway Equipment and Method: Stylet and Oral airway Placement Confirmation: ETT inserted through vocal cords under direct vision,  positive ETCO2 and breath sounds checked- equal and bilateral Secured at: 22 cm Tube secured with: Tape Dental Injury: Teeth and Oropharynx as per pre-operative assessment

## 2020-10-07 NOTE — Progress Notes (Signed)
Orthopedic Tech Progress Note Patient Details:  [name] 1961-02-03 [id number] Order lumbar brace Patient ID: [name], male   DOB: June 22, 1961, 60 y.o.   MRN: [id number]   Tammy Sours 10/07/2020, 3:32 PM

## 2020-10-07 NOTE — H&P (Signed)
[name] is an 60 y.o. male.   Chief Complaint: Back pain HPI: 60 year old male remotely status post L4-5 decompression and fusion presents with worsening back and bilateral lower extremity symptoms failing conservative management.  Work-up demonstrates evidence of degenerative spondylolisthesis with marked facet arthropathy and facet joint diastases at L3-4 above the level of his prior fusion.  There is significant stenosis and ongoing radicular pain and numbness.  Patient has failed conservative management presents now for lumbar decompression and fusion in hopes of improving his symptoms.  Past Medical History:  Diagnosis Date  . Atrial fibrillation ([place])    s/p afib ablation  PVI 5/12 JA  . CAD -nonobstructive    S/P nstemi-type II cath 05/2008 - nonobs. dzs.  . Cataract    left eye 2019  . Colon polyps   . Complication of anesthesia    last 2 surgeries pt had some respiratory issues following surgery required ED visit, dx with bronchitis.   . DDD (degenerative disc disease)    evaluate by neurosurgery is ongoing  . Depression    ptsd  . Duodenal adenoma   . Erectile dysfunction   . GERD (gastroesophageal reflux disease)   . Headache(784.0)   . Hemorrhoids   . HTN (hypertension)   . Hyperlipemia   . Hyperthyroidism    [place] dz on methimazole and followed by [name]  . Myocardial infarction ([place])    2009  . Obesity   . Pancreatitis   . Polysubstance abuse ([place])    cocaine, last used 1999  . Presence of permanent cardiac pacemaker    since 2009  . PUD (peptic ulcer disease)    gastritis due to ETOH previously  . Pulmonary embolism ([place]) 04/2003   Hx of  . Rectal bleeding   . Sinus node dysfunction ([facility])    s/p MDT PPM - AAI - V port plugged  . Sleep apnea    uses CPAP [facility] 2 YRS  . Systolic and diastolic CHF, chronic ([facility])    EF 35% by most recent echo 09/19/10  . 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 ANTERIOR CERVICAL DECOMP/DISCECTOMY FUSION  12/15/2011   Procedure: ANTERIOR CERVICAL DECOMPRESSION/DISCECTOMY FUSION 1 LEVEL;  Surgeon: [name], MD;  Location: [facility];  Service: Neurosurgery;  Laterality: N/A;  Cervical Six-Seven Anterior Cervical Diskectomy fusion with Allograft and Plating  . ANTERIOR CERVICAL DECOMP/DISCECTOMY FUSION N/A 01/01/2020   Procedure: Anterior Cervical Discectomy Fusion - Cervical Seven- Thoracic One;  Surgeon: Earnie Larsson, MD;  Location: [facility];  Service: Neurosurgery;  Laterality: N/A;  Anterior Cervical Discectomy Fusion - Cervical Seven- Thoracic One  . ATRIAL ABLATION SURGERY  12/2010   afib ablation by [name]  . ATRIAL FIBRILLATION ABLATION N/A 05/06/2017   Procedure: Atrial Fibrillation Ablation;  Surgeon: Thompson Grayer, MD;  Location: [facility];  Service: Cardiovascular;  Laterality: N/A;  . BACK SURGERY    . CARDIAC CATHETERIZATION     CADIAC ABLATION [name] 12/2010  . CARDIAC CATHETERIZATION N/A 05/08/2016   Procedure: Left Heart Cath and Coronary Angiography;  Surgeon: Peter M Martinique, MD;  Location: [facility];  Service: Cardiovascular;  Laterality: N/A;  . Fingers removed from right hand.  2/84   traumatic work injury  . INSERT / REPLACE / REMOVE PACEMAKER     05/2008    . LUMBAR SPINE SURGERY  02/2009   Dr. Trenton Gammon  . PACEMAKER INSERTION  10/09   by Dr [name]  . TEE WITHOUT CARDIOVERSION N/A 05/05/2017   Procedure: TRANSESOPHAGEAL ECHOCARDIOGRAM (TEE);  Surgeon: Larey Dresser, MD;  [facility];  Service: Cardiovascular;  Laterality: N/A;    Family History  Problem Relation Age of Onset  . Heart disease Mother   . Breast cancer Mother   . Lung cancer Father   . Diabetes Other   . Colon cancer Neg Hx   . Esophageal cancer Neg Hx   . Rectal cancer Neg Hx   . Stomach cancer Neg Hx    Social History:  reports that he quit smoking about 17 years ago. His smoking use included  cigarettes. He has a 15.00 pack-year smoking history. He has never used smokeless tobacco. He reports that he does not drink alcohol and does not use drugs.  Allergies:  Allergies  Allergen Reactions  . Celecoxib Itching  . Hydrocodone Itching  . Prednisolone Itching  . Potassium Chloride Other (See Comments)    Dizziness and nausea     Medications Prior to Admission  Medication Sig Dispense Refill  . atorvastatin (LIPITOR) 40 MG tablet Take 40 mg by mouth daily.    . carvedilol (COREG) 25 MG tablet TAKE 1 TABLET (25 MG TOTAL) BY MOUTH 2 (TWO) TIMES DAILY WITH A MEAL. 180 tablet 2  . desonide (DESOWEN) 0.05 % cream Apply 1 application topically 2 (two) times daily as needed (for rash).     [name] dexlansoprazole (DEXILANT) 60 MG capsule Take 1 capsule (60 mg total) by mouth daily. 90 capsule 3  . dofetilide (TIKOSYN) 500 MCG capsule Take 1 capsule (500 mcg total) by mouth 2 (two) times daily. 180 capsule 1  . ELIQUIS 5 MG TABS tablet TAKE 1 TABLET BY MOUTH TWICE A DAY (Patient taking differently: Take 5 mg by mouth 2 (two) times daily.) 60 tablet 5  . gabapentin (NEURONTIN) 100 MG capsule Take 1-2 capsules (100-200 mg total) by mouth See admin instructions. Patient takes 1 tablet in the morning and 2 tablets at night 60 capsule 3  . ibuprofen (ADVIL,MOTRIN) 200 MG tablet Take 800 mg by mouth every 6 (six) hours as needed for headache (back pain).    . methimazole (TAPAZOLE) 10 MG tablet Take 1 tablet (10 mg total) by mouth daily. 30 tablet 1  . oxyCODONE-acetaminophen (PERCOCET) 10-325 MG per tablet Take 1 tablet by mouth every 4 (four) hours as needed for pain. 29 tablet 0  . triamcinolone ointment (KENALOG) 0.1 % Apply 1 application topically 2 (two) times daily as needed (for rash).   3    No results found for this or any previous visit (from the past 48 hour(s)). No results found.  Pertinent items noted in HPI and remainder of comprehensive ROS otherwise negative.  Blood pressure (!)  146/89, pulse 63, temperature (!) 97.5 F (36.4 C), temperature source Temporal, resp. rate 18, height 5\' 11"  (1.803 m), weight 131.1 kg, SpO2 97 %.  Patient is awake and alert.  Oriented and appropriate.  Speech is fluent.  Judgment insight are intact.  Cranial nerve function normal bilateral.  Motor examination extremities reveals some mild weakness of dorsiflexion and knee extension on the left.  Otherwise motor strength intact.  Sensor examination with decrease sensation pinprick and light touch in his left L4 dermatome.  Deep 1 tendon reflexes active except his Achilles reflexes are absent bilaterally.  No evidence of long track signs.  Gait  is antalgic.  Posture is flexed.  Examination of his extremities reveal multiple digital amputations from a prior septic crisis.  Chest and abdomen are benign. Assessment/Plan L3-4 degenerative unstable spondylolisthesis with back pain and radiculopathy.  Plan bilateral L3-4 decompressive laminotomies and foraminotomies followed by posterior lumbar interbody fusion utilizing interbody cages, local autograft, and augmented with posterior arthrodesis utilizing nonsegmental pedicle screw fixation local autografting.  Risks and benefits of been explained.  Patient wishes to proceed.  Mallie Mussel A Kacy Conely 10/07/2020, 11:29 AM

## 2020-10-07 NOTE — Op Note (Signed)
Date of procedure: 10/07/2020  Date of dictation: Same  Service: Neurosurgery  Preoperative diagnosis: L3-L4 degenerative spondylolisthesis with stenosis  Postoperative diagnosis: Same  Procedure Name: Redo L3-L4 decompressive laminotomies with bilateral foraminotomies, more than would be required for simple interbody fusion alone.  L3-4 posterior lumbar interbody fusion utilizing interbody cages, morselized autograft, and morselized allograft, L3-4 posterior lateral arthrodesis utilizing nonsegmental pedicle screw fixation and local autografting.  Reexploration of L4-5 posterior lateral fusion with removal of hardware  Surgeon:Sevan Mcbroom A.Kati Riggenbach, M.D.  Asst. Surgeon: Reinaldo Meeker, NP  Anesthesia: General  Indication: 60 year old male status post prior L4-5 decompression and fusion presents with worsening back and bilateral lower extremity symptoms which have failed conservative management.  Patient has evidence of an unstable grade 1 L3-4 degenerative spondylolisthesis with marked facet arthropathy and associated stenosis.  Patient presents now for lumbar decompression and fusion in hopes improving her symptoms.  Operative note: After induction of anesthesia, patient position prone on the Wilson frame and properly padded.  Patient's lumbar region prepped and draped sterilely.  Incision made overlying L3-4 and 5.  Dissection performed bilaterally.  Retractor placed.  Fluoroscopy used.  Levels confirmed.  Previously placed pedicle screw instrumentation L4-5 was dissected free, disassembled and the inferior screws at L5 were removed.  Fusion at L4-5 had been inspected and found to be quite solid.  Decompressive laminotomies and facetectomies were then performed using [facility] a high-speed drill to remove the inferior two thirds of the lamina of L3 the entire inferior facet of L3 the majority of the superior facet of L4.  Ligamentum flavum and epidural scar were elevated and resected.   Wide foraminotomies were completed on the course exiting L3 and L4 nerve roots bilaterally.  Epidural venous plexus was coagulated and cut.  Discectomies then performed bilaterally.  Dissipates then prepared for interbody fusion with a distractor placed the patient's right side to space cleaned of all soft tissue on the left side.  A 9 mm Medtronic interbody cage was then impacted into place and expanded.  Distractor removed patient's right side.  To space prepared on the right side.  Morselized autograft packed in the interspace.  Second cage packed with autograft was then impacted into place and expanded.  Pedicles at L3 were then identified using surface landmarks and intraoperative fluoroscopy and superficial bone around the pedicle was then removed using high-speed drill patient pedicles then probed using pedicle all each pedicle all track was then probed and found to be solidly within the bone.  Each pedicle all track was then tapped with a screw tap.  Each screw temple was probed and found to be solidly within the bone.  5.75 mm radius brand screws from [employer] were placed bilaterally at L3.  Final images reveal good position of the cages and the hardware at the proper upper level with normal alignment of spine.  Short segment titanium rods placed to the screws at L3 and L4.  Locking caps placed over the screws and locking caps and engaged with a construct under compression.  Transverse processes of L3 and L4 were decorticated.  Morselized autograft was packed posterior laterally.  Allograft demineralized bone fibers were then packed into the cages bilaterally.  Gelfoam was placed over the laminotomy defect.  Wound is then irrigated 1 final time.  Vancomycin powder was placed in the deep wound space.  Wounds then closed in layers with Vicryl sutures.  Steri-Strips and sterile dressing were applied.  No apparent complications.  Patient tolerated the procedure  well and he returned to the recovery room  postop.

## 2020-10-07 NOTE — Transfer of Care (Signed)
Immediate Anesthesia Transfer of Care Note  Patient: [name]  Procedure(s) Performed: POSTERIOR LUMBAR INTERBODY FUSION - LUMBAR THREE-LUMBAR FOUR (N/A Back)  Patient Location: PACU  Anesthesia Type:General  Level of Consciousness: awake and alert   Airway & Oxygen Therapy: Patient Spontanous Breathing and Patient connected to face mask oxygen  Post-op Assessment: Report given to RN, Post -op Vital signs reviewed and stable and Patient moving all extremities X 4  Post vital signs: Reviewed and stable  Last Vitals:  Vitals Value Taken Time  BP 136/78 10/07/20 15:24  Temp    Pulse 73 10/07/20 15:24  Resp 18 10/07/20 15:24  SpO2 98 10/07/20 15:24    Last Pain:  Vitals:   10/07/20 1101  TempSrc:   PainSc: 5       Patients Stated Pain Goal: 2 (27/78/24 2353)  Complications: No complications documented.

## 2020-10-07 NOTE — Brief Op Note (Signed)
10/07/2020  3:02 PM  PATIENT:  [name]  60 y.o. male  PRE-OPERATIVE DIAGNOSIS:  Stenosis  POST-OPERATIVE DIAGNOSIS:  Stenosis  PROCEDURE:  Procedure(s): POSTERIOR LUMBAR INTERBODY FUSION - LUMBAR THREE-LUMBAR FOUR (N/A)  SURGEON:  Surgeon(s) and Role:    * Earnie Larsson, MD - Primary  PHYSICIAN ASSISTANT:   ASSISTANTSMearl Latin   ANESTHESIA:   general  EBL:  500 mL   BLOOD ADMINISTERED:none  DRAINS: none   LOCAL MEDICATIONS USED:  MARCAINE     SPECIMEN:  No Specimen  DISPOSITION OF SPECIMEN:  N/A  COUNTS:  YES  TOURNIQUET:  * No tourniquets in log *  DICTATION: .Dragon Dictation  PLAN OF CARE: Admit for overnight observation  PATIENT DISPOSITION:  PACU - hemodynamically stable.   Delay start of Pharmacological VTE agent (>24hrs) due to surgical blood loss or risk of bleeding: yes

## 2020-10-08 DIAGNOSIS — M4316 Spondylolisthesis, lumbar region: Secondary | ICD-10-CM | POA: Diagnosis not present

## 2020-10-08 LAB — BASIC METABOLIC PANEL
Anion gap: 9 (ref 5–15)
BUN: 13 mg/dL (ref 8–23)
CO2: 25 mmol/L (ref 22–32)
Calcium: 9.3 mg/dL (ref 8.9–10.3)
Chloride: 102 mmol/L (ref 98–111)
Creatinine, Ser: 1.06 mg/dL (ref 0.61–1.24)
GFR, Estimated: >60 mL/min (ref >60)
Glucose, Bld: 142 mg/dL — ABNORMAL HIGH (ref 70–99)
Potassium: 4.3 mmol/L (ref 3.5–5.1)
Sodium: 136 mmol/L (ref 135–145)

## 2020-10-08 LAB — MAGNESIUM: Magnesium: 2 mg/dL (ref 1.7–2.4)

## 2020-10-08 MED ORDER — OXYCODONE HCL 10 MG PO TABS: 10.0000 mg | ORAL_TABLET | ORAL | 0 refills | Status: DC | PRN

## 2020-10-08 MED ORDER — CYCLOBENZAPRINE HCL 10 MG PO TABS: 10.0000 mg | ORAL_TABLET | Freq: Three times a day (TID) | ORAL | 0 refills | Status: DC | PRN

## 2020-10-08 MED ORDER — APIXABAN 5 MG PO TABS: 5.0000 mg | ORAL_TABLET | Freq: Two times a day (BID) | ORAL | Status: DC

## 2020-10-08 NOTE — Evaluation (Signed)
Physical Therapy Evaluation and Discharge Patient Details Name: [name] MRN: [id number] DOB: Dec 24, 1960 Today's Date: 10/08/2020   History of Present Illness  Pt is a 60 y/o male presenting with L3-L4 degenerative spondylolisthesis with stenosis s/p decompression and fusion on 2/7 by Dr. Annette Stable. PMHx significant for A-fib, CAD, NSTMI 2009, DDD, depression, HTN, HLD, Graves Disease, cardiac pacemaker, and OSA.    Clinical Impression  Patient evaluated by Physical Therapy with no further acute PT needs identified. All education has been completed and the patient has no further questions. Pt was able to demonstrate transfers and ambulation with gross modified independence and no AD. Pt was educated on precautions, brace application/wearing schedule, appropriate activity progression, and car transfer. See below for any follow-up Physical Therapy or equipment needs. Pt asking for a rollator for seated rest breaks during community mobility. PT is signing off. Thank you for this referral.     Follow Up Recommendations No PT follow up;Supervision - Intermittent    Equipment Recommendations  3in1 (PT) (shower chair, rollator)    Recommendations for Other Services       Precautions / Restrictions Precautions Precautions: Back Precaution Booklet Issued: Yes (comment) Precaution Comments: Reviewed handout and pt was cued for precautions during functional mobility. Required Braces or Orthoses: Spinal Brace Spinal Brace: Lumbar corset Restrictions Weight Bearing Restrictions: No      Mobility  Bed Mobility Overal bed mobility: Modified Independent             General bed mobility comments: Pt demonstrated good log roll technique without assist. HOB slightly elevated with min use of rails.    Transfers Overall transfer level: Modified independent Equipment used: None             General transfer comment: Min UE use to stand, but pt demonstrated proper hand placement on  seated surface for safety.  Ambulation/Gait Ambulation/Gait assistance: Modified independent (Device/Increase time) Gait Distance (Feet): 250 Feet Assistive device: None Gait Pattern/deviations: Step-through pattern;Drifts right/left;Wide base of support Gait velocity: Decreased Gait velocity interpretation: 1.31 - 2.62 ft/sec, indicative of limited community ambulator General Gait Details: Increased lateral sway noted throughout gait training. No overt LOB noted.  Stairs         General stair comments: Pt declined stair training.  Wheelchair Mobility    Modified Rankin (Stroke Patients Only)       Balance Overall balance assessment: Mild deficits observed, not formally tested                                           Pertinent Vitals/Pain Pain Assessment: 0-10 Pain Score: 6  Pain Location: Low back at incision Pain Descriptors / Indicators: Sore Pain Intervention(s): Limited activity within patient's tolerance;Monitored during session;Repositioned    Home Living Family/patient expects to be discharged to:: Private residence Living Arrangements: Alone Available Help at Discharge: Family;Friend(s);Available PRN/intermittently (Teenage children and girlfriend.) Type of Home: House Home Access: Stairs to enter   Technical brewer of Steps: 2 Home Layout: One level Home Equipment: None Additional Comments: reports having equipment from mother who recently passed but no equipment of his own    Prior Function Level of Independence: Independent               Hand Dominance   Dominant Hand: Right    Extremity/Trunk Assessment   Upper Extremity Assessment Upper Extremity Assessment: Defer to OT evaluation  Lower Extremity Assessment Lower Extremity Assessment: Generalized weakness (Consistent with pre-op diagnosis)    Cervical / Trunk Assessment Cervical / Trunk Assessment: Other exceptions Cervical / Trunk Exceptions: s/p  spinal surgery.  Communication   Communication: No difficulties  Cognition Arousal/Alertness: Awake/alert Behavior During Therapy: WFL for tasks assessed/performed Overall Cognitive Status: Within Functional Limits for tasks assessed                                        General Comments General comments (skin integrity, edema, etc.): Patient agitated upon entry declining OT evaluation stating "I have my kids and my girlfriend to do this stuff for me". Patient states that he's had multiple surgeries before. OT provided education on back precautions prior to exiting room.    Exercises     Assessment/Plan    PT Assessment Patent does not need any further PT services  PT Problem List         PT Treatment Interventions      PT Goals (Current goals can be found in the Care Plan section)  Acute Rehab PT Goals Patient Stated Goal: To return home. PT Goal Formulation: All assessment and education complete, DC therapy    Frequency     Barriers to discharge        Co-evaluation               AM-PAC PT "6 Clicks" Mobility  Outcome Measure Help needed turning from your back to your side while in a flat bed without using bedrails?: None Help needed moving from lying on your back to sitting on the side of a flat bed without using bedrails?: None Help needed moving to and from a bed to a chair (including a wheelchair)?: None Help needed standing up from a chair using your arms (e.g., wheelchair or bedside chair)?: None Help needed to walk in hospital room?: None Help needed climbing 3-5 steps with a railing? : A Little 6 Click Score: 23    End of Session Equipment Utilized During Treatment: Gait belt Activity Tolerance: Patient tolerated treatment well Patient left: in bed;with call bell/phone within reach Nurse Communication: Mobility status PT Visit Diagnosis: Unsteadiness on feet (R26.81);Pain Pain - part of body:  (back)    Time: 2248-2500 PT Time  Calculation (min) (ACUTE ONLY): 15 min   Charges:   PT Evaluation $PT Eval Low Complexity: 1 Low          Rolinda Roan, PT, DPT Acute Rehabilitation Services Pager: [phone] Office: [phone]   Thelma Comp 10/08/2020, 11:00 AM

## 2020-10-08 NOTE — Anesthesia Postprocedure Evaluation (Signed)
Anesthesia Post Note  Patient: [name]  Procedure(s) Performed: POSTERIOR LUMBAR INTERBODY FUSION - LUMBAR THREE-LUMBAR FOUR (N/A Back)     Patient location during evaluation: PACU Anesthesia Type: General Level of consciousness: awake and alert and oriented Pain management: pain level controlled Vital Signs Assessment: post-procedure vital signs reviewed and stable Respiratory status: spontaneous breathing, nonlabored ventilation and respiratory function stable Cardiovascular status: blood pressure returned to baseline Postop Assessment: no apparent nausea or vomiting Anesthetic complications: no   No complications documented.             Brennan Bailey

## 2020-10-08 NOTE — Evaluation (Signed)
Occupational Therapy Evaluation Patient Details Name: [name] MRN: [id number] DOB: [birth year] Today's Date: 10/08/2020    History of Present Illness 61 y.o. male presenting with L3-L4 degenerative spondylolisthesis with stenosis s/p decompression and fusion on 2/7 by Dr. Annette Stable. PMHx significant for A-fib, CAD, NSTMI 2009, DDD, depression, HTN, HLD, Graves Disease, cardiac pacemaker, and OSA.   Clinical Impression   PTA patient was living alone in a single-level private residence and was independent with ADLs/IADLs without AD. Patient seated EOB upon entry declining ADL portion of OT evaluation. Patient states "My girlfriend and my kids can help me with this stuff if needed". OT provided education on spinal precautions and safety with bed mobility prior to exiting room. Patient expressed verbal understanding reporting most recent back surgery 12/2019. Patient declined education on AE to assist with LB bathing/dressing while maintaining adherence to back precautions. Patient presumed to be functioning at or near baseline per clinical judgement and report from PT assessment with OT to sign off at this time per patient wishes.     Follow Up Recommendations  No OT follow up    Equipment Recommendations  None recommended by OT (Patient has necessary DME)    Recommendations for Other Services       Precautions / Restrictions Precautions Precautions: Back Precaution Booklet Issued: Yes (comment) Required Braces or Orthoses: Spinal Brace Spinal Brace: Lumbar corset Restrictions Weight Bearing Restrictions: No      Mobility Bed Mobility               General bed mobility comments: Patient seated EOB upon entry.    Transfers                 General transfer comment: Patient declined.    Balance                                           ADL either performed or assessed with clinical judgement   ADL                                                Vision         Perception     Praxis      Pertinent Vitals/Pain Pain Assessment: 0-10 Pain Score: 5  Pain Location: Low back at incision Pain Descriptors / Indicators: Sore Pain Intervention(s): Monitored during session     Hand Dominance Right   Extremity/Trunk Assessment             Communication Communication Communication: No difficulties   Cognition Arousal/Alertness: Awake/alert Behavior During Therapy: Agitated Overall Cognitive Status: Within Functional Limits for tasks assessed                                     General Comments  Patient agitated upon entry declining OT evaluation stating "I have my kids and my girlfriend to do this stuff for me". Patient states that he's had multiple surgeries before. OT provided education on back precautions prior to exiting room.    Exercises     Shoulder Instructions      Home Living Family/patient expects to be discharged to:: Private residence Living Arrangements: Alone Available  Help at Discharge: Family;Friend(s);Available PRN/intermittently (Teenage children and girlfriend.) Type of Home: House Home Access: Stairs to enter [employer] of Steps: 2   Home Layout: One level     Bathroom Shower/Tub: Teacher, early years/pre: Standard     Home Equipment: Environmental consultant - 2 wheels;Cane - single point;Bedside commode;Shower seat (Info obtained from previous admision. Patient denied having any equiptment.)          Prior Functioning/Environment Level of Independence: Independent                 OT Problem List:        OT Treatment/Interventions:      OT Goals(Current goals can be found in the care plan section) Acute Rehab OT Goals Patient Stated Goal: For OT to leave the room.  OT Frequency:     Barriers to D/C:            Co-evaluation              AM-PAC OT "6 Clicks" Daily Activity     Outcome Measure                  End of Session    Activity Tolerance:   Patient left:                     Time: 7939-0300 OT Time Calculation (min): 10 min Charges:  OT General Charges $OT Visit: 1 Visit  Gloris Manchester OTR/L Supplemental OT, Department of rehab services [phone]  Tamekia Rotter R H. 10/08/2020, 8:43 AM

## 2020-10-08 NOTE — Discharge Instructions (Signed)
Restart Eliquis 10/12/2020  Wound Care Keep incision covered and dry for two days.    Do not put any creams, lotions, or ointments on incision. Leave steri-strips on back.  They will fall off by themselves. Activity Walk each and every day, increasing distance each day. No lifting greater than 5 lbs.  No driving for 2 weeks; may ride as a passenger locally. Diet Resume your normal diet.  Return to Work Will be discussed at your follow up appointment. Call Your Doctor If Any of These Occur Redness, drainage, or swelling at the wound.  Temperature greater than 101 degrees. Severe pain not relieved by pain medication. Incision starts to come apart. Follow Up Appt Call today for appointment in 1-2 weeks [phone]) or for problems.

## 2020-10-08 NOTE — Discharge Summary (Signed)
Physician Discharge Summary     Providing Compassionate, Quality Care - Together   Patient ID: [name] MRN: [id number] DOB/AGE: 1960/10/30 60 y.o.  Admit date: 10/07/2020 Discharge date: 10/08/2020  Admission Diagnoses: Degenerative spondylolisthesis  Discharge Diagnoses:  Active Problems:   Degenerative spondylolisthesis   Discharged Condition: good  Hospital Course: Patient underwent extension of his L4-5 fusion to L3-4 with decompression and fusion of L3-4 by Dr. Annette Stable on 10/07/2020. He was admitted to 3C05 overnight for observation. His postoperative course has been uncomplicated. He has worked with both physical and occupational therapies who feel he is ready for discharge home. He is ambulating independently and without difficulty. He is tolerating a normal diet. He is not having any bowel or bladder dysfunction. His pain is well-controlled with oral pain medication. He is ready for discharge home.   Consults: rehabilitation medicine  Significant Diagnostic Studies: radiology: DG Lumbar Spine 2-3 Views  Result Date: 10/07/2020 CLINICAL DATA:  Posterior fusion EXAM: LUMBAR SPINE - 2-3 VIEW; DG C-ARM 1-60 MIN COMPARISON:  Lumbar CT March 08, 2020 FLUOROSCOPY TIME:  0 minutes 23 seconds; 36.11 mGy; 2 acquired images FINDINGS: Frontal and lateral views obtained. There are pedicle screws from a posterior approach at L3, L4, and L5 with screw tips in the respective vertebral bodies. There is an apparent new disc spacer at L3-4 with an older disc spacer at L4-5 which appears unchanged in position. No evident fracture or spondylolisthesis. Moderate disc space narrowing again noted at L4-5. IMPRESSION: Pedicle screws bilaterally at L3, L4, and L5 with screw tips in respective vertebral bodies. Disc spacers at L3-4 and L4-5. No fracture or spondylolisthesis. Moderate disc space narrowing again noted at L4-5. Electronically Signed   By: Lowella Grip III M.D.   On: 10/07/2020 15:01    DG C-Arm 1-60 Min  Result Date: 10/07/2020 CLINICAL DATA:  Posterior fusion EXAM: LUMBAR SPINE - 2-3 VIEW; DG C-ARM 1-60 MIN COMPARISON:  Lumbar CT March 08, 2020 FLUOROSCOPY TIME:  0 minutes 23 seconds; 36.11 mGy; 2 acquired images FINDINGS: Frontal and lateral views obtained. There are pedicle screws from a posterior approach at L3, L4, and L5 with screw tips in the respective vertebral bodies. There is an apparent new disc spacer at L3-4 with an older disc spacer at L4-5 which appears unchanged in position. No evident fracture or spondylolisthesis. Moderate disc space narrowing again noted at L4-5. IMPRESSION: Pedicle screws bilaterally at L3, L4, and L5 with screw tips in respective vertebral bodies. Disc spacers at L3-4 and L4-5. No fracture or spondylolisthesis. Moderate disc space narrowing again noted at L4-5. Electronically Signed   By: Lowella Grip III M.D.   On: 10/07/2020 15:01     Treatments: surgery:   Redo L3-L4 decompressive laminotomies with bilateral foraminotomies, more than would be required for simple interbody fusion alone.  L3-4 posterior lumbar interbody fusion utilizing interbody cages, morselized autograft, and morselized allograft, L3-4 posterior lateral arthrodesis utilizing nonsegmental pedicle screw fixation and local autografting.  Reexploration of L4-5 posterior lateral fusion with removal of hardware  Discharge Exam: Blood pressure 139/78, pulse 75, temperature 98.7 F (37.1 C), temperature source Oral, resp. rate 18, height 5\' 11"  (1.803 m), weight 131.1 kg, SpO2 96 %.  Alert and oriented x 4 PERRLA CN II-XII grossly intact MAE, Strength and sensation intact Incision is covered with Honeycomb dressing and Steri Strips; Dressing is clean, dry, and intact   Disposition: Discharge disposition: 01-Home or Self Care  Allergies as of 10/08/2020      Reactions   Celecoxib Itching   Hydrocodone Itching   Prednisolone Itching   Potassium  Chloride Other (See Comments)   Dizziness and nausea       Medication List    STOP taking these medications   ibuprofen 200 MG tablet Commonly known as: ADVIL   oxyCODONE-acetaminophen 10-325 MG tablet Commonly known as: Percocet     TAKE these medications   apixaban 5 MG Tabs tablet Commonly known as: Eliquis Take 1 tablet (5 mg total) by mouth 2 (two) times daily. Restart on 10/12/2020 What changed:   how much to take  additional instructions   atorvastatin 40 MG tablet Commonly known as: LIPITOR Take 40 mg by mouth daily.   carvedilol 25 MG tablet Commonly known as: COREG TAKE 1 TABLET (25 MG TOTAL) BY MOUTH 2 (TWO) TIMES DAILY WITH A MEAL.   cyclobenzaprine 10 MG tablet Commonly known as: FLEXERIL Take 1 tablet (10 mg total) by mouth 3 (three) times daily as needed for muscle spasms.   desonide 0.05 % cream Commonly known as: DESOWEN Apply 1 application topically 2 (two) times daily as needed (for rash).   dexlansoprazole 60 MG capsule Commonly known as: Dexilant Take 1 capsule (60 mg total) by mouth daily.   dofetilide 500 MCG capsule Commonly known as: TIKOSYN Take 1 capsule (500 mcg total) by mouth 2 (two) times daily.   gabapentin 100 MG capsule Commonly known as: NEURONTIN Take 1-2 capsules (100-200 mg total) by mouth See admin instructions. Patient takes 1 tablet in the morning and 2 tablets at night   methimazole 10 MG tablet Commonly known as: TAPAZOLE Take 1 tablet (10 mg total) by mouth daily.   Oxycodone HCl 10 MG Tabs Take 1 tablet (10 mg total) by mouth every 4 (four) hours as needed for severe pain ((score 7 to 10)).   triamcinolone ointment 0.1 % Commonly known as: KENALOG Apply 1 application topically 2 (two) times daily as needed (for rash).            Durable Medical Equipment  (From admission, onward)         Start     Ordered   10/07/20 1621  DME Walker rolling  Once       Question:  Patient needs a walker to treat with  the following condition  Answer:  Degenerative spondylolisthesis   10/07/20 1621   10/07/20 1621  DME 3 n 1  Once        10/07/20 1621          Follow-up Information    Pool, Mallie Mussel, MD. Schedule an appointment as soon as possible for a visit in 2 week(s).   Specialty: Neurosurgery Contact information: [address] [place] [address] [phone]               Signed: Patricia Nettle 10/08/2020, 9:24 AM

## 2020-10-08 NOTE — Plan of Care (Signed)
Patient and spouse given discharged instructions with stated understanding of all instructions given. Spouse had all patient's belongings. Patient has an appointment with Dr. Annette Stable in 2 weeks

## 2020-10-09 ENCOUNTER — Encounter (HOSPITAL_COMMUNITY): Payer: Self-pay | Admitting: Neurosurgery

## 2020-10-09 MED FILL — Heparin Sodium (Porcine) Inj 1000 Unit/ML: INTRAMUSCULAR | Qty: 30 | Status: AC

## 2020-10-09 MED FILL — Sodium Chloride IV Soln 0.9%: INTRAVENOUS | Qty: 2000 | Status: AC

## 2020-10-15 DIAGNOSIS — R5383 Other fatigue: Secondary | ICD-10-CM | POA: Diagnosis not present

## 2020-10-15 DIAGNOSIS — Z0000 Encounter for general adult medical examination without abnormal findings: Secondary | ICD-10-CM | POA: Diagnosis not present

## 2020-10-15 DIAGNOSIS — Z1152 Encounter for screening for COVID-19: Secondary | ICD-10-CM | POA: Diagnosis not present

## 2020-10-20 NOTE — Progress Notes (Signed)
Cardiology Office Note Date:  10/20/2020  Patient ID:  [name], [name] 06/16/1961, MRN [id number] PCP:  [facility] [place]  Electrophysiologist: *Dr. [name] (PVI ablations, Dr. Rayann Heman)    Chief Complaint:  Post hospital  History of Present Illness: [name] is a 59 y.o. male with history of NSTEMI (Catheterization was done in 2006 in 2009, 2017  showing nonobstructive disease), PE, NICM, HTN, HLD, hyperthyroid (Grave's), sinus node dysfunction w/PPM (A lead only), OSA w/CPAP.  She comes in today to be seen for dr. [name], last seen by him Sept 2020, she overall was doing well, feeling better and no changes were made.  He saw A. Lynnell Jude, NP Jan 2022 for pre-op clearance for back surgery, felt to be moderate risk.  He underwent back surgery admitted 10/07/20 and discharged 10/08/20.  ER visit 10/11/20 with c/o CP ddimer was elevated 2.51 Trop T 11 > 9 > 9 CT angiochest Impression:  IMPRESSION: No pulmonary emboli within either lung. Subtle groundglass opacities within both lower lobes likely related to air trapping although infiltrates cannot be completely excluded. Fatty infiltration of the liver. Moderate coronary artery calcification.  Discharged from the ER to follow up here   TODAY He has not had any further CP> He continues unfortunately with severe back pain despite his surgery. No palpitations, no dizzy spells, near syncope or syncope. No bleeding or signs of bleeding.  Back on his Eliquis.  He mentions since his surgery he is waking with night sweats, they are slowly improvong, but continue and interrupt his sleep. He is wearing his CPAP, no change isn his pain meds He has spoken to his PMD who did labs, and tested COVID was neg. No fever or symptoms of illness. WBC was wnl   Device information MDT dual chamber PPM implanted 2009, A lead only, RV port is plugged  Afib Hx Diagnosed 2004 PVI ablation 2012 (Dr. Rayann Heman) 05/06/2017: PVI, CTI ablation  (Dr. Rayann Heman)  AAD 2010 started on Tikosyn is current  Past Medical History:  Diagnosis Date  . Atrial fibrillation ([place])    s/p afib ablation  PVI 5/12 JA  . CAD -nonobstructive    S/P nstemi-type II cath 05/2008 - nonobs. dzs.  . Cataract    left eye 2019  . Colon polyps   . Complication of anesthesia    last 2 surgeries pt had some respiratory issues following surgery required ED visit, dx with bronchitis.   . DDD (degenerative disc disease)    evaluate by neurosurgery is ongoing  . Depression    ptsd  . Duodenal adenoma   . Erectile dysfunction   . GERD (gastroesophageal reflux disease)   . Headache(784.0)   . Hemorrhoids   . HTN (hypertension)   . Hyperlipemia   . Hyperthyroidism    Graves dz on methimazole and followed by [name]  . Myocardial infarction ([place])    2009  . Obesity   . Pancreatitis   . Polysubstance abuse ([place])    cocaine, last used 1999  . Presence of permanent cardiac pacemaker    since 2009  . PUD (peptic ulcer disease)    gastritis due to ETOH previously  . Pulmonary embolism ([place]) 04/2003   Hx of  . Rectal bleeding   . Sinus node dysfunction ([facility])    s/p MDT PPM - AAI - V port plugged  . Sleep apnea    uses CPAP [facility] 2 YRS  . Systolic and diastolic CHF, chronic ([place])  EF 35% by most recent echo 09/19/10  . 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 ANTERIOR CERVICAL DECOMP/DISCECTOMY FUSION  12/15/2011   Procedure: ANTERIOR CERVICAL DECOMPRESSION/DISCECTOMY FUSION 1 LEVEL;  Surgeon: [name], MD;  Location: [facility];  Service: Neurosurgery;  Laterality: N/A;  Cervical Six-Seven Anterior Cervical Diskectomy fusion with Allograft and Plating  . ANTERIOR CERVICAL DECOMP/DISCECTOMY FUSION N/A 01/01/2020   Procedure: Anterior Cervical Discectomy Fusion - Cervical Seven- Thoracic One;  Surgeon: Earnie Larsson, MD;  Location: [facility];  Service: Neurosurgery;   Laterality: N/A;  Anterior Cervical Discectomy Fusion - Cervical Seven- Thoracic One  . ATRIAL ABLATION SURGERY  12/2010   afib ablation by [name]  . ATRIAL FIBRILLATION ABLATION N/A 05/06/2017   Procedure: Atrial Fibrillation Ablation;  Surgeon: Thompson Grayer, MD;  Location: [facility];  Service: Cardiovascular;  Laterality: N/A;  . BACK SURGERY    . CARDIAC CATHETERIZATION     CADIAC ABLATION [name] 12/2010  . CARDIAC CATHETERIZATION N/A 05/08/2016   Procedure: Left Heart Cath and Coronary Angiography;  Surgeon: Peter M Martinique, MD;  Location: [facility];  Service: Cardiovascular;  Laterality: N/A;  . Fingers removed from right hand.  2/84   traumatic work injury  . INSERT / REPLACE / REMOVE PACEMAKER     05/2008    . LUMBAR SPINE SURGERY  02/2009   Dr. Trenton Gammon  . PACEMAKER INSERTION  10/09   by Dr [name]  . TEE WITHOUT CARDIOVERSION N/A 05/05/2017   Procedure: TRANSESOPHAGEAL ECHOCARDIOGRAM (TEE);  Surgeon: Larey Dresser, MD;  Location: [facility];  Service: Cardiovascular;  Laterality: N/A;    Current Outpatient Medications  Medication Sig Dispense Refill  . apixaban (ELIQUIS) 5 MG TABS tablet Take 1 tablet (5 mg total) by mouth 2 (two) times daily. Restart on 10/12/2020    . atorvastatin (LIPITOR) 40 MG tablet Take 40 mg by mouth daily.    . carvedilol (COREG) 25 MG tablet TAKE 1 TABLET (25 MG TOTAL) BY MOUTH 2 (TWO) TIMES DAILY WITH A MEAL. 180 tablet 2  . cyclobenzaprine (FLEXERIL) 10 MG tablet Take 1 tablet (10 mg total) by mouth 3 (three) times daily as needed for muscle spasms. 30 tablet 0  . desonide (DESOWEN) 0.05 % cream Apply 1 application topically 2 (two) times daily as needed (for rash).     [name] dexlansoprazole (DEXILANT) 60 MG capsule Take 1 capsule (60 mg total) by mouth daily. 90 capsule 3  . dofetilide (TIKOSYN) 500 MCG capsule Take 1 capsule (500 mcg total) by mouth 2 (two) times daily. 180 capsule 1  . gabapentin (NEURONTIN) 100 MG capsule Take 1-2  capsules (100-200 mg total) by mouth See admin instructions. Patient takes 1 tablet in the morning and 2 tablets at night 60 capsule 3  . methimazole (TAPAZOLE) 10 MG tablet Take 1 tablet (10 mg total) by mouth daily. 30 tablet 1  . oxyCODONE 10 MG TABS Take 1 tablet (10 mg total) by mouth every 4 (four) hours as needed for severe pain ((score 7 to 10)). 30 tablet 0  . triamcinolone ointment (KENALOG) 0.1 % Apply 1 application topically 2 (two) times daily as needed (for rash).   3   No current facility-administered medications for this visit.    Allergies:   Celecoxib, Hydrocodone, Prednisolone, and Potassium chloride   Social History:  The patient  reports that he  quit smoking about 17 years ago. His smoking use included cigarettes. He has a 15.00 pack-year smoking history. He has never used smokeless tobacco. He reports that he does not drink alcohol and does not use drugs.   Family History:  The patient's family history includes Breast cancer in his mother; Diabetes in an other family member; Heart disease in his mother; Lung cancer in his father.  ROS:  Please see the history of present illness.    All other systems are reviewed and otherwise negative.   PHYSICAL EXAM:  VS:  There were no vitals taken for this visit. BMI: There is no height or weight on file to calculate BMI. Well nourished, well developed, in no acute distress HEENT: normocephalic, atraumatic Neck: no JVD, carotid bruits or masses Cardiac:  RRR; no significant murmurs, no rubs, or gallops Lungs:  CTA b/l, no wheezing, rhonchi or rales Abd: soft, nontender MS: no deformity or atrophy Ext: no edema Skin: warm and dry, no rash Neuro:  No gross deficits appreciated Psych: euthymic mood, full affect  PPM site is stable, no tethering or discomfort   EKG:  Done today and reviewed by myself shows  SR 67bpm, QTc stable, 467ms  Device interrogation done today and reviewed by myself:  Battery and lead measurements  are good 3 AHR in Jan, longest 44seconds, A lead only, c/w ST/AT, CL 347ms AP 1.9%   05/06/2017: EPS/ablation CONCLUSIONS: 1. Sinus rhythm upon presentation.   2. Intracardiac echo reveals a moderately enlarged left atrium with four separate pulmonary veins without evidence of pulmonary vein stenosis. 3. The left superior and inferior pulmonary veins were quiescent from a prior ablation procedure.  These veins did not require re-isolation.  Minimal ablation was performed along the carina between the LIPV and LSPV.   4. Prodigious electrical activity within the right superior pulmonary vein.  Ectopic atrial tachycardia and premature atrial contractions were frequently noted to arise from this vein.  There was minimal conduction within the right inferior pulmonary vein. 5. Successful electrical re-isolation and anatomical encircling of the right superior pulmonary vein using radiofrequency current.   A WACA approach was used around the RSPV and RIPV.   6. Premature atrial contractions and nonsustained ectopic atrial tachycardia terminated with isolation of the RSPV which was felt to be the culpril for his arrhythmias today. 7. CTI ablation performed with complete bidirectional isthmus block achieved 8. No inducible arrhythmias post ablation with isuprel and adenosine infused 9. No early apparent complications    10/04/5359: TEE Study Conclusions  - Left ventricle: The cavity size was mildly dilated. Wall  thickness was normal. Systolic function was moderately reduced.  The estimated ejection fraction was in the range of 35% to 40%.  Diffuse hypokinesis.  - Aortic valve: There was no stenosis. There was trivial  regurgitation.  - Aorta: Normal caliber thoracic aorta with mild plaque.  - Mitral valve: There was trivial regurgitation.  - Left atrium: The atrium was moderately dilated. No evidence of  thrombus in the atrial cavity or appendage. No evidence of  thrombus in the atrial  cavity or appendage.  - Right ventricle: The cavity size was mildly dilated. Systolic  function was mildly reduced.  - Right atrium: No evidence of thrombus in the atrial cavity or  appendage.  - Atrial septum: No defect or patent foramen ovale was identified.   Impressions:  - No LA thrombus, ok for ablation.     05/08/2016: LHC  Prox LAD to Mid LAD lesion,  25 %stenosed.  Prox Cx to Mid Cx lesion, 30 %stenosed.  Mid RCA lesion, 30 %stenosed.  Acute Mrg lesion, 60 %stenosed.  There is mild to moderate left ventricular systolic dysfunction.  LV end diastolic pressure is normal.  The left ventricular ejection fraction is 45-50% by visual estimate.   1. Nonobstructive CAD 2. Mild to moderate LV dysfunction. EF 45% 3. Normal LV filling pressures.  Plan: medical management.     01/17/2011: EPS/Ablation CONCLUSIONS: 1. Paroxysmal atrial fibrillation upon presentation. 2. The patient was noted to have a very large right superior pulmonary     vein as well as a large right inferior pulmonary vein.  The left     superior and left inferior pulmonary veins were moderate in size.     There was a left middle as well as a right middle pulmonary vein     which arose from the left superior pulmonary vein and right     inferior pulmonary vein respectively.  The patient was noted to     have prodigious conduction from the right superior pulmonary vein     which was felt to be his trigger for atrial fibrillation. 3. Successful electrical isolation and anatomical encircling of all 4     pulmonary veins using radiofrequency current. 4. No inducible arrhythmias with isoproterenol infusion following the     ablation up to 10 mcg per minute. 5. Pacemaker interrogation following ablation reveals stable lead     measurements. 6. No early apparent complications.   Recent Labs: 12/26/2019: ALT 23 10/01/2020: Hemoglobin 14.0; Platelets 231 10/08/2020: BUN 13; Creatinine, Ser 1.06;  Magnesium 2.0; Potassium 4.3; Sodium 136  No results found for requested labs within last 8760 hours.   Estimated Creatinine Clearance: 101 mL/min (by C-G formula based on SCr of 1.06 mg/dL).   Wt Readings from Last 3 Encounters:  10/07/20 289 lb (131.1 kg)  10/01/20 292 lb (132.5 kg)  09/24/20 285 lb 12.8 oz (129.6 kg)     Other studies reviewed: Additional studies/records reviewed today include: summarized above  ASSESSMENT AND PLAN:  1. PPM     Intact function, no programming changes made  2. Paroxysmal Afib     CHA2DS2Vasc is 3, on eliquis, appropriately dosed     well controlled     Tikosyn, QTc stable      Labs looked OK  3. HTN     Looks good  4. NICM     LVEF 35-40% by last echo in 2018     No symptoms or exam findings of volume OL     Update his echo  5. CP     Not recurrent     R/o with neg Trop, atypical sounding  6. Night sweats     Doesn't sound cardiac        F/u with PMD and surgeon   Disposition: F/u with remotes as usual and in clinic in 39mo, sooner if needed.  Current medicines are reviewed at length with the patient today.  The patient did not have any concerns regarding medicines.  Venetia Night, PA-C 10/20/2020 10:17 AM     [facility] [address] [place] [address] [phone] (office)  [phone] (fax)

## 2020-10-21 ENCOUNTER — Other Ambulatory Visit: Payer: Self-pay

## 2020-10-21 ENCOUNTER — Ambulatory Visit: Payer: Medicaid Other | Admitting: Internal Medicine

## 2020-10-21 ENCOUNTER — Encounter: Payer: Self-pay | Admitting: Internal Medicine

## 2020-10-21 DIAGNOSIS — G4733 Obstructive sleep apnea (adult) (pediatric): Secondary | ICD-10-CM

## 2020-10-21 DIAGNOSIS — M51379 Other intervertebral disc degeneration, lumbosacral region without mention of lumbar back pain or lower extremity pain: Secondary | ICD-10-CM

## 2020-10-21 DIAGNOSIS — M5137 Other intervertebral disc degeneration, lumbosacral region: Secondary | ICD-10-CM

## 2020-10-21 NOTE — Patient Instructions (Signed)
Try Biotene mouth rinse at bedtime for dry mouth  When we can acces a download  For your machine I will see if there is room to turndown CPAP pressure to help with dryness. You can also call your home care company to see if they have any idea when your new machine will be aavailable.

## 2020-10-21 NOTE — Progress Notes (Signed)
HPI   male former smoker followed for OSA, complicated by CAD/CHF/P A. fib/pacemaker/Pradaxa, allergic rhinitis, hyperthyroid,  degenerative disc disease, GERD NPSG 2010- AHI 21/ hr  ---------------------------------------------------------------------------------------   05/20/20-  60 year old male former smoker followed for OSA, complicated by CAD/CHF/P A. fib/pacemaker/Pradaxa, allergic rhinitis, hyperthyroid, degenerative disc disease, GERD, Hx PE 2004 CPAP auto 5-15/[facility]/ [facility]  Download- compliance 100%, AHI 2.6/ hr Body weight today- 280 lbs Covid vax- Flu vax- 2 Phizer Flu vax- declined Two or three nights/ week he has to wipe out mouth for excess saliva. He asks if it is from the CPAP. No evident postnasal drip, reflux. Possibly related to orthostatic fluid shifts given his CHF. Following recommended CPAP cleaning.  CXR- 05/18/19- IMPRESSION: 1. No acute cardio pulmonary abnormalities. 2. Low lung volumes.  10/21/20- 60 year old male former smoker followed for OSA, complicated by CAD/CHF/P A. fib/Pacemaker/Pradaxa, allergic rhinitis, hyperthyroid, degenerative disc disease, GERD, Hx PE 2004 CPAP auto 5-15/[facility]/ [facility] Download- Body weight today- Covid vax-2 Phizer Flu vax-none Hosp- Lumbar spine fusion NSGY. Just getting ambulatory and able to drive after back surgery. Still waiting for replacement machine. Complains of dry mouth despite adjusting humdifier, Having night sweats since spine surgery- getting better, but still disturbs sleep. Doesn't recognize other symptoms of infection. PCP has done labs- pending.   ROS-see HPI   + = positive Constitutional:    weight loss, night sweats, fevers, chills, fatigue, lassitude. HEENT:    headaches, difficulty swallowing, tooth/dental problems, sore throat,       sneezing, itching, ear ache, +nasal congestion, post nasal drip, snoring CV:    +chest pain, orthopnea, PND, swelling in lower extremities, anasarca,                                                     dizziness, palpitations Resp:   +shortness of breath with exertion or at rest.                +productive cough,   non-productive cough, coughing up of blood.              change in color of mucus.  wheezing.   Skin:    rash or lesions. GI:  No-   heartburn, indigestion, abdominal pain, nausea, vomiting GU: . MS:   joint pain, stiffness,  Neuro-     nothing unusual Psych:  change in mood or affect.  depression or anxiety.   memory loss.  OBJ- Physical Exam General- Alert, Oriented, Affect-appropriate, Distress- none acute, + obese Skin- rash-none, lesions- none, excoriation- none Lymphadenopathy- none Head- atraumatic            Eyes- Gross vision intact, PERRLA, conjunctivae and secretions clear            Ears- Hearing, canals-normal            Nose- , + turbinate edema R, no-Septal dev, mucus, polyps, erosion, perforation             Throat- Mallampati III-IV , mucosa clear/not dry , drainage- none, tonsils- atrophic Neck- flexible , trachea midline, no stridor , thyroid nl, carotid no bruit Chest - symmetrical excursion , unlabored           Heart/CV- RRR , no murmur , no gallop  , no rub, nl s1 s2                           -  JVD- none , edema- none, stasis changes- none, varices- none           Lung- clear to P&A, wheeze- none, cough- none , dullness-none, rub- none           Chest wall- L pacemaker Abd-  Br/ Gen/ Rectal- Not done, not indicated Extrem- cyanosis- none, clubbing, none, atrophy- none, strength- nl. +Missing distal fingers R hand. Neuro- grossly intact to observation

## 2020-10-22 ENCOUNTER — Encounter: Payer: Self-pay | Admitting: Internal Medicine

## 2020-10-22 DIAGNOSIS — M48061 Spinal stenosis, lumbar region without neurogenic claudication: Secondary | ICD-10-CM | POA: Diagnosis not present

## 2020-10-22 NOTE — Assessment & Plan Note (Signed)
Benefits from CPAP by report, pending download. Dry mouth might respond to lower pressure, but need to see download first to see if there is room to do this w/o sacrificing control. Plan- adding AirView, continue auto [id number]

## 2020-10-22 NOTE — Assessment & Plan Note (Signed)
Just had extension of spine surgery. Have to wonder if night sweats are related. He is pending surgical f/u and is instructed to discuss with surgeon.

## 2020-10-25 ENCOUNTER — Ambulatory Visit: Payer: Medicaid Other | Admitting: Physician Assistant

## 2020-10-25 ENCOUNTER — Encounter: Payer: Self-pay | Admitting: Physician Assistant

## 2020-10-25 ENCOUNTER — Other Ambulatory Visit: Payer: Self-pay

## 2020-10-25 VITALS — BP 128/78 | HR 67 | Ht 71.0 in | Wt 276.0 lb

## 2020-10-25 DIAGNOSIS — I5022 Chronic systolic (congestive) heart failure: Secondary | ICD-10-CM | POA: Diagnosis not present

## 2020-10-25 DIAGNOSIS — I480 Paroxysmal atrial fibrillation: Secondary | ICD-10-CM

## 2020-10-25 DIAGNOSIS — Z79899 Other long term (current) drug therapy: Secondary | ICD-10-CM

## 2020-10-25 DIAGNOSIS — I10 Essential (primary) hypertension: Secondary | ICD-10-CM

## 2020-10-25 DIAGNOSIS — I428 Other cardiomyopathies: Secondary | ICD-10-CM

## 2020-10-25 DIAGNOSIS — I5033 Acute on chronic diastolic (congestive) heart failure: Secondary | ICD-10-CM | POA: Diagnosis not present

## 2020-10-25 DIAGNOSIS — Z950 Presence of cardiac pacemaker: Secondary | ICD-10-CM

## 2020-10-25 LAB — CUP PACEART INCLINIC DEVICE CHECK
Battery Impedance: 1417 Ohm
Battery Remaining Longevity: 67 mo
Battery Voltage: 2.77 V
Brady Statistic RA Percent Paced: 2 %
Date Time Interrogation Session: 20220225112640
Implantable Lead Implant Date: 20091023
Implantable Lead Location: 753859
Implantable Lead Model: 5076
Implantable Pulse Generator Implant Date: 20091023
Lead Channel Impedance Value: 430 Ohm
Lead Channel Impedance Value: 67 Ohm
Lead Channel Pacing Threshold Amplitude: 1 V
Lead Channel Pacing Threshold Pulse Width: 0.15 ms
Lead Channel Sensing Intrinsic Amplitude: 2 mV
Lead Channel Setting Pacing Amplitude: 2 V

## 2020-10-25 MED ORDER — DOFETILIDE 500 MCG PO CAPS: 500.0000 ug | ORAL_CAPSULE | Freq: Two times a day (BID) | ORAL | 3 refills | Status: DC

## 2020-10-25 NOTE — Patient Instructions (Signed)
Medication Instructions:   Your physician recommends that you continue on your current medications as directed. Please refer to the Current Medication list given to you today.  *If you need a refill on your cardiac medications before your next appointment, please call your pharmacy*   Lab Work: [place]   If you have labs (blood work) drawn today and your tests are completely normal, you will receive your results only by: [name] MyChart Message (if you have MyChart) OR . A paper copy in the mail If you have any lab test that is abnormal or we need to change your treatment, we will call you to review the results.   Testing/Procedures: Your physician has requested that you have an echocardiogram. Echocardiography is a painless test that uses sound waves to create images of your heart. It provides your doctor with information about the size and shape of your heart and how well your heart's chambers and valves are working. This procedure takes approximately one hour. There are no restrictions for this procedure.    Follow-Up: At [facility], you and your health needs are our priority.  As part of our continuing mission to provide you with exceptional heart care, we have created designated Provider Care Teams.  These Care Teams include your primary Cardiologist (physician) and Advanced Practice Providers (APPs -  Physician Assistants and Nurse Practitioners) who all work together to provide you with the care you need, when you need it.  We recommend signing up for the patient portal called "MyChart".  Sign up information is provided on this After Visit Summary.  MyChart is used to connect with patients for Virtual Visits (Telemedicine).  Patients are able to view lab/test results, encounter notes, upcoming appointments, etc.  Non-urgent messages can be sent to your provider as well.   To learn more about what you can do with MyChart, go to [email or web address].    Your next  appointment:   6 month(s)  The format for your next appointment:   In Person  Provider:   Tommye Standard, PA-C   Other Instructions

## 2020-10-27 ENCOUNTER — Other Ambulatory Visit: Payer: Self-pay | Admitting: Internal Medicine

## 2020-11-20 ENCOUNTER — Ambulatory Visit (HOSPITAL_COMMUNITY): Payer: Medicaid Other | Attending: Internal Medicine

## 2020-11-20 ENCOUNTER — Other Ambulatory Visit: Payer: Self-pay

## 2020-11-20 DIAGNOSIS — I428 Other cardiomyopathies: Secondary | ICD-10-CM

## 2020-11-20 LAB — ECHOCARDIOGRAM COMPLETE
Area-P 1/2: 3.03 cm2
S' Lateral: 3.3 cm

## 2020-12-28 ENCOUNTER — Other Ambulatory Visit: Payer: Self-pay | Admitting: Internal Medicine

## 2020-12-30 NOTE — Telephone Encounter (Addendum)
Eliquis 5mg  refill request received. Patient is 60 years old, weight-125.2kg, Crea-1.06 on 10/08/2020, Diagnosis-Afib, and last seen by Tommye Standard on 10/25/2020 [facility]). Dose is appropriate based on dosing criteria. Will send in refill to requested pharmacy.

## 2020-12-31 ENCOUNTER — Telehealth: Payer: Self-pay

## 2020-12-31 NOTE — Telephone Encounter (Signed)
The patient phone is not compatible with his app. [facility] ordered him a monitor. The monitor is on back order for a month and a half. The patient was happy with that.

## 2021-02-17 ENCOUNTER — Ambulatory Visit (INDEPENDENT_AMBULATORY_CARE_PROVIDER_SITE_OTHER): Payer: Medicaid Other

## 2021-02-17 DIAGNOSIS — I495 Sick sinus syndrome: Secondary | ICD-10-CM | POA: Diagnosis not present

## 2021-02-17 LAB — CUP PACEART REMOTE DEVICE CHECK
Battery Impedance: 1523 Ohm
Battery Remaining Longevity: 64 mo
Battery Voltage: 2.77 V
Brady Statistic RA Percent Paced: 4 %
Date Time Interrogation Session: 20220619101748
Implantable Lead Implant Date: 20091023
Implantable Lead Location: 753859
Implantable Lead Model: 5076
Implantable Pulse Generator Implant Date: 20091023
Lead Channel Impedance Value: 441 Ohm
Lead Channel Impedance Value: 67 Ohm
Lead Channel Setting Pacing Amplitude: 2 V

## 2021-02-24 ENCOUNTER — Telehealth: Payer: Self-pay | Admitting: Internal Medicine

## 2021-02-24 NOTE — Telephone Encounter (Signed)
ATC no voice mail will attempt to contact at later time.

## 2021-02-24 NOTE — Telephone Encounter (Signed)
I called and spoke with the pt  He states still has not received his CPAP machine or new supplies from [facility]  He using old supplies and feels this is contributing to his sinus issues  He has been having facial pressure, PND, cough with clear sputum x 6 wks- taking multiple otc meds-mucinex, sudafed , dayquil, alkaseltzer w/o help  He states he orders supplies every month and [facility] tells him that they will be delivered but they never are  I called [employer] and spoke with Leroy Sea  Was advised that pt is one step closer to getting his CPAP bc in the system he can see that he has been assigned an individual and now they are just waiting on the machine .  He states he can not tell exactly what happened with the supplies, so he will msg the supply team to reach out to pt  [name], so you have any recommendations to help with pt's symptoms above in bold? Will await response and then contact pt with update about CPAP/supplies

## 2021-02-24 NOTE — Telephone Encounter (Signed)
Regarding his current symptoms - recommend covid testing otherwise no additional treatment at this time. Does not seem bacterial so no antibiotics warranted. If he develops new fever, worsening cough, he will need a visit if covid testing is negative.  Regarding his CPAP. Patient will need to wait on delivery of CPAP supplies. In the meantime, please advise patient on proper CPAP care for his current device. Please send copy of instructions via mychart or mail.    CPAP CLEANING INSTRUCTIONS Along with proper CPAP cleaning it is recommended that you replace your mask, tubing and filters once very 3 months and more frequently if you are sick.  DAILY CLEANING Do NOT use moisturizing soaps, bleach, scented oils, chlorine, or alcohol-based solutions to clean your supplies. These solutions may cause irritation to your skin and lungs and may reduce the life of your products. Dawn [employer] or Comparable works best for daily cleaning.  **If you've been sick, it's smart to wash your mask, tubing, humidifier and filter daily until your cold, flu or virus symptoms are gone. That can help reduce the amount of time you spend under the weather.  Before using your mask -wash your face daily with soap and water to remove excess facial oils.  Wipe down your mask (including areas that come in contact with your skin) using a damp towel with soap and warm water. This will remove any oils, dead skin cells, and sweat on the mask that can affect the quality of the seal. Gently rinse with a clean towel and let the mask air-dry out of direct sunlight.  You can also use unscented baby wipes or pre-moistened towels designed specifically for cleaning CPAP masks, which are available on-line. DO NOT USE CLOROX OR DISINFECTING WIPES.  If your unit has a humidifier, empty any leftover water instead of letting in sit in the unit all day. Refill the humidifier with clean, distilled water right before bedtime for optimal  use  WEEKLY (OR MORE FREQUENT) CLEANING Your mask and tubing need a full bath at least once a week to keep it free of dust, bacteria, and germs. (During COVID-19 or any other flu/virus we recommend more frequent cleaning)  Clean the CPAP tubing, nasal mask, and headgear in a bathroom sink filled with warm water and a few drops of ammonia-free, mild dish detergent. Avoid using stronger cleaning products, as they may damage the mask or leave harmful residue.   Swirl all parts around for about five minutes, rinse well and let air dry during the day. Hang the tubing over the shower rod, on a towel rack or in the laundry room to ensure all the water drips out.  The mask and headgear can be air-dried on a towel or hung on a hook or hanger.  You should also wipe down your CPAP machine with a damp cloth. Ensure the unit is unplugged. The towel shouldn't be too damp or wet, as water could get into the machine.  Clean the filter by removing it and rinsing it in warm tap water. Run it under the water and squeeze to make sure there is no dust. Then blot down the filter with a towel.  DO NOT wash your machine's white filter, if one is present--those are disposable and you should replace white filter every two weeks. If you are recovering from being sick, we recommend changing the filter sooner.  If your CPAP has a humidifier, that also needs to be cleaned weekly. Empty any remaining water and then  wash the water chamber in the sink with warm soapy water. Rinse well and drain out as much of the water as possible. Let the chamber air-dry before placing it back into the CPAP unit.  Every other week you should disinfect the humidifier. Do that by soaking it in a solution of one-part vinegar to five parts water for 30 minutes, thoroughly rinsing and then placing in your dishwasher's top rack for washing. And keep it clean by using only distilled water to prevent mineral deposits that can build up and cause damage  to your machine.  IMPORTANT TIPS Make caring for your CPAP equipment part of your morning routine. Keep machine and accessories out of direct sunlight to avoid damaging them. Never use bleach to clean accessories. Place machine on a level surface and away from curtains that may interfere with the air intake.

## 2021-02-24 NOTE — Telephone Encounter (Signed)
pt has had a sinus infection for about month & half, & nothing seems to be working.  pt hasnt received CPAP machine or supply from  [facility]  believe that his sinus infection came from his lack of new supply & the fact hes using a older machine that is pretty much broken. [phone]

## 2021-02-24 NOTE — Telephone Encounter (Signed)
Sent community message to [place] for update on C-Pap status.

## 2021-02-24 NOTE — Telephone Encounter (Signed)
Called and spoke with patient advised with recommendations per Dr. [name].  He states he took a covid test and it was negative.  He only experiences the congestion and sinus pressure between his eyes only when he uses his cpap machine, during the day he states he is fine.  He was not happy that he was not being prescribed any medications at this time.  I reiterated that he is to call the office if he develops a fever, worsening cough and would need an OV if his covid test is negative.  I was going over the CPAP cleaning instructions and was almost at the end when he interrupted me and said that he had been doing everything that I just described for 12 years and that he has not been able to get his supplies from [facility] and had been having to use old supplies.  I advised him that Magda Paganini had spoken with one of our contacts at [place] and that someone from [facility] should be contacting him directly regarding the supplies.  He was also upset about that as they have not been getting him his supplies even though telling him they will send them.  Patient ended the call.  Nothing further needed.

## 2021-03-10 ENCOUNTER — Telehealth: Payer: Self-pay | Admitting: *Deleted

## 2021-03-10 NOTE — Progress Notes (Signed)
Remote pacemaker transmission.   

## 2021-03-10 NOTE — Telephone Encounter (Signed)
Called and spoke with patient, advised that insurance states patient is to be seen 31-90 days after having CPAP machine set up.  Advised that [facility] let me know that his machine was set up 2 weeks ago.  I asked if there was any other reason that the patient needed to be seen and he stated that he is having post nasal drainage that is not being controlled by over the counter medications.  Advised him that we would see him tomorrow and we can schedule him for another f/u later for the CPAP compliance.  Nothing further needed.

## 2021-03-10 NOTE — Progress Notes (Deleted)
@Patient  ID: [name], male    DOB: [birth year], 60 y.o.   MRN: [id number]  No chief complaint on file.   Referring provider: [facility]  HPI: 60 year old male, former smoker quit in August 2004 (15-pack-year history).  History significant for obstructive sleep apnea, allergic rhinitis, hiatal hernia, chronic A. fib, combined heart failure, coronary artery disease, GERD, alcohol abuse, depression.  Patient of Dr. Annamaria Boots, last seen on 10/21/20.  03/11/2021 Patient called our office on 02/24/21 with reports of chronic sinus congestion and pressure.  Covid test was negative. He has not received CPAP machine or supplies by [facility]. He has been using old supplies.   Saline rinses? Flonase/ astelin? Mucinex DM? CT sinuses or referral to ENT? Oral antihistamine    Allergies  Allergen Reactions   Celecoxib Itching   Hydrocodone Itching   Prednisolone Itching   Potassium Chloride Other (See Comments)    Dizziness and nausea     Immunization History  Administered Date(s) Administered   Influenza Split 08/31/2009, 06/02/2011, 05/13/2012, 05/02/2015, 05/31/2017, 05/01/2018, 05/31/2018   Influenza Whole 08/30/2007, 07/18/2009   Influenza, High Dose Seasonal PF 05/31/2016   Influenza, Seasonal, Injecte, Preservative Fre 05/02/2014, 08/05/2015   Influenza,inj,Quad PF,6+ Mos 05/24/2013, 05/18/2019   Influenza-Unspecified 06/09/2011, 09/13/2013, 10/03/2013, 04/01/2015   PFIZER(Purple Top)SARS-COV-2 Vaccination 11/29/2019, 12/20/2019   Pneumococcal Polysaccharide-23 09/09/2012   Pneumococcal-Unspecified 08/31/2009   Td 07/18/2009   Tdap 08/31/2009, 09/27/2012, 05/31/2014, 10/29/2015    Past Medical History:  Diagnosis Date   Atrial fibrillation ([place])    s/p afib ablation  PVI 5/12 JA   CAD -nonobstructive    S/P nstemi-type II cath 05/2008 - nonobs. dzs.   Cataract    left eye 2019   Colon polyps    Complication of anesthesia    last 2 surgeries pt had some  respiratory issues following surgery required ED visit, dx with bronchitis.    DDD (degenerative disc disease)    evaluate by neurosurgery is ongoing   Depression    ptsd   Duodenal adenoma    Erectile dysfunction    GERD (gastroesophageal reflux disease)    Headache(784.0)    Hemorrhoids    HTN (hypertension)    Hyperlipemia    Hyperthyroidism    Graves dz on methimazole and followed by [name]   Myocardial infarction [facility])    2009   Obesity    Pancreatitis    Polysubstance abuse ([place])    cocaine, last used 1999   Presence of permanent cardiac pacemaker    since 2009   PUD (peptic ulcer disease)    gastritis due to ETOH previously   Pulmonary embolism ([place]) 04/2003   Hx of   Rectal bleeding    Sinus node dysfunction ([facility])    s/p MDT PPM - AAI - V port plugged   Sleep apnea    uses CPAP [facility] 2 YRS   Systolic and diastolic CHF, chronic ([facility])    EF 35% by most recent echo 09/19/10   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   recent ly had tooth pulled painful    Tobacco History: Social History   Tobacco Use  Smoking Status Former   Packs/day: 1.00   Years: 15.00   Pack years: 15.00   Types: Cigarettes   Quit date: 04/04/2003   Years since quitting: 17.9  Smokeless Tobacco Never   Counseling given: Not Answered   Outpatient Medications Prior to Visit  Medication Sig Dispense Refill   atorvastatin (LIPITOR) 40 MG tablet  Take 40 mg by mouth daily.     carvedilol (COREG) 25 MG tablet TAKE 1 TABLET (25 MG TOTAL) BY MOUTH 2 (TWO) TIMES DAILY WITH A MEAL. 180 tablet 3   desonide (DESOWEN) 0.05 % cream Apply 1 application topically 2 (two) times daily as needed (for rash).      dexlansoprazole (DEXILANT) 60 MG capsule Take 1 capsule (60 mg total) by mouth daily. 90 capsule 3   dofetilide (TIKOSYN) 500 MCG capsule Take 1 capsule (500 mcg total) by mouth 2 (two) times daily. 180 capsule 3   ELIQUIS 5 MG TABS tablet TAKE 1 TABLET BY MOUTH TWICE A DAY 60 tablet 5   gabapentin  (NEURONTIN) 100 MG capsule Take 1-2 capsules (100-200 mg total) by mouth See admin instructions. Patient takes 1 tablet in the morning and 2 tablets at night 60 capsule 3   methimazole (TAPAZOLE) 10 MG tablet Take 1 tablet (10 mg total) by mouth daily. 30 tablet 1   oxyCODONE 10 MG TABS Take 1 tablet (10 mg total) by mouth every 4 (four) hours as needed for severe pain ((score 7 to 10)). 30 tablet 0   triamcinolone ointment (KENALOG) 0.1 % Apply topically 2 (two) times daily as needed (for rash).  3   No facility-administered medications prior to visit.      Review of Systems  Review of Systems   Physical Exam  There were no vitals taken for this visit. Physical Exam   Lab Results:  CBC    Component Value Date/Time   WBC 5.8 10/01/2020 1506   RBC 5.25 10/01/2020 1506   HGB 14.0 10/01/2020 1506   HGB 14.3 12/26/2019 0950   HCT 44.0 10/01/2020 1506   HCT 44.0 12/26/2019 0950   PLT 231 10/01/2020 1506   PLT 252 12/26/2019 0950   MCV 83.8 10/01/2020 1506   MCV 81 12/26/2019 0950   MCH 26.7 10/01/2020 1506   MCHC 31.8 10/01/2020 1506   RDW 16.0 (H) 10/01/2020 1506   RDW 16.0 (H) 12/26/2019 0950   LYMPHSABS 2.3 10/01/2020 1506   LYMPHSABS 2.1 04/28/2017 1130   MONOABS 0.6 10/01/2020 1506   EOSABS 0.2 10/01/2020 1506   EOSABS 0.1 04/28/2017 1130   BASOSABS 0.0 10/01/2020 1506   BASOSABS 0.0 04/28/2017 1130    BMET    Component Value Date/Time   NA 136 10/08/2020 0516   NA 140 09/19/2020 1144   K 4.3 10/08/2020 0516   CL 102 10/08/2020 0516   CO2 25 10/08/2020 0516   GLUCOSE 142 (H) 10/08/2020 0516   BUN 13 10/08/2020 0516   BUN 16 09/19/2020 1144   CREATININE 1.06 10/08/2020 0516   CREATININE 0.96 05/06/2016 0844   CALCIUM 9.3 10/08/2020 0516   GFRNONAA >60 10/08/2020 0516   GFRAA 83 09/19/2020 1144    BNP    Component Value Date/Time   BNP 280.5 (H) 04/04/2017 0023    ProBNP    Component Value Date/Time   PROBNP <30.0 03/30/2009 0033     Imaging: CUP PACEART REMOTE DEVICE CHECK  Result Date: 02/17/2021 Scheduled remote reviewed. Normal device function. Programmed AAIR 8 AHR events logged 4 to 26 seconds w/ A rates 150's to 160's bpm; markers suggest AT vs ST Next remote 91 days. HB    Assessment & Plan:   No problem-specific Assessment & Plan notes found for this encounter.     Martyn Ehrich, NP 03/10/2021

## 2021-03-11 ENCOUNTER — Ambulatory Visit: Payer: Medicaid Other | Admitting: Primary Care

## 2021-03-11 ENCOUNTER — Other Ambulatory Visit: Payer: Self-pay

## 2021-03-11 ENCOUNTER — Ambulatory Visit: Payer: Medicaid Other | Admitting: Internal Medicine

## 2021-03-11 ENCOUNTER — Encounter: Payer: Self-pay | Admitting: Internal Medicine

## 2021-03-11 DIAGNOSIS — G4733 Obstructive sleep apnea (adult) (pediatric): Secondary | ICD-10-CM | POA: Diagnosis not present

## 2021-03-11 DIAGNOSIS — J0100 Acute maxillary sinusitis, unspecified: Secondary | ICD-10-CM | POA: Diagnosis not present

## 2021-03-11 MED ORDER — AMOXICILLIN-POT CLAVULANATE 875-125 MG PO TABS: 1.0000 | ORAL_TABLET | Freq: Two times a day (BID) | ORAL | 0 refills | Status: DC

## 2021-03-11 NOTE — Progress Notes (Signed)
HPI   male former smoker followed for OSA, complicated by CAD/CHF/P A. fib/pacemaker/Pradaxa, allergic rhinitis, hyperthyroid,  degenerative disc disease, GERD NPSG 2010- AHI 21/ hr  --------------------------------------------------------------------------------------- 10/21/20- 60 year old male former smoker followed for OSA, complicated by CAD/CHF/P A. fib/Pacemaker/Pradaxa, allergic rhinitis, hyperthyroid, degenerative disc disease, GERD, Hx PE 2004 CPAP auto 5-15/[facility]/ [facility] Download- Body weight today- Covid vax-2 Phizer Flu vax-none Hosp- Lumbar spine fusion NSGY. Just getting ambulatory and able to drive after back surgery. Still waiting for replacement machine. Complains of dry mouth despite adjusting humdifier, Having night sweats since spine surgery- getting better, but still disturbs sleep. Doesn't recognize other symptoms of infection. PCP has done labs- pending.   87/47./[age] year old male former smoker followed for OSA, complicated by CAD/CHF/P A. fib/Pacemaker/Eliquis, allergic rhinitis, hyperthyroid, degenerative disc disease, GERD, Hx PE 2004 CPAP auto 5-15/[facility]      Download- Will need CPAP f/u after machine replaced 2 weeks ago Covid vax - 3 Moderna Body weight today-287 lbs Acute rhinitis with nasal congestion, some yellow, no blood or headache. Some cough at night- mostly dry.  ROS-see HPI   + = positive Constitutional:    weight loss, night sweats, fevers, chills, fatigue, lassitude. HEENT:    headaches, difficulty swallowing, tooth/dental problems, sore throat,       sneezing, itching, ear ache, +nasal congestion, post nasal drip, snoring CV:    +chest pain, orthopnea, PND, swelling in lower extremities, anasarca,                                                    dizziness, palpitations Resp:   +shortness of breath with exertion or at rest.                +productive cough,   non-productive cough, coughing up of blood.              change in color  of mucus.  wheezing.   Skin:    rash or lesions. GI:  No-   heartburn, indigestion, abdominal pain, nausea, vomiting GU: . MS:   joint pain, stiffness,  Neuro-     nothing unusual Psych:  change in mood or affect.  depression or anxiety.   memory loss.  OBJ- Physical Exam General- Alert, Oriented, Affect-appropriate, Distress- none acute, + obese, + cane Skin- rash-none, lesions- none, excoriation- none Lymphadenopathy- none Head- atraumatic            Eyes- Gross vision intact, PERRLA, conjunctivae and secretions clear            Ears- Hearing, canals-normal            Nose- , + turbinate edema R, no-Septal dev, mucus, polyps, erosion, perforation             Throat- Mallampati III-IV , mucosa clear/not dry , drainage- none, tonsils- atrophic Neck- flexible , trachea midline, no stridor , thyroid nl, carotid no bruit Chest - symmetrical excursion , unlabored           Heart/CV- RRR , no murmur , no gallop  , no rub, nl s1 s2                           - JVD- none , edema- none, stasis changes- none, varices- none  Lung- clear to P&A, wheeze- none, cough- none , dullness-none, rub- none           Chest wall- L pacemaker Abd-  Br/ Gen/ Rectal- Not done, not indicated Extrem- cyanosis- none, clubbing, none, atrophy- none, strength- nl. +Missing distal fingers R hand. Neuro- grossly intact to observation

## 2021-03-11 NOTE — Patient Instructions (Addendum)
Script sent for augmentin antibiotic   Sugest you take this twice daily with food.  Try an otc nasal saline rinse ( store brand, AYR, NeilMed, etc) once eor twice daily to loosen up the mucus in your head.  We will get you back in the time window required by insurance to check on your new CPAP machine.  Please call if we can help

## 2021-04-18 ENCOUNTER — Telehealth: Payer: Self-pay | Admitting: Pulmonary Disease

## 2021-04-18 ENCOUNTER — Telehealth: Payer: Self-pay | Admitting: Internal Medicine

## 2021-04-18 MED ORDER — AZITHROMYCIN 250 MG PO TABS: ORAL_TABLET | ORAL | 0 refills | Status: DC

## 2021-04-18 NOTE — Telephone Encounter (Signed)
Called and spoke with patient, advised him of the recommendations per Dr. Vaughan Browner.  I verified his pharmacy and zpack sent.  Advised him to call the office back if he is not feelig better after he completes the antibiotic.  He verbalized understanding.  He verified that he has a new CPAP and all the supplies he needs.  Nothing further needed.

## 2021-04-18 NOTE — Telephone Encounter (Signed)
We can offer Z-Pak as he appears to have bronchitis Make sure that the CPAP  is cleaned Reasonable to put an order to DME to get filters changed or CPAP replaced

## 2021-04-18 NOTE — Telephone Encounter (Signed)
Will forward message to PM as CY has left for the day.

## 2021-04-18 NOTE — Telephone Encounter (Signed)
Call made to patient, confirmed DOB. Patient reports for past 3 months he has developed a cough with thick beige mucous. Cough keeps him up at night. Denies SOB. He obtained some mucinex and tried that but it made his BP go up. Most recent antibiotic augmentin given 7/12. He does not feel it has helped at all. Reports he wakes up with a congested headache. He reports he wakes up congested after using the cpap. During the day he is fine. He thinks the congestion is coming for the cpap use and asked if maybe his filters needed changing or if he needed a new machine. I asked if he was cleaning his tubing routinely and patient confirms. Requesting recommendations.   CY please advise. Thanks :)

## 2021-04-18 NOTE — Telephone Encounter (Signed)
Called by Truddie Crumble who states that the Azithromycin ordered by Dr. Vaughan Browner for bronchitis interacts (prolonged QTc interval) with the Tikosyn that he is on for his heart. I offered to call in a prescription for Augmentin that Dr. Annamaria Boots ordered in July of this year. However, he states that the Augmentin did not help him at all. I advised him not to take the Azithromycin and call the PCCM office in the morning for an alternative antibiotic Evidently he has suffered with what he calls bronchitis for several months, therefore, this is a chronic problem.

## 2021-04-18 NOTE — Telephone Encounter (Signed)
Pt stated that they are very congested and he has a lot of mucus that he is coughing up daily; he stated that it is beige in color. Denies wheezing and SOB. Pt believes it is the CPAP that is causing him to have the symptoms he is experiencing.  Pharmacy; [id number] [place] [address] [id number]  Pls regard; [phone]

## 2021-04-21 ENCOUNTER — Telehealth: Payer: Self-pay | Admitting: Internal Medicine

## 2021-04-21 ENCOUNTER — Telehealth: Payer: Self-pay | Admitting: *Deleted

## 2021-04-21 NOTE — Telephone Encounter (Signed)
Attempted phone call to pt x 2.  Message states unable to complete call at this time and to try again later.

## 2021-04-21 NOTE — Telephone Encounter (Signed)
   [facility] Pre-operative Risk Assessment    Patient Name: [name]  DOB: 11-Aug-1961 MRN: [id number]  HEARTCARE STAFF:  - IMPORTANT!!!!!! Under Visit Info/Reason for Call, type in Other and utilize the format Clearance MM/DD/YY or Clearance TBD. Do not use dashes or single digits. - Please review there is not already an duplicate clearance open for this procedure. - If request is for dental extraction, please clarify the # of teeth to be extracted. - If the patient is currently at the dentist's office, call Pre-Op Callback Staff (MA/nurse) to input urgent request.  - If the patient is not currently in the dentist office, please route to the Pre-Op pool.  Request for surgical clearance:  What type of surgery is being performed? LEFT KNEE ARTHROSCOPY  When is this surgery scheduled? 05/21/21  What type of clearance is required (medical clearance vs. Pharmacy clearance to hold med vs. Both)? BOTH  Are there any medications that need to be held prior to surgery and how long? [place] name and name of physician performing surgery? [facility]; DR. Jaynee Eagles  What is the office phone number? NEED TO CONFIRM PHONE #   7.   What is the office fax number? [place]  8.   Anesthesia type (None, local, MAC, general) ? NEED TO CONFIRM ANESTHESIA BEING USED   Julaine Hua 04/21/2021, 5:32 PM  _________________________________________________________________   (provider comments below)

## 2021-04-21 NOTE — Telephone Encounter (Signed)
Spoke with pt who reports he has BP has been elevated for last 4-5 days.  Pt states he saw his PCP on Saturday and started on HCTZ 12.'5mg'$  - 1 tablet by mouth daily.  Pt reports he has not checked his BP yesterday or today.  Pt was advised by his PCP to follow up with cardiology.  Pt is due to be seen this month by APP according to recall.  Appointment scheduled for 04/25/2021 at 920am.  Pt advised to continue taking medication as prescribed and monitoring BP.  Provided education on a low sodium diet, increasing water intake as well as decreasing caffeine and alcohol intake.  Pt verbalizes understanding and agrees with current plan.

## 2021-04-21 NOTE — Telephone Encounter (Signed)
Pt c/o BP issue: STAT if pt c/o blurred vision, one-sided weakness or slurred speech  1. What are your last 5 BP readings? Today's  ago 195/141. Yesterday 161/97  2. Are you having any other symptoms (ex. Dizziness, headache, blurred vision, passed out)? No   3. What is your BP issue? BP running high

## 2021-04-22 ENCOUNTER — Telehealth: Payer: Self-pay | Admitting: Internal Medicine

## 2021-04-22 NOTE — Telephone Encounter (Signed)
Pt has appt 04/25/21 with Oda Kilts, PAC. Will add pre op clearance to appt notes. Will forward clearance notes to [facility] for upcoming appt. Will send FYI to surgeon's office pt has appt 04/25/21.   Addendum: on clearance request; phone # is [phone]

## 2021-04-22 NOTE — Telephone Encounter (Signed)
Spoke with the pt and scheduled appt with CDY for 04/25/21 at 11:30

## 2021-04-22 NOTE — Telephone Encounter (Signed)
Spoke with the pt  He states having cough for going on 3 months now  He is coughing a lot in the am when he wakes up and takes off his CPAP  He feels it's coming from PND  He has had chest tightness for approx a wk now  No increased SOB, wheezing, fevers, aches  He states that we called in zpack 8/19 and they would not fill this due to interaction with other meds  He wants appt asap with CDY and does not want to do anything else over the phone  Can we please use a held spot?

## 2021-04-22 NOTE — Telephone Encounter (Signed)
CLEARANCE REQUEST IS ALSO ASKING TO SEND LABS, [facility] FROM THE PAST YEAR

## 2021-04-22 NOTE — Telephone Encounter (Signed)
Ok held-spot later this week, or he can certainly see an APP

## 2021-04-22 NOTE — Telephone Encounter (Signed)
I tried x 2 to call the requesting office to confirm type of anesthesia being used. I was on hold both times for over 9 minutes each with no one answering the phone line. I will keep trying.

## 2021-04-24 ENCOUNTER — Encounter: Payer: Self-pay | Admitting: Internal Medicine

## 2021-04-24 NOTE — Progress Notes (Signed)
HPI   male former smoker followed for OSA, complicated by CAD/CHF/P A. fib/pacemaker/Pradaxa, allergic rhinitis, hyperthyroid,  degenerative disc disease, GERD NPSG 2010- AHI 21/ hr  ---------------------------------------------------------------------------------------   33/9./7-60 year old male former smoker followed for OSA, complicated by CAD/CHF/P A. fib/Pacemaker/Eliquis, allergic rhinitis, hyperthyroid, degenerative disc disease, GERD, Hx PE 2004 CPAP auto 5-15/[facility]/ [employer]      Download- Will need CPAP f/u after machine replaced 2 weeks ago Covid vax - 3 Moderna Body weight today-287 lbs  04/25/21- 60 year old male former smoker followed for OSA, complicated by CAD/CHF/P A. fib/Pacemaker/Eliquis, allergic rhinitis, hyperthyroid, degenerative disc disease, GERD, Hx PE 2004 CPAP auto 5-15/[facility]/ replaced in July Download- Body weight today- Covid vax-3 Moderna   He called 8/21 c/o cough x 3 months> takes off CPAP. Blames PND. Chest tight x 1 week. We sent ZPAk 8/19> declined by drug store due to drug interactions. > resent by Dr Vaughan Browner 8/19.  Then augmentin on 7/12. -----Follow up OSA.  Complains of cough and sense of drainage in throat. No dificulty swallowing or pain. Sputum green in morning. He didn't try either Zpak or augmentin we sent.in July.No sinus pain. No fever or ankle edema. Blames Mucinex DM for raising BP- may have  been Muccinex-D. Seen today by cardiology with CBC and BMET drawn.  ROS-see HPI   + = positive Constitutional:    weight loss, night sweats, fevers, chills, fatigue, lassitude. HEENT:    headaches, difficulty swallowing, tooth/dental problems, sore throat,       sneezing, itching, ear ache, +nasal congestion, post nasal drip, snoring CV:    +chest pain, orthopnea, PND, swelling in lower extremities, anasarca,                                                    dizziness, palpitations Resp:   +shortness of breath with exertion or at rest.                 +productive cough,   non-productive cough, coughing up of blood.              change in color of mucus.  wheezing.   Skin:    rash or lesions. GI:  No-   heartburn, indigestion, abdominal pain, nausea, vomiting GU: . MS:   joint pain, stiffness,  Neuro-     nothing unusual Psych:  change in mood or affect.  depression or anxiety.   memory loss.  OBJ- Physical Exam General- Alert, Oriented, Affect-appropriate, Distress- none acute, + obese Skin- rash-none, lesions- none, excoriation- none Lymphadenopathy- none Head- atraumatic            Eyes- Gross vision intact, PERRLA, conjunctivae and secretions clear            Ears- Hearing, canals-normal            Nose- , + turbinate edema R, no-Septal dev, mucus, polyps, erosion, perforation             Throat- Mallampati III-IV , mucosa clear/not dry , drainage- none seen, tonsils- atrophic, Not hoarse,  Neck- flexible , trachea midline, no stridor , thyroid nl, carotid no bruit Chest - symmetrical excursion , unlabored           Heart/CV- RRR , no murmur , no gallop  , no rub, nl s1 s2                           -  JVD- none , edema- none, stasis changes- none, varices- none           Lung- clear to P&A, wheeze- none, cough- none , dullness-none, rub- none           Chest wall- L pacemaker Abd-  Br/ Gen/ Rectal- Not done, not indicated Extrem- cyanosis- none, clubbing, none, atrophy- none, strength- nl. +Missing distal fingers R hand. Neuro- grossly intact to observation

## 2021-04-24 NOTE — Progress Notes (Signed)
Electrophysiology Office Note Date: 04/24/2021  ID:  [name], DOB Dec 05, 1960, MRN [id number]  PCP: [facility] [place] Primary Cardiologist: Thompson Grayer, MD Electrophysiologist: Thompson Grayer, MD (AF ablation ) Dr. [name] (PPM)  CC: Pacemaker follow-up  [name] is a 60 y.o. male seen today for Thompson Grayer, MD for cardiac clearance.  Since last being seen in our clinic the patient reports doing very well overall. His BP was running higher at home last week. But had URI and was taking OTC decongestant. Sees other provider this afternoon for same. No fevers chills. he denies chest pain, palpitations, dyspnea, PND, orthopnea, nausea, vomiting, dizziness, syncope, edema, weight gain, or early satiety.  Device History: MDT dual chamber PPM implanted 2009, A lead only, RV port is plugged   Afib Hx Diagnosed 2004 PVI ablation 2012 (Dr. Rayann Heman) 05/06/2017: PVI, CTI ablation (Dr. Rayann Heman)   AAD 2010 started on Tikosyn is current  Past Medical History:  Diagnosis Date   Atrial fibrillation [facility])    s/p afib ablation  PVI 5/12 JA   CAD -nonobstructive    S/P nstemi-type II cath 05/2008 - nonobs. dzs.   Cataract    left eye 2019   Colon polyps    Complication of anesthesia    last 2 surgeries pt had some respiratory issues following surgery required ED visit, dx with bronchitis.    DDD (degenerative disc disease)    evaluate by neurosurgery is ongoing   Depression    ptsd   Duodenal adenoma    Erectile dysfunction    GERD (gastroesophageal reflux disease)    Headache(784.0)    Hemorrhoids    HTN (hypertension)    Hyperlipemia    Hyperthyroidism    Graves dz on methimazole and followed by [name]   Myocardial infarction [facility])    2009   Obesity    Pancreatitis    Polysubstance abuse ([place])    cocaine, last used 1999   Presence of permanent cardiac pacemaker    since 2009   PUD (peptic ulcer disease)    gastritis due to ETOH previously    Pulmonary embolism ([place]) 04/2003   Hx of   Rectal bleeding    Sinus node dysfunction (HCC)    s/p MDT PPM - AAI - V port plugged   Sleep apnea    uses CPAP [facility] 2 YRS   Systolic and diastolic CHF, chronic (HCC)    EF 35% by most recent echo 09/19/10   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   recent ly had tooth pulled painful   Past Surgical History:  Procedure Laterality Date   ANTERIOR CERVICAL DECOMP/DISCECTOMY FUSION  12/15/2011   Procedure: ANTERIOR CERVICAL DECOMPRESSION/DISCECTOMY FUSION 1 LEVEL;  Surgeon: [name], MD;  Location: [facility];  Service: Neurosurgery;  Laterality: N/A;  Cervical Six-Seven Anterior Cervical Diskectomy fusion with Allograft and Plating   ANTERIOR CERVICAL DECOMP/DISCECTOMY FUSION N/A 01/01/2020   Procedure: Anterior Cervical Discectomy Fusion - Cervical Seven- Thoracic One;  Surgeon: Earnie Larsson, MD;  Location: [facility];  Service: Neurosurgery;  Laterality: N/A;  Anterior Cervical Discectomy Fusion - Cervical Seven- Thoracic One   ATRIAL ABLATION SURGERY  12/2010   afib ablation by [name]   ATRIAL FIBRILLATION ABLATION N/A 05/06/2017   Procedure: Atrial Fibrillation Ablation;  Surgeon: Thompson Grayer, MD;  Location: [place] [facility];  Service: Cardiovascular;  Laterality: N/A;   BACK SURGERY     CARDIAC CATHETERIZATION     CADIAC ABLATION  [name] 12/2010   CARDIAC CATHETERIZATION N/A 05/08/2016   Procedure: Left Heart Cath and Coronary Angiography;  Surgeon: Peter M Martinique, MD;  Location: [place] [facility];  Service: Cardiovascular;  Laterality: N/A;   Fingers removed from right hand.  2/84   traumatic work injury   INSERT / REPLACE / REMOVE PACEMAKER     05/2008     LUMBAR SPINE SURGERY  02/2009   Dr. Trenton Gammon   PACEMAKER INSERTION  10/09   by Dr [name]   TEE WITHOUT CARDIOVERSION N/A 05/05/2017   Procedure: TRANSESOPHAGEAL ECHOCARDIOGRAM (TEE);  Surgeon: Larey Dresser, MD;  Location: [facility];  Service: Cardiovascular;  Laterality: N/A;    Current  Outpatient Medications  Medication Sig Dispense Refill   amoxicillin-clavulanate (AUGMENTIN) 875-125 MG tablet Take 1 tablet by mouth 2 (two) times daily. 14 tablet 0   atorvastatin (LIPITOR) 40 MG tablet Take 40 mg by mouth daily.     azithromycin (ZITHROMAX) 250 MG tablet Take 2 tablets today and then 1 tablet daily until gone. 6 tablet 0   carvedilol (COREG) 25 MG tablet TAKE 1 TABLET (25 MG TOTAL) BY MOUTH 2 (TWO) TIMES DAILY WITH A MEAL. 180 tablet 3   desonide (DESOWEN) 0.05 % cream Apply 1 application topically 2 (two) times daily as needed (for rash).      dexlansoprazole (DEXILANT) 60 MG capsule Take 1 capsule (60 mg total) by mouth daily. 90 capsule 3   dofetilide (TIKOSYN) 500 MCG capsule Take 1 capsule (500 mcg total) by mouth 2 (two) times daily. 180 capsule 3   ELIQUIS 5 MG TABS tablet TAKE 1 TABLET BY MOUTH TWICE A DAY 60 tablet 5   gabapentin (NEURONTIN) 100 MG capsule Take 1-2 capsules (100-200 mg total) by mouth See admin instructions. Patient takes 1 tablet in the morning and 2 tablets at night 60 capsule 3   methimazole (TAPAZOLE) 10 MG tablet Take 1 tablet (10 mg total) by mouth daily. 30 tablet 1   oxyCODONE 10 MG TABS Take 1 tablet (10 mg total) by mouth every 4 (four) hours as needed for severe pain ((score 7 to 10)). 30 tablet 0   triamcinolone ointment (KENALOG) 0.1 % Apply topically 2 (two) times daily as needed (for rash).  3   No current facility-administered medications for this visit.    Allergies:   Celecoxib, Hydrocodone, Prednisolone, and Potassium chloride   Social History: Social History   Socioeconomic History   Marital status: Legally Separated    Spouse name: Not on file   Number of children: 7   Years of education: Not on file   Highest education level: Not on file  Occupational History   Occupation: unemployed    Employer: UNEMPLOYED  Tobacco Use   Smoking status: Former    Packs/day: 1.00    Years: 15.00    Pack years: 15.00    Types:  Cigarettes    Quit date: 04/04/2003    Years since quitting: 18.0   Smokeless tobacco: Never  Vaping Use   Vaping Use: Never used  Substance and Sexual Activity   Alcohol use: No    Comment: formerly heavy ETOH,  no alcohol in 20 years   Drug use: No    Comment: previously used crack cocaine, quit 1999,  + marijuana   Sexual activity: Yes    Birth control/protection: Condom  Other Topics Concern   Not on file  Social History Narrative   Unemployed currently.  Previously worked as a  Dealer.  Lives in [place].   Social Determinants of Health   Financial Resource Strain: Not on file  Food Insecurity: Not on file  Transportation Needs: Not on file  Physical Activity: Not on file  Stress: Not on file  Social Connections: Not on file  Intimate Partner Violence: Not on file    Family History: Family History  Problem Relation Age of Onset   Heart disease Mother    Breast cancer Mother    Lung cancer Father    Diabetes Other    Colon cancer Neg Hx    Esophageal cancer Neg Hx    Rectal cancer Neg Hx    Stomach cancer Neg Hx      Review of Systems: All other systems reviewed and are otherwise negative except as noted above.  Physical Exam: There were no vitals filed for this visit.   GEN- The patient is well appearing, alert and oriented x 3 today.   HEENT: normocephalic, atraumatic; sclera clear, conjunctiva pink; hearing intact; oropharynx clear; neck supple  Lungs- Clear to ausculation bilaterally, normal work of breathing.  No wheezes, rales, rhonchi Heart- Regular rate and rhythm, no murmurs, rubs or gallops  GI- soft, non-tender, non-distended, bowel sounds present  Extremities- no clubbing or cyanosis. No edema MS- no significant deformity or atrophy Skin- warm and dry, no rash or lesion; PPM pocket well healed Psych- euthymic mood, full affect Neuro- strength and sensation are intact  PPM Interrogation- reviewed in detail today,  See PACEART report  EKG:   EKG is ordered today. Personal review of ekg ordered today shows NSR at 62 bpm with stable Qtc on tikosyn   Recent Labs: 10/01/2020: Hemoglobin 14.0; Platelets 231 10/08/2020: BUN 13; Creatinine, Ser 1.06; Magnesium 2.0; Potassium 4.3; Sodium 136   Wt Readings from Last 3 Encounters:  03/11/21 287 lb 6.4 oz (130.4 kg)  10/25/20 276 lb (125.2 kg)  10/21/20 273 lb 12.8 oz (124.2 kg)     Other studies Reviewed: Additional studies/ records that were reviewed today include: Previous EP office notes, Previous remote checks, Most recent labwork.   Assessment and Plan:  1. SSS/Tachy-brady s/p Medtronic PPM  Normal PPM function See Pace Art report No changes today  2. Paroxysmal Afib CHA2DS2Vasc is 3, on eliquis, appropriately dosed Well controlled EKG today NSR with stable QTc on tikosyn.       3. HTN Stable on current regimen.    4. NICM LVEF back to normal, 60-65% by echo 10/2020. NYHA II-III symptoms Volume status stable on exam     5. CP No further.      R/o with neg Trop, atypical sounding   6. Cardiac Clearance for left knee arthroscopy No exertional chest pain Echo 10/2020 LVEF normalized He is able to proceed with relatively low risk by Revised Cardiac Risk Index [facility] Criteria)   Current medicines are reviewed at length with the patient today.   The patient does not have concerns regarding his medicines.   Labs/ tests ordered today include:  Orders Placed This Encounter  Procedures   Basic metabolic panel   CBC   Magnesium   CUP PACEART INCLINIC DEVICE CHECK   EKG 12-Lead     Disposition:   Follow up with Dr. [name] in 6 Months. He has seen both JA (AF) and SK (PPM).    Jacalyn Lefevre, PA-C  04/24/2021 2:28 PM  [address] [place] [address] [phone] (office) [phone] (fax)

## 2021-04-25 ENCOUNTER — Ambulatory Visit (INDEPENDENT_AMBULATORY_CARE_PROVIDER_SITE_OTHER): Payer: Medicaid Other | Admitting: Internal Medicine

## 2021-04-25 ENCOUNTER — Encounter: Payer: Self-pay | Admitting: Internal Medicine

## 2021-04-25 ENCOUNTER — Ambulatory Visit (INDEPENDENT_AMBULATORY_CARE_PROVIDER_SITE_OTHER): Payer: Medicaid Other

## 2021-04-25 ENCOUNTER — Telehealth: Payer: Self-pay | Admitting: Internal Medicine

## 2021-04-25 ENCOUNTER — Other Ambulatory Visit: Payer: Self-pay

## 2021-04-25 ENCOUNTER — Ambulatory Visit (INDEPENDENT_AMBULATORY_CARE_PROVIDER_SITE_OTHER): Payer: Medicaid Other | Admitting: Student

## 2021-04-25 VITALS — BP 132/90 | HR 67 | Ht 71.0 in | Wt 274.2 lb

## 2021-04-25 VITALS — BP 130/90 | HR 62 | Ht 71.0 in | Wt 274.0 lb

## 2021-04-25 DIAGNOSIS — I428 Other cardiomyopathies: Secondary | ICD-10-CM

## 2021-04-25 DIAGNOSIS — I4819 Other persistent atrial fibrillation: Secondary | ICD-10-CM

## 2021-04-25 DIAGNOSIS — J0100 Acute maxillary sinusitis, unspecified: Secondary | ICD-10-CM

## 2021-04-25 DIAGNOSIS — I509 Heart failure, unspecified: Secondary | ICD-10-CM

## 2021-04-25 DIAGNOSIS — I495 Sick sinus syndrome: Secondary | ICD-10-CM | POA: Diagnosis not present

## 2021-04-25 DIAGNOSIS — I5022 Chronic systolic (congestive) heart failure: Secondary | ICD-10-CM | POA: Diagnosis not present

## 2021-04-25 DIAGNOSIS — G4733 Obstructive sleep apnea (adult) (pediatric): Secondary | ICD-10-CM | POA: Diagnosis not present

## 2021-04-25 LAB — CBC
Hematocrit: 46.5 % (ref 37.5–51.0)
Hemoglobin: 15.1 g/dL (ref 13.0–17.7)
MCH: 26 pg — ABNORMAL LOW (ref 26.6–33.0)
MCHC: 32.5 g/dL (ref 31.5–35.7)
MCV: 80 fL (ref 79–97)
Platelets: 270 10*3/uL (ref 150–450)
RBC: 5.8 x10E6/uL (ref 4.14–5.80)
RDW: 17 % — ABNORMAL HIGH (ref 11.6–15.4)
WBC: 6.3 10*3/uL (ref 3.4–10.8)

## 2021-04-25 LAB — BASIC METABOLIC PANEL
BUN/Creatinine Ratio: 18 (ref 10–24)
BUN: 20 mg/dL (ref 8–27)
CO2: 23 mmol/L (ref 20–29)
Calcium: 9.5 mg/dL (ref 8.6–10.2)
Chloride: 98 mmol/L (ref 96–106)
Creatinine, Ser: 1.09 mg/dL (ref 0.76–1.27)
Glucose: 88 mg/dL (ref 65–99)
Potassium: 4.4 mmol/L (ref 3.5–5.2)
Sodium: 136 mmol/L (ref 134–144)
eGFR: 77 mL/min/{1.73_m2} (ref >59)

## 2021-04-25 LAB — CUP PACEART INCLINIC DEVICE CHECK
Battery Impedance: 1630 Ohm
Battery Remaining Longevity: 60 mo
Battery Voltage: 2.76 V
Brady Statistic RA Percent Paced: 4 %
Date Time Interrogation Session: 20220826095924
Implantable Lead Implant Date: 20091023
Implantable Lead Location: 753859
Implantable Lead Model: 5076
Implantable Pulse Generator Implant Date: 20091023
Lead Channel Impedance Value: 468 Ohm
Lead Channel Impedance Value: 67 Ohm
Lead Channel Pacing Threshold Amplitude: 1 V
Lead Channel Pacing Threshold Pulse Width: 0.4 ms
Lead Channel Sensing Intrinsic Amplitude: 2 mV
Lead Channel Setting Pacing Amplitude: 2 V

## 2021-04-25 LAB — MAGNESIUM: Magnesium: 2.4 mg/dL — ABNORMAL HIGH (ref 1.6–2.3)

## 2021-04-25 LAB — BRAIN NATRIURETIC PEPTIDE: Pro B Natriuretic peptide (BNP): 11 pg/mL (ref 0.0–100.0)

## 2021-04-25 MED ORDER — BREZTRI AEROSPHERE 160-9-4.8 MCG/ACT IN AERO: 2.0000 | INHALATION_SPRAY | Freq: Two times a day (BID) | RESPIRATORY_TRACT | 0 refills | Status: DC

## 2021-04-25 MED ORDER — DOXYCYCLINE HYCLATE 100 MG PO TABS: 100.0000 mg | ORAL_TABLET | Freq: Two times a day (BID) | ORAL | 0 refills | Status: DC

## 2021-04-25 NOTE — Patient Instructions (Addendum)
Order- CXR    dx CHF, acute bronchitis  Script sent for doxycycline  antibiotic  Suggest plain Mucinex   this shouldn't affect blood pressure, but should help this sticky mucus.  Order lab-  BNP   dx CHF  Order- sample Breztri    inhale 2 puffs, then rinse mouth, twice daily  We can continue CPAP auto 5-15

## 2021-04-25 NOTE — Assessment & Plan Note (Signed)
Siusitis with postnasal drip vs tracheobronchitis. Not very clear symptoms. Plan- doxycycline, CXR, lab for BNP to excluse CHF.

## 2021-04-25 NOTE — Assessment & Plan Note (Signed)
Benefits with good compliance and control Plan- continue auto 5-15 

## 2021-04-25 NOTE — Telephone Encounter (Signed)
Call made to patient, confirmed DOB. Apologized for delay in getting medication to pharmacy. Confirmed doxycycline order with CY. Order sent.   Also made aware of CXR results:    Voiced understanding. Nothing further needed at this time.

## 2021-04-25 NOTE — Patient Instructions (Signed)
Medication Instructions:  Your physician recommends that you continue on your current medications as directed. Please refer to the Current Medication list given to you today.  *If you need a refill on your cardiac medications before your next appointment, please call your pharmacy*   Lab Work: TODAY: BMET, CBC, Mg  If you have labs (blood work) drawn today and your tests are completely normal, you will receive your results only by: [place] (if you have MyChart) OR A paper copy in the mail If you have any lab test that is abnormal or we need to change your treatment, we will call you to review the results.    Follow-Up: At [facility], you and your health needs are our priority.  As part of our continuing mission to provide you with exceptional heart care, we have created designated Provider Care Teams.  These Care Teams include your primary Cardiologist (physician) and Advanced Practice Providers (APPs -  Physician Assistants and Nurse Practitioners) who all work together to provide you with the care you need, when you need it.   Your next appointment:   6 month(s)  The format for your next appointment:   In Person  Provider:   You may see Thompson Grayer, MD or one of the following Advanced Practice Providers on your designated Care Team:   Tommye Standard, [place] Legrand Como "[facility]" [place], [place]

## 2021-04-29 ENCOUNTER — Encounter: Payer: Self-pay | Admitting: *Deleted

## 2021-05-14 ENCOUNTER — Telehealth: Payer: Self-pay | Admitting: Internal Medicine

## 2021-05-14 DIAGNOSIS — G4733 Obstructive sleep apnea (adult) (pediatric): Secondary | ICD-10-CM

## 2021-05-14 NOTE — Telephone Encounter (Signed)
Called and spoke with pt who states he is still having problems with nasal drainage. States that he has been using flonase nasal spray, taking mucinex, and even abx when they had been prescribed but pt said that nothing is helping.  Pt said that he does have an appt scheduled with ENT 9/26 but that was the soonest appt that was avail.  States overnight while wearing the cpap, he will wake up and have to take it off and wonders if the cpap might be a problem due to his symptoms being worse in the morning when he wakes up. Pt said that the congestion is better except in the mornings as he does cough up a lot of phlegm first thing in the morning. Pt said over the last 6 months, cpap has been his problem.  States that he started taking benadryl which has helped some but said that all of his nasal drainage has been going on for 4 months.  Pt wants to know if there is another option for him than the cpap to help with the OSA and also if there are any other recommendations to help with his symptoms until he is abe to see ENT 9/26.  Dr. Annamaria Boots, please advise.  Allergies  Allergen Reactions   Celecoxib Itching   Hydrocodone Itching   Prednisolone Itching   Potassium Chloride Other (See Comments)    Dizziness and nausea      Current Outpatient Medications:    amoxicillin-clavulanate (AUGMENTIN) 875-125 MG tablet, Take 1 tablet by mouth 2 (two) times daily. (Patient not taking: Reported on 04/25/2021), Disp: 14 tablet, Rfl: 0   atorvastatin (LIPITOR) 40 MG tablet, Take 40 mg by mouth daily., Disp: , Rfl:    Budeson-Glycopyrrol-Formoterol (BREZTRI AEROSPHERE) 160-9-4.8 MCG/ACT AERO, Inhale 2 puffs into the lungs in the morning and at bedtime., Disp: 10.7 g, Rfl: 0   carvedilol (COREG) 25 MG tablet, TAKE 1 TABLET (25 MG TOTAL) BY MOUTH 2 (TWO) TIMES DAILY WITH A MEAL., Disp: 180 tablet, Rfl: 3   desonide (DESOWEN) 0.05 % cream, Apply 1 application topically 2 (two) times daily as needed (for rash). ,  Disp: , Rfl:    dexlansoprazole (DEXILANT) 60 MG capsule, Take 1 capsule (60 mg total) by mouth daily., Disp: 90 capsule, Rfl: 3   dofetilide (TIKOSYN) 500 MCG capsule, Take 1 capsule (500 mcg total) by mouth 2 (two) times daily., Disp: 180 capsule, Rfl: 3   doxycycline (VIBRA-TABS) 100 MG tablet, Take 1 tablet (100 mg total) by mouth 2 (two) times daily., Disp: 14 tablet, Rfl: 0   ELIQUIS 5 MG TABS tablet, TAKE 1 TABLET BY MOUTH TWICE A DAY, Disp: 60 tablet, Rfl: 5   gabapentin (NEURONTIN) 100 MG capsule, Take 1-2 capsules (100-200 mg total) by mouth See admin instructions. Patient takes 1 tablet in the morning and 2 tablets at night, Disp: 60 capsule, Rfl: 3   methimazole (TAPAZOLE) 10 MG tablet, Take 1 tablet (10 mg total) by mouth daily., Disp: 30 tablet, Rfl: 1   oxyCODONE 10 MG TABS, Take 1 tablet (10 mg total) by mouth every 4 (four) hours as needed for severe pain ((score 7 to 10))., Disp: 30 tablet, Rfl: 0   triamcinolone ointment (KENALOG) 0.1 %, Apply topically 2 (two) times daily as needed (for rash)., Disp: , Rfl: 3

## 2021-05-14 NOTE — Telephone Encounter (Signed)
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 He thinks that the CPAP is causing his cough and nasal symptoms in the mornings. He had asked in the earlier msg when he spoke with Raquel Sarna if there was another option to tx his OSA rather than the CPAP.

## 2021-05-14 NOTE — Telephone Encounter (Signed)
Pt is still having post nasal drip. Is using flonase and the antibiotics and mucinex, but nothing is helping. States his congestion is bette except in the morning when he gouhs up lots of pheglm. Believes that the CPAP is his problem as its only a problem in the morning. Has appt on 9/26 with an ENT

## 2021-05-14 NOTE — Telephone Encounter (Signed)
Ok to Rx Atrovent (ipratropium) 0.06% nasal spray    1-2 puffs each nostril twice daily as needed, ref x 1  By all means, keep upcoming appointment with ENT.

## 2021-05-14 NOTE — Telephone Encounter (Signed)
CPAP should help his heart condition. Can he explain what it is about CPAP that is his problem?  Too much pressure, mask uncomfortable? Ok to try off CPAP for a week to compare, but should start it up again then to see if it feels better.

## 2021-05-14 NOTE — Telephone Encounter (Signed)
Attempted to call pt but line just rang and rang and no machine ever kicked in to be able to leave a VM. Will try to call back later.

## 2021-05-14 NOTE — Telephone Encounter (Signed)
I spoke with the pt and gave response per Dr Annamaria Boots. He states he does not want to try any new meds at this time and states "I want to know what can be done about the CPAP bc that is my problem". Dr Annamaria Boots, I am not sure what else to advise him. Please advise what to do, thanks

## 2021-05-14 NOTE — Telephone Encounter (Signed)
If Mr Ainsley has his own teeth, we can refer him to Dr Oneal Grout, Orthodontist, to consider an oral appliance for management of OSA. If he doesn't have sound teeth that device won't work.  Which ENT is he going to see??   He can talk with that doctor about ENT surgery for OSA.  If he is not going to either Dr Wilburn Cornelia or Dr Redmond Baseman, they're the ones to talk to about the Inspire nerve stimulator for OSA. We can refer there if he wants, after he sees whoever he is already scheduled with.

## 2021-05-16 NOTE — Telephone Encounter (Signed)
I called and spoke with patient regarding Dr. Thomes Cake.he verbalized understanding and would like to try the oral appliance, he does have his own teeth so I have sent in the order. He will also discuss the inspire device with the ENT on 05/26/21, he is not sure who he is seeing. Patient informed to call with any other questions. Nothing further needed.

## 2021-05-19 ENCOUNTER — Ambulatory Visit (INDEPENDENT_AMBULATORY_CARE_PROVIDER_SITE_OTHER): Payer: Medicaid Other

## 2021-05-19 DIAGNOSIS — I428 Other cardiomyopathies: Secondary | ICD-10-CM | POA: Diagnosis not present

## 2021-05-20 NOTE — Progress Notes (Signed)
HPI   male former smoker followed for OSA, complicated by CAD/CHF/P A. fib/pacemaker/Pradaxa, allergic rhinitis, hyperthyroid,  degenerative disc disease, GERD NPSG 2010- AHI 21/ hr  ---------------------------------------------------------------------------------------   04/25/21- 60 year old male former smoker followed for OSA, complicated by CAD/CHF/P A. fib/Pacemaker/Eliquis, allergic rhinitis, hyperthyroid, degenerative disc disease, GERD, Hx PE 2004 CPAP auto 5-15/Aerocare/ replaced in July Download- Body weight today- Covid vax-3 Moderna   He called 8/21 c/o cough x 3 months> takes off CPAP. Blames PND. Chest tight x 1 week. We sent ZPAk 8/19> declined by drug store due to drug interactions. > resent by Dr Vaughan Browner 8/19.  Then augmentin on 7/12. -----Follow up OSA.  Complains of cough and sense of drainage in throat. No dificulty swallowing or pain. Sputum green in morning. He didn't try either Zpak or augmentin we sent.in July.No sinus pain. No fever or ankle edema. Blames Mucinex DM for raising BP- may have  been Muccinex-D. Seen today by cardiology with CBC and BMET drawn.  05/21/21- 60 year old male former smoker followed for OSA, complicated by CAD/CHF/P A. fib/Pacemaker/Eliquis, allergic rhinitis, hyperthyroid, degenerative disc disease, GERD, Hx PE 2004 CPAP auto 5-15/Aerocare/ replaced in July Download-compliance 98%, AHI 1.8/ hr. Body weight today-285 lbs Covid vax-3 Moderna He describes persistent sinus pressure and post-nasal drip. He is concerned that CPAP is causing this. Has ENT appt next week with Dr Benjamine Mola. Meanwhile has continued CPAP with good compliance and control. Download reviewed. We had dicussed alternatives to CPAP. He has an appt with Dr Ron Parker Orthodontist to discuss oral appliances in early October.  He is too heavy currently for Inspire and has a cardiac pacemaker. We can send him to look into this later if he chooses.  CXR- 04/25/21- IMPRESSION: Unchanged  appearance of the chest x-ray, with no definite evidence of acute cardiopulmonary disease.   ROS-see HPI   + = positive Constitutional:    weight loss, night sweats, fevers, chills, fatigue, lassitude. HEENT:    headaches, difficulty swallowing, tooth/dental problems, sore throat,       sneezing, itching, ear ache, +nasal congestion, +post nasal drip, snoring CV:    +chest pain, orthopnea, PND, swelling in lower extremities, anasarca,                                                    dizziness, palpitations Resp:   +shortness of breath with exertion or at rest.                productive cough,   non-productive cough, coughing up of blood.              change in color of mucus.  wheezing.   Skin:    rash or lesions. GI:  No-   heartburn, indigestion, abdominal pain, nausea, vomiting GU: . MS:   joint pain, stiffness,  Neuro-     nothing unusual Psych:  change in mood or affect.  depression or anxiety.   memory loss.  OBJ- Physical Exam General- Alert, Oriented, Affect-appropriate, Distress- none acute, + obese Skin- rash-none, lesions- none, excoriation- none Lymphadenopathy- none Head- atraumatic            Eyes- Gross vision intact, PERRLA, conjunctivae and secretions clear            Ears- Hearing, canals-normal  Nose- , + turbinate edema R, no-Septal dev, mucus, polyps, erosion, perforation             Throat- Mallampati III-IV , mucosa clear/not dry , drainage- none seen, tonsils- atrophic, Not hoarse,  Neck- flexible , trachea midline, no stridor , thyroid nl, carotid no bruit Chest - symmetrical excursion , unlabored           Heart/CV- RRR , no murmur , no gallop  , no rub, nl s1 s2                           - JVD- none , edema- none, stasis changes- none, varices- none           Lung- clear to P&A, wheeze- none, cough- none , dullness-none, rub- none           Chest wall- L pacemaker Abd-  Br/ Gen/ Rectal- Not done, not indicated Extrem- cyanosis- none,  clubbing, none, atrophy- none, strength- nl. +Missing distal fingers R hand. Neuro- grossly intact to observation

## 2021-05-21 ENCOUNTER — Ambulatory Visit (INDEPENDENT_AMBULATORY_CARE_PROVIDER_SITE_OTHER): Payer: Medicaid Other | Admitting: Internal Medicine

## 2021-05-21 ENCOUNTER — Encounter: Payer: Self-pay | Admitting: Internal Medicine

## 2021-05-21 ENCOUNTER — Other Ambulatory Visit: Payer: Self-pay

## 2021-05-21 DIAGNOSIS — G4733 Obstructive sleep apnea (adult) (pediatric): Secondary | ICD-10-CM | POA: Diagnosis not present

## 2021-05-21 DIAGNOSIS — J0100 Acute maxillary sinusitis, unspecified: Secondary | ICD-10-CM

## 2021-05-21 NOTE — Assessment & Plan Note (Signed)
Symptoms c/w chronic sinusitis, hx allergic rhinitis. Didn't clear with Rx that cleared bronchitis. Plan- He will see Dr Teoh/ ENT as planned

## 2021-05-21 NOTE — Assessment & Plan Note (Signed)
Benefits from CPAP with good compliance and control, but is concerned it may aggravate sinus symptoms. Given his cardiac disease, CPAP would be best, but if oral appliance can work he may decide to change. Plan- continue CPAP auto 5-15, but keep appointments with his ENT and with Dr Ron Parker.

## 2021-05-21 NOTE — Patient Instructions (Signed)
See what Dr Benjamine Mola can do to help chronic sinus disease. This may make you more comfortable with CPAP.   See what Dr Ron Parker can tell you about using a fitted oral appliance instead of CPAP  We can consider referring to learn about the Inspire nerve stimulator device later, if you want.

## 2021-05-24 LAB — CUP PACEART REMOTE DEVICE CHECK
Battery Impedance: 1579 Ohm
Battery Remaining Longevity: 62 mo
Battery Voltage: 2.76 V
Brady Statistic RA Percent Paced: 2 %
Date Time Interrogation Session: 20220923164858
Implantable Lead Implant Date: 20091023
Implantable Lead Location: 753859
Implantable Lead Model: 5076
Implantable Pulse Generator Implant Date: 20091023
Lead Channel Impedance Value: 448 Ohm
Lead Channel Impedance Value: 67 Ohm
Lead Channel Setting Pacing Amplitude: 2 V

## 2021-05-26 NOTE — Progress Notes (Signed)
Remote pacemaker transmission.   

## 2021-05-29 DIAGNOSIS — M5384 Other specified dorsopathies, thoracic region: Secondary | ICD-10-CM | POA: Diagnosis not present

## 2021-05-29 DIAGNOSIS — M6281 Muscle weakness (generalized): Secondary | ICD-10-CM | POA: Diagnosis not present

## 2021-05-29 DIAGNOSIS — R293 Abnormal posture: Secondary | ICD-10-CM | POA: Diagnosis not present

## 2021-05-29 DIAGNOSIS — M25512 Pain in left shoulder: Secondary | ICD-10-CM | POA: Diagnosis not present

## 2021-06-03 HISTORY — PX: KNEE ARTHROSCOPY: SUR90

## 2021-06-10 ENCOUNTER — Telehealth: Payer: Self-pay | Admitting: *Deleted

## 2021-06-10 NOTE — Telephone Encounter (Signed)
Our office has received 2 faxes today from Dr. Javier Glazier Moore's office. One fax was for device clearance for upcoming surgery for the pt with Dr. Laurance Flatten. Just received the 2nd fax and it states it is AUTHORIZATION for SURGERY. The original clearance was received to our office on 04/21/21, see notes in epic for clearance request. Pt had ov 8/26 and was cleared. Need to clarify this fax asking for authorization for surgery. The fax only states left knee arthroscopy, no other surgery information is on the fax, ie anesthesia etc. Not sure if this is a request asking for the labs, cxr and ekg from the last yr to be faxed to their office.   I clarified earlier on the first fax that it was indeed just for the device clearance, answer to me was yes.

## 2021-06-10 NOTE — Telephone Encounter (Signed)
   Name: TRISTIN VANDEUSEN  DOB: Apr 02, 1961  MRN: [id number]   Primary Cardiologist: Thompson Grayer, MD  Chart reviewed as part of pre-operative protocol coverage. Patient was contacted 06/10/2021 in reference to pre-operative risk assessment for pending surgery as outlined below.  VALON GLASSCOCK was last seen on 04/25/2021 by Haynes Dage PA-C.  Since that day, KULE GASCOIGNE has done well without chest pain or worsening dyspnea.  Therefore, based on ACC/AHA guidelines, the patient would be at acceptable risk for the planned procedure without further cardiovascular testing.   Clinical pharmacist to review Eliquis  The patient was advised that if he develops new symptoms prior to surgery to contact our office to arrange for a follow-up visit, and he verbalized understanding.  I will route this recommendation to the requesting party via Epic fax function and remove from pre-op pool. Please call with questions.  [place], [place] 06/10/2021, 6:38 PM

## 2021-06-10 NOTE — Telephone Encounter (Signed)
[name] from [facility]. Moore's office called back and confirmed the 2nd fax today was asking for pre op clearance. Originally the pt was supposed to have the left knee arthroscopy procedure 05/21/21, though this was not done. Surgery has been rescheduled for 06/23/21. Dr, Laurance Flatten wanted to make sure the clearance that was given 04/25/21 by Joesph July, PAC was still good. I assured [name] that I will d/w pre op provider and will fax clearance.   I did ask [name] what type of anesthesia was being used as we did not have this on the original request 04/21/21. Anesthesia will be GENERAL.   PRE OP PROVIDER: Please see original clearance 04/21/21 and ov note from Joesph July, [facility] 04/25/21. Please advise if the pt is still cleared for the exact surgery as outlined 04/21/21 clearance request.

## 2021-06-11 ENCOUNTER — Other Ambulatory Visit: Payer: Self-pay | Admitting: Physician Assistant

## 2021-06-11 MED ORDER — DOFETILIDE 500 MCG PO CAPS: 500.0000 ug | ORAL_CAPSULE | Freq: Two times a day (BID) | ORAL | 3 refills | Status: DC

## 2021-06-11 NOTE — Telephone Encounter (Signed)
Patient confirmed that that he is aware of the interaction between HCTZ and the Tikosyn, he has already stopped taking the HCTZ.

## 2021-06-11 NOTE — Telephone Encounter (Addendum)
Patient with diagnosis of afib on Eliquis for anticoagulation.    Procedure: left knee arthroscopy Date of procedure: 06/23/21  CHA2DS2-VASc Score = 3  This indicates a 3.2% annual risk of stroke. The patient's score is based upon: CHF History: 1 HTN History: 1 Diabetes History: 0 Stroke History: 0 Vascular Disease History: 1 Age Score: 0 Gender Score: 0   CrCl 44mL/min using adjusted body weight Platelet count 270K  Per office protocol, patient can hold Eliquis for 2-3 days prior to procedure.    Please also clarify if pt is taking HCTZ. Phone note 8/22 states that pt's PCP started him on it, med wasn't added to our med list but dispense report does show 90 day supply was filled in August. HCTZ is contraindicated with Tikosyn and needs to be replaced with alternative BP med that isn't a thiazide.

## 2021-07-20 ENCOUNTER — Encounter: Payer: Self-pay | Admitting: Internal Medicine

## 2021-07-20 NOTE — Assessment & Plan Note (Signed)
Seems to benefit from replacement machine. Plan- return for documentation f/u 31-90 days.

## 2021-07-20 NOTE — Assessment & Plan Note (Signed)
Exacerbation or recurrence, likely to become maxillary sinusitis. Plan- augmentin, nasal saline rinse

## 2021-08-10 ENCOUNTER — Other Ambulatory Visit: Payer: Self-pay | Admitting: Internal Medicine

## 2021-08-11 NOTE — Telephone Encounter (Signed)
Prescription refill request for Eliquis received. Indication: afib  Last office visit: 04/25/2021 Scr: 1.09. 04/25/2021 Age: 60 yo  Weight: 129.3 kg  Refill sent.

## 2021-08-27 ENCOUNTER — Other Ambulatory Visit: Payer: Self-pay | Admitting: Gastroenterology

## 2021-09-02 ENCOUNTER — Ambulatory Visit (INDEPENDENT_AMBULATORY_CARE_PROVIDER_SITE_OTHER): Payer: Medicaid Other

## 2021-09-02 DIAGNOSIS — I428 Other cardiomyopathies: Secondary | ICD-10-CM

## 2021-09-02 LAB — CUP PACEART REMOTE DEVICE CHECK
Battery Impedance: 1687 Ohm
Battery Remaining Longevity: 59 mo
Battery Voltage: 2.76 V
Brady Statistic RA Percent Paced: 4 %
Date Time Interrogation Session: 20221231213521
Implantable Lead Implant Date: 20091023
Implantable Lead Location: 753859
Implantable Lead Model: 5076
Implantable Pulse Generator Implant Date: 20091023
Lead Channel Impedance Value: 438 Ohm
Lead Channel Impedance Value: 67 Ohm
Lead Channel Setting Pacing Amplitude: 2 V

## 2021-09-12 NOTE — Progress Notes (Signed)
Remote pacemaker transmission.   

## 2021-09-23 ENCOUNTER — Other Ambulatory Visit: Payer: Self-pay | Admitting: Neurosurgery

## 2021-09-23 DIAGNOSIS — M4802 Spinal stenosis, cervical region: Secondary | ICD-10-CM

## 2021-10-07 ENCOUNTER — Ambulatory Visit: Payer: Medicaid Other

## 2021-10-16 ENCOUNTER — Other Ambulatory Visit: Payer: Self-pay

## 2021-10-16 ENCOUNTER — Ambulatory Visit
Admission: RE | Admit: 2021-10-16 | Discharge: 2021-10-16 | Disposition: A | Discharge status: Home / Self Care | Payer: Medicaid Other | Source: Ambulatory Visit | Attending: Neurosurgery | Admitting: Neurosurgery

## 2021-10-16 DIAGNOSIS — M4802 Spinal stenosis, cervical region: Secondary | ICD-10-CM

## 2021-10-17 ENCOUNTER — Ambulatory Visit: Payer: Medicaid Other | Attending: Neurosurgery

## 2021-10-17 DIAGNOSIS — M48061 Spinal stenosis, lumbar region without neurogenic claudication: Secondary | ICD-10-CM | POA: Insufficient documentation

## 2021-10-17 DIAGNOSIS — M6281 Muscle weakness (generalized): Secondary | ICD-10-CM | POA: Diagnosis not present

## 2021-10-17 NOTE — Patient Instructions (Signed)
Aquatic Therapy at [facility]-  What to Expect!  Where:   [facility] [address] [place], [place] [address] Rehab phone [phone]  NOTE:  You will receive an automated phone message reminding you of your appt and it will say the appointment is at the [address] [facility] [address] [facility] clinic.          How to Prepare: Please make sure you drink 8 ounces of water about one hour prior to your pool session A caregiver may attend if needed with the patient to help assist as needed. A caregiver can sit in the pool room on chair. Please arrive IN YOUR SUIT and 15 minutes prior to your appointment - this helps to avoid delays in starting your session. Please make sure to attend to any toileting needs prior to entering the pool Locker rooms for changing are provided.   There is direct access to the pool deck form the locker room.  You can lock your belongings in a locker with lock provided. Once on the pool deck your therapist will ask if you have signed the Patient  Consent and Assignment of Benefits form before beginning treatment Your therapist may take your blood pressure prior to, during and after your session if indicated We usually try and create a home exercise program based on activities we do in the pool.  Please be thinking about who might be able to assist you in the pool should you need to participate in an aquatic home exercise program at the time of discharge if you need assistance.  Some patients do not want to or do not have the ability to participate in an aquatic home program - this is not a barrier in any way to you participating in aquatic therapy as part of your current therapy plan! After Discharge from PT, you can continue using home program at  the [facility]/, there is a drop-in fee for $5 ($45 a month)or for 60 years  or older $4.00 ($40 a month for seniors ) or any local [employer] pool.  Memberships for purchase are  available for gym/pool at [facility]  IT IS VERY IMPORTANT THAT YOUR LAST VISIT BE IN THE CLINIC AT [address] AFTER YOUR LAST AQUATIC VISIT.  PLEASE MAKE SURE THAT YOU HAVE A [address]  APPOINTMENT SCHEDULED.   About the pool: Pool is located approximately 500 FT from the entrance of the building.  Please bring a support person if you need assistance traveling this      distance.   Your therapist will assist you in entering the water; there are two ways to           enter: stairs with railings, and a mechanical lift. Your therapist will determine the most appropriate way for you.  Water temperature is usually between 88-90 degrees  There may be up to 2 other swimmers in the pool at the same time  The pool deck is tile, please wear shoes with good traction if you prefer not to be barefoot.    Contact Info:  For appointment scheduling and cancellations:         Please call the [facility]  PH:[phone]              Aquatic Therapy  [facility]patient [facility]       All sessions are 45 minutes                                                    

## 2021-10-17 NOTE — Therapy (Signed)
OUTPATIENT PHYSICAL THERAPY THORACOLUMBAR EVALUATION   Patient Name: [name] MRN: [id number] DOB:[birth year], 61 y.o., male Today's Date: 10/17/2021   PT End of Session - 10/17/21 0906     Visit Number 1    Number of Visits 8    Date for PT Re-Evaluation 11/14/21    Authorization Type MCD    Progress Note Due on Visit 8    PT Start Time 0915    PT Stop Time 1000    PT Time Calculation (min) 45 min    Activity Tolerance Patient tolerated treatment well    Behavior During Therapy [facility] for tasks assessed/performed             Past Medical History:  Diagnosis Date   Atrial fibrillation [facility])    s/p afib ablation  PVI 5/12 JA   CAD -nonobstructive    S/P nstemi-type II cath 05/2008 - nonobs. dzs.   Cataract    left eye 2019   Colon polyps    Complication of anesthesia    last 2 surgeries pt had some respiratory issues following surgery required ED visit, dx with bronchitis.    DDD (degenerative disc disease)    evaluate by neurosurgery is ongoing   Depression    ptsd   Duodenal adenoma    Erectile dysfunction    GERD (gastroesophageal reflux disease)    Headache(784.0)    Hemorrhoids    HTN (hypertension)    Hyperlipemia    Hyperthyroidism    Graves dz on methimazole and followed by [name]   Myocardial infarction [facility])    2009   Obesity    Pancreatitis    Polysubstance abuse ([place])    cocaine, last used 1999   Presence of permanent cardiac pacemaker    since 2009   PUD (peptic ulcer disease)    gastritis due to ETOH previously   Pulmonary embolism ([place]) 04/2003   Hx of   Rectal bleeding    Sinus node dysfunction (HCC)    s/p MDT PPM - AAI - V port plugged   Sleep apnea    uses CPAP [facility] 2 YRS   Systolic and diastolic CHF, chronic (HCC)    EF 35% by most recent echo 09/19/10   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   recent ly had tooth pulled painful   Past Surgical History:  Procedure Laterality Date   ANTERIOR CERVICAL DECOMP/DISCECTOMY FUSION   12/15/2011   Procedure: ANTERIOR CERVICAL DECOMPRESSION/DISCECTOMY FUSION 1 LEVEL;  Surgeon: [name], MD;  Location: [facility];  Service: Neurosurgery;  Laterality: N/A;  Cervical Six-Seven Anterior Cervical Diskectomy fusion with Allograft and Plating   ANTERIOR CERVICAL DECOMP/DISCECTOMY FUSION N/A 01/01/2020   Procedure: Anterior Cervical Discectomy Fusion - Cervical Seven- Thoracic One;  Surgeon: Earnie Larsson, MD;  Location: [place];  Service: Neurosurgery;  Laterality: N/A;  Anterior Cervical Discectomy Fusion - Cervical Seven- Thoracic One   ATRIAL ABLATION SURGERY  12/2010   afib ablation by [name]   ATRIAL FIBRILLATION ABLATION N/A 05/06/2017   Procedure: Atrial Fibrillation Ablation;  Surgeon: Thompson Grayer, MD;  Location: [place] [facility];  Service: Cardiovascular;  Laterality: N/A;   BACK SURGERY     CARDIAC CATHETERIZATION     CADIAC ABLATION [name] 12/2010   CARDIAC CATHETERIZATION N/A 05/08/2016   Procedure: Left Heart Cath and Coronary Angiography;  Surgeon: Peter M Martinique, MD;  Location: [place] [facility];  Service: Cardiovascular;  Laterality: N/A;   Fingers removed from  right hand.  2/84   traumatic work injury   INSERT / REPLACE / REMOVE PACEMAKER     05/2008     LUMBAR SPINE SURGERY  02/2009   Dr. Trenton Gammon   PACEMAKER INSERTION  10/09   by Dr [name]   TEE WITHOUT CARDIOVERSION N/A 05/05/2017   Procedure: TRANSESOPHAGEAL ECHOCARDIOGRAM (TEE);  Surgeon: Larey Dresser, MD;  Location: [facility];  Service: Cardiovascular;  Laterality: N/A;   Patient Active Problem List   Diagnosis Date Noted   Degenerative spondylolisthesis 10/07/2020   Cervical spondylosis with myelopathy and radiculopathy 01/01/2020   Acute maxillary sinusitis 09/12/2018   Paroxysmal atrial fibrillation ([place]) 05/06/2017   Acute on chronic diastolic congestive heart failure, NYHA class 4 ([place]) 04/03/2017   Back pain 11/12/2015   Scrotal varicose veins 07/11/2015   Varicocele 07/11/2015   High  risk medication use - Tikosyn 09/16/2014   Chronic anticoagulation-Pradaxa 09/13/2014   Muscle twitch 09/11/2014   Leg cramps 09/11/2014   Irritant contact dermatitis 10/03/2013   Eunuchoidism 10/03/2013   Hypogonadism male 10/03/2013   Mixed, or nondependent drug abuse, in remission ([place]) 09/13/2013   Substance abuse in remission ([place]) 09/13/2013   Spinal stenosis, lumbar region, with neurogenic claudication 05/23/2013   CAD -nonobstructive    Preoperative cardiovascular examination 11/17/2012   Pulmonary embolism ([place]) 09/02/2012   Acid reflux 09/02/2012   Sinus node dysfunction ([place]) 09/02/2012   Adiposity 02/12/2012   Obesity 02/12/2012   Cervical disc herniation 12/15/2011   Allergic rhinitis 09/21/2011   Atrial fibrillation with RVR ([place]) 07/08/2011   Discomfort 06/19/2011   Male erectile dysfunction 78/29/5621   Lichen planus 30/86/5784   Decreased libido 06/19/2011   Testicular discomfort 06/19/2011   Essential hypertension 04/14/2011   Dermatitis, eczematoid 02/26/2011   Eczema 02/26/2011   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   Degeneration of intervertebral disc of lumbosacral region 01/06/2011   Chronic atrial fibrillation ([place]) 01/06/2011   DDD (degenerative disc disease), lumbosacral 01/06/2011   Graves disease 10/30/2010   CARPAL TUNNEL SYNDROME, LEFT 10/16/2010   SICK SINUS/ TACHY-BRADY SYNDROME 10/16/2010   Ischemic cardiomyopathy 09/21/2010   HLD (hyperlipidemia) 09/16/2010   PPM-Medtr[employer]- 2009 06/27/2010   DYSPNEA 06/06/2010   Abnormal cardiac CT angiography 06/06/2010   Seasonal and perennial allergic rhinitis 02/05/2010   OTHER CHEST PAIN 11/27/2009   ERECTILE DYSFUNCTION, ORGANIC 07/18/2009   OSA (obstructive sleep apnea) 07/08/2009   COMBINED HEART FAILURE, CHRONIC 05/30/2009   LUMBAR RADICULOPATHY, RIGHT 11/05/2008   HIATAL HERNIA 07/04/2008   HYPERTHYROIDISM 08/30/2007   Dyslipidemia 08/30/2007   ALCOHOL ABUSE 08/30/2007   DEPRESSION 08/30/2007   PAF  (paroxysmal atrial fibrillation) ([place]) 08/30/2007   GERD (gastroesophageal reflux disease) 08/30/2007   PULMONARY EMBOLISM, HX OF 08/30/2007   DRUG ABUSE, HX OF 08/30/2007    PCP: [facility]  REFERRING PROVIDER: Earnie Larsson, MD  REFERRING DIAG: Spinal stenosis lumbar  THERAPY DIAG:  Muscle weakness (generalized)  Lumbar stenosis without neurogenic claudication  ONSET DATE: 09/18/2021 (referral date)  SUBJECTIVE:  SUBJECTIVE STATEMENT: Describes history of low back pain since 2010, has had 2 lumbar and 2 cervical surgeries to date w/o relief of symptoms.  Cites LLE pain down posterior aspect ongoing over a year.  Has had L knee arthroscopy 10/22 w/o relief of symptoms.  PERTINENT HISTORY:  Uncomplicated opioid dependence Chronic pain disorder Essential (primary) hypertension Body mass index [BMI] 40.0-44.9, adult  PAIN:  Are you having pain? Yes NPRS scale: 10/10 Pain location: low back and neck Pain orientation: Bilateral  PAIN TYPE: aching and burning Pain description: constant  Aggravating factors: activity Relieving factors: none  PRECAUTIONS: None  WEIGHT BEARING RESTRICTIONS No  FALLS:  Has patient fallen in last 6 months? No, Number of falls: 0  LIVING ENVIRONMENT: Lives with: lives with their family Lives in: House/apartment Stairs: Yes; Internal: 3 steps; can reach both Has following equipment at home: Single point cane  OCCUPATION: disabled  PLOF: Independent with transfers and Requires assistive device for independence  PATIENT GOALS To lessen and manage my pain   OBJECTIVE:   DIAGNOSTIC FINDINGS:  FINDINGS: Frontal and lateral views obtained. There are pedicle screws from a posterior approach at L3, L4, and L5 with screw tips in the respective  vertebral bodies. There is an apparent new disc spacer at L3-4 with an older disc spacer at L4-5 which appears unchanged in position. No evident fracture or spondylolisthesis. Moderate disc space narrowing again noted at L4-5.   IMPRESSION: Pedicle screws bilaterally at L3, L4, and L5 with screw tips in respective vertebral bodies. Disc spacers at L3-4 and L4-5. No fracture or spondylolisthesis. Moderate disc space narrowing again noted at L4-5.  PATIENT SURVEYS:  Modified Oswestry 25/50   SCREENING FOR RED FLAGS: Bowel or bladder incontinence: No  COGNITION:  Overall cognitive status: Within functional limits for tasks assessed     SENSATION:  Light touch: Appears intact  MUSCLE LENGTH: Hamstrings: Right 80 deg; Left 80 deg  POSTURE:  Slightly forward flexed and SB R  PALPATION: undetermined  LUMBARAROM/PROM  A/PROM A/PROM  10/17/2021  Flexion 33%  Extension 0%  Right lateral flexion   Left lateral flexion   Right rotation   Left rotation    (Blank rows = not tested)  LE AROM/PROM:  A/PROM Right 10/17/2021 Left 10/17/2021  Hip flexion 90d 90d  Hip extension    Hip abduction    Hip adduction    Hip internal rotation    Hip external rotation    Knee flexion 120d 120d  Knee extension 0d 0d  Ankle dorsiflexion    Ankle plantarflexion    Ankle inversion    Ankle eversion     (Blank rows = not tested)  LE MMT:  MMT Right 10/17/2021 Left 10/17/2021  Hip flexion 4 4  Hip extension 4 4  Hip abduction    Hip adduction    Hip internal rotation    Hip external rotation    Knee flexion 4 4  Knee extension 4 4  Ankle dorsiflexion    Ankle plantarflexion    Ankle inversion    Ankle eversion     (Blank rows = not tested)  LUMBAR SPECIAL TESTS:  None performed due to pain  FUNCTIONAL TESTS:  Deferred due to pain  GAIT: Distance walked: 75 Assistive device utilized: Single point cane Level of assistance: Modified independence Comments: antalgic  gait with pain    TODAY'S TREATMENT  10/17/21 Evaluation and HEP   PATIENT EDUCATION:  Education details: Discussed eval findings, rehab rationale and  POC and patient is in agreement  Person educated: Patient Education method: Explanation and Handouts Education comprehension: verbalized understanding   HOME EXERCISE PROGRAM: Access Code: [email or web address] URL: [email or web address] Date: 10/17/2021 Prepared by: Sharlynn Oliphant  Exercises Supine Posterior Pelvic Tilt - 2 x daily - 7 x weekly - 2 sets - 10 reps - 3s hold Curl Up with Arms Crossed - 2 x daily - 7 x weekly - 2 sets - 15 reps Supine March - 2 x daily - 7 x weekly - 2 sets - 10 reps Hooklying Single Knee to Chest Stretch with Towel - 2 x daily - 7 x weekly - 1 sets - 3 reps - 30s hold   ASSESSMENT:  CLINICAL IMPRESSION: Patient is a 61 y.o. male who was seen today for physical therapy evaluation and treatment for chronic low back pain with concurrent knee and cervical pain.  Patient shows limited cervical and lumbar ROM due to pain/guarding as well as L knee pain.  Initially frustrated and discouraged at start of session but more optimistic at end of session.   OBJECTIVE IMPAIRMENTS Abnormal gait, decreased activity tolerance, decreased mobility, difficulty walking, decreased ROM, decreased strength, postural dysfunction, and pain.   ACTIVITY LIMITATIONS community activity, driving, and yard work.   PERSONAL FACTORS Past/current experiences, Time since onset of injury/illness/exacerbation, and 1-2 comorbidities: cervical and lumbar fusions  are also affecting patient's functional outcome.    REHAB POTENTIAL: Fair based on limited treatment post surgeries  CLINICAL DECISION MAKING: Evolving/moderate complexity  EVALUATION COMPLEXITY: Moderate   GOALS: Goals reviewed with patient? Yes  SHORT TERM GOALS:  STG Name Target Date Goal status  1 Patient to demonstrate independence in HEP  Baseline:  [id number] 11/14/2021 INITIAL  2 Initiate aquatic program Baseline: TBD 11/14/2021 INITIAL  3 Assess gait speed and set appropriate goal Baseline: TBD 11/14/2021 INITIAL   LONG TERM GOALS:   LTG Name Target Date Goal status  1 Increase lumbar ROM to 50% flexion and 25% extension Baseline: 33% FF and 0% extension 12/12/2021 INITIAL  2 Increase B hip/knee F/E strength to 4+/5 Baseline: B hip/knee extension strength 4/5 12/12/2021 INITIAL  3 Assess progress towards gait speed goal. Baseline: TBD 12/12/2021 INITIAL  4 Decrease ODI score to 40% Baseline: Initial score 50% 12/12/2021 INITIAL   PLAN: PT FREQUENCY: 1-2x/week  PT DURATION: 8 weeks  PLANNED INTERVENTIONS: Therapeutic exercises, Therapeutic activity, Neuro Muscular re-education, Balance training, Gait training, Patient/Family education, Joint mobilization, Stair training, and Aquatic Therapy  PLAN FOR NEXT SESSION: Initiate aquatic program   Lanice Shirts, PT 10/17/2021, 9:09 AM   Check all possible CPT codes: 23762- Therapeutic Exercise, [phone]- Neuro Re-education, [phone] - Gait Training, [phone] - Manual Therapy, 97530 - Therapeutic Activities, and [id number] - Aquatic therapy

## 2021-10-20 ENCOUNTER — Other Ambulatory Visit: Payer: Self-pay | Admitting: Internal Medicine

## 2021-10-22 ENCOUNTER — Ambulatory Visit: Payer: Medicaid Other

## 2021-10-22 ENCOUNTER — Other Ambulatory Visit: Payer: Self-pay

## 2021-10-22 DIAGNOSIS — M6281 Muscle weakness (generalized): Secondary | ICD-10-CM | POA: Diagnosis not present

## 2021-10-22 DIAGNOSIS — M48061 Spinal stenosis, lumbar region without neurogenic claudication: Secondary | ICD-10-CM

## 2021-10-22 NOTE — Therapy (Signed)
OUTPATIENT PHYSICAL THERAPY TREATMENT NOTE   Patient Name: [name] MRN: [id number] DOB:[birth year], 61 y.o., male Today's Date: 10/22/2021  PCP: [facility] PROVIDER: Earnie Larsson, MD   PT End of Session - 10/22/21 1223     Visit Number 2    Number of Visits 8    Date for PT Re-Evaluation 11/14/21    Authorization Type MCD    Progress Note Due on Visit 8    PT Start Time 1215    PT Stop Time 1300    PT Time Calculation (min) 45 min    Activity Tolerance Patient tolerated treatment well    Behavior During Therapy [facility] for tasks assessed/performed             Past Medical History:  Diagnosis Date   Atrial fibrillation [facility])    s/p afib ablation  PVI 5/12 JA   CAD -nonobstructive    S/P nstemi-type II cath 05/2008 - nonobs. dzs.   Cataract    left eye 2019   Colon polyps    Complication of anesthesia    last 2 surgeries pt had some respiratory issues following surgery required ED visit, dx with bronchitis.    DDD (degenerative disc disease)    evaluate by neurosurgery is ongoing   Depression    ptsd   Duodenal adenoma    Erectile dysfunction    GERD (gastroesophageal reflux disease)    Headache(784.0)    Hemorrhoids    HTN (hypertension)    Hyperlipemia    Hyperthyroidism    Graves dz on methimazole and followed by [name]   Myocardial infarction [facility])    2009   Obesity    Pancreatitis    Polysubstance abuse ([place])    cocaine, last used 1999   Presence of permanent cardiac pacemaker    since 2009   PUD (peptic ulcer disease)    gastritis due to ETOH previously   Pulmonary embolism ([place]) 04/2003   Hx of   Rectal bleeding    Sinus node dysfunction (HCC)    s/p MDT PPM - AAI - V port plugged   Sleep apnea    uses CPAP [facility] 2 YRS   Systolic and diastolic CHF, chronic (HCC)    EF 35% by most recent echo 09/19/10   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   recent ly had tooth pulled painful   Past Surgical History:  Procedure  Laterality Date   ANTERIOR CERVICAL DECOMP/DISCECTOMY FUSION  12/15/2011   Procedure: ANTERIOR CERVICAL DECOMPRESSION/DISCECTOMY FUSION 1 LEVEL;  Surgeon: [name], MD;  Location: [facility];  Service: Neurosurgery;  Laterality: N/A;  Cervical Six-Seven Anterior Cervical Diskectomy fusion with Allograft and Plating   ANTERIOR CERVICAL DECOMP/DISCECTOMY FUSION N/A 01/01/2020   Procedure: Anterior Cervical Discectomy Fusion - Cervical Seven- Thoracic One;  Surgeon: Earnie Larsson, MD;  Location: [place];  Service: Neurosurgery;  Laterality: N/A;  Anterior Cervical Discectomy Fusion - Cervical Seven- Thoracic One   ATRIAL ABLATION SURGERY  12/2010   afib ablation by [name]   ATRIAL FIBRILLATION ABLATION N/A 05/06/2017   Procedure: Atrial Fibrillation Ablation;  Surgeon: Thompson Grayer, MD;  Location: [place] [facility];  Service: Cardiovascular;  Laterality: N/A;   BACK SURGERY     CARDIAC CATHETERIZATION     CADIAC ABLATION [name] 12/2010   CARDIAC CATHETERIZATION N/A 05/08/2016   Procedure: Left Heart Cath and Coronary Angiography;  Surgeon: Peter M Martinique, MD;  Location: [place] [facility];  Service: Cardiovascular;  Laterality: N/A;   Fingers removed from right hand.  2/84   traumatic work injury   INSERT / REPLACE / REMOVE PACEMAKER     05/2008     LUMBAR SPINE SURGERY  02/2009   Dr. Trenton Gammon   PACEMAKER INSERTION  10/09   by Dr [name]   TEE WITHOUT CARDIOVERSION N/A 05/05/2017   Procedure: TRANSESOPHAGEAL ECHOCARDIOGRAM (TEE);  Surgeon: Larey Dresser, MD;  Location: [facility] ENDOS[facility];  Service: Cardiovascular;  Laterality: N/A;   Patient Active Problem List   Diagnosis Date Noted   Degenerative spondylolisthesis 10/07/2020   Cervical spondylosis with myelopathy and radiculopathy 01/01/2020   Acute maxillary sinusitis 09/12/2018   Paroxysmal atrial fibrillation ([place]) 05/06/2017   Acute on chronic diastolic congestive heart failure, NYHA class 4 ([place]) 04/03/2017   Back pain 11/12/2015    Scrotal varicose veins 07/11/2015   Varicocele 07/11/2015   High risk medication use - Tikosyn 09/16/2014   Chronic anticoagulation-Pradaxa 09/13/2014   Muscle twitch 09/11/2014   Leg cramps 09/11/2014   Irritant contact dermatitis 10/03/2013   Eunuchoidism 10/03/2013   Hypogonadism male 10/03/2013   Mixed, or nondependent drug abuse, in remission ([place]) 09/13/2013   Substance abuse in remission ([place]) 09/13/2013   Spinal stenosis, lumbar region, with neurogenic claudication 05/23/2013   CAD -nonobstructive    Preoperative cardiovascular examination 11/17/2012   Pulmonary embolism ([place]) 09/02/2012   Acid reflux 09/02/2012   Sinus node dysfunction ([place]) 09/02/2012   Adiposity 02/12/2012   Obesity 02/12/2012   Cervical disc herniation 12/15/2011   Allergic rhinitis 09/21/2011   Atrial fibrillation with RVR ([place]) 07/08/2011   Discomfort 06/19/2011   Male erectile dysfunction 92/33/0076   Lichen planus 22/63/3354   Decreased libido 06/19/2011   Testicular discomfort 06/19/2011   Essential hypertension 04/14/2011   Dermatitis, eczematoid 02/26/2011   Eczema 02/26/2011   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   Degeneration of intervertebral disc of lumbosacral region 01/06/2011   Chronic atrial fibrillation ([place]) 01/06/2011   DDD (degenerative disc disease), lumbosacral 01/06/2011   Graves disease 10/30/2010   CARPAL TUNNEL SYNDROME, LEFT 10/16/2010   SICK SINUS/ TACHY-BRADY SYNDROME 10/16/2010   Ischemic cardiomyopathy 09/21/2010   HLD (hyperlipidemia) 09/16/2010   PPM-Medtronic- 2009 06/27/2010   DYSPNEA 06/06/2010   Abnormal cardiac CT angiography 06/06/2010   Seasonal and perennial allergic rhinitis 02/05/2010   OTHER CHEST PAIN 11/27/2009   ERECTILE DYSFUNCTION, ORGANIC 07/18/2009   OSA (obstructive sleep apnea) 07/08/2009   COMBINED HEART FAILURE, CHRONIC 05/30/2009   LUMBAR RADICULOPATHY, RIGHT 11/05/2008   HIATAL HERNIA 07/04/2008   HYPERTHYROIDISM 08/30/2007   Dyslipidemia  08/30/2007   ALCOHOL ABUSE 08/30/2007   DEPRESSION 08/30/2007   PAF (paroxysmal atrial fibrillation) ([place]) 08/30/2007   GERD (gastroesophageal reflux disease) 08/30/2007   PULMONARY EMBOLISM, HX OF 08/30/2007   DRUG ABUSE, HX OF 08/30/2007    REFERRING DIAG: Muscle weakness (generalized)  Lumbar stenosis without neurogenic claudication  THERAPY DIAG:  Muscle weakness (generalized)  Lumbar stenosis without neurogenic claudication  PERTINENT HISTORY: Uncomplicated opioid dependence Chronic pain disorder Essential (primary) hypertension Body mass index [BMI] 40.0-44.9, adult    PRECAUTIONS: none  SUBJECTIVE: Neck and back have been sore since the weekend, does not attribute it to exercising though  PAIN:  Are you having pain? Yes NPRS scale: 6/10 Pain location: low back Pain orientation: Bilateral  PAIN TYPE: aching Pain description: constant  Aggravating factors: activity Relieving factors: rest, supine, sitting       OBJECTIVE:    DIAGNOSTIC FINDINGS:  FINDINGS: Frontal  and lateral views obtained. There are pedicle screws from a posterior approach at L3, L4, and L5 with screw tips in the respective vertebral bodies. There is an apparent new disc spacer at L3-4 with an older disc spacer at L4-5 which appears unchanged in position. No evident fracture or spondylolisthesis. Moderate disc space narrowing again noted at L4-5.   IMPRESSION: Pedicle screws bilaterally at L3, L4, and L5 with screw tips in respective vertebral bodies. Disc spacers at L3-4 and L4-5. No fracture or spondylolisthesis. Moderate disc space narrowing again noted at L4-5.   PATIENT SURVEYS:  Modified Oswestry 25/50    SCREENING FOR RED FLAGS: Bowel or bladder incontinence: No   COGNITION:          Overall cognitive status: Within functional limits for tasks assessed                        SENSATION:          Light touch: Appears intact   MUSCLE LENGTH: Hamstrings: Right 80 deg;  Left 80 deg   POSTURE:  Slightly forward flexed and SB R   PALPATION: undetermined   LUMBARAROM/PROM   A/PROM A/PROM  10/17/2021  Flexion 33%  Extension 0%  Right lateral flexion    Left lateral flexion    Right rotation    Left rotation     (Blank rows = not tested)   LE AROM/PROM:   A/PROM Right 10/17/2021 Left 10/17/2021  Hip flexion 90d 90d  Hip extension      Hip abduction      Hip adduction      Hip internal rotation      Hip external rotation      Knee flexion 120d 120d  Knee extension 0d 0d  Ankle dorsiflexion      Ankle plantarflexion      Ankle inversion      Ankle eversion       (Blank rows = not tested)   LE MMT:   MMT Right 10/17/2021 Left 10/17/2021  Hip flexion 4 4  Hip extension 4 4  Hip abduction      Hip adduction      Hip internal rotation      Hip external rotation      Knee flexion 4 4  Knee extension 4 4  Ankle dorsiflexion      Ankle plantarflexion      Ankle inversion      Ankle eversion       (Blank rows = not tested)   LUMBAR SPECIAL TESTS:  None performed due to pain   FUNCTIONAL TESTS:  Deferred due to pain   GAIT: Distance walked: 75 Assistive device utilized: Single point cane Level of assistance: Modified independence Comments: antalgic gait with pain       TODAY'S TREATMENT  [facility] Adult PT Treatment:                                                DATE: 10/22/21 Therapeutic Exercise: Nustep L2 8 min PPT 3s x15 Curl ups x15 Supine marches x10 alt SKTC 30s x3 SL clams 15x B Open book 10x B Latissimus press w/inspiration 10x Lat press with LAQs alt 10x Lat press with marches alt 10x   10/17/21 Evaluation and HEP     PATIENT EDUCATION:  Education details: Discussed  eval findings, rehab rationale and POC and patient is in agreement  Person educated: Patient Education method: Explanation and Handouts Education comprehension: verbalized understanding     HOME EXERCISE PROGRAM: Access Code: [email or web address] URL:  [email or web address] Date: 10/17/2021 Prepared by: Sharlynn Oliphant   Exercises Supine Posterior Pelvic Tilt - 2 x daily - 7 x weekly - 2 sets - 10 reps - 3s hold Curl Up with Arms Crossed - 2 x daily - 7 x weekly - 2 sets - 15 reps Supine March - 2 x daily - 7 x weekly - 2 sets - 10 reps Hooklying Single Knee to Chest Stretch with Towel - 2 x daily - 7 x weekly - 1 sets - 3 reps - 30s hold     ASSESSMENT:   CLINICAL IMPRESSION: Initiated aerobic work as warm up prior to exercises and stretching.  Has been consistent with HEP and has scheduled aquatic therapy visits.  Reviewed HEP for consistency, added additional stretches and core strengthening tasks   OBJECTIVE IMPAIRMENTS Abnormal gait, decreased activity tolerance, decreased mobility, difficulty walking, decreased ROM, decreased strength, postural dysfunction, and pain.    ACTIVITY LIMITATIONS community activity, driving, and yard work.    PERSONAL FACTORS Past/current experiences, Time since onset of injury/illness/exacerbation, and 1-2 comorbidities: cervical and lumbar fusions  are also affecting patient's functional outcome.      REHAB POTENTIAL: Fair based on limited treatment post surgeries   CLINICAL DECISION MAKING: Evolving/moderate complexity   EVALUATION COMPLEXITY: Moderate     GOALS: Goals reviewed with patient? Yes   SHORT TERM GOALS:   STG Name Target Date Goal status  1 Patient to demonstrate independence in HEP  Baseline: [id number] 11/14/2021 INITIAL  2 Initiate aquatic program Baseline: TBD 11/14/2021 INITIAL  3 Assess gait speed and set appropriate goal Baseline: TBD 11/14/2021 INITIAL    LONG TERM GOALS:    LTG Name Target Date Goal status  1 Increase lumbar ROM to 50% flexion and 25% extension Baseline: 33% FF and 0% extension 12/12/2021 INITIAL  2 Increase B hip/knee F/E strength to 4+/5 Baseline: B hip/knee extension strength 4/5 12/12/2021 INITIAL  3 Assess progress towards gait  speed goal. Baseline: TBD 12/12/2021 INITIAL  4 Decrease ODI score to 40% Baseline: Initial score 50% 12/12/2021 INITIAL    PLAN: PT FREQUENCY: 1-2x/week   PT DURATION: 8 weeks   PLANNED INTERVENTIONS: Therapeutic exercises, Therapeutic activity, Neuro Muscular re-education, Balance training, Gait training, Patient/Family education, Joint mobilization, Stair training, and Aquatic Therapy   PLAN FOR NEXT SESSION: Advance dry land sessions for core strength and flexibility with goal of HEP for long term management    Lanice Shirts, PT 10/22/2021, 12:24 PM

## 2021-10-30 ENCOUNTER — Other Ambulatory Visit: Payer: Self-pay

## 2021-10-30 ENCOUNTER — Ambulatory Visit: Payer: Medicaid Other | Admitting: Nurse Practitioner

## 2021-10-30 ENCOUNTER — Encounter: Payer: Self-pay | Admitting: Nurse Practitioner

## 2021-10-30 VITALS — BP 140/82 | HR 73 | Ht 71.0 in | Wt 293.0 lb

## 2021-10-30 DIAGNOSIS — J3089 Other allergic rhinitis: Secondary | ICD-10-CM | POA: Diagnosis not present

## 2021-10-30 DIAGNOSIS — R058 Other specified cough: Secondary | ICD-10-CM

## 2021-10-30 DIAGNOSIS — I5042 Chronic combined systolic (congestive) and diastolic (congestive) heart failure: Secondary | ICD-10-CM | POA: Diagnosis not present

## 2021-10-30 DIAGNOSIS — G4733 Obstructive sleep apnea (adult) (pediatric): Secondary | ICD-10-CM

## 2021-10-30 DIAGNOSIS — J302 Other seasonal allergic rhinitis: Secondary | ICD-10-CM

## 2021-10-30 DIAGNOSIS — I480 Paroxysmal atrial fibrillation: Secondary | ICD-10-CM

## 2021-10-30 MED ORDER — BENZONATATE 200 MG PO CAPS: 200.0000 mg | ORAL_CAPSULE | Freq: Three times a day (TID) | ORAL | 1 refills | Status: DC | PRN

## 2021-10-30 MED ORDER — LORATADINE 10 MG PO TABS: 10.0000 mg | ORAL_TABLET | Freq: Every day | ORAL | 1 refills | Status: DC

## 2021-10-30 NOTE — Progress Notes (Signed)
@Patient  ID: [name], male    DOB: 20-Nov-1960, 61 y.o.   MRN: [id number]  No chief complaint on file.   Referring provider: [facility]  HPI: 62 year old male, former smoker (15 pack years) followed for obstructive sleep apnea and upper airway cough syndrome.  He is a patient of Dr. Janee Morn and last seen 05/21/2021.  Past medical history significant for PAF on Eliquis, combined heart failure, sick sinus syndrome, CAD, hypertension, seasonal allergies, GERD, Graves' disease, DDD, alcohol abuse, ED, history of pulmonary embolism, history of drug abuse in remission, HLD, obesity.  TEST/EVENTS:  2010 NPSG: AHI 21/h 11/20/2020 echocardiogram: EF 60-65%, LVH. G1DD.  Trivial MR.  Aortic dilatation noted.  Previous echocardiogram with EF 40 to 45% so this is an improvement. 04/25/2021 CXR 2 view: Low lung volumes.  Coarsened interstitial markings with no confluent airspace disease.  05/21/2021: OV with Dr. Annamaria Boots.  Persistent sinus pressure and postnasal drip.  Patient was concerned that CPAP was contributing to this.  Scheduled to see ENT following week.  Has good compliance with auto CPAP 5-15.  10/30/2021: Today-acute visit Patient presents today for concerns regarding CPAP and how it contributes to his sinuses and cough.  He has been having a chronic productive cough, worsening nasal drainage and postnasal drip, and sinus pressure over the last year.  He describes his sputum as clear to white and unchanged from baseline.  He has had no issues in his breathing.  He has been evaluated by ENT who did not find anything structurally wrong.  He was told by most recent ENT that CPAP is probably causing most of his problems.  Today, he reports good compliance with CPAP therapy and continues to get good benefit as far as fatigue symptoms go.  He recently switched to a small full facemask with hopes that this would help his symptoms some however the mask feels like it is too small and is  cutting into his upper nose and leaves him with puffy eyes in the morning.  He is mostly concerned about his cough and nasal symptoms.  He is currently on Flonase and Atrovent nasal spray.  He has been worried about starting any over-the-counter allergy medicines because of his heart condition.  He has received good benefit from Benadryl and like medicines in the past.  Currently using NyQuil cough and honey at night which has been helping significantly with his cough.  He denies any fevers, orthopnea, PND, lower extremity swelling.  Airview download: CPAP auto 5-15 cmH2O 30 out of 30 days, 97%> 4 hours; average use 6 hours 28 minutes Pressure median 10.9, 95th 13.5 Leaks median 0.6, 95th 18.4 AHI 2.1  Allergies  Allergen Reactions   Celecoxib Itching   Hydrocodone Itching   Prednisolone Itching   Potassium Chloride Other (See Comments)    Dizziness and nausea     Immunization History  Administered Date(s) Administered   Influenza Split 08/31/2009, 06/02/2011, 05/13/2012, 05/02/2015, 05/31/2017, 05/01/2018, 05/31/2018   Influenza Whole 08/30/2007, 07/18/2009   Influenza, High Dose Seasonal PF 05/31/2016   Influenza, Seasonal, Injecte, Preservative Fre 05/02/2014, 08/05/2015   Influenza,inj,Quad PF,6+ Mos 05/24/2013, 05/18/2019   Influenza-Unspecified 06/09/2011, 09/13/2013, 10/03/2013, 04/01/2015   PFIZER(Purple Top)SARS-COV-2 Vaccination 11/29/2019, 12/20/2019, 12/19/2020   Pneumococcal Polysaccharide-23 09/09/2012   Pneumococcal-Unspecified 08/31/2009   Td 07/18/2009   Tdap 08/31/2009, 09/27/2012, 05/31/2014, 10/29/2015    Past Medical History:  Diagnosis Date   Atrial fibrillation ([place])    s/p afib ablation  PVI 5/12 JA  CAD -nonobstructive    S/P nstemi-type II cath 05/2008 - nonobs. dzs.   Cataract    left eye 2019   Colon polyps    Complication of anesthesia    last 2 surgeries pt had some respiratory issues following surgery required ED visit, dx with bronchitis.     DDD (degenerative disc disease)    evaluate by neurosurgery is ongoing   Depression    ptsd   Duodenal adenoma    Erectile dysfunction    GERD (gastroesophageal reflux disease)    Headache(784.0)    Hemorrhoids    HTN (hypertension)    Hyperlipemia    Hyperthyroidism    Graves dz on methimazole and followed by [name]   Myocardial infarction [facility])    2009   Obesity    Pancreatitis    Polysubstance abuse ([place])    cocaine, last used 1999   Presence of permanent cardiac pacemaker    since 2009   PUD (peptic ulcer disease)    gastritis due to ETOH previously   Pulmonary embolism ([place]) 04/2003   Hx of   Rectal bleeding    Sinus node dysfunction ([facility])    s/p MDT PPM - AAI - V port plugged   Sleep apnea    uses CPAP [facility] 2 YRS   Systolic and diastolic CHF, chronic ([facility])    EF 35% by most recent echo 09/19/10   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   recent ly had tooth pulled painful    Tobacco History: Social History   Tobacco Use  Smoking Status Former   Packs/day: 1.00   Years: 15.00   Pack years: 15.00   Types: Cigarettes   Quit date: 04/04/2003   Years since quitting: 18.5  Smokeless Tobacco Never   Counseling given: Not Answered   Outpatient Medications Prior to Visit  Medication Sig Dispense Refill   amoxicillin-clavulanate (AUGMENTIN) 875-125 MG tablet Take 1 tablet by mouth 2 (two) times daily. 14 tablet 0   atorvastatin (LIPITOR) 40 MG tablet Take 40 mg by mouth daily.     Budeson-Glycopyrrol-Formoterol (BREZTRI AEROSPHERE) 160-9-4.8 MCG/ACT AERO Inhale 2 puffs into the lungs in the morning and at bedtime. 10.7 g 0   carvedilol (COREG) 25 MG tablet TAKE 1 TABLET BY MOUTH TWICE A DAY WITH MEALS 180 tablet 2   desonide (DESOWEN) 0.05 % cream Apply 1 application topically 2 (two) times daily as needed (for rash).      dexlansoprazole (DEXILANT) 60 MG capsule Take 1 capsule (60 mg total) by mouth daily. Please schedule a yearly office visit for further refills. Thank you  90 capsule 0   dofetilide (TIKOSYN) 500 MCG capsule Take 1 capsule (500 mcg total) by mouth 2 (two) times daily. 180 capsule 3   doxycycline (VIBRA-TABS) 100 MG tablet Take 1 tablet (100 mg total) by mouth 2 (two) times daily. 14 tablet 0   ELIQUIS 5 MG TABS tablet TAKE 1 TABLET BY MOUTH TWICE A DAY 60 tablet 5   gabapentin (NEURONTIN) 100 MG capsule Take 1-2 capsules (100-200 mg total) by mouth See admin instructions. Patient takes 1 tablet in the morning and 2 tablets at night 60 capsule 3   methimazole (TAPAZOLE) 10 MG tablet Take 1 tablet (10 mg total) by mouth daily. 30 tablet 1   oxyCODONE 10 MG TABS Take 1 tablet (10 mg total) by mouth every 4 (four) hours as needed for severe pain ((score 7 to 10)). 30 tablet 0   triamcinolone ointment (KENALOG)  0.1 % Apply topically 2 (two) times daily as needed (for rash).  3   No facility-administered medications prior to visit.     Review of Systems:   Constitutional: No weight loss or gain, night sweats, fevers, chills, fatigue, or lassitude. HEENT: No headaches, difficulty swallowing, tooth/dental problems, or sore throat. No sneezing, itching, ear ache +nasal congestion,post nasal drip, sinus pressure CV:  No chest pain, orthopnea, PND, swelling in lower extremities, anasarca, dizziness, palpitations, syncope Resp: +productive cough, paroxysmal at times. No shortness of breath with exertion or at rest. No excess mucus or change in color of mucus. No hemoptysis. No wheezing.  No chest wall deformity GI:  No heartburn, indigestion, abdominal pain, nausea, vomiting, diarrhea, change in bowel habits, loss of appetite, bloody stools.  GU: No dysuria, change in color of urine, urgency or frequency.  No flank pain, no hematuria  Skin: No rash, lesions, ulcerations MSK:  No joint pain or swelling.  No decreased range of motion.  No back pain. Neuro: No dizziness or lightheadedness.  Psych: No depression or anxiety. Mood stable.     Physical  Exam:  BP 140/82    Pulse 73    Ht 5\' 11"  (1.803 m)    Wt 293 lb (132.9 kg)    SpO2 96%    BMI 40.87 kg/m   GEN: Pleasant, interactive, well-appearing; morbidly obese; in no acute distress. HEENT:  Normocephalic and atraumatic. EACs patent bilaterally. TM pearly gray with present light reflex bilaterally. PERRLA. Sclera white. Nasal turbinates erythematous, moist and patent bilaterally. Clear rhinorrhea present. Oropharynx erythematous and moist, without exudate or edema. No lesions, ulcerations NECK:  Supple w/ fair ROM. No JVD present. Normal carotid impulses w/o bruits. Thyroid symmetrical with no goiter or nodules palpated. No lymphadenopathy.   CV: RRR, no m/r/g, no peripheral edema. Pulses intact, +2 bilaterally. No cyanosis, pallor or clubbing. PULMONARY:  Unlabored, regular breathing. Clear bilaterally A&P w/o wheezes/rales/rhonchi. No accessory muscle use. No dullness to percussion. GI: BS present and normoactive. Soft, non-tender to palpation. No organomegaly or masses detected. No CVA tenderness. MSK: No erythema, warmth or tenderness. Cap refil <2 sec all extrem. No deformities or joint swelling noted.  Neuro: A/Ox3. No focal deficits noted.   Skin: Warm, no lesions or rashe Psych: Normal affect and behavior. Judgement and thought content appropriate.     Lab Results:  CBC    Component Value Date/Time   WBC 6.3 04/25/2021 0955   WBC 5.8 10/01/2020 1506   RBC 5.80 04/25/2021 0955   RBC 5.25 10/01/2020 1506   HGB 15.1 04/25/2021 0955   HCT 46.5 04/25/2021 0955   PLT 270 04/25/2021 0955   MCV 80 04/25/2021 0955   MCH 26.0 (L) 04/25/2021 0955   MCH 26.7 10/01/2020 1506   MCHC 32.5 04/25/2021 0955   MCHC 31.8 10/01/2020 1506   RDW 17.0 (H) 04/25/2021 0955   LYMPHSABS 2.3 10/01/2020 1506   LYMPHSABS 2.1 04/28/2017 1130   MONOABS 0.6 10/01/2020 1506   EOSABS 0.2 10/01/2020 1506   EOSABS 0.1 04/28/2017 1130   BASOSABS 0.0 10/01/2020 1506   BASOSABS 0.0 04/28/2017 1130     BMET    Component Value Date/Time   NA 136 04/25/2021 0955   K 4.4 04/25/2021 0955   CL 98 04/25/2021 0955   CO2 23 04/25/2021 0955   GLUCOSE 88 04/25/2021 0955   GLUCOSE 142 (H) 10/08/2020 0516   BUN 20 04/25/2021 0955   CREATININE 1.09 04/25/2021 0955   CREATININE 0.96  05/06/2016 0844   CALCIUM 9.5 04/25/2021 0955   GFRNONAA >60 10/08/2020 0516   GFRAA 83 09/19/2020 1144    BNP    Component Value Date/Time   BNP 280.5 (H) 04/04/2017 0023     Imaging:  CT CERVICAL SPINE WO CONTRAST  Result Date: 10/16/2021 CLINICAL DATA:  Chronic neck pain. Pain radiates into both arms. Numbness and tingling of the left elbow. Spinal stenosis, cervical region. EXAM: CT CERVICAL SPINE WITHOUT CONTRAST TECHNIQUE: Multidetector CT imaging of the cervical spine was performed without intravenous contrast. Multiplanar CT image reconstructions were also generated. RADIATION DOSE REDUCTION: This exam was performed according to the departmental dose-optimization program which includes automated exposure control, adjustment of the mA and/or kV according to patient size and/or use of iterative reconstruction technique. COMPARISON:  Radiography 02/07/2020.  CT 08/22/2019. FINDINGS: Alignment: No malalignment. Skull base and vertebrae: Distant solid ACDF at C6-7. Removal of previous anterior plate. Interval ACDF at C7-T1 since the previous study. Apparent solid union at this level. Soft tissues and spinal canal: Negative Disc levels: The foramen magnum is widely patent. There is ordinary mild osteoarthritis of the C1-2 articulation but no encroachment upon the neural structures. C2-3: Normal interspace. C3-4: Chronic disc degeneration with loss of disc height. Endplate osteophytes more prominent on the left than the right. Bilateral bony foraminal stenosis that could affect either C4 nerve. This is worse on the left. C4-5: Chronic disc degeneration with loss of disc height. Endplate osteophytes and bulging of  the disc. Bilateral foraminal narrowing that could affect either C5 nerve. C5-6: No significant osteophytes. Mild facet osteoarthritis. No apparent compressive narrowing of the canal or foramina. C6-7: Previous ACDF with solid union and sufficient patency of the canal and foramina. C6-7: ACDF with apparent solid union. Sufficient patency of the canal and foramina. T1-2: Facet osteoarthritis more pronounced on the left than the right. Ligamentous calcification on the left. Canal narrowing left more than right. Foraminal stenosis left more than right. Findings appear similar to the previous exam. Upper chest: Negative Other: None IMPRESSION: Solid union from C6-C7 with sufficient patency of the canal and foramina. Chronic degenerative spondylosis at C3-4 with bilateral foraminal stenosis left worse than right that could be significant. Chronic degenerative spondylosis at C4-5 with bilateral foraminal stenosis that could be significant. Chronic degenerative spondylosis and facet arthropathy at T1-2, worse on the left than the right, with canal narrowing left worse than right and bilateral foraminal stenosis left worse than right that could be significant. Electronically Signed   By: Nelson Chimes M.D.   On: 10/16/2021 19:26      No flowsheet data found.  No results found for: NITRICOXIDE      Assessment & Plan:   Upper airway cough syndrome Persistent chronic symptoms over the past year.  Previous imaging without any remarkable findings.  Suspect primarily related to postnasal drainage and allergic rhinitis.  He has been hesitant to start antihistamines due to effects on his heart.  We discussed that Claritin or Zyrtec would be safe as long as there is no decongestant.  Rx sent for Claritin.  Target trigger prevention and cough suppression.   Patient Instructions  Continue to use CPAP every night, minimum of 4-6 hours a night.  Change equipment every 30 days or as directed by DME. Wash your tubing  with warm soap and water daily, hang to dry. Wash humidifier portion weekly.  Maintain clean equipment, as directed by home health agency.  Be aware of reduced alertness and do  not drive or operate heavy machinery if experiencing this or drowsiness.  Exercise encouraged, as tolerated. Healthy weight management discussed.  Avoid or decrease alcohol consumption and medications that make you more sleepy, if possible. Notify if persistent daytime sleepiness occurs even with consistent use of CPAP.   -Continue flonase 2 sprays each nostril daily -Continue Atrovent 2 sprays Twice daily   Start loratidine (Claritin) 10 mg daily  Delsym 2 tsp in morning for cough  Continue Nyquil cough and honey at night  Tessalon perles 1 capsule every 8 hours as needed for cough Mucinex 600 mg Twice daily   Mask fitting at [place]  Follow up in one month with Dr. Annamaria Boots or Alanson Aly. If symptoms do not improve or worsen, please contact office for sooner follow up or seek emergency care.    OSA (obstructive sleep apnea) Moderate obstructive sleep apnea.  Continues to receive good benefit from CPAP use.  Despite issues with rhinitis, would continue therapy at this point given the risks associated with untreated sleep apnea which we discussed.  Some of his problems are related to mask fit.  We will send for mask desensitization fitting.  Continue auto CPAP 5-15.  We did discuss that he could be a potential candidate for inspire device if he were to get his BMI down.  Patient actively working on healthy weight management.  Seasonal and perennial allergic rhinitis Optimize allergy control.  Continue nasal sprays as prescribed by ENT.  COMBINED HEART FAILURE, CHRONIC Most recent echo with normal EF.  Continues to have diastolic dysfunction.  Appears euvolemic on exam.  Follow-up with cardiology as scheduled.  PAF (paroxysmal atrial fibrillation) ([place]) Rate controlled at visit.  Currently on Tikosyn and  Eliquis.  Follow-up with cardiology as scheduled.   I spent 35 minutes of dedicated to the care of this patient on the date of this encounter to include pre-visit review of records, face-to-face time with the patient discussing conditions above, post visit ordering of testing, clinical documentation with the electronic health record, making appropriate referrals as documented, and communicating necessary findings to members of the patients care team.  Jousha Bibles, NP 10/30/2021  Pt aware and understands NP's role.

## 2021-10-30 NOTE — Assessment & Plan Note (Signed)
Optimize allergy control.  Continue nasal sprays as prescribed by ENT. ?

## 2021-10-30 NOTE — Patient Instructions (Addendum)
Continue to use CPAP every night, minimum of 4-6 hours a night.  ?Change equipment every 30 days or as directed by DME. Wash your tubing with warm soap and water daily, hang to dry. Wash humidifier portion weekly.  ?Maintain clean equipment, as directed by home health agency.  ?Be aware of reduced alertness and do not drive or operate heavy machinery if experiencing this or drowsiness.  ?Exercise encouraged, as tolerated. ?Healthy weight management discussed.  ?Avoid or decrease alcohol consumption and medications that make you more sleepy, if possible. ?Notify if persistent daytime sleepiness occurs even with consistent use of CPAP.  ? ?-Continue flonase 2 sprays each nostril daily ?-Continue Atrovent 2 sprays Twice daily  ? ?Start loratidine (Claritin) 10 mg daily  ?Delsym 2 tsp in morning for cough  ?Continue Nyquil cough and honey at night  ?Tessalon perles 1 capsule every 8 hours as needed for cough ?Mucinex 600 mg Twice daily  ? ?Mask fitting at [facility] ? ?Follow up in one month with Dr. Annamaria Boots or Alanson Aly. If symptoms do not improve or worsen, please contact office for sooner follow up or seek emergency care. ?

## 2021-10-30 NOTE — Assessment & Plan Note (Signed)
Persistent chronic symptoms over the past year.  Previous imaging without any remarkable findings.  Suspect primarily related to postnasal drainage and allergic rhinitis.  He has been hesitant to start antihistamines due to effects on his heart.  We discussed that Claritin or Zyrtec would be safe as long as there is no decongestant.  Rx sent for Claritin.  Target trigger prevention and cough suppression.  ? ?Patient Instructions  ?Continue to use CPAP every night, minimum of 4-6 hours a night.  ?Change equipment every 30 days or as directed by DME. Wash your tubing with warm soap and water daily, hang to dry. Wash humidifier portion weekly.  ?Maintain clean equipment, as directed by home health agency.  ?Be aware of reduced alertness and do not drive or operate heavy machinery if experiencing this or drowsiness.  ?Exercise encouraged, as tolerated. ?Healthy weight management discussed.  ?Avoid or decrease alcohol consumption and medications that make you more sleepy, if possible. ?Notify if persistent daytime sleepiness occurs even with consistent use of CPAP.  ? ?-Continue flonase 2 sprays each nostril daily ?-Continue Atrovent 2 sprays Twice daily  ? ?Start loratidine (Claritin) 10 mg daily  ?Delsym 2 tsp in morning for cough  ?Continue Nyquil cough and honey at night  ?Tessalon perles 1 capsule every 8 hours as needed for cough ?Mucinex 600 mg Twice daily  ? ?Mask fitting at [employer] [facility] [employer] ? ?Follow up in one month with Dr. Annamaria Boots or Alanson Aly. If symptoms do not improve or worsen, please contact office for sooner follow up or seek emergency care. ? ? ?

## 2021-10-30 NOTE — Assessment & Plan Note (Addendum)
Moderate obstructive sleep apnea.  Continues to receive good benefit from CPAP use.  Despite issues with rhinitis, would continue therapy at this point given the risks associated with untreated sleep apnea which we discussed.  Some of his problems are related to mask fit.  We will send for mask desensitization fitting.  Continue auto CPAP 5-15.  We did discuss that he could be a potential candidate for inspire device if he were to get his BMI down.  Patient actively working on healthy weight management. ?

## 2021-10-30 NOTE — Assessment & Plan Note (Signed)
Rate controlled at visit.  Currently on Tikosyn and Eliquis.  Follow-up with cardiology as scheduled. ?

## 2021-10-30 NOTE — Assessment & Plan Note (Signed)
Most recent echo with normal EF.  Continues to have diastolic dysfunction.  Appears euvolemic on exam.  Follow-up with cardiology as scheduled. ?

## 2021-10-30 NOTE — Assessment & Plan Note (Signed)
" >>  ASSESSMENT AND PLAN FOR COMBINED HEART FAILURE, CHRONIC WRITTEN ON 10/30/2021  4:40 PM BY Duey Liller V, NP  Most recent echo with normal EF.  Continues to have diastolic dysfunction.  Appears euvolemic on exam.  Follow-up with cardiology as scheduled. "

## 2021-10-31 ENCOUNTER — Encounter (HOSPITAL_BASED_OUTPATIENT_CLINIC_OR_DEPARTMENT_OTHER): Payer: Self-pay | Admitting: Physical Therapy

## 2021-10-31 ENCOUNTER — Ambulatory Visit (HOSPITAL_BASED_OUTPATIENT_CLINIC_OR_DEPARTMENT_OTHER): Payer: Medicaid Other | Attending: Neurosurgery | Admitting: Physical Therapy

## 2021-10-31 DIAGNOSIS — M6281 Muscle weakness (generalized): Secondary | ICD-10-CM | POA: Insufficient documentation

## 2021-10-31 DIAGNOSIS — M48061 Spinal stenosis, lumbar region without neurogenic claudication: Secondary | ICD-10-CM | POA: Diagnosis present

## 2021-10-31 NOTE — Therapy (Signed)
` OUTPATIENT PHYSICAL THERAPY TREATMENT NOTE   Patient Name: [name] MRN: [id number] DOB:Oct 22, 1960, 61 y.o., male Today's Date: 10/31/2021  PCP: [facility] [place] PROVIDER: Earnie Larsson, MD   PT End of Session - 10/31/21 [phone]     Visit Number 3    Number of Visits 8    Date for PT Re-Evaluation 11/14/21    Authorization Type MCD    Progress Note Due on Visit 8    PT Start Time 0949    PT Stop Time 1035    PT Time Calculation (min) 46 min    Activity Tolerance Patient tolerated treatment well    Behavior During Therapy [facility] for tasks assessed/performed             Past Medical History:  Diagnosis Date   Atrial fibrillation [facility])    s/p afib ablation  PVI 5/12 JA   CAD -nonobstructive    S/P nstemi-type II cath 05/2008 - nonobs. dzs.   Cataract    left eye 2019   Colon polyps    Complication of anesthesia    last 2 surgeries pt had some respiratory issues following surgery required ED visit, dx with bronchitis.    DDD (degenerative disc disease)    evaluate by neurosurgery is ongoing   Depression    ptsd   Duodenal adenoma    Erectile dysfunction    GERD (gastroesophageal reflux disease)    Headache(784.0)    Hemorrhoids    HTN (hypertension)    Hyperlipemia    Hyperthyroidism    Graves dz on methimazole and followed by [name]   Myocardial infarction [facility])    2009   Obesity    Pancreatitis    Polysubstance abuse ([place])    cocaine, last used 1999   Presence of permanent cardiac pacemaker    since 2009   PUD (peptic ulcer disease)    gastritis due to ETOH previously   Pulmonary embolism ([place]) 04/2003   Hx of   Rectal bleeding    Sinus node dysfunction (HCC)    s/p MDT PPM - AAI - V port plugged   Sleep apnea    uses CPAP [facility] 2 YRS   Systolic and diastolic CHF, chronic (HCC)    EF 35% by most recent echo 09/19/10   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   recent ly had tooth pulled painful   Past Surgical History:  Procedure  Laterality Date   ANTERIOR CERVICAL DECOMP/DISCECTOMY FUSION  12/15/2011   Procedure: ANTERIOR CERVICAL DECOMPRESSION/DISCECTOMY FUSION 1 LEVEL;  Surgeon: [name], MD;  [facility];  Service: Neurosurgery;  Laterality: N/A;  Cervical Six-Seven Anterior Cervical Diskectomy fusion with Allograft and Plating   ANTERIOR CERVICAL DECOMP/DISCECTOMY FUSION N/A 01/01/2020   Procedure: Anterior Cervical Discectomy Fusion - Cervical Seven- Thoracic One;  Surgeon: Earnie Larsson, MD;  Location: [place];  Service: Neurosurgery;  Laterality: N/A;  Anterior Cervical Discectomy Fusion - Cervical Seven- Thoracic One   ATRIAL ABLATION SURGERY  12/2010   afib ablation by [name]   ATRIAL FIBRILLATION ABLATION N/A 05/06/2017   Procedure: Atrial Fibrillation Ablation;  Surgeon: Thompson Grayer, MD;  Location: [place] CV LAB;  Service: Cardiovascular;  Laterality: N/A;   BACK SURGERY     CARDIAC CATHETERIZATION     CADIAC ABLATION [name] 12/2010   CARDIAC CATHETERIZATION N/A 05/08/2016   Procedure: Left Heart Cath and Coronary Angiography;  Surgeon: Peter M Martinique, MD;  Location: [place] CV LAB;  Service: Cardiovascular;  Laterality: N/A;   Fingers removed from right hand.  2/84   traumatic work injury   INSERT / REPLACE / REMOVE PACEMAKER     05/2008     LUMBAR SPINE SURGERY  02/2009   Dr. Trenton Gammon   PACEMAKER INSERTION  10/09   by Dr [name]   TEE WITHOUT CARDIOVERSION N/A 05/05/2017   Procedure: TRANSESOPHAGEAL ECHOCARDIOGRAM (TEE);  Surgeon: Larey Dresser, MD;  Location: [facility] ENDOSCOPY;  Service: Cardiovascular;  Laterality: N/A;   Patient Active Problem List   Diagnosis Date Noted   Upper airway cough syndrome 10/30/2021   Degenerative spondylolisthesis 10/07/2020   Cervical spondylosis with myelopathy and radiculopathy 01/01/2020   Acute maxillary sinusitis 09/12/2018   Paroxysmal atrial fibrillation ([place]) 05/06/2017   Acute on chronic diastolic congestive heart failure, NYHA class 4 ([place])  04/03/2017   Back pain 11/12/2015   Scrotal varicose veins 07/11/2015   Varicocele 07/11/2015   High risk medication use - Tikosyn 09/16/2014   Chronic anticoagulation-Pradaxa 09/13/2014   Muscle twitch 09/11/2014   Leg cramps 09/11/2014   Irritant contact dermatitis 10/03/2013   Eunuchoidism 10/03/2013   Hypogonadism male 10/03/2013   Mixed, or nondependent drug abuse, in remission ([place]) 09/13/2013   Substance abuse in remission ([place]) 09/13/2013   Spinal stenosis, lumbar region, with neurogenic claudication 05/23/2013   CAD -nonobstructive    Preoperative cardiovascular examination 11/17/2012   Pulmonary embolism ([place]) 09/02/2012   Acid reflux 09/02/2012   Sinus node dysfunction ([place]) 09/02/2012   Adiposity 02/12/2012   Obesity 02/12/2012   Cervical disc herniation 12/15/2011   Allergic rhinitis 09/21/2011   Atrial fibrillation with RVR ([place]) 07/08/2011   Discomfort 06/19/2011   Male erectile dysfunction 43/15/4008   Lichen planus 67/61/9509   Decreased libido 06/19/2011   Testicular discomfort 06/19/2011   Essential hypertension 04/14/2011   Dermatitis, eczematoid 02/26/2011   Eczema 02/26/2011   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   Degeneration of intervertebral disc of lumbosacral region 01/06/2011   Chronic atrial fibrillation ([place]) 01/06/2011   DDD (degenerative disc disease), lumbosacral 01/06/2011   Graves disease 10/30/2010   CARPAL TUNNEL SYNDROME, LEFT 10/16/2010   SICK SINUS/ TACHY-BRADY SYNDROME 10/16/2010   Ischemic cardiomyopathy 09/21/2010   HLD (hyperlipidemia) 09/16/2010   PPM-Medtronic- 2009 06/27/2010   DYSPNEA 06/06/2010   Abnormal cardiac CT angiography 06/06/2010   Seasonal and perennial allergic rhinitis 02/05/2010   OTHER CHEST PAIN 11/27/2009   ERECTILE DYSFUNCTION, ORGANIC 07/18/2009   OSA (obstructive sleep apnea) 07/08/2009   COMBINED HEART FAILURE, CHRONIC 05/30/2009   LUMBAR RADICULOPATHY, RIGHT 11/05/2008   HIATAL HERNIA 07/04/2008    HYPERTHYROIDISM 08/30/2007   Dyslipidemia 08/30/2007   ALCOHOL ABUSE 08/30/2007   DEPRESSION 08/30/2007   PAF (paroxysmal atrial fibrillation) ([place]) 08/30/2007   GERD (gastroesophageal reflux disease) 08/30/2007   PULMONARY EMBOLISM, HX OF 08/30/2007   DRUG ABUSE, HX OF 08/30/2007    REFERRING DIAG: Muscle weakness (generalized)  Lumbar stenosis without neurogenic claudication  THERAPY DIAG:  Muscle weakness (generalized)  Lumbar stenosis without neurogenic claudication  PERTINENT HISTORY: Uncomplicated opioid dependence Chronic pain disorder Essential (primary) hypertension Body mass index [BMI] 40.0-44.9, adult    PRECAUTIONS: none  SUBJECTIVE: My knee (L)is causing me more pain than anything today.  Back is about normal"    PAIN:  Are you having pain? Yes NPRS scale: 6/10 Pain location: low back Pain orientation: Bilateral  PAIN TYPE: aching Pain description: constant  Aggravating factors: activity Relieving factors: rest, supine, sitting  Knee 8/10 current 10/31/21  OBJECTIVE:    DIAGNOSTIC FINDINGS:  FINDINGS: Frontal and lateral views obtained. There are pedicle screws from a posterior approach at L3, L4, and L5 with screw tips in the respective vertebral bodies. There is an apparent new disc spacer at L3-4 with an older disc spacer at L4-5 which appears unchanged in position. No evident fracture or spondylolisthesis. Moderate disc space narrowing again noted at L4-5.   IMPRESSION: Pedicle screws bilaterally at L3, L4, and L5 with screw tips in respective vertebral bodies. Disc spacers at L3-4 and L4-5. No fracture or spondylolisthesis. Moderate disc space narrowing again noted at L4-5.   PATIENT SURVEYS:  Modified Oswestry 25/50    SCREENING FOR RED FLAGS: Bowel or bladder incontinence: No   COGNITION:          Overall cognitive status: Within functional limits for tasks assessed                        SENSATION:          Light  touch: Appears intact   MUSCLE LENGTH: Hamstrings: Right 80 deg; Left 80 deg   POSTURE:  Slightly forward flexed and SB R   PALPATION: undetermined   LUMBARAROM/PROM   A/PROM A/PROM  10/17/2021  Flexion 33%  Extension 0%  Right lateral flexion    Left lateral flexion    Right rotation    Left rotation     (Blank rows = not tested)   LE AROM/PROM:   A/PROM Right 10/17/2021 Left 10/17/2021  Hip flexion 90d 90d  Hip extension      Hip abduction      Hip adduction      Hip internal rotation      Hip external rotation      Knee flexion 120d 120d  Knee extension 0d 0d  Ankle dorsiflexion      Ankle plantarflexion      Ankle inversion      Ankle eversion       (Blank rows = not tested)   LE MMT:   MMT Right 10/17/2021 Left 10/17/2021  Hip flexion 4 4  Hip extension 4 4  Hip abduction      Hip adduction      Hip internal rotation      Hip external rotation      Knee flexion 4 4  Knee extension 4 4  Ankle dorsiflexion      Ankle plantarflexion      Ankle inversion      Ankle eversion       (Blank rows = not tested)   LUMBAR SPECIAL TESTS:  None performed due to pain   FUNCTIONAL TESTS:  Deferred due to pain   GAIT: Distance walked: 75 Assistive device utilized: Single point cane Level of assistance: Modified independence Comments: antalgic gait with pain       TODAY'S TREATMENT   [facility] Adult PT Treatment:                                                DATE: 10/22/21 Therapeutic Exercise: Pt seen for aquatic therapy today.  Treatment took place in water 3.25-4.8 ft in depth at the [employer] [facility] pool. Temp of water was 96.  Pt entered/exited the pool via stairs step through pattern independently with bilat rail. Introduction to setting Walking 4.76ft forward, back  and side stepping 4 widths in 3 ft-4.68ft.  Pt edu on properties of water and benefits of aquatic therapy. Ice vs heat vs [employer].  Voltaren gel uses and purpose  Side lunges with blue  hand buoys 4 widths shoulder add/abd. Cues and demonstration for execution Kick board push/pull 2x25 in tandem stance R/L Marching x20 unsupported in 4.8 ft Plank 3rd step hip ext x10 cues for tightened glut at end range Step ups 2x10 R/L     Pt requires buoyancy for support and to offload joints with strengthening exercises. Viscosity of the water is needed for resistance of strengthening; water current perturbations provides challenge to standing balance unsupported, requiring increased core activation.    [facility] Adult PT Treatment:                                                DATE: 10/22/21 Therapeutic Exercise: Nustep L2 8 min PPT 3s x15 Curl ups x15 Supine marches x10 alt SKTC 30s x3 SL clams 15x B Open book 10x B Latissimus press w/inspiration 10x Lat press with LAQs alt 10x Lat press with marches alt 10x   10/17/21 Evaluation and HEP     PATIENT EDUCATION:  Education details: Discussed eval findings, rehab rationale and POC and patient is in agreement  Person educated: Patient Education method: Explanation and Handouts Education comprehension: verbalized understanding     HOME EXERCISE PROGRAM: Access Code: [email or web address] URL: [email or web address] Date: 10/17/2021 Prepared by: Sharlynn Oliphant Will add aquatics when approp.   Exercises Supine Posterior Pelvic Tilt - 2 x daily - 7 x weekly - 2 sets - 10 reps - 3s hold Curl Up with Arms Crossed - 2 x daily - 7 x weekly - 2 sets - 15 reps Supine March - 2 x daily - 7 x weekly - 2 sets - 10 reps Hooklying Single Knee to Chest Stretch with Towel - 2 x daily - 7 x weekly - 1 sets - 3 reps - 30s hold     ASSESSMENT:   CLINICAL IMPRESSION: Pt introduced to setting.  He demonstrates indep and safety in environment.  Time spent today answering questions in regards to modalities for pain and edema control.  He requires moderate cuing for execution of activities to focus on task/ms being used.  He is instructed on  abdominal bracing to improve posture and core strength.  He is a good candidate for aquatic therapy and will progress with core strength and flexibility and ensure indep with HEP as pt desires to continue with aquatic involvement with friends at the [facility]  Initiated aerobic work as warm up prior to exercises and stretching.  Has been consistent with HEP and has scheduled aquatic therapy visits.  Reviewed HEP for consistency, added additional stretches and core strengthening tasks   OBJECTIVE IMPAIRMENTS Abnormal gait, decreased activity tolerance, decreased mobility, difficulty walking, decreased ROM, decreased strength, postural dysfunction, and pain.    ACTIVITY LIMITATIONS community activity, driving, and yard work.    PERSONAL FACTORS Past/current experiences, Time since onset of injury/illness/exacerbation, and 1-2 comorbidities: cervical and lumbar fusions  are also affecting patient's functional outcome.      REHAB POTENTIAL: Fair based on limited treatment post surgeries   CLINICAL DECISION MAKING: Evolving/moderate complexity   EVALUATION COMPLEXITY: Moderate     GOALS: Goals reviewed with patient? Yes   SHORT TERM GOALS:  STG Name Target Date Goal status  1 Patient to demonstrate independence in HEP  Baseline: [email or web address] 11/14/2021 INITIAL  2 Initiate aquatic program Baseline: TBD 11/14/2021 INITIAL  3 Assess gait speed and set appropriate goal Baseline: TBD 11/14/2021 INITIAL    LONG TERM GOALS:    LTG Name Target Date Goal status  1 Increase lumbar ROM to 50% flexion and 25% extension Baseline: 33% FF and 0% extension 12/12/2021 INITIAL  2 Increase B hip/knee F/E strength to 4+/5 Baseline: B hip/knee extension strength 4/5 12/12/2021 INITIAL  3 Assess progress towards gait speed goal. Baseline: TBD 12/12/2021 INITIAL  4 Decrease ODI score to 40% Baseline: Initial score 50% 12/12/2021 INITIAL    PLAN: PT FREQUENCY: 1-2x/week   PT DURATION: 8 weeks   PLANNED  INTERVENTIONS: Therapeutic exercises, Therapeutic activity, Neuro Muscular re-education, Balance training, Gait training, Patient/Family education, Joint mobilization, Stair training, and Aquatic Therapy   PLAN FOR NEXT SESSION: Advance dry land sessions for core strength and flexibility with goal of HEP for long term management.     Latanya Hemmer Tharon Aquas) Dawt Reeb MPT 10/31/2021, 12:48 PM

## 2021-11-05 ENCOUNTER — Ambulatory Visit: Payer: Medicaid Other

## 2021-11-08 ENCOUNTER — Other Ambulatory Visit: Payer: Self-pay | Admitting: Gastroenterology

## 2021-11-13 ENCOUNTER — Other Ambulatory Visit: Payer: Self-pay

## 2021-11-13 ENCOUNTER — Encounter (HOSPITAL_BASED_OUTPATIENT_CLINIC_OR_DEPARTMENT_OTHER): Payer: Self-pay | Admitting: Physical Therapy

## 2021-11-13 ENCOUNTER — Ambulatory Visit (HOSPITAL_BASED_OUTPATIENT_CLINIC_OR_DEPARTMENT_OTHER): Payer: Medicaid Other | Admitting: Physical Therapy

## 2021-11-13 DIAGNOSIS — M6281 Muscle weakness (generalized): Secondary | ICD-10-CM | POA: Diagnosis not present

## 2021-11-13 NOTE — Therapy (Signed)
` ?OUTPATIENT PHYSICAL THERAPY TREATMENT NOTE ? ? ?Patient Name: [name] ?MRN: [id number] ?DOB:1961-05-23, 61 y.o., male ?Today's Date: 11/13/2021 ? ?PCP: [facility] [place] [place] ? ? PT End of Session - 11/13/21 0904   ? ? Visit Number 3   ? Number of Visits 8   ? Date for PT Re-Evaluation 11/14/21   ? PT Start Time [phone]   ? PT Stop Time 0949   ? PT Time Calculation (min) 39 min   ? ?  ?  ? ?  ? ? ?Past Medical History:  ?Diagnosis Date  ? Atrial fibrillation [facility])   ? s/p afib ablation  PVI 5/12 JA  ? CAD -nonobstructive   ? S/P nstemi-type II cath 05/2008 - nonobs. dzs.  ? Cataract   ? left eye 2019  ? Colon polyps   ? Complication of anesthesia   ? last 2 surgeries pt had some respiratory issues following surgery required ED visit, dx with bronchitis.   ? DDD (degenerative disc disease)   ? evaluate by neurosurgery is ongoing  ? Depression   ? ptsd  ? Duodenal adenoma   ? Erectile dysfunction   ? GERD (gastroesophageal reflux disease)   ? Headache(784.0)   ? Hemorrhoids   ? HTN (hypertension)   ? Hyperlipemia   ? Hyperthyroidism   ? Graves dz on methimazole and followed by [name]  ? Myocardial infarction [facility])   ? 2009  ? Obesity   ? Pancreatitis   ? Polysubstance abuse ([place])   ? cocaine, last used 1999  ? Presence of permanent cardiac pacemaker   ? since 2009  ? PUD (peptic ulcer disease)   ? gastritis due to ETOH previously  ? Pulmonary embolism ([place]) 04/2003  ? Hx of  ? Rectal bleeding   ? Sinus node dysfunction (HCC)   ? s/p MDT PPM - AAI - V port plugged  ? Sleep apnea   ? uses CPAP [facility] 2 YRS  ? Systolic and diastolic CHF, chronic ([place])   ? EF 35% by most recent echo 09/19/10  ? 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  ? recent ly had tooth pulled painful  ? ?Past Surgical History:  ?Procedure Laterality Date  ? ANTERIOR CERVICAL DECOMP/DISCECTOMY FUSION  12/15/2011  ? Procedure: ANTERIOR CERVICAL DECOMPRESSION/DISCECTOMY FUSION 1 LEVEL;  Surgeon: [name], MD;   Location: [place] [facility];  Service: Neurosurgery;  Laterality: N/A;  Cervical Six-Seven Anterior Cervical Diskectomy fusion with Allograft and Plating  ? ANTERIOR CERVICAL DECOMP/DISCECTOMY FUSION N/A 01/01/2020  ? Procedure: Anterior Cervical Discectomy Fusion - Cervical Seven- Thoracic One;  Surgeon: Earnie Larsson, MD;  Location: [place];  Service: Neurosurgery;  Laterality: N/A;  Anterior Cervical Discectomy Fusion - Cervical Seven- Thoracic One  ? ATRIAL ABLATION SURGERY  12/2010  ? afib ablation by [name]  ? ATRIAL FIBRILLATION ABLATION N/A 05/06/2017  ? Procedure: Atrial Fibrillation Ablation;  Surgeon: Thompson Grayer, MD;  Location: [place] CV LAB;  Service: Cardiovascular;  Laterality: N/A;  ? BACK SURGERY    ? CARDIAC CATHETERIZATION    ? CADIAC ABLATION [name] 12/2010  ? CARDIAC CATHETERIZATION N/A 05/08/2016  ? Procedure: Left Heart Cath and Coronary Angiography;  Surgeon: Peter M Martinique, MD;  Location: [place] CV LAB;  Service: Cardiovascular;  Laterality: N/A;  ? Fingers removed from right hand.  2/84  ? traumatic work injury  ? INSERT / REPLACE / REMOVE PACEMAKER    ? 05/2008    ?  LUMBAR SPINE SURGERY  02/2009  ? Dr. Trenton Gammon  ? PACEMAKER INSERTION  10/09  ? by Dr [name]  ? TEE WITHOUT CARDIOVERSION N/A 05/05/2017  ? Procedure: TRANSESOPHAGEAL ECHOCARDIOGRAM (TEE);  Surgeon: Larey Dresser, MD;  Location[facility];  Service: Cardiovascular;  Laterality: N/A;  ? ?Patient Active Problem List  ? Diagnosis Date Noted  ? Upper airway cough syndrome 10/30/2021  ? Degenerative spondylolisthesis 10/07/2020  ? Cervical spondylosis with myelopathy and radiculopathy 01/01/2020  ? Acute maxillary sinusitis 09/12/2018  ? Paroxysmal atrial fibrillation ([place]) 05/06/2017  ? Acute on chronic diastolic congestive heart failure, NYHA class 4 ([place]) 04/03/2017  ? Back pain 11/12/2015  ? Scrotal varicose veins 07/11/2015  ? Varicocele 07/11/2015  ? High risk medication use - Tikosyn 09/16/2014  ? Chronic  anticoagulation-Pradaxa 09/13/2014  ? Muscle twitch 09/11/2014  ? Leg cramps 09/11/2014  ? Irritant contact dermatitis 10/03/2013  ? 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 Hypogonadism male 10/03/2013  ? Mixed, or nondependent drug abuse, in remission ([place]) 09/13/2013  ? Substance abuse in remission ([place]) 09/13/2013  ? Spinal stenosis, lumbar region, with neurogenic claudication 05/23/2013  ? CAD -nonobstructive   ? Preoperative cardiovascular examination 11/17/2012  ? Pulmonary embolism ([place]) 09/02/2012  ? Acid reflux 09/02/2012  ? Sinus node dysfunction ([place]) 09/02/2012  ? Adiposity 02/12/2012  ? Obesity 02/12/2012  ? Cervical disc herniation 12/15/2011  ? Allergic rhinitis 09/21/2011  ? Atrial fibrillation with RVR ([place]) 07/08/2011  ? Discomfort 06/19/2011  ? Male erectile dysfunction 06/19/2011  ? Lichen planus 62/13/0865  ? Decreased libido 06/19/2011  ? Testicular discomfort 06/19/2011  ? Essential hypertension 04/14/2011  ? Dermatitis, eczematoid 02/26/2011  ? Eczema 02/26/2011  ? 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  ? Degeneration of intervertebral disc of lumbosacral region 01/06/2011  ? Chronic atrial fibrillation ([place]) 01/06/2011  ? DDD (degenerative disc disease), lumbosacral 01/06/2011  ? Graves disease 10/30/2010  ? CARPAL TUNNEL SYNDROME, LEFT 10/16/2010  ? SICK SINUS/ TACHY-BRADY SYNDROME 10/16/2010  ? Ischemic cardiomyopathy 09/21/2010  ? HLD (hyperlipidemia) 09/16/2010  ? PPM-Medtronic- 2009 06/27/2010  ? DYSPNEA 06/06/2010  ? Abnormal cardiac CT angiography 06/06/2010  ? Seasonal and perennial allergic rhinitis 02/05/2010  ? OTHER CHEST PAIN 11/27/2009  ? ERECTILE DYSFUNCTION, ORGANIC 07/18/2009  ? OSA (obstructive sleep apnea) 07/08/2009  ? COMBINED HEART FAILURE, CHRONIC 05/30/2009  ? LUMBAR RADICULOPATHY, RIGHT 11/05/2008  ? HIATAL HERNIA 07/04/2008  ? HYPERTHYROIDISM 08/30/2007  ? Dyslipidemia 08/30/2007  ? ALCOHOL ABUSE 08/30/2007  ? DEPRESSION 08/30/2007  ? PAF (paroxysmal atrial fibrillation) ([place]) 08/30/2007   ? GERD (gastroesophageal reflux disease) 08/30/2007  ? PULMONARY EMBOLISM, HX OF 08/30/2007  ? DRUG ABUSE, HX OF 08/30/2007  ? ? ?REFERRING DIAG: Muscle weakness (generalized) ? ?Lumbar stenosis without neurogenic claudication ? ?THERAPY DIAG:  ?Muscle weakness (generalized) ? ?Lumbar stenosis without neurogenic claudication ? ?PERTINENT HISTORY: Uncomplicated opioid dependence ?Chronic pain disorder ?Essential (primary) hypertension ?Body mass index [BMI] 40.0-44.9, adult ?  ? ?PRECAUTIONS: none ? ?SUBJECTIVE: "its gotten so bad (knee) I have started wearing my knee brace some" "I felt really good for a few days after last session where my pain was almost gone everywhere" ? ? ? ?PAIN:  ?Are you having pain? Yes ?NPRS scale: 6/10 ?Pain location: low back ?Pain orientation: Bilateral  ?PAIN TYPE: aching ?Pain description: constant  ?Aggravating factors: activity ?Relieving factors: rest, supine, sitting ? ?Knee 8/10 current 11/13/21 ? ? ? ? ?  ?OBJECTIVE:  ?  ?DIAGNOSTIC FINDINGS:  ?FINDINGS: ?Frontal and lateral views obtained. There are pedicle  screws from a ?posterior approach at L3, L4, and L5 with screw tips in the ?respective vertebral bodies. There is an apparent new disc spacer at ?L3-4 with an older disc spacer at L4-5 which appears unchanged in ?position. No evident fracture or spondylolisthesis. Moderate disc ?space narrowing again noted at L4-5. ?  ?IMPRESSION: ?Pedicle screws bilaterally at L3, L4, and L5 with screw tips in ?respective vertebral bodies. Disc spacers at L3-4 and L4-5. No ?fracture or spondylolisthesis. Moderate disc space narrowing again ?noted at L4-5. ?  ?PATIENT SURVEYS:  ?Modified Oswestry 25/50  ?  ?SCREENING FOR RED FLAGS: ?Bowel or bladder incontinence: No ?  ?COGNITION: ?         Overall cognitive status: Within functional limits for tasks assessed              ?          ?SENSATION: ?         Light touch: Appears intact ?  ?MUSCLE LENGTH: ?Hamstrings: Right 80 deg; Left 80 deg ?   ?POSTURE:  ?Slightly forward flexed and SB R ?  ?PALPATION: ?undetermined ?  ?LUMBARAROM/PROM ?  ?A/PROM A/PROM  ?10/17/2021  ?Flexion 33%  ?Extension 0%  ?Right lateral flexion    ?Left lateral flexion    ?Right rotation

## 2021-11-19 ENCOUNTER — Ambulatory Visit: Payer: Medicaid Other | Attending: Neurosurgery

## 2021-11-19 ENCOUNTER — Other Ambulatory Visit: Payer: Self-pay

## 2021-11-19 DIAGNOSIS — M6281 Muscle weakness (generalized): Secondary | ICD-10-CM

## 2021-11-19 DIAGNOSIS — M48061 Spinal stenosis, lumbar region without neurogenic claudication: Secondary | ICD-10-CM

## 2021-11-19 NOTE — Therapy (Addendum)
` ?OUTPATIENT PHYSICAL THERAPY TREATMENT NOTE/DC SUMMARY ? ? ?Patient Name: [name] ?MRN: [id number] ?DOB:04-30-1961, 61 y.o., male ?Today's Date: 11/19/2021 ? ?PCP: [facility] ?REFERRING PROVIDER: Earnie Larsson, MD ?PHYSICAL THERAPY DISCHARGE SUMMARY ? ?Visits from Start of Care: 5 ? ?Current functional level related to goals / functional outcomes: ?Goals partially met ?  ?Remaining deficits: ?Pain, mobility restrictions ?  ?Education / Equipment: ?HEP  ? ?Patient agrees to discharge. Patient goals were partially met. Patient is being discharged due to not returning since the last visit.  ? PT End of Session - 11/19/21 1055   ? ? Visit Number 5   ? Number of Visits 8   ? Date for PT Re-Evaluation 11/14/21   ? Authorization Type MCD   ? Progress Note Due on Visit 8   ? PT Start Time 1050   ? PT Stop Time 1130   ? PT Time Calculation (min) 40 min   ? Activity Tolerance Patient tolerated treatment well   ? Behavior During Therapy [facility] for tasks assessed/performed   ? ?  ?  ? ?  ? ? ?Past Medical History:  ?Diagnosis Date  ? Atrial fibrillation [facility])   ? s/p afib ablation  PVI 5/12 JA  ? CAD -nonobstructive   ? S/P nstemi-type II cath 05/2008 - nonobs. dzs.  ? Cataract   ? left eye 2019  ? Colon polyps   ? Complication of anesthesia   ? last 2 surgeries pt had some respiratory issues following surgery required ED visit, dx with bronchitis.   ? DDD (degenerative disc disease)   ? evaluate by neurosurgery is ongoing  ? Depression   ? ptsd  ? Duodenal adenoma   ? Erectile dysfunction   ? GERD (gastroesophageal reflux disease)   ? Headache(784.0)   ? Hemorrhoids   ? HTN (hypertension)   ? Hyperlipemia   ? Hyperthyroidism   ? Graves dz on methimazole and followed by [name]  ? Myocardial infarction [facility])   ? 2009  ? Obesity   ? Pancreatitis   ? Polysubstance abuse ([place])   ? cocaine, last used 1999  ? Presence of permanent cardiac pacemaker   ? since 2009  ? PUD (peptic ulcer disease)   ? gastritis due  to ETOH previously  ? Pulmonary embolism ([place]) 04/2003  ? Hx of  ? Rectal bleeding   ? Sinus node dysfunction (HCC)   ? s/p MDT PPM - AAI - V port plugged  ? Sleep apnea   ? uses CPAP [facility] 2 YRS  ? Systolic and diastolic CHF, chronic ([place])   ? EF 35% by most recent echo 09/19/10  ? 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  ? recent ly had tooth pulled painful  ? ?Past Surgical History:  ?Procedure Laterality Date  ? ANTERIOR CERVICAL DECOMP/DISCECTOMY FUSION  12/15/2011  ? Procedure: ANTERIOR CERVICAL DECOMPRESSION/DISCECTOMY FUSION 1 LEVEL;  Surgeon: [name], MD;  Location: [place] NEURO ORS;  Service: Neurosurgery;  Laterality: N/A;  Cervical Six-Seven Anterior Cervical Diskectomy fusion with Allograft and Plating  ? ANTERIOR CERVICAL DECOMP/DISCECTOMY FUSION N/A 01/01/2020  ? Procedure: Anterior Cervical Discectomy Fusion - Cervical Seven- Thoracic One;  Surgeon: Earnie Larsson, MD;  Location: [place];  Service: Neurosurgery;  Laterality: N/A;  Anterior Cervical Discectomy Fusion - Cervical Seven- Thoracic One  ? ATRIAL ABLATION SURGERY  12/2010  ? afib ablation by [name]  ? ATRIAL FIBRILLATION ABLATION N/A 05/06/2017  ? Procedure: Atrial Fibrillation Ablation;  Surgeon:  Thompson Grayer, MD;  Location: [place] CV LAB;  Service: Cardiovascular;  Laterality: N/A;  ? BACK SURGERY    ? CARDIAC CATHETERIZATION    ? CADIAC ABLATION [name] 12/2010  ? CARDIAC CATHETERIZATION N/A 05/08/2016  ? Procedure: Left Heart Cath and Coronary Angiography;  Surgeon: Peter M Martinique, MD;  Location: [place] CV LAB;  Service: Cardiovascular;  Laterality: N/A;  ? Fingers removed from right hand.  2/84  ? traumatic work injury  ? INSERT / REPLACE / REMOVE PACEMAKER    ? 05/2008    ? LUMBAR SPINE SURGERY  02/2009  ? Dr. Trenton Gammon  ? PACEMAKER INSERTION  10/09  ? by Dr [name]  ? TEE WITHOUT CARDIOVERSION N/A 05/05/2017  ? Procedure: TRANSESOPHAGEAL ECHOCARDIOGRAM (TEE);  Surgeon: Larey Dresser, MD;  Location: [facility] ENDOSCOPY;  Service: Cardiovascular;  Laterality:  N/A;  ? ?Patient Active Problem List  ? Diagnosis Date Noted  ? Upper airway cough syndrome 10/30/2021  ? Degenerative spondylolisthesis 10/07/2020  ? Cervical spondylosis with myelopathy and radiculopathy 01/01/2020  ? Acute maxillary sinusitis 09/12/2018  ? Paroxysmal atrial fibrillation ([place]) 05/06/2017  ? Acute on chronic diastolic congestive heart failure, NYHA class 4 ([place]) 04/03/2017  ? Back pain 11/12/2015  ? Scrotal varicose veins 07/11/2015  ? Varicocele 07/11/2015  ? High risk medication use - Tikosyn 09/16/2014  ? Chronic anticoagulation-Pradaxa 09/13/2014  ? Muscle twitch 09/11/2014  ? Leg cramps 09/11/2014  ? Irritant contact dermatitis 10/03/2013  ? 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 Hypogonadism male 10/03/2013  ? Mixed, or nondependent drug abuse, in remission ([place]) 09/13/2013  ? Substance abuse in remission ([place]) 09/13/2013  ? Spinal stenosis, lumbar region, with neurogenic claudication 05/23/2013  ? CAD -nonobstructive   ? Preoperative cardiovascular examination 11/17/2012  ? Pulmonary embolism ([place]) 09/02/2012  ? Acid reflux 09/02/2012  ? Sinus node dysfunction ([place]) 09/02/2012  ? Adiposity 02/12/2012  ? Obesity 02/12/2012  ? Cervical disc herniation 12/15/2011  ? Allergic rhinitis 09/21/2011  ? Atrial fibrillation with RVR ([place]) 07/08/2011  ? Discomfort 06/19/2011  ? Male erectile dysfunction 06/19/2011  ? Lichen planus 72/05/4708  ? Decreased libido 06/19/2011  ? Testicular discomfort 06/19/2011  ? Essential hypertension 04/14/2011  ? Dermatitis, eczematoid 02/26/2011  ? Eczema 02/26/2011  ? 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  ? Degeneration of intervertebral disc of lumbosacral region 01/06/2011  ? Chronic atrial fibrillation ([place]) 01/06/2011  ? DDD (degenerative disc disease), lumbosacral 01/06/2011  ? Graves disease 10/30/2010  ? CARPAL TUNNEL SYNDROME, LEFT 10/16/2010  ? SICK SINUS/ TACHY-BRADY SYNDROME 10/16/2010  ? Ischemic cardiomyopathy 09/21/2010  ? HLD (hyperlipidemia) 09/16/2010  ? PPM-Medtronic-  2009 06/27/2010  ? DYSPNEA 06/06/2010  ? Abnormal cardiac CT angiography 06/06/2010  ? Seasonal and perennial allergic rhinitis 02/05/2010  ? OTHER CHEST PAIN 11/27/2009  ? ERECTILE DYSFUNCTION, ORGANIC 07/18/2009  ? OSA (obstructive sleep apnea) 07/08/2009  ? COMBINED HEART FAILURE, CHRONIC 05/30/2009  ? LUMBAR RADICULOPATHY, RIGHT 11/05/2008  ? HIATAL HERNIA 07/04/2008  ? HYPERTHYROIDISM 08/30/2007  ? Dyslipidemia 08/30/2007  ? ALCOHOL ABUSE 08/30/2007  ? DEPRESSION 08/30/2007  ? PAF (paroxysmal atrial fibrillation) ([place]) 08/30/2007  ? GERD (gastroesophageal reflux disease) 08/30/2007  ? PULMONARY EMBOLISM, HX OF 08/30/2007  ? DRUG ABUSE, HX OF 08/30/2007  ? ? ?REFERRING DIAG: Muscle weakness (generalized) ? ?Lumbar stenosis without neurogenic claudication ? ?THERAPY DIAG:  ?Muscle weakness (generalized) ? ?Lumbar stenosis without neurogenic claudication ? ?PERTINENT HISTORY: Uncomplicated opioid dependence ?Chronic pain disorder ?Essential (primary) hypertension ?Body mass index [BMI] 40.0-44.9, adult ?  ? ?  PRECAUTIONS: none ? ?SUBJECTIVE: Low back pain is minimal but having more more pain and dysfunction with shoulders and knees, with f/u with ortho MD tomorrow ? ? ? ?PAIN:  ?Are you having pain? Yes ?NPRS scale: 6/10 ?Pain location: knees and shoulders ?Pain orientation: Bilateral  ?PAIN TYPE: aching ?Pain description: constant  ?Aggravating factors: activity ?Relieving factors: rest, supine, sitting ?  ?OBJECTIVE:  ?  ?DIAGNOSTIC FINDINGS:  ?FINDINGS: ?Frontal and lateral views obtained. There are pedicle screws from a ?posterior approach at L3, L4, and L5 with screw tips in the ?respective vertebral bodies. There is an apparent new disc spacer at ?L3-4 with an older disc spacer at L4-5 which appears unchanged in ?position. No evident fracture or spondylolisthesis. Moderate disc ?space narrowing again noted at L4-5. ?  ?IMPRESSION: ?Pedicle screws bilaterally at L3, L4, and L5 with screw tips in ?respective  vertebral bodies. Disc spacers at L3-4 and L4-5. No ?fracture or spondylolisthesis. Moderate disc space narrowing again ?noted at L4-5. ?  ?PATIENT SURVEYS:  ?Modified Oswestry 25/50  ?  ?SCREENING FOR RED

## 2021-11-26 ENCOUNTER — Telehealth: Payer: Self-pay | Admitting: Nurse Practitioner

## 2021-11-26 ENCOUNTER — Ambulatory Visit: Payer: Medicaid Other

## 2021-11-26 NOTE — Telephone Encounter (Signed)
Called and spoke with patient, he states that he has the same cough, sinuses draining mucous causing his throat to be sore.  He denies any fever, chills or body aches.  He states he has had this problem for a year and it is not a new problem.  He has seen his ENT and has been told that it is caused by his CPAP machine.  I offered the patient an appointment with Dr. Annamaria Boots for 11/27/21 at 10:30 am, advised to arrive by 10:15 am.  He verbalized understanding.  Nothing further needed. ?

## 2021-11-26 NOTE — Telephone Encounter (Addendum)
ATC patient--no answer with no option to leave vm. Line rang for >1min.  °

## 2021-11-27 ENCOUNTER — Encounter: Payer: Self-pay | Admitting: Internal Medicine

## 2021-11-27 ENCOUNTER — Ambulatory Visit (INDEPENDENT_AMBULATORY_CARE_PROVIDER_SITE_OTHER): Payer: Medicaid Other | Admitting: Internal Medicine

## 2021-11-27 ENCOUNTER — Ambulatory Visit (INDEPENDENT_AMBULATORY_CARE_PROVIDER_SITE_OTHER): Payer: Medicaid Other

## 2021-11-27 VITALS — BP 120/90 | HR 52 | Temp 98.1°F | Ht 71.0 in | Wt 292.2 lb

## 2021-11-27 DIAGNOSIS — G4733 Obstructive sleep apnea (adult) (pediatric): Secondary | ICD-10-CM | POA: Diagnosis not present

## 2021-11-27 DIAGNOSIS — R058 Other specified cough: Secondary | ICD-10-CM

## 2021-11-27 DIAGNOSIS — J302 Other seasonal allergic rhinitis: Secondary | ICD-10-CM

## 2021-11-27 DIAGNOSIS — J3089 Other allergic rhinitis: Secondary | ICD-10-CM | POA: Diagnosis not present

## 2021-11-27 MED ORDER — SPIRIVA RESPIMAT 2.5 MCG/ACT IN AERS: 2.0000 | INHALATION_SPRAY | Freq: Every day | RESPIRATORY_TRACT | 0 refills | Status: DC

## 2021-11-27 NOTE — Patient Instructions (Signed)
Order- referral to [facility]    consider [facility] for OSA ? ?Order- schedule PFT    dx former smoker, chronic cough ? ?Order- CXR    dx chronic cough ? ?Order sample x 2 Spiriva 2.5     inhale 21 puffs daily   See if this helps the cough ? ?Order- DME [facility]/ [facility]- continue CCPAP auto 5-15, replace mask of choice, filter/ hose/ supplies and continue AirView/ card ? ?Keep your appointment for mask fitting aat sleep center ? ? ?

## 2021-11-27 NOTE — Progress Notes (Signed)
HPI   ?male former smoker followed for OSA, complicated by CAD/CHF/P A. fib/pacemaker/Pradaxa, allergic rhinitis, hyperthyroid,  degenerative disc disease, GERD ?NPSG 2010- AHI 21/ hr ? ?--------------------------------------------------------------------------------------- ? ? ?04/25/21- 61 year old male former smoker followed for OSA, complicated by CAD/CHF/P A. fib/Pacemaker/Eliquis, allergic rhinitis, hyperthyroid, degenerative disc disease, GERD, Hx PE 2004 ?CPAP auto 5-15/[facility]/ replaced in July ?Download- ?Body weight today- ?Covid vax-3 Moderna ?  He called 8/21 c/o cough x 3 months> takes off CPAP. Blames PND. Chest tight x 1 week. We sent ZPAk 8/19> declined by drug store due to drug interactions. > resent by Dr Vaughan Browner 8/19.  ?Then augmentin on 7/12. ?-----Follow up OSA.  ?Complains of cough and sense of drainage in throat. No dificulty swallowing or pain. Sputum green in morning. He didn't try either Zpak or augmentin we sent.in July.No sinus pain. ?No fever or ankle edema. Blames Mucinex DM for raising BP- may have  been Muccinex-D. ?Seen today by cardiology with CBC and BMET drawn. ? ?05/21/21- 61 year old male former smoker followed for OSA, complicated by CAD/CHF/P A. fib/Pacemaker/Eliquis, allergic rhinitis, hyperthyroid, degenerative disc disease, GERD, Hx PE 2004 ?CPAP auto 5-15/[facility]/ replaced in July ?Download-compliance 98%, AHI 1.8/ hr. ?Body weight today-285 lbs ?Covid vax-3 Moderna ?He describes persistent sinus pressure and post-nasal drip. He is concerned that CPAP is causing this. Has ENT appt next week with Dr Benjamine Mola. ?Meanwhile has continued CPAP with good compliance and control. Download reviewed. ?We had dicussed alternatives to CPAP. He has an appt with Dr Ron Parker Orthodontist to discuss oral appliances in early October.  ?He is too heavy currently for Inspire and has a cardiac pacemaker. We can send him to look into this later if he chooses.  ?CXR- 04/25/21- ?IMPRESSION: ?Unchanged  appearance of the chest x-ray, with no definite evidence ?of acute cardiopulmonary disease. ? ?11/27/21- 61 year old male former smoker followed for OSA, complicated by CAD/CHF/P A. fib/Pacemaker/Eliquis, allergic Rhinitis, hyperthyroid, degenerative disc disease, GERD, Hx PE 2004 ?CPAP auto 5-15/[facility]/ replaced in July 2022 ?Download-compliance - no download available ?Body weight today-292 lbs ?Covid vax-2 Phizer ?Flu vax- no ?Wakes with nasal congestion. Saw 2 [facility] ENTs who blamed CPAP and suggested Inspire. He is pending mask fitting. Insurance wouldn't cover OAP. ?Girlfriend tells him he wheezes in sleep. Reports cough beige sputum- makes throat sore. Trying Nyquil, Mucinex, Delsym, Flonase.  ? ?ROS-see HPI   + = positive ?Constitutional:    weight loss, night sweats, fevers, chills, fatigue, lassitude. ?HEENT:    headaches, difficulty swallowing, tooth/dental problems, sore throat,  ?     sneezing, itching, ear ache, +nasal congestion, +post nasal drip, snoring ?CV:    +chest pain, orthopnea, PND, swelling in lower extremities, anasarca,                                  ?                  dizziness, palpitations ?Resp:   +shortness of breath with exertion or at rest.   ?             +productive cough,   non-productive cough, coughing up of blood.   ?           +change in color of mucus.  wheezing.   ?Skin:    rash or lesions. ?GI:  No-   heartburn, indigestion, abdominal pain, nausea, vomiting ?GU: . ?MS:   joint pain, stiffness,  ?Neuro-  nothing unusual ?Psych:  change in mood or affect.  depression or anxiety.   memory loss. ? ?OBJ- Physical Exam ?General- Alert, Oriented, Affect-appropriate, Distress- none acute, + obese ?Skin- rash-none, lesions- none, excoriation- none ?Lymphadenopathy- none ?Head- atraumatic ?           Eyes- Gross vision intact, PERRLA, conjunctivae and secretions clear ?           Ears- Hearing, canals-normal ?           Nose- , + turbinate edema R, no-Septal dev, mucus, polyps,  erosion, perforation  ?           Throat- Mallampati III-IV , mucosa clear/not dry , drainage- none seen, tonsils- atrophic, Not hoarse,  ?Neck- flexible , trachea midline, no stridor , thyroid nl, carotid no bruit ?Chest - symmetrical excursion , unlabored ?          Heart/CV- RRR , no murmur , no gallop  , no rub, nl s1 s2 ?                          - JVD- none , edema- none, stasis changes- none, varices- none ?          Lung- clear to P&A, wheeze- none, cough- none , dullness-none, rub- none ?          Chest wall-+ L pacemaker ?Abd-  ?Br/ Gen/ Rectal- Not done, not indicated ?Extrem- cyanosis- none, clubbing, none, atrophy- none, strength- nl. +Missing distal fingers R hand. ?Neuro- grossly intact to observation ? ? ?   ? ?

## 2021-11-28 ENCOUNTER — Other Ambulatory Visit: Payer: Self-pay | Admitting: Neurosurgery

## 2021-11-28 DIAGNOSIS — M4802 Spinal stenosis, cervical region: Secondary | ICD-10-CM

## 2021-11-28 DIAGNOSIS — M5414 Radiculopathy, thoracic region: Secondary | ICD-10-CM

## 2021-12-02 NOTE — Progress Notes (Addendum)
Spoke with the pt and notified of results. He states that he has still been coughing with green sputum every day. He states that he tried the spiriva a couple of times and this caused him to have CP so he stopped.  ? ? ?

## 2021-12-03 ENCOUNTER — Ambulatory Visit (HOSPITAL_BASED_OUTPATIENT_CLINIC_OR_DEPARTMENT_OTHER): Payer: Medicaid Other | Attending: Nurse Practitioner | Admitting: Internal Medicine

## 2021-12-03 ENCOUNTER — Ambulatory Visit: Payer: Medicaid Other

## 2021-12-03 DIAGNOSIS — G4733 Obstructive sleep apnea (adult) (pediatric): Secondary | ICD-10-CM

## 2021-12-08 ENCOUNTER — Ambulatory Visit: Payer: Medicaid Other | Attending: Neurosurgery

## 2021-12-08 NOTE — Progress Notes (Signed)
Tried calling pt and there was no answer and no option given to leave msg. Will forward to [facility] to f/u later on this. Thanks.

## 2021-12-08 NOTE — Therapy (Incomplete)
` ?OUTPATIENT PHYSICAL THERAPY TREATMENT NOTE ? ? ?Patient Name: [name] ?MRN: [id number] ?DOB:12/23/1960, 61 y.o., male ?Today's Date: 12/08/2021 ? ?PCP: [facility] ?REFERRING PROVIDER: Earnie Larsson, MD ? ? ? ? ?Past Medical History:  ?Diagnosis Date  ? Atrial fibrillation [facility])   ? s/p afib ablation  PVI 5/12 JA  ? CAD -nonobstructive   ? S/P nstemi-type II cath 05/2008 - nonobs. dzs.  ? Cataract   ? left eye 2019  ? Colon polyps   ? Complication of anesthesia   ? last 2 surgeries pt had some respiratory issues following surgery required ED visit, dx with bronchitis.   ? DDD (degenerative disc disease)   ? evaluate by neurosurgery is ongoing  ? Depression   ? ptsd  ? Duodenal adenoma   ? Erectile dysfunction   ? GERD (gastroesophageal reflux disease)   ? Headache(784.0)   ? Hemorrhoids   ? HTN (hypertension)   ? Hyperlipemia   ? Hyperthyroidism   ? Graves dz on methimazole and followed by [name]  ? Myocardial infarction [facility])   ? 2009  ? Obesity   ? Pancreatitis   ? Polysubstance abuse ([place])   ? cocaine, last used 1999  ? Presence of permanent cardiac pacemaker   ? since 2009  ? PUD (peptic ulcer disease)   ? gastritis due to ETOH previously  ? Pulmonary embolism ([place]) 04/2003  ? Hx of  ? Rectal bleeding   ? Sinus node dysfunction (HCC)   ? s/p MDT PPM - AAI - V port plugged  ? Sleep apnea   ? uses CPAP [facility] 2 YRS  ? Systolic and diastolic CHF, chronic ([place])   ? EF 35% by most recent echo 09/19/10  ? 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  ? recent ly had tooth pulled painful  ? ?Past Surgical History:  ?Procedure Laterality Date  ? ANTERIOR CERVICAL DECOMP/DISCECTOMY FUSION  12/15/2011  ? Procedure: ANTERIOR CERVICAL DECOMPRESSION/DISCECTOMY FUSION 1 LEVEL;  Surgeon: [name], MD;  Location: [place] NEURO ORS;  Service: Neurosurgery;  Laterality: N/A;  Cervical Six-Seven Anterior Cervical Diskectomy fusion with Allograft and Plating  ? ANTERIOR CERVICAL DECOMP/DISCECTOMY FUSION N/A 01/01/2020  ? Procedure:  Anterior Cervical Discectomy Fusion - Cervical Seven- Thoracic One;  Surgeon: Earnie Larsson, MD;  Location: [place];  Service: Neurosurgery;  Laterality: N/A;  Anterior Cervical Discectomy Fusion - Cervical Seven- Thoracic One  ? ATRIAL ABLATION SURGERY  12/2010  ? afib ablation by [name]  ? ATRIAL FIBRILLATION ABLATION N/A 05/06/2017  ? Procedure: Atrial Fibrillation Ablation;  Surgeon: Thompson Grayer, MD;  Location: [place] CV LAB;  Service: Cardiovascular;  Laterality: N/A;  ? BACK SURGERY    ? CARDIAC CATHETERIZATION    ? CADIAC ABLATION [name] 12/2010  ? CARDIAC CATHETERIZATION N/A 05/08/2016  ? Procedure: Left Heart Cath and Coronary Angiography;  Surgeon: Peter M Martinique, MD;  Location: [place] CV LAB;  Service: Cardiovascular;  Laterality: N/A;  ? Fingers removed from right hand.  2/84  ? traumatic work injury  ? INSERT / REPLACE / REMOVE PACEMAKER    ? 05/2008    ? LUMBAR SPINE SURGERY  02/2009  ? Dr. Trenton Gammon  ? PACEMAKER INSERTION  10/09  ? by Dr [name]  ? TEE WITHOUT CARDIOVERSION N/A 05/05/2017  ? Procedure: TRANSESOPHAGEAL ECHOCARDIOGRAM (TEE);  Surgeon: Larey Dresser, MD;  Location: [facility] ENDOSCOPY;  Service: Cardiovascular;  Laterality: N/A;  ? ?Patient Active Problem List  ? Diagnosis Date  Noted  ? Upper airway cough syndrome 10/30/2021  ? Degenerative spondylolisthesis 10/07/2020  ? Cervical spondylosis with myelopathy and radiculopathy 01/01/2020  ? Acute maxillary sinusitis 09/12/2018  ? Paroxysmal atrial fibrillation ([place]) 05/06/2017  ? Acute on chronic diastolic congestive heart failure, NYHA class 4 ([place]) 04/03/2017  ? Back pain 11/12/2015  ? Scrotal varicose veins 07/11/2015  ? Varicocele 07/11/2015  ? High risk medication use - Tikosyn 09/16/2014  ? Chronic anticoagulation-Pradaxa 09/13/2014  ? Muscle twitch 09/11/2014  ? Leg cramps 09/11/2014  ? Irritant contact dermatitis 10/03/2013  ? 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 Hypogonadism male 10/03/2013  ? Mixed, or nondependent drug abuse, in remission  ([place]) 09/13/2013  ? Substance abuse in remission ([place]) 09/13/2013  ? Spinal stenosis, lumbar region, with neurogenic claudication 05/23/2013  ? CAD -nonobstructive   ? Preoperative cardiovascular examination 11/17/2012  ? Pulmonary embolism ([place]) 09/02/2012  ? Acid reflux 09/02/2012  ? Sinus node dysfunction ([place]) 09/02/2012  ? Adiposity 02/12/2012  ? Obesity 02/12/2012  ? Cervical disc herniation 12/15/2011  ? Allergic rhinitis 09/21/2011  ? Atrial fibrillation with RVR ([place]) 07/08/2011  ? Discomfort 06/19/2011  ? Male erectile dysfunction 06/19/2011  ? Lichen planus 15/40/0867  ? Decreased libido 06/19/2011  ? Testicular discomfort 06/19/2011  ? Essential hypertension 04/14/2011  ? Dermatitis, eczematoid 02/26/2011  ? Eczema 02/26/2011  ? 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  ? Degeneration of intervertebral disc of lumbosacral region 01/06/2011  ? Chronic atrial fibrillation ([place]) 01/06/2011  ? DDD (degenerative disc disease), lumbosacral 01/06/2011  ? Graves disease 10/30/2010  ? CARPAL TUNNEL SYNDROME, LEFT 10/16/2010  ? SICK SINUS/ TACHY-BRADY SYNDROME 10/16/2010  ? Ischemic cardiomyopathy 09/21/2010  ? HLD (hyperlipidemia) 09/16/2010  ? PPM-Medtronic- 2009 06/27/2010  ? DYSPNEA 06/06/2010  ? Abnormal cardiac CT angiography 06/06/2010  ? Seasonal and perennial allergic rhinitis 02/05/2010  ? OTHER CHEST PAIN 11/27/2009  ? ERECTILE DYSFUNCTION, ORGANIC 07/18/2009  ? OSA (obstructive sleep apnea) 07/08/2009  ? COMBINED HEART FAILURE, CHRONIC 05/30/2009  ? LUMBAR RADICULOPATHY, RIGHT 11/05/2008  ? HIATAL HERNIA 07/04/2008  ? HYPERTHYROIDISM 08/30/2007  ? Dyslipidemia 08/30/2007  ? ALCOHOL ABUSE 08/30/2007  ? DEPRESSION 08/30/2007  ? PAF (paroxysmal atrial fibrillation) ([place]) 08/30/2007  ? GERD (gastroesophageal reflux disease) 08/30/2007  ? PULMONARY EMBOLISM, HX OF 08/30/2007  ? DRUG ABUSE, HX OF 08/30/2007  ? ? ?REFERRING DIAG: Muscle weakness (generalized) ? ?Lumbar stenosis without neurogenic claudication ? ?THERAPY  DIAG:  ?No diagnosis found. ? ?PERTINENT HISTORY: Uncomplicated opioid dependence ?Chronic pain disorder ?Essential (primary) hypertension ?Body mass index [BMI] 40.0-44.9, adult ?  ? ?PRECAUTIONS: none ? ?SUBJECTIVE: Low back pain is minimal but having more more pain and dysfunction with shoulders and knees, with f/u with ortho MD tomorrow ? ? ? ?PAIN:  ?Are you having pain? Yes ?NPRS scale: 6/10 ?Pain location: knees and shoulders ?Pain orientation: Bilateral  ?PAIN TYPE: aching ?Pain description: constant  ?Aggravating factors: activity ?Relieving factors: rest, supine, sitting ?  ?OBJECTIVE:  ?  ?DIAGNOSTIC FINDINGS:  ?FINDINGS: ?Frontal and lateral views obtained. There are pedicle screws from a ?posterior approach at L3, L4, and L5 with screw tips in the ?respective vertebral bodies. There is an apparent new disc spacer at ?L3-4 with an older disc spacer at L4-5 which appears unchanged in ?position. No evident fracture or spondylolisthesis. Moderate disc ?space narrowing again noted at L4-5. ?  ?IMPRESSION: ?Pedicle screws bilaterally at L3, L4, and L5 with screw tips in ?respective vertebral bodies. Disc spacers at L3-4 and L4-5. No ?fracture or spondylolisthesis. Moderate disc  space narrowing again ?noted at L4-5. ?  ?PATIENT SURVEYS:  ?Modified Oswestry 25/50  ?  ?SCREENING FOR RED FLAGS: ?Bowel or bladder incontinence: No ?  ?COGNITION: ?         Overall cognitive status: Within functional limits for tasks assessed              ?          ?SENSATION: ?         Light touch: Appears intact ?  ?MUSCLE LENGTH: ?Hamstrings: Right 80 deg; Left 80 deg ?  ?POSTURE:  ?Slightly forward flexed and SB R ?  ?PALPATION: ?undetermined ?  ?LUMBARAROM/PROM ?  ?A/PROM A/PROM  ?11/19/2021  ?Flexion 90%  ?Extension 25%  ?Right lateral flexion    ?Left lateral flexion    ?Right rotation    ?Left rotation    ? (Blank rows = not tested) ?  ?LE AROM/PROM: ?  ?A/PROM Right ?11/19/2021 Left ?11/19/2021  ?Hip flexion 120d 120d  ?Hip  extension      ?Hip abduction      ?Hip adduction      ?Hip internal rotation      ?Hip external rotation      ?Knee flexion 120d 120d  ?Knee extension 0d 0d  ?Ankle dorsiflexion      ?Ankle plantarflexion

## 2021-12-12 ENCOUNTER — Ambulatory Visit
Admission: RE | Admit: 2021-12-12 | Discharge: 2021-12-12 | Disposition: A | Discharge status: Home / Self Care | Payer: Medicaid Other | Source: Ambulatory Visit | Attending: Neurosurgery | Admitting: Neurosurgery

## 2021-12-12 DIAGNOSIS — M5414 Radiculopathy, thoracic region: Secondary | ICD-10-CM

## 2021-12-12 DIAGNOSIS — M4802 Spinal stenosis, cervical region: Secondary | ICD-10-CM

## 2021-12-12 MED ORDER — ONDANSETRON HCL 4 MG/2ML IJ SOLN: 4.0000 mg | Freq: Once | INTRAMUSCULAR | Status: DC | PRN

## 2021-12-12 MED ORDER — MEPERIDINE HCL 50 MG/ML IJ SOLN: 50.0000 mg | Freq: Once | INTRAMUSCULAR | Status: DC | PRN

## 2021-12-12 MED ORDER — IOPAMIDOL (ISOVUE-M 300) INJECTION 61%
10.0000 mL | Freq: Once | INTRAMUSCULAR | Status: AC
  Administered 2021-12-12: 10 mL via INTRATHECAL

## 2021-12-12 MED ORDER — DIAZEPAM 5 MG PO TABS
10.0000 mg | ORAL_TABLET | Freq: Once | ORAL | Status: AC
  Administered 2021-12-12: 5 mg via ORAL

## 2021-12-12 NOTE — Discharge Instructions (Signed)

## 2021-12-15 ENCOUNTER — Other Ambulatory Visit: Payer: Self-pay | Admitting: Gastroenterology

## 2021-12-15 ENCOUNTER — Ambulatory Visit: Payer: Medicaid Other

## 2021-12-23 ENCOUNTER — Encounter: Payer: Self-pay | Admitting: Internal Medicine

## 2021-12-23 NOTE — Assessment & Plan Note (Signed)
I doubt CPAP causing congestion- more likely seasonal pollen. ?Plan- keep mask fitting appointment. Refer for Inspire consideration. Sample Spiriva 2.5 ?

## 2021-12-23 NOTE — Assessment & Plan Note (Signed)
Continue Flonase and antihistamine ?

## 2021-12-26 ENCOUNTER — Ambulatory Visit (INDEPENDENT_AMBULATORY_CARE_PROVIDER_SITE_OTHER): Payer: Medicaid Other

## 2021-12-26 DIAGNOSIS — I4819 Other persistent atrial fibrillation: Secondary | ICD-10-CM | POA: Diagnosis not present

## 2021-12-26 LAB — CUP PACEART REMOTE DEVICE CHECK
Battery Impedance: 1768 Ohm
Battery Remaining Longevity: 56 mo
Battery Voltage: 2.76 V
Brady Statistic RA Percent Paced: 4 %
Date Time Interrogation Session: 20230427171546
Implantable Lead Implant Date: 20091023
Implantable Lead Location: 753859
Implantable Lead Model: 5076
Implantable Pulse Generator Implant Date: 20091023
Lead Channel Impedance Value: 445 Ohm
Lead Channel Impedance Value: 67 Ohm
Lead Channel Setting Pacing Amplitude: 2 V

## 2022-01-09 NOTE — Progress Notes (Signed)
Remote pacemaker transmission.   

## 2022-01-26 ENCOUNTER — Emergency Department (HOSPITAL_BASED_OUTPATIENT_CLINIC_OR_DEPARTMENT_OTHER)
Admission: EM | Admit: 2022-01-26 | Discharge: 2022-01-26 | Disposition: A | Discharge status: Home / Self Care | Payer: Medicaid Other | Attending: Emergency Medicine | Admitting: Emergency Medicine

## 2022-01-26 ENCOUNTER — Other Ambulatory Visit: Payer: Self-pay

## 2022-01-26 ENCOUNTER — Encounter (HOSPITAL_BASED_OUTPATIENT_CLINIC_OR_DEPARTMENT_OTHER): Payer: Self-pay | Admitting: Emergency Medicine

## 2022-01-26 ENCOUNTER — Emergency Department (HOSPITAL_BASED_OUTPATIENT_CLINIC_OR_DEPARTMENT_OTHER): Payer: Medicaid Other

## 2022-01-26 DIAGNOSIS — R072 Precordial pain: Secondary | ICD-10-CM | POA: Insufficient documentation

## 2022-01-26 DIAGNOSIS — I10 Essential (primary) hypertension: Secondary | ICD-10-CM | POA: Diagnosis not present

## 2022-01-26 DIAGNOSIS — Z7901 Long term (current) use of anticoagulants: Secondary | ICD-10-CM | POA: Diagnosis not present

## 2022-01-26 LAB — CBC WITH DIFFERENTIAL/PLATELET
Abs Immature Granulocytes: 0.03 10*3/uL (ref 0.00–0.07)
Basophils Absolute: 0 10*3/uL (ref 0.0–0.1)
Basophils Relative: 1 %
Eosinophils Absolute: 0.2 10*3/uL (ref 0.0–0.5)
Eosinophils Relative: 3 %
HCT: 42.4 % (ref 39.0–52.0)
Hemoglobin: 14 g/dL (ref 13.0–17.0)
Immature Granulocytes: 1 %
Lymphocytes Relative: 44 %
Lymphs Abs: 2.6 10*3/uL (ref 0.7–4.0)
MCH: 27.4 pg (ref 26.0–34.0)
MCHC: 33 g/dL (ref 30.0–36.0)
MCV: 83 fL (ref 80.0–100.0)
Monocytes Absolute: 0.5 10*3/uL (ref 0.1–1.0)
Monocytes Relative: 9 %
Neutro Abs: 2.3 10*3/uL (ref 1.7–7.7)
Neutrophils Relative %: 42 %
Platelets: 257 10*3/uL (ref 150–400)
RBC: 5.11 MIL/uL (ref 4.22–5.81)
RDW: 16.4 % — ABNORMAL HIGH (ref 11.5–15.5)
WBC: 5.6 10*3/uL (ref 4.0–10.5)
nRBC: 0 % (ref 0.0–0.2)

## 2022-01-26 LAB — BASIC METABOLIC PANEL
Anion gap: 6 (ref 5–15)
BUN: 17 mg/dL (ref 8–23)
CO2: 29 mmol/L (ref 22–32)
Calcium: 8.9 mg/dL (ref 8.9–10.3)
Chloride: 103 mmol/L (ref 98–111)
Creatinine, Ser: 1.07 mg/dL (ref 0.61–1.24)
GFR, Estimated: >60 mL/min (ref >60)
Glucose, Bld: 101 mg/dL — ABNORMAL HIGH (ref 70–99)
Potassium: 4 mmol/L (ref 3.5–5.1)
Sodium: 138 mmol/L (ref 135–145)

## 2022-01-26 LAB — TROPONIN I (HIGH SENSITIVITY)
Troponin I (High Sensitivity): 4 ng/L (ref <18)
Troponin I (High Sensitivity): 4 ng/L (ref <18)

## 2022-01-26 MED ORDER — IOHEXOL 350 MG/ML SOLN
75.0000 mL | Freq: Once | INTRAVENOUS | Status: AC | PRN
  Administered 2022-01-26: 75 mL via INTRAVENOUS

## 2022-01-26 MED ORDER — ACETAMINOPHEN 325 MG PO TABS
650.0000 mg | ORAL_TABLET | Freq: Once | ORAL | Status: AC
  Administered 2022-01-26: 650 mg via ORAL
  Filled 2022-01-26: qty 2

## 2022-01-26 MED ORDER — DICYCLOMINE HCL 10 MG PO CAPS
10.0000 mg | ORAL_CAPSULE | Freq: Once | ORAL | Status: AC
  Administered 2022-01-26: 10 mg via ORAL
  Filled 2022-01-26: qty 1

## 2022-01-26 MED ORDER — ALUM & MAG HYDROXIDE-SIMETH 200-200-20 MG/5ML PO SUSP
30.0000 mL | Freq: Once | ORAL | Status: AC
Start: 2022-01-26 — End: 2022-01-26
  Administered 2022-01-26: 30 mL via ORAL
  Filled 2022-01-26: qty 30

## 2022-01-26 MED ORDER — SUCRALFATE 1 GM/10ML PO SUSP
1.0000 g | Freq: Three times a day (TID) | ORAL | 0 refills | Status: DC
Start: 2022-01-26 — End: 2023-09-10

## 2022-01-26 NOTE — ED Provider Notes (Signed)
[facility] EMERGENCY DEPARTMENT Provider Note   CSN: [id number] Arrival date & time: 01/26/22  0057     History  Chief Complaint  Patient presents with   Chest Pain    [name] is a 61 y.o. male.  The history is provided by the patient.  Chest Pain Pain location:  Substernal area Pain quality: dull   Pain radiates to:  Does not radiate Pain severity:  Moderate Onset quality:  Sudden Duration:  2 hours Timing:  Constant Progression:  Unchanged Chronicity:  New Context: at rest   Context: not trauma   Relieved by:  Nothing Worsened by:  Nothing Ineffective treatments:  None tried Associated symptoms: no abdominal pain, no AICD problem, no altered mental status, no anorexia, no anxiety, no back pain, no claudication, no cough, no diaphoresis, no dizziness, no dysphagia, no fatigue, no fever, no headache, no heartburn, no lower extremity edema, no nausea, no near-syncope, no numbness, no palpitations, no PND, no shortness of breath, no syncope, no vomiting and no weakness   Risk factors: male sex   Patient with GERD, HTN and h/o PE on eliquis presents with a weeks worth of elevated BP and then 2 hours of CP at rest after eating spicy salty chicken wings and cheese dip.  No DOE, No SOB.  No leg pain or swelling.  No n/v/d.  Has been compliant with Eliquis.      Home Medications Prior to Admission medications   Medication Sig Start Date End Date Taking? Authorizing Provider  sucralfate (CARAFATE) 1 GM/10ML suspension Take 10 mLs (1 g total) by mouth 4 (four) times daily -  with meals and at bedtime. 01/26/22  Yes Okla Qazi, MD  amoxicillin-clavulanate (AUGMENTIN) 875-125 MG tablet Take 1 tablet by mouth 2 (two) times daily. 03/11/21   Baird Lyons D, MD  atorvastatin (LIPITOR) 40 MG tablet Take 40 mg by mouth daily.    [provider]  benzonatate (TESSALON) 200 MG capsule Take 1 capsule (200 mg total) by mouth 3 (three) times daily as needed  for cough. 10/30/21   Cobb, Karie Schwalbe, NP  Budeson-Glycopyrrol-Formoterol (BREZTRI AEROSPHERE) 160-9-4.8 MCG/ACT AERO Inhale 2 puffs into the lungs in the morning and at bedtime. 04/25/21   Baird Lyons D, MD  carvedilol (COREG) 25 MG tablet TAKE 1 TABLET BY MOUTH TWICE A DAY WITH MEALS 10/20/21   Allred, Jeneen Rinks, MD  desonide (DESOWEN) 0.05 % cream Apply 1 application topically 2 (two) times daily as needed (for rash).     [provider]  dexlansoprazole (DEXILANT) 60 MG capsule TAKE 1 CAPSULE (60 MG TOTAL) BY MOUTH DAILY. PLEASE SCHEDULE A YEARLY OFFICE VISIT FOR FURTHER REFILLS. [place] YOU 12/15/21   Armbruster, Carlota Raspberry, MD  dofetilide (TIKOSYN) 500 MCG capsule Take 1 capsule (500 mcg total) by mouth 2 (two) times daily. 06/11/21   Almyra Deforest, PA  doxycycline (VIBRA-TABS) 100 MG tablet Take 1 tablet (100 mg total) by mouth 2 (two) times daily. 04/25/21   Deneise Lever, MD  ELIQUIS 5 MG TABS tablet TAKE 1 TABLET BY MOUTH TWICE A DAY 08/11/21   Allred, Jeneen Rinks, MD  gabapentin (NEURONTIN) 100 MG capsule Take 1-2 capsules (100-200 mg total) by mouth See admin instructions. Patient takes 1 tablet in the morning and 2 tablets at night 01/02/20   Earnie Larsson, MD  loratadine (CLARITIN) 10 MG tablet Take 1 tablet (10 mg total) by mouth daily. 10/30/21   Cobb, Karie Schwalbe, NP  methimazole (TAPAZOLE) 10  MG tablet Take 1 tablet (10 mg total) by mouth daily. 11/06/14   Renato Shin, MD  oxyCODONE 10 MG TABS Take 1 tablet (10 mg total) by mouth every 4 (four) hours as needed for severe pain ((score 7 to 10)). 10/08/20   Viona Gilmore D, NP  Tiotropium Bromide Monohydrate (SPIRIVA RESPIMAT) 2.5 MCG/ACT AERS Inhale 2 puffs into the lungs daily. 11/27/21   Baird Lyons D, MD  triamcinolone ointment (KENALOG) 0.1 % Apply topically 2 (two) times daily as needed (for rash). 02/24/17   [provider]      Allergies    Celecoxib, Hydrocodone, Prednisolone, and Potassium chloride    Review of Systems    Review of Systems  Constitutional:  Negative for diaphoresis, fatigue and fever.  HENT:  Negative for facial swelling and trouble swallowing.   Eyes:  Negative for redness.  Respiratory:  Negative for cough and shortness of breath.   Cardiovascular:  Positive for chest pain. Negative for palpitations, claudication, syncope, PND and near-syncope.  Gastrointestinal:  Negative for abdominal pain, anorexia, heartburn, nausea and vomiting.  Musculoskeletal:  Negative for back pain.  Neurological:  Negative for dizziness, weakness, numbness and headaches.  All other systems reviewed and are negative.  Physical Exam Updated Vital Signs BP (!) 175/108   Pulse (!) 59   Temp 98.2 F (36.8 C) (Oral)   Resp 17   SpO2 99%  Physical Exam Vitals and nursing note reviewed.  Constitutional:      General: He is not in acute distress.    Appearance: Normal appearance.  HENT:     Head: Normocephalic and atraumatic.     Nose: Nose normal.  Eyes:     Conjunctiva/sclera: Conjunctivae normal.     Pupils: Pupils are equal, round, and reactive to light.  Cardiovascular:     Rate and Rhythm: Normal rate and regular rhythm.     Pulses: Normal pulses.     Heart sounds: Normal heart sounds.  Pulmonary:     Effort: Pulmonary effort is normal.     Breath sounds: Normal breath sounds.  Abdominal:     General: Abdomen is flat.     Palpations: Abdomen is soft.     Tenderness: There is no abdominal tenderness. There is no guarding or rebound.     Hernia: No hernia is present.  Musculoskeletal:        General: No tenderness. Normal range of motion.     Cervical back: Normal range of motion and neck supple.     Right lower leg: No edema.     Left lower leg: No edema.  Skin:    General: Skin is warm and dry.     Capillary Refill: Capillary refill takes less than 2 seconds.  Neurological:     General: No focal deficit present.     Mental Status: He is alert and oriented to person, place, and time.   Psychiatric:        Mood and Affect: Mood normal.        Behavior: Behavior normal.    ED Results / Procedures / Treatments   Labs (all labs ordered are listed, but only abnormal results are displayed) Results for orders placed or performed during the hospital encounter of 01/26/22  CBC with Differential  Result Value Ref Range   WBC 5.6 4.0 - 10.5 K/uL   RBC 5.11 4.22 - 5.81 MIL/uL   Hemoglobin 14.0 13.0 - 17.0 g/dL   HCT 42.4 39.0 - 52.0 %  MCV 83.0 80.0 - 100.0 fL   MCH 27.4 26.0 - 34.0 pg   MCHC 33.0 30.0 - 36.0 g/dL   RDW 16.4 (H) 11.5 - 15.5 %   Platelets 257 150 - 400 K/uL   nRBC 0.0 0.0 - 0.2 %   Neutrophils Relative % 42 %   Neutro Abs 2.3 1.7 - 7.7 K/uL   Lymphocytes Relative 44 %   Lymphs Abs 2.6 0.7 - 4.0 K/uL   Monocytes Relative 9 %   Monocytes Absolute 0.5 0.1 - 1.0 K/uL   Eosinophils Relative 3 %   Eosinophils Absolute 0.2 0.0 - 0.5 K/uL   Basophils Relative 1 %   Basophils Absolute 0.0 0.0 - 0.1 K/uL   Immature Granulocytes 1 %   Abs Immature Granulocytes 0.03 0.00 - 0.07 K/uL  Basic metabolic panel  Result Value Ref Range   Sodium 138 135 - 145 mmol/L   Potassium 4.0 3.5 - 5.1 mmol/L   Chloride 103 98 - 111 mmol/L   CO2 29 22 - 32 mmol/L   Glucose, Bld 101 (H) 70 - 99 mg/dL   BUN 17 8 - 23 mg/dL   Creatinine, Ser 1.07 0.61 - 1.24 mg/dL   Calcium 8.9 8.9 - 10.3 mg/dL   GFR, Estimated >60 >60 mL/min   Anion gap 6 5 - 15  Troponin I (High Sensitivity)  Result Value Ref Range   Troponin I (High Sensitivity) 4 <18 ng/L   No results found.  EKG EKG Interpretation  Date/Time:  Monday Jan 26 2022 01:03:52 EDT Ventricular Rate:  57 PR Interval:  159 QRS Duration: 119 QT Interval:  415 QTC Calculation: 404 R Axis:   -45 Text Interpretation: Sinus rhythm Left anterior fasicular block Confirmed by Randal Buba, Astha Probasco ([id number]) on 01/26/2022 1:06:29 AM  Radiology No results found.  Procedures Procedures    Medications Ordered in ED Medications   acetaminophen (TYLENOL) tablet 650 mg (has no administration in time range)  dicyclomine (BENTYL) capsule 10 mg (has no administration in time range)  alum & mag hydroxide-simeth (MAALOX/MYLANTA) 200-200-20 MG/5ML suspension 30 mL (30 mLs Oral Given 01/26/22 0141)  iohexol (OMNIPAQUE) 350 MG/ML injection 75 mL (75 mLs Intravenous Contrast Given 01/26/22 0158)    ED Course/ Medical Decision Making/ A&P                           Medical Decision Making Patient with CP after eating salty spicy wings   Problems Addressed: Precordial pain:    Details: likely GERD will start carafate and gerd friendly diet Primary hypertension: chronic illness or injury    Details: follow up with your PMD and limit salt in the diet.  Based on JNC7/8 there is no indication to treat this BP in the ED  Amount and/or Complexity of Data Reviewed Independent Historian: spouse    Details: see above External Data Reviewed: notes.    Details: previous notes reviewed Labs: ordered.    Details: all labs reviewed: normal WHITE count 5.6 and normal hemoglobin 14.  Normal sodium 138 and potassium 4 and creatinine 1.07 on chemistry panel.  Normal first troponin at 4 Radiology: ordered and independent interpretation performed.    Details: CTA negative by my review ECG/medicine tests: ordered and independent interpretation performed. Decision-making details documented in ED Course.  Risk OTC drugs. Prescription drug management. Risk Details: Ruled out for MI in the ED with 2 negative troponins and unchanged EKG>  Likely GERD.  No  exertional component no SOB.  Ruled out for PE in the ED.  COntinue Eliquis and follow up with your PMD regarding your BP.  Limit salt intact at home.  Strict return precautions given.      Final Clinical Impression(s) / ED Diagnoses Final diagnoses:  Precordial pain  Primary hypertension   Return for intractable cough, coughing up blood, fevers > 100.4 unrelieved by medication, shortness  of breath, intractable vomiting, chest pain, shortness of breath, weakness, numbness, changes in speech, facial asymmetry, abdominal pain, passing out, Inability to tolerate liquids or food, cough, altered mental status or any concerns. No signs of systemic illness or infection. The patient is nontoxic-appearing on exam and vital signs are within normal limits.  I have reviewed the triage vital signs and the nursing notes. Pertinent labs & imaging results that were available during my care of the patient were reviewed by me and considered in my medical decision making (see chart for details). After history, exam, and medical workup I feel the patient has been appropriately medically screened and is safe for discharge home. Pertinent diagnoses were discussed with the patient. Patient was given return precautions.    Rx / DC Orders ED Discharge Orders          Ordered    sucralfate (CARAFATE) 1 GM/10ML suspension  3 times daily with meals & bedtime        01/26/22 0203              Oran Dillenburg, MD 01/26/22 9798

## 2022-01-26 NOTE — ED Triage Notes (Signed)
Complaining of chest pain and high blood pressure this week. Hurting on both sides of his sternum that started an hour ago

## 2022-02-02 ENCOUNTER — Telehealth: Payer: Self-pay | Admitting: Cardiology

## 2022-02-02 ENCOUNTER — Other Ambulatory Visit: Payer: Self-pay | Admitting: Physician Assistant

## 2022-02-02 NOTE — Telephone Encounter (Signed)
*  STAT* If patient is at the pharmacy, call can be transferred to refill team.   1. Which medications need to be refilled? (please list name of each medication and dose if known) dofetilide (TIKOSYN) 500 MCG capsule  2. Which pharmacy/location (including street and city if local pharmacy) is medication to be sent to? [id number] [place], [place] - [address]  3. Do they need a 30 day or 90 day supply? 90 day   Patient is out of medication.

## 2022-02-03 MED ORDER — DOFETILIDE 500 MCG PO CAPS: 500.0000 ug | ORAL_CAPSULE | Freq: Two times a day (BID) | ORAL | 3 refills | Status: DC

## 2022-02-03 NOTE — Telephone Encounter (Signed)
*  STAT* If patient is at the pharmacy, call can be transferred to refill team.   1. Which medications need to be refilled? (please list name of each medication and dose if known)  dofetilide (TIKOSYN) 500 MCG capsule  2. Which pharmacy/location (including street and city if local pharmacy) is medication to be sent to? [id number]- [place], [place] - [address]  3. Do they need a 30 day or 90 day supply?  90 day supply  Patient is following up. Again, stating he is completely out of medication and has now missed 8 doses.

## 2022-02-12 DIAGNOSIS — S6992XA Unspecified injury of left wrist, hand and finger(s), initial encounter: Secondary | ICD-10-CM | POA: Diagnosis not present

## 2022-02-23 ENCOUNTER — Other Ambulatory Visit: Payer: Self-pay | Admitting: Internal Medicine

## 2022-02-23 ENCOUNTER — Other Ambulatory Visit: Payer: Self-pay | Admitting: Gastroenterology

## 2022-03-12 ENCOUNTER — Telehealth: Payer: Self-pay

## 2022-03-12 NOTE — Telephone Encounter (Signed)
   Pre-operative Risk Assessment    Patient Name: [name]  DOB: May 11, 1961 MRN: [id number]      Request for Surgical Clearance    Procedure:   Cervical Spine - left - C4-C5  Date of Surgery:  Clearance 04/14/22                                 Surgeon:  Dr. Lenord Carbo Surgeon's Group or Practice Name:  [facility] Phone number:  [phone] Fax number:  [phone]   Type of Clearance Requested:   - Medical  - Pharmacy:  Hold Apixaban (Eliquis) x 3 days   Type of Anesthesia:  Not Indicated   Additional requests/questions:   None  Signed, Jess Sulak   03/12/2022, 5:01 PM

## 2022-03-13 NOTE — Telephone Encounter (Signed)
I left a message for the pt to call back and schedule a tele visit per pre op provider Tessa C. PAC. Per Johann Capers, pt does need a tele appt and not in office.

## 2022-03-13 NOTE — Telephone Encounter (Signed)
Patient with diagnosis of afib on Eliquis for anticoagulation.    Procedure: Cervical Spine - left - C4-C5 Date of procedure: 04/14/22  CHA2DS2-VASc Score = 3  This indicates a 3.2% annual risk of stroke. The patient's score is based upon: CHF History: 1 HTN History: 1 Diabetes History: 0 Stroke History: 0 Vascular Disease History: 1 Age Score: 0 Gender Score: 0   Also with PE in 2004.  CrCl 72m/min using adjusted body weight due to obesity Platelet count 257K  Pt hasn't been seen by cardiology in a year. Assuming no new clinical changes at needed preop visit, patient can hold Eliquis for 3 days prior to procedure.    **This guidance is not considered finalized until pre-operative APP has relayed final recommendations.**

## 2022-03-13 NOTE — Telephone Encounter (Signed)
   Name: SLADE PIERPOINT  DOB: 1961-06-08  MRN: [id number]  Primary Cardiologist: Thompson Grayer, MD  Chart reviewed as part of pre-operative protocol coverage. Because of Arnold Kester Yuhas's past medical history and time since last visit, he will require a follow-up in-office visit in order to better assess preoperative cardiovascular risk.  Pre-op covering staff: - Please schedule appointment and call patient to inform them. If patient already had an upcoming appointment within acceptable timeframe, please add "pre-op clearance" to the appointment notes so provider is aware. - Please contact requesting surgeon's office via preferred method (i.e, phone, fax) to inform them of need for appointment prior to surgery.  This message will also be routed to pharmacy pool  for input on holding Eliquis as requested below so that this information is available to the clearing provider at time of patient's appointment.   Elgie Collard, PA-C  03/13/2022, 8:08 AM

## 2022-03-16 NOTE — Telephone Encounter (Signed)
Left message x 2 for the pt to call for a tele visit for pre op clearance

## 2022-03-17 NOTE — Telephone Encounter (Signed)
Left message x 3 for the pt to call back for a tele pre op appt. I will update the requesting office the pt has not called back. Once the pt calls back we will be happy to schedule a tele visit for pre op. I will send a letter to the pt to call the office.

## 2022-03-18 ENCOUNTER — Telehealth: Payer: Self-pay | Admitting: *Deleted

## 2022-03-18 NOTE — Telephone Encounter (Signed)
I was able to reach the pt today and he is agreeable to plan of care for a tele visit for pre op clearance. Med rec and consent are done.     Patient Consent for Virtual Visit        [name] has provided verbal consent on 03/18/2022 for a virtual visit (video or telephone).   CONSENT FOR VIRTUAL VISIT FOR:  [name]  By participating in this virtual visit I agree to the following:  I hereby voluntarily request, consent and authorize [facility] and its employed or contracted physicians, physician assistants, nurse practitioners or other licensed health care professionals (the Practitioner), to provide me with telemedicine health care services (the "Services") as deemed necessary by the treating Practitioner. I acknowledge and consent to receive the Services by the Practitioner via telemedicine. I understand that the telemedicine visit will involve communicating with the Practitioner through live audiovisual communication technology and the disclosure of certain medical information by electronic transmission. I acknowledge that I have been given the opportunity to request an in-person assessment or other available alternative prior to the telemedicine visit and am voluntarily participating in the telemedicine visit.  I understand that I have the right to withhold or withdraw my consent to the use of telemedicine in the course of my care at any time, without affecting my right to future care or treatment, and that the Practitioner or I may terminate the telemedicine visit at any time. I understand that I have the right to inspect all information obtained and/or recorded in the course of the telemedicine visit and may receive copies of available information for a reasonable fee.  I understand that some of the potential risks of receiving the Services via telemedicine include:  Delay or interruption in medical evaluation due to technological equipment failure or  disruption; Information transmitted may not be sufficient (e.g. poor resolution of images) to allow for appropriate medical decision making by the Practitioner; and/or  In rare instances, security protocols could fail, causing a breach of personal health information.  Furthermore, I acknowledge that it is my responsibility to provide information about my medical history, conditions and care that is complete and accurate to the best of my ability. I acknowledge that Practitioner's advice, recommendations, and/or decision may be based on factors not within their control, such as incomplete or inaccurate data provided by me or distortions of diagnostic images or specimens that may result from electronic transmissions. I understand that the practice of medicine is not an exact science and that Practitioner makes no warranties or guarantees regarding treatment outcomes. I acknowledge that a copy of this consent can be made available to me via my patient portal ([facility]), or I can request a printed copy by calling the office of [facility].    I understand that my insurance will be billed for this visit.   I have read or had this consent read to me. I understand the contents of this consent, which adequately explains the benefits and risks of the Services being provided via telemedicine.  I have been provided ample opportunity to ask questions regarding this consent and the Services and have had my questions answered to my satisfaction. I give my informed consent for the services to be provided through the use of telemedicine in my medical care

## 2022-03-18 NOTE — Telephone Encounter (Addendum)
I was able to reach the pt today and he is agreeable to plan of care for a tele visit for pre op clearance. Med rec and consent are done. I will update the requesting provider office the pt has been scheduled for a tele visit 03/24/22 @ 2:40 pm. Once the pt has been cleared our office will be sure to fax over clearance notes and any medication recommendations.

## 2022-03-24 ENCOUNTER — Ambulatory Visit (INDEPENDENT_AMBULATORY_CARE_PROVIDER_SITE_OTHER): Payer: Medicaid Other | Admitting: Nurse Practitioner

## 2022-03-24 DIAGNOSIS — Z01810 Encounter for preprocedural cardiovascular examination: Secondary | ICD-10-CM

## 2022-03-24 NOTE — Progress Notes (Signed)
Virtual Visit via Telephone Note   Because of [name]'s co-morbid illnesses, he is at least at moderate risk for complications without adequate follow up.  This format is felt to be most appropriate for this patient at this time.  The patient did not have access to video technology/had technical difficulties with video requiring transitioning to audio format only (telephone).  All issues noted in this document were discussed and addressed.  No physical exam could be performed with this format.  Please refer to the patient's chart for his consent to telehealth for [name].  Evaluation Performed:  Preoperative cardiovascular risk assessment _____________   Date:  03/24/2022   Patient ID:  [name], DOB 08/06/1961, MRN [id number] Patient Location:  Home Provider location:   Office  Primary Care Provider:  [facility] Primary Cardiologist:  Thompson Grayer, MD  Chief Complaint / Patient Profile   61 y.o. 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 Lenord Carbo of [place] and presents today for telephonic preoperative cardiovascular risk assessment.  Past Medical History    Past Medical History:  Diagnosis Date   Atrial fibrillation [name])    s/p afib ablation  PVI 5/12 JA   CAD -nonobstructive    S/P nstemi-type II cath 05/2008 - nonobs. dzs.   Cataract    left eye 2019   Colon polyps    Complication of anesthesia    last 2 surgeries pt had some respiratory issues following [name] required ED visit, dx with bronchitis.    DDD (degenerative disc disease)    evaluate by neurosurgery is ongoing   Depression    ptsd   Duodenal adenoma    Erectile dysfunction    GERD (gastroesophageal reflux disease)    Headache(784.0)     Hemorrhoids    HTN (hypertension)    Hyperlipemia    Hyperthyroidism    Graves dz on methimazole and followed by [name]   Myocardial infarction [facility])    2009   Obesity    Pancreatitis    Polysubstance abuse ([place])    cocaine, last used 1999   Presence of permanent cardiac pacemaker    since 2009   PUD (peptic ulcer disease)    gastritis due to ETOH previously   Pulmonary embolism ([place]) 04/2003   Hx of   Rectal bleeding    Sinus node dysfunction (HCC)    s/p MDT PPM - AAI - V port plugged   Sleep apnea    uses CPAP [facility] 2 YRS   Systolic and diastolic CHF, chronic (HCC)    EF 35% by most recent echo 09/19/10   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   recent ly had tooth pulled painful   Past Surgical History:  Procedure Laterality Date   ANTERIOR CERVICAL DECOMP/DISCECTOMY FUSION  12/15/2011   Procedure: ANTERIOR CERVICAL DECOMPRESSION/DISCECTOMY FUSION 1 LEVEL;  Surgeon: [name], MD;  Location: [facility];  Service: Neurosurgery;  Laterality: N/A;  Cervical Six-Seven Anterior Cervical Diskectomy fusion with Allograft and Plating   ANTERIOR CERVICAL DECOMP/DISCECTOMY FUSION N/A 01/01/2020   Procedure: Anterior Cervical Discectomy Fusion - Cervical Seven- Thoracic One;  Surgeon: Earnie Larsson, MD;  Location: [place];  Service: Neurosurgery;  Laterality: N/A;  Anterior Cervical Discectomy Fusion - Cervical Seven- Thoracic One   ATRIAL ABLATION [name]  12/2010  afib ablation by [name]   ATRIAL FIBRILLATION ABLATION N/A 05/06/2017   Procedure: Atrial Fibrillation Ablation;  Surgeon: Thompson Grayer, MD;  Location: [place] CV LAB;  Service: Cardiovascular;  Laterality: N/A;   BACK [name]     CARDIAC CATHETERIZATION     CADIAC ABLATION [name] 12/2010   CARDIAC CATHETERIZATION N/A 05/08/2016   Procedure: Left Heart Cath and Coronary Angiography;  Surgeon: Peter M Martinique, MD;  Location: [place] CV LAB;  Service: Cardiovascular;  Laterality: N/A;   Fingers removed from right hand.  2/84    traumatic work injury   INSERT / REPLACE / REMOVE PACEMAKER     05/2008     LUMBAR SPINE [name]  02/2009   Dr. Trenton Gammon   PACEMAKER INSERTION  10/09   by Dr [name]   TEE WITHOUT CARDIOVERSION N/A 05/05/2017   Procedure: TRANSESOPHAGEAL ECHOCARDIOGRAM (TEE);  Surgeon: Larey Dresser, MD;  Location: [facility] ENDOSCOPY;  Service: Cardiovascular;  Laterality: N/A;    Allergies  Allergies  Allergen Reactions   Celecoxib Itching   Hydrocodone Itching   Prednisolone Itching   Potassium Chloride Other (See Comments)    Dizziness and nausea     History of Present Illness    [name] is a 61 y.o. male who presents via audio/video conferencing for a telehealth visit today.  Pt was last seen in cardiology clinic on 04/25/2021 by Joesph July, [place].  At that time [name] was doing well. The patient is now pending procedure as outlined above. Since his last visit, he has been stable from a cardiac standpoint. He denies chest pain, palpitations, dyspnea, pnd, orthopnea, n, v, dizziness, syncope, edema, weight gain, or early satiety. All other systems reviewed and are otherwise negative except as noted above.   Home Medications    Prior to Admission medications   Medication Sig Start Date End Date Taking? Authorizing Provider  amoxicillin-clavulanate (AUGMENTIN) 875-125 MG tablet Take 1 tablet by mouth 2 (two) times daily. 03/11/21   Baird Lyons D, MD  atorvastatin (LIPITOR) 40 MG tablet Take 40 mg by mouth daily.    [provider]  benzonatate (TESSALON) 200 MG capsule Take 1 capsule (200 mg total) by mouth 3 (three) times daily as needed for cough. 10/30/21   Cobb, Karie Schwalbe, NP  Budeson-Glycopyrrol-Formoterol (BREZTRI AEROSPHERE) 160-9-4.8 MCG/ACT AERO Inhale 2 puffs into the lungs in the morning and at bedtime. 04/25/21   Baird Lyons D, MD  carvedilol (COREG) 25 MG tablet TAKE 1 TABLET BY MOUTH TWICE A DAY WITH MEALS 10/20/21   Allred, Jeneen Rinks, MD  desonide (DESOWEN) 0.05  % cream Apply 1 application topically 2 (two) times daily as needed (for rash).     [provider]  dexlansoprazole (DEXILANT) 60 MG capsule TAKE 1 CAPSULE (60 MG TOTAL) BY MOUTH DAILY. PLEASE SCHEDULE A YEARLY OFFICE VISIT FOR FURTHER REFILLS. Remer YOU 12/15/21   Armbruster, Carlota Raspberry, MD  dofetilide (TIKOSYN) 500 MCG capsule Take 1 capsule (500 mcg total) by mouth 2 (two) times daily. 02/03/22   Lelon Perla, MD  doxycycline (VIBRA-TABS) 100 MG tablet Take 1 tablet (100 mg total) by mouth 2 (two) times daily. 04/25/21   Deneise Lever, MD  ELIQUIS 5 MG TABS tablet TAKE 1 TABLET BY MOUTH TWICE A DAY 02/24/22   Allred, Jeneen Rinks, MD  gabapentin (NEURONTIN) 100 MG capsule Take 1-2 capsules (100-200 mg total) by mouth See admin instructions. Patient takes 1 tablet in the morning and 2  tablets at night 01/02/20   Earnie Larsson, MD  loratadine (CLARITIN) 10 MG tablet Take 1 tablet (10 mg total) by mouth daily. 10/30/21   Cobb, Karie Schwalbe, NP  methimazole (TAPAZOLE) 10 MG tablet Take 1 tablet (10 mg total) by mouth daily. 11/06/14   Renato Shin, MD  oxyCODONE 10 MG TABS Take 1 tablet (10 mg total) by mouth every 4 (four) hours as needed for severe pain ((score 7 to 10)). 10/08/20   Viona Gilmore D, NP  sucralfate (CARAFATE) 1 GM/10ML suspension Take 10 mLs (1 g total) by mouth 4 (four) times daily -  with meals and at bedtime. 01/26/22   Palumbo, April, MD  Tiotropium Bromide Monohydrate (SPIRIVA RESPIMAT) 2.5 MCG/ACT AERS Inhale 2 puffs into the lungs daily. 11/27/21   Baird Lyons D, MD  triamcinolone ointment (KENALOG) 0.1 % Apply topically 2 (two) times daily as needed (for rash). 02/24/17   [provider]    Physical Exam    Vital Signs:  [name] does not have vital signs available for review today.  Given telephonic nature of communication, physical exam is limited. AAOx3. NAD. Normal affect.  Speech and respirations are unlabored.  Accessory Clinical Findings     None  Assessment & Plan    1.  Preoperative Cardiovascular Risk Assessment:  According to the Revised Cardiac Risk Index (RCRI), his Perioperative Risk of Major Cardiac Event is (%): 0.9. His Functional Capacity in METs is: 7.99 according to the Duke Activity Status Index (DASI).Therefore, based on ACC/AHA guidelines, patient would be at acceptable risk for the planned procedure without further cardiovascular testing.   Patient with diagnosis of afib on Eliquis for anticoagulation.     Procedure: Cervical Spine - left - C4-C5 Date of procedure: 04/14/22   CHA2DS2-VASc Score = 3  This indicates a 3.2% annual risk of stroke. The patient's score is based upon: CHF History: 1 HTN History: 1 Diabetes History: 0 Stroke History: 0 Vascular Disease History: 1 Age Score: 0 Gender Score: 0   Also with PE in 2004.   CrCl 78m/min using adjusted body weight due to obesity Platelet count 257K  Per office protocol, patient can hold Eliquis for 3 days prior to procedure.  Please resume Eliquis as soon as possible postprocedure, at the discretion of the surgeon.  A copy of this note will be routed to requesting surgeon.  Time:   Today, I have spent 6 minutes with the patient with telehealth technology discussing medical history, symptoms, and management plan.     ELenna Sciara NP  03/24/2022, 2:55 PM

## 2022-03-27 ENCOUNTER — Ambulatory Visit (INDEPENDENT_AMBULATORY_CARE_PROVIDER_SITE_OTHER): Payer: Medicaid Other

## 2022-03-27 DIAGNOSIS — I428 Other cardiomyopathies: Secondary | ICD-10-CM

## 2022-03-30 LAB — CUP PACEART REMOTE DEVICE CHECK
Battery Impedance: 1881 Ohm
Battery Remaining Longevity: 53 mo
Battery Voltage: 2.76 V
Brady Statistic RA Percent Paced: 4 %
Date Time Interrogation Session: 20230729112527
Implantable Lead Implant Date: 20091023
Implantable Lead Location: 753859
Implantable Lead Model: 5076
Implantable Pulse Generator Implant Date: 20091023
Lead Channel Impedance Value: 435 Ohm
Lead Channel Impedance Value: 67 Ohm
Lead Channel Setting Pacing Amplitude: 2 V

## 2022-04-09 ENCOUNTER — Encounter: Payer: Self-pay | Admitting: Primary Care

## 2022-04-09 ENCOUNTER — Ambulatory Visit: Payer: Medicaid Other | Admitting: Primary Care

## 2022-04-09 VITALS — BP 124/82 | HR 58 | Temp 98.3°F | Ht 71.0 in | Wt 286.0 lb

## 2022-04-09 DIAGNOSIS — R053 Chronic cough: Secondary | ICD-10-CM

## 2022-04-09 DIAGNOSIS — J302 Other seasonal allergic rhinitis: Secondary | ICD-10-CM

## 2022-04-09 DIAGNOSIS — J3089 Other allergic rhinitis: Secondary | ICD-10-CM | POA: Diagnosis not present

## 2022-04-09 DIAGNOSIS — R058 Other specified cough: Secondary | ICD-10-CM | POA: Diagnosis not present

## 2022-04-09 DIAGNOSIS — G4733 Obstructive sleep apnea (adult) (pediatric): Secondary | ICD-10-CM

## 2022-04-09 DIAGNOSIS — K219 Gastro-esophageal reflux disease without esophagitis: Secondary | ICD-10-CM

## 2022-04-09 LAB — NITRIC OXIDE: Nitric Oxide: 11

## 2022-04-09 MED ORDER — IPRATROPIUM BROMIDE 0.03 % NA SOLN: 2.0000 | Freq: Two times a day (BID) | NASAL | 12 refills | Status: DC

## 2022-04-09 NOTE — Assessment & Plan Note (Addendum)
-   Continue Dexilant '60mg'$  daily  - Keep apt with GI

## 2022-04-09 NOTE — Assessment & Plan Note (Addendum)
-   Former smoker. Patient has had chronic cough for 2 years. Associated nocturnal wheezing. Spirometry and FENO today was normal.  He had CTA in May 2023 that showed clear lungs. PND and GERD likely contributing to upper airway cough. For now we will continue Spiriva Respimat 2 puffs every day in the morning but likely can discontinue at follow up as it has not improved his cough and he has no obstruction on spirometry. Advised he try Mucinex-DM 600-'1200mg'$  every 12 hours for cough. We will check sputum culture.

## 2022-04-09 NOTE — Progress Notes (Signed)
$'@Patient'o$  ID: [name], male    DOB: 12/01/1960, 61 y.o.   MRN: [id number]  Chief Complaint  Patient presents with   Follow-up    [name] is still having some nasal congestion, dry cough, feels that something is trying to come up and it will not.     Referring provider: [facility]  HPI: 61 year old male, former smoker. PMH significant for CHF, afib, HTN, cardiomyopathy, pulmonary embolism, OSA, allergic rhinitis, GERD, hyperthyroidism, Graves disease, UACS. Patient of Dr. Annamaria Boots, last seen on 11/27/21.   Previous [facility] pulmonary encounter: 11/27/21- 61 year old male former smoker followed for OSA, complicated by CAD/CHF/P A. fib/Pacemaker/Eliquis, allergic Rhinitis, hyperthyroid, degenerative disc disease, GERD, Hx PE 2004 CPAP auto 5-15/[employer]/ replaced in July 2022 Download-compliance - no download available Body weight today-292 lbs Covid vax-2 Phizer Flu vax- no Wakes with nasal congestion. Saw 2 [facility] ENTs who blamed CPAP and suggested Inspire. [name] is pending mask fitting. Insurance wouldn't cover OAP. Girlfriend tells him [name] wheezes in sleep. Reports cough beige sputum- makes throat sore. Trying Nyquil, Mucinex, Delsym, Flonase.    04/09/2022- Interim hx  Patient presents today for acute OV/cough. [name] has had a chronic cough for 2 years. [name] has mucus in the back of this throat that [name] can not cough up. Cough is worse at night. [name] has tried flonase nasal spray, mucinex, antibiotics and steriods without improvement. [name] has been taking dayquil with some improvement in cough. [name] has been seen by several ENT providers, [name] has been told that CPAP is causing his cough. CTA in May 2023 showed clear lungs. [name] feels his cough is related to reflux. [name] takes dexilant daily, reflux is well controlled when [name] takes his medication. [name] has made an apt with gastroenterologist.   Pulmonary testing: 04/09/2022 FENO - 11 04/09/2022 Spirometry >> FVC 3.1 (75%), FEV1 2.8 (86%), ratio  89   Allergies  Allergen Reactions   Celecoxib Itching   Hydrocodone Itching   Prednisolone Itching   Potassium Chloride Other (See Comments)    Dizziness and nausea     Immunization History  Administered Date(s) Administered   Influenza Split 08/31/2009, 06/02/2011, 05/13/2012, 05/02/2015, 05/31/2017, 05/01/2018, 05/31/2018   Influenza Whole 08/30/2007, 07/18/2009   Influenza, High Dose Seasonal PF 05/31/2016   Influenza, Seasonal, Injecte, Preservative Fre 05/02/2014, 08/05/2015   Influenza,inj,Quad PF,6+ Mos 05/24/2013, 05/18/2019   Influenza-Unspecified 06/09/2011, 09/13/2013, 10/03/2013, 04/01/2015   PFIZER(Purple Top)SARS-COV-2 Vaccination 11/29/2019, 12/20/2019   Pneumococcal Polysaccharide-23 09/09/2012   Pneumococcal-Unspecified 08/31/2009   Td 07/18/2009   Tdap 08/31/2009, 09/27/2012, 05/31/2014, 10/29/2015    Past Medical History:  Diagnosis Date   Atrial fibrillation ([place])    s/p afib ablation  PVI 5/12 JA   CAD -nonobstructive    S/P nstemi-type II cath 05/2008 - nonobs. dzs.   Cataract    left eye 2019   Colon polyps    Complication of anesthesia    last 2 surgeries pt had some respiratory issues following surgery required ED visit, dx with bronchitis.    DDD (degenerative disc disease)    evaluate by neurosurgery is ongoing   Depression    ptsd   Duodenal adenoma    Erectile dysfunction    GERD (gastroesophageal reflux disease)    Headache(784.0)    Hemorrhoids    HTN (hypertension)    Hyperlipemia    Hyperthyroidism    Graves dz on methimazole and followed by [name]   Myocardial infarction [facility])    2009   Obesity  Pancreatitis    Polysubstance abuse ([place])    cocaine, last used 1999   Presence of permanent cardiac pacemaker    since 2009   PUD (peptic ulcer disease)    gastritis due to ETOH previously   Pulmonary embolism ([place]) 04/2003   Hx of   Rectal bleeding    Sinus node dysfunction (HCC)    s/p MDT PPM - AAI - V port plugged    Sleep apnea    uses CPAP [facility] 2 YRS   Systolic and diastolic CHF, chronic (HCC)    EF 35% by most recent echo 09/19/10   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   recent ly had tooth pulled painful    Tobacco History: Social History   Tobacco Use  Smoking Status Former   Packs/day: 1.00   Years: 15.00   Total pack years: 15.00   Types: Cigarettes   Quit date: 04/04/2003   Years since quitting: 19.0  Smokeless Tobacco Never   Counseling given: Not Answered   Outpatient Medications Prior to Visit  Medication Sig Dispense Refill   atorvastatin (LIPITOR) 40 MG tablet Take 40 mg by mouth daily.     carvedilol (COREG) 25 MG tablet TAKE 1 TABLET BY MOUTH TWICE A DAY WITH MEALS 180 tablet 2   desonide (DESOWEN) 0.05 % cream Apply 1 application topically 2 (two) times daily as needed (for rash).      dexlansoprazole (DEXILANT) 60 MG capsule TAKE 1 CAPSULE (60 MG TOTAL) BY MOUTH DAILY. PLEASE SCHEDULE A YEARLY OFFICE VISIT FOR FURTHER REFILLS. THANK YOU 90 capsule 0   dofetilide (TIKOSYN) 500 MCG capsule Take 1 capsule (500 mcg total) by mouth 2 (two) times daily. 180 capsule 3   ELIQUIS 5 MG TABS tablet TAKE 1 TABLET BY MOUTH TWICE A DAY 60 tablet 5   gabapentin (NEURONTIN) 100 MG capsule Take 1-2 capsules (100-200 mg total) by mouth See admin instructions. Patient takes 1 tablet in the morning and 2 tablets at night 60 capsule 3   methimazole (TAPAZOLE) 10 MG tablet Take 1 tablet (10 mg total) by mouth daily. 30 tablet 1   oxyCODONE 10 MG TABS Take 1 tablet (10 mg total) by mouth every 4 (four) hours as needed for severe pain ((score 7 to 10)). 30 tablet 0   sucralfate (CARAFATE) 1 GM/10ML suspension Take 10 mLs (1 g total) by mouth 4 (four) times daily -  with meals and at bedtime. 420 mL 0   Tiotropium Bromide Monohydrate (SPIRIVA RESPIMAT) 2.5 MCG/ACT AERS Inhale 2 puffs into the lungs daily. 4 g 0   amoxicillin-clavulanate (AUGMENTIN) 875-125 MG tablet Take 1 tablet by mouth 2 (two) times daily.  (Patient not taking: Reported on 04/09/2022) 14 tablet 0   benzonatate (TESSALON) 200 MG capsule Take 1 capsule (200 mg total) by mouth 3 (three) times daily as needed for cough. (Patient not taking: Reported on 04/09/2022) 30 capsule 1   Budeson-Glycopyrrol-Formoterol (BREZTRI AEROSPHERE) 160-9-4.8 MCG/ACT AERO Inhale 2 puffs into the lungs in the morning and at bedtime. (Patient not taking: Reported on 04/09/2022) 10.7 g 0   doxycycline (VIBRA-TABS) 100 MG tablet Take 1 tablet (100 mg total) by mouth 2 (two) times daily. (Patient not taking: Reported on 04/09/2022) 14 tablet 0   loratadine (CLARITIN) 10 MG tablet Take 1 tablet (10 mg total) by mouth daily. (Patient not taking: Reported on 04/09/2022) 90 tablet 1   triamcinolone ointment (KENALOG) 0.1 % Apply topically 2 (two) times daily as needed (for rash). (  Patient not taking: Reported on 04/09/2022)  3   No facility-administered medications prior to visit.      Review of Systems  Review of Systems  Constitutional: Negative.   HENT:  Positive for congestion and postnasal drip.   Respiratory:  Positive for cough. Negative for chest tightness and shortness of breath.        Occ wheezing  Cardiovascular: Negative.      Physical Exam  BP 124/82 (BP Location: Left Arm, Patient Position: Sitting, Cuff Size: Large)   Pulse (!) 58   Temp 98.3 F (36.8 C) (Oral)   Ht '5\' 11"'$  (1.803 m)   Wt 286 lb (129.7 kg)   SpO2 98%   BMI 39.89 kg/m  Physical Exam Constitutional:      Appearance: Normal appearance. [name] is obese.  HENT:     Head: Normocephalic and atraumatic.     Right Ear: There is no impacted cerumen.     Left Ear: There is no impacted cerumen.     Mouth/Throat:     Mouth: Mucous membranes are moist.     Pharynx: Oropharynx is clear.  Cardiovascular:     Rate and Rhythm: Normal rate and regular rhythm.  Pulmonary:     Effort: Pulmonary effort is normal.     Breath sounds: Normal breath sounds.  Musculoskeletal:         General: Normal range of motion.  Skin:    General: Skin is warm and dry.  Neurological:     General: No focal deficit present.     Mental Status: [name] is alert and oriented to person, place, and time. Mental status is at baseline.  Psychiatric:        Mood and Affect: Mood normal.        Behavior: Behavior normal.        Thought Content: Thought content normal.        Judgment: Judgment normal.      Lab Results:  CBC    Component Value Date/Time   WBC 5.6 01/26/2022 0120   RBC 5.11 01/26/2022 0120   HGB 14.0 01/26/2022 0120   HGB 15.1 04/25/2021 0955   HCT 42.4 01/26/2022 0120   HCT 46.5 04/25/2021 0955   PLT 257 01/26/2022 0120   PLT 270 04/25/2021 0955   MCV 83.0 01/26/2022 0120   MCV 80 04/25/2021 0955   MCH 27.4 01/26/2022 0120   MCHC 33.0 01/26/2022 0120   RDW 16.4 (H) 01/26/2022 0120   RDW 17.0 (H) 04/25/2021 0955   LYMPHSABS 2.6 01/26/2022 0120   LYMPHSABS 2.1 04/28/2017 1130   MONOABS 0.5 01/26/2022 0120   EOSABS 0.2 01/26/2022 0120   EOSABS 0.1 04/28/2017 1130   BASOSABS 0.0 01/26/2022 0120   BASOSABS 0.0 04/28/2017 1130    BMET    Component Value Date/Time   NA 138 01/26/2022 0120   NA 136 04/25/2021 0955   K 4.0 01/26/2022 0120   CL 103 01/26/2022 0120   CO2 29 01/26/2022 0120   GLUCOSE 101 (H) 01/26/2022 0120   BUN 17 01/26/2022 0120   BUN 20 04/25/2021 0955   CREATININE 1.07 01/26/2022 0120   CREATININE 0.96 05/06/2016 0844   CALCIUM 8.9 01/26/2022 0120   GFRNONAA >60 01/26/2022 0120   GFRAA 83 09/19/2020 1144    BNP    Component Value Date/Time   BNP 280.5 (H) 04/04/2017 0023    ProBNP    Component Value Date/Time   PROBNP 11.0 04/25/2021 1145    Imaging:  CUP PACEART REMOTE DEVICE CHECK  Result Date: 03/30/2022 Scheduled remote reviewed. Normal device function.  Atrial lead only There were 7 atrial arrhythmias detected, longest was 11 minutes and 36 seconds, on [place] according to previous reports.  AF burden is less than 0.1% Next  remote 91 days. Kathy Breach, RN, CCDS, [employer]    Assessment & Plan:   Upper airway cough syndrome - Former smoker. Patient has had chronic cough for 2 years. Associated nocturnal wheezing. Spirometry and FENO today was normal.  [name] had CTA in May 2023 that showed clear lungs. PND and GERD likely contributing to upper airway cough. For now we will continue Spiriva Respimat 2 puffs every day in the morning but likely can discontinue at follow up as it has not improved his cough and [name] has no obstruction on spirometry. Advised [name] try Mucinex-DM 600-'1200mg'$  every 12 hours for cough. We will check sputum culture.   Seasonal and perennial allergic rhinitis - Continue fluticasone (Flonase) daily  - Start ipratropium nasal spray - 2 puffs per nostril every 12 hours   GERD (gastroesophageal reflux disease) - Continue Dexilant '60mg'$  daily  - Keep apt with GI   OSA (obstructive sleep apnea) -  CPAP compliance report not available on airview, we will request from DME for review    Martyn Ehrich, NP 04/09/2022

## 2022-04-09 NOTE — Assessment & Plan Note (Signed)
-   Continue fluticasone (Flonase) daily  - Start ipratropium nasal spray - 2 puffs per nostril every 12 hours

## 2022-04-09 NOTE — Patient Instructions (Addendum)
Recommendations: - Continue Spiriva Respimat 2 puffs every day in the morning - Continue fluticasone (Flonase) nasal spray as directed  - Try Mucinex-DM 1-2 tablets every 12 hours for cough (do not take with dyquil) - Start ipratropium nasal spray - 2 puffs per nostril every 12 hours - Continue Dexilant '60mg'$  daily  - Keep apt with GI   Orders: - Sputum sample (ordered)  - FENO and spirometry today (re: cough) - Please obtain CPAP compliance report/download   Rx: - Ipratropium nasal spray (sent)  Follow-up: - First available with Dr. Annamaria Boots

## 2022-04-09 NOTE — Assessment & Plan Note (Signed)
-    CPAP compliance report not available on airview, we will request from DME for review

## 2022-04-15 NOTE — Progress Notes (Signed)
Remote pacemaker transmission.   

## 2022-04-20 ENCOUNTER — Other Ambulatory Visit: Payer: Self-pay | Admitting: Gastroenterology

## 2022-04-20 ENCOUNTER — Telehealth: Payer: Self-pay | Admitting: Gastroenterology

## 2022-04-20 MED ORDER — DEXLANSOPRAZOLE 60 MG PO CPDR: 60.0000 mg | DELAYED_RELEASE_CAPSULE | Freq: Every day | ORAL | 0 refills | Status: DC

## 2022-04-20 NOTE — Telephone Encounter (Signed)
Script sent to patient's pharmacy.

## 2022-04-20 NOTE — Telephone Encounter (Signed)
Patient called requesting a refill of Dexilant to the [facility] on [address].  He took his last pill this morning.  Thank you.

## 2022-05-01 ENCOUNTER — Other Ambulatory Visit: Payer: Medicaid Other

## 2022-05-01 DIAGNOSIS — R053 Chronic cough: Secondary | ICD-10-CM

## 2022-05-02 LAB — RESPIRATORY CULTURE OR RESPIRATORY AND SPUTUM CULTURE: MICRO NUMBER:: 13864379

## 2022-05-05 NOTE — Progress Notes (Unsigned)
05/05/2022 [name] [id number] 1960-10-04   Chief Complaint:  History of Present Illness: [name]. 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 He is followed by Dr. Havery Moros.    history of duodenal adenomas.  Last EGD October 2020 showed no residual or recurrent adenomas.  He also has a history of advanced colonic adenoma initially in July 2019.  Rather difficult polyps removed due to looping, he had a follow-up colonoscopy 1 year later which showed 3 small adenomas     Latest Ref Rng & Units 01/26/2022    1:20 AM 04/25/2021    9:55 AM 10/01/2020    3:06 PM  CBC  WBC 4.0 - 10.5 K/uL 5.6  6.3  5.8   Hemoglobin 13.0 - 17.0 g/dL 14.0  15.1  14.0   Hematocrit 39.0 - 52.0 % 42.4  46.5  44.0   Platelets 150 - 400 K/uL 257  270  231        Latest Ref Rng & Units 01/26/2022    1:20 AM 04/25/2021    9:55 AM 10/08/2020    5:16 AM  CMP  Glucose 70 - 99 mg/dL 101  88  142   BUN 8 - 23 mg/dL '17  20  13   '$ Creatinine 0.61 - 1.24 mg/dL 1.07  1.09  1.06   Sodium 135 - 145 mmol/L 138  136  136   Potassium 3.5 - 5.1 mmol/L 4.0  4.4  4.3   Chloride 98 - 111 mmol/L 103  98  102   CO2 22 - 32 mmol/L '29  23  25   '$ Calcium 8.9 - 10.3 mg/dL 8.9  9.5  9.3     ECHO 11/20/2020: 1. Left ventricular ejection fraction, by estimation, is 60 to 65%. The left ventricle has normal function. The left ventricle has no regional wall motion abnormalities. The left ventricular internal cavity size was mildly dilated. Left ventricular diastolic parameters are consistent with Grade I diastolic dysfunction (impaired relaxation). 2. Right ventricular systolic function is normal. The right ventricular size is normal. 3. The mitral valve is  grossly normal. Trivial mitral valve regurgitation. 4. The aortic valve is tricuspid. Aortic valve regurgitation is not visualized. 5. Aortic dilatation noted. There is mild dilatation of the ascending aorta, measuring 39 mm. 6. The inferior vena cava is normal in size with <50% respiratory variability, suggesting right atrial pressure of 8 mmHg. Comparison(s): Changes from prior study are noted. 04/04/2016: LVEF 40-45%.  PAST GI PROCEDURES:   Colonoscopy 01/22/2010 - hemorrhoids, otherwise normal  EGD 11/02/2007 - distal esophagitis   TEE echo 05/05/2017 - EF 35-40%     EGD 03/02/2018 - A 2 cm hiatal hernia was present. - The exam of the esophagus was otherwise normal. No Barrett's - A single 4 mm sessile polyp was found in the gastric antrum. Biopsies were taken with a cold forceps for histology. - The exam of the stomach was otherwise normal. - A single 4 mm sessile polyp was found in the second portion of the duodenum in the sweep at angulated turn and quite difficult to visualize initially. The polyp was removed with a cold biopsy forceps given its location. Resection and retrieval were thought to have been complete. -  A single 7 mm sessile polyp was found in the third portion of the duodenum. The polyp was removed with a hot snare. Resection and retrieval were complete. Some oozing occurred post polypectomy. To prevent bleeding after the polypectomy in light of the patient's Eliquis use, two hemostatic clips were successfully placed. (one other clip misfired, and another clip that was used was defective) - The exam of the duodenum was otherwise normal.   Colonoscopy 03/02/2018 - Redundant colon with significant looping. - One 12 mm polyp in the ascending colon, removed with a cold snare. Resected and retrieved. - One 4 mm polyp in the transverse colon, removed with a cold snare. Resected and retrieved. - One 3 mm polyp at the recto-sigmoid colon, removed with a cold snare. Resected  and retrieved. - Internal hemorrhoids. - The examination was otherwise normal.   Path shows one duodenal adenoma and colonic adenomas      EGD 06/14/19 -  - A 3 cm hiatal hernia was present. - The exam of the esophagus was otherwise normal. - There were benign appearing hypertrophied folds around the pylorus - previously biopsied and no significant pathology noted, no further biopsies taken today. The examined stomach was otherwise normal. - The duodenal bulb and second portion of the duodenum were normal. No polyps noted   Repeat EGD in 3-5 years   Colonoscopy 06/14/19 - The perianal and digital rectal examinations were normal. - Two sessile polyps were found in the ascending colon. The polyps were 3 to 4 mm in size. The smaller lesion was noted at the site of the prior large ascending colon polypectomy site. These polyps were removed with a cold snare. Resection and retrieval were complete. - A 3 to 4 mm polyp was found in the transverse colon. The polyp was sessile. The polyp was removed with a cold snare. Resection and retrieval were complete. - Internal hemorrhoids were found during retroflexion. - The colon revealed excessive looping in the right colon. Prep in the right colon was fair but following lavage adequate views were obtained. - The exam was otherwise without abnormality.   Surgical [P], colon, ascending and transverse, polyp (4) - TUBULAR ADENOMA(S). - SESSILE SERRATED POLYP WITHOUT CYTOLOGIC DYSPLASIA. - HIGH GRADE DYSPLASIA IS NOT IDENTIFIED.  Repeat colon in 3 years     Current Medications, Allergies, Past Medical History, Past Surgical History, Family History and Social History were reviewed in [employer] record.   Review of Systems:   Constitutional: Negative for fever, sweats, chills or weight loss.  Respiratory: Negative for shortness of breath.   Cardiovascular: Negative for chest pain, palpitations and leg swelling.   Gastrointestinal: See HPI.  Musculoskeletal: Negative for back pain or muscle aches.  Neurological: Negative for dizziness, headaches or paresthesias.    Physical Exam: There were no vitals taken for this visit. General: Well developed, w   ***male in no acute distress. Head: Normocephalic and atraumatic. Eyes: No scleral icterus. Conjunctiva pink . Ears: Normal auditory acuity. Mouth: Dentition intact. No ulcers or lesions.  Lungs: Clear throughout to auscultation. Heart: Regular rate and rhythm, no murmur. Abdomen: Soft, nontender and nondistended. No masses or hepatomegaly. Normal bowel sounds x 4 quadrants.  Rectal: *** Musculoskeletal: Symmetrical with no gross deformities. Extremities: No edema. Neurological: Alert oriented x 4. No focal deficits.  Psychological: Alert and cooperative. Normal mood and affect  Assessment and Recommendations:  GERD, hiatal hernia   History of duodenal adenomas  -EGD  History of tubular adenomatous colon  polyps  -Colonoscopy

## 2022-05-06 ENCOUNTER — Ambulatory Visit: Payer: Medicaid Other | Admitting: Nurse Practitioner

## 2022-05-06 ENCOUNTER — Encounter: Payer: Self-pay | Admitting: Nurse Practitioner

## 2022-05-06 ENCOUNTER — Telehealth: Payer: Self-pay

## 2022-05-06 VITALS — BP 130/88 | HR 62 | Ht 71.0 in | Wt 283.0 lb

## 2022-05-06 DIAGNOSIS — D132 Benign neoplasm of duodenum: Secondary | ICD-10-CM

## 2022-05-06 DIAGNOSIS — Z86010 Personal history of colonic polyps: Secondary | ICD-10-CM | POA: Diagnosis not present

## 2022-05-06 DIAGNOSIS — I4891 Unspecified atrial fibrillation: Secondary | ICD-10-CM | POA: Diagnosis not present

## 2022-05-06 DIAGNOSIS — R1013 Epigastric pain: Secondary | ICD-10-CM

## 2022-05-06 MED ORDER — NA SULFATE-K SULFATE-MG SULF 17.5-3.13-1.6 GM/177ML PO SOLN: 1.0000 | ORAL | 0 refills | Status: DC

## 2022-05-06 MED ORDER — FAMOTIDINE 20 MG PO TABS: 20.0000 mg | ORAL_TABLET | Freq: Every day | ORAL | 1 refills | Status: DC

## 2022-05-06 NOTE — Telephone Encounter (Signed)
[facility] Pre-operative Risk Assessment     Request for surgical clearance:     Endoscopy Procedure  What type of surgery is being performed?     Endoscopy and Colonoscopy  When is this surgery scheduled?     06-05-22  What type of clearance is required ?   Pharmacy  Are there any medications that need to be held prior to surgery and how long?  Eliquis x 2 days  Practice name and name of physician performing surgery?      [facility]  What is your office phone and fax number?      Phone- [phone]  [phone]  Anesthesia type (None, local, MAC, general) ?       MAC

## 2022-05-06 NOTE — Patient Instructions (Addendum)
If you are age 63 or younger, your body mass index should be between 19-25. Your Body mass index is 39.47 kg/m. If this is out of the aformentioned range listed, please consider follow up with your Primary Care Provider.  ________________________________________________________  The [facility] providers would like to encourage you to use [facility] to communicate with providers for non-urgent requests or questions.  Due to long hold times on the telephone, sending your provider a message by [facility] may be a faster and more efficient way to get a response.  Please allow 48 business hours for a response.  Please remember that this is for non-urgent requests.  _______________________________________________________  [name] have been scheduled for an endoscopy and colonoscopy. Please follow the written instructions given to you at your visit today. Please pick up your prep supplies at the pharmacy within the next 1-3 days. If you use inhalers (even only as needed), please bring them with you on the day of your procedure.  Due to recent changes in healthcare laws, you may see the results of your imaging and laboratory studies on MyChart before your provider has had a chance to review them.  We understand that in some cases there may be results that are confusing or concerning to you. Not all laboratory results come back in the same time frame and the provider may be waiting for multiple results in order to interpret others.  Please give [name] 48 hours in order for your provider to thoroughly review all the results before contacting the office for clarification of your results.   You will be contacted by our office prior to your procedure for directions on holding your Eliquis.  If you do not hear from our office 1 week prior to your scheduled procedure, please call [phone] to discuss.   Apply a small amount of Desitin inside the anal opening and to the external anal area tid as needed for anal or hemorrhoidal  irritation/bleeding.   Take Famotidine (Pepcid) '20mg'$  one tab at bed time. Continue Dexilant one tab every morning.   Follow up with your primary care physician to check a hepatic panel, last hepatic panel done 10/2020 showed a mildly elevated ALT level  Contact our office if your symptoms worsen   Thank you for entrusting me with your care and choosing [facility]  Carl Best, NP

## 2022-05-06 NOTE — Progress Notes (Signed)
Agree with assessment and plan as outlined.  

## 2022-05-07 NOTE — Telephone Encounter (Addendum)
   Primary Cardiologist: Thompson Grayer, MD  Chart reviewed as part of pre-operative protocol coverage. Given past medical history and time since last visit, based on ACC/AHA guidelines, DIXIE JAFRI would be at acceptable risk for the planned procedure without further cardiovascular testing.   Patient reports he continues to do well since last preoperative virtual visit. He was advised that if he develops new symptoms prior to surgery to contact our office to arrange a follow-up appointment.  He verbalized understanding.  Per office protocol, patient can hold Eliquis for 2 days prior to procedure.   Patient will not need bridging with Lovenox (enoxaparin) around procedure  I will route this recommendation to the requesting party via [facility] fax function and remove from pre-op pool.  Please call with questions.  Emmaline Life, NP-C     05/07/2022, 8:44 AM [address], [address] Office [phone] Fax [phone]

## 2022-05-07 NOTE — Telephone Encounter (Signed)
Patient with diagnosis of atrial fibrillation on Eliquis for anticoagulation.    Procedure: colonoscopy and endoscopy Date of procedure: 06/05/22    CHA2DS2-VASc Score = 3   This indicates a 3.2% annual risk of stroke. The patient's score is based upon: CHF History: 1 HTN History: 1 Diabetes History: 0 Stroke History: 0 Vascular Disease History: 1 Age Score: 0 Gender Score: 0   Also has history of PE in 2004   CrCl 99 (using adjusted body weight) Platelet count 257  Per office protocol, patient can hold Eliquis for 2 days prior to procedure.   Patient will not need bridging with Lovenox (enoxaparin) around procedure.  **This guidance is not considered finalized until pre-operative APP has relayed final recommendations.**

## 2022-05-13 NOTE — Progress Notes (Signed)
HPI   male former smoker followed for OSA, complicated by CAD/CHF/P A. fib/pacemaker/Pradaxa, allergic rhinitis, hyperthyroid,  degenerative disc disease, GERD NPSG 2010- AHI 21/ hr  ---------------------------------------------------------------------------------------   11/27/21- 61 year old male former smoker followed for OSA, complicated by CAD/CHF/P A. fib/Pacemaker/Eliquis, allergic Rhinitis, hyperthyroid, degenerative disc disease, GERD, Hx PE 2004 CPAP auto 5-15/[employer]/ replaced in July 2022 Download-compliance - no download available Body weight today-292 lbs Covid vax-2 Phizer Flu vax- no Wakes with nasal congestion. Saw 2 [facility] ENTs who blamed CPAP and suggested Inspire. He is pending mask fitting. Insurance wouldn't cover OAP. Girlfriend tells him he wheezes in sleep. Reports cough beige sputum- makes throat sore. Trying Nyquil, Mucinex, Delsym, Flonase.   05/14/22- 61 year old male former smoker followed for OSA, complicated by CAD/CHF/P A. fib/Pacemaker/Eliquis, allergic Rhinitis, hyperthyroid, degenerative disc disease, GERD, Hx PE 2004 CPAP auto 5-15/[employer]/ replaced in July 2022 Download-compliance -  Body weight today-286 lbs Covid vax-2 Phizer Flu vax-  LOV 8/10[name], NP- Nasal congestion, dry cough, he blames reflux.> Dexilant/ Pepcid/ apt w GI, Flonase, Ipratropium nasal spray0.3%, Spiriva, Mucinex-DM, -----Pt f/u his OSA he feels like he's sleeping okay but is still having an increase in mucus. His GI is going to do an endoscopy and colonoscopy on 10/6 to see if it could be causing the mucus as well He continues CPAP "every night", but no DL available. We are contacting DME. He sleeps better with CPAP. Bothersome sense of postnasal drainage/ pressure. Can't cough or blow out much. Some L post-temple HA at times. Saw ENT/ Dr Redmond Baseman 3 months ago> CT sinuses understood to be negative. Sputum specimen last week rejected by lab. He seems to be pretty aggressively using  Lysol , cologne and air fresheners, which may be irritating his nose. Getting home examined for mold. Using flovent, saline rinse and ipratropium nasal sprays. I am suggesting referral to Allergist.  Pulmonary testing: 04/09/2022 FENO - 11 04/09/2022 Spirometry >> FVC 3.1 (75%), FEV1 2.8 (86%), ratio 89. Clear CTA May 2023, CTaChest PE 01/26/22-  IMPRESSION: 1. No evidence for pulmonary embolism. 2. No acute cardiopulmonary process. 3. Mild cardiomegaly. 4. Incidental heterogeneous and enlarged thyroid. Recommend thyroid US.   ROS-see HPI   + = positive Constitutional:    weight loss, night sweats, fevers, chills, fatigue, lassitude. HEENT:    headaches, difficulty swallowing, tooth/dental problems, sore throat,       sneezing, itching, ear ache, +nasal congestion, +post nasal drip, snoring CV:    +chest pain, orthopnea, PND, swelling in lower extremities, anasarca,                                                    dizziness, palpitations Resp:   +shortness of breath with exertion or at rest.                +productive cough,   non-productive cough, coughing up of blood.              +change in color of mucus.  wheezing.   Skin:    rash or lesions. GI:  No-   heartburn, indigestion, abdominal pain, nausea, vomiting GU: . MS:   joint pain, stiffness,  Neuro-     nothing unusual Psych:  change in mood or affect.  depression or anxiety.   memory loss.  OBJ- Physical Exam General- Alert, Oriented, Affect-appropriate,  Distress- none acute, + obese Skin- rash-none, lesions- none, excoriation- none Lymphadenopathy- none Head- atraumatic            Eyes- Gross vision intact, PERRLA, conjunctivae and secretions clear            Ears- Hearing, canals-normal            Nose- , + turbinate edema R, no-Septal dev, mucus, polyps, erosion, perforation             Throat- Mallampati III-IV , mucosa clear/not dry , drainage- none seen, tonsils- atrophic, Not hoarse,  Neck- flexible , trachea  midline, no stridor , thyroid nl, carotid no bruit Chest - symmetrical excursion , unlabored           Heart/CV- RRR , no murmur , no gallop  , no rub, nl s1 s2                           - JVD- none , edema- none, stasis changes- none, varices- none           Lung- clear to P&A, wheeze- none, cough- none , dullness-none, rub- none           Chest wall-+ L pacemaker Abd-  Br/ Gen/ Rectal- Not done, not indicated Extrem- cyanosis- none, clubbing, none, atrophy- none, strength- nl. +Missing distal fingers R hand. Neuro- grossly intact to observation

## 2022-05-14 ENCOUNTER — Encounter: Payer: Self-pay | Admitting: Internal Medicine

## 2022-05-14 ENCOUNTER — Ambulatory Visit: Payer: Medicaid Other | Admitting: Internal Medicine

## 2022-05-14 DIAGNOSIS — J3089 Other allergic rhinitis: Secondary | ICD-10-CM

## 2022-05-14 DIAGNOSIS — J302 Other seasonal allergic rhinitis: Secondary | ICD-10-CM | POA: Diagnosis not present

## 2022-05-14 NOTE — Assessment & Plan Note (Signed)
Enlarged thyroid on CT, for f/u by his PCP.

## 2022-05-14 NOTE — Patient Instructions (Addendum)
We can continue CPAP auto 5-15  Order- DME [facility]- provide AirView/ card for download access  Order- referral to [facility]  Chronic Rhinitis  Order- Sputum culture- routine C&S   dx chronic sinusitis

## 2022-05-14 NOTE — Assessment & Plan Note (Signed)
Chronic rhinitis felt mostly as PND. I suspect an element of non-allergic irritation. He has already had ENT eval and CT by Dr Redmond Baseman. Plan- referral to Allergist.

## 2022-05-19 DIAGNOSIS — M542 Cervicalgia: Secondary | ICD-10-CM | POA: Diagnosis not present

## 2022-05-19 DIAGNOSIS — M5412 Radiculopathy, cervical region: Secondary | ICD-10-CM | POA: Diagnosis not present

## 2022-05-23 ENCOUNTER — Other Ambulatory Visit: Payer: Self-pay | Admitting: Gastroenterology

## 2022-05-23 ENCOUNTER — Other Ambulatory Visit: Payer: Self-pay | Admitting: Internal Medicine

## 2022-05-25 NOTE — Telephone Encounter (Signed)
Patient advised that he has been given clearance to hold Eliquis 2 days prior to endocsopy scheduled for 06-05-22.  Patient advised to take last dose of Eliquis on 06-02-22, and he will be advised when to restart Eliquis by Dr Havery Moros after the procedure.  Patient agreed to plan and verbalized understanding.  No further questions.

## 2022-05-30 ENCOUNTER — Other Ambulatory Visit: Payer: Self-pay | Admitting: Nurse Practitioner

## 2022-06-02 ENCOUNTER — Encounter: Payer: Self-pay | Admitting: Certified Registered Nurse Anesthetist

## 2022-06-05 ENCOUNTER — Ambulatory Visit (AMBULATORY_SURGERY_CENTER): Payer: Medicaid Other | Admitting: Gastroenterology

## 2022-06-05 ENCOUNTER — Encounter: Payer: Self-pay | Admitting: Gastroenterology

## 2022-06-05 VITALS — BP 160/101 | HR 58 | Temp 97.8°F | Resp 12 | Ht 71.0 in | Wt 283.0 lb

## 2022-06-05 DIAGNOSIS — I252 Old myocardial infarction: Secondary | ICD-10-CM | POA: Diagnosis not present

## 2022-06-05 DIAGNOSIS — Z09 Encounter for follow-up examination after completed treatment for conditions other than malignant neoplasm: Secondary | ICD-10-CM | POA: Diagnosis not present

## 2022-06-05 DIAGNOSIS — I2510 Atherosclerotic heart disease of native coronary artery without angina pectoris: Secondary | ICD-10-CM | POA: Diagnosis not present

## 2022-06-05 DIAGNOSIS — D120 Benign neoplasm of cecum: Secondary | ICD-10-CM | POA: Diagnosis not present

## 2022-06-05 DIAGNOSIS — Z86010 Personal history of colonic polyps: Secondary | ICD-10-CM

## 2022-06-05 DIAGNOSIS — R1013 Epigastric pain: Secondary | ICD-10-CM

## 2022-06-05 DIAGNOSIS — D124 Benign neoplasm of descending colon: Secondary | ICD-10-CM

## 2022-06-05 DIAGNOSIS — Z8719 Personal history of other diseases of the digestive system: Secondary | ICD-10-CM

## 2022-06-05 DIAGNOSIS — D122 Benign neoplasm of ascending colon: Secondary | ICD-10-CM | POA: Diagnosis not present

## 2022-06-05 DIAGNOSIS — D132 Benign neoplasm of duodenum: Secondary | ICD-10-CM

## 2022-06-05 DIAGNOSIS — D128 Benign neoplasm of rectum: Secondary | ICD-10-CM

## 2022-06-05 DIAGNOSIS — E669 Obesity, unspecified: Secondary | ICD-10-CM | POA: Diagnosis not present

## 2022-06-05 MED ORDER — SODIUM CHLORIDE 0.9 % IV SOLN: 500.0000 mL | Freq: Once | INTRAVENOUS | Status: DC

## 2022-06-05 NOTE — Patient Instructions (Signed)
Resume previous diet and medications. Awaiting pathology results. Repeat Colonoscopy date to be determined based on pathology results.  YOU HAD AN ENDOSCOPIC PROCEDURE TODAY AT THE [facility]:   Refer to the procedure report that was given to you for any specific questions about what was found during the examination.  If the procedure report does not answer your questions, please call your gastroenterologist to clarify.  If you requested that your care partner not be given the details of your procedure findings, then the procedure report has been included in a sealed envelope for you to review at your convenience later.  YOU SHOULD EXPECT: Some feelings of bloating in the abdomen. Passage of more gas than usual.  Walking can help get rid of the air that was put into your GI tract during the procedure and reduce the bloating. If you had a lower endoscopy (such as a colonoscopy or flexible sigmoidoscopy) you may notice spotting of blood in your stool or on the toilet paper. If you underwent a bowel prep for your procedure, you may not have a normal bowel movement for a few days.  Please Note:  You might notice some irritation and congestion in your nose or some drainage.  This is from the oxygen used during your procedure.  There is no need for concern and it should clear up in a day or so.  SYMPTOMS TO REPORT IMMEDIATELY:  Following lower endoscopy (colonoscopy or flexible sigmoidoscopy):  Excessive amounts of blood in the stool  Significant tenderness or worsening of abdominal pains  Swelling of the abdomen that is new, acute  Fever of 100F or higher   For urgent or emergent issues, a gastroenterologist can be reached at any hour by calling [phone]. Do not use MyChart messaging for urgent concerns.    DIET:  We do recommend a small meal at first, but then you may proceed to your regular diet.  Drink plenty of fluids but you should avoid alcoholic beverages for 24  hours.  ACTIVITY:  You should plan to take it easy for the rest of today and you should NOT DRIVE or use heavy machinery until tomorrow (because of the sedation medicines used during the test).    FOLLOW UP: Our staff will call the number listed on your records the next business day following your procedure.  We will call around 7:15- 8:00 am to check on you and address any questions or concerns that you may have regarding the information given to you following your procedure. If we do not reach you, we will leave a message.     If any biopsies were taken you will be contacted by phone or by letter within the next 1-3 weeks.  Please call us at [phone] if you have not heard about the biopsies in 3 weeks.    SIGNATURES/CONFIDENTIALITY: You and/or your care partner have signed paperwork which will be entered into your electronic medical record.  These signatures attest to the fact that that the information above on your After Visit Summary has been reviewed and is understood.  Full responsibility of the confidentiality of this discharge information lies with you and/or your care-partner. 

## 2022-06-05 NOTE — Op Note (Addendum)
[facility] Patient Name: [name] Procedure Date: 06/05/2022 11:44 AM MRN: [id number] Endoscopist: Remo Lipps P. Havery Moros , MD Age: 61 Referring MD:  Date of Birth: 1961-08-13 Gender: Male Account #: [id number] Procedure:                Upper GI endoscopy Indications:              surveillance of duodenal adenomas - removed 2019 Medicines:                Monitored Anesthesia Care Procedure:                Pre-Anesthesia Assessment:                           - Prior to the procedure, a History and Physical                            was performed, and patient medications and                            allergies were reviewed. The patient's tolerance of                            previous anesthesia was also reviewed. The risks                            and benefits of the procedure and the sedation                            options and risks were discussed with the patient.                            All questions were answered, and informed consent                            was obtained. Prior Anticoagulants: The patient has                            taken Eliquis (apixaban), last dose was 2 days                            prior to procedure. ASA Grade Assessment: III - A                            patient with severe systemic disease. After                            reviewing the risks and benefits, the patient was                            deemed in satisfactory condition to undergo the                            procedure.  After obtaining informed consent, the endoscope was                            passed under direct vision. Throughout the                            procedure, the patient's blood pressure, pulse, and                            oxygen saturations were monitored continuously. The                            GIF [id number] was introduced through the                            mouth, and advanced to the second  part of duodenum.                            The upper GI endoscopy was accomplished without                            difficulty. The patient tolerated the procedure                            well. Scope In: Scope Out: Findings:                 Esophagogastric landmarks were identified: the                            Z-line was found at 39 cm, the gastroesophageal                            junction was found at 39 cm and the upper extent of                            the gastric folds was found at 41 cm from the                            incisors.                           A 2 cm hiatal hernia was present.                           The exam of the esophagus was otherwise normal.                           The entire examined stomach was normal.                           A single 3 mm sessile polypoid lesion was found in                            the  third portion of the duodenum. The lesion was                            removed with a cold biopsy forceps. Resection and                            retrieval were complete.                           The exam of the duodenum was otherwise normal. Complications:            No immediate complications. Estimated blood loss:                            Minimal. Estimated Blood Loss:     Estimated blood loss was minimal. Impression:               - Esophagogastric landmarks identified.                           - 2 cm hiatal hernia.                           - Normal esophagus otherwise                           - Normal stomach.                           - A single duodenal polypoid lesion. Resected and                            retrieved. Recommendation:           - Patient has a contact number available for                            emergencies. The signs and symptoms of potential                            delayed complications were discussed with the                            patient. Return to normal activities tomorrow.                             Written discharge instructions were provided to the                            patient.                           - Resume previous diet.                           - Continue present medications.                           -  Resume Eliquis tomorrow per colonoscopy note                           - Await pathology results. Remo Lipps P. Evangelyne Loja, MD 06/05/2022 12:29:53 PM This report has been signed electronically.

## 2022-06-05 NOTE — Op Note (Addendum)
[facility] Patient Name: [name] Procedure Date: 06/05/2022 11:43 AM MRN: [id number] Endoscopist: Remo Lipps P. Havery Moros , MD Age: 61 Referring MD:  Date of Birth: 1961/02/19 Gender: Male Account #: [id number] Procedure:                Colonoscopy Indications:              High risk colon cancer surveillance: Personal                            history of colonic polyps - advanced polyp removed                            2019 in piecemeal, last exam 2020 Medicines:                Monitored Anesthesia Care Procedure:                Pre-Anesthesia Assessment:                           - Prior to the procedure, a History and Physical                            was performed, and patient medications and                            allergies were reviewed. The patient's tolerance of                            previous anesthesia was also reviewed. The risks                            and benefits of the procedure and the sedation                            options and risks were discussed with the patient.                            All questions were answered, and informed consent                            was obtained. Prior Anticoagulants: The patient has                            taken Eliquis (apixaban), last dose was 2 days                            prior to procedure. ASA Grade Assessment: III - A                            patient with severe systemic disease. After                            reviewing the risks and benefits, the patient was  deemed in satisfactory condition to undergo the                            procedure.                           After obtaining informed consent, the colonoscope                            was passed under direct vision. Throughout the                            procedure, the patient's blood pressure, pulse, and                            oxygen saturations were monitored continuously. The                             Olympus CF-[id number]) Colonoscope was                            introduced through the anus and advanced to the the                            cecum, identified by appendiceal orifice and                            ileocecal valve. The colonoscopy was performed                            without difficulty. The patient tolerated the                            procedure well. The quality of the bowel                            preparation was good. The ileocecal valve,                            appendiceal orifice, and rectum were photographed. Scope In: 11:59:48 AM Scope Out: 12:18:23 PM Scope Withdrawal Time: 0 hours 15 minutes 20 seconds  Total Procedure Duration: 0 hours 18 minutes 35 seconds  Findings:                 The perianal and digital rectal examinations were                            normal.                           A 4 to 5 mm polyp was found in the cecum. The polyp                            was sessile. The polyp was removed with a cold  snare. Resection and retrieval were complete.                           Two sessile polyps were found in the ascending                            colon. The polyps were 3 mm in size. These polyps                            were removed with a cold snare. Resection and                            retrieval were complete.                           A 3 mm polyp was found in the descending colon. The                            polyp was sessile. The polyp was removed with a                            cold snare. Resection and retrieval were complete.                           Two sessile polyps were found in the proximal                            rectum. The polyps were 2 to 3 mm in size. These                            polyps were removed with a cold snare. Resection                            and retrieval were complete.                           Internal hemorrhoids were found  during retroflexion.                           The exam was otherwise without abnormality. Complications:            No immediate complications. Estimated blood loss:                            Minimal. Estimated Blood Loss:     Estimated blood loss was minimal. Impression:               - One 4 to 5 mm polyp in the cecum, removed with a                            cold snare. Resected and retrieved.                           - Two 3 mm  polyps in the ascending colon, removed                            with a cold snare. Resected and retrieved.                           - One 3 mm polyp in the descending colon, removed                            with a cold snare. Resected and retrieved.                           - Two 2 to 3 mm polyps in the proximal rectum,                            removed with a cold snare. Resected and retrieved.                           - Internal hemorrhoids.                           - The examination was otherwise normal. Recommendation:           - Patient has a contact number available for                            emergencies. The signs and symptoms of potential                            delayed complications were discussed with the                            patient. Return to normal activities tomorrow.                            Written discharge instructions were provided to the                            patient.                           - Resume previous diet.                           - Continue present medications.                           - Resume Eliquis tomorrow                           - Await pathology results. Remo Lipps P. Liel Rudden, MD 06/05/2022 12:24:11 PM This report has been signed electronically.

## 2022-06-05 NOTE — Progress Notes (Signed)
VS by As  Pt's states no medical or surgical changes since previsit or office visit.

## 2022-06-05 NOTE — Progress Notes (Signed)
Called to room to assist during endoscopic procedure.  Patient ID and intended procedure confirmed with present staff. Received instructions for my participation in the procedure from the performing physician.  

## 2022-06-05 NOTE — Progress Notes (Signed)
A/ox3, pleased with MAC, report to RN 

## 2022-06-05 NOTE — Progress Notes (Signed)
[facility] History and Physical   Primary Care Physician:  [facility]   Reason for Procedure:   History of duodenal adenomas, history of colon polyps  Plan:    EGD and colonoscopy     HPI: [name] is a 61 y.o. male  here for EGD to survey history of duodenal adenomas (2019) and colonoscopy surveillance, history of piecemeal removal of advanced polyp in 2019, last exam 2020 with 3 additional smaller polyps. Patient denies any bowel symptoms at this time. No family history of colon cancer known. Otherwise feels well without any cardiopulmonary symptoms. History of AF and on Eliquis - this has been held for 2 days preprocedure.   I have discussed risks / benefits of anesthesia and endoscopic procedure with [name] and they wish to proceed with the exams as outlined today.    Past Medical History:  Diagnosis Date   Atrial fibrillation [facility])    s/p afib ablation  PVI 5/12 JA   CAD -nonobstructive    S/P nstemi-type II cath 05/2008 - nonobs. dzs.   Cataract    left eye 2019   Colon polyps    Complication of anesthesia    last 2 surgeries pt had some respiratory issues following surgery required ED visit, dx with bronchitis.    DDD (degenerative disc disease)    evaluate by neurosurgery is ongoing   Depression    ptsd   Duodenal adenoma    Erectile dysfunction    GERD (gastroesophageal reflux disease)    Headache(784.0)    Hemorrhoids    HTN (hypertension)    Hyperlipemia    Hyperthyroidism    Graves dz on methimazole and followed by [name]   Myocardial infarction [facility])    2009   Obesity    Pancreatitis    Polysubstance abuse ([place])    cocaine, last used 1999   Presence of permanent cardiac pacemaker    since 2009   PUD (peptic ulcer disease)    gastritis due to ETOH previously   Pulmonary embolism ([place]) 04/2003   Hx of   Rectal bleeding    Sinus node dysfunction (HCC)    s/p MDT PPM - AAI - V port plugged   Sleep  apnea    uses CPAP [facility] 2 YRS   Systolic and diastolic CHF, chronic (HCC)    EF 35% by most recent echo 09/19/10   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   recent ly had tooth pulled painful    Past Surgical History:  Procedure Laterality Date   ANTERIOR CERVICAL DECOMP/DISCECTOMY FUSION  12/15/2011   Procedure: ANTERIOR CERVICAL DECOMPRESSION/DISCECTOMY FUSION 1 LEVEL;  Surgeon: [name], MD;  Location: [facility];  Service: Neurosurgery;  Laterality: N/A;  Cervical Six-Seven Anterior Cervical Diskectomy fusion with Allograft and Plating   ANTERIOR CERVICAL DECOMP/DISCECTOMY FUSION N/A 01/01/2020   Procedure: Anterior Cervical Discectomy Fusion - Cervical Seven- Thoracic One;  Surgeon: Earnie Larsson, MD;  Location: [facility];  Service: Neurosurgery;  Laterality: N/A;  Anterior Cervical Discectomy Fusion - Cervical Seven- Thoracic One   ATRIAL ABLATION SURGERY  12/30/2010   afib ablation by [name]   ATRIAL FIBRILLATION ABLATION N/A 05/06/2017   Procedure: Atrial Fibrillation Ablation;  Surgeon: Thompson Grayer, MD;  Location: [facility];  Service: Cardiovascular;  Laterality: N/A;   BACK SURGERY     CARDIAC CATHETERIZATION     CADIAC ABLATION [name] 12/2010   CARDIAC CATHETERIZATION N/A 05/08/2016   Procedure:  Left Heart Cath and Coronary Angiography;  Surgeon: Peter M Martinique, MD;  Location: [facility];  Service: Cardiovascular;  Laterality: N/A;   Fingers removed from right hand.  10/01/1982   traumatic work injury   [place] / REPLACE / REMOVE PACEMAKER     05/2008     KNEE ARTHROSCOPY Left 06/03/2021   LUMBAR SPINE SURGERY  02/28/2009   Dr. Trenton Gammon   PACEMAKER INSERTION  05/31/2008   by Dr [name]   TEE WITHOUT CARDIOVERSION N/A 05/05/2017   Procedure: TRANSESOPHAGEAL ECHOCARDIOGRAM (TEE);  Surgeon: Larey Dresser, MD;  Location: [facility] ENDOSCOPY;  Service: Cardiovascular;  Laterality: N/A;    Prior to Admission medications   Medication Sig Start Date End Date Taking? Authorizing  Provider  carvedilol (COREG) 25 MG tablet TAKE 1 TABLET BY MOUTH TWICE A DAY WITH MEALS 05/25/22  Yes Monge, Helane Gunther, NP  desonide (DESOWEN) 0.05 % cream Apply 1 application topically 2 (two) times daily as needed (for rash).    Yes [provider]  dexlansoprazole (DEXILANT) 60 MG capsule Take 1 capsule (60 mg total) by mouth daily. 05/25/22  Yes Cornelio Parkerson, Carlota Raspberry, MD  dofetilide (TIKOSYN) 500 MCG capsule Take 1 capsule (500 mcg total) by mouth 2 (two) times daily. 02/03/22  Yes Crenshaw, Denice Bors, MD  ELIQUIS 5 MG TABS tablet TAKE 1 TABLET BY MOUTH TWICE A DAY 02/24/22  Yes Allred, Jeneen Rinks, MD  famotidine (PEPCID) 20 MG tablet TAKE 1 TABLET BY MOUTH EVERYDAY AT BEDTIME 06/01/22  Yes Kennedy-Smith, Patrecia Pour, NP  fluticasone (FLONASE) 50 MCG/ACT nasal spray Place into both nostrils. 06/04/22  Yes [provider]  gabapentin (NEURONTIN) 100 MG capsule Take 1-2 capsules (100-200 mg total) by mouth See admin instructions. Patient takes 1 tablet in the morning and 2 tablets at night 01/02/20  Yes Pool, Mallie Mussel, MD  ipratropium (ATROVENT) 0.03 % nasal spray Place 2 sprays into both nostrils every 12 (twelve) hours. 04/09/22  Yes Martyn Ehrich, NP  loratadine (CLARITIN) 10 MG tablet Take 10 mg by mouth daily. 05/23/22  Yes [provider]  methimazole (TAPAZOLE) 10 MG tablet Take 1 tablet (10 mg total) by mouth daily. 11/06/14  Yes Renato Shin, MD  olmesartan (BENICAR) 20 MG tablet Take 20 mg by mouth daily. 05/07/22  Yes [provider]  oxyCODONE-acetaminophen (PERCOCET) 10-325 MG tablet Take 1 tablet by mouth 4 (four) times daily as needed. 06/04/22  Yes [provider]  sucralfate (CARAFATE) 1 GM/10ML suspension Take 10 mLs (1 g total) by mouth 4 (four) times daily -  with meals and at bedtime. 01/26/22  Yes Palumbo, April, MD  atorvastatin (LIPITOR) 40 MG tablet Take 40 mg by mouth daily. Patient not taking: Reported on 06/05/2022    [provider]   erythromycin ophthalmic ointment SMARTSIG:In Eye(s) Patient not taking: Reported on 06/05/2022 06/01/22   [provider]  oxyCODONE 10 MG TABS Take 1 tablet (10 mg total) by mouth every 4 (four) hours as needed for severe pain ((score 7 to 10)). Patient not taking: Reported on 06/05/2022 10/08/20   Viona Gilmore D, NP  Tiotropium Bromide Monohydrate (SPIRIVA RESPIMAT) 2.5 MCG/ACT AERS Inhale 2 puffs into the lungs daily. Patient not taking: Reported on 06/05/2022 11/27/21   Baird Lyons D, MD  triamcinolone ointment (KENALOG) 0.1 % APPLY MODERATE AMOUNT TO AFFECTED AREA DAILY RASH 02/24/22   [provider]    Current Outpatient Medications  Medication Sig Dispense Refill   carvedilol (COREG) 25 MG  tablet TAKE 1 TABLET BY MOUTH TWICE A DAY WITH MEALS 180 tablet 2   desonide (DESOWEN) 0.05 % cream Apply 1 application topically 2 (two) times daily as needed (for rash).      dexlansoprazole (DEXILANT) 60 MG capsule Take 1 capsule (60 mg total) by mouth daily. 90 capsule 1   dofetilide (TIKOSYN) 500 MCG capsule Take 1 capsule (500 mcg total) by mouth 2 (two) times daily. 180 capsule 3   ELIQUIS 5 MG TABS tablet TAKE 1 TABLET BY MOUTH TWICE A DAY 60 tablet 5   famotidine (PEPCID) 20 MG tablet TAKE 1 TABLET BY MOUTH EVERYDAY AT BEDTIME 30 tablet 1   fluticasone (FLONASE) 50 MCG/ACT nasal spray Place into both nostrils.     gabapentin (NEURONTIN) 100 MG capsule Take 1-2 capsules (100-200 mg total) by mouth See admin instructions. Patient takes 1 tablet in the morning and 2 tablets at night 60 capsule 3   ipratropium (ATROVENT) 0.03 % nasal spray Place 2 sprays into both nostrils every 12 (twelve) hours. 30 mL 12   loratadine (CLARITIN) 10 MG tablet Take 10 mg by mouth daily.     methimazole (TAPAZOLE) 10 MG tablet Take 1 tablet (10 mg total) by mouth daily. 30 tablet 1   olmesartan (BENICAR) 20 MG tablet Take 20 mg by mouth daily.     oxyCODONE-acetaminophen (PERCOCET) 10-325 MG  tablet Take 1 tablet by mouth 4 (four) times daily as needed.     sucralfate (CARAFATE) 1 GM/10ML suspension Take 10 mLs (1 g total) by mouth 4 (four) times daily -  with meals and at bedtime. 420 mL 0   atorvastatin (LIPITOR) 40 MG tablet Take 40 mg by mouth daily. (Patient not taking: Reported on 06/05/2022)     erythromycin ophthalmic ointment SMARTSIG:In Eye(s) (Patient not taking: Reported on 06/05/2022)     oxyCODONE 10 MG TABS Take 1 tablet (10 mg total) by mouth every 4 (four) hours as needed for severe pain ((score 7 to 10)). (Patient not taking: Reported on 06/05/2022) 30 tablet 0   Tiotropium Bromide Monohydrate (SPIRIVA RESPIMAT) 2.5 MCG/ACT AERS Inhale 2 puffs into the lungs daily. (Patient not taking: Reported on 06/05/2022) 4 g 0   triamcinolone ointment (KENALOG) 0.1 % APPLY MODERATE AMOUNT TO AFFECTED AREA DAILY RASH     Current Facility-Administered Medications  Medication Dose Route Frequency Provider Last Rate Last Admin   0.9 %  sodium chloride infusion  500 mL Intravenous Once Kuulei Kleier, Carlota Raspberry, MD        Allergies as of 06/05/2022 - Review Complete 06/05/2022  Allergen Reaction Noted   Celecoxib Itching 12/03/2010   Hydrocodone Itching 12/03/2010   Prednisolone Itching 12/03/2010   Potassium chloride Other (See Comments) 03/09/2017    Family History  Problem Relation Age of Onset   Heart disease Mother    Breast cancer Mother    Lung cancer Father    Diabetes Other    Colon cancer Neg Hx    Esophageal cancer Neg Hx    Rectal cancer Neg Hx    Stomach cancer Neg Hx     Social History   Socioeconomic History   Marital status: Legally Separated    Spouse name: Not on file   Number of children: 7   Years of education: Not on file   Highest education level: Not on file  Occupational History   Occupation: unemployed    Employer: UNEMPLOYED  Tobacco Use   Smoking status: Former    Packs/day:  1.00    Years: 15.00    Total pack years: 15.00    Types:  Cigarettes    Quit date: 04/04/2003    Years since quitting: 19.1   Smokeless tobacco: Never  Vaping Use   Vaping Use: Never used  Substance and Sexual Activity   Alcohol use: No    Comment: formerly heavy ETOH,  no alcohol in 20 years   Drug use: No    Comment: previously used crack cocaine, quit 1999,  + marijuana   Sexual activity: Yes    Birth control/protection: Condom  Other Topics Concern   Not on file  Social History Narrative   Unemployed currently.  Previously worked as a Dealer.  Lives in [place].   Social Determinants of Health   Financial Resource Strain: Not on file  Food Insecurity: Not on file  Transportation Needs: Not on file  Physical Activity: Not on file  Stress: Not on file  Social Connections: Not on file  Intimate Partner Violence: Not on file    Review of Systems: All other review of systems negative except as mentioned in the HPI.  Physical Exam: Vital signs BP (!) 145/99   Pulse 63   Temp 97.8 F (36.6 C)   Ht '5\' 11"'$  (1.803 m)   Wt 283 lb (128.4 kg)   SpO2 94%   BMI 39.47 kg/m   General:   Alert,  Well-developed, pleasant and cooperative in NAD Lungs:  Clear throughout to auscultation.   Heart:  Regular rate and rhythm Abdomen:  Soft, nontender and nondistended.   Neuro/Psych:  Alert and cooperative. Normal mood and affect. A and O x 3  Jolly Mango, MD [facility]

## 2022-06-08 ENCOUNTER — Telehealth: Payer: Self-pay

## 2022-06-08 NOTE — Telephone Encounter (Signed)
  Follow up Call-     06/05/2022   10:48 AM 12/12/2021   10:16 AM  Call back number  Post procedure Call Back phone  # [phone]  Permission to leave phone message Yes      Patient questions:  Do you have a fever, pain , or abdominal swelling? No. Pain Score  0 *  Have you tolerated food without any problems? Yes.    Have you been able to return to your normal activities? Yes.    Do you have any questions about your discharge instructions: Diet   No. Medications  No. Follow up visit  No.  Do you have questions or concerns about your Care? No.  Actions: * If pain score is 4 or above: No action needed, pain <4.

## 2022-06-11 DIAGNOSIS — M47812 Spondylosis without myelopathy or radiculopathy, cervical region: Secondary | ICD-10-CM | POA: Diagnosis not present

## 2022-06-11 DIAGNOSIS — Z6841 Body Mass Index (BMI) 40.0 and over, adult: Secondary | ICD-10-CM | POA: Diagnosis not present

## 2022-06-26 ENCOUNTER — Ambulatory Visit (INDEPENDENT_AMBULATORY_CARE_PROVIDER_SITE_OTHER): Payer: Medicaid Other

## 2022-06-26 DIAGNOSIS — I428 Other cardiomyopathies: Secondary | ICD-10-CM | POA: Diagnosis not present

## 2022-07-01 ENCOUNTER — Ambulatory Visit: Payer: Medicaid Other | Admitting: Physician Assistant

## 2022-07-01 ENCOUNTER — Encounter: Payer: Self-pay | Admitting: Physician Assistant

## 2022-07-01 VITALS — BP 122/100 | HR 68 | Ht 71.0 in | Wt 297.2 lb

## 2022-07-01 DIAGNOSIS — R1013 Epigastric pain: Secondary | ICD-10-CM

## 2022-07-01 DIAGNOSIS — D132 Benign neoplasm of duodenum: Secondary | ICD-10-CM

## 2022-07-01 DIAGNOSIS — K449 Diaphragmatic hernia without obstruction or gangrene: Secondary | ICD-10-CM | POA: Diagnosis not present

## 2022-07-01 LAB — CUP PACEART REMOTE DEVICE CHECK
Battery Impedance: 1964 Ohm
Battery Remaining Longevity: 51 mo
Battery Voltage: 2.75 V
Brady Statistic RA Percent Paced: 4 %
Date Time Interrogation Session: 20231031154627
Implantable Lead Connection Status: 753985
Implantable Lead Implant Date: 20091023
Implantable Lead Location: 753859
Implantable Lead Model: 5076
Implantable Pulse Generator Implant Date: 20091023
Lead Channel Impedance Value: 442 Ohm
Lead Channel Impedance Value: 67 Ohm
Lead Channel Setting Pacing Amplitude: 2 V
Zone Setting Status: 755011

## 2022-07-01 MED ORDER — AMBULATORY NON FORMULARY MEDICATION: 3 refills | Status: DC

## 2022-07-01 MED ORDER — FAMOTIDINE 40 MG PO TABS: 40.0000 mg | ORAL_TABLET | Freq: Every day | ORAL | 11 refills | Status: DC

## 2022-07-01 NOTE — Progress Notes (Signed)
If symptoms persist despite medical management then would proceed with cross sectional imaging. Prior RUQ US showed no gallstones not too long ago.

## 2022-07-01 NOTE — Patient Instructions (Signed)
Continue Dexilant 60 mg daily.  We have sent the following medications to your pharmacy for you to pick up at your convenience: Famotidine 40 mg nightly.  GI Cocktail 5-10 ML every 4-6 hours as needed.   _______________________________________________________  If you are age 63 or older, your body mass index should be between 23-30. Your Body mass index is 41.46 kg/m. If this is out of the aforementioned range listed, please consider follow up with your Primary Care Provider.  If you are age 15 or younger, your body mass index should be between 19-25. Your Body mass index is 41.46 kg/m. If this is out of the aformentioned range listed, please consider follow up with your Primary Care Provider.   ________________________________________________________  The [facility] providers would like to encourage you to use [facility] to communicate with providers for non-urgent requests or questions.  Due to long hold times on the telephone, sending your provider a message by [facility] may be a faster and more efficient way to get a response.  Please allow 48 business hours for a response.  Please remember that this is for non-urgent requests.  _______________________________________________________

## 2022-07-01 NOTE — Progress Notes (Signed)
Chief Complaint: Hiatal hernia and "pain"  HPI:    [name] is a 61 year old African-American male with past medical history as listed below including CAD (11/20/2020 echo with an LVEF 60-65%), A-fib status post ablation 2012 in 2018 on Eliquis, CAD status post NSTEMI 2009, sick sinus syndrome status post pacemaker 2009, OSA on CPAP, Graves' disease, GERD, duodenal adenomas and tubular adenomatous polyps and multiple others, known to Dr. Havery Moros, who presents to clinic today with a complaint of hiatal hernia and "pain".     06/14/2019 EGD with a 3 cm hiatal hernia and benign-appearing hypertrophied folds around the pylorus and duodenum.  Advised to repeat EGD 05/2022.    06/14/2019 colonoscopy with 3 tubular adenomas/sessile serrated polyps removed from the ascending and transverse colon.  Repeat recommended in 05/2022.    05/06/2022 patient seen in clinic by Cottie Banda Smith-NP for abdominal pain.  At that time discussed reflux.  On Dexilant 60 mg in the morning.  Discussed a sputum may cough.  Also some epigastric discomfort.  Also describes some anorectal burning.  He was on Ibuprofen 200 mg 3 tabs daily for neck back and shoulder pain.  At that time recommended scheduling EGD and colonoscopy.  He was continued on Dexilant 60 every morning and added Famotidine 20 mg nightly.    06/05/2022 EGD with a 2 cm hiatal hernia and a single duodenal polypoid lesion.  Pathology showed adenomatous polyp and repeat was recommended in 1 year.    06/05/2022 colonoscopy with one 4-5 mm polyp in the cecum, 2 to 3 mm polyps in the ascending colon, one 3 mm polyp in the descending colon, two 2-3 mm polyps in proximal rectum, internal hemorrhoids and otherwise normal.  Pathology showed adenomatous polyps and repeat recommended in 3 years.     Today, the patient presents to clinic and tells me that he often, almost every day has pain in his epigastrium which radiates through to his back.  Yesterday he had a  "terrible fit with this", he blames it on his hiatal hernia.  Tells me he woke up in the morning and drank a full glass of orange juice and took all of his medicines and did not eat anything and then all day he just felt bad, when he ate dinner the pain worsened.    Denies fever, chills, weight loss or blood in his stool.  Past Medical History:  Diagnosis Date   Atrial fibrillation [facility])    s/p afib ablation  PVI 5/12 JA   CAD -nonobstructive    S/P nstemi-type II cath 05/2008 - nonobs. dzs.   Cataract    left eye 2019   Colon polyps    Complication of anesthesia    last 2 surgeries pt had some respiratory issues following surgery required ED visit, dx with bronchitis.    DDD (degenerative disc disease)    evaluate by neurosurgery is ongoing   Depression    ptsd   Duodenal adenoma    Erectile dysfunction    GERD (gastroesophageal reflux disease)    Headache(784.0)    Hemorrhoids    HTN (hypertension)    Hyperlipemia    Hyperthyroidism    Graves dz on methimazole and followed by [name]   Myocardial infarction [facility])    2009   Obesity    Pancreatitis    Polysubstance abuse ([place])    cocaine, last used 1999   Presence of permanent cardiac pacemaker    since 2009   PUD (peptic ulcer  disease)    gastritis due to ETOH previously   Pulmonary embolism ([place]) 04/2003   Hx of   Rectal bleeding    Sinus node dysfunction (HCC)    s/p MDT PPM - AAI - V port plugged   Sleep apnea    uses CPAP [facility] 2 YRS   Systolic and diastolic CHF, chronic (HCC)    EF 35% by most recent echo 09/19/10   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   recent ly had tooth pulled painful    Past Surgical History:  Procedure Laterality Date   ANTERIOR CERVICAL DECOMP/DISCECTOMY FUSION  12/15/2011   Procedure: ANTERIOR CERVICAL DECOMPRESSION/DISCECTOMY FUSION 1 LEVEL;  Surgeon: [name], MD;  Location: [place] [facility];  Service: Neurosurgery;  Laterality: N/A;  Cervical Six-Seven Anterior Cervical Diskectomy fusion with  Allograft and Plating   ANTERIOR CERVICAL DECOMP/DISCECTOMY FUSION N/A 01/01/2020   Procedure: Anterior Cervical Discectomy Fusion - Cervical Seven- Thoracic One;  Surgeon: Earnie Larsson, MD;  Location: [place];  Service: Neurosurgery;  Laterality: N/A;  Anterior Cervical Discectomy Fusion - Cervical Seven- Thoracic One   ATRIAL ABLATION SURGERY  12/30/2010   afib ablation by [name]   ATRIAL FIBRILLATION ABLATION N/A 05/06/2017   Procedure: Atrial Fibrillation Ablation;  Surgeon: Thompson Grayer, MD;  Location: [place] [facility];  Service: Cardiovascular;  Laterality: N/A;   BACK SURGERY     CARDIAC CATHETERIZATION     CADIAC ABLATION [name] 12/2010   CARDIAC CATHETERIZATION N/A 05/08/2016   Procedure: Left Heart Cath and Coronary Angiography;  Surgeon: Peter M Martinique, MD;  Location: [place] [facility];  Service: Cardiovascular;  Laterality: N/A;   Fingers removed from right hand.  10/01/1982   traumatic work injury   [place] / REPLACE / REMOVE PACEMAKER     05/2008     KNEE ARTHROSCOPY Left 06/03/2021   LUMBAR SPINE SURGERY  02/28/2009   Dr. Trenton Gammon   PACEMAKER INSERTION  05/31/2008   by Dr [name]   TEE WITHOUT CARDIOVERSION N/A 05/05/2017   Procedure: TRANSESOPHAGEAL ECHOCARDIOGRAM (TEE);  Surgeon: Larey Dresser, MD;  Location[facility] ENDOSCOPY;  Service: Cardiovascular;  Laterality: N/A;    Current Outpatient Medications  Medication Sig Dispense Refill   atorvastatin (LIPITOR) 40 MG tablet Take 40 mg by mouth daily. (Patient not taking: Reported on 06/05/2022)     carvedilol (COREG) 25 MG tablet TAKE 1 TABLET BY MOUTH TWICE A DAY WITH MEALS 180 tablet 2   desonide (DESOWEN) 0.05 % cream Apply 1 application topically 2 (two) times daily as needed (for rash).      dexlansoprazole (DEXILANT) 60 MG capsule Take 1 capsule (60 mg total) by mouth daily. 90 capsule 1   dofetilide (TIKOSYN) 500 MCG capsule Take 1 capsule (500 mcg total) by mouth 2 (two) times daily. 180 capsule 3   ELIQUIS 5 MG  TABS tablet TAKE 1 TABLET BY MOUTH TWICE A DAY 60 tablet 5   erythromycin ophthalmic ointment SMARTSIG:In Eye(s) (Patient not taking: Reported on 06/05/2022)     famotidine (PEPCID) 20 MG tablet TAKE 1 TABLET BY MOUTH EVERYDAY AT BEDTIME 30 tablet 1   fluticasone (FLONASE) 50 MCG/ACT nasal spray Place into both nostrils.     gabapentin (NEURONTIN) 100 MG capsule Take 1-2 capsules (100-200 mg total) by mouth See admin instructions. Patient takes 1 tablet in the morning and 2 tablets at night 60 capsule 3   ipratropium (ATROVENT) 0.03 % nasal spray Place 2 sprays into both nostrils every 12 (  twelve) hours. 30 mL 12   loratadine (CLARITIN) 10 MG tablet Take 10 mg by mouth daily.     methimazole (TAPAZOLE) 10 MG tablet Take 1 tablet (10 mg total) by mouth daily. 30 tablet 1   olmesartan (BENICAR) 20 MG tablet Take 20 mg by mouth daily.     oxyCODONE 10 MG TABS Take 1 tablet (10 mg total) by mouth every 4 (four) hours as needed for severe pain ((score 7 to 10)). (Patient not taking: Reported on 06/05/2022) 30 tablet 0   oxyCODONE-acetaminophen (PERCOCET) 10-325 MG tablet Take 1 tablet by mouth 4 (four) times daily as needed.     sucralfate (CARAFATE) 1 GM/10ML suspension Take 10 mLs (1 g total) by mouth 4 (four) times daily -  with meals and at bedtime. 420 mL 0   Tiotropium Bromide Monohydrate (SPIRIVA RESPIMAT) 2.5 MCG/ACT AERS Inhale 2 puffs into the lungs daily. (Patient not taking: Reported on 06/05/2022) 4 g 0   triamcinolone ointment (KENALOG) 0.1 % APPLY MODERATE AMOUNT TO AFFECTED AREA DAILY RASH     No current facility-administered medications for this visit.    Allergies as of 07/01/2022 - Review Complete 06/05/2022  Allergen Reaction Noted   Celecoxib Itching 12/03/2010   Hydrocodone Itching 12/03/2010   Prednisolone Itching 12/03/2010   Potassium chloride Other (See Comments) 03/09/2017    Family History  Problem Relation Age of Onset   Heart disease Mother    Breast cancer Mother     Lung cancer Father    Diabetes Other    Colon cancer Neg Hx    Esophageal cancer Neg Hx    Rectal cancer Neg Hx    Stomach cancer Neg Hx     Social History   Socioeconomic History   Marital status: Legally Separated    Spouse name: Not on file   Number of children: 7   Years of education: Not on file   Highest education level: Not on file  Occupational History   Occupation: unemployed    Employer: UNEMPLOYED  Tobacco Use   Smoking status: Former    Packs/day: 1.00    Years: 15.00    Total pack years: 15.00    Types: Cigarettes    Quit date: 04/04/2003    Years since quitting: 19.2   Smokeless tobacco: Never  Vaping Use   Vaping Use: Never used  Substance and Sexual Activity   Alcohol use: No    Comment: formerly heavy ETOH,  no alcohol in 20 years   Drug use: No    Comment: previously used crack cocaine, quit 1999,  + marijuana   Sexual activity: Yes    Birth control/protection: Condom  Other Topics Concern   Not on file  Social History Narrative   Unemployed currently.  Previously worked as a Dealer.  Lives in [place].   Social Determinants of Health   Financial Resource Strain: Not on file  Food Insecurity: Not on file  Transportation Needs: Not on file  Physical Activity: Not on file  Stress: Not on file  Social Connections: Not on file  Intimate Partner Violence: Not on file    Review of Systems:    Constitutional: No weight loss, fever or chills Cardiovascular: No chest pain  Respiratory: No SOB Gastrointestinal: See HPI and otherwise negative   Physical Exam:  Vital signs: BP (!) 122/100   Pulse 68   Ht '5\' 11"'$  (1.803 m)   Wt 297 lb 4 oz (134.8 kg)   BMI 41.46 kg/m  Constitutional:   Pleasant AA male appears to be in NAD, Well developed, Well nourished, alert and cooperative Respiratory: Respirations even and unlabored. Lungs clear to auscultation bilaterally.   No wheezes, crackles, or rhonchi.  Cardiovascular: Normal S1, S2. No MRG.  Regular rate and rhythm. No peripheral edema, cyanosis or pallor.  Gastrointestinal:  Soft, nondistended, mild epigastric ttp. No rebound or guarding. Normal bowel sounds. No appreciable masses or hepatomegaly. Rectal:  Not performed.  Psychiatric: Oriented to person, place and time. Demonstrates good judgement and reason without abnormal affect or behaviors.  RELEVANT LABS AND IMAGING: CBC    Component Value Date/Time   WBC 5.6 01/26/2022 0120   RBC 5.11 01/26/2022 0120   HGB 14.0 01/26/2022 0120   HGB 15.1 04/25/2021 0955   HCT 42.4 01/26/2022 0120   HCT 46.5 04/25/2021 0955   PLT 257 01/26/2022 0120   PLT 270 04/25/2021 0955   MCV 83.0 01/26/2022 0120   MCV 80 04/25/2021 0955   MCH 27.4 01/26/2022 0120   MCHC 33.0 01/26/2022 0120   RDW 16.4 (H) 01/26/2022 0120   RDW 17.0 (H) 04/25/2021 0955   LYMPHSABS 2.6 01/26/2022 0120   LYMPHSABS 2.1 04/28/2017 1130   MONOABS 0.5 01/26/2022 0120   EOSABS 0.2 01/26/2022 0120   EOSABS 0.1 04/28/2017 1130   BASOSABS 0.0 01/26/2022 0120   BASOSABS 0.0 04/28/2017 1130    CMP     Component Value Date/Time   NA 138 01/26/2022 0120   NA 136 04/25/2021 0955   K 4.0 01/26/2022 0120   CL 103 01/26/2022 0120   CO2 29 01/26/2022 0120   GLUCOSE 101 (H) 01/26/2022 0120   BUN 17 01/26/2022 0120   BUN 20 04/25/2021 0955   CREATININE 1.07 01/26/2022 0120   CREATININE 0.96 05/06/2016 0844   CALCIUM 8.9 01/26/2022 0120   PROT 6.5 12/26/2019 0950   ALBUMIN 4.2 12/26/2019 0950   AST 22 12/26/2019 0950   ALT 23 12/26/2019 0950   ALKPHOS 105 12/26/2019 0950   BILITOT 0.5 12/26/2019 0950   GFRNONAA >60 01/26/2022 0120   GFRAA 83 09/19/2020 1144    Assessment: 1.  Epigastric pain: Patient feels this is from his hiatal hernia, more than likely breakthrough gastritis +/- hiatal hernia contributing 2.  Hiatal hernia 3.  Duodenal adenoma: Repeat EGD recommended in a year from now, 06/2023  Plan: 1.  Recommend the patient add Famotidine 40 mg  nightly.  Prescribed #30 with 11 refills. 2.  Continue Dexilant 60 mg every morning 3.  Prescribed GI cocktail 5-10 mL Q 4-6 hours as needed for breakthrough discomfort. 4.  Patient to follow in clinic with [name] as needed.  Or in a year for repeat EGD given duodenal adenoma.  Ellouise Newer, PA-C [facility] 07/01/2022, 9:08 AM  Cc: [facility], [facility]

## 2022-07-02 DIAGNOSIS — M47812 Spondylosis without myelopathy or radiculopathy, cervical region: Secondary | ICD-10-CM | POA: Diagnosis not present

## 2022-07-02 NOTE — Progress Notes (Signed)
Remote pacemaker transmission.   

## 2022-07-07 DIAGNOSIS — N528 Other male erectile dysfunction: Secondary | ICD-10-CM | POA: Diagnosis not present

## 2022-07-07 DIAGNOSIS — Z6841 Body Mass Index (BMI) 40.0 and over, adult: Secondary | ICD-10-CM | POA: Diagnosis not present

## 2022-07-07 DIAGNOSIS — R3989 Other symptoms and signs involving the genitourinary system: Secondary | ICD-10-CM | POA: Diagnosis not present

## 2022-07-21 ENCOUNTER — Encounter: Payer: Medicaid Other | Admitting: Internal Medicine

## 2022-07-21 DIAGNOSIS — I428 Other cardiomyopathies: Secondary | ICD-10-CM | POA: Insufficient documentation

## 2022-07-29 ENCOUNTER — Ambulatory Visit: Payer: Medicaid Other | Admitting: Pulmonary Disease

## 2022-07-29 ENCOUNTER — Encounter: Payer: Self-pay | Admitting: Pulmonary Disease

## 2022-07-29 VITALS — BP 120/74 | HR 63 | Ht 71.0 in | Wt 299.2 lb

## 2022-07-29 DIAGNOSIS — R053 Chronic cough: Secondary | ICD-10-CM | POA: Diagnosis not present

## 2022-07-29 DIAGNOSIS — J302 Other seasonal allergic rhinitis: Secondary | ICD-10-CM

## 2022-07-29 DIAGNOSIS — J3089 Other allergic rhinitis: Secondary | ICD-10-CM

## 2022-07-29 NOTE — Progress Notes (Unsigned)
Synopsis: Referred in November 2023 for cough  Subjective:   PATIENT ID: [name] GENDER: male DOB: 07-26-1961, MRN: [id number]   HPI  Chief Complaint  Patient presents with   Acute Visit    Increased cough over the past few weeks. Productive cough with beige phlegm. Denies any SOB or fever.    61 year old male, former smoker. PMH significant for CHF, afib, HTN, cardiomyopathy, pulmonary embolism, OSA, allergic rhinitis, GERD, hyperthyroidism, Graves disease, UACS. Patient of Dr. Annamaria Boots, last seen on 05/14/22.  He continues to complain of mucous in his throat which leads to cough. He is currently on famotidine at bedtime and dexlansoprazole daily for GERD. He is using atrovent and fluticasone nasal sprays for post nasal drainage. He has been evaluated by ENT this year with normal CT sinus scan. He tried sinus rinses in the past. He is taking claritin for seasonal allergies. He is on CPAP for OSA and there is concern that the CPAP is contributing to his throat congestion and cough. He was evaluated by ENT for the [facility] device but his BMI is too high right now.   He denies fevers, chills or sweats. The cough does not wake him up at night. FENO was normal earlier this year. CTA in May 2023 shows no issues with his airways or lung parenchyma. He has tried mucinex in the past without much relief. He was referred to allergy clinic but he has not seen them yet.    Past Medical History:  Diagnosis Date   Atrial fibrillation [facility])    s/p afib ablation  PVI 5/12 JA   CAD -nonobstructive    S/P nstemi-type II cath 05/2008 - nonobs. dzs.   Cataract    left eye 2019   Colon polyps    Complication of anesthesia    last 2 surgeries pt had some respiratory issues following surgery required ED visit, dx with bronchitis.    DDD (degenerative disc disease)    evaluate by neurosurgery is ongoing   Depression    ptsd   Duodenal adenoma    Erectile dysfunction    GERD (gastroesophageal  reflux disease)    Headache(784.0)    Hemorrhoids    Hiatal hernia    HTN (hypertension)    Hyperlipemia    Hyperthyroidism    Graves dz on methimazole and followed by [name]   Myocardial infarction [facility])    2009   Obesity    Pancreatitis    Polysubstance abuse ([place])    cocaine, last used 1999   Presence of permanent cardiac pacemaker    since 2009   PUD (peptic ulcer disease)    gastritis due to ETOH previously   Pulmonary embolism ([place]) 04/2003   Hx of   Rectal bleeding    Sinus node dysfunction (HCC)    s/p MDT PPM - AAI - V port plugged   Sleep apnea    uses CPAP [facility] 2 YRS   Systolic and diastolic CHF, chronic (HCC)    EF 35% by most recent echo 09/19/10   Tooth absence 2014   recent ly had tooth pulled painful     Family History  Problem Relation Age of Onset   Heart disease Mother    Breast cancer Mother    Lung cancer Father    Diabetes Other    Colon cancer Neg Hx    Esophageal cancer Neg Hx    Rectal cancer Neg Hx    Stomach cancer  Neg Hx      Social History   Socioeconomic History   Marital status: Legally Separated    Spouse name: Not on file   Number of children: 7   Years of education: Not on file   Highest education level: Not on file  Occupational History   Occupation: unemployed    Employer: UNEMPLOYED  Tobacco Use   Smoking status: Former    Packs/day: 1.00    Years: 15.00    Total pack years: 15.00    Types: Cigarettes    Quit date: 04/04/2003    Years since quitting: 19.3   Smokeless tobacco: Never  Vaping Use   Vaping Use: Never used  Substance and Sexual Activity   Alcohol use: No    Comment: formerly heavy ETOH,  no alcohol in 20 years   Drug use: No    Comment: previously used crack cocaine, quit 1999,  + marijuana   Sexual activity: Yes    Birth control/protection: Condom  Other Topics Concern   Not on file  Social History Narrative   Unemployed currently.  Previously worked as a Dealer.  Lives in  [place].   Social Determinants of Health   Financial Resource Strain: Not on file  Food Insecurity: Not on file  Transportation Needs: Not on file  Physical Activity: Not on file  Stress: Not on file  Social Connections: Not on file  Intimate Partner Violence: Not on file     Allergies  Allergen Reactions   Celecoxib Itching   Hydrocodone Itching   Prednisolone Itching   Potassium Chloride Other (See Comments)    Dizziness and nausea      Outpatient Medications Prior to Visit  Medication Sig Dispense Refill   AMBULATORY NON FORMULARY MEDICATION Medication Name: 90 ML Lidocaine 2%:90 ML Dicyclomine 10 mg/1m:270 ML Maalox-Take 5-10 ml every 4-6 hours as needed. 450 mL 3   atorvastatin (LIPITOR) 40 MG tablet Take 40 mg by mouth daily.     carvedilol (COREG) 25 MG tablet TAKE 1 TABLET BY MOUTH TWICE A DAY WITH MEALS 180 tablet 2   desonide (DESOWEN) 0.05 % cream Apply 1 application topically 2 (two) times daily as needed (for rash).      dexlansoprazole (DEXILANT) 60 MG capsule Take 1 capsule (60 mg total) by mouth daily. 90 capsule 1   dofetilide (TIKOSYN) 500 MCG capsule Take 1 capsule (500 mcg total) by mouth 2 (two) times daily. 180 capsule 3   ELIQUIS 5 MG TABS tablet TAKE 1 TABLET BY MOUTH TWICE A DAY 60 tablet 5   erythromycin ophthalmic ointment      famotidine (PEPCID) 40 MG tablet Take 1 tablet (40 mg total) by mouth daily. 30 tablet 11   fluticasone (FLONASE) 50 MCG/ACT nasal spray Place into both nostrils.     gabapentin (NEURONTIN) 100 MG capsule Take 1-2 capsules (100-200 mg total) by mouth See admin instructions. Patient takes 1 tablet in the morning and 2 tablets at night 60 capsule 3   ipratropium (ATROVENT) 0.03 % nasal spray Place 2 sprays into both nostrils every 12 (twelve) hours. 30 mL 12   loratadine (CLARITIN) 10 MG tablet Take 10 mg by mouth daily.     methimazole (TAPAZOLE) 10 MG tablet Take 1 tablet (10 mg total) by mouth daily. 30 tablet 1    olmesartan (BENICAR) 20 MG tablet Take 20 mg by mouth daily.     oxyCODONE 10 MG TABS Take 1 tablet (10 mg total) by mouth every  4 (four) hours as needed for severe pain ((score 7 to 10)). 30 tablet 0   oxyCODONE-acetaminophen (PERCOCET) 10-325 MG tablet Take 1 tablet by mouth 4 (four) times daily as needed.     sucralfate (CARAFATE) 1 GM/10ML suspension Take 10 mLs (1 g total) by mouth 4 (four) times daily -  with meals and at bedtime. 420 mL 0   Tiotropium Bromide Monohydrate (SPIRIVA RESPIMAT) 2.5 MCG/ACT AERS Inhale 2 puffs into the lungs daily. 4 g 0   triamcinolone ointment (KENALOG) 0.1 % APPLY MODERATE AMOUNT TO AFFECTED AREA DAILY RASH     No facility-administered medications prior to visit.    Review of Systems  Constitutional:  Negative for chills, fever, malaise/fatigue and weight loss.  HENT:  Positive for congestion. Negative for sinus pain and sore throat.   Eyes: Negative.   Respiratory:  Positive for cough and sputum production. Negative for hemoptysis, shortness of breath and wheezing.   Cardiovascular:  Negative for chest pain, palpitations, orthopnea, claudication and leg swelling.  Gastrointestinal:  Negative for abdominal pain, heartburn, nausea and vomiting.  Genitourinary: Negative.   Musculoskeletal:  Negative for joint pain and myalgias.  Skin:  Negative for rash.  Neurological:  Negative for weakness.  Endo/Heme/Allergies: Negative.   Psychiatric/Behavioral: Negative.        Objective:   Vitals:   07/29/22 1608  BP: 120/74  Pulse: 63  SpO2: 97%  Weight: 299 lb 3.2 oz (135.7 kg)  Height: '5\' 11"'$  (1.803 m)     Physical Exam Constitutional:      General: He is not in acute distress. HENT:     Head: Normocephalic and atraumatic.  Eyes:     Extraocular Movements: Extraocular movements intact.     Conjunctiva/sclera: Conjunctivae normal.     Pupils: Pupils are equal, round, and reactive to light.  Cardiovascular:     Rate and Rhythm: Normal rate  and regular rhythm.     Pulses: Normal pulses.     Heart sounds: Normal heart sounds. No murmur heard. Pulmonary:     Effort: Pulmonary effort is normal.     Breath sounds: Normal breath sounds.  Abdominal:     General: Bowel sounds are normal.     Palpations: Abdomen is soft.  Musculoskeletal:     Right lower leg: No edema.     Left lower leg: No edema.  Lymphadenopathy:     Cervical: No cervical adenopathy.  Skin:    General: Skin is warm and dry.  Neurological:     General: No focal deficit present.     Mental Status: He is alert.  Psychiatric:        Mood and Affect: Mood normal.        Behavior: Behavior normal.        Thought Content: Thought content normal.        Judgment: Judgment normal.       CBC    Component Value Date/Time   WBC 5.6 01/26/2022 0120   RBC 5.11 01/26/2022 0120   HGB 14.0 01/26/2022 0120   HGB 15.1 04/25/2021 0955   HCT 42.4 01/26/2022 0120   HCT 46.5 04/25/2021 0955   PLT 257 01/26/2022 0120   PLT 270 04/25/2021 0955   MCV 83.0 01/26/2022 0120   MCV 80 04/25/2021 0955   MCH 27.4 01/26/2022 0120   MCHC 33.0 01/26/2022 0120   RDW 16.4 (H) 01/26/2022 0120   RDW 17.0 (H) 04/25/2021 0955   LYMPHSABS 2.6 01/26/2022 0120  LYMPHSABS 2.1 04/28/2017 1130   MONOABS 0.5 01/26/2022 0120   EOSABS 0.2 01/26/2022 0120   EOSABS 0.1 04/28/2017 1130   BASOSABS 0.0 01/26/2022 0120   BASOSABS 0.0 04/28/2017 1130     Chest imaging: CT Chest 01/26/22 Cardiovascular: The heart is mildly enlarged. Aorta is normal in size. There is adequate opacification of the pulmonary arteries to the segmental level. There is no evidence for pulmonary embolism. Left-sided central venous catheter tip ends in the distal SVC.   Mediastinum/Nodes: Left thyroid lobe is heterogeneous and mildly enlarged. There are no enlarged mediastinal or hilar lymph nodes. Esophagus is nondilated.   Lungs/Pleura: Lungs are clear. No pleural effusion or pneumothorax.  PFT:      No data to display          Labs:  Path:  Echo:  Heart Catheterization:       Assessment & Plan:   Chronic cough - Plan: Ambulatory referral to Speech Therapy, Ambulatory referral to Allergy  Seasonal and perennial allergic rhinitis - Plan: Ambulatory referral to Allergy  Discussion: 61 year old male, former smoker. PMH significant for CHF, afib, HTN, cardiomyopathy, pulmonary embolism, OSA, allergic rhinitis, GERD, hyperthyroidism, Graves disease, UACS. Patient of Dr. Annamaria Boots, last seen on 05/14/22.  He has chronic cough that is not responsive to sinus treatment, GERD treatment and antihistamine treatment for allergies or LAMA inhaler therapy. He has chronic throat congestion. We will place referral for allergy evaluation based on prior recommendations by Dr. Annamaria Boots. We will refer patient to speech therapy for further evaluation of his chronic cough and whether modified barium swallow is needed.  Follow up as scheduled with Dr. Annamaria Boots.  Freda Jackson, MD [facility] Office: [phone]    Current Outpatient Medications:    AMBULATORY NON FORMULARY MEDICATION, Medication Name: 90 ML Lidocaine 2%:90 ML Dicyclomine 10 mg/4m:270 ML Maalox-Take 5-10 ml every 4-6 hours as needed., Disp: 450 mL, Rfl: 3   atorvastatin (LIPITOR) 40 MG tablet, Take 40 mg by mouth daily., Disp: , Rfl:    carvedilol (COREG) 25 MG tablet, TAKE 1 TABLET BY MOUTH TWICE A DAY WITH MEALS, Disp: 180 tablet, Rfl: 2   desonide (DESOWEN) 0.05 % cream, Apply 1 application topically 2 (two) times daily as needed (for rash). , Disp: , Rfl:    dexlansoprazole (DEXILANT) 60 MG capsule, Take 1 capsule (60 mg total) by mouth daily., Disp: 90 capsule, Rfl: 1   dofetilide (TIKOSYN) 500 MCG capsule, Take 1 capsule (500 mcg total) by mouth 2 (two) times daily., Disp: 180 capsule, Rfl: 3   ELIQUIS 5 MG TABS tablet, TAKE 1 TABLET BY MOUTH TWICE A DAY, Disp: 60 tablet, Rfl: 5   erythromycin ophthalmic  ointment, , Disp: , Rfl:    famotidine (PEPCID) 40 MG tablet, Take 1 tablet (40 mg total) by mouth daily., Disp: 30 tablet, Rfl: 11   fluticasone (FLONASE) 50 MCG/ACT nasal spray, Place into both nostrils., Disp: , Rfl:    gabapentin (NEURONTIN) 100 MG capsule, Take 1-2 capsules (100-200 mg total) by mouth See admin instructions. Patient takes 1 tablet in the morning and 2 tablets at night, Disp: 60 capsule, Rfl: 3   ipratropium (ATROVENT) 0.03 % nasal spray, Place 2 sprays into both nostrils every 12 (twelve) hours., Disp: 30 mL, Rfl: 12   loratadine (CLARITIN) 10 MG tablet, Take 10 mg by mouth daily., Disp: , Rfl:    methimazole (TAPAZOLE) 10 MG tablet, Take 1 tablet (10 mg total) by mouth daily.,  Disp: 30 tablet, Rfl: 1   olmesartan (BENICAR) 20 MG tablet, Take 20 mg by mouth daily., Disp: , Rfl:    oxyCODONE 10 MG TABS, Take 1 tablet (10 mg total) by mouth every 4 (four) hours as needed for severe pain ((score 7 to 10))., Disp: 30 tablet, Rfl: 0   oxyCODONE-acetaminophen (PERCOCET) 10-325 MG tablet, Take 1 tablet by mouth 4 (four) times daily as needed., Disp: , Rfl:    sucralfate (CARAFATE) 1 GM/10ML suspension, Take 10 mLs (1 g total) by mouth 4 (four) times daily -  with meals and at bedtime., Disp: 420 mL, Rfl: 0   Tiotropium Bromide Monohydrate (SPIRIVA RESPIMAT) 2.5 MCG/ACT AERS, Inhale 2 puffs into the lungs daily., Disp: 4 g, Rfl: 0   triamcinolone ointment (KENALOG) 0.1 %, APPLY MODERATE AMOUNT TO AFFECTED AREA DAILY RASH, Disp: , Rfl:

## 2022-07-29 NOTE — Patient Instructions (Addendum)
We will check on your referral to allergy immunology  We will refer you to speech therapy for evaluation of chronic cough  Continue to use nasal sprays and reflux medicines. Continue to use Claritin daily for allergies.  Follow up with Dr. Annamaria Boots as scheduled

## 2022-07-30 ENCOUNTER — Encounter: Payer: Self-pay | Admitting: Pulmonary Disease

## 2022-08-02 ENCOUNTER — Other Ambulatory Visit: Payer: Self-pay | Admitting: Nurse Practitioner

## 2022-08-02 DIAGNOSIS — J3089 Other allergic rhinitis: Secondary | ICD-10-CM

## 2022-08-07 DIAGNOSIS — R3129 Other microscopic hematuria: Secondary | ICD-10-CM | POA: Diagnosis not present

## 2022-08-07 DIAGNOSIS — Z6841 Body Mass Index (BMI) 40.0 and over, adult: Secondary | ICD-10-CM | POA: Diagnosis not present

## 2022-08-07 DIAGNOSIS — R3989 Other symptoms and signs involving the genitourinary system: Secondary | ICD-10-CM | POA: Diagnosis not present

## 2022-08-07 DIAGNOSIS — N528 Other male erectile dysfunction: Secondary | ICD-10-CM | POA: Diagnosis not present

## 2022-08-12 ENCOUNTER — Other Ambulatory Visit: Payer: Self-pay

## 2022-08-12 ENCOUNTER — Encounter: Payer: Self-pay | Admitting: Speech Pathology

## 2022-08-12 ENCOUNTER — Ambulatory Visit: Payer: Medicaid Other | Attending: Pulmonary Disease | Admitting: Speech Pathology

## 2022-08-12 DIAGNOSIS — R498 Other voice and resonance disorders: Secondary | ICD-10-CM | POA: Insufficient documentation

## 2022-08-12 NOTE — Patient Instructions (Addendum)
We make about a 2 liter bottle of mucus, phlegm, and saliva everyday  We swallow about 1x a minute to manage this  You have gotten into a habit of hocking, this irritates your throat and vocal folds, so your body sends phlegm to soothe your throat, then you clear it out and irritate more - this is a cycle you need to break   ABDOMINAL BREATHING FOR CHRONIC COUGH   Shoulders down - this is a cue to relax Place your hand on your abdomen - this helps you focus on easy abdominal breath support - the best and most relaxed way to breathe Breathe in through your nose and fill your belly with air, watching your hand move outward Breathe out through your mouth and watch your belly move in. An audible "sh"  may help   Think of your belly as a balloon, when you fill with air (inhale), the balloon gets bigger. As the air goes out (exhale), the balloon deflates.  If you are having difficulty coordinating this, lay on your back with a plastic cup on your belly and repeat the above steps, watching you belly move up with inhalation and down with exhalations  Practice breathing in and out in front of a mirror, watching your belly Breathe in for a count of 5 and breathe out for a count of 5  Now as you breathe out, get a picture of relaxing in your mind Feel the constant in-out of your breathing with your belly Picture the tension in your throat and chest evaporate like steam, melting away and FEEL it do so Picture your throat opening up so wide that a grapefruit or softball could fit through your throat.   Practice this throughout the day when you are not having symptoms. For example: in the car, when watching TV, before medications. Regular practice when you are feeling well is important.  Make it automatic and use it at the first sense of throat tightness  or mucus to prevent or suppress the cough    Be patient when completing the breathing. It may take several minutes to start feeling  relief  Use the breathing to "pre-treat" yourself before a known trigger for cough. Possible triggers could be: change in air temperature, strong odors, perfume, and exercise.   There's an App for that: Breathe2relax  Provided by: [name]. SLP [phone]   ACID REFLUX PRECAUTIONS ACID REFLUX can be a possible cause of a voice disorder or throat irritation. Acid reflux is a disorder where acid from your stomach is abnormally spilled over onto your voice box after eating, during sleep, or even during singing. Acid reflux causes irritation and inflammation to your vocal folds and should be avoided and treated by changing eating habits, changing lifestyle, and taking medication (if prescribed by your doctor).   CHANGE EATING HABITS   Avoid "trigger" foods. Certain foods and drinks can trigger acid reflux.  1. Caffeine- in coffee, tea, chocolate, sodas  2. Carbonated beverages  3. Mint and menthol  4. Fatty/fried foods  5. Citrus fruits  6. Tomato products  7. Spicy foods  8. Alcohol    CHANGING LIFESTYLE HABITS   Drink 8 glasses of water per day (64oz)    Stop smoking   Avoid clearing your throat   Allow 3 hours between last big meal and going to bed at night   Keep yourself upright for one hour after you eat   Elevate the head of your bed using  6-inch blocks under the head of the bed or a bed wedge between the box spring and the mattress.   Eat small meals throughout the day rather than 3 big meals   Eat slowly   Wear loose clothing   TAKING MEDICATION   If prescribed one time a day, take 15-30 minutes before breakfast   If prescribed two times a day, take 15-30 minutes before breakfast and 15- 30 minutes before dinner   CHANGING THE WAY YOU USE YOUR VOICE  "Best voice/ Least effort"

## 2022-08-13 DIAGNOSIS — M47812 Spondylosis without myelopathy or radiculopathy, cervical region: Secondary | ICD-10-CM | POA: Diagnosis not present

## 2022-08-17 ENCOUNTER — Ambulatory Visit: Payer: Medicaid Other | Admitting: Speech Pathology

## 2022-08-17 NOTE — Therapy (Addendum)
OUTPATIENT SPEECH LANGUAGE PATHOLOGY SPEECH/VOICE EVALUATION   Patient Name: [name] MRN: [id number] DOB:Jan 08, 1961, 61 y.o., male Today's Date: 08/17/2022  PCP: [place] PROVIDER: Freddi Starr, MD  END OF SESSION:  End of Session - 08/17/22 0757     Visit Number 1    Number of Visits 8    Date for SLP Re-Evaluation 10/12/22    SLP Start Time 0930    SLP Stop Time  3762    SLP Time Calculation (min) 45 min    Activity Tolerance Patient tolerated treatment well             Past Medical History:  Diagnosis Date   Atrial fibrillation ([place])    s/p afib ablation  PVI 5/12 JA   CAD -nonobstructive    S/P nstemi-type II cath 05/2008 - nonobs. dzs.   Cataract    left eye 2019   Colon polyps    Complication of anesthesia    last 2 surgeries pt had some respiratory issues following surgery required ED visit, dx with bronchitis.    DDD (degenerative disc disease)    evaluate by neurosurgery is ongoing   Depression    ptsd   Duodenal adenoma    Erectile dysfunction    GERD (gastroesophageal reflux disease)    Headache(784.0)    Hemorrhoids    Hiatal hernia    HTN (hypertension)    Hyperlipemia    Hyperthyroidism    Graves dz on methimazole and followed by [name]   Myocardial infarction [facility])    2009   Obesity    Pancreatitis    Polysubstance abuse ([place])    cocaine, last used 1999   Presence of permanent cardiac pacemaker    since 2009   PUD (peptic ulcer disease)    gastritis due to ETOH previously   Pulmonary embolism ([place]) 04/2003   Hx of   Rectal bleeding    Sinus node dysfunction (HCC)    s/p MDT PPM - AAI - V port plugged   Sleep apnea    uses CPAP [facility] 2 YRS   Systolic and diastolic CHF, chronic (HCC)    EF 35% by most recent echo 09/19/10   Tooth absence 2014   recent ly had tooth pulled painful   Past Surgical History:  Procedure Laterality Date   ANTERIOR CERVICAL DECOMP/DISCECTOMY FUSION   12/15/2011   Procedure: ANTERIOR CERVICAL DECOMPRESSION/DISCECTOMY FUSION 1 LEVEL;  Surgeon: [name], MD;  [facility]: [facility];  Service: Neurosurgery;  Laterality: N/A;  Cervical Six-Seven Anterior Cervical Diskectomy fusion with Allograft and Plating   ANTERIOR CERVICAL DECOMP/DISCECTOMY FUSION N/A 01/01/2020   Procedure: Anterior Cervical Discectomy Fusion - Cervical Seven- Thoracic One;  Surgeon: Earnie Larsson, MD;  Location: [place];  Service: Neurosurgery;  Laterality: N/A;  Anterior Cervical Discectomy Fusion - Cervical Seven- Thoracic One   ATRIAL ABLATION SURGERY  12/30/2010   afib ablation by [name]   ATRIAL FIBRILLATION ABLATION N/A 05/06/2017   Procedure: Atrial Fibrillation Ablation;  Surgeon: Thompson Grayer, MD;  Location: [place] CV LAB;  Service: Cardiovascular;  Laterality: N/A;   BACK SURGERY     CARDIAC CATHETERIZATION     CADIAC ABLATION [name] 12/2010   CARDIAC CATHETERIZATION N/A 05/08/2016   Procedure: Left Heart Cath and Coronary Angiography;  Surgeon: Peter M Martinique, MD;  Location: [place] CV [facility];  Service: Cardiovascular;  Laterality: N/A;   Fingers removed from right hand.  10/01/1982  traumatic work injury   [place] / REPLACE / REMOVE PACEMAKER     05/2008     KNEE ARTHROSCOPY Left 06/03/2021   LUMBAR SPINE SURGERY  02/28/2009   Dr. Trenton Gammon   PACEMAKER INSERTION  05/31/2008   by Dr [name]   TEE WITHOUT CARDIOVERSION N/A 05/05/2017   Procedure: TRANSESOPHAGEAL ECHOCARDIOGRAM (TEE);  Surgeon: Larey Dresser, MD;  Location: [facility]COPY;  Service: Cardiovascular;  Laterality: N/A;   Patient Active Problem List   Diagnosis Date Noted   NICM (nonischemic cardiomyopathy) ([place]) 07/21/2022   Upper airway cough syndrome 10/30/2021   Degenerative spondylolisthesis 10/07/2020   Cervical spondylosis with myelopathy and radiculopathy 01/01/2020   Acute maxillary sinusitis 09/12/2018   Paroxysmal atrial fibrillation ([place]) 05/06/2017   Acute on chronic  diastolic congestive heart failure, NYHA class 4 ([place]) 04/03/2017   Back pain 11/12/2015   Scrotal varicose veins 07/11/2015   Varicocele 07/11/2015   High risk medication use - Tikosyn 09/16/2014   Chronic anticoagulation-Pradaxa 09/13/2014   Muscle twitch 09/11/2014   Leg cramps 09/11/2014   Irritant contact dermatitis 10/03/2013   Eunuchoidism 10/03/2013   Hypogonadism male 10/03/2013   Mixed, or nondependent drug abuse, in remission ([place]) 09/13/2013   Substance abuse in remission ([place]) 09/13/2013   Spinal stenosis, lumbar region, with neurogenic claudication 05/23/2013   CAD -nonobstructive    Preoperative cardiovascular examination 11/17/2012   Pulmonary embolism ([place]) 09/02/2012   Acid reflux 09/02/2012   Sinus node dysfunction ([place]) 09/02/2012   Adiposity 02/12/2012   Obesity 02/12/2012   Cervical disc herniation 12/15/2011   Allergic rhinitis 09/21/2011   Atrial fibrillation with RVR ([place]) 07/08/2011   Discomfort 06/19/2011   Male erectile dysfunction 46/96/2952   Lichen planus 84/13/2440   Decreased libido 06/19/2011   Testicular discomfort 06/19/2011   Essential hypertension 04/14/2011   Dermatitis, eczematoid 02/26/2011   Eczema 02/26/2011   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   Degeneration of intervertebral disc of lumbosacral region 01/06/2011   Chronic atrial fibrillation ([place]) 01/06/2011   DDD (degenerative disc disease), lumbosacral 01/06/2011   Graves disease 10/30/2010   CARPAL TUNNEL SYNDROME, LEFT 10/16/2010   SICK SINUS/ TACHY-BRADY SYNDROME 10/16/2010   Ischemic cardiomyopathy 09/21/2010   HLD (hyperlipidemia) 09/16/2010   PPM-Medtronic 06/27/2010   DYSPNEA 06/06/2010   Abnormal cardiac CT angiography 06/06/2010   Seasonal and perennial allergic rhinitis 02/05/2010   OTHER CHEST PAIN 11/27/2009   ERECTILE DYSFUNCTION, ORGANIC 07/18/2009   OSA (obstructive sleep apnea) 07/08/2009   COMBINED HEART FAILURE, CHRONIC 05/30/2009   LUMBAR RADICULOPATHY, RIGHT  11/05/2008   Hiatal hernia 07/04/2008   HYPERTHYROIDISM 08/30/2007   Dyslipidemia 08/30/2007   ALCOHOL ABUSE 08/30/2007   DEPRESSION 08/30/2007   PAF (paroxysmal atrial fibrillation) ([place]) 08/30/2007   GERD (gastroesophageal reflux disease) 08/30/2007   PULMONARY EMBOLISM, HX OF 08/30/2007   DRUG ABUSE, HX OF 08/30/2007    ONSET DATE: 07/29/22 (referral date)  REFERRING DIAG: R05.3 (ICD-10-CM) - Chronic cough  THERAPY DIAG:  Other voice and resonance disorders  Rationale for Evaluation and Treatment: Rehabilitation  SUBJECTIVE:   SUBJECTIVE STATEMENT: "I cough and bring up phlegm all the time" Pt accompanied by: self  PERTINENT HISTORY: Pulmonology report/CT:61 year old male, former smoker. PMH significant for CHF, afib, HTN, cardiomyopathy, pulmonary embolism, OSA, allergic rhinitis, GERD, hyperthyroidism, Graves disease, UACS. Patient of Dr. Annamaria Boots, last seen on 05/14/22.   He has chronic cough that is not responsive to sinus treatment, GERD treatment and antihistamine treatment for allergies or LAMA inhaler therapy. He has chronic throat  congestion. We will place referral for allergy evaluation based on prior recommendations by Dr. Annamaria Boots. We will refer patient to speech therapy for further evaluation of his chronic cough and whether modified barium swallow is needed. Saw Dr. Redmond Baseman, ENT July 2023 for cough/post nasal drip/sinus issues: No inflammatory sinus disease presently. Retention cyst at the floor of the left maxillary sinus. No widespread mucosal thickening or layering fluid. Irl reports he has been taking PPI, Claritin, Mucinex without relief of cough. He is concerned about allergies as well.    PAIN:  Are you having pain? Yes: NPRS scale: 7/10 Pain location: shoulders, neck Pain description: ache Aggravating factors: movement Relieving factors: rest  FALLS: Has patient fallen in last 6 months?  No  LIVING ENVIRONMENT: Lives with: lives alone Lives in:  House/apartment  PLOF:  Level of assistance: Independent with ADLs, Independent with IADLs Employment: On disability, Retired  PATIENT GOALS: "To stop coughing and brining up mucus"  OBJECTIVE:   DIAGNOSTIC FINDINGS: Lungs/Pleura: Lungs are clear. No pleural effusion or pneumothorax  COGNITION: Overall cognitive status: Within functional limits for tasks assessed  Comments: WFL  MOTOR SPEECH: Overall motor speech: Appears intact   ORAL MOTOR EXAMINATION: Overall status: WFL  OBJECTIVE VOICE ASSESSMENT: Sustained "ah" loudness average: 72 dB Conversational loudness average: 72 dB   Pt does not report difficulty with swallowing which does not warrant further evaluation.  PATIENT REPORTED OUTCOME MEASURES (PROM): Cough Severity Index: 5/44 - He rated "0 " or "never" a problem, for all but rated 4, or always a problem to: cough being worse when he lays down, and my coughing problem upsets me"  TODAY'S TREATMENT:                                                                                                                                         DATE:   08/10/22  - eval day: Introduced cough suppression strategies, including abdominal sniff/blow, hard swallows, and sips. Education provided re: cyclical nature of cough, hocking causing more phlegm in the pharynx and larynx due to irritation from cough and throat clears. Reviewed CT scans and CXR that sinuses and lungs are clear. He feels a lump in his throat he wants to clear, but has no breathing difficulty when he senses this. This week he will focus on strategies to suppress cough. Reflux precautions reviewed, [place] endorses he does eat pm meal then immediate goes to bed. We discussed elevating HOB as well. He coughs with CPAP, initiated training in sniff/blow when falling asleep.   PATIENT EDUCATION: Education details: See Treatment and Pt instructions Person educated: Patient Education method: Explanation, Demonstration,  Verbal cues, and Handouts Education comprehension: verbalized understanding, verbal cues required, and needs further education  HOME EXERCISE PROGRAM: Cough suppression strategies, avoid throat clearing   GOALS: Goals reviewed with patient? Yes   LONG TERM GOALS: Target date: 10/12/21  Pt will demonstrate 2  strategies for cough suppression with rare min A  Baseline: No strategies, coughing, hocking, expectorating through out the day Goal status: INITIAL  2.  Pt will eliminate habitual throat clearing with rare min A Baseline: habitual throat clearing frequently Goal status: INITIAL  3.  Pt will report 50% reduction (subjectively) on chronic cough and expectorating over 1 week Baseline: rate cough "always" upsets him Goal status: INITIAL  4.  Pt will eliminate use of mucinex and possibly Claritin  Baseline: taking mucinex every night before bed Goal status: INITIAL  5.  Follow 3 reflux precautions, ideally not eating before bed, not laying down after eating and raising HOB Baseline: no diet modifications or behavioral reflux precautions Goal status: INITIAL    ASSESSMENT:  CLINICAL IMPRESSION: Patient is a 61 y.o. male who was seen today for chronic cough. Azure has seen ENT and pulmonology for chronic cough with expectoration.  Both specialties found lungs and sinuses to be clear. He is taking reflux meds, allergy meds and cold meds without relief. He is coughing, hocking and expectorating frequently throughout the day, cause embarrassment and frustration. He feels a lump in his throat and is constantly trying to clear it. He endorses frequent throat clearing, and this was observed during evaluation 5x. Coughing at night on CPAP is consistent and he takes Mucinex every night, however this has not consistently offered relief. Luz endorses reflux but is not using any diet or behavioral reflux precautions, PPI not providing relief from cough. His voice is WNL without obvious  tension. [place] would like to wean off of some allergy and cold medications. He was able to suppress cough and throat clear 3x with cues for abdominal snifif/blow, hard swallow and sip. He still feels globus sensation, but is able to breath easily.  He denies any difficulty swallowing. Educated him that chronic cough can become a habit and a cycle of frequent coughing irritates his pharynx and larynx and results in more phlegm being sent to his throat. I recommend a short course of ST to eliminate chronic cough for QOL, hygiene and wellness.   OBJECTIVE IMPAIRMENTS: Objective impairments include  chronic cough/irritable larynx . These impairments are limiting patient from effectively communicating at home and in community and result in unhygienic behavior of consistently expectorating saliva and phlegm. .Factors affecting potential to achieve goals and functional outcome are  none .[name] Patient will benefit from skilled SLP services to address above impairments and improve overall function.  REHAB POTENTIAL: Good  PLAN:  SLP FREQUENCY: 1x/week  SLP DURATION: 8 weeks  PLANNED INTERVENTIONS: Environmental controls, Cueing hierachy, Internal/external aids, Functional tasks, SLP instruction and feedback, and Compensatory strategies, cough suppression techniques   Check all possible CPT codes: [phone] - SLP treatment    Check all conditions that are expected to impact treatment: None of these apply   If treatment provided at initial evaluation, no treatment charged due to lack of authorization.      Dezeray Puccio, Annye Rusk, CCC-SLP 08/17/2022, 7:58 AM

## 2022-08-19 ENCOUNTER — Ambulatory Visit: Payer: Medicaid Other | Attending: Internal Medicine | Admitting: Internal Medicine

## 2022-08-19 ENCOUNTER — Encounter: Payer: Self-pay | Admitting: Internal Medicine

## 2022-08-19 VITALS — BP 148/96 | HR 63 | Ht 71.0 in | Wt 299.0 lb

## 2022-08-19 DIAGNOSIS — I4819 Other persistent atrial fibrillation: Secondary | ICD-10-CM | POA: Diagnosis not present

## 2022-08-19 DIAGNOSIS — I5022 Chronic systolic (congestive) heart failure: Secondary | ICD-10-CM | POA: Diagnosis not present

## 2022-08-19 DIAGNOSIS — Z79899 Other long term (current) drug therapy: Secondary | ICD-10-CM | POA: Diagnosis not present

## 2022-08-19 DIAGNOSIS — I495 Sick sinus syndrome: Secondary | ICD-10-CM | POA: Diagnosis not present

## 2022-08-19 NOTE — Progress Notes (Signed)
Patient Care Team: [facility], [place] as PCP - Bethena Roys, MD as PCP - Cardiology (Cardiology) Thompson Grayer, MD as PCP - Electrophysiology (Cardiology) Sable Feil, MD (Gastroenterology) Albina Billet as Physician Assistant (Neurosurgery) Clydell Hakim, MD (Inactive) as Consulting Physician (Anesthesiology)   HPI  [name] is a 61 y.o. male Seen in followup for pacemaker--dual-chamber with a ventricular port plugged  implanted for sinus node dysfunction and paroxysmal atrial arrhythmias which persisted despite dofetilide. He underwent catheter ablation of atrial fibrillation 2012. He had been on dofetilide stopped March 2014 subsequently resumed; repeat ablation 9/18 (Dr. Greggory Brandy) maintained on dofetilide  History in the past of treated hyperthyroidism  Ischemic heart disease with prior non-STEMI possibly type II as there was  no significant obstructive lesions noted. Catheterization was done in 2006 and 2009  showing nonobstructive disease. A Myoview scan 2011 was nonischemic.  Hx of hypothyroidism, CT 5/23>> abnormal thyroid, U/S recommended not done   [facility]   History of pulmonary embolism     The patient denies chest pain, shortness of breath, nocturnal dyspnea, orthopnea or peripheral edema.  There have been no palpitations, lightheadedness or syncope.  Complains of difficulty with CPAP and phlegm.   DATE TEST EF   11*/12 Echo  45-50 %   8/17    Echo  45-50 %   9/17 CATH 45% No obstructive CAD  3/22 Echo  60-65%   5/23      Date Cr K Mg TSH Hgb  8/18    0.42-0.297  17.1>14.9  3/20 0.94 4.6 2.2     5/23 1.07 4.0   14.0     Past Medical History:  Diagnosis Date   Atrial fibrillation [facility])    s/p afib ablation  PVI 5/12 JA   CAD -nonobstructive    S/P nstemi-type II cath 05/2008 - nonobs. dzs.   Cataract    left eye 2019   Colon polyps    Complication of anesthesia    last 2 surgeries pt had some  respiratory issues following surgery required ED visit, dx with bronchitis.    DDD (degenerative disc disease)    evaluate by neurosurgery is ongoing   Depression    ptsd   Duodenal adenoma    Erectile dysfunction    GERD (gastroesophageal reflux disease)    Headache(784.0)    Hemorrhoids    Hiatal hernia    HTN (hypertension)    Hyperlipemia    Hyperthyroidism    Graves dz on methimazole and followed by [name]   Myocardial infarction [facility])    2009   Obesity    Pancreatitis    Polysubstance abuse ([place])    cocaine, last used 1999   Presence of permanent cardiac pacemaker    since 2009   PUD (peptic ulcer disease)    gastritis due to ETOH previously   Pulmonary embolism ([place]) 04/2003   Hx of   Rectal bleeding    Sinus node dysfunction (HCC)    s/p MDT PPM - AAI - V port plugged   Sleep apnea    uses CPAP [facility] SLEEP [facility] 2 YRS   Systolic and diastolic CHF, chronic (HCC)    EF 35% by most recent echo 09/19/10   Tooth absence 2014   recent ly had tooth pulled painful    Past Surgical History:  Procedure Laterality Date   ANTERIOR CERVICAL DECOMP/DISCECTOMY FUSION  12/15/2011   Procedure: ANTERIOR CERVICAL DECOMPRESSION/DISCECTOMY FUSION 1 LEVEL;  Surgeon: [name], MD;  Location: [place] [facility] ORS;  Service: Neurosurgery;  Laterality: N/A;  Cervical Six-Seven Anterior Cervical Diskectomy fusion with Allograft and Plating   ANTERIOR CERVICAL DECOMP/DISCECTOMY FUSION N/A 01/01/2020   Procedure: Anterior Cervical Discectomy Fusion - Cervical Seven- Thoracic One;  Surgeon: Earnie Larsson, MD;  Location: [place];  Service: Neurosurgery;  Laterality: N/A;  Anterior Cervical Discectomy Fusion - Cervical Seven- Thoracic One   ATRIAL ABLATION SURGERY  12/30/2010   afib ablation by [name]   ATRIAL FIBRILLATION ABLATION N/A 05/06/2017   Procedure: Atrial Fibrillation Ablation;  Surgeon: Thompson Grayer, MD;  Location: [place] CV LAB;  Service: Cardiovascular;  Laterality: N/A;   BACK  SURGERY     CARDIAC CATHETERIZATION     CADIAC ABLATION [name] 12/2010   CARDIAC CATHETERIZATION N/A 05/08/2016   Procedure: Left Heart Cath and Coronary Angiography;  Surgeon: Peter M Martinique, MD;  Location: [place] [facility] LAB;  Service: Cardiovascular;  Laterality: N/A;   Fingers removed from right hand.  10/01/1982   traumatic work injury   [place] / REPLACE / REMOVE PACEMAKER     05/2008     KNEE ARTHROSCOPY Left 06/03/2021   LUMBAR SPINE SURGERY  02/28/2009   Dr. Trenton Gammon   PACEMAKER INSERTION  05/31/2008   by Dr [name]   TEE WITHOUT CARDIOVERSION N/A 05/05/2017   Procedure: TRANSESOPHAGEAL ECHOCARDIOGRAM (TEE);  Surgeon: Larey Dresser, MD;  Location[facility];  Service: Cardiovascular;  Laterality: N/A;    Current Outpatient Medications  Medication Sig Dispense Refill   AMBULATORY NON FORMULARY MEDICATION Medication Name: 90 ML Lidocaine 2%:90 ML Dicyclomine 10 mg/80m:270 ML Maalox-Take 5-10 ml every 4-6 hours as needed. 450 mL 3   atorvastatin (LIPITOR) 40 MG tablet Take 40 mg by mouth daily.     carvedilol (COREG) 25 MG tablet TAKE 1 TABLET BY MOUTH TWICE A DAY WITH MEALS 180 tablet 2   desonide (DESOWEN) 0.05 % cream Apply 1 application topically 2 (two) times daily as needed (for rash).      dexlansoprazole (DEXILANT) 60 MG capsule Take 1 capsule (60 mg total) by mouth daily. 90 capsule 1   dofetilide (TIKOSYN) 500 MCG capsule Take 1 capsule (500 mcg total) by mouth 2 (two) times daily. 180 capsule 3   ELIQUIS 5 MG TABS tablet TAKE 1 TABLET BY MOUTH TWICE A DAY 60 tablet 5   erythromycin ophthalmic ointment      famotidine (PEPCID) 40 MG tablet Take 1 tablet (40 mg total) by mouth daily. 30 tablet 11   fluticasone (FLONASE) 50 MCG/ACT nasal spray Place into both nostrils.     gabapentin (NEURONTIN) 100 MG capsule Take 1-2 capsules (100-200 mg total) by mouth See admin instructions. Patient takes 1 tablet in the morning and 2 tablets at night 60 capsule 3   ipratropium  (ATROVENT) 0.03 % nasal spray Place 2 sprays into both nostrils every 12 (twelve) hours. 30 mL 12   loratadine (CLARITIN) 10 MG tablet TAKE 1 TABLET BY MOUTH EVERY DAY 30 tablet 5   methimazole (TAPAZOLE) 10 MG tablet Take 1 tablet (10 mg total) by mouth daily. 30 tablet 1   olmesartan (BENICAR) 20 MG tablet Take 20 mg by mouth daily.     oxyCODONE-acetaminophen (PERCOCET) 10-325 MG tablet Take 1 tablet by mouth 4 (four) times daily as needed.     sucralfate (CARAFATE) 1 GM/10ML suspension Take 10 mLs (1 g total) by mouth 4 (four) times daily -  with meals  and at bedtime. 420 mL 0   tadalafil (CIALIS) 20 MG tablet Take by mouth.     Tiotropium Bromide Monohydrate (SPIRIVA RESPIMAT) 2.5 MCG/ACT AERS Inhale 2 puffs into the lungs daily. 4 g 0   triamcinolone ointment (KENALOG) 0.1 % APPLY MODERATE AMOUNT TO AFFECTED AREA DAILY RASH     oxyCODONE 10 MG TABS Take 1 tablet (10 mg total) by mouth every 4 (four) hours as needed for severe pain ((score 7 to 10)). 30 tablet 0   No current facility-administered medications for this visit.    Allergies  Allergen Reactions   Celecoxib Itching   Hydrocodone Itching   Prednisolone Itching   Potassium Chloride Other (See Comments)    Dizziness and nausea     Review of Systems negative except from HPI and PMH  Physical Exam BP (!) 142/90   Pulse 63   Ht '5\' 11"'$  (1.803 m)   Wt 299 lb (135.6 kg)   SpO2 95%   BMI 41.70 kg/m   Well developed and Morbidly obese in no acute distress HENT normal Neck supple with JVP-flat Clear Device pocket well healed; without hematoma or erythema.  There is no tethering  Regular rate and rhythm, no  gallop No / murmur Abd-soft with active BS No Clubbing cyanosis  edema Skin-warm and dry A & Oriented  Grossly normal sensory and motor function  ECG 63 17/12/42  Device function is normal. Programming changes none  See Paceart for details       Assessment and  Plan  Syncope   Atrial fibrillation  persistent with rapid conduction  S/p PVI 2102, 2018   Sinus node dysfunction  High Risk Medication Surveillance- Dofetilide   Pacemaker-Medtronic -dual-chamber with ventricular port plugged--AAI   GERD  Treated hyperthyroidism  Tolerating dofetilide, no evidence of interval arrhythmias.  QTc is within range, will check dofetilide surveillance labs.  His thyroid function is being followed by the [place]; CT was reviewed, recommendation was for ultrasound because of heterogeneity; I have given him a copy of the report to take to his thyroid doctor at the [place].  No interval syncope.  Blood pressure is elevated.  Significant problems with pain.  Did not take his meds yet this am  Working on his sleep apnea and CPAP; a lot of nasal congestion.  Suggested Nettie pot

## 2022-08-19 NOTE — Patient Instructions (Addendum)
Medication Instructions:  Your physician recommends that you continue on your current medications as directed. Please refer to the Current Medication list given to you today. *If you need a refill on your cardiac medications before your next appointment, please call your pharmacy*   Lab Work: CBC, BMET and Mg today If you have labs (blood work) drawn today and your tests are completely normal, you will receive your results only by: [place] (if you have MyChart) OR A paper copy in the mail If you have any lab test that is abnormal or we need to change your treatment, we will call you to review the results.   Testing/Procedures: None ordered.    Follow-Up: At [facility], you and your health needs are our priority.  As part of our continuing mission to provide you with exceptional heart care, we have created designated Provider Care Teams.  These Care Teams include your primary Cardiologist (physician) and Advanced Practice Providers (APPs -  Physician Assistants and Nurse Practitioners) who all work together to provide you with the care you need, when you need it.  We recommend signing up for the patient portal called "MyChart".  Sign up information is provided on this After Visit Summary.  MyChart is used to connect with patients for Virtual Visits (Telemedicine).  Patients are able to view lab/test results, encounter notes, upcoming appointments, etc.  Non-urgent messages can be sent to your provider as well.   To learn more about what you can do with MyChart, go to [email or web address].    Your next appointment:   12 months with Dr [name]  Important Information About Sugar

## 2022-08-20 LAB — MAGNESIUM: Magnesium: 2.2 mg/dL (ref 1.6–2.3)

## 2022-08-20 LAB — BASIC METABOLIC PANEL
BUN/Creatinine Ratio: 17 (ref 10–24)
BUN: 19 mg/dL (ref 8–27)
CO2: 24 mmol/L (ref 20–29)
Calcium: 9.3 mg/dL (ref 8.6–10.2)
Chloride: 105 mmol/L (ref 96–106)
Creatinine, Ser: 1.14 mg/dL (ref 0.76–1.27)
Glucose: 96 mg/dL (ref 70–99)
Potassium: 4.9 mmol/L (ref 3.5–5.2)
Sodium: 141 mmol/L (ref 134–144)
eGFR: 72 mL/min/{1.73_m2} (ref >59)

## 2022-08-20 LAB — CBC
Hematocrit: 43.7 % (ref 37.5–51.0)
Hemoglobin: 14.3 g/dL (ref 13.0–17.7)
MCH: 27 pg (ref 26.6–33.0)
MCHC: 32.7 g/dL (ref 31.5–35.7)
MCV: 83 fL (ref 79–97)
Platelets: 253 10*3/uL (ref 150–450)
RBC: 5.29 x10E6/uL (ref 4.14–5.80)
RDW: 15.6 % — ABNORMAL HIGH (ref 11.6–15.4)
WBC: 4.9 10*3/uL (ref 3.4–10.8)

## 2022-08-25 DIAGNOSIS — G4733 Obstructive sleep apnea (adult) (pediatric): Secondary | ICD-10-CM | POA: Diagnosis not present

## 2022-08-26 ENCOUNTER — Ambulatory Visit: Payer: Medicaid Other | Admitting: Speech Pathology

## 2022-09-03 ENCOUNTER — Encounter: Payer: Medicaid Other | Admitting: Speech Pathology

## 2022-09-09 ENCOUNTER — Encounter: Payer: Medicaid Other | Admitting: Speech Pathology

## 2022-09-14 DIAGNOSIS — M47812 Spondylosis without myelopathy or radiculopathy, cervical region: Secondary | ICD-10-CM | POA: Diagnosis not present

## 2022-09-16 DIAGNOSIS — M7541 Impingement syndrome of right shoulder: Secondary | ICD-10-CM | POA: Diagnosis not present

## 2022-09-16 DIAGNOSIS — M25511 Pain in right shoulder: Secondary | ICD-10-CM | POA: Diagnosis not present

## 2022-09-16 DIAGNOSIS — M5412 Radiculopathy, cervical region: Secondary | ICD-10-CM | POA: Diagnosis not present

## 2022-09-16 DIAGNOSIS — G8929 Other chronic pain: Secondary | ICD-10-CM | POA: Diagnosis not present

## 2022-09-21 NOTE — Progress Notes (Unsigned)
NEW PATIENT Date of Service/Encounter:  09/23/22 Referring provider: Freddi Starr, MD Primary care provider: [facility]  Subjective:  [name] is a 62 y.o. male with a PMHx of Graves' disease, lichen planus, based out disease, eczema, carpal tunnel syndrome, history of pulmonary embolism with CHD, OSA, PAF, HLD, upper airway cough syndrome, presenting today for evaluation of chronic rhinitis. History obtained from: chart review and patient.   Chronic rhinitis: started 2 years ago Has tried antibiotics-didn't help at all, currently taking claritin. Using atrovent and fluticasone. Using CPAP, was on full face mask, but started having paranasal pain soreness, switched to oral mask only. ENT has seen him 3 times, told once CPAP was causing issue Initially when starting CPAP, he would cough until he would eventually fall asleep Now using mucinex nightly which helps with cough and phlegm build-up overnight. On awakening every morning, he first goes to bathroom to clear his throat and cough up phlegm. He does this multiple times throughout the day. He has had a rhinoscopy-he was told it was normal He feels phlegm stuck in the back of his throat He has tried so hard to get phlegm out that he will get a sore throat He is not using saline rinses He does take gabapentin 100 mg in AM and 300 mg in PM.  He has been on this for many years for pain-since 2013.  He was not sick when these symptoms started 2 years ago. He does feel his weight jhas worsened his symptoms. He has increased around 100 lbs in the past 2 years.  50 lbs were in the past 8 months. He is taking dexilant for reflux and is followed by GI.  He does have a hiatal hernia found on recent endoscopy, no polyps.  He also takes narcotics (percocet) daily in the morning.  Occurs  year round, no seasonality  Previous allergy testing: no  Previous sinus, ear, tonsil, adenoid surgeries: no   Patient of Dr.  Annamaria Boots.  Last PCP visit 07/29/2029-complaining of mucus in throat leading to cough.  On today's lansoprazole daily for GERD.  Using Atrovent and fluticasone.  Evaluated by ENT with normal CT sinus.  Using sinus rinses.  On CPAP for OSA.  CTA 01/26/22: 1. No evidence for pulmonary embolism. 2. No acute cardiopulmonary process. 3. Mild cardiomegaly. 4. Incidental heterogeneous and enlarged thyroid. Recommend thyroid US  LV Dr. Annamaria Boots 05/14/2022-has been evaluated by ENT with reported normal CT sinuses.  Using irritants in the home.  Home being examined for mold.  Using Flovent, saline and ipratropium nasal sprays.  Recommend referral to allergy. 04/09/2022-normal [place] 04/09/22:  Spirometry >> FVC 3.1 (75%), FEV1 2.8 (86%), ratio 89    Other allergy screening: Asthma: no Rhino conjunctivitis: no Food allergy: no Eczema:no   Past Medical History: Past Medical History:  Diagnosis Date   Atrial fibrillation ([place])    s/p afib ablation  PVI 5/12 JA   CAD -nonobstructive    S/P nstemi-type II cath 05/2008 - nonobs. dzs.   Cataract    left eye 2019   Colon polyps    Complication of anesthesia    last 2 surgeries pt had some respiratory issues following surgery required ED visit, dx with bronchitis.    DDD (degenerative disc disease)    evaluate by neurosurgery is ongoing   Depression    ptsd   Duodenal adenoma    Erectile dysfunction    GERD (gastroesophageal reflux disease)    Headache(784.0)  Hemorrhoids    Hiatal hernia    HTN (hypertension)    Hyperlipemia    Hyperthyroidism    Graves dz on methimazole and followed by [name]   Myocardial infarction [facility])    2009   Obesity    Pancreatitis    Polysubstance abuse ([place])    cocaine, last used 1999   Presence of permanent cardiac pacemaker    since 2009   PUD (peptic ulcer disease)    gastritis due to ETOH previously   Pulmonary embolism ([place]) 04/2003   Hx of   Rectal bleeding    Sinus node dysfunction (HCC)    s/p  MDT PPM - AAI - V port plugged   Sleep apnea    uses CPAP [facility] 2 YRS   Systolic and diastolic CHF, chronic (HCC)    EF 35% by most recent echo 09/19/10   Tooth absence 2014   recent ly had tooth pulled painful   Medication List:  Current Outpatient Medications  Medication Sig Dispense Refill   AMBULATORY NON FORMULARY MEDICATION Medication Name: 90 ML Lidocaine 2%:90 ML Dicyclomine 10 mg/38m:270 ML Maalox-Take 5-10 ml every 4-6 hours as needed. 450 mL 3   atorvastatin (LIPITOR) 40 MG tablet Take 40 mg by mouth daily.     carvedilol (COREG) 25 MG tablet TAKE 1 TABLET BY MOUTH TWICE A DAY WITH MEALS 180 tablet 2   desonide (DESOWEN) 0.05 % cream Apply 1 application topically 2 (two) times daily as needed (for rash).      dexlansoprazole (DEXILANT) 60 MG capsule Take 1 capsule (60 mg total) by mouth daily. 90 capsule 1   diclofenac Sodium (VOLTAREN) 1 % GEL Apply 4 g topically 4 (four) times daily.     dofetilide (TIKOSYN) 500 MCG capsule Take 1 capsule (500 mcg total) by mouth 2 (two) times daily. 180 capsule 3   ELIQUIS 5 MG TABS tablet TAKE 1 TABLET BY MOUTH TWICE A DAY 60 tablet 5   famotidine (PEPCID) 40 MG tablet Take 1 tablet (40 mg total) by mouth daily. 30 tablet 11   fluticasone (FLONASE) 50 MCG/ACT nasal spray Place into both nostrils.     gabapentin (NEURONTIN) 100 MG capsule Take 1-2 capsules (100-200 mg total) by mouth See admin instructions. Patient takes 1 tablet in the morning and 2 tablets at night 60 capsule 3   ipratropium (ATROVENT) 0.03 % nasal spray Place 2 sprays into both nostrils every 12 (twelve) hours. 30 mL 12   loratadine (CLARITIN) 10 MG tablet TAKE 1 TABLET BY MOUTH EVERY DAY 30 tablet 5   methimazole (TAPAZOLE) 10 MG tablet Take 1 tablet (10 mg total) by mouth daily. 30 tablet 1   methimazole (TAPAZOLE) 5 MG tablet Take 1 tablet by mouth daily.     olmesartan (BENICAR) 20 MG tablet Take 20 mg by mouth daily.     oxyCODONE-acetaminophen (PERCOCET) 10-325  MG tablet Take 1 tablet by mouth 4 (four) times daily as needed.     sucralfate (CARAFATE) 1 GM/10ML suspension Take 10 mLs (1 g total) by mouth 4 (four) times daily -  with meals and at bedtime. 420 mL 0   tadalafil (CIALIS) 20 MG tablet Take by mouth.     triamcinolone ointment (KENALOG) 0.1 % APPLY MODERATE AMOUNT TO AFFECTED AREA DAILY RASH     No current facility-administered medications for this visit.   Known Allergies:  Allergies  Allergen Reactions   Celecoxib Itching   Hydrocodone Itching  Prednisolone Itching   Potassium Chloride Other (See Comments)    Dizziness and nausea    Past Surgical History: Past Surgical History:  Procedure Laterality Date   ANTERIOR CERVICAL DECOMP/DISCECTOMY FUSION  12/15/2011   Procedure: ANTERIOR CERVICAL DECOMPRESSION/DISCECTOMY FUSION 1 LEVEL;  Surgeon: [name], MD;  Location: [facility];  Service: Neurosurgery;  Laterality: N/A;  Cervical Six-Seven Anterior Cervical Diskectomy fusion with Allograft and Plating   ANTERIOR CERVICAL DECOMP/DISCECTOMY FUSION N/A 01/01/2020   Procedure: Anterior Cervical Discectomy Fusion - Cervical Seven- Thoracic One;  Surgeon: Earnie Larsson, MD;  Location: [facility];  Service: Neurosurgery;  Laterality: N/A;  Anterior Cervical Discectomy Fusion - Cervical Seven- Thoracic One   ATRIAL ABLATION SURGERY  12/30/2010   afib ablation by [name]   ATRIAL FIBRILLATION ABLATION N/A 05/06/2017   Procedure: Atrial Fibrillation Ablation;  Surgeon: Thompson Grayer, MD;  Location: [place] [facility];  Service: Cardiovascular;  Laterality: N/A;   BACK SURGERY     CARDIAC CATHETERIZATION     CADIAC ABLATION [name] 12/2010   CARDIAC CATHETERIZATION N/A 05/08/2016   Procedure: Left Heart Cath and Coronary Angiography;  Surgeon: Peter M Martinique, MD;  Location: [facility];  Service: Cardiovascular;  Laterality: N/A;   Fingers removed from right hand.  10/01/1982   traumatic work injury   [place] / REPLACE / REMOVE  PACEMAKER     05/2008     KNEE ARTHROSCOPY Left 06/03/2021   LUMBAR SPINE SURGERY  02/28/2009   Dr. Trenton Gammon   PACEMAKER INSERTION  05/31/2008   by Dr [name]   TEE WITHOUT CARDIOVERSION N/A 05/05/2017   Procedure: TRANSESOPHAGEAL ECHOCARDIOGRAM (TEE);  Surgeon: Larey Dresser, MD;  Location: [facility];  Service: Cardiovascular;  Laterality: N/A;   Family History: Family History  Problem Relation Age of Onset   Heart disease Mother    Breast cancer Mother    Lung cancer Father    Diabetes Other    Colon cancer Neg Hx    Esophageal cancer Neg Hx    Rectal cancer Neg Hx    Stomach cancer Neg Hx    Social History: [place] lives in a house built 43 years ago, no water damage, carpet in the bedroom, gas heating, central AC, no roaches, not using DM protection on the bedding or pillows, no smoke exposure, retired, no HEPA filter, smoked from 1981-2004, 1 ppd.   ROS:  All other systems negative except as noted per HPI.  Objective:  Blood pressure 138/70, pulse 70, temperature 98.2 F (36.8 C), temperature source Temporal, resp. rate 16, height '5\' 11"'$  (1.803 m), weight 299 lb 3.2 oz (135.7 kg), SpO2 94 %. Body mass index is 41.73 kg/m. Physical Exam:  General Appearance:  Alert, cooperative, no distress, appears stated age  Head:  Normocephalic, without obvious abnormality, atraumatic  Eyes:  Conjunctiva clear, EOM's intact  Nose: Nares normal, hypertrophic turbinates and normal mucosa  Throat: Lips, tongue normal; teeth and gums normal, normal posterior oropharynx and + cobblestoning  Neck: Supple, symmetrical  Lungs:   clear to auscultation bilaterally, Respirations unlabored, no coughing  Heart:  regular rate and rhythm and no murmur, Appears well perfused  Extremities: No edema  Skin: Skin color, texture, turgor normal, no rashes or lesions on visualized portions of skin  Neurologic: No gross deficits   Diagnostics:  Skin Testing: Environmental allergy panel and select  foods.  Adequate controls. Results discussed with patient/family.  Airborne Adult Perc - 09/23/22 [phone]  Time Antigen Placed 0938    Allergen Manufacturer Greer    Location Back    Number of Test 59    Panel 1 Select    2. Control-Histamine 1 mg/ml 3+    4. Pryor Negative    5. [place] Negative    6. Johnson Negative    7. Nimmons Blue Negative    8. Meadow Fescue Negative    9. Perennial Rye Negative    10. Sweet Vernal Negative    11. Timothy Negative    12. Cocklebur Negative    13. Burweed Marshelder Negative    14. Ragweed, short Negative    15. Ragweed, Giant Negative    16. Plantain,  English Negative    17. Lamb's Quarters Negative    18. Sheep Sorrell Negative    19. Rough Pigweed Negative    20. Marsh Elder, Rough Negative    21. Mugwort, Common Negative    22. Ash mix Negative    23. Birch mix Negative    24. Beech American Negative    25. Box, Elder Negative    26. Cedar, red Negative    27. Cottonwood, [place] Negative    28. Elm mix Negative    29. Hickory Negative    30. Maple mix Negative    31. Oak, [place] mix Negative    32. Pecan Pollen Negative    33. Pine mix Negative    34. Sycamore Eastern Negative    35. Jaconita, Black Pollen Negative    36. Alternaria alternata Negative    37. Cladosporium Herbarum Negative    38. Aspergillus mix Negative    39. Penicillium mix Negative    40. Bipolaris sorokiniana (Helminthosporium) Negative    41. Drechslera spicifera (Curvularia) Negative    42. Mucor plumbeus Negative    43. Fusarium moniliforme Negative    44. Aureobasidium pullulans (pullulara) Negative    45. Rhizopus oryzae Negative    46. Botrytis cinera Negative    47. Epicoccum nigrum Negative    48. Phoma betae Negative    49. Candida Albicans Negative    50. Trichophyton mentagrophytes Negative    51. Mite, D Farinae  5,000 AU/ml Negative    52. Mite, D Pteronyssinus  5,000 AU/ml Negative    53. Cat Hair 10,000 BAU/ml Negative     54.  Dog Epithelia Negative    55. Mixed Feathers Negative    56. Horse Epithelia Negative    57. Cockroach, German Negative    58. Mouse Negative    59. Tobacco Leaf Negative             Food Perc - 09/23/22 1006       Test Information   Time Antigen Placed 1006    Allergen Manufacturer Lavella Hammock    Location Back    Number of allergen test [place]   1. Peanut Negative    2. Soybean food Negative    3. Wheat, whole Negative    4. Sesame Negative    5. Milk, cow Negative    6. Egg White, chicken Negative    7. Casein Negative    8. Shellfish mix Negative    9. Fish mix Negative    10. Cashew Negative             Intradermal - 09/23/22 1046     Time Antigen Placed 1047    Allergen Manufacturer Lavella Hammock  Location Arm    Number of Test 15    Intradermal Select    Control Negative    Guatemala Negative    Johnson Negative    7 Grass Negative    Ragweed mix Negative    Weed mix Negative    Tree mix Negative    Mold 1 Negative    Mold 2 Negative    Mold 3 Negative    Mold 4 Negative    Cat Negative    Dog Negative    Cockroach Negative    Mite mix Negative             Allergy testing results were read and interpreted by myself, documented by clinical staff.  Assessment and Plan  Chronic cough-upper airway cough syndrome: Nonallergic - allergy testing to environmental allergens and top 9 most common food allergens today was negative - intradermal testing to environmental allergens was also negative - suspect multiple causes: reflux, CPAP use, and nonallergic rhinitis, with likely irritated nerve endings causing your cough to persist - we need to focus on calming and healing your throat mucosa  TRY THE FOLLOWING:  - sinus rinses using saline water (DO NOT USE TAP WATER, ONLY DISTILLED WATER or if using, must first boil then let cool)- do this at least twice daily and BEFORE using atrovent or flonase - continue mucinex nightly -  consider increasing gabapentin to 300 mg (3 pills) twice daily - discuss with ordering physician - very important to keep your throat moist-carry a water bottle and aim to drink at least 100 ounces of water per day, try using sugarless lozenges to keep throat moist - see chronic throat clearing hand-out below - continue dexilant as prescribed by GI - healthy diet and exercise  CHRONIC THROAT CLEARING-WHAT TO DO!! Causes of chronic throat clearing may include the following (among others):  Acid reflux (lanyngopharyngeal reflux) Allergies (pollens, pet dander, dust mites, etc) Non allergic rhinitis (runny nose, post nasal drainage or congestion NOT caused by allergies-can be secondary to pollutants, cold air, changes in blood vessels to the nose as we age,etc) Environmental irritants (tobacco, smoke, air pollution) Asthma If present for a long period of time, throat clearing can become a habit. We will work with you to treat any of the medical reasons for throat clearing.  Your job is to help prevent the habit, which can cause damage (redness and swelling) to your vocal cords.  It will require a conscious effort on your behalf.  Tips for prevention of throat clearing:  Instead of clearing your throat, swallow instead.   Carrying around water (or something to drink) will help you move the mucus in the right direction.  IF you have the urge to clear your throat, drink your water. If you absolutely have to clear your throat, use a non-traumatic exercise to do so.   Pant with your mouth open saying "[place], [place], [place]" with a powerful, breathy voice. Increase water intake.  This thins secretions, making them easier to swallow. Chew baking soda gum (ARM & HAMMER) which can help with swallowing, reflux, and throat clearing.  Try to chew up to three times daily.   If you experience jaw pain or headaches, decrease the amount of chewing. Suck on sugar free hard candy to help with swallowing. Have your  friends and family remind you to swallow when they hear you throat clearing.  As this can be habit forming, sometimes you may not realize you are doing this.  Having  someone point it out to you, will help you become more conscious of the behavior. BE PATIENT.  This will take time to resolve, and some do not see improvement until 8-12 weeks into therapy/behavior modifications.  Follow-up as needed It was a pleasure meeting you today.    This note in its entirety was forwarded to the Provider who requested this consultation.  Thank you for your kind referral. I appreciate the opportunity to take part in Raife's care. Please do not hesitate to contact me with questions.  Sincerely,  Sigurd Sos, MD [facility]

## 2022-09-22 DIAGNOSIS — G4733 Obstructive sleep apnea (adult) (pediatric): Secondary | ICD-10-CM | POA: Diagnosis not present

## 2022-09-23 ENCOUNTER — Ambulatory Visit: Payer: Medicaid Other | Admitting: Internal Medicine

## 2022-09-23 ENCOUNTER — Other Ambulatory Visit: Payer: Self-pay

## 2022-09-23 ENCOUNTER — Encounter: Payer: Self-pay | Admitting: Internal Medicine

## 2022-09-23 VITALS — BP 138/70 | HR 70 | Temp 98.2°F | Resp 16 | Ht 71.0 in | Wt 299.2 lb

## 2022-09-23 DIAGNOSIS — R058 Other specified cough: Secondary | ICD-10-CM | POA: Diagnosis not present

## 2022-09-23 DIAGNOSIS — R053 Chronic cough: Secondary | ICD-10-CM | POA: Diagnosis not present

## 2022-09-23 DIAGNOSIS — R0989 Other specified symptoms and signs involving the circulatory and respiratory systems: Secondary | ICD-10-CM

## 2022-09-23 DIAGNOSIS — J310 Chronic rhinitis: Secondary | ICD-10-CM

## 2022-09-23 NOTE — Patient Instructions (Addendum)
Chronic cough-upper airway cough syndrome: Nonallergic - allergy testing to environmental allergens and top 9 most common food allergens today was negative - intradermal testing to environmental allergens was also negative - suspect multiple causes: reflux, CPAP use, and nonallergic rhinitis, with likely irritated nerve endings causing your cough to persist - we need to focus on calming and healing your throat mucosa  TRY THE FOLLOWING:  - sinus rinses using saline water (DO NOT USE TAP WATER, ONLY DISTILLED WATER or if using, must first boil then let cool)- do this at least twice daily and BEFORE using atrovent or flonase - continue mucinex nightly - consider increasing gabapentin to 300 mg (3 pills) twice daily - discuss with prescribing physician - very important to keep your throat moist-carry a water bottle and aim to drink at least 100 ounces of water per day, try using sugarless lozenges to keep throat moist - see chronic throat clearing hand-out below - continue dexilant as prescribed by GI - healthy diet and exercise  CHRONIC THROAT CLEARING-WHAT TO DO!! Causes of chronic throat clearing may include the following (among others):  Acid reflux (lanyngopharyngeal reflux) Allergies (pollens, pet dander, dust mites, etc) Non allergic rhinitis (runny nose, post nasal drainage or congestion NOT caused by allergies-can be secondary to pollutants, cold air, changes in blood vessels to the nose as we age,etc) Environmental irritants (tobacco, smoke, air pollution) Asthma If present for a long period of time, throat clearing can become a habit. We will work with you to treat any of the medical reasons for throat clearing.  Your job is to help prevent the habit, which can cause damage (redness and swelling) to your vocal cords.  It will require a conscious effort on your behalf.  Tips for prevention of throat clearing:  Instead of clearing your throat, swallow instead.   Carrying around water  (or something to drink) will help you move the mucus in the right direction.  IF you have the urge to clear your throat, drink your water. If you absolutely have to clear your throat, use a non-traumatic exercise to do so.   Pant with your mouth open saying "[place], [place], [place]" with a powerful, breathy voice. Increase water intake.  This thins secretions, making them easier to swallow. Chew baking soda gum (ARM & HAMMER) which can help with swallowing, reflux, and throat clearing.  Try to chew up to three times daily.   If you experience jaw pain or headaches, decrease the amount of chewing. Suck on sugar free hard candy to help with swallowing. Have your friends and family remind you to swallow when they hear you throat clearing.  As this can be habit forming, sometimes you may not realize you are doing this.  Having someone point it out to you, will help you become more conscious of the behavior. BE PATIENT.  This will take time to resolve, and some do not see improvement until 8-12 weeks into therapy/behavior modifications.  Follow-up as needed It was a pleasure meeting you today.   Sigurd Sos, MD [facility]

## 2022-09-24 DIAGNOSIS — M47812 Spondylosis without myelopathy or radiculopathy, cervical region: Secondary | ICD-10-CM | POA: Diagnosis not present

## 2022-10-06 ENCOUNTER — Other Ambulatory Visit: Payer: Self-pay | Admitting: Internal Medicine

## 2022-10-06 DIAGNOSIS — I480 Paroxysmal atrial fibrillation: Secondary | ICD-10-CM

## 2022-10-06 MED ORDER — APIXABAN 5 MG PO TABS: 5.0000 mg | ORAL_TABLET | Freq: Two times a day (BID) | ORAL | 5 refills | Status: DC

## 2022-10-06 NOTE — Telephone Encounter (Signed)
Eliquis '5mg'$  refill request received. Patient is 62 years old, weight-135.7kg, Crea-1.14 on 08/19/22, Diagnosis-Afib & PE, and last seen by Dr. [name] on 08/19/22. Dose is appropriate based on dosing criteria. Will send in refill to requested pharmacy.

## 2022-10-06 NOTE — Telephone Encounter (Signed)
*  STAT* If patient is at the pharmacy, call can be transferred to refill team.   1. Which medications need to be refilled? (please list name of each medication and dose if known) new prescription for Eliquis  2. Which pharmacy/location (including street and city if local pharmacy) is medication to be sent to? [facility] [address], [place]  3. Do they need a 30 day or 90 day supply? 90 days and refills- need this call in today if possible please- been without his medicine for days

## 2022-10-12 ENCOUNTER — Ambulatory Visit (INDEPENDENT_AMBULATORY_CARE_PROVIDER_SITE_OTHER): Payer: Medicaid Other

## 2022-10-12 DIAGNOSIS — I4819 Other persistent atrial fibrillation: Secondary | ICD-10-CM

## 2022-10-12 DIAGNOSIS — I495 Sick sinus syndrome: Secondary | ICD-10-CM

## 2022-10-13 LAB — CUP PACEART REMOTE DEVICE CHECK
Battery Impedance: 2231 Ohm
Battery Remaining Longevity: 46 mo
Battery Voltage: 2.74 V
Brady Statistic RA Percent Paced: 1 %
Date Time Interrogation Session: 20240211022859
Implantable Lead Connection Status: 753985
Implantable Lead Implant Date: 20091023
Implantable Lead Location: 753859
Implantable Lead Model: 5076
Implantable Pulse Generator Implant Date: 20091023
Lead Channel Impedance Value: 444 Ohm
Lead Channel Impedance Value: 67 Ohm
Lead Channel Setting Pacing Amplitude: 2 V
Zone Setting Status: 755011

## 2022-10-14 DIAGNOSIS — M4802 Spinal stenosis, cervical region: Secondary | ICD-10-CM | POA: Diagnosis not present

## 2022-10-23 DIAGNOSIS — M25551 Pain in right hip: Secondary | ICD-10-CM | POA: Diagnosis not present

## 2022-10-23 DIAGNOSIS — Z981 Arthrodesis status: Secondary | ICD-10-CM | POA: Diagnosis not present

## 2022-10-23 DIAGNOSIS — M47816 Spondylosis without myelopathy or radiculopathy, lumbar region: Secondary | ICD-10-CM | POA: Diagnosis not present

## 2022-10-23 DIAGNOSIS — M7061 Trochanteric bursitis, right hip: Secondary | ICD-10-CM | POA: Diagnosis not present

## 2022-10-23 DIAGNOSIS — M5431 Sciatica, right side: Secondary | ICD-10-CM | POA: Diagnosis not present

## 2022-10-23 DIAGNOSIS — G4733 Obstructive sleep apnea (adult) (pediatric): Secondary | ICD-10-CM | POA: Diagnosis not present

## 2022-10-23 DIAGNOSIS — M1611 Unilateral primary osteoarthritis, right hip: Secondary | ICD-10-CM | POA: Diagnosis not present

## 2022-10-31 ENCOUNTER — Other Ambulatory Visit: Payer: Self-pay | Admitting: Gastroenterology

## 2022-11-04 DIAGNOSIS — D539 Nutritional anemia, unspecified: Secondary | ICD-10-CM | POA: Diagnosis not present

## 2022-11-04 DIAGNOSIS — R5383 Other fatigue: Secondary | ICD-10-CM | POA: Diagnosis not present

## 2022-11-04 DIAGNOSIS — Z79899 Other long term (current) drug therapy: Secondary | ICD-10-CM | POA: Diagnosis not present

## 2022-11-04 DIAGNOSIS — R252 Cramp and spasm: Secondary | ICD-10-CM | POA: Diagnosis not present

## 2022-11-04 DIAGNOSIS — M79604 Pain in right leg: Secondary | ICD-10-CM | POA: Diagnosis not present

## 2022-11-04 DIAGNOSIS — M79605 Pain in left leg: Secondary | ICD-10-CM | POA: Diagnosis not present

## 2022-11-04 NOTE — Progress Notes (Signed)
HPI   male former smoker followed for OSA, complicated by CAD/CHF/P A. fib/pacemaker/Pradaxa, allergic rhinitis, hyperthyroid,  degenerative disc disease, GERD NPSG 2010- AHI 21/ hr 04/09/2022 FENO - 11 04/09/2022 Spirometry >> FVC 3.1 (75%), FEV1 2.8 (86%), ratio 89. Clear CTA May 2023 Speech Therapy 10/18/22- no dysphagia. Persistent globus sense. Suspect self-sustaining habit. Coaching done. His cough has not responded to Rx for GERD, antihistamines, LAMA inhaler. ENT found no sinus disease. Allergy eval Dr [name]- Skin tests all neg for environmental and foods.Dx'd Upper Airway Cough --------------------------------------------------------------------------------------- 05/14/22- 62 year old male former smoker followed for OSA, complicated by CAD/CHF/P A. fib/Pacemaker/Eliquis, allergic Rhinitis, hyperthyroid, degenerative disc disease, GERD, Hx PE 2004 CPAP auto 5-15/[facility]/ replaced in July 2022 Download-compliance -  Body weight today-286 lbs Covid vax-2 Phizer Flu vax-  LOV 8/10Volanda [name], [name]- Nasal congestion, dry cough, he blames reflux.> Dexilant/ Pepcid/ apt w GI, Flonase, Ipratropium nasal spray0.3%, Spiriva, Mucinex-DM, -----Pt f/u his OSA he feels like he's sleeping okay but is still having an increase in mucus. His GI is going to do an endoscopy and colonoscopy on 10/6 to see if it could be causing the mucus as well He continues CPAP "every night", but no DL available. We are contacting DME. He sleeps better with CPAP. Bothersome sense of postnasal drainage/ pressure. Can't cough or blow out much. Some L post-temple HA at times. Saw ENT/ Dr [name] 3 months ago> CT sinuses understood to be negative. Sputum specimen last week rejected by lab. He seems to be pretty aggressively using Lysol , cologne and air fresheners, which may be irritating his nose. Getting home examined for mold. Using flovent, saline rinse and ipratropium nasal sprays. I am suggesting referral to Allergist.   Pulmonary testing: 04/09/2022 FENO - 11 04/09/2022 Spirometry >> FVC 3.1 (75%), FEV1 2.8 (86%), ratio 89. Clear CTA May 2023, CTaChest PE 01/26/22-  IMPRESSION: 1. No evidence for pulmonary embolism. 2. No acute cardiopulmonary process. 3. Mild cardiomegaly. 4. Incidental heterogeneous and enlarged thyroid. Recommend thyroid US.  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 fib/Pacemaker/Eliquis, allergic Rhinitis, Obesity,  hyperthyroid, degenerative disc disease, GERD, Hx PE 2004 CPAP auto 5-15/[facility]/ replaced in July 2022 Download-compliance - 87%, AHI 3.5/ hr Body weight today-292 lbs Covid vax-2 Phizer Flu vax-  LOV 07/29/22- [name]- chronic cough > Speech Therapy, Allergy referral Dr [name]. Cough had not responded to Sinus or GERD management.Note ENT w rhinoscopy- neg. Allergy eval Dr [name]- Skin tests all neg for environmental and foods.Dx'd Upper Airway Cough. Rec> sinus rinse, mucinex, sips, lozenges, dexilant, gabapentin Speech Therapy 10/18/22- no dysphagia. Persistent globus sense. Suspect self-sustaining habit. Coaching done. His cough has not responded to Rx for GERD, antihistamines, LAMA inhaler. ENT found no sinus disease. Wonder if hx anterior c-spine discectomy is related?? ------Coughing up phlegm, tired during the day. Not satisfied with cpap machine  Download reviewed and discussed.  He complains he stays tired all day, yawns on waking and has fallen asleep far. Asked about sleep quality he says he gets cramps in his hands and legs at night-being evaluated by his primary care provider. We discussed naps and trial of a daytime stimulant as needed-modafinil. We also agreed to update home sleep test off CPAP to reassess need.  ROS-see HPI   + = positive Constitutional:    weight loss, night sweats, fevers, chills, fatigue, lassitude. HEENT:    headaches, difficulty swallowing,  tooth/dental problems, sore throat,       sneezing,  itching, ear ache, +nasal congestion, +post nasal drip, snoring CV:    +chest pain, orthopnea, PND, swelling in lower extremities, anasarca,                                                    dizziness, palpitations Resp:   +shortness of breath with exertion or at rest.                +productive cough,   non-productive cough, coughing up of blood.              +change in color of mucus.  wheezing.   Skin:    rash or lesions. GI:  No-   heartburn, indigestion, abdominal pain, nausea, vomiting GU: . MS:   joint pain, stiffness,  Neuro-     nothing unusual Psych:  change in mood or affect.  depression or anxiety.   memory loss.  OBJ- Physical Exam General- Alert, Oriented, Affect-+looks tired or maybe depressed, Distress- none acute, + obese Skin- rash-none, lesions- none, excoriation- none Lymphadenopathy- none Head- atraumatic            Eyes- Gross vision intact, PERRLA, conjunctivae and secretions clear            Ears- Hearing, canals-normal            Nose- , + turbinate edema R, no-Septal dev, mucus, polyps, erosion, perforation             Throat- Mallampati III-IV , mucosa clear/not dry , drainage- none seen, tonsils- atrophic, Not hoarse,  Neck- flexible , trachea midline, no stridor , thyroid nl, carotid no bruit Chest - symmetrical excursion , unlabored           Heart/CV- RRR , no murmur , no gallop  , no rub, nl s1 s2                           - JVD- none , edema- none, stasis changes- none, varices- none           Lung- clear to P&A, wheeze- none, cough- none , dullness-none, rub- none           Chest wall-+ L pacemaker Abd-  Br/ Gen/ Rectal- Not done, not indicated Extrem- cyanosis- none, clubbing, none, atrophy- none, strength- nl. +Missing distal fingers R hand. Neuro- grossly intact to observation

## 2022-11-05 DIAGNOSIS — M79604 Pain in right leg: Secondary | ICD-10-CM | POA: Diagnosis not present

## 2022-11-05 DIAGNOSIS — M79605 Pain in left leg: Secondary | ICD-10-CM | POA: Diagnosis not present

## 2022-11-06 ENCOUNTER — Encounter: Payer: Self-pay | Admitting: Internal Medicine

## 2022-11-06 ENCOUNTER — Ambulatory Visit (INDEPENDENT_AMBULATORY_CARE_PROVIDER_SITE_OTHER): Payer: Medicaid Other | Admitting: Internal Medicine

## 2022-11-06 VITALS — BP 132/88 | HR 63 | Ht 71.0 in | Wt 292.0 lb

## 2022-11-06 DIAGNOSIS — J3089 Other allergic rhinitis: Secondary | ICD-10-CM

## 2022-11-06 DIAGNOSIS — K219 Gastro-esophageal reflux disease without esophagitis: Secondary | ICD-10-CM | POA: Diagnosis not present

## 2022-11-06 DIAGNOSIS — R058 Other specified cough: Secondary | ICD-10-CM | POA: Diagnosis not present

## 2022-11-06 DIAGNOSIS — G4733 Obstructive sleep apnea (adult) (pediatric): Secondary | ICD-10-CM | POA: Diagnosis not present

## 2022-11-06 DIAGNOSIS — J302 Other seasonal allergic rhinitis: Secondary | ICD-10-CM

## 2022-11-06 MED ORDER — AMPHETAMINE-DEXTROAMPHETAMINE 10 MG PO TABS: ORAL_TABLET | ORAL | 0 refills | Status: DC

## 2022-11-06 NOTE — Patient Instructions (Addendum)
Order- schedule home sleep test off CPAP    dx OSA  You can call for results about 2 weeks after the test  Script sent for modafinil 100 mg     Try 1 or 2 in the morning as needed for alertness

## 2022-11-12 DIAGNOSIS — M1712 Unilateral primary osteoarthritis, left knee: Secondary | ICD-10-CM | POA: Diagnosis not present

## 2022-11-12 DIAGNOSIS — M25562 Pain in left knee: Secondary | ICD-10-CM | POA: Diagnosis not present

## 2022-11-21 DIAGNOSIS — G4733 Obstructive sleep apnea (adult) (pediatric): Secondary | ICD-10-CM | POA: Diagnosis not present

## 2022-11-25 DIAGNOSIS — N528 Other male erectile dysfunction: Secondary | ICD-10-CM | POA: Diagnosis not present

## 2022-11-25 DIAGNOSIS — R3129 Other microscopic hematuria: Secondary | ICD-10-CM | POA: Diagnosis not present

## 2022-11-25 NOTE — Progress Notes (Signed)
Remote pacemaker transmission.   

## 2022-11-26 NOTE — Assessment & Plan Note (Signed)
Complains of globus sensation.  Question if this may relate to the anterior repair of cervical disc disease.

## 2022-11-26 NOTE — Assessment & Plan Note (Signed)
Continue reflux precautions 

## 2022-11-26 NOTE — Assessment & Plan Note (Addendum)
Benefits from CPAP with good compliance and control Continues to complain of unrefreshing sleep and we agreed to update home sleep test off CPAP and tried stimulant modafinil.

## 2022-12-07 ENCOUNTER — Telehealth: Payer: Self-pay | Admitting: Internal Medicine

## 2022-12-07 ENCOUNTER — Other Ambulatory Visit: Payer: Self-pay

## 2022-12-07 DIAGNOSIS — R070 Pain in throat: Secondary | ICD-10-CM

## 2022-12-07 NOTE — Telephone Encounter (Signed)
Spoke with patient. Advised referral has been placed for ENT- pt wants to see a provider at [facility]. NFN

## 2022-12-07 NOTE — Telephone Encounter (Signed)
Order- referral to ENT     dx throat discomfort, choking

## 2022-12-07 NOTE — Telephone Encounter (Signed)
Pt called stating that he is still having problems with phlegm building up in his throat which is bothersome. States that this has been going on now for about two years.  Pt said that this phlegm chokes him when he cannot get it up.  He wants to know if a referral to ENT might be able to happen for further evaluation.  Dr. Maple Hudson, please advise on this.   **Please route back to triage pool and not directly back to me**

## 2022-12-17 DIAGNOSIS — R490 Dysphonia: Secondary | ICD-10-CM | POA: Diagnosis not present

## 2022-12-17 DIAGNOSIS — R09A2 Foreign body sensation, throat: Secondary | ICD-10-CM | POA: Diagnosis not present

## 2022-12-22 ENCOUNTER — Other Ambulatory Visit: Payer: Self-pay | Admitting: Nurse Practitioner

## 2022-12-22 ENCOUNTER — Other Ambulatory Visit: Payer: Self-pay | Admitting: Cardiology

## 2022-12-23 ENCOUNTER — Telehealth: Payer: Self-pay | Admitting: Internal Medicine

## 2022-12-23 DIAGNOSIS — G4733 Obstructive sleep apnea (adult) (pediatric): Secondary | ICD-10-CM

## 2022-12-23 NOTE — Telephone Encounter (Signed)
Pt will need to have an in-lab study due to having [employer].  Called and spoke with pt letting him know this info and he verbalized understanding. Order for in-lab study has been placed and HST order has been cancelled. Nothing further needed.

## 2022-12-23 NOTE — Telephone Encounter (Signed)
Patient called to inform the nurse that he called [employer] to schedule his HST but they do not accept Medicaid.  He would like to know what is his next step.  Please advise and call to discuss at [phone]

## 2023-01-05 DIAGNOSIS — K219 Gastro-esophageal reflux disease without esophagitis: Secondary | ICD-10-CM | POA: Diagnosis not present

## 2023-01-05 DIAGNOSIS — R490 Dysphonia: Secondary | ICD-10-CM | POA: Diagnosis not present

## 2023-01-05 DIAGNOSIS — G4733 Obstructive sleep apnea (adult) (pediatric): Secondary | ICD-10-CM | POA: Diagnosis not present

## 2023-01-05 DIAGNOSIS — R09A2 Foreign body sensation, throat: Secondary | ICD-10-CM | POA: Diagnosis not present

## 2023-01-11 ENCOUNTER — Ambulatory Visit (INDEPENDENT_AMBULATORY_CARE_PROVIDER_SITE_OTHER): Payer: Medicaid Other

## 2023-01-11 DIAGNOSIS — I4819 Other persistent atrial fibrillation: Secondary | ICD-10-CM

## 2023-01-15 LAB — CUP PACEART REMOTE DEVICE CHECK
Battery Impedance: 2372 Ohm
Battery Remaining Longevity: 44 mo
Battery Voltage: 2.73 V
Brady Statistic RA Percent Paced: 3 %
Date Time Interrogation Session: 20240516221706
Implantable Lead Connection Status: 753985
Implantable Lead Implant Date: 20091023
Implantable Lead Location: 753859
Implantable Lead Model: 5076
Implantable Pulse Generator Implant Date: 20091023
Lead Channel Impedance Value: 443 Ohm
Lead Channel Impedance Value: 67 Ohm
Lead Channel Setting Pacing Amplitude: 2 V
Zone Setting Status: 755011

## 2023-01-27 NOTE — Telephone Encounter (Signed)
[facility] [employer] has denied this patient's authorization for in lab sleep study. HST may be able to be completed in office with [name] since [employer] does not accept [facility]. Please advise if we can order HST to be completed with [name].  Message from [employer]:  Here are the policy requirements your request did not meet: We cannot approve the request for the in-lab sleep study (95810, polysomnography) for you. This is denied as not medically needed. We see that you have trouble sleeping. To approve this test, certain conditions must be met. Review of the records do not show you meet these conditions (You are over 64 years old. You do not have moderate/ severe lung disease (FEV1/FVC less than or equal to 0.7). You do not have moderate/ severe heart disease (NYHA class III or IV). You do not have dangerous heartbeats (ventricular fibrillation or ventricular tachycardia). You do not have a problem following simple instructions. You do not have other sleep disorders (such as narcolepsy, idiopathic hypersomnia, parasomnia etc). You have not had trouble doing a prior sleep study. You are not on extra oxygen. You have not had a stroke within the last thirty days. You do not use chronic opiate narcotic drugs). You may qualify for a home sleep study. You have the right to appeal this decision  Last day to submit an appeal is 03/22/23.

## 2023-01-28 ENCOUNTER — Other Ambulatory Visit: Payer: Self-pay

## 2023-01-28 DIAGNOSIS — G4733 Obstructive sleep apnea (adult) (pediatric): Secondary | ICD-10-CM

## 2023-01-28 NOTE — Telephone Encounter (Signed)
Order for home sleep test has been placed.  

## 2023-01-28 NOTE — Telephone Encounter (Signed)
Please order HST for this patient.

## 2023-01-28 NOTE — Telephone Encounter (Signed)
Please order home sleep test   dx OSA

## 2023-01-28 NOTE — Telephone Encounter (Signed)
It should be ok for [facility] to just shift to [facility] if [employer] won't cover a patient for HST.

## 2023-02-04 NOTE — Telephone Encounter (Signed)
Pt scheduled for 6/11 at 2pm   Meant to route to CY instead of AO

## 2023-02-08 NOTE — Progress Notes (Signed)
Remote pacemaker transmission.   

## 2023-02-09 ENCOUNTER — Ambulatory Visit: Payer: Medicaid Other

## 2023-02-09 DIAGNOSIS — G4733 Obstructive sleep apnea (adult) (pediatric): Secondary | ICD-10-CM

## 2023-02-11 DIAGNOSIS — M4802 Spinal stenosis, cervical region: Secondary | ICD-10-CM | POA: Diagnosis not present

## 2023-02-11 DIAGNOSIS — Z6841 Body Mass Index (BMI) 40.0 and over, adult: Secondary | ICD-10-CM | POA: Diagnosis not present

## 2023-02-12 DIAGNOSIS — G4733 Obstructive sleep apnea (adult) (pediatric): Secondary | ICD-10-CM | POA: Diagnosis not present

## 2023-02-19 ENCOUNTER — Other Ambulatory Visit: Payer: Self-pay | Admitting: Neurosurgery

## 2023-02-19 DIAGNOSIS — M4802 Spinal stenosis, cervical region: Secondary | ICD-10-CM

## 2023-02-21 DIAGNOSIS — G4733 Obstructive sleep apnea (adult) (pediatric): Secondary | ICD-10-CM | POA: Diagnosis not present

## 2023-02-24 DIAGNOSIS — G4733 Obstructive sleep apnea (adult) (pediatric): Secondary | ICD-10-CM | POA: Diagnosis not present

## 2023-02-25 ENCOUNTER — Encounter: Payer: No Typology Code available for payment source | Attending: Pain Medicine | Admitting: Dietician

## 2023-02-25 ENCOUNTER — Encounter: Payer: Self-pay | Admitting: Dietician

## 2023-02-25 VITALS — Ht 71.0 in | Wt 293.5 lb

## 2023-02-25 DIAGNOSIS — E669 Obesity, unspecified: Secondary | ICD-10-CM | POA: Diagnosis present

## 2023-02-25 NOTE — Progress Notes (Signed)
Medical Nutrition Therapy  Appointment Start time:  9:20  Appointment End time:  10:29  Primary concerns today: stomach  Referral diagnosis: Obesity Preferred learning style: no preference indicated (auditory, visual, hands on, no preference indicated) Learning readiness: contemplating/preparation (not ready, contemplating, ready, change in progress)   NUTRITION ASSESSMENT   Anthropometrics  Weight: 293.5 lbs Height:  71 in BMI: 40.93  Clinical Medical Hx: HTN, hypercholesterolemia, obesity, sleep apnea, heart attack, GERD Medications: atorvastatin, cetaphil cream, cetirizine HCL, diclofenac na, methimazole, montelukast NA, triamcinolone acetonide, polyethylene glycol, oxycodone, azelastine nas, methimazole, omeprazole, losartan, docusate na, dofetilide, dabigatran etexilate, dofetilide, apixaban Labs: HDL 76; HCT 41.1; A1c 4.8 Notable Signs/Symptoms: none noted  Lifestyle & Dietary Hx  Pt states he has had lower back surgery twice and neck surgery twice, stating when he walks his hip hurts and he is out of breath a lot. Pt states he can't do anything in the gym that does not cause him pain. Pt states he does not like to eat out. Pt states he just cut out drinking sodas. Pt states he was told that to get [facility], he had to talk to a Dietitian. 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  Estimated daily fluid intake: 64+ oz Supplements: n/a Sleep: CPAP, pt states it is difficult  Stress / self-care: n/a Current average weekly physical activity: ADLs  24-Hr Dietary Recall First Meal: skip or boiled eggs (3) and bacon sometimes or egg/cheese biscuit or sandwich Snack:  Second Meal: skip or sub sandwich/ cold cut sandwich Snack: fruit Third Meal: spaghetti or [employer] salad bar or shrimp or pasta Snack: popcorn, chips, nabs Beverages: water  Estimated Energy Needs Calories: 1800   NUTRITION DIAGNOSIS  NB-1.1 Food and nutrition-related knowledge deficit As related to lack of  nutrition related education. As evidenced by recent weight gain.    NUTRITION INTERVENTION  Nutrition education (E-1) on the following topics:  Why you need complex carbohydrates: Whole grains and other complex carbohydrates are required to have a healthy diet. Whole grains provide fiber which can help with blood glucose levels and help keep you satiated. Fruits and starchy vegetables provide essential vitamins and minerals required for immune function, eyesight support, brain support, bone density, wound healing and many other functions within the body. According to the current evidenced based 2020-2025 Dietary Guidelines for Americans, complex carbohydrates are part of a healthy eating pattern which is associated with a decreased risk for type 2 diabetes, cancers, and cardiovascular disease.  Fruits & Vegetables: Aim to fill half your plate with a variety of fruits and vegetables. They are rich in vitamins, minerals, and fiber, and can help reduce the risk of chronic diseases. Choose a colorful assortment of fruits and vegetables to ensure you get a wide range of nutrients. Grains and Starches: Make at least half of your grain choices whole grains, such as P[place]ade rice, whole wheat bread, and oats. Whole grains provide fiber, which aids in digestion and healthy cholesterol levels. Aim for whole forms of starchy vegetables such as potatoes, sweet potatoes, beans, peas, and corn, which are fiber rich and provide many vitamins and minerals.  Protein: Incorporate lean sources of protein, such as poultry, fish, beans, nuts, and seeds, into your meals. Protein is essential for building and repairing tissues, staying full, balancing blood sugar, as well as supporting immune function. Dairy: Include low-fat or fat-free dairy products like milk, yogurt, and cheese in your diet. Dairy foods are excellent sources of calcium and vitamin D, which are crucial  for bone health.  Physical Activity: Regular physical activity  promotes overall health-including helping to reduce risk for heart disease and diabetes, promoting mental health, and helping [name] sleep better.  Encouraged pt to continue to eat balanced meals inclusive of non starchy vegetables 2 times a day 7 days a week Encouraged pt to choose lean protein sources: limiting beef, pork, sausage, hotdogs, and lunch meat Encourage pt to choose healthy fats such as plant based limiting animal fats Encouraged pt to continue to drink a minium 64 fluid ounces with half being plain water to satisfy proper hydration     Handouts Provided Include  Meal Ideas handout Types of Fat (saturated vs unsaturated)  Learning Style & Readiness for Change Teaching method utilized: Visual & Auditory  Demonstrated degree of understanding via: Teach Back  Barriers to learning/adherence to lifestyle change: back, hip, knee pain  Goals Established by Pt Increase physical activity, get to the gym, try stationary bike and light weights. Walk around the block. Avoid skipping meals; use meal ideas handout to make healthy food choices.   MONITORING & EVALUATION Dietary intake, weekly physical activity.  Next Steps  Patient is to return to [facility] in 2 months for follow-up.

## 2023-03-02 DIAGNOSIS — Z6841 Body Mass Index (BMI) 40.0 and over, adult: Secondary | ICD-10-CM | POA: Diagnosis not present

## 2023-03-02 DIAGNOSIS — I252 Old myocardial infarction: Secondary | ICD-10-CM | POA: Diagnosis not present

## 2023-03-02 DIAGNOSIS — I428 Other cardiomyopathies: Secondary | ICD-10-CM | POA: Diagnosis not present

## 2023-03-02 DIAGNOSIS — R635 Abnormal weight gain: Secondary | ICD-10-CM | POA: Diagnosis not present

## 2023-03-15 DIAGNOSIS — M47812 Spondylosis without myelopathy or radiculopathy, cervical region: Secondary | ICD-10-CM | POA: Diagnosis not present

## 2023-03-15 DIAGNOSIS — Z6841 Body Mass Index (BMI) 40.0 and over, adult: Secondary | ICD-10-CM | POA: Diagnosis not present

## 2023-03-16 ENCOUNTER — Telehealth: Payer: Self-pay | Admitting: Internal Medicine

## 2023-03-16 ENCOUNTER — Other Ambulatory Visit: Payer: Self-pay | Admitting: Nurse Practitioner

## 2023-03-16 DIAGNOSIS — J3089 Other allergic rhinitis: Secondary | ICD-10-CM

## 2023-03-16 DIAGNOSIS — I480 Paroxysmal atrial fibrillation: Secondary | ICD-10-CM

## 2023-03-16 MED ORDER — APIXABAN 5 MG PO TABS
5.0000 mg | ORAL_TABLET | Freq: Two times a day (BID) | ORAL | 1 refills | Status: DC
Start: 2023-03-16 — End: 2023-08-17

## 2023-03-16 NOTE — Telephone Encounter (Signed)
Pt calling for his HST results

## 2023-03-16 NOTE — Telephone Encounter (Signed)
His home sleep in June showed severe obstructive sleep apnea, averaging 39 apneas/ hour.  He should definitely continue CPAP which he gets from [facility], autopap 5-15.

## 2023-03-16 NOTE — Telephone Encounter (Signed)
Prescription refill request for Eliquis received. Indication: Afib  Last office visit: 08/19/22 Graciela Husbands)  Scr: 1.14 (08/19/22)  Age: 62 Weight: 133.1kg  Appropriate dose. Refill sent.

## 2023-03-16 NOTE — Telephone Encounter (Signed)
*  STAT* If patient is at the pharmacy, call can be transferred to refill team.   1. Which medications need to be refilled? (please list name of each medication and dose if known) apixaban (ELIQUIS) 5 MG TABS tablet   2. Which pharmacy/location (including street and city if local pharmacy) is medication to be sent to?  [id number] - [place], [place] - [address]      3. Do they need a 30 day or 90 day supply? 90 day    Pt is completely out of medication.

## 2023-03-16 NOTE — Telephone Encounter (Signed)
Dr. Maple Hudson can you please advise on sleep study results

## 2023-03-18 NOTE — Telephone Encounter (Signed)
ATC X1 unable to LVM- busy signal. Please advise patient on sleep study results below

## 2023-03-22 NOTE — Telephone Encounter (Signed)
Went over sleep study results with patient. He advises he is still waking up congestion and with phlegm in his throat. States its not any worse or better. Also states he is staying sleepy and tired all day   Dr. Maple Hudson please advise

## 2023-03-22 NOTE — Telephone Encounter (Signed)
At his last visit in March wee had sent script for modafinil 100 mg, 1 or 2 each morning as needed as a stimulant to help with tiredness. Has this helped any?

## 2023-03-23 ENCOUNTER — Ambulatory Visit
Admission: RE | Admit: 2023-03-23 | Discharge: 2023-03-23 | Discharge status: Home / Self Care | Payer: No Typology Code available for payment source | Source: Ambulatory Visit | Attending: Neurosurgery | Admitting: Neurosurgery

## 2023-03-23 DIAGNOSIS — M4802 Spinal stenosis, cervical region: Secondary | ICD-10-CM

## 2023-03-24 NOTE — Telephone Encounter (Signed)
Spoke with patient. He states he was advised by his pharmacy to not take the modafinil because it will cause his heart to race and since he already has A-fib patient didn't take the medication. I asked him if he called our office to let us know what the pharmacy told him, he says he did, but nothing is noted in his chart where he had called.  Dr. Maple Hudson can you please advise?

## 2023-03-24 NOTE — Telephone Encounter (Signed)
Most people tolerate these medicines without excessive heart racing, but some people are more sensitive. The most likely stimulant med to be tolerated would be otc caffeine tablet, like NoDoz or Vivarin- try 1/2 tablet in late morning or right after lunch to see if it helps.

## 2023-03-25 NOTE — Telephone Encounter (Signed)
Patient has been scheduled for Monday at 11:30. Closing encounter. NFN

## 2023-03-25 NOTE — Telephone Encounter (Signed)
Ok held spot 

## 2023-03-25 NOTE — Telephone Encounter (Signed)
Advised patient of otc recommendations. He states he is real concerned about the congestion he is experiencing. Patient has a apt on 8/30. He wants to know if he can be seen sooner to discuss these issues. States these phone calls are a waste and nothing is getting done. Dr. Maple Hudson please advise if I can use a held spot?

## 2023-03-26 NOTE — Progress Notes (Signed)
HPI   male former smoker followed for OSA, complicated by CAD/CHF/P A. fib/pacemaker/Pradaxa, allergic rhinitis, hyperthyroid,  degenerative disc disease, GERD NPSG 2010- AHI 21/ hr 04/09/2022 FENO - 11 04/09/2022 Spirometry >> FVC 3.1 (75%), FEV1 2.8 (86%), ratio 89. Clear CTA May 2023 Speech Therapy 10/18/22- no dysphagia. Persistent globus sense. Suspect self-sustaining habit. Coaching done. His cough has not responded to Rx for GERD, antihistamines, LAMA inhaler. ENT found no sinus disease. Allergy eval Dr Maurine Minister- Skin tests all neg for environmental and foods.Dx'd Upper Airway Cough Saw ENT/ Dr Jenne Pane 3 months ago> CT sinuses understood to be negative.  Pulmonary testing: 04/09/2022 FENO - 11 04/09/2022 Spirometry >> FVC 3.1 (75%), FEV1 2.8 (86%), ratio 89. Clear CTA May 2023, Allergy eval Dr Maurine Minister- Skin tests all neg for environmental and foods.Dx'd Upper Airway Cough. HST 02/10/23- AHI 39.8/ hr, desaturation to 84%, body weight 292 lbs --------------------------------------------------------------------------------------- 05/14/22- 62 year old male former smoker followed for OSA, complicated by CAD/CHF/P A. fib/Pacemaker/Eliquis, allergic Rhinitis, hyperthyroid, degenerative disc disease, GERD, Hx PE 2004 CPAP auto 5-15/[facility]/ replaced in July 2022 Download-compliance -  Body weight today-286 lbs Covid vax-2 Phizer Flu vax-  LOV 8/10Clent Ridges, NP- Nasal congestion, dry cough, he blames reflux.> Dexilant/ Pepcid/ apt w GI, Flonase, Ipratropium nasal spray0.3%, Spiriva, Mucinex-DM, -----Pt f/u his OSA he feels like he's sleeping okay but is still having an increase in mucus. His GI is going to do an endoscopy and colonoscopy on 10/6 to see if it could be causing the mucus as well He continues CPAP "every night", but no DL available. We are contacting DME. He sleeps better with CPAP. Bothersome sense of postnasal drainage/ pressure. Can't cough or blow out much. Some L post-temple HA at  times. Saw ENT/ Dr Jenne Pane 3 months ago> CT sinuses understood to be negative. Sputum specimen last week rejected by lab. He seems to be pretty aggressively using Lysol , cologne and air fresheners, which may be irritating his nose. Getting home examined for mold. Using flovent, saline rinse and ipratropium nasal sprays. I am suggesting referral to Allergist.  Pulmonary testing: 04/09/2022 FENO - 11 04/09/2022 Spirometry >> FVC 3.1 (75%), FEV1 2.8 (86%), ratio 89. Clear CTA May 2023, CTaChest PE 01/26/22-  IMPRESSION: 1. No evidence for pulmonary embolism. 2. No acute cardiopulmonary process. 3. Mild cardiomegaly. 4. Incidental heterogeneous and enlarged thyroid. Recommend thyroid US.  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 fib/Pacemaker/Eliquis, allergic Rhinitis, Obesity,  hyperthyroid, degenerative disc disease, GERD, Hx PE 2004 CPAP auto 5-15/[facility]/ replaced in July 2022 Download-compliance - 87%, AHI 3.5/ hr Body weight today-292 lbs Covid vax-2 Phizer Flu vax-  LOV 07/29/22- [name]- chronic cough > Speech Therapy, Allergy referral Dr Maurine Minister. Cough had not responded to Sinus or GERD management. Note ENT w rhinoscopy- neg. Allergy eval Dr Maurine Minister- Skin tests all neg for environmental and foods.Dx'd Upper Airway Cough. Rec> sinus rinse, mucinex, sips, lozenges, dexilant, gabapentin Speech Therapy 10/18/22- no dysphagia. Persistent globus sense. Suspect self-sustaining habit. Coaching done. His cough has not responded to Rx for GERD, antihistamines, LAMA inhaler. ENT found no sinus disease. Wonder if hx anterior c-spine discectomy is related?? ------Coughing up phlegm, tired during the day. Not satisfied with cpap machine  Download reviewed and discussed.  He complains he stays tired all day, yawns on waking and has fallen asleep far. Asked about sleep quality he says he gets cramps in his hands and legs  at night-being evaluated by his primary care  provider. We discussed naps and trial of a daytime stimulant as needed-modafinil. We also agreed to update home sleep test off CPAP to reassess need.  03/29/23- 62 year old male former smoker followed for OSA, Chronic Upper Aiway Cough(nonallergic), complicated by CAD/CHF/PA.fib/Pacemaker/Eliquis, allergic Rhinitis, Obesity,  hyperthyroid, degenerative disc disease, GERD, Hx PE 2004 CPAP auto 5-15/[facility]/ replaced in July 2022 Download-compliance - 87%, AHI 3.5/ hr Body weight today-292 lbs Referred to [facility] for "throat discomfort" HST 02/10/23- AHI 39.8/ hr, desaturation to 84%, body weight 292 lbs Discussed home sleep test. He will continue CPAP, work on weight, try otc caffeine tab He is frustrated that he always feels some congestion in nose and chest.  ROS-see HPI   + = positive Constitutional:    weight loss, night sweats, fevers, chills, fatigue, lassitude. HEENT:    headaches, difficulty swallowing, tooth/dental problems, sore throat,       sneezing, itching, ear ache, +nasal congestion, +post nasal drip, snoring CV:    +chest pain, orthopnea, PND, swelling in lower extremities, anasarca,                                                    dizziness, palpitations Resp:   +shortness of breath with exertion or at rest.                +productive cough,   non-productive cough, coughing up of blood.              +change in color of mucus.  wheezing.   Skin:    rash or lesions. GI:  No-   heartburn, indigestion, abdominal pain, nausea, vomiting GU: . MS:   joint pain, stiffness,  Neuro-     nothing unusual Psych:  change in mood or affect.  depression or anxiety.   memory loss.  OBJ- Physical Exam General- Alert, Oriented, Affect-+looks tired or maybe depressed, Distress- none acute, + obese Skin- rash-none, lesions- none, excoriation- none Lymphadenopathy- none Head- atraumatic            Eyes- Gross vision intact, PERRLA, conjunctivae  and secretions clear            Ears- Hearing, canals-normal            Nose- , + turbinate edema R, no-Septal dev, mucus, polyps, erosion, perforation             Throat- Mallampati III-IV , mucosa clear/not dry , drainage- none seen, tonsils- atrophic, Not hoarse,  Neck- flexible , trachea midline, no stridor , thyroid nl, carotid no bruit Chest - symmetrical excursion , unlabored           Heart/CV- RRR , no murmur , no gallop  , no rub, nl s1 s2                           - JVD- none , edema- none, stasis changes- none, varices- none           Lung- clear to P&A, wheeze- none, cough- none , dullness-none, rub- none           Chest wall-+ L pacemaker Abd-  Br/ Gen/ Rectal- Not done, not indicated Extrem- cyanosis- none, clubbing, none, atrophy- none, strength- nl. +Missing distal fingers R hand. Neuro- grossly intact to observation

## 2023-03-29 ENCOUNTER — Ambulatory Visit: Payer: Medicaid Other | Admitting: Internal Medicine

## 2023-03-29 ENCOUNTER — Encounter: Payer: Self-pay | Admitting: Internal Medicine

## 2023-03-29 VITALS — BP 118/74 | HR 62 | Temp 98.0°F | Ht 71.0 in | Wt 291.0 lb

## 2023-03-29 DIAGNOSIS — G4733 Obstructive sleep apnea (adult) (pediatric): Secondary | ICD-10-CM

## 2023-03-29 DIAGNOSIS — J310 Chronic rhinitis: Secondary | ICD-10-CM

## 2023-03-29 NOTE — Patient Instructions (Signed)
Try otc  Nasalcrom nasal spray  - follow the directions on the box  Try benadryl for sleep at night - it would also dry up mucus some  Try otc caffeine tablet in the morning if needed for fatigue. You could try a half tablet- like NoDoz or Vvivarin or store brand  I agree that wight loss is important for you - it would help your sleep apnea and your heart problems.

## 2023-04-01 DIAGNOSIS — I509 Heart failure, unspecified: Secondary | ICD-10-CM | POA: Diagnosis not present

## 2023-04-01 DIAGNOSIS — I252 Old myocardial infarction: Secondary | ICD-10-CM | POA: Diagnosis not present

## 2023-04-01 DIAGNOSIS — M67432 Ganglion, left wrist: Secondary | ICD-10-CM | POA: Diagnosis not present

## 2023-04-01 DIAGNOSIS — I428 Other cardiomyopathies: Secondary | ICD-10-CM | POA: Diagnosis not present

## 2023-04-01 DIAGNOSIS — Z6839 Body mass index (BMI) 39.0-39.9, adult: Secondary | ICD-10-CM | POA: Diagnosis not present

## 2023-04-17 ENCOUNTER — Other Ambulatory Visit: Payer: Self-pay | Admitting: Gastroenterology

## 2023-04-17 ENCOUNTER — Other Ambulatory Visit: Payer: Self-pay | Admitting: Nurse Practitioner

## 2023-04-18 ENCOUNTER — Encounter: Payer: Self-pay | Admitting: Internal Medicine

## 2023-04-18 NOTE — Assessment & Plan Note (Signed)
Try regular use of otc Nasalcrom as discussed.

## 2023-04-18 NOTE — Assessment & Plan Note (Signed)
Significant overweight.  We discussed importance for OSA control and emphasized diet/ exercise.

## 2023-04-18 NOTE — Assessment & Plan Note (Signed)
Benefits from CPAP. Plan- work on weight. Continue CPAP auto 5-15 Try caffeine tab if still tired.

## 2023-04-27 ENCOUNTER — Ambulatory Visit: Payer: No Typology Code available for payment source | Admitting: Dietician

## 2023-04-28 ENCOUNTER — Ambulatory Visit (INDEPENDENT_AMBULATORY_CARE_PROVIDER_SITE_OTHER): Payer: No Typology Code available for payment source

## 2023-04-28 DIAGNOSIS — Z6839 Body mass index (BMI) 39.0-39.9, adult: Secondary | ICD-10-CM | POA: Diagnosis not present

## 2023-04-28 DIAGNOSIS — M4802 Spinal stenosis, cervical region: Secondary | ICD-10-CM | POA: Diagnosis not present

## 2023-04-28 DIAGNOSIS — I495 Sick sinus syndrome: Secondary | ICD-10-CM | POA: Diagnosis not present

## 2023-04-28 LAB — CUP PACEART REMOTE DEVICE CHECK
Battery Impedance: 2496 Ohm
Battery Remaining Longevity: 42 mo
Battery Voltage: 2.73 V
Brady Statistic RA Percent Paced: 4 %
Date Time Interrogation Session: 20240828083227
Implantable Lead Connection Status: 753985
Implantable Lead Implant Date: 20091023
Implantable Lead Location: 753859
Implantable Lead Model: 5076
Implantable Pulse Generator Implant Date: 20091023
Lead Channel Impedance Value: 431 Ohm
Lead Channel Impedance Value: 67 Ohm
Lead Channel Setting Pacing Amplitude: 2 V
Zone Setting Status: 755011

## 2023-04-28 NOTE — Progress Notes (Deleted)
HPI   male former smoker followed for OSA, complicated by CAD/CHF/P A. fib/pacemaker/Pradaxa, allergic rhinitis, hyperthyroid,  degenerative disc disease, GERD NPSG 2010- AHI 21/ hr 04/09/2022 FENO - 11 04/09/2022 Spirometry >> FVC 3.1 (75%), FEV1 2.8 (86%), ratio 89. Clear CTA May 2023 Speech Therapy 10/18/22- no dysphagia. Persistent globus sense. Suspect self-sustaining habit. Coaching done. His cough has not responded to Rx for GERD, antihistamines, LAMA inhaler. ENT found no sinus disease. Allergy eval Dr Maurine Minister- Skin tests all neg for environmental and foods.Dx'd Upper Airway Cough Saw ENT/ Dr Jenne Pane 3 months ago> CT sinuses understood to be negative.  Pulmonary testing: 04/09/2022 FENO - 11 04/09/2022 Spirometry >> FVC 3.1 (75%), FEV1 2.8 (86%), ratio 89. Clear CTA May 2023, Allergy eval Dr Maurine Minister- Skin tests all neg for environmental and foods.Dx'd Upper Airway Cough. HST 02/10/23- AHI 39.8/ hr, desaturation to 84%, body weight 292 lbs ---------------------------------------------------------------------------------------   03/29/23- 62 year old male former smoker followed for OSA, Chronic Upper [place]way Cough(nonallergic), complicated by CAD/CHF/PA.fib/Pacemaker/Eliquis, allergic Rhinitis, Obesity,  hyperthyroid, degenerative disc disease, GERD, Hx PE 2004 CPAP auto 5-15/[facility]/ replaced in July 2022 Download-compliance - 87%, AHI 3.5/ hr Body weight today-292 lbs Referred to [facility] for "throat discomfort" HST 02/10/23- AHI 39.8/ hr, desaturation to 84%, body weight 292 lbs Discussed home sleep test. He will continue CPAP, work on weight, try otc caffeine tab He is frustrated that he always feels some congestion in nose and chest.  04/29/24- 62 year old male former smoker followed for OSA, Chronic Upper [place] Cough(nonallergic), complicated by CAD/CHF/PA.fib/Pacemaker/Eliquis, allergic Rhinitis, Obesity,  hyperthyroid, degenerative disc disease, GERD, Hx PE 2004 -Adderall 10 mg bid,  CPAP  auto 5-15/[facility]/ replaced in July 2022 Download-compliance -  Body weight today-    ROS-see HPI   + = positive Constitutional:    weight loss, night sweats, fevers, chills, fatigue, lassitude. HEENT:    headaches, difficulty swallowing, tooth/dental problems, sore throat,       sneezing, itching, ear ache, +nasal congestion, +post nasal drip, snoring CV:    +chest pain, orthopnea, PND, swelling in lower extremities, anasarca,                                                    dizziness, palpitations Resp:   +shortness of breath with exertion or at rest.                +productive cough,   non-productive cough, coughing up of blood.              +change in color of mucus.  wheezing.   Skin:    rash or lesions. GI:  No-   heartburn, indigestion, abdominal pain, nausea, vomiting GU: . MS:   joint pain, stiffness,  Neuro-     nothing unusual Psych:  change in mood or affect.  depression or anxiety.   memory loss.  OBJ- Physical Exam General- Alert, Oriented, Affect-+looks tired or maybe depressed, Distress- none acute, + obese Skin- rash-none, lesions- none, excoriation- none Lymphadenopathy- none Head- atraumatic            Eyes- Gross vision intact, PERRLA, conjunctivae and secretions clear            Ears- Hearing, canals-normal            Nose- , + turbinate edema R, no-Septal dev, mucus, polyps, erosion, perforation  Throat- Mallampati III-IV , mucosa clear/not dry , drainage- none seen, tonsils- atrophic, Not hoarse,  Neck- flexible , trachea midline, no stridor , thyroid nl, carotid no bruit Chest - symmetrical excursion , unlabored           Heart/CV- RRR , no murmur , no gallop  , no rub, nl s1 s2                           - JVD- none , edema- none, stasis changes- none, varices- none           Lung- clear to P&A, wheeze- none, cough- none , dullness-none, rub- none           Chest wall-+ L pacemaker Abd-  Br/ Gen/ Rectal- Not done, not indicated Extrem-  cyanosis- none, clubbing, none, atrophy- none, strength- nl. +Missing distal fingers R hand. Neuro- grossly intact to observation

## 2023-04-29 DIAGNOSIS — M170 Bilateral primary osteoarthritis of knee: Secondary | ICD-10-CM | POA: Diagnosis not present

## 2023-04-29 DIAGNOSIS — M19032 Primary osteoarthritis, left wrist: Secondary | ICD-10-CM | POA: Diagnosis not present

## 2023-04-29 DIAGNOSIS — R52 Pain, unspecified: Secondary | ICD-10-CM | POA: Diagnosis not present

## 2023-04-30 ENCOUNTER — Ambulatory Visit: Payer: Medicaid Other | Admitting: Internal Medicine

## 2023-05-07 DIAGNOSIS — M19032 Primary osteoarthritis, left wrist: Secondary | ICD-10-CM | POA: Diagnosis not present

## 2023-05-07 NOTE — Progress Notes (Signed)
Remote pacemaker transmission.   

## 2023-05-14 ENCOUNTER — Other Ambulatory Visit: Payer: Self-pay | Admitting: Nurse Practitioner

## 2023-05-17 NOTE — Telephone Encounter (Signed)
Contacted patient and patient is still experiencing reflux. Please advise.

## 2023-05-25 DIAGNOSIS — H5789 Other specified disorders of eye and adnexa: Secondary | ICD-10-CM | POA: Diagnosis not present

## 2023-05-25 DIAGNOSIS — G4700 Insomnia, unspecified: Secondary | ICD-10-CM | POA: Diagnosis not present

## 2023-05-26 DIAGNOSIS — M47812 Spondylosis without myelopathy or radiculopathy, cervical region: Secondary | ICD-10-CM | POA: Diagnosis not present

## 2023-05-26 DIAGNOSIS — M25511 Pain in right shoulder: Secondary | ICD-10-CM | POA: Diagnosis not present

## 2023-06-11 DIAGNOSIS — M25511 Pain in right shoulder: Secondary | ICD-10-CM | POA: Diagnosis not present

## 2023-06-17 DIAGNOSIS — M170 Bilateral primary osteoarthritis of knee: Secondary | ICD-10-CM | POA: Diagnosis not present

## 2023-06-26 DIAGNOSIS — I480 Paroxysmal atrial fibrillation: Secondary | ICD-10-CM | POA: Diagnosis not present

## 2023-06-26 DIAGNOSIS — Z87898 Personal history of other specified conditions: Secondary | ICD-10-CM | POA: Diagnosis not present

## 2023-06-26 DIAGNOSIS — H269 Unspecified cataract: Secondary | ICD-10-CM | POA: Diagnosis not present

## 2023-06-26 DIAGNOSIS — H0014 Chalazion left upper eyelid: Secondary | ICD-10-CM | POA: Diagnosis not present

## 2023-06-26 DIAGNOSIS — E6609 Other obesity due to excess calories: Secondary | ICD-10-CM | POA: Diagnosis not present

## 2023-06-26 DIAGNOSIS — Z0130 Encounter for examination of blood pressure without abnormal findings: Secondary | ICD-10-CM | POA: Diagnosis not present

## 2023-06-26 DIAGNOSIS — R030 Elevated blood-pressure reading, without diagnosis of hypertension: Secondary | ICD-10-CM | POA: Diagnosis not present

## 2023-06-26 DIAGNOSIS — Z6839 Body mass index (BMI) 39.0-39.9, adult: Secondary | ICD-10-CM | POA: Diagnosis not present

## 2023-07-08 DIAGNOSIS — Z6839 Body mass index (BMI) 39.0-39.9, adult: Secondary | ICD-10-CM | POA: Diagnosis not present

## 2023-07-08 DIAGNOSIS — M48061 Spinal stenosis, lumbar region without neurogenic claudication: Secondary | ICD-10-CM | POA: Diagnosis not present

## 2023-07-14 ENCOUNTER — Encounter: Payer: Self-pay | Admitting: Gastroenterology

## 2023-07-28 ENCOUNTER — Ambulatory Visit: Payer: Medicaid Other

## 2023-07-28 DIAGNOSIS — I495 Sick sinus syndrome: Secondary | ICD-10-CM | POA: Diagnosis not present

## 2023-08-03 DIAGNOSIS — M7061 Trochanteric bursitis, right hip: Secondary | ICD-10-CM | POA: Diagnosis not present

## 2023-08-03 DIAGNOSIS — M48061 Spinal stenosis, lumbar region without neurogenic claudication: Secondary | ICD-10-CM | POA: Diagnosis not present

## 2023-08-03 DIAGNOSIS — M4802 Spinal stenosis, cervical region: Secondary | ICD-10-CM | POA: Diagnosis not present

## 2023-08-03 DIAGNOSIS — M47812 Spondylosis without myelopathy or radiculopathy, cervical region: Secondary | ICD-10-CM | POA: Diagnosis not present

## 2023-08-03 DIAGNOSIS — M7062 Trochanteric bursitis, left hip: Secondary | ICD-10-CM | POA: Diagnosis not present

## 2023-08-04 ENCOUNTER — Ambulatory Visit: Payer: Medicaid Other

## 2023-08-04 DIAGNOSIS — I495 Sick sinus syndrome: Secondary | ICD-10-CM

## 2023-08-04 LAB — CUP PACEART REMOTE DEVICE CHECK
Battery Impedance: 2841 Ohm
Battery Remaining Longevity: 37 mo
Battery Voltage: 2.71 V
Brady Statistic RA Percent Paced: 3 %
Date Time Interrogation Session: 20241203123507
Implantable Lead Connection Status: 753985
Implantable Lead Implant Date: 20091023
Implantable Lead Location: 753859
Implantable Lead Model: 5076
Implantable Pulse Generator Implant Date: 20091023
Lead Channel Impedance Value: 444 Ohm
Lead Channel Impedance Value: 67 Ohm
Lead Channel Setting Pacing Amplitude: 2 V
Zone Setting Status: 755011

## 2023-08-14 DIAGNOSIS — I509 Heart failure, unspecified: Secondary | ICD-10-CM | POA: Diagnosis not present

## 2023-08-14 DIAGNOSIS — R059 Cough, unspecified: Secondary | ICD-10-CM | POA: Diagnosis not present

## 2023-08-14 DIAGNOSIS — B974 Respiratory syncytial virus as the cause of diseases classified elsewhere: Secondary | ICD-10-CM | POA: Diagnosis not present

## 2023-08-14 DIAGNOSIS — R0989 Other specified symptoms and signs involving the circulatory and respiratory systems: Secondary | ICD-10-CM | POA: Diagnosis not present

## 2023-08-14 DIAGNOSIS — Z950 Presence of cardiac pacemaker: Secondary | ICD-10-CM | POA: Diagnosis not present

## 2023-08-14 DIAGNOSIS — I447 Left bundle-branch block, unspecified: Secondary | ICD-10-CM | POA: Diagnosis not present

## 2023-08-14 DIAGNOSIS — R9431 Abnormal electrocardiogram [ECG] [EKG]: Secondary | ICD-10-CM | POA: Diagnosis not present

## 2023-08-14 DIAGNOSIS — R079 Chest pain, unspecified: Secondary | ICD-10-CM | POA: Diagnosis not present

## 2023-08-14 DIAGNOSIS — I4891 Unspecified atrial fibrillation: Secondary | ICD-10-CM | POA: Diagnosis not present

## 2023-08-14 DIAGNOSIS — I110 Hypertensive heart disease with heart failure: Secondary | ICD-10-CM | POA: Diagnosis not present

## 2023-08-14 DIAGNOSIS — Z7901 Long term (current) use of anticoagulants: Secondary | ICD-10-CM | POA: Diagnosis not present

## 2023-08-14 DIAGNOSIS — J029 Acute pharyngitis, unspecified: Secondary | ICD-10-CM | POA: Diagnosis not present

## 2023-08-14 DIAGNOSIS — Z87891 Personal history of nicotine dependence: Secondary | ICD-10-CM | POA: Diagnosis not present

## 2023-08-14 DIAGNOSIS — I2510 Atherosclerotic heart disease of native coronary artery without angina pectoris: Secondary | ICD-10-CM | POA: Diagnosis not present

## 2023-08-14 DIAGNOSIS — I252 Old myocardial infarction: Secondary | ICD-10-CM | POA: Diagnosis not present

## 2023-08-17 ENCOUNTER — Other Ambulatory Visit: Payer: Self-pay | Admitting: Internal Medicine

## 2023-08-17 DIAGNOSIS — J209 Acute bronchitis, unspecified: Secondary | ICD-10-CM | POA: Diagnosis not present

## 2023-08-17 DIAGNOSIS — I480 Paroxysmal atrial fibrillation: Secondary | ICD-10-CM

## 2023-08-17 DIAGNOSIS — I10 Essential (primary) hypertension: Secondary | ICD-10-CM | POA: Diagnosis not present

## 2023-08-17 NOTE — Telephone Encounter (Signed)
Prescription refill request for Eliquis received. Indication: PAF Last office visit:  08/19/22  Odessa Fleming MD Scr: 1.15 on 08/14/23  Epic Age: 62 Weight: 135.6kg  Based on above findings Eliquis 5mg  twice daily is the appropriate dose.  Pt is due to see Dr Graciela Husbands this month.  Message sent to schedulers to make appt.  Refill approved x 1.

## 2023-08-20 DIAGNOSIS — G4733 Obstructive sleep apnea (adult) (pediatric): Secondary | ICD-10-CM | POA: Diagnosis not present

## 2023-08-26 DIAGNOSIS — G4733 Obstructive sleep apnea (adult) (pediatric): Secondary | ICD-10-CM | POA: Diagnosis not present

## 2023-09-08 ENCOUNTER — Telehealth: Payer: Self-pay

## 2023-09-08 NOTE — Telephone Encounter (Signed)
   Name: [name]  DOB: [birth year]  MRN: [id number]  Primary Cardiologist: Dr. Fernande  Chart reviewed as part of pre-operative protocol coverage. Because of [name]'s past medical history and time since last visit, he will require a follow-up in-office visit in order to better assess preoperative cardiovascular risk. Patient follows with EP team so appointment should be with their team given overdue pacemaker follow-up as well. Given this is only injection, EP team should be able to assess cardiac clearance.  Pre-op  covering staff: - Please schedule appointment and call patient to inform them. If patient already had an upcoming appointment within acceptable timeframe, please add pre-op  clearance to the appointment notes so provider is aware. - Please contact requesting surgeon's office via preferred method (i.e, phone, fax) to inform them of need for appointment prior to surgery.  This message will also be routed to pharmacy pool for input on anticoag as requested below so that this information is available to the clearing provider at time of patient's appointment.   Stanislav Gervase N Keisha Amer, PA-C  09/08/2023, 3:59 PM

## 2023-09-08 NOTE — Telephone Encounter (Signed)
   Pre-operative Risk Assessment    Patient Name: [name]  DOB: 09/25/1960 MRN: [id number]   Date of last office visit: 08/19/22 Date of next office visit: Not scheduled   Request for Surgical Clearance    Procedure:   CESI C7-T1  Date of Surgery:  Clearance TBD                                Surgeon:  Dr. Darlis Socks Group or Practice Name:  [facility] Phone number:  [phone] Fax number:  [phone]   Type of Clearance Requested:   - Medical  - Pharmacy:  Hold Apixaban  (Eliquis ) x 3 days prior   Type of Anesthesia:  None    Additional requests/questions:    Bonney Ival LOISE Gerome   09/08/2023, 2:53 PM

## 2023-09-10 ENCOUNTER — Ambulatory Visit: Payer: Medicaid Other | Attending: Internal Medicine | Admitting: Internal Medicine

## 2023-09-10 ENCOUNTER — Encounter: Payer: Self-pay | Admitting: Internal Medicine

## 2023-09-10 VITALS — BP 108/76 | HR 67 | Ht 71.0 in | Wt 280.6 lb

## 2023-09-10 DIAGNOSIS — Z950 Presence of cardiac pacemaker: Secondary | ICD-10-CM

## 2023-09-10 DIAGNOSIS — I480 Paroxysmal atrial fibrillation: Secondary | ICD-10-CM | POA: Diagnosis not present

## 2023-09-10 DIAGNOSIS — Z79899 Other long term (current) drug therapy: Secondary | ICD-10-CM

## 2023-09-10 DIAGNOSIS — Z01818 Encounter for other preprocedural examination: Secondary | ICD-10-CM | POA: Diagnosis not present

## 2023-09-10 LAB — MAGNESIUM: Magnesium: 2.2 mg/dL (ref 1.6–2.3)

## 2023-09-10 NOTE — Telephone Encounter (Signed)
 Patient with diagnosis of afib on Eliquis  for anticoagulation.    Procedure: CESI C7-T1  Date of procedure: TBD   CHA2DS2-VASc Score = 3   This indicates a 3.2% annual risk of stroke. The patient's score is based upon: CHF History: 1 HTN History: 1 Diabetes History: 0 Stroke History: 0 Vascular Disease History: 1 Age Score: 0 Gender Score: 0      CrCl 88 ml/min Platelet count 233  Per office protocol, patient can hold Eliquis  for 3 days prior to procedure.  .  **This guidance is not considered finalized until pre-operative APP has relayed final recommendations.**

## 2023-09-10 NOTE — Patient Instructions (Addendum)
 Medication Instructions:  Your physician recommends that you continue on your current medications as directed. Please refer to the Current Medication list given to you today.  *If you need a refill on your cardiac medications before your next appointment, please call your pharmacy*   Lab Work: Mg level today  If you have labs (blood work) drawn today and your tests are completely normal, you will receive your results only by: MyChart Message (if you have MyChart) OR A paper copy in the mail If you have any lab test that is abnormal or we need to change your treatment, we will call you to review the results.   Testing/Procedures: None ordered.    Follow-Up: At [facility], you and your health needs are our priority.  As part of our continuing mission to provide you with exceptional heart care, we have created designated Provider Care Teams.  These Care Teams include your primary Cardiologist (physician) and Advanced Practice Providers (APPs -  Physician Assistants and Nurse Practitioners) who all work together to provide you with the care you need, when you need it.  We recommend signing up for the patient portal called MyChart.  Sign up information is provided on this After Visit Summary.  MyChart is used to connect with patients for Virtual Visits (Telemedicine).  Patients are able to view lab/test results, encounter notes, upcoming appointments, etc.  Non-urgent messages can be sent to your provider as well.   To learn more about what you can do with MyChart, go to [email or web address].    Your next appointment:   6 months with Dr Celine PA

## 2023-09-10 NOTE — Progress Notes (Signed)
 I did not see that Patient Care Team: [facility] as PCP - [place] [name], MD (Inactive) as PCP - Cardiology (Cardiology) [name], MD (Inactive) as PCP - Electrophysiology (Cardiology) [name], MD (Gastroenterology) [place] Ferebee Desai, [name], PA-C as Physician Assistant (Neurosurgery) [name], MD (Inactive) as Consulting Physician (Anesthesiology)   HPI  [name] is a 63 y.o. male Seen in followup for pacemaker--dual-chamber Medtronic with a ventricular port plugged  implanted for sinus node dysfunction and paroxysmal atrial arrhythmias which persisted despite dofetilide . He underwent catheter ablation of atrial fibrillation 2012; repeat ablation 9/18 (Dr. MILUS) maintained on dofetilide   History in the past of treated hyperthyroidism  Ischemic heart disease with prior non-STEMI possibly type II as there was  no significant obstructive lesions noted. Catheterization was done in 2006 and 2009  showing nonobstructive disease. A Myoview  scan 2011 was nonischemic.  Hx of hypothyroidism, CT 5/23>> abnormal thyroid , U/S recommended not done   Thyroid  followed at [facility]   History of pulmonary embolism     The patient denies chest pain, shortness of breath, nocturnal dyspnea, orthopnea or peripheral edema.  There have been no palpitations, lightheadedness or syncope.  Complains of back and shoulder pain .   DATE TEST EF   11*/12 Echo  45-50 %   8/17    Echo  45-50 %   9/17 CATH 45% No obstructive CAD  3/22 Echo  60-65%   5/23      Date Cr K Mg TSH Hgb  8/18    0.42-0.297  17.1>14.9  3/20 0.94 4.6 2.2     5/23 1.07 4.0   14.0  12}/23 1.14 4.9      12/24 1.15 4.7   14.7     Past Medical History:  Diagnosis Date   Atrial fibrillation [facility])    s/p afib ablation  PVI 5/12 JA   CAD -nonobstructive    S/P nstemi-type II cath 05/2008 - nonobs. dzs.   Cataract    left eye 2019   Colon polyps    Complication of anesthesia    last 2  surgeries pt had some respiratory issues following surgery required ED visit, dx with bronchitis.    DDD (degenerative disc disease)    evaluate by neurosurgery is ongoing   Depression    ptsd   Duodenal adenoma    Erectile dysfunction    GERD (gastroesophageal reflux disease)    Headache(784.0)    Hemorrhoids    Hiatal hernia    HTN (hypertension)    Hyperlipemia    Hyperthyroidism    Graves dz on methimazole  and followed by Dr Kassie   Myocardial infarction [facility])    2009   Obesity    Pancreatitis    Polysubstance abuse (HCC)    cocaine, last used 1999   Presence of permanent cardiac pacemaker    since 2009   PUD (peptic ulcer disease)    gastritis due to ETOH previously   Pulmonary embolism (HCC) 04/2003   Hx of   Rectal bleeding    Sinus node dysfunction (HCC)    s/p MDT PPM - AAI - V port plugged   Sleep apnea    uses CPAP [facility] 2 YRS   Systolic and diastolic CHF, chronic (HCC)    EF 35% by most recent echo 09/19/10   Tooth absence 2014   recent ly had tooth pulled painful    Past Surgical History:  Procedure Laterality Date   ANTERIOR CERVICAL  DECOMP/DISCECTOMY FUSION  12/15/2011   Procedure: ANTERIOR CERVICAL DECOMPRESSION/DISCECTOMY FUSION 1 LEVEL;  Surgeon: Victory DELENA Gunnels, MD;  Location: [facility]UR[facility];  Service: Neurosurgery;  Laterality: N/A;  Cervical Six-Seven Anterior Cervical Diskectomy fusion with Allograft and Plating   ANTERIOR CERVICAL DECOMP/DISCECTOMY FUSION N/A 01/01/2020   Procedure: Anterior Cervical Discectomy Fusion - Cervical Seven- Thoracic One;  Surgeon: Gunnels Victory, MD;  Location: [facility] OR;  Service: Neurosurgery;  Laterality: N/A;  Anterior Cervical Discectomy Fusion - Cervical Seven- Thoracic One   ATRIAL ABLATION SURGERY  12/30/2010   afib ablation by Dr Kelsie   ATRIAL FIBRILLATION ABLATION N/A 05/06/2017   Procedure: Atrial Fibrillation Ablation;  Surgeon: [name], MD;  Location: [facility] INVASIVE CV LAB;  Service: Cardiovascular;   Laterality: N/A;   BACK SURGERY     CARDIAC CATHETERIZATION     CADIAC ABLATION DR KELSIE 12/2010   CARDIAC CATHETERIZATION N/A 05/08/2016   Procedure: Left Heart Cath and Coronary Angiography;  Surgeon: Peter M Jordan, MD;  Location: [facility] INVASIVE CV LAB;  Service: Cardiovascular;  Laterality: N/A;   Fingers removed from right hand.  10/01/1982   traumatic work injury   INSERT / REPLACE / REMOVE PACEMAKER     05/2008     KNEE ARTHROSCOPY Left 06/03/2021   LUMBAR SPINE SURGERY  02/28/2009   Dr. Malcolm   PACEMAKER INSERTION  05/31/2008   by Dr Fernande   TEE WITHOUT CARDIOVERSION N/A 05/05/2017   Procedure: TRANSESOPHAGEAL ECHOCARDIOGRAM (TEE);  Surgeon: Rolan Ezra RAMAN, MD;  Location: [facility] ENDOSCOPY;  Service: Cardiovascular;  Laterality: N/A;    Current Outpatient Medications  Medication Sig Dispense Refill   apixaban  (ELIQUIS ) 5 MG TABS tablet Take 1 tablet (5 mg total) by mouth 2 (two) times daily. Pt needs appt with Dr Fernande for future refills. 180 tablet 0   atorvastatin  (LIPITOR) 40 MG tablet Take 40 mg by mouth daily.     carvedilol  (COREG ) 25 MG tablet TAKE 1 TABLET BY MOUTH TWICE A DAY WITH FOOD 180 tablet 2   desonide  (DESOWEN ) 0.05 % cream Apply 1 application topically 2 (two) times daily as needed (for rash).      dexlansoprazole  (DEXILANT ) 60 MG capsule TAKE 1 CAPSULE BY MOUTH EVERY DAY 90 capsule 1   diclofenac Sodium (VOLTAREN) 1 % GEL Apply 4 g topically 4 (four) times daily.     dofetilide  (TIKOSYN ) 500 MCG capsule TAKE 1 CAPSULE BY MOUTH 2 TIMES DAILY. 180 capsule 3   fluticasone  (FLONASE ) 50 MCG/ACT nasal spray Place into both nostrils.     gabapentin  (NEURONTIN ) 100 MG capsule Take 1-2 capsules (100-200 mg total) by mouth See admin instructions. Patient takes 1 tablet in the morning and 2 tablets at night (Patient taking differently: Take 100-200 mg by mouth See admin instructions. Patient takes 2 tablets at night) 60 capsule 3   loratadine  (CLARITIN ) 10 MG tablet TAKE 1  TABLET BY MOUTH EVERY DAY 30 tablet PRN   methimazole  (TAPAZOLE ) 5 MG tablet Take 1 tablet by mouth daily.     olmesartan (BENICAR) 20 MG tablet Take 20 mg by mouth daily.     oxyCODONE -acetaminophen  (PERCOCET) 10-325 MG tablet Take 1 tablet by mouth 4 (four) times daily as needed.     triamcinolone  ointment (KENALOG) 0.1 % APPLY MODERATE AMOUNT TO AFFECTED AREA DAILY RASH     No current facility-administered medications for this visit.    Allergies  Allergen Reactions   Celecoxib Itching   Hydrocodone  Itching   Prednisolone Itching  Potassium Chloride  Other (See Comments)    Dizziness and nausea     Review of Systems negative except from HPI and PMH  Physical Exam BP 108/76   Pulse 67   Ht 5' 11 (1.803 m)   Wt 280 lb 9.6 oz (127.3 kg)   SpO2 96%   BMI 39.14 kg/m   Well developed and well nourished in no acute distress HENT normal Neck supple with JVP-flat Clear Device pocket well healed; without hematoma or erythema.  There is no tethering  Regular rate and rhythm, no  gallop No / murmur Abd-soft with active BS No Clubbing cyanosis  edema Skin-warm and dry A & Oriented  Grossly normal sensory and motor function  ECG sinus @ 68 17/13/43  Device function is normal. Programming changes none  See Paceart for details       Assessment and  Plan  Syncope   Atrial fibrillation persistent with rapid conduction  S/p PVI 2102, 2018   Sinus node dysfunction  High Risk Medication Surveillance- Dofetilide    Pacemaker-Medtronic -dual-chamber with ventricular port plugged--AAI   GERD  Treated hyperthyroidism  Preinjection clearance  No interval syncope.  No interval atrial fibrillation but some nonsustained atrial tachycardia, continue dofetilide  and will check magnesium, otherwise normal surveillance laboratories.  Continue Eliquis  and will check CBC.  No prior strokes.  Withholding his Eliquis  x 3d  in anticipation of his injection is appropriate; risks  should be minimal

## 2023-09-21 ENCOUNTER — Ambulatory Visit: Payer: No Typology Code available for payment source | Admitting: Adult Health

## 2023-09-29 NOTE — Progress Notes (Signed)
HPI   male former smoker followed for OSA, complicated by CAD/CHF/P A. fib/pacemaker/Pradaxa, allergic rhinitis, hyperthyroid,  degenerative disc disease, GERD NPSG 2010- AHI 21/ hr 04/09/2022 FENO - 11 04/09/2022 Spirometry >> FVC 3.1 (75%), FEV1 2.8 (86%), ratio 89. Clear CTA May 2023 Speech Therapy 10/18/22- no dysphagia. Persistent globus sense. Suspect self-sustaining habit. Coaching done. His cough has not responded to Rx for GERD, antihistamines, LAMA inhaler. ENT found no sinus disease. Allergy eval Dr Maurine Minister- Skin tests all neg for environmental and foods.Dx'd Upper Airway Cough Saw ENT/ Dr Jenne Pane 2023> CT sinuses understood to be negative.  Pulmonary testing: 04/09/2022 FENO - 11 04/09/2022 Spirometry >> FVC 3.1 (75%), FEV1 2.8 (86%), ratio 89. Clear CTA May 2023, Allergy eval Dr Maurine Minister- Skin tests all neg for environmental and foods.Dx'd Upper Airway Cough. HST 02/10/23- AHI 39.8/ hr, desaturation to 84%, body weight 292 lbs ---------------------------------------------------------------------------------------   03/29/23- 63 year old male former smoker followed for OSA, Chronic [place] Cough(nonallergic), complicated by CAD/CHF/PA.fib/Pacemaker/Eliquis, allergic Rhinitis, Obesity,  hyperthyroid, degenerative disc disease, GERD, Hx PE 2004 CPAP auto 5-15/[employer]/ replaced in July 2022 Download-compliance - 87%, AHI 3.5/ hr Body weight today-292 lbs Referred to [facility] for "throat discomfort" HST 02/10/23- AHI 39.8/ hr, desaturation to 84%, body weight 292 lbs Discussed home sleep test. He will continue CPAP, work on weight, try otc caffeine tab He is frustrated that he always feels some congestion in nose and chest.  10/01/23-  63 year old male former smoker followed for OSA/ quit CPAP, Chronic Upper [place] Cough(nonallergic), complicated by CAD/CHF/PA.fib/Pacemaker/Eliquis, allergic Rhinitis, Obesity,  hyperthyroid, degenerative disc disease, GERD, Hx PE 2004 CPAP auto 5-15/[facility]/  replaced in July 2022 Download-compliance - Body weight today-                                                            Has he had a thyroid ultrasound??? CT C-spine 03/23/23 IMPRESSION: 1. Postsurgical changes from C6-T1 ACDF with solid osseous fusion. No evidence of hardware complications 2. Multilevel degenerative changes of the cervical spine, as described above. There is severe left and moderate right neural foraminal narrowing at C3-C4 and T1-T2. Moderate bilateral neural foraminal narrowing at C4-C5. 3. Enlarged and multinodular thyroid goiter, unchanged from prior exam.  Discussed the use of AI scribe software for clinical note transcription with the patient, who gave verbal consent to proceed. History of Present Illness   The patient, with a history of cervical spine fusion and an enlarged thyroid gland, presents with persistent phlegm build up in his throat. He reports that the phlegm build up was attributed to the fusion surgery and the enlarged thyroid gland by his spine doctor. He also mentions that his endocrinologist recently decreased his thyroid medication from 10mg  to 5mg . He does not recall ever having an ultrasound of his thyroid.  In addition, the patient recently recovered from an RSV infection, which had him feeling unwell for a month. He received a steroid shot, an antibiotic shot, and a week-long course of antibiotics for the infection. He also mentions that he is out of loratadine and is scheduled for a neck injection for pain shooting down his shoulders.   ROS-see HPI   + = positive Constitutional:    weight loss, night sweats, fevers, chills, fatigue, lassitude. HEENT:    headaches, difficulty swallowing, tooth/dental problems,  sore throat,       sneezing, itching, ear ache, +nasal congestion, +post nasal drip, snoring CV:    +chest pain, orthopnea, PND, swelling in lower extremities, anasarca,                                                    dizziness,  palpitations Resp:   +shortness of breath with exertion or at rest.                +productive cough,   non-productive cough, coughing up of blood.              +change in color of mucus.  wheezing.   Skin:    rash or lesions. GI:  No-   heartburn, indigestion, abdominal pain, nausea, vomiting GU: . MS:   joint pain, stiffness,  Neuro-     nothing unusual Psych:  change in mood or affect.  depression or anxiety.   memory loss.  OBJ- Physical Exam General- Alert, Oriented, Affect-+looks tired or maybe depressed, Distress- none acute, + obese Skin- rash-none, lesions- none, excoriation- none Lymphadenopathy- none Head- atraumatic            Eyes- Gross vision intact, PERRLA, conjunctivae and secretions clear            Ears- Hearing, canals-normal            Nose- , + turbinate edema R, no-Septal dev, mucus, polyps, erosion, perforation             Throat- Mallampati III-IV , mucosa clear/not dry , drainage- none seen, tonsils- atrophic, Not hoarse,  Neck- flexible , trachea midline, no stridor , thyroid nl, carotid no bruit Chest - symmetrical excursion , unlabored           Heart/CV- RRR , no murmur , no gallop  , no rub, nl s1 s2                           - JVD- none , edema- none, stasis changes- none, varices- none           Lung- clear to P&A, wheeze- none, cough- none , dullness-none, rub- none           Chest wall-+ L pacemaker Abd-  Br/ Gen/ Rectal- Not done, not indicated Extrem- cyanosis- none, clubbing, none, atrophy- none, strength- nl. +Missing distal fingers R hand. Neuro- grossly intact to observation  Assessment and Plan    Obstructive Sleep Apnea Compliant with CPAP.  His SD card hadn't been reinserted. He will place it when he gets home, so next visit he can bring for download.   Enlarged Thyroid Gland Noted on CT scan, currently managed by endocrinologist with medication reduction from 10mg  to 5mg . No ultrasound of thyroid performed to date. -Recommend patient  to ask endocrinologist about the need for thyroid ultrasound.  Phlegm Buildup Likely due to crowding from cervical spine fusion and enlarged thyroid gland. -No changes to current management plan.  Recent RSV Infection Recovered from severe infection last month. Vaccination not needed this year due to recent infection. -Consider RSV vaccination next fall to prevent future infections.  Neck Pain Scheduled for injection therapy for pain radiating down shoulders from neck. -Continue with planned injection therapy.  Refill of Loratadine Requested by patient. -Refill  Loratadine prescription, send to [facility].  Follow-up No immediate concerns. -Schedule next routine visit in 1 year, sooner if issues arise.

## 2023-09-30 ENCOUNTER — Telehealth: Payer: Self-pay | Admitting: *Deleted

## 2023-09-30 NOTE — Telephone Encounter (Signed)
Called and spoke with patient, advised to bring SD card with him from his CPAP machine for his visit tomorrow.  He stated he would look and see if it was put back in when he was last here.  Nothing further needed.

## 2023-10-01 ENCOUNTER — Encounter: Payer: Self-pay | Admitting: Internal Medicine

## 2023-10-01 ENCOUNTER — Ambulatory Visit: Payer: Medicaid Other | Admitting: Internal Medicine

## 2023-10-01 ENCOUNTER — Telehealth: Payer: Self-pay | Admitting: Internal Medicine

## 2023-10-01 DIAGNOSIS — J3089 Other allergic rhinitis: Secondary | ICD-10-CM | POA: Diagnosis not present

## 2023-10-01 DIAGNOSIS — G4733 Obstructive sleep apnea (adult) (pediatric): Secondary | ICD-10-CM

## 2023-10-01 MED ORDER — LORATADINE 10 MG PO TABS: 10.0000 mg | ORAL_TABLET | Freq: Every day | ORAL | 99 refills | Status: DC

## 2023-10-01 NOTE — Patient Instructions (Signed)
When you get home, insert your SD card into your CPAP machine so it can be recording for next time. That helps [name] make sure your CPAP is set right and working for you.  Remember to ask your endocrinologist if you should have an ultrasound of your thyroid goiter. It is taking up space in your neck and probably contributes, along with your previous neck surgeries, to the discomforts in your neck.

## 2023-10-01 NOTE — Telephone Encounter (Signed)
error 

## 2023-10-05 DIAGNOSIS — M1712 Unilateral primary osteoarthritis, left knee: Secondary | ICD-10-CM | POA: Diagnosis not present

## 2023-10-13 DIAGNOSIS — E559 Vitamin D deficiency, unspecified: Secondary | ICD-10-CM | POA: Diagnosis not present

## 2023-10-13 DIAGNOSIS — Z79899 Other long term (current) drug therapy: Secondary | ICD-10-CM | POA: Diagnosis not present

## 2023-10-13 DIAGNOSIS — D539 Nutritional anemia, unspecified: Secondary | ICD-10-CM | POA: Diagnosis not present

## 2023-10-13 DIAGNOSIS — Z0000 Encounter for general adult medical examination without abnormal findings: Secondary | ICD-10-CM | POA: Diagnosis not present

## 2023-10-13 DIAGNOSIS — I10 Essential (primary) hypertension: Secondary | ICD-10-CM | POA: Diagnosis not present

## 2023-10-13 DIAGNOSIS — R5383 Other fatigue: Secondary | ICD-10-CM | POA: Diagnosis not present

## 2023-10-13 DIAGNOSIS — I252 Old myocardial infarction: Secondary | ICD-10-CM | POA: Diagnosis not present

## 2023-10-14 DIAGNOSIS — M5412 Radiculopathy, cervical region: Secondary | ICD-10-CM | POA: Diagnosis not present

## 2023-10-22 ENCOUNTER — Other Ambulatory Visit: Payer: Self-pay | Admitting: Internal Medicine

## 2023-10-22 ENCOUNTER — Other Ambulatory Visit: Payer: Self-pay | Admitting: Nurse Practitioner

## 2023-10-22 ENCOUNTER — Other Ambulatory Visit: Payer: Self-pay | Admitting: Gastroenterology

## 2023-10-22 DIAGNOSIS — I480 Paroxysmal atrial fibrillation: Secondary | ICD-10-CM

## 2023-10-25 NOTE — Telephone Encounter (Signed)
 Attempted to contact patient and left voicemail to return call to schedule an appointment.

## 2023-10-25 NOTE — Telephone Encounter (Signed)
 Prescription refill request for Eliquis received. Indication:afib Last office visit:1/25 Scr:1.15  12/24 Age: 63 Weight:127.8  kg  Prescription refilled

## 2023-10-27 DIAGNOSIS — I495 Sick sinus syndrome: Secondary | ICD-10-CM

## 2023-10-28 DIAGNOSIS — M1611 Unilateral primary osteoarthritis, right hip: Secondary | ICD-10-CM | POA: Diagnosis not present

## 2023-10-28 DIAGNOSIS — M1712 Unilateral primary osteoarthritis, left knee: Secondary | ICD-10-CM | POA: Diagnosis not present

## 2023-11-03 DIAGNOSIS — M5412 Radiculopathy, cervical region: Secondary | ICD-10-CM | POA: Diagnosis not present

## 2023-11-03 DIAGNOSIS — M5416 Radiculopathy, lumbar region: Secondary | ICD-10-CM | POA: Diagnosis not present

## 2023-11-08 ENCOUNTER — Other Ambulatory Visit: Payer: Self-pay | Admitting: Neurosurgery

## 2023-11-08 DIAGNOSIS — M5416 Radiculopathy, lumbar region: Secondary | ICD-10-CM

## 2023-11-08 DIAGNOSIS — M5412 Radiculopathy, cervical region: Secondary | ICD-10-CM

## 2023-11-17 DIAGNOSIS — G894 Chronic pain syndrome: Secondary | ICD-10-CM | POA: Diagnosis not present

## 2023-11-17 NOTE — Discharge Instructions (Signed)

## 2023-11-18 ENCOUNTER — Ambulatory Visit
Admission: RE | Admit: 2023-11-18 | Discharge: 2023-11-18 | Disposition: A | Discharge status: Home / Self Care | Source: Ambulatory Visit | Attending: Neurosurgery | Admitting: Neurosurgery

## 2023-11-18 DIAGNOSIS — M5412 Radiculopathy, cervical region: Secondary | ICD-10-CM

## 2023-11-18 DIAGNOSIS — Z981 Arthrodesis status: Secondary | ICD-10-CM | POA: Diagnosis not present

## 2023-11-18 DIAGNOSIS — M4802 Spinal stenosis, cervical region: Secondary | ICD-10-CM | POA: Diagnosis not present

## 2023-11-18 DIAGNOSIS — M5416 Radiculopathy, lumbar region: Secondary | ICD-10-CM

## 2023-11-18 DIAGNOSIS — M4807 Spinal stenosis, lumbosacral region: Secondary | ICD-10-CM | POA: Diagnosis not present

## 2023-11-18 DIAGNOSIS — M5117 Intervertebral disc disorders with radiculopathy, lumbosacral region: Secondary | ICD-10-CM | POA: Diagnosis not present

## 2023-11-18 DIAGNOSIS — M48061 Spinal stenosis, lumbar region without neurogenic claudication: Secondary | ICD-10-CM | POA: Diagnosis not present

## 2023-11-18 MED ORDER — DIAZEPAM 5 MG PO TABS
10.0000 mg | ORAL_TABLET | Freq: Once | ORAL | Status: AC
  Administered 2023-11-18: 10 mg via ORAL

## 2023-11-18 MED ORDER — MEPERIDINE HCL 50 MG/ML IJ SOLN: 50.0000 mg | Freq: Once | INTRAMUSCULAR | Status: DC | PRN

## 2023-11-18 MED ORDER — DIAZEPAM 5 MG PO TABS: 5.0000 mg | ORAL_TABLET | Freq: Once | ORAL | Status: DC

## 2023-11-18 MED ORDER — ONDANSETRON HCL 4 MG/2ML IJ SOLN: 4.0000 mg | Freq: Once | INTRAMUSCULAR | Status: DC | PRN

## 2023-11-18 MED ORDER — IOPAMIDOL (ISOVUE-M 300) INJECTION 61%
10.0000 mL | Freq: Once | INTRAMUSCULAR | Status: AC
  Administered 2023-11-18: 10 mL via INTRATHECAL

## 2023-11-19 ENCOUNTER — Other Ambulatory Visit: Payer: Self-pay | Admitting: Gastroenterology

## 2023-11-23 DIAGNOSIS — G4730 Sleep apnea, unspecified: Secondary | ICD-10-CM | POA: Diagnosis not present

## 2023-11-23 DIAGNOSIS — I10 Essential (primary) hypertension: Secondary | ICD-10-CM | POA: Diagnosis not present

## 2023-11-23 DIAGNOSIS — I252 Old myocardial infarction: Secondary | ICD-10-CM | POA: Diagnosis not present

## 2023-11-23 DIAGNOSIS — R7303 Prediabetes: Secondary | ICD-10-CM | POA: Diagnosis not present

## 2023-11-23 DIAGNOSIS — E6609 Other obesity due to excess calories: Secondary | ICD-10-CM | POA: Diagnosis not present

## 2023-11-25 DIAGNOSIS — M5416 Radiculopathy, lumbar region: Secondary | ICD-10-CM | POA: Diagnosis not present

## 2023-12-02 ENCOUNTER — Ambulatory Visit: Payer: No Typology Code available for payment source | Admitting: Gastroenterology

## 2023-12-02 ENCOUNTER — Telehealth: Payer: Self-pay

## 2023-12-02 ENCOUNTER — Encounter: Payer: Self-pay | Admitting: Gastroenterology

## 2023-12-02 VITALS — BP 120/80 | HR 85 | Ht 71.0 in | Wt 276.0 lb

## 2023-12-02 DIAGNOSIS — Z86018 Personal history of other benign neoplasm: Secondary | ICD-10-CM

## 2023-12-02 DIAGNOSIS — K219 Gastro-esophageal reflux disease without esophagitis: Secondary | ICD-10-CM

## 2023-12-02 DIAGNOSIS — I495 Sick sinus syndrome: Secondary | ICD-10-CM

## 2023-12-02 DIAGNOSIS — K449 Diaphragmatic hernia without obstruction or gangrene: Secondary | ICD-10-CM

## 2023-12-02 DIAGNOSIS — I482 Chronic atrial fibrillation, unspecified: Secondary | ICD-10-CM

## 2023-12-02 DIAGNOSIS — Z860101 Personal history of adenomatous and serrated colon polyps: Secondary | ICD-10-CM

## 2023-12-02 DIAGNOSIS — I2699 Other pulmonary embolism without acute cor pulmonale: Secondary | ICD-10-CM

## 2023-12-02 DIAGNOSIS — D132 Benign neoplasm of duodenum: Secondary | ICD-10-CM

## 2023-12-02 MED ORDER — DEXLANSOPRAZOLE 60 MG PO CPDR: 60.0000 mg | DELAYED_RELEASE_CAPSULE | Freq: Every day | ORAL | 11 refills | Status: AC

## 2023-12-02 NOTE — Telephone Encounter (Signed)
 [facility] Pre-operative Risk Assessment     Request for surgical clearance:     Endoscopy Procedure  What type of surgery is being performed?     Endoscopy  When is this surgery scheduled?     12/07/23  What type of clearance is required ?   Pharmacy  Are there any medications that need to be held prior to surgery and how long? Eliquis 2 days  Practice name and name of physician performing surgery?      [facility]  What is your office phone and fax number?      Phone- [phone]  Fax- [phone]  Anesthesia type (None, local, MAC, general) ?       MAC   Please route your response to Wendi Maya, MA

## 2023-12-02 NOTE — Telephone Encounter (Signed)
 Please advise holding Eliquis prior to endoscopy on 4/8.  Thank you!  DW

## 2023-12-02 NOTE — Patient Instructions (Signed)
 We have sent the following medications to your pharmacy for you to pick up at your convenience: Continue Dexilant.  Refills have been sent to your pharmacy.  You have been scheduled for an endoscopy. Please follow written instructions given to you at your visit today.  If you use inhalers (even only as needed), please bring them with you on the day of your procedure.  If you take any of the following medications, they will need to be adjusted prior to your procedure:   DO NOT TAKE 7 DAYS PRIOR TO TEST- Trulicity (dulaglutide) Ozempic, Wegovy (semaglutide) Mounjaro (tirzepatide) Bydureon Bcise (exanatide extended release)  DO NOT TAKE 1 DAY PRIOR TO YOUR TEST Rybelsus (semaglutide) Adlyxin (lixisenatide) Victoza (liraglutide) Byetta (exanatide) _______________________________________________________________________  Bonita Quin will be contacted by our office prior to your procedure for directions on holding your Eliquis.  If you do not hear from our office 1 week prior to your scheduled procedure, please call [phone] to discuss.  Thank you for trusting me with your gastrointestinal care!   Boone Master, PA   _______________________________________________________  If your blood pressure at your visit was 140/90 or greater, please contact your primary care physician to follow up on this.  _______________________________________________________  If you are age 110 or older, your body mass index should be between 23-30. Your Body mass index is 38.49 kg/m. If this is out of the aforementioned range listed, please consider follow up with your Primary Care Provider.  If you are age 58 or younger, your body mass index should be between 19-25. Your Body mass index is 38.49 kg/m. If this is out of the aformentioned range listed, please consider follow up with your Primary Care Provider.   ________________________________________________________  The [facility] providers would like  to encourage you to use [facility] to communicate with providers for non-urgent requests or questions.  Due to long hold times on the telephone, sending your provider a message by [facility] may be a faster and more efficient way to get a response.  Please allow 48 business hours for a response.  Please remember that this is for non-urgent requests.  _______________________________________________________

## 2023-12-02 NOTE — Progress Notes (Signed)
 [name]   Chief Complaint: Medication refill Primary GI MD:  Dr. Lenna Sciara  HPI: 63 year old male history of sinus node dysfunction and paroxysmal atrial arrhythmia s/p dual-chamber pacemaker, hypothyroidism, pulmonary embolism, nonobstructive CAD, on Eliquis (follows with Dr. Graciela Husbands), and others as listed below presents for evaluation of medication refill  Recently seen by cardiology January 2025 for preop clearance in which she was given permission to hold his Eliquis for 3 days  EGD 05/2022 showed 5 adenomatous polyps with a repeat recommended 05/2025 and a duodenal tubular adenoma with a repeat recommended 06/2023  Patient is here for medication refill.  He is on Dexilant 60 Mg once daily for longstanding history of GERD.  He states this adequately controls his symptoms without breakthrough and he is doing well.  Denies change in bowel habits, rectal bleeding, other GI symptoms.  He recently got put on Wegovy for weight loss and has been doing well.  Has no adverse side effects.  Continuing to intentionally lose weight  Wt Readings from Last 3 Encounters:  12/02/23 276 lb (125.2 kg)  10/01/23 281 lb 12.8 oz (127.8 kg)  09/10/23 280 lb 9.6 oz (127.3 kg)      PREVIOUS GI WORKUP   EGD 06/05/2022 for surveillance of duodenal adenomas removed 2019 - Esophagogastric landmarks identified.  - 2 cm hiatal hernia.  - Normal esophagus otherwise  - Normal stomach.  - A single duodenal polypoid lesion. Resected and retrieved.  Colonoscopy 06/05/2022 for personal history of advanced polyp in 2019 - One 4 to 5 mm polyp in the cecum, removed with a cold snare. Resected and retrieved.  - Two 3 mm polyps in the ascending colon, removed with a cold snare. Resected and retrieved.  - One 3 mm polyp in the descending colon, removed with a cold snare. Resected and retrieved.  - Two 2 to 3 mm polyps in the proximal rectum, removed with a cold snare. Resected and retrieved.  - Internal hemorrhoids.   - The examination was otherwise normal.  Diagnosis 1. Surgical [P], 2nd portion of duodenum TUBULAR ADENOMA NEGATIVE FOR HIGH-GRADE DYSPLASIA AND CARCINOMA 2. Surgical [P], colon, rectum, polyp (2) TUBULAR ADENOMA HYPERPLASTIC POLYP NEGATIVE FOR HIGH-GRADE DYSPLASIA AND CARCINOMA 3. Surgical [P], colon, descending, ascending and cecum, polyp (4) TUBULAR ADENOMA, 4 FRAGMENTS NEGATIVE FOR HIGH-GRADE DYSPLASIA AND CARCINOMA  Past Medical History:  Diagnosis Date   Atrial fibrillation [facility])    s/p afib ablation  PVI 5/12 JA   CAD -nonobstructive    S/P nstemi-type II cath 05/2008 - nonobs. dzs.   Cataract    left eye 2019   Colon polyps    Complication of anesthesia    last 2 surgeries pt had some respiratory issues following surgery required ED visit, dx with bronchitis.    DDD (degenerative disc disease)    evaluate by neurosurgery is ongoing   Depression    ptsd   Duodenal adenoma    Erectile dysfunction    GERD (gastroesophageal reflux disease)    Headache(784.0)    Hemorrhoids    Hiatal hernia    HTN (hypertension)    Hyperlipemia    Hyperthyroidism    Graves dz on methimazole and followed by Dr Everardo All   Myocardial infarction [facility])    2009   Obesity    Pancreatitis    Polysubstance abuse (HCC)    cocaine, last used 1999   Presence of permanent cardiac pacemaker    since 2009   PUD (peptic ulcer disease)    gastritis  due to ETOH previously   Pulmonary embolism (HCC) 04/2003   Hx of   Rectal bleeding    Sinus node dysfunction (HCC)    s/p MDT PPM - AAI - V port plugged   Sleep apnea    uses CPAP [facility] 2 YRS   Systolic and diastolic CHF, chronic (HCC)    EF 35% by most recent echo 09/19/10   Tooth absence 2014   recent ly had tooth pulled painful    Past Surgical History:  Procedure Laterality Date   ANTERIOR CERVICAL DECOMP/DISCECTOMY FUSION  12/15/2011   Procedure: ANTERIOR CERVICAL DECOMPRESSION/DISCECTOMY FUSION 1 LEVEL;  Surgeon: Temple Pacini, MD;  Location: [facility] NEURO ORS;  Service: Neurosurgery;  Laterality: N/A;  Cervical Six-Seven Anterior Cervical Diskectomy fusion with Allograft and Plating   ANTERIOR CERVICAL DECOMP/DISCECTOMY FUSION N/A 01/01/2020   Procedure: Anterior Cervical Discectomy Fusion - Cervical Seven- Thoracic One;  Surgeon: Julio Sicks, MD;  Location[facility];  Service: Neurosurgery;  Laterality: N/A;  Anterior Cervical Discectomy Fusion - Cervical Seven- Thoracic One   ATRIAL ABLATION SURGERY  12/30/2010   afib ablation by Dr Johney Frame   ATRIAL FIBRILLATION ABLATION N/A 05/06/2017   Procedure: Atrial Fibrillation Ablation;  Surgeon: Hillis Range, MD;  Location: [facility] INVASIVE CV LAB;  Service: Cardiovascular;  Laterality: N/A;   BACK SURGERY     CARDIAC CATHETERIZATION     CADIAC ABLATION DR Johney Frame 12/2010   CARDIAC CATHETERIZATION N/A 05/08/2016   Procedure: Left Heart Cath and Coronary Angiography;  Surgeon: Peter M Swaziland, MD;  Location: [facility] INVASIVE CV LAB;  Service: Cardiovascular;  Laterality: N/A;   Fingers removed from right hand.  10/01/1982   traumatic work injury   INSERT / REPLACE / REMOVE PACEMAKER     05/2008     KNEE ARTHROSCOPY Left 06/03/2021   LUMBAR SPINE SURGERY  02/28/2009   Dr. Dutch Quint   PACEMAKER INSERTION  05/31/2008   by Dr Graciela Husbands   TEE WITHOUT CARDIOVERSION N/A 05/05/2017   Procedure: TRANSESOPHAGEAL ECHOCARDIOGRAM (TEE);  Surgeon: Laurey Morale, MD;  Location: [facility] ENDOSCOPY;  Service: Cardiovascular;  Laterality: N/A;    Current Outpatient Medications  Medication Sig Dispense Refill   apixaban (ELIQUIS) 5 MG TABS tablet Take 1 tablet (5 mg total) by mouth 2 (two) times daily. 180 tablet 1   atorvastatin (LIPITOR) 40 MG tablet Take 40 mg by mouth daily.     carvedilol (COREG) 25 MG tablet TAKE 1 TABLET BY MOUTH TWICE A DAY WITH FOOD 180 tablet 2   desonide (DESOWEN) 0.05 % cream Apply 1 application topically 2 (two) times daily as needed (for rash).      diclofenac Sodium (VOLTAREN)  1 % GEL Apply 4 g topically 4 (four) times daily.     dofetilide (TIKOSYN) 500 MCG capsule TAKE 1 CAPSULE BY MOUTH 2 TIMES DAILY. 180 capsule 3   fluticasone (FLONASE) 50 MCG/ACT nasal spray Place into both nostrils.     gabapentin (NEURONTIN) 100 MG capsule Take 1-2 capsules (100-200 mg total) by mouth See admin instructions. Patient takes 1 tablet in the morning and 2 tablets at night (Patient taking differently: Take 100-200 mg by mouth See admin instructions. Patient takes 2 tablets at night) 60 capsule 3   loratadine (CLARITIN) 10 MG tablet Take 1 tablet (10 mg total) by mouth daily. 30 tablet PRN   methimazole (TAPAZOLE) 5 MG tablet Take 1 tablet by mouth daily.     olmesartan (BENICAR) 20 MG tablet Take 20  mg by mouth daily.     oxyCODONE-acetaminophen (PERCOCET) 10-325 MG tablet Take 1 tablet by mouth 4 (four) times daily as needed.     triamcinolone ointment (KENALOG) 0.1 % APPLY MODERATE AMOUNT TO AFFECTED AREA DAILY RASH     dexlansoprazole (DEXILANT) 60 MG capsule Take 1 capsule (60 mg total) by mouth daily. Please keep your April 3rd appointment for further refills. Thank you 30 capsule 11   No current facility-administered medications for this visit.    Allergies as of 12/02/2023 - Review Complete 12/02/2023  Allergen Reaction Noted   Celecoxib Itching 12/03/2010   Hydrocodone Itching 12/03/2010   Prednisolone Itching 12/03/2010   Potassium chloride Other (See Comments) 03/09/2017    Family History  Problem Relation Age of Onset   Heart disease Mother    Breast cancer Mother    Lung cancer Father        non-smoker   Diabetes Other    Colon cancer Neg Hx    Esophageal cancer Neg Hx    Rectal cancer Neg Hx    Stomach cancer Neg Hx     Social History   Socioeconomic History   Marital status: Divorced    Spouse name: Not on file   Number of children: 7   Years of education: Not on file   Highest education level: Not on file  Occupational History   Occupation:  unemployed    Employer: UNEMPLOYED  Tobacco Use   Smoking status: Former    Current packs/day: 0.00    Average packs/day: 1 pack/day for 15.0 years (15.0 ttl pk-yrs)    Types: Cigarettes    Start date: 04/03/1988    Quit date: 04/04/2003    Years since quitting: 20.6   Smokeless tobacco: Never  Vaping Use   Vaping status: Never Used  Substance and Sexual Activity   Alcohol use: No    Comment: formerly heavy ETOH,  no alcohol in 20 years   Drug use: No    Comment: previously used crack cocaine, quit 1999,  + marijuana   Sexual activity: Yes  Other Topics Concern   Not on file  Social History Narrative   Unemployed currently.  Previously worked as a Curator.  Lives in [place].   Social Drivers of Corporate investment banker Strain: Not on file  Food Insecurity: No Food Insecurity (09/18/2021)   Received from [facility]   Hunger Vital Sign    Worried About Running Out of Food in the Last Year: Never true    Ran Out of Food in the Last Year: Never true  Transportation Needs: Not on file  Physical Activity: Not on file  Stress: No Stress Concern Present (06/10/2021)   Received from [facility], [facility]   [employer] of Occupational Health - Occupational Stress Questionnaire    Feeling of Stress : Not at all  Social Connections: Unknown (01/01/2022)   Received from [facility], [facility]   Social Network    Social Network: Not on file  Intimate Partner Violence: Not At Risk (08/14/2023)   Received from [facility]   HITS    Over the last 12 months how often did your partner physically hurt you?: Never    Over the last 12 months how often did your partner insult you or talk down to you?: Never    Over the last 12 months how often did your partner threaten you with physical harm?: Never    Over the last 12  months how often did your partner scream or curse at you?: Never    Review of Systems:    Constitutional: No weight loss, fever,  chills, weakness or fatigue HEENT: Eyes: No change in vision               Ears, Nose, Throat:  No change in hearing or congestion Skin: No rash or itching Cardiovascular: No chest pain, chest pressure or palpitations   Respiratory: No SOB or cough Gastrointestinal: See HPI and otherwise negative Genitourinary: No dysuria or change in urinary frequency Neurological: No headache, dizziness or syncope Musculoskeletal: No new muscle or joint pain Hematologic: No bleeding or bruising Psychiatric: No history of depression or anxiety    Physical Exam:  Vital signs: BP 120/80   Pulse 85   Ht 5\' 11"  (1.803 m)   Wt 276 lb (125.2 kg)   BMI 38.49 kg/m   Constitutional: NAD, Well developed, Well nourished, alert and cooperative Head:  Normocephalic and atraumatic. Eyes:   PEERL, EOMI. No icterus. Conjunctiva pink. Respiratory: Respirations even and unlabored. Lungs clear to auscultation bilaterally.   No wheezes, crackles, or rhonchi.  Cardiovascular:  Regular rate and rhythm. No peripheral edema, cyanosis or pallor.  Gastrointestinal:  Soft, nondistended, nontender. No rebound or guarding. Normal bowel sounds. No appreciable masses or hepatomegaly. Rectal:  Not performed.  Msk:  Symmetrical without gross deformities. Without edema, no deformity or joint abnormality.  Neurologic:  Alert and  oriented x4;  grossly normal neurologically.  Skin:   Dry and intact without significant lesions or rashes. Psychiatric: Oriented to person, place and time. Demonstrates good judgement and reason without abnormal affect or behaviors.   RELEVANT LABS AND IMAGING: CBC    Component Value Date/Time   WBC 4.9 08/19/2022 1014   WBC 5.6 01/26/2022 0120   RBC 5.29 08/19/2022 1014   RBC 5.11 01/26/2022 0120   HGB 14.3 08/19/2022 1014   HCT 43.7 08/19/2022 1014   PLT 253 08/19/2022 1014   MCV 83 08/19/2022 1014   MCH 27.0 08/19/2022 1014   MCH 27.4 01/26/2022 0120   MCHC 32.7 08/19/2022 1014   MCHC  33.0 01/26/2022 0120   RDW 15.6 (H) 08/19/2022 1014   LYMPHSABS 2.6 01/26/2022 0120   LYMPHSABS 2.1 04/28/2017 1130   MONOABS 0.5 01/26/2022 0120   EOSABS 0.2 01/26/2022 0120   EOSABS 0.1 04/28/2017 1130   BASOSABS 0.0 01/26/2022 0120   BASOSABS 0.0 04/28/2017 1130    CMP     Component Value Date/Time   NA 141 08/19/2022 1014   K 4.9 08/19/2022 1014   CL 105 08/19/2022 1014   CO2 24 08/19/2022 1014   GLUCOSE 96 08/19/2022 1014   GLUCOSE 101 (H) 01/26/2022 0120   BUN 19 08/19/2022 1014   CREATININE 1.14 08/19/2022 1014   CREATININE 0.96 05/06/2016 0844   CALCIUM 9.3 08/19/2022 1014   PROT 6.5 12/26/2019 0950   ALBUMIN 4.2 12/26/2019 0950   AST 22 12/26/2019 0950   ALT 23 12/26/2019 0950   ALKPHOS 105 12/26/2019 0950   BILITOT 0.5 12/26/2019 0950   GFRNONAA >60 01/26/2022 0120   GFRAA 83 09/19/2020 1144     Assessment/Plan:   GERD Chronic history of GERD well-controlled on Dexilant 60 Mg once daily with EGD in 2023 showing 2 cm hiatal hernia and duodenal adenoma, otherwise unrevealing.  Recent labs 08/2023 including CMP and magnesium were normal - Refill Dexilant 60 Mg once daily - PPI risks discussed - Educated patient on lifestyle modifications  Duodenal adenoma EGD in 2023 for surveillance of previous duodenal adenoma showed single duodenal polypoid lesion with biopsies for detecting tubular adenoma and a recall of 1 year. - Due for repeat EGD, please schedule with - I thoroughly discussed the procedure with the patient (at bedside) to include nature of the procedure, alternatives, benefits, and risks (including but not limited to bleeding, infection, perforation, anesthesia/cardiac pulmonary complications).  Patient verbalized understanding and gave verbal consent to proceed with procedure.   History of colon polyps Colonoscopy in 2023 with 5 adenomatous polyps.  And a recall of 3 years (07/2025) - Recall 07/2025  Nonobstructive CAD Pulmonary embolism Sinus  node dysfunction with atrial arrhythmia s/p dual-chamber pacemaker Afib Follows with Dr. Graciela Husbands and is on Eliquis.  Previous endoscopic procedures have been in [place] without difficulty. - Will hold Eliquis 2 days prior to endoscopic procedures - will instruct when and how to resume after procedure. Benefits and risks of procedure explained including risks of bleeding, perforation, infection, missed lesions, reactions to medications and possible need for hospitalization and surgery for complications. Additional rare but real risk of stroke or other vascular clotting events off Eliquis also explained and need to seek urgent help if any signs of these problems occur. Will communicate by phone or EMR with patient's  prescribing provider to confirm that holding Eliquis is reasonable in this case.     This visit required 35 minutes of patient care (this includes precharting, chart review, review of results, face-to-face time used for counseling as well as treatment plan and follow-up. The patient was provided an opportunity to ask questions and all were answered. The patient agreed with the plan and demonstrated an understanding of the instructions.   Lara Mulch [facility] 12/02/2023, 11:54 AM  Cc: [facility]

## 2023-12-02 NOTE — Telephone Encounter (Signed)
   Name: [name]  DOB: February 22, 1961  MRN: [id number]   Primary Cardiologist: Hillis Range, MD (Inactive)  Chart reviewed as part of pre-operative protocol coverage.   We have been asked for guidance to hold oral anticoagulation for upcoming procedure. Per our clinical pharmacist:  Per office protocol, patient can hold Eliquis for 2 days prior to procedure.    I will route this recommendation to the requesting party via Epic fax function and remove from pre-op pool. Please call with questions.  Roe Rutherford Kaidan Spengler, PA 12/02/2023, 4:24 PM

## 2023-12-02 NOTE — Telephone Encounter (Signed)
 Patient with diagnosis of A fib + PE on Eliquis for anticoagulation.    Procedure: endoscopy Date of procedure: 12/07/23   CHA2DS2-VASc Score = 3  This indicates a 3.2% annual risk of stroke. The patient's score is based upon: CHF History: 1 HTN History: 1 Diabetes History: 0 Stroke History: 0 Vascular Disease History: 1 Age Score: 0 Gender Score: 0   CrCl 115 ml/min Platelet count 233K   Per office protocol, patient can hold Eliquis for 2 days prior to procedure.     **This guidance is not considered finalized until pre-operative APP has relayed final recommendations.**

## 2023-12-02 NOTE — Progress Notes (Signed)
 Agree with assessment and plan as outlined.

## 2023-12-03 ENCOUNTER — Other Ambulatory Visit: Payer: Self-pay | Admitting: Nurse Practitioner

## 2023-12-03 ENCOUNTER — Other Ambulatory Visit: Payer: Self-pay | Admitting: Cardiology

## 2023-12-03 NOTE — Telephone Encounter (Signed)
 I spoke to [place] and I advised him that his doctor cleared him to hold his Eliquis for 2 days prior to his procedure.  I also advised him that after his procedure, the doctor will instruct him on when to restart.

## 2023-12-06 MED ORDER — CARVEDILOL 25 MG PO TABS: 25.0000 mg | ORAL_TABLET | Freq: Two times a day (BID) | ORAL | 2 refills | Status: AC

## 2023-12-06 NOTE — Addendum Note (Signed)
 Addended by: Adriana Simas, Buster Schueller L on: 12/06/2023 08:06 AM   Modules accepted: Orders

## 2023-12-07 ENCOUNTER — Encounter: Payer: Self-pay | Admitting: Gastroenterology

## 2023-12-07 ENCOUNTER — Ambulatory Visit (AMBULATORY_SURGERY_CENTER): Admitting: Gastroenterology

## 2023-12-07 VITALS — BP 113/83 | HR 68 | Temp 98.2°F | Resp 18 | Ht 71.0 in | Wt 276.0 lb

## 2023-12-07 DIAGNOSIS — Z9889 Other specified postprocedural states: Secondary | ICD-10-CM | POA: Diagnosis not present

## 2023-12-07 DIAGNOSIS — G4733 Obstructive sleep apnea (adult) (pediatric): Secondary | ICD-10-CM | POA: Diagnosis not present

## 2023-12-07 DIAGNOSIS — D132 Benign neoplasm of duodenum: Secondary | ICD-10-CM

## 2023-12-07 DIAGNOSIS — I4891 Unspecified atrial fibrillation: Secondary | ICD-10-CM | POA: Diagnosis not present

## 2023-12-07 DIAGNOSIS — K449 Diaphragmatic hernia without obstruction or gangrene: Secondary | ICD-10-CM | POA: Diagnosis not present

## 2023-12-07 MED ORDER — SODIUM CHLORIDE 0.9 % IV SOLN
500.0000 mL | Freq: Once | INTRAVENOUS | Status: DC
Start: 2023-12-07 — End: 2023-12-07

## 2023-12-07 NOTE — Progress Notes (Signed)
 A/o x 3, VSS, gd SR's, pleased with anesthesia, report to RN

## 2023-12-07 NOTE — Patient Instructions (Signed)
 Please read handouts provided. Continue present medications. Await pathology results. Resume Eliquis later today. Resume previous diet.  YOU HAD AN ENDOSCOPIC PROCEDURE TODAY AT THE [facility]:   Refer to the procedure report that was given to you for any specific questions about what was found during the examination.  If the procedure report does not answer your questions, please call your gastroenterologist to clarify.  If you requested that your care partner not be given the details of your procedure findings, then the procedure report has been included in a sealed envelope for you to review at your convenience later.  YOU SHOULD EXPECT: Some feelings of bloating in the abdomen. Passage of more gas than usual.  Walking can help get rid of the air that was put into your GI tract during the procedure and reduce the bloating. If you had a lower endoscopy (such as a colonoscopy or flexible sigmoidoscopy) you may notice spotting of blood in your stool or on the toilet paper. If you underwent a bowel prep for your procedure, you may not have a normal bowel movement for a few days.  Please Note:  You might notice some irritation and congestion in your nose or some drainage.  This is from the oxygen used during your procedure.  There is no need for concern and it should clear up in a day or so.  SYMPTOMS TO REPORT IMMEDIATELY:  Following upper endoscopy (EGD)  Vomiting of blood or coffee ground material  New chest pain or pain under the shoulder blades  Painful or persistently difficult swallowing  New shortness of breath  Fever of 100F or higher  Black, tarry-looking stools  For urgent or emergent issues, a gastroenterologist can be reached at any hour by calling [phone]. Do not use MyChart messaging for urgent concerns.    DIET:  We do recommend a small meal at first, but then you may proceed to your regular diet.  Drink plenty of fluids but you should avoid alcoholic  beverages for 24 hours.  ACTIVITY:  You should plan to take it easy for the rest of today and you should NOT DRIVE or use heavy machinery until tomorrow (because of the sedation medicines used during the test).    FOLLOW UP: Our staff will call the number listed on your records the next business day following your procedure.  We will call around 7:15- 8:00 am to check on you and address any questions or concerns that you may have regarding the information given to you following your procedure. If we do not reach you, we will leave a message.     If any biopsies were taken you will be contacted by phone or by letter within the next 1-3 weeks.  Please call us at [phone] if you have not heard about the biopsies in 3 weeks.    SIGNATURES/CONFIDENTIALITY: You and/or your care partner have signed paperwork which will be entered into your electronic medical record.  These signatures attest to the fact that that the information above on your After Visit Summary has been reviewed and is understood.  Full responsibility of the confidentiality of this discharge information lies with you and/or your care-partner.

## 2023-12-07 NOTE — Progress Notes (Signed)
 History and Physical Interval Note: History of duodenal adenomas removed 2019 and again 05/2022. Also with history of GERD, on Dexilant.. EGD to survey history of duodenal adenoma. On Eliquis, held for 2 days for this exam.  I have discussed risks / benefits, he understands and wishes to proceed with the exam. Otherwise no interval changes since office visit.   12/07/2023 10:27 AM  [name]  has presented today for endoscopic procedure(s), with the diagnosis of  Encounter Diagnosis  Name Primary?   Duodenal adenoma Yes  .  The various methods of evaluation and treatment have been discussed with the patient and/or family. After consideration of risks, benefits and other options for treatment, the patient has consented to  the endoscopic procedure(s).   The patient's history has been reviewed, patient examined, no change in status, stable for surgery.  I have reviewed the patient's chart and labs.  Questions were answered to the patient's satisfaction.    Harlin Rain, MD [facility]

## 2023-12-07 NOTE — Progress Notes (Signed)
 Called to room to assist during endoscopic procedure.  Patient ID and intended procedure confirmed with present staff. Received instructions for my participation in the procedure from the performing physician.

## 2023-12-07 NOTE — Op Note (Signed)
  [facility] Patient Name: [name] Procedure Date: 12/07/2023 10:17 AM MRN: [id number] Endoscopist: Viviann Spare P. Adela Lank , MD, [id number] Age: 63 Referring MD:  Date of Birth: 08/18/1961 Gender: Male Account #: [id number] Procedure:                Upper GI endoscopy Indications:              Follow-up of precancerous lesions of the duodenum -                            duodenal adenomas removed 2019 and 2023 Medicines:                Monitored Anesthesia Care Procedure:                Pre-Anesthesia Assessment:                           - Prior to the procedure, a History and Physical                            was performed, and patient medications and                            allergies were reviewed. The patient's tolerance of                            previous anesthesia was also reviewed. The risks                            and benefits of the procedure and the sedation                            options and risks were discussed with the patient.                            All questions were answered, and informed consent                            was obtained. Prior Anticoagulants: The patient has                            taken Eliquis (apixaban), last dose was 2 days                            prior to procedure. ASA Grade Assessment: III - A                            patient with severe systemic disease. After                            reviewing the risks and benefits, the patient was                            deemed in satisfactory condition to undergo the  procedure.                           After obtaining informed consent, the endoscope was                            passed under direct vision. Throughout the                            procedure, the patient's blood pressure, pulse, and                            oxygen saturations were monitored continuously. The                            Olympus Scope [id number] was  introduced through the                            mouth, and advanced to the third part of duodenum.                            The upper GI endoscopy was accomplished without                            difficulty. The patient tolerated the procedure                            well. Scope In: Scope Out: Findings:                 Esophagogastric landmarks were identified: the                            Z-line was found at 37 cm, the gastroesophageal                            junction was found at 37 cm and the upper extent of                            the gastric folds was found at 39 cm from the                            incisors.                           A 2 cm hiatal hernia was present.                           The exam of the esophagus was otherwise normal.                           The entire examined stomach was normal.                           A post polypectomy scar was found in the third  portion of the duodenum. It appeared okay without                            any obvious recurrent / residual polypoid tissue,                            some peripheral nodular tissue I think granulation                            tissue from scarring. Biopsies of this area were                            taken with a cold forceps for histology to ensure                            no small adenomatous changes.                           The exam of the duodenum was otherwise normal. Complications:            No immediate complications. Estimated blood loss:                            Minimal. Estimated Blood Loss:     Estimated blood loss was minimal. Impression:               - Esophagogastric landmarks identified.                           - 2 cm hiatal hernia.                           - Normal esophagus otherwise.                           - Normal stomach.                           - Duodenal scar in the 3rd portion - no overt                             residual polypoid tissue. Biopsied as outlined.                           - Normal exam otherwise. Recommendation:           - Patient has a contact number available for                            emergencies. The signs and symptoms of potential                            delayed complications were discussed with the                            patient. Return to normal activities tomorrow.  Written discharge instructions were provided to the                            patient.                           - Resume previous diet.                           - Continue present medications.                           - Resume Eliquis later today.                           - Await pathology results. Viviann Spare P. Nyzir Dubois, MD 12/07/2023 10:51:57 AM This report has been signed electronically.

## 2023-12-08 ENCOUNTER — Telehealth: Payer: Self-pay

## 2023-12-08 NOTE — Telephone Encounter (Signed)
 Attempted to reach patient for post-procedure f/u call. No answer. Left message for him to please not hesitate to call if he has any questions/concerns regarding his care.

## 2023-12-09 LAB — SURGICAL PATHOLOGY

## 2023-12-10 ENCOUNTER — Telehealth: Payer: Self-pay | Admitting: Gastroenterology

## 2023-12-10 NOTE — Telephone Encounter (Signed)
 Patient called stated he has been bloated all week. Please advise.

## 2023-12-10 NOTE — Telephone Encounter (Signed)
 Spoke with pt and he states he has had gas and bloating along with stomach discomfort. He feels it may be related to the wegovy. Pt will try some gaviscon to see if this helps.

## 2023-12-22 DIAGNOSIS — I252 Old myocardial infarction: Secondary | ICD-10-CM | POA: Diagnosis not present

## 2023-12-22 DIAGNOSIS — I10 Essential (primary) hypertension: Secondary | ICD-10-CM | POA: Diagnosis not present

## 2023-12-22 DIAGNOSIS — G4730 Sleep apnea, unspecified: Secondary | ICD-10-CM | POA: Diagnosis not present

## 2023-12-22 DIAGNOSIS — I509 Heart failure, unspecified: Secondary | ICD-10-CM | POA: Diagnosis not present

## 2023-12-22 DIAGNOSIS — R7303 Prediabetes: Secondary | ICD-10-CM | POA: Diagnosis not present

## 2023-12-22 DIAGNOSIS — I428 Other cardiomyopathies: Secondary | ICD-10-CM | POA: Diagnosis not present

## 2023-12-22 DIAGNOSIS — I480 Paroxysmal atrial fibrillation: Secondary | ICD-10-CM | POA: Diagnosis not present

## 2023-12-30 DIAGNOSIS — M1712 Unilateral primary osteoarthritis, left knee: Secondary | ICD-10-CM | POA: Diagnosis not present

## 2024-01-20 DIAGNOSIS — M170 Bilateral primary osteoarthritis of knee: Secondary | ICD-10-CM | POA: Diagnosis not present

## 2024-01-21 ENCOUNTER — Other Ambulatory Visit: Payer: Self-pay | Admitting: Neurosurgery

## 2024-01-21 DIAGNOSIS — M5416 Radiculopathy, lumbar region: Secondary | ICD-10-CM

## 2024-01-28 ENCOUNTER — Telehealth: Payer: Self-pay

## 2024-01-28 NOTE — Telephone Encounter (Signed)
 Pharmacy please advise on holding Eliquis  prior to lumbar epidural scheduled for TBD. Thank you.

## 2024-01-28 NOTE — Telephone Encounter (Signed)
   Pre-operative Risk Assessment    Patient Name: [name]  DOB: 1961-08-07 MRN: [id number]   Date of last office visit: 09/10/23 STEVEN KLEIN, MD Date of next office visit: NONE   Request for Surgical Clearance    Procedure:  LU7MBAR EPIDURAL  Date of Surgery:  Clearance TBD                                Surgeon:  NOT INDICATED  Surgeon's Group or Practice Name:  [facility] Phone number:  [phone] Fax number:  [phone]   Type of Clearance Requested:   - Medical  - Pharmacy:  Hold Apixaban  (Eliquis ) 4 DOSES   Type of Anesthesia:  Not Indicated   Additional requests/questions:    Signed, Collin Deal   01/28/2024, 1:23 PM

## 2024-01-31 NOTE — Telephone Encounter (Signed)
   Name: [name]  DOB: Feb 22, 1961  MRN: [id number]  Primary Cardiologist: Jolly Needle, MD (Inactive)   Preoperative team, please contact this patient and set up a phone call appointment for further preoperative risk assessment. Please obtain consent and complete medication review. Thank you for your help.  I confirm that guidance regarding antiplatelet and oral anticoagulation therapy has been completed and, if necessary, noted below.  Per office protocol, patient can hold Eliquis  for 3 days prior to procedure.   Patient will not need bridging with Lovenox (enoxaparin) around procedure.   PLEASE NOTE THAT WE RECOMMEND 3 DAY HOLD FOR ALL SPINAL PROCEDURES.  I also confirmed the patient resides in the state of [place] . As per [facility] Board telemedicine laws, the patient must reside in the state in which the provider is licensed.   Acey Woodfield D Wendell Nicoson, NP 01/31/2024, 3:59 PM [facility]

## 2024-01-31 NOTE — Telephone Encounter (Signed)
 I will send a message to EP schedulers to reach out to the pt with an appt. Looks like follows EP.

## 2024-01-31 NOTE — Telephone Encounter (Signed)
 Patient with diagnosis of atrial fibrillation on Eliquis  for anticoagulation.    Procedure:  LU7MBAR EPIDURAL   Date of Surgery:  Clearance TBD    CHA2DS2-VASc Score = 3   This indicates a 3.2% annual risk of stroke. The patient's score is based upon: CHF History: 1 HTN History: 1 Diabetes History: 0 Stroke History: 0 Vascular Disease History: 1 Age Score: 0 Gender Score: 0    CrCl 115 Platelet count 233  Patient has not had an Afib/aflutter ablation within the last 3 months or DCCV within the last 30 days  Per office protocol, patient can hold Eliquis  for 3 days prior to procedure.   Patient will not need bridging with Lovenox (enoxaparin) around procedure.  PLEASE NOTE THAT WE RECOMMEND 3 DAY HOLD FOR ALL SPINAL PROCEDURES.  **This guidance is not considered finalized until pre-operative APP has relayed final recommendations.**

## 2024-02-01 NOTE — Telephone Encounter (Signed)
 Spoke w/ patient - he is scheduled with Dr. Lawana Pray on 6/18 for pre-op  clearance and to establish care since former Dr. Rodolfo Clan patient.

## 2024-02-01 NOTE — Telephone Encounter (Signed)
 Per EP: he does need to see EP though for a 6 mo f/u from [facility] appt with him. can we keep 6/18 for him to establish care and then y'all can make another appt for the pre-op  clearance?     Ok thank you ladies; if you guys want to call him that would be awesome. I will let preop APP know he does need EP appt in office, ty ladies.

## 2024-02-01 NOTE — Telephone Encounter (Signed)
 My apologies I misread the appt type. Pt can have tele preop APP, the CMA from preop will call the pt to schedule tele preop appt.

## 2024-02-02 ENCOUNTER — Ambulatory Visit (INDEPENDENT_AMBULATORY_CARE_PROVIDER_SITE_OTHER): Payer: Medicaid Other

## 2024-02-02 DIAGNOSIS — I495 Sick sinus syndrome: Secondary | ICD-10-CM | POA: Diagnosis not present

## 2024-02-02 DIAGNOSIS — M5416 Radiculopathy, lumbar region: Secondary | ICD-10-CM | POA: Diagnosis not present

## 2024-02-04 LAB — CUP PACEART REMOTE DEVICE CHECK
Battery Impedance: 4521 Ohm
Battery Remaining Longevity: 21 mo
Battery Voltage: 2.68 V
Brady Statistic RA Percent Paced: 2 %
Date Time Interrogation Session: 20250605151117
Implantable Lead Connection Status: 753985
Implantable Lead Implant Date: 20091023
Implantable Lead Location: 753859
Implantable Lead Model: 5076
Implantable Pulse Generator Implant Date: 20091023
Lead Channel Impedance Value: 452 Ohm
Lead Channel Impedance Value: 67 Ohm
Lead Channel Setting Pacing Amplitude: 2 V
Zone Setting Status: 755011

## 2024-02-08 DIAGNOSIS — G894 Chronic pain syndrome: Secondary | ICD-10-CM | POA: Diagnosis not present

## 2024-02-08 DIAGNOSIS — M5416 Radiculopathy, lumbar region: Secondary | ICD-10-CM | POA: Diagnosis not present

## 2024-02-09 NOTE — Progress Notes (Signed)
  Electrophysiology Office Note:   ID:  [name], DOB 06-11-1961, MRN [id number]  Primary Cardiologist: None Electrophysiologist: Will Cortland Ding, MD      History of Present Illness:   [name] is a 63 y.o. male with h/o SSS s/p PPM, hyperthyroidism, NICM with recovered EF, and PAF seen today for cardiac clearance for lumbar epidural   Since last being seen in our clinic the patient reports doing well. Has back pain and is pending epidural. No breakthrough AF. Otherwise, he denies chest pain, palpitations, dyspnea, PND, orthopnea, nausea, vomiting, dizziness, syncope, edema, weight gain, or early satiety.  Review of systems complete and found to be negative unless listed in HPI.   EP Information / Studies Reviewed:    EKG is ordered today. Personal review as below.  EKG Interpretation Date/Time:  Thursday February 10 2024 10:19:11 EDT Ventricular Rate:  50 PR Interval:  188 QRS Duration:  116 QT Interval:  484 QTC Calculation: 441 R Axis:   -27  Text Interpretation: Atrial-paced rhythm Incomplete left bundle branch block ST & T wave abnormality, consider anterolateral ischemia When compared with ECG of 10-Sep-2023 09:40, Electronic atrial pacemaker has replaced Sinus rhythm T wave inversion more evident in Inferior leads T wave inversion now evident in Anterolateral leads Confirmed by Pilar Bridge [phone]) on 02/10/2024 10:51:15 AM    PPM Interrogation-  reviewed in detail today,  See PACEART report.  Arrhythmia/Device History MDT dual chamber PPM implanted 2009, A lead only, RV port is plugged  S/p PVI 2012 S/p PVI/CTI 2018   Physical Exam:   VS:  BP (!) 144/85   Pulse (!) 54   Ht 5' 11 (1.803 m)   Wt 277 lb (125.6 kg)   SpO2 94%   BMI 38.63 kg/m    Wt Readings from Last 3 Encounters:  02/10/24 277 lb (125.6 kg)  12/07/23 276 lb (125.2 kg)  12/02/23 276 lb (125.2 kg)     GEN: No acute distress  NECK: No JVD; No carotid bruits CARDIAC:  Regular rate and rhythm, no murmurs, rubs, gallops RESPIRATORY:  Clear to auscultation without rales, wheezing or rhonchi  ABDOMEN: Soft, non-tender, non-distended EXTREMITIES:  No edema; No deformity   ASSESSMENT AND PLAN:    Tachy-Brady syndrome s/p Medtronic PPM  Normal PPM function See Pace Art report No changes today  Paroxysmal AF EKG today shows NSR with stable intervals Continue tikosyn  500 mcg BID Continue eliquis  5 mg BID CHA2DS2VASc of at least 3 Labs today  HTN Stable on current regimen   NICM LVEF normalized by echo 10/2020 NYHA II-III symptoms  Cardiac Clearance for Lumbar Epidural No concerns from a cardiac perspective.  The patient is at low risk to proceed without further work up.  OK to hold eliquis  for 3 days. If the patient has new chest pain or SOB prior to surgery, they should be revaluated.   Disposition:   Follow up with EP Team in 6 months  Signed, Tylene Galla, PA-C

## 2024-02-10 ENCOUNTER — Encounter: Payer: Self-pay | Admitting: Student

## 2024-02-10 ENCOUNTER — Ambulatory Visit: Attending: Student | Admitting: Student

## 2024-02-10 VITALS — BP 144/85 | HR 54 | Ht 71.0 in | Wt 277.0 lb

## 2024-02-10 DIAGNOSIS — Z950 Presence of cardiac pacemaker: Secondary | ICD-10-CM | POA: Insufficient documentation

## 2024-02-10 DIAGNOSIS — I495 Sick sinus syndrome: Secondary | ICD-10-CM | POA: Diagnosis not present

## 2024-02-10 DIAGNOSIS — I480 Paroxysmal atrial fibrillation: Secondary | ICD-10-CM | POA: Insufficient documentation

## 2024-02-10 DIAGNOSIS — I428 Other cardiomyopathies: Secondary | ICD-10-CM | POA: Diagnosis not present

## 2024-02-10 LAB — CUP PACEART INCLINIC DEVICE CHECK
Battery Impedance: 4704 Ohm
Battery Remaining Longevity: 19 mo
Battery Voltage: 2.66 V
Brady Statistic RA Percent Paced: 2 %
Date Time Interrogation Session: 20250612115740
Implantable Lead Connection Status: 753985
Implantable Lead Implant Date: 20091023
Implantable Lead Location: 753859
Implantable Lead Model: 5076
Implantable Pulse Generator Implant Date: 20091023
Lead Channel Impedance Value: 455 Ohm
Lead Channel Impedance Value: 67 Ohm
Lead Channel Pacing Threshold Amplitude: 0.75 V
Lead Channel Pacing Threshold Pulse Width: 0.4 ms
Lead Channel Sensing Intrinsic Amplitude: 1.4 mV
Lead Channel Setting Pacing Amplitude: 2 V
Zone Setting Status: 755011

## 2024-02-10 NOTE — Patient Instructions (Signed)
 Medication Instructions:  No medication changes today. *If you need a refill on your cardiac medications before your next appointment, please call your pharmacy*  Lab Work: BMET and Mg today If you have labs (blood work) drawn today and your tests are completely normal, you will receive your results only by: MyChart Message (if you have MyChart) OR A paper copy in the mail If you have any lab test that is abnormal or we need to change your treatment, we will call you to review the results.  Testing/Procedures: No testing ordered today  Follow-Up: At [facility], you and your health needs are our priority.  As part of our continuing mission to provide you with exceptional heart care, our providers are all part of one team.  This team includes your primary Cardiologist (physician) and Advanced Practice Providers or APPs (Physician Assistants and Nurse Practitioners) who all work together to provide you with the care you need, when you need it.  Your next appointment:   6 month(s)  Provider:   You may see Will Cortland Ding, MD or one of the following Advanced Practice Providers on your designated Care Team:   Mertha Abrahams, [place] [address] [place], PA-C Suzann Riddle, NP Creighton Doffing, NP    We recommend signing up for the patient portal called "MyChart".  Sign up information is provided on this After Visit Summary.  MyChart is used to connect with patients for Virtual Visits (Telemedicine).  Patients are able to view lab/test results, encounter notes, upcoming appointments, etc.  Non-urgent messages can be sent to your provider as well.   To learn more about what you can do with MyChart, go to [email or web address].

## 2024-02-11 ENCOUNTER — Ambulatory Visit: Payer: Self-pay | Admitting: Student

## 2024-02-11 ENCOUNTER — Ambulatory Visit: Payer: Self-pay | Admitting: Cardiology

## 2024-02-11 LAB — BASIC METABOLIC PANEL WITH GFR
BUN/Creatinine Ratio: 17 (ref 10–24)
BUN: 17 mg/dL (ref 8–27)
CO2: 21 mmol/L (ref 20–29)
Calcium: 9.4 mg/dL (ref 8.6–10.2)
Chloride: 103 mmol/L (ref 96–106)
Creatinine, Ser: 1.01 mg/dL (ref 0.76–1.27)
Glucose: 85 mg/dL (ref 70–99)
Potassium: 4.7 mmol/L (ref 3.5–5.2)
Sodium: 143 mmol/L (ref 134–144)
eGFR: 83 mL/min/{1.73_m2} (ref >59)

## 2024-02-11 LAB — MAGNESIUM: Magnesium: 2.1 mg/dL (ref 1.6–2.3)

## 2024-02-15 DIAGNOSIS — I872 Venous insufficiency (chronic) (peripheral): Secondary | ICD-10-CM | POA: Diagnosis not present

## 2024-02-16 ENCOUNTER — Ambulatory Visit: Admitting: Cardiology

## 2024-02-17 DIAGNOSIS — G4733 Obstructive sleep apnea (adult) (pediatric): Secondary | ICD-10-CM | POA: Diagnosis not present

## 2024-02-22 NOTE — Discharge Instructions (Signed)
Post Procedure Spinal Discharge Instruction Sheet  You may resume a regular diet and any medications that you routinely take (including pain medications) unless otherwise noted by MD.  No driving day of procedure.  Light activity throughout the rest of the day.  Do not do any strenuous work, exercise, bending or lifting.  The day following the procedure, you can resume normal physical activity but you should refrain from exercising or physical therapy for at least three days thereafter.  You may apply ice to the injection site, 20 minutes on, 20 minutes off, as needed. Do not apply ice directly to skin.    Common Side Effects:  Headaches- take your usual medications as directed by your physician.  Increase your fluid intake.  Caffeinated beverages may be helpful.  Lie flat in bed until your headache resolves.  Restlessness or inability to sleep- you may have trouble sleeping for the next few days.  Ask your referring physician if you need any medication for sleep.  Facial flushing or redness- should subside within a few days.  Increased pain- a temporary increase in pain a day or two following your procedure is not unusual.  Take your pain medication as prescribed by your referring physician.  Leg cramps  Please contact our office at [phone] for the following symptoms: Fever greater than 100 degrees. Headaches unresolved with medication after 2-3 days. Increased swelling, pain, or redness at injection site.   Thank you for visiting [facility] today.   YOU MAY RESUME YOUR ELIQUIS IN 24 HOURS.

## 2024-02-23 ENCOUNTER — Ambulatory Visit
Admission: RE | Admit: 2024-02-23 | Discharge: 2024-02-23 | Discharge status: Home / Self Care | Source: Ambulatory Visit | Attending: Neurosurgery | Admitting: Neurosurgery

## 2024-02-23 DIAGNOSIS — M5416 Radiculopathy, lumbar region: Secondary | ICD-10-CM

## 2024-02-23 DIAGNOSIS — M48061 Spinal stenosis, lumbar region without neurogenic claudication: Secondary | ICD-10-CM | POA: Diagnosis not present

## 2024-02-23 MED ORDER — IOPAMIDOL (ISOVUE-M 200) INJECTION 41%
1.0000 mL | Freq: Once | INTRAMUSCULAR | Status: AC
  Administered 2024-02-23: 1 mL via EPIDURAL

## 2024-02-23 MED ORDER — METHYLPREDNISOLONE ACETATE 40 MG/ML INJ SUSP (RADIOLOG
80.0000 mg | Freq: Once | INTRAMUSCULAR | Status: AC
  Administered 2024-02-23: 80 mg via EPIDURAL

## 2024-02-24 DIAGNOSIS — G4733 Obstructive sleep apnea (adult) (pediatric): Secondary | ICD-10-CM | POA: Diagnosis not present

## 2024-03-19 ENCOUNTER — Other Ambulatory Visit: Payer: Self-pay | Admitting: Internal Medicine

## 2024-03-19 DIAGNOSIS — I480 Paroxysmal atrial fibrillation: Secondary | ICD-10-CM

## 2024-03-20 NOTE — Telephone Encounter (Signed)
 Prescription refill request for Eliquis  received. Indication:afib Last office visit:6/25 Scr:1.01  6/25 Age: 63 Weight:125.6  kg  Prescription refilled

## 2024-03-21 DIAGNOSIS — Z6838 Body mass index (BMI) 38.0-38.9, adult: Secondary | ICD-10-CM | POA: Diagnosis not present

## 2024-03-21 DIAGNOSIS — M5416 Radiculopathy, lumbar region: Secondary | ICD-10-CM | POA: Diagnosis not present

## 2024-03-22 DIAGNOSIS — I252 Old myocardial infarction: Secondary | ICD-10-CM | POA: Diagnosis not present

## 2024-03-22 DIAGNOSIS — G4730 Sleep apnea, unspecified: Secondary | ICD-10-CM | POA: Diagnosis not present

## 2024-03-22 DIAGNOSIS — Z79899 Other long term (current) drug therapy: Secondary | ICD-10-CM | POA: Diagnosis not present

## 2024-03-22 DIAGNOSIS — I10 Essential (primary) hypertension: Secondary | ICD-10-CM | POA: Diagnosis not present

## 2024-03-22 DIAGNOSIS — R7303 Prediabetes: Secondary | ICD-10-CM | POA: Diagnosis not present

## 2024-03-22 DIAGNOSIS — I428 Other cardiomyopathies: Secondary | ICD-10-CM | POA: Diagnosis not present

## 2024-03-22 DIAGNOSIS — R0602 Shortness of breath: Secondary | ICD-10-CM | POA: Diagnosis not present

## 2024-03-22 DIAGNOSIS — I509 Heart failure, unspecified: Secondary | ICD-10-CM | POA: Diagnosis not present

## 2024-03-22 DIAGNOSIS — I480 Paroxysmal atrial fibrillation: Secondary | ICD-10-CM | POA: Diagnosis not present

## 2024-03-27 NOTE — Addendum Note (Signed)
 Addended by: VICCI SELLER A on: 03/27/2024 02:20 PM   Modules accepted: Orders

## 2024-03-27 NOTE — Progress Notes (Signed)
 Remote pacemaker transmission.

## 2024-03-31 ENCOUNTER — Telehealth: Payer: Self-pay

## 2024-03-31 ENCOUNTER — Other Ambulatory Visit: Payer: Self-pay | Admitting: Neurosurgery

## 2024-03-31 NOTE — Telephone Encounter (Signed)
..     Pre-operative Risk Assessment    Patient Name: [name]  DOB: 1960-11-20 MRN: [id number]   Date of last office visit: 02/10/24 Date of next office visit: NONE   Request for Surgical Clearance    Procedure:  L5-S1 LUMBAR LAMINECTOMY  Date of Surgery:  Clearance TBD                                Surgeon:  VICTORY GUNNELS Surgeon's Group or Practice Name:  [facility] Phone number:  [phone] -Fax number:  [phone]   Type of Clearance Requested:   - Medical  - Pharmacy:  Hold Apixaban  (Eliquis )     Type of Anesthesia:  General    Additional requests/questions:    Bonney Teressa Rumalda Ronal   03/31/2024, 3:32 PM

## 2024-03-31 NOTE — Procedures (Signed)
Mask fit

## 2024-04-03 ENCOUNTER — Encounter (HOSPITAL_COMMUNITY): Payer: Self-pay | Admitting: Neurosurgery

## 2024-04-03 ENCOUNTER — Other Ambulatory Visit: Payer: Self-pay

## 2024-04-03 ENCOUNTER — Encounter: Payer: Self-pay | Admitting: Cardiology

## 2024-04-03 NOTE — Telephone Encounter (Signed)
     Primary Cardiologist: None  Chart reviewed as part of pre-operative protocol coverage. Given past medical history and time since last visit, based on ACC/AHA guidelines, [name] would be at acceptable risk for the planned procedure without further cardiovascular testing.   Patient has not had an Afib/aflutter ablation within the last 3 months or DCCV within the last 30 days   Per office protocol, patient can hold Eliquis  for 3 days prior to procedure.    I will route this recommendation to the requesting party via Epic fax function and remove from pre-op  pool.  Please call with questions.  Josefa HERO. Tavita Eastham NP-C     04/03/2024, 12:58 PM [facility] [address] Office [phone] Fax [phone]

## 2024-04-03 NOTE — Anesthesia Preprocedure Evaluation (Signed)
 Anesthesia Evaluation  Patient identified by MRN, date of birth, ID band Patient awake    Reviewed: Allergy  & Precautions, NPO status , Patient's Chart, lab work & pertinent test results  Airway Mallampati: II  TM Distance: >3 FB Neck ROM: Full    Dental  (+) Missing   Pulmonary sleep apnea and Continuous Positive Airway Pressure Ventilation , former smoker, PE   Pulmonary exam normal        Cardiovascular hypertension, Pt. on medications + CAD, + Past MI and +CHF  Normal cardiovascular exam+ dysrhythmias + pacemaker      Neuro/Psych  Headaches PSYCHIATRIC DISORDERS  Depression       GI/Hepatic Neg liver ROS, hiatal hernia, PUD,GERD  ,,  Endo/Other    Class 3 obesityPatient on GLP-1 Agonist  Renal/GU negative Renal ROS     Musculoskeletal negative musculoskeletal ROS (+)    Abdominal  (+) + obese  Peds  Hematology  (+) Blood dyscrasia (Eliquis )   Anesthesia Other Findings Radiculopathy lumbar region  Reproductive/Obstetrics                              Anesthesia Physical Anesthesia Plan  ASA: 3  Anesthesia Plan: General   Post-op Pain Management:    Induction: Intravenous  PONV Risk Score and Plan: 2 and Ondansetron , Dexamethasone , Midazolam  and Treatment may vary due to age or medical condition  Airway Management Planned: Oral ETT  Additional Equipment:   Intra-op Plan:   Post-operative Plan: Extubation in OR  Informed Consent: I have reviewed the patients History and Physical, chart, labs and discussed the procedure including the risks, benefits and alternatives for the proposed anesthesia with the patient or authorized representative who has indicated his/her understanding and acceptance.     Dental advisory given  Plan Discussed with: CRNA  Anesthesia Plan Comments: (PAT note by Lynwood Hope, PA-C:  Follows with cardiology for hx of CAD s/p NSTEMI possibly type II  (cath showing nonobstructive disease 2017), NICM with recovered EF, afib s/p multiple ablations on Eliquis  and Tikosyn , sinus node dysfunction s/p Medtronic PPM, PE.  Cardiac clearance per telephone encounter 04/03/2024, Chart reviewed as part of pre-operative protocol coverage. Given past medical history and time since last visit, based on ACC/AHA guidelines, DONOVYN GUIDICE would be at acceptable risk for the planned procedure without further cardiovascular testing. Patient has not had an Afib/aflutter ablation within the last 3 months or DCCV within the last 30 days. Per office protocol, patient can hold Eliquis  for 3 days prior to procedure.  Patient reports last dose of Eliquis  on 03/30/2024.  Reports last dose of [facility] 03/27/2024.  Other pertinent history includes GERD on Dexilant , OSA on CPAP, chronic upper airway cough, hyperthyroid on methimazole , s/p C6-T1 fusion.  Patient will need day of surgery labs and evaluation.  EKG 02/10/2024: Atrial-paced rhythm.  Rate 50. Incomplete left bundle branch block. ST & T wave abnormality, consider anterolateral ischemia  Perioperative prescription for implanted cardiac device programming per progress note 04/03/2024: Device Information:  Clinic EP Physician:  Soyla Norton, MD   Device Type:  Pacemaker Manufacturer and Phone #:  Medtronic: [phone] Pacemaker Dependent?:  No. Date of Last Device Check:  02/10/2024      Normal Device Function?:  Yes.    Electrophysiologist's Recommendations:   Have magnet available.  Provide continuous ECG monitoring when magnet is used or reprogramming is to be performed.   Procedure will likely interfere with device  function.  Device should be programmed:  Asynchronous pacing during procedure and returned to normal programming after procedure   TTE 11/20/2020: 1. Left ventricular ejection fraction, by estimation, is 60 to 65%. The  left ventricle has normal function. The left ventricle has no  regional  wall motion abnormalities. The left ventricular internal cavity size was  mildly dilated. Left ventricular  diastolic parameters are consistent with Grade I diastolic dysfunction  (impaired relaxation).  2. Right ventricular systolic function is normal. The right ventricular  size is normal.  3. The mitral valve is grossly normal. Trivial mitral valve  regurgitation.  4. The aortic valve is tricuspid. Aortic valve regurgitation is not  visualized.  5. Aortic dilatation noted. There is mild dilatation of the ascending  aorta, measuring 39 mm.  6. The inferior vena cava is normal in size with <50% respiratory  variability, suggesting right atrial pressure of 8 mmHg.   Comparison(s): Changes from prior study are noted. 04/04/2016: LVEF 40-45%.   Cath 05/08/16: 1. Nonobstructive CAD 2. Mild to moderate LV dysfunction. EF 45% 3. Normal LV filling pressures.   )         Anesthesia Quick Evaluation

## 2024-04-03 NOTE — Telephone Encounter (Signed)
 Patient with diagnosis of afib on Eliquis  for anticoagulation.    Procedure:  L5-S1 LUMBAR LAMINECTOMY  Date of procedure: TBD 04/04/24?   CHA2DS2-VASc Score = 3   This indicates a 3.2% annual risk of stroke. The patient's score is based upon: CHF History: 1 HTN History: 1 Diabetes History: 0 Stroke History: 0 Vascular Disease History: 1 Age Score: 0 Gender Score: 0      CrCl 99 ml/min Platelet count 233  Patient has not had an Afib/aflutter ablation within the last 3 months or DCCV within the last 30 days  Per office protocol, patient can hold Eliquis  for 3 days prior to procedure.    Looks like pt was cleared by Jodie in June to hold. Procedure is 04/04/24 (tomorrow)  **This guidance is not considered finalized until pre-operative APP has relayed final recommendations.**

## 2024-04-03 NOTE — Progress Notes (Signed)
 SDW CALL  Patient was given pre-op  instructions over the phone. Patient verbalized understanding of instructions provided.   PCP - [facility], Dr. Austin Cardiologist - Dr. Inocencio  PPM/ICD - Medtronic Pacemaker Device Orders - Requested from Device Clinic Rep Notified - notified via secure email  Chest x-ray -  EKG - 02/10/24 Stress Test - 02/06/2010 ECHO - 11/20/20 Cardiac Cath - 05/08/2016  Sleep Study - 02/10/23 CPAP - nightly  Blood Thinner Instructions: Hold Eliquis  3 days, LD 03/30/24 Aspirin  Instructions:  GLP-1: Tzhncb, LD Mon 7/28. Instructed no more until after surgery.  ERAS Protcol - clears until 1200 PRE-SURGERY Ensure or G2-   COVID TEST- n/a  Anesthesia review: yes, cardiac history. Sent for review.  Patient denies shortness of breath, fever, cough and chest pain over the phone call

## 2024-04-03 NOTE — Progress Notes (Signed)
 Anesthesia Chart Review: Same day workup  Follows with cardiology for hx of CAD s/p NSTEMI possibly type II (cath showing nonobstructive disease 2017), NICM with recovered EF, afib s/p multiple ablations on Eliquis  and Tikosyn , sinus node dysfunction s/p Medtronic PPM, PE.  Cardiac clearance per telephone encounter 04/03/2024, Chart reviewed as part of pre-operative protocol coverage. Given past medical history and time since last visit, based on ACC/AHA guidelines, ULIS KAPS would be at acceptable risk for the planned procedure without further cardiovascular testing. Patient has not had an Afib/aflutter ablation within the last 3 months or DCCV within the last 30 days. Per office protocol, patient can hold Eliquis  for 3 days prior to procedure.  Patient reports last dose of Eliquis  on 03/30/2024.  Reports last dose of [facility] 03/27/2024.  Other pertinent history includes GERD on Dexilant , OSA on CPAP, chronic upper airway cough, hyperthyroid on methimazole , s/p C6-T1 fusion.  Patient will need day of surgery labs and evaluation.  EKG 02/10/2024: Atrial-paced rhythm.  Rate 50. Incomplete left bundle branch block. ST & T wave abnormality, consider anterolateral ischemia  Perioperative prescription for implanted cardiac device programming per progress note 04/03/2024: Device Information:   Clinic EP Physician:  Soyla Norton, MD    Device Type:  Pacemaker Manufacturer and Phone #:  Medtronic: [phone] Pacemaker Dependent?:  No. Date of Last Device Check:  02/10/2024      Normal Device Function?:  Yes.     Electrophysiologist's Recommendations:   Have magnet available. Provide continuous ECG monitoring when magnet is used or reprogramming is to be performed.  Procedure will likely interfere with device function.  Device should be programmed:  Asynchronous pacing during procedure and returned to normal programming after procedure   TTE 11/20/2020: 1. Left ventricular ejection  fraction, by estimation, is 60 to 65%. The  left ventricle has normal function. The left ventricle has no regional  wall motion abnormalities. The left ventricular internal cavity size was  mildly dilated. Left ventricular  diastolic parameters are consistent with Grade I diastolic dysfunction  (impaired relaxation).   2. Right ventricular systolic function is normal. The right ventricular  size is normal.   3. The mitral valve is grossly normal. Trivial mitral valve  regurgitation.   4. The aortic valve is tricuspid. Aortic valve regurgitation is not  visualized.   5. Aortic dilatation noted. There is mild dilatation of the ascending  aorta, measuring 39 mm.   6. The inferior vena cava is normal in size with <50% respiratory  variability, suggesting right atrial pressure of 8 mmHg.   Comparison(s): Changes from prior study are noted. 04/04/2016: LVEF 40-45%.    Cath 05/08/16: 1. Nonobstructive CAD 2. Mild to moderate LV dysfunction. EF 45% 3. Normal LV filling pressures.    Lynwood Geofm RIGGERS [facility]/Anesthesiology Phone [phone] 04/03/2024 2:51 PM

## 2024-04-03 NOTE — Progress Notes (Signed)
 PERIOPERATIVE PRESCRIPTION FOR IMPLANTED CARDIAC DEVICE PROGRAMMING  Patient Information: Name:  [name]  DOB:  01/25/1961  MRN:  [id number]    Planned Procedure:  LUMBAR LAMINECTOMY/DECOMPRESSION MICRODISCECTOMY 1 LEVEL - Left  Surgeon:  Dr. Victory Gunnels  Date of Procedure:  04/04/2024  Cautery will be used.  Position during surgery:  PRONE   Please send documentation back to:  [facility] (Fax # [phone])   Device Information:  Clinic EP Physician:  Soyla Norton, MD   Device Type:  Pacemaker Manufacturer and Phone #:  Medtronic: [phone] Pacemaker Dependent?:  No. Date of Last Device Check:  02/10/2024  Normal Device Function?:  Yes.    Electrophysiologist's Recommendations:  Have magnet available. Provide continuous ECG monitoring when magnet is used or reprogramming is to be performed.  Procedure will likely interfere with device function.  Device should be programmed:  Asynchronous pacing during procedure and returned to normal programming after procedure  Per Device Clinic Standing Orders, Almarie ONEIDA Shutter, RN  9:27 AM 04/03/2024

## 2024-04-04 ENCOUNTER — Ambulatory Visit (HOSPITAL_COMMUNITY): Payer: Self-pay | Admitting: Physician Assistant

## 2024-04-04 ENCOUNTER — Observation Stay (HOSPITAL_COMMUNITY)
Admission: RE | Admit: 2024-04-04 | Discharge: 2024-04-05 | Disposition: A | Discharge status: Home / Self Care | Attending: Neurosurgery | Admitting: Neurosurgery

## 2024-04-04 ENCOUNTER — Encounter (HOSPITAL_COMMUNITY): Payer: Self-pay | Admitting: Neurosurgery

## 2024-04-04 ENCOUNTER — Ambulatory Visit (HOSPITAL_COMMUNITY)

## 2024-04-04 ENCOUNTER — Other Ambulatory Visit: Payer: Self-pay

## 2024-04-04 ENCOUNTER — Ambulatory Visit (HOSPITAL_COMMUNITY)
Admission: RE | Disposition: A | Discharge status: Home / Self Care | Payer: Self-pay | Source: Home / Self Care | Attending: Neurosurgery

## 2024-04-04 DIAGNOSIS — Z981 Arthrodesis status: Secondary | ICD-10-CM | POA: Diagnosis not present

## 2024-04-04 DIAGNOSIS — M4727 Other spondylosis with radiculopathy, lumbosacral region: Principal | ICD-10-CM | POA: Insufficient documentation

## 2024-04-04 DIAGNOSIS — I2510 Atherosclerotic heart disease of native coronary artery without angina pectoris: Secondary | ICD-10-CM | POA: Diagnosis not present

## 2024-04-04 DIAGNOSIS — M79605 Pain in left leg: Secondary | ICD-10-CM | POA: Diagnosis present

## 2024-04-04 DIAGNOSIS — I5040 Unspecified combined systolic (congestive) and diastolic (congestive) heart failure: Secondary | ICD-10-CM | POA: Insufficient documentation

## 2024-04-04 DIAGNOSIS — Z7901 Long term (current) use of anticoagulants: Secondary | ICD-10-CM | POA: Insufficient documentation

## 2024-04-04 DIAGNOSIS — M5416 Radiculopathy, lumbar region: Secondary | ICD-10-CM

## 2024-04-04 DIAGNOSIS — I110 Hypertensive heart disease with heart failure: Secondary | ICD-10-CM | POA: Diagnosis not present

## 2024-04-04 DIAGNOSIS — M5117 Intervertebral disc disorders with radiculopathy, lumbosacral region: Secondary | ICD-10-CM | POA: Diagnosis not present

## 2024-04-04 DIAGNOSIS — I5033 Acute on chronic diastolic (congestive) heart failure: Secondary | ICD-10-CM | POA: Diagnosis not present

## 2024-04-04 DIAGNOSIS — M4807 Spinal stenosis, lumbosacral region: Secondary | ICD-10-CM | POA: Diagnosis not present

## 2024-04-04 HISTORY — PX: LUMBAR LAMINECTOMY/DECOMPRESSION MICRODISCECTOMY: SHX5026

## 2024-04-04 LAB — COMPREHENSIVE METABOLIC PANEL WITH GFR
ALT: 22 U/L (ref 0–44)
AST: 24 U/L (ref 15–41)
Albumin: 3.6 g/dL (ref 3.5–5.0)
Alkaline Phosphatase: 68 U/L (ref 38–126)
Anion gap: 10 (ref 5–15)
BUN: 16 mg/dL (ref 8–23)
CO2: 23 mmol/L (ref 22–32)
Calcium: 8.9 mg/dL (ref 8.9–10.3)
Chloride: 106 mmol/L (ref 98–111)
Creatinine, Ser: 0.96 mg/dL (ref 0.61–1.24)
GFR, Estimated: >60 mL/min (ref >60)
Glucose, Bld: 94 mg/dL (ref 70–99)
Potassium: 4 mmol/L (ref 3.5–5.1)
Sodium: 139 mmol/L (ref 135–145)
Total Bilirubin: 0.9 mg/dL (ref 0.0–1.2)
Total Protein: 6.3 g/dL — ABNORMAL LOW (ref 6.5–8.1)

## 2024-04-04 LAB — CBC
HCT: 41.9 % (ref 39.0–52.0)
Hemoglobin: 13.6 g/dL (ref 13.0–17.0)
MCH: 26.9 pg (ref 26.0–34.0)
MCHC: 32.5 g/dL (ref 30.0–36.0)
MCV: 82.8 fL (ref 80.0–100.0)
Platelets: 222 K/uL (ref 150–400)
RBC: 5.06 MIL/uL (ref 4.22–5.81)
RDW: 16.6 % — ABNORMAL HIGH (ref 11.5–15.5)
WBC: 4.7 K/uL (ref 4.0–10.5)
nRBC: 0 % (ref 0.0–0.2)

## 2024-04-04 LAB — SURGICAL PCR SCREEN
MRSA, PCR: NEGATIVE
Staphylococcus aureus: NEGATIVE

## 2024-04-04 SURGERY — LUMBAR LAMINECTOMY/DECOMPRESSION MICRODISCECTOMY 1 LEVEL
Anesthesia: General | Site: Back | Laterality: Left

## 2024-04-04 MED ORDER — SUGAMMADEX SODIUM 200 MG/2ML IV SOLN
INTRAVENOUS | Status: DC | PRN
  Administered 2024-04-04: 400 mg via INTRAVENOUS

## 2024-04-04 MED ORDER — GABAPENTIN 100 MG PO CAPS: 100.0000 mg | ORAL_CAPSULE | ORAL | Status: DC

## 2024-04-04 MED ORDER — BUPIVACAINE HCL (PF) 0.25 % IJ SOLN
INTRAMUSCULAR | Status: AC
  Filled 2024-04-04: qty 30

## 2024-04-04 MED ORDER — CHLORHEXIDINE GLUCONATE CLOTH 2 % EX PADS: 6.0000 | MEDICATED_PAD | Freq: Once | CUTANEOUS | Status: DC

## 2024-04-04 MED ORDER — ACETAMINOPHEN 650 MG RE SUPP: 650.0000 mg | RECTAL | Status: DC | PRN

## 2024-04-04 MED ORDER — LORATADINE 10 MG PO TABS
10.0000 mg | ORAL_TABLET | Freq: Every day | ORAL | Status: DC
  Administered 2024-04-04 – 2024-04-05 (×2): 10 mg via ORAL
  Filled 2024-04-04 (×2): qty 1

## 2024-04-04 MED ORDER — ACETAMINOPHEN 325 MG PO TABS
650.0000 mg | ORAL_TABLET | ORAL | Status: DC | PRN
Start: 2024-04-04 — End: 2024-04-05

## 2024-04-04 MED ORDER — ONDANSETRON HCL 4 MG/2ML IJ SOLN: 4.0000 mg | Freq: Four times a day (QID) | INTRAMUSCULAR | Status: DC | PRN

## 2024-04-04 MED ORDER — GABAPENTIN 100 MG PO CAPS
200.0000 mg | ORAL_CAPSULE | Freq: Every day | ORAL | Status: DC
  Administered 2024-04-04: 200 mg via ORAL
  Filled 2024-04-04: qty 2

## 2024-04-04 MED ORDER — FENTANYL CITRATE (PF) 250 MCG/5ML IJ SOLN
INTRAMUSCULAR | Status: DC | PRN
  Administered 2024-04-04: 50 ug via INTRAVENOUS
  Administered 2024-04-04 (×2): 100 ug via INTRAVENOUS

## 2024-04-04 MED ORDER — ORAL CARE MOUTH RINSE: 15.0000 mL | Freq: Once | OROMUCOSAL | Status: AC

## 2024-04-04 MED ORDER — FENTANYL CITRATE (PF) 250 MCG/5ML IJ SOLN
INTRAMUSCULAR | Status: AC
  Filled 2024-04-04: qty 5

## 2024-04-04 MED ORDER — PHENOL 1.4 % MT LIQD: 1.0000 | OROMUCOSAL | Status: DC | PRN

## 2024-04-04 MED ORDER — MENTHOL 3 MG MT LOZG: 1.0000 | LOZENGE | OROMUCOSAL | Status: DC | PRN

## 2024-04-04 MED ORDER — CYCLOBENZAPRINE HCL 10 MG PO TABS
10.0000 mg | ORAL_TABLET | Freq: Three times a day (TID) | ORAL | Status: DC | PRN
  Filled 2024-04-04: qty 1

## 2024-04-04 MED ORDER — THROMBIN 5000 UNITS EX KIT
PACK | CUTANEOUS | Status: AC
  Filled 2024-04-04: qty 1

## 2024-04-04 MED ORDER — FLUTICASONE PROPIONATE 50 MCG/ACT NA SUSP
1.0000 | Freq: Every day | NASAL | Status: DC
  Filled 2024-04-04: qty 16

## 2024-04-04 MED ORDER — CEFAZOLIN SODIUM-DEXTROSE 3-4 GM/150ML-% IV SOLN
3.0000 g | INTRAVENOUS | Status: AC
  Administered 2024-04-04: 2 g via INTRAVENOUS
  Filled 2024-04-04: qty 150

## 2024-04-04 MED ORDER — GLYCOPYRROLATE PF 0.2 MG/ML IJ SOSY
PREFILLED_SYRINGE | INTRAMUSCULAR | Status: AC
Start: 2024-04-04 — End: 2024-04-04
  Filled 2024-04-04: qty 1

## 2024-04-04 MED ORDER — ONDANSETRON HCL 4 MG PO TABS: 4.0000 mg | ORAL_TABLET | Freq: Four times a day (QID) | ORAL | Status: DC | PRN

## 2024-04-04 MED ORDER — MIDAZOLAM HCL 2 MG/2ML IJ SOLN
INTRAMUSCULAR | Status: AC
  Filled 2024-04-04: qty 2

## 2024-04-04 MED ORDER — PROPOFOL 10 MG/ML IV BOLUS
INTRAVENOUS | Status: AC
  Filled 2024-04-04: qty 20

## 2024-04-04 MED ORDER — ACETAMINOPHEN 10 MG/ML IV SOLN
1000.0000 mg | Freq: Once | INTRAVENOUS | Status: DC | PRN
  Administered 2024-04-04: 1000 mg via INTRAVENOUS

## 2024-04-04 MED ORDER — DOXEPIN HCL 10 MG PO CAPS
10.0000 mg | ORAL_CAPSULE | Freq: Every day | ORAL | Status: DC
  Filled 2024-04-04: qty 1

## 2024-04-04 MED ORDER — KETOROLAC TROMETHAMINE 15 MG/ML IJ SOLN
30.0000 mg | Freq: Four times a day (QID) | INTRAMUSCULAR | Status: DC
  Administered 2024-04-04 – 2024-04-05 (×3): 30 mg via INTRAVENOUS
  Filled 2024-04-04 (×3): qty 2

## 2024-04-04 MED ORDER — HYDROMORPHONE HCL 1 MG/ML IJ SOLN: 1.0000 mg | INTRAMUSCULAR | Status: DC | PRN

## 2024-04-04 MED ORDER — ROCURONIUM BROMIDE 10 MG/ML (PF) SYRINGE
PREFILLED_SYRINGE | INTRAVENOUS | Status: DC | PRN
  Administered 2024-04-04: 20 mg via INTRAVENOUS
  Administered 2024-04-04: 60 mg via INTRAVENOUS

## 2024-04-04 MED ORDER — OXYCODONE HCL 5 MG PO TABS
5.0000 mg | ORAL_TABLET | ORAL | Status: DC | PRN
  Administered 2024-04-04: 5 mg via ORAL
  Filled 2024-04-04: qty 1

## 2024-04-04 MED ORDER — OXYCODONE HCL 5 MG PO TABS
ORAL_TABLET | ORAL | Status: AC
  Filled 2024-04-04: qty 1

## 2024-04-04 MED ORDER — LACTATED RINGERS IV SOLN: INTRAVENOUS | Status: DC

## 2024-04-04 MED ORDER — FENTANYL CITRATE (PF) 100 MCG/2ML IJ SOLN
INTRAMUSCULAR | Status: AC
  Filled 2024-04-04: qty 2

## 2024-04-04 MED ORDER — OXYCODONE HCL 5 MG PO TABS
5.0000 mg | ORAL_TABLET | Freq: Once | ORAL | Status: AC | PRN
  Administered 2024-04-04: 5 mg via ORAL

## 2024-04-04 MED ORDER — PANTOPRAZOLE SODIUM 40 MG PO TBEC
40.0000 mg | DELAYED_RELEASE_TABLET | Freq: Every day | ORAL | Status: DC
  Administered 2024-04-05: 40 mg via ORAL
  Filled 2024-04-04: qty 1

## 2024-04-04 MED ORDER — CEFAZOLIN SODIUM-DEXTROSE 1-4 GM/50ML-% IV SOLN
1.0000 g | Freq: Three times a day (TID) | INTRAVENOUS | Status: AC
  Administered 2024-04-04 – 2024-04-05 (×2): 1 g via INTRAVENOUS
  Filled 2024-04-04 (×2): qty 50

## 2024-04-04 MED ORDER — THROMBIN (RECOMBINANT) 5000 UNITS EX SOLR: CUTANEOUS | Status: DC | PRN

## 2024-04-04 MED ORDER — CHLORHEXIDINE GLUCONATE 0.12 % MT SOLN: 15.0000 mL | Freq: Once | OROMUCOSAL | Status: AC

## 2024-04-04 MED ORDER — KETOROLAC TROMETHAMINE 30 MG/ML IJ SOLN
INTRAMUSCULAR | Status: AC
  Filled 2024-04-04: qty 1

## 2024-04-04 MED ORDER — OXYCODONE HCL 5 MG/5ML PO SOLN: 5.0000 mg | Freq: Once | ORAL | Status: AC | PRN

## 2024-04-04 MED ORDER — CARVEDILOL 25 MG PO TABS
25.0000 mg | ORAL_TABLET | Freq: Two times a day (BID) | ORAL | Status: DC
  Administered 2024-04-05: 25 mg via ORAL
  Filled 2024-04-04: qty 1

## 2024-04-04 MED ORDER — AMISULPRIDE (ANTIEMETIC) 5 MG/2ML IV SOLN: 10.0000 mg | Freq: Once | INTRAVENOUS | Status: DC | PRN

## 2024-04-04 MED ORDER — EPHEDRINE SULFATE-NACL 50-0.9 MG/10ML-% IV SOSY
PREFILLED_SYRINGE | INTRAVENOUS | Status: DC | PRN
  Administered 2024-04-04: 5 mg via INTRAVENOUS

## 2024-04-04 MED ORDER — SODIUM CHLORIDE 0.9% FLUSH
3.0000 mL | Freq: Two times a day (BID) | INTRAVENOUS | Status: DC
  Administered 2024-04-04: 3 mL via INTRAVENOUS

## 2024-04-04 MED ORDER — PHENYLEPHRINE 80 MCG/ML (10ML) SYRINGE FOR IV PUSH (FOR BLOOD PRESSURE SUPPORT)
PREFILLED_SYRINGE | INTRAVENOUS | Status: DC | PRN
  Administered 2024-04-04: 80 ug via INTRAVENOUS

## 2024-04-04 MED ORDER — LIDOCAINE 2% (20 MG/ML) 5 ML SYRINGE
INTRAMUSCULAR | Status: DC | PRN
  Administered 2024-04-04: 60 mg via INTRAVENOUS

## 2024-04-04 MED ORDER — OXYCODONE-ACETAMINOPHEN 10-325 MG PO TABS: 1.0000 | ORAL_TABLET | ORAL | Status: DC | PRN

## 2024-04-04 MED ORDER — 0.9 % SODIUM CHLORIDE (POUR BTL) OPTIME
TOPICAL | Status: DC | PRN
  Administered 2024-04-04: 1000 mL

## 2024-04-04 MED ORDER — ONDANSETRON HCL 4 MG/2ML IJ SOLN
INTRAMUSCULAR | Status: DC | PRN
  Administered 2024-04-04: 4 mg via INTRAVENOUS

## 2024-04-04 MED ORDER — DOFETILIDE 500 MCG PO CAPS
500.0000 ug | ORAL_CAPSULE | Freq: Two times a day (BID) | ORAL | Status: DC
Start: 2024-04-04 — End: 2024-04-05
  Administered 2024-04-04 – 2024-04-05 (×2): 500 ug via ORAL
  Filled 2024-04-04 (×2): qty 1

## 2024-04-04 MED ORDER — FENTANYL CITRATE (PF) 100 MCG/2ML IJ SOLN
25.0000 ug | INTRAMUSCULAR | Status: DC | PRN
  Administered 2024-04-04 (×3): 50 ug via INTRAVENOUS

## 2024-04-04 MED ORDER — ACETAMINOPHEN 10 MG/ML IV SOLN
INTRAVENOUS | Status: AC
  Filled 2024-04-04: qty 100

## 2024-04-04 MED ORDER — PROPOFOL 10 MG/ML IV BOLUS
INTRAVENOUS | Status: DC | PRN
  Administered 2024-04-04 (×2): 100 mg via INTRAVENOUS

## 2024-04-04 MED ORDER — MIDAZOLAM HCL 2 MG/2ML IJ SOLN
INTRAMUSCULAR | Status: DC | PRN
  Administered 2024-04-04: 2 mg via INTRAVENOUS

## 2024-04-04 MED ORDER — SODIUM CHLORIDE 0.9 % IV SOLN
250.0000 mL | INTRAVENOUS | Status: DC
  Administered 2024-04-04: 250 mL via INTRAVENOUS

## 2024-04-04 MED ORDER — DEXAMETHASONE SODIUM PHOSPHATE 10 MG/ML IJ SOLN
INTRAMUSCULAR | Status: DC | PRN
  Administered 2024-04-04: 10 mg via INTRAVENOUS

## 2024-04-04 MED ORDER — GABAPENTIN 100 MG PO CAPS
100.0000 mg | ORAL_CAPSULE | Freq: Every day | ORAL | Status: DC
  Administered 2024-04-05: 100 mg via ORAL
  Filled 2024-04-04: qty 1

## 2024-04-04 MED ORDER — BUPIVACAINE HCL (PF) 0.25 % IJ SOLN
INTRAMUSCULAR | Status: DC | PRN
Start: 2024-04-04 — End: 2024-04-04
  Administered 2024-04-04: 20 mL

## 2024-04-04 MED ORDER — CHLORHEXIDINE GLUCONATE 0.12 % MT SOLN
OROMUCOSAL | Status: AC
  Administered 2024-04-04: 15 mL via OROMUCOSAL
  Filled 2024-04-04: qty 15

## 2024-04-04 MED ORDER — SODIUM CHLORIDE 0.9% FLUSH: 3.0000 mL | INTRAVENOUS | Status: DC | PRN

## 2024-04-04 MED ORDER — IPRATROPIUM BROMIDE 0.06 % NA SOLN
2.0000 | Freq: Every day | NASAL | Status: DC
  Filled 2024-04-04: qty 15

## 2024-04-04 MED ORDER — METHIMAZOLE 5 MG PO TABS
5.0000 mg | ORAL_TABLET | Freq: Every day | ORAL | Status: DC
  Administered 2024-04-05: 5 mg via ORAL
  Filled 2024-04-04: qty 1

## 2024-04-04 MED ORDER — OXYCODONE-ACETAMINOPHEN 5-325 MG PO TABS
1.0000 | ORAL_TABLET | ORAL | Status: DC | PRN
  Filled 2024-04-04: qty 1

## 2024-04-04 SURGICAL SUPPLY — 39 items
BAG COUNTER SPONGE SURGICOUNT (BAG) ×1 IMPLANT
BAND RUBBER #18 3X1/16 STRL (MISCELLANEOUS) ×2 IMPLANT
BENZOIN TINCTURE PRP APPL 2/3 (GAUZE/BANDAGES/DRESSINGS) ×1 IMPLANT
BLADE CLIPPER SURG (BLADE) IMPLANT
BUR CUTTER 7.0 ROUND (BURR) ×1 IMPLANT
CANISTER SUCTION 3000ML PPV (SUCTIONS) ×1 IMPLANT
DERMABOND ADVANCED .7 DNX12 (GAUZE/BANDAGES/DRESSINGS) ×1 IMPLANT
DRAPE HALF SHEET 40X57 (DRAPES) IMPLANT
DRAPE LAPAROTOMY 100X72X124 (DRAPES) ×1 IMPLANT
DRAPE MICROSCOPE SLANT 54X150 (MISCELLANEOUS) ×1 IMPLANT
DRAPE SURG 17X23 STRL (DRAPES) ×2 IMPLANT
DRSG OPSITE POSTOP 4X6 (GAUZE/BANDAGES/DRESSINGS) IMPLANT
DURAPREP 26ML APPLICATOR (WOUND CARE) ×1 IMPLANT
ELECTRODE REM PT RTRN 9FT ADLT (ELECTROSURGICAL) ×1 IMPLANT
GAUZE 4X4 16PLY ~~LOC~~+RFID DBL (SPONGE) IMPLANT
GLOVE BIO SURGEON STRL SZ 6 (GLOVE) ×1 IMPLANT
GLOVE BIOGEL PI IND STRL 6.5 (GLOVE) ×1 IMPLANT
GLOVE ECLIPSE 9.0 STRL (GLOVE) ×1 IMPLANT
GLOVE EXAM NITRILE XL STR (GLOVE) IMPLANT
GOWN STRL REUS W/ TWL LRG LVL3 (GOWN DISPOSABLE) IMPLANT
GOWN STRL REUS W/ TWL XL LVL3 (GOWN DISPOSABLE) ×1 IMPLANT
GOWN STRL REUS W/TWL 2XL LVL3 (GOWN DISPOSABLE) IMPLANT
KIT BASIN OR (CUSTOM PROCEDURE TRAY) ×1 IMPLANT
KIT TURNOVER KIT B (KITS) ×1 IMPLANT
NDL HYPO 22X1.5 SAFETY MO (MISCELLANEOUS) ×1 IMPLANT
NDL SPNL 22GX3.5 QUINCKE BK (NEEDLE) IMPLANT
NEEDLE HYPO 22X1.5 SAFETY MO (MISCELLANEOUS) ×1 IMPLANT
NEEDLE SPNL 22GX3.5 QUINCKE BK (NEEDLE) IMPLANT
NS IRRIG 1000ML POUR BTL (IV SOLUTION) ×1 IMPLANT
PACK LAMINECTOMY NEURO (CUSTOM PROCEDURE TRAY) ×1 IMPLANT
PAD ARMBOARD POSITIONER FOAM (MISCELLANEOUS) ×3 IMPLANT
SPIKE FLUID TRANSFER (MISCELLANEOUS) ×1 IMPLANT
SPONGE SURGIFOAM ABS GEL SZ50 (HEMOSTASIS) ×1 IMPLANT
STRIP CLOSURE SKIN 1/2X4 (GAUZE/BANDAGES/DRESSINGS) ×1 IMPLANT
SUT VIC AB 2-0 CT1 18 (SUTURE) ×1 IMPLANT
SUT VIC AB 3-0 SH 8-18 (SUTURE) ×1 IMPLANT
TOWEL GREEN STERILE (TOWEL DISPOSABLE) ×1 IMPLANT
TOWEL GREEN STERILE FF (TOWEL DISPOSABLE) ×1 IMPLANT
WATER STERILE IRR 1000ML POUR (IV SOLUTION) ×1 IMPLANT

## 2024-04-04 NOTE — Transfer of Care (Signed)
 Immediate Anesthesia Transfer of Care Note  Patient: [name]  Procedure(s) Performed: LEFT LUMBAR FIVE-SACRAL ONE LUMBAR LAMINECTOMY/DECOMPRESSION MICRODISCECTOMY (Left: Back)  Patient Location: PACU  Anesthesia Type:General  Level of Consciousness: awake, alert , and oriented  Airway & Oxygen Therapy: Patient Spontanous Breathing  Post-op Assessment: Report given to RN and Post -op Vital signs reviewed and stable  Post vital signs: Reviewed and stable  Last Vitals:  Vitals Value Taken Time  BP 145/86 04/04/24 16:45  Temp    Pulse 50 04/04/24 17:00  Resp 10 04/04/24 16:59  SpO2 96 % 04/04/24 17:00  Vitals shown include unfiled device data.  Last Pain:  Vitals:   04/04/24 1328  TempSrc:   PainSc: 9          Complications: No notable events documented.

## 2024-04-04 NOTE — Brief Op Note (Signed)
 04/04/2024  4:18 PM  PATIENT:  [name]  63 y.o. male  PRE-OPERATIVE DIAGNOSIS:  Radiculopathy lumbar region  POST-OPERATIVE DIAGNOSIS:  Radiculopathy lumbar region  PROCEDURE:  Procedure(s) with comments: LEFT LUMBAR FIVE-SACRAL ONE LUMBAR LAMINECTOMY/DECOMPRESSION MICRODISCECTOMY (Left) - Laminectomy and Foraminotomy - left - L5-S1  SURGEON:  Surgeons and Role:    DEWAINE Louis Shove, MD - Primary  PHYSICIAN ASSISTANT:   ASSISTANTSBETHA Jennetta PIETY   ANESTHESIA:   general  EBL:  50cc  BLOOD ADMINISTERED:none  DRAINS: none   LOCAL MEDICATIONS USED:  MARCAINE      SPECIMEN:  No Specimen  DISPOSITION OF SPECIMEN:  N/A  COUNTS:  YES  TOURNIQUET:  * No tourniquets in log *  DICTATION: .Dragon Dictation  PLAN OF CARE: Admit for overnight observation  PATIENT DISPOSITION:  PACU - hemodynamically stable.   Delay start of Pharmacological VTE agent (>24hrs) due to surgical blood loss or risk of bleeding: yes

## 2024-04-04 NOTE — H&P (Signed)
 [name] is an 63 y.o. male.   Chief Complaint: Left leg pain. HPI: 63 year old male remotely status post C3-4 and C4-5 posterior lumbar fusions with good results presents now with worsening left-sided lumbosacral pain with radiation to his left lower extremity and a recently typical left-sided S1 radicular pattern.  Symptoms are failed conservative management.  Workup demonstrates evidence of disc generation with associated spondylosis and lateral recess stenosis on the left at L5-S1 with some small associated disc herniation.  The patient has failed conservative management presents now for left-sided L5-S1 decompressive laminotomy and possible microdiscectomy.  Past Medical History:  Diagnosis Date   Atrial fibrillation [facility])    s/p afib ablation  PVI 5/12 JA   CAD -nonobstructive    S/P nstemi-type II cath 05/2008 - nonobs. dzs.   Cataract    left eye 2019   Colon polyps    Complication of anesthesia    last 2 surgeries pt had some respiratory issues following surgery required ED visit, dx with bronchitis.    DDD (degenerative disc disease)    evaluate by neurosurgery is ongoing   Depression    ptsd   Duodenal adenoma    Erectile dysfunction    GERD (gastroesophageal reflux disease)    Headache(784.0)    Hemorrhoids    Hiatal hernia    HTN (hypertension)    Hyperlipemia    Hyperthyroidism    Graves dz on methimazole  and followed by Dr Kassie   Myocardial infarction [facility])    2009   Obesity    Pancreatitis    Polysubstance abuse (HCC)    cocaine, last used 1999   Presence of permanent cardiac pacemaker    since 2009   PUD (peptic ulcer disease)    gastritis due to ETOH previously   Pulmonary embolism (HCC) 04/2003   Hx of   Rectal bleeding    Sinus node dysfunction (HCC)    s/p MDT PPM - AAI - V port plugged   Sleep apnea    uses CPAP [facility] 2 YRS   Systolic and diastolic CHF, chronic (HCC)    EF 35% by most recent echo 09/19/10   Tooth absence 2014    recent ly had tooth pulled painful    Past Surgical History:  Procedure Laterality Date   ANTERIOR CERVICAL DECOMP/DISCECTOMY FUSION  12/15/2011   Procedure: ANTERIOR CERVICAL DECOMPRESSION/DISCECTOMY FUSION 1 LEVEL;  Surgeon: Victory DELENA Gunnels, MD;  Location: [facility];  Service: Neurosurgery;  Laterality: N/A;  Cervical Six-Seven Anterior Cervical Diskectomy fusion with Allograft and Plating   ANTERIOR CERVICAL DECOMP/DISCECTOMY FUSION N/A 01/01/2020   Procedure: Anterior Cervical Discectomy Fusion - Cervical Seven- Thoracic One;  Surgeon: Gunnels Victory, MD;  Location: [facility] OR;  Service: Neurosurgery;  Laterality: N/A;  Anterior Cervical Discectomy Fusion - Cervical Seven- Thoracic One   ATRIAL ABLATION SURGERY  12/30/2010   afib ablation by Dr Kelsie   ATRIAL FIBRILLATION ABLATION N/A 05/06/2017   Procedure: Atrial Fibrillation Ablation;  Surgeon: [name], MD;  Location: [facility] INVASIVE CV LAB;  Service: Cardiovascular;  Laterality: N/A;   BACK SURGERY     CARDIAC CATHETERIZATION     CADIAC ABLATION DR KELSIE 12/2010   CARDIAC CATHETERIZATION N/A 05/08/2016   Procedure: Left Heart Cath and Coronary Angiography;  Surgeon: Peter M Swaziland, MD;  Location: [facility] INVASIVE CV LAB;  Service: Cardiovascular;  Laterality: N/A;   Fingers removed from right hand.  10/01/1982   traumatic work injury   INSERT / REPLACE /  REMOVE PACEMAKER     05/2008     KNEE ARTHROSCOPY Left 06/03/2021   LUMBAR SPINE SURGERY  02/28/2009   Dr. Malcolm   PACEMAKER INSERTION  05/31/2008   by Dr Fernande   TEE WITHOUT CARDIOVERSION N/A 05/05/2017   Procedure: TRANSESOPHAGEAL ECHOCARDIOGRAM (TEE);  Surgeon: Rolan Ezra RAMAN, MD;  Location: [facility] ENDOSCOPY;  Service: Cardiovascular;  Laterality: N/A;    Family History  Problem Relation Age of Onset   Heart disease Mother    Breast cancer Mother    Lung cancer Father        non-smoker   Diabetes Other    Colon cancer Neg Hx    Esophageal cancer Neg Hx    Rectal cancer Neg  Hx    Stomach cancer Neg Hx    Social History:  reports that he quit smoking about 21 years ago. His smoking use included cigarettes. He started smoking about 36 years ago. He has a 15 pack-year smoking history. He has never used smokeless tobacco. He reports current alcohol use. He reports that he does not use drugs.  Allergies:  Allergies  Allergen Reactions   Celecoxib Itching   Hydrocodone  Itching   Prednisolone Itching   Potassium Chloride  Other (See Comments)    Dizziness and nausea     Medications Prior to Admission  Medication Sig Dispense Refill   atorvastatin  (LIPITOR) 40 MG tablet Take 40 mg by mouth daily.     carvedilol  (COREG ) 25 MG tablet Take 1 tablet (25 mg total) by mouth 2 (two) times daily with a meal. 180 tablet 2   Cholecalciferol  1.25 MG (50000 UT) capsule Take by mouth.     dexlansoprazole  (DEXILANT ) 60 MG capsule Take 1 capsule (60 mg total) by mouth daily. Please keep your April 3rd appointment for further refills. Thank you 30 capsule 11   diclofenac Sodium (VOLTAREN) 1 % GEL Apply 4 g topically 4 (four) times daily.     dofetilide  (TIKOSYN ) 500 MCG capsule TAKE 1 CAPSULE BY MOUTH TWICE A DAY 180 capsule 2   fluticasone  (FLONASE ) 50 MCG/ACT nasal spray Place into both nostrils.     gabapentin  (NEURONTIN ) 100 MG capsule Take 1-2 capsules (100-200 mg total) by mouth See admin instructions. Patient takes 1 tablet in the morning and 2 tablets at night 60 capsule 3   ipratropium (ATROVENT ) 0.06 % nasal spray 2 sprays Once Daily.     loratadine  (CLARITIN ) 10 MG tablet Take 1 tablet (10 mg total) by mouth daily. 30 tablet PRN   methimazole  (TAPAZOLE ) 5 MG tablet Take 1 tablet by mouth daily.     olmesartan (BENICAR) 20 MG tablet Take 20 mg by mouth daily.     Oxycodone  HCl 10 MG TABS Take by mouth.     oxyCODONE -acetaminophen  (PERCOCET) 10-325 MG tablet Take 1 tablet by mouth 4 (four) times daily as needed.     terbinafine (LAMISIL) 1 % cream Apply topically.      triamcinolone 0.1%-Cetaphil equivalent 1:1 cream mixture Apply topically.     desonide  (DESOWEN ) 0.05 % cream Apply 1 application topically 2 (two) times daily as needed (for rash).      doxepin  (SINEQUAN ) 10 MG capsule Take 10 mg by mouth at bedtime.     ELIQUIS  5 MG TABS tablet TAKE 1 TABLET BY MOUTH TWICE A DAY 180 tablet 1   triamcinolone ointment (KENALOG) 0.1 % APPLY MODERATE AMOUNT TO AFFECTED AREA DAILY RASH     WEGOVY 0.5 MG/0.5ML SOAJ Inject 1 mg  into the skin once a week.      Results for orders placed or performed during the hospital encounter of 04/04/24 (from the past 48 hours)  Comprehensive metabolic panel per protocol     Status: Abnormal   Collection Time: 04/04/24  1:32 PM  Result Value Ref Range   Sodium 139 135 - 145 mmol/L   Potassium 4.0 3.5 - 5.1 mmol/L   Chloride 106 98 - 111 mmol/L   CO2 23 22 - 32 mmol/L   Glucose, Bld 94 70 - 99 mg/dL    Comment: Glucose reference range applies only to samples taken after fasting for at least 8 hours.   BUN 16 8 - 23 mg/dL   Creatinine, Ser 9.03 0.61 - 1.24 mg/dL   Calcium  8.9 8.9 - 10.3 mg/dL   Total Protein 6.3 (L) 6.5 - 8.1 g/dL   Albumin  3.6 3.5 - 5.0 g/dL   AST 24 15 - 41 U/L   ALT 22 0 - 44 U/L   Alkaline Phosphatase 68 38 - 126 U/L   Total Bilirubin 0.9 0.0 - 1.2 mg/dL   GFR, Estimated >39 >39 mL/min    Comment: (NOTE) Calculated using the CKD-EPI Creatinine Equation (2021)    Anion gap 10 5 - 15    Comment: Performed at [facility], [address], [place], [place] [address]  CBC per protocol     Status: Abnormal   Collection Time: 04/04/24  1:32 PM  Result Value Ref Range   WBC 4.7 4.0 - 10.5 K/uL   RBC 5.06 4.22 - 5.81 MIL/uL   Hemoglobin 13.6 13.0 - 17.0 g/dL   HCT 58.0 60.9 - 47.9 %   MCV 82.8 80.0 - 100.0 fL   MCH 26.9 26.0 - 34.0 pg   MCHC 32.5 30.0 - 36.0 g/dL   RDW 83.3 (H) 88.4 - 84.4 %   Platelets 222 150 - 400 K/uL   nRBC 0.0 0.0 - 0.2 %    Comment: Performed at [facility]  Lab, [address], [place], [place] [address]   No results found.  Pertinent items noted in HPI and remainder of comprehensive ROS otherwise negative.  Blood pressure (!) 134/93, pulse (!) 55, temperature 98.6 F (37 C), temperature source Oral, resp. rate 17, height 5' 11 (1.803 m), weight 127.9 kg, SpO2 97%.  Patient is awake and alert.  He is oriented and appropriate.  Speech is fluent.  Judgment insight are intact.  Cranial nerve function normal bilateral.  Motor examination reveals intact motor strength bilaterally sensory examination with some patchy distal sensory loss in both lower extremities.  Reflexes are normal active.  No evidence of long track signs.  Examination head ears eyes nose throat is unremarked.  Chest and abdomen are benign.  Extremities are notable for multiple finger amputations but no other deformity or injury. Assessment/Plan Left L5-S1 spondylosis with lateral recess stenosis and small associated disc herniation.  Plan left L5-S1 decompressive laminotomy and possible microdiscectomy.  Risks and benefits been explained.  Patient wishes to proceed.  [facility] LABOR Gilberto Streck 04/04/2024, 2:21 PM

## 2024-04-04 NOTE — Op Note (Signed)
 Date of procedure: 04/04/2024  Date of dictation: Same  Service: Neurosurgery  Preoperative diagnosis: Left L5-S1 spondylosis with radiculopathy, left L5-S1 herniated nucleus pulposus  Postoperative diagnosis: Same  Procedure Name: Left L5-S1 decompressive laminotomy with foraminotomies and left L5-S1 microdiscectomy  Surgeon:Jac Romulus A.Lantz Hermann, M.D.  Asst. Surgeon: Jennetta, NP  Anesthesia: General  Indication: 63 year old male remotely status post L3-4 and L4-5 decompression and fusion.  Patient with persistent left lumbosacral pain with radiation to his left lower extremity consistent with a left-sided S1 radiculopathy.  Workup demonstrates evidence for leftward L5-S1 disc herniation with associated spondylosis and significant lateral recess stenosis.  Patient has failed conservative management presents now for left-sided L5-S1 laminotomy and microdiscectomy in hopes of improving his symptoms.  Operative note: After induction of anesthesia, patient positioned prone on the Wilson frame and properly padded.  Lumbar region prepped and draped sterilely.  Incision made overlying L5-S1.  Dissection performed in the left.  Retractor placed.  X-ray taken.  Level confirmed.  Laminotomy performed using high-speed drill and Kerrison rongeurs.  Ligament flavum elevated and resected.  Underlying thecal sac and left S1 nerve root identified.  Microscope brought in field used microdissection of the spinal canal.  Epidural venous plexus was coagulated cut.  Thecal sac and left S1 nerve root gently mobilized and retracted medially.  Disc space with associated disc herniation was encountered.  Disc base was incised with a 15 blade.  Disc herniation was then removed.  Disc base was entered and all loose or obviously degenerative disc.  Was moved to the interspace.  At this point a very thorough discectomy been achieved.  There was no evidence of injury to the thecal sac or nerve roots.  Wound was then irrigated with  antibiotic solution.  Gelfoam was placed topically for hemostasis.  Wound was then closed in layers with Vicryl sutures.  Steri-Strips and sterile dressing were applied.  No apparent complications.  Patient tolerated the procedure well and he returns to recovery room postop.

## 2024-04-04 NOTE — Anesthesia Procedure Notes (Signed)
 Procedure Name: Intubation Date/Time: 04/04/2024 2:53 PM  Performed by: Hedy Jarred, CRNAPre-anesthesia Checklist: Patient identified, Emergency Drugs available, Suction available and Patient being monitored Patient Re-evaluated:Patient Re-evaluated prior to induction Oxygen Delivery Method: Circle System Utilized Preoxygenation: Pre-oxygenation with 100% oxygen Induction Type: IV induction Ventilation: Mask ventilation without difficulty Laryngoscope Size: Mac and 4 Tube type: Oral Tube size: 8.0 mm Number of attempts: 1 Airway Equipment and Method: Stylet and Oral airway Placement Confirmation: ETT inserted through vocal cords under direct vision, positive ETCO2 and breath sounds checked- equal and bilateral Secured at: 23 cm Tube secured with: Tape Dental Injury: Teeth and Oropharynx as per pre-operative assessment

## 2024-04-05 ENCOUNTER — Encounter (HOSPITAL_COMMUNITY): Payer: Self-pay | Admitting: Neurosurgery

## 2024-04-05 DIAGNOSIS — M4727 Other spondylosis with radiculopathy, lumbosacral region: Secondary | ICD-10-CM | POA: Diagnosis not present

## 2024-04-05 MED ORDER — CYCLOBENZAPRINE HCL 10 MG PO TABS: 10.0000 mg | ORAL_TABLET | Freq: Three times a day (TID) | ORAL | 0 refills | Status: DC | PRN

## 2024-04-05 MED FILL — Thrombin For Soln 5000 Unit: CUTANEOUS | Qty: 2 | Status: AC

## 2024-04-05 NOTE — Progress Notes (Signed)

## 2024-04-05 NOTE — Evaluation (Signed)
 Occupational Therapy Evaluation Patient Details Name: [name] MRN: [id number] DOB: [birth year] Today's Date: 04/05/2024   History of Present Illness   63 yo male presenting to [facility] on 04/04/24 with L5-S1 radiculopathy s/p L5-S1 laminectomy and decompressive microdiscectomy. PMHx significant for A-fib, CAD, NSTMI 2009, DDD, depression, HTN, HLD, Graves Disease, cardiac pacemaker, and OSA.     Clinical Impressions Pt admitted for above, PTA pt lived alone but has some assist at DC. Pt educated on POB precautions and compensatory strategies for ADLs/iADLs and transfers. Pt does bend a bit during ADLs, supplied him with AE to promote maintenance of back precautions, demonstrated use of Sock aid to assist with socks. Pt verbalized understanding of all education, needs CGA to supervision assist for ADLs and ambulating with supervision no AD. Pt has no further acute skilled OT needs. No post acute OT recommended.      If plan is discharge home, recommend the following:   Assistance with cooking/housework;Assist for transportation     Functional Status Assessment   Patient has not had a recent decline in their functional status     Equipment Recommendations   None recommended by OT     Recommendations for Other Services         Precautions/Restrictions   Precautions Precautions: Back Precaution Booklet Issued: Yes (comment) Recall of Precautions/Restrictions: Intact Precaution/Restrictions Comments: Reviewed back precautions with handout Required Braces or Orthoses:  (no brace needed) Restrictions Weight Bearing Restrictions Per Provider Order: No     Mobility Bed Mobility Overal bed mobility: Needs Assistance Bed Mobility: Rolling, Sidelying to Sit Rolling: Supervision Sidelying to sit: Supervision            Transfers Overall transfer level: Needs assistance Equipment used: None Transfers: Sit to/from Stand Sit to Stand: Supervision                   Balance Overall balance assessment: Mild deficits observed, not formally tested                                         ADL either performed or assessed with clinical judgement   ADL Overall ADL's : Needs assistance/impaired Eating/Feeding: Independent;Sitting   Grooming: Standing;Supervision/safety Grooming Details (indicate cue type and reason): educated pt on cups for oral care Upper Body Bathing: Independent;Sitting   Lower Body Bathing: Contact guard assist;Sit to/from stand Lower Body Bathing Details (indicate cue type and reason): Pt demonstrated how he props his BLEs on his tub to bath, looked like too much bending. Supplied pt with LH sponge to maintain back precautions. Upper Body Dressing : Sitting;Independent   Lower Body Dressing: Sitting/lateral leans;Set up Lower Body Dressing Details (indicate cue type and reason): Pt able to prop BLEs on bed, more trouble with showes. supplied with LH shoe horn. Toilet Transfer: Supervision/safety;Ambulation   Toileting- Clothing Manipulation and Hygiene: Sit to/from stand;Supervision/safety       Functional mobility during ADLs: Supervision/safety       Vision Baseline Vision/History: 4 Cataracts Additional Comments: awaiting cataracts sx     Perception Perception: Not tested       Praxis Praxis: WFL       Pertinent Vitals/Pain Pain Assessment Pain Assessment: 0-10 Pain Score: 5  Pain Location: back, op site Pain Descriptors / Indicators: Aching, Pressure Pain Intervention(s): Monitored during session, Limited activity within patient's tolerance     Extremity/Trunk Assessment Upper Extremity  Assessment Upper Extremity Assessment: Overall WFL for tasks assessed;RUE deficits/detail RUE Deficits / Details: 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   Lower Extremity Assessment Lower Extremity Assessment: Overall WFL for tasks assessed (similar bilaterally, no c/o numbness or pain)   Cervical / Trunk  Assessment Cervical / Trunk Assessment: Back Surgery   Communication Communication Communication: No apparent difficulties   Cognition Arousal: Alert Behavior During Therapy: WFL for tasks assessed/performed Cognition: No apparent impairments             OT - Cognition Comments: min verbal cues to maintain back precautions during dressing.                 Following commands: Intact       Cueing  General Comments   Cueing Techniques: Verbal cues  incision c/d/i   Exercises     Shoulder Instructions      Home Living Family/patient expects to be discharged to:: Private residence Living Arrangements: Alone Available Help at Discharge: Family;Available PRN/intermittently;Friend(s) Type of Home: House Home Access: Stairs to enter [employer] of Steps: 2 Entrance Stairs-Rails: Left Home Layout: One level     Bathroom Shower/Tub: Chief Strategy Officer: Standard     Home Equipment: Agricultural consultant (2 wheels);Cane - single point;Hand held shower head   Additional Comments: Girlfriend will assist some at DC. 2 falls PTA secondary to pain per pt report      Prior Functioning/Environment Prior Level of Function : Independent/Modified Independent;History of Falls (last six months);Driving             Mobility Comments: no AD      OT Problem List: Pain   OT Treatment/Interventions:        OT Goals(Current goals can be found in the care plan section)   Acute Rehab OT Goals Patient Stated Goal: go home OT Goal Formulation: All assessment and education complete, DC therapy Time For Goal Achievement: 04/19/24 Potential to Achieve Goals: Good   OT Frequency:       Co-evaluation              AM-PAC OT 6 Clicks Daily Activity     Outcome Measure Help from another person eating meals?: None Help from another person taking care of personal grooming?: A Little Help from another person toileting, which includes using  toliet, bedpan, or urinal?: A Little Help from another person bathing (including washing, rinsing, drying)?: A Little Help from another person to put on and taking off regular upper body clothing?: None Help from another person to put on and taking off regular lower body clothing?: A Little 6 Click Score: 20   End of Session Equipment Utilized During Treatment: Gait belt Nurse Communication: Mobility status  Activity Tolerance: Patient tolerated treatment well Patient left: in bed;with call bell/phone within reach  OT Visit Diagnosis: Pain Pain - Right/Left:  (back)                Time: 9168-9147 OT Time Calculation (min): 21 min Charges:  OT General Charges $OT Visit: 1 Visit OT Evaluation $OT Eval Low Complexity: 1 Low  04/05/2024  AB, OTR/L  Acute Rehabilitation Services  Office: [phone]   Curtistine JONETTA Das 04/05/2024, 9:56 AM

## 2024-04-05 NOTE — Plan of Care (Signed)

## 2024-04-05 NOTE — Anesthesia Postprocedure Evaluation (Signed)
 Anesthesia Post Note  Patient: TORENCE PALMERI  Procedure(s) Performed: LEFT LUMBAR FIVE-SACRAL ONE LUMBAR LAMINECTOMY/DECOMPRESSION MICRODISCECTOMY (Left: Back)     Patient location during evaluation: PACU Anesthesia Type: General Level of consciousness: awake Pain management: pain level controlled Vital Signs Assessment: post-procedure vital signs reviewed and stable Respiratory status: spontaneous breathing, nonlabored ventilation and respiratory function stable Cardiovascular status: blood pressure returned to baseline and stable Postop Assessment: no apparent nausea or vomiting Anesthetic complications: no   No notable events documented.  Last Vitals:  Vitals:   04/04/24 2353 04/05/24 0546  BP: (!) 142/83   Pulse: (!) 51 64  Resp: 20   Temp: (!) 36.4 C   SpO2: 96% 100%    Last Pain:  Vitals:   04/04/24 2353  TempSrc: Oral  PainSc:    Pain Goal: Patients Stated Pain Goal: 2 (04/04/24 2035)                 Bernardino P Linzie Boursiquot

## 2024-04-05 NOTE — Discharge Summary (Signed)
 Physician Discharge Summary  Patient ID: [name] MRN: [id number] DOB/AGE: [birth year] 65 y.o.  Admit date: 04/04/2024 Discharge date: 04/05/2024  Admission Diagnoses:  Discharge Diagnoses:  Principal Problem:   Lumbar radiculopathy   Discharged Condition: good  Hospital Course: Patient admitted to the hospital where underwent uncomplicated L5-S1 decompression and microdiscectomy.  Postoperatively doing very well.  Preoperative back and radicular pain much improved.  Standing ambulating and voiding without difficulty.  Ready for discharge home.  Consults:   Significant Diagnostic Studies:   Treatments:   Discharge Exam: Blood pressure 138/88, pulse 63, temperature (!) 97.5 F (36.4 C), temperature source Oral, resp. rate 17, height 5' 11 (1.803 m), weight 127.9 kg, SpO2 97%. Awake and alert.  Oriented and appropriate.  Motor/function intact.  Wound clean and dry.  Chest and abdomen benign.  Disposition: Discharge disposition: 01-Home or Self Care        Allergies as of 04/05/2024       Reactions   Celecoxib Itching   Hydrocodone  Itching   Prednisolone Itching   Potassium Chloride  Other (See Comments)   Dizziness and nausea         Medication List     TAKE these medications    atorvastatin  40 MG tablet Commonly known as: LIPITOR Take 40 mg by mouth daily.   carvedilol  25 MG tablet Commonly known as: COREG  Take 1 tablet (25 mg total) by mouth 2 (two) times daily with a meal.   Cholecalciferol  1.25 MG (50000 UT) capsule Take by mouth.   cyclobenzaprine  10 MG tablet Commonly known as: FLEXERIL  Take 1 tablet (10 mg total) by mouth 3 (three) times daily as needed for muscle spasms.   desonide  0.05 % cream Commonly known as: DESOWEN  Apply 1 application topically 2 (two) times daily as needed (for rash).   dexlansoprazole  60 MG capsule Commonly known as: DEXILANT  Take 1 capsule (60 mg total) by mouth daily. Please keep your April 3rd appointment  for further refills. Thank you   diclofenac Sodium 1 % Gel Commonly known as: VOLTAREN Apply 4 g topically 4 (four) times daily.   dofetilide  500 MCG capsule Commonly known as: TIKOSYN  TAKE 1 CAPSULE BY MOUTH TWICE A DAY   doxepin  10 MG capsule Commonly known as: SINEQUAN  Take 10 mg by mouth at bedtime.   Eliquis  5 MG Tabs tablet Generic drug: apixaban  TAKE 1 TABLET BY MOUTH TWICE A DAY   fluticasone  50 MCG/ACT nasal spray Commonly known as: FLONASE  Place into both nostrils.   gabapentin  100 MG capsule Commonly known as: NEURONTIN  Take 1-2 capsules (100-200 mg total) by mouth See admin instructions. Patient takes 1 tablet in the morning and 2 tablets at night   ipratropium 0.06 % nasal spray Commonly known as: ATROVENT  2 sprays Once Daily.   loratadine  10 MG tablet Commonly known as: CLARITIN  Take 1 tablet (10 mg total) by mouth daily.   methimazole  5 MG tablet Commonly known as: TAPAZOLE  Take 1 tablet by mouth daily.   olmesartan 20 MG tablet Commonly known as: BENICAR Take 20 mg by mouth daily.   Oxycodone  HCl 10 MG Tabs Take by mouth.   oxyCODONE -acetaminophen  10-325 MG tablet Commonly known as: PERCOCET Take 1 tablet by mouth 4 (four) times daily as needed.   terbinafine 1 % cream Commonly known as: LAMISIL Apply topically.   triamcinolone 0.1%-Cetaphil equivalent 1:1 cream mixture Apply topically.   triamcinolone ointment 0.1 % Commonly known as: KENALOG APPLY MODERATE AMOUNT TO AFFECTED AREA DAILY RASH  Wegovy 1.7 MG/0.75ML Soaj Generic drug: Semaglutide-Weight Management Inject 1.7 mg into the skin once a week.         Signed: [name] 04/05/2024, 8:19 AM

## 2024-04-05 NOTE — Discharge Instructions (Addendum)
 Wound Care Keep incision covered and dry until post op day 3. You may remove the Honeycomb dressing on post op day 3. Leave steri-strips on back.  They will fall off by themselves. Do not put any creams, lotions, or ointments on incision. You are fine to shower. Let water run over incision and pat dry.   Activity Walk each and every day, increasing distance each day. No lifting greater than 8 lbs.  No lifting no bending no twisting no driving . 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   Diet Resume your normal diet.   Return to Work Will be discussed at your follow up appointment.  Call Your Doctor If Any of These Occur Redness, drainage, or swelling at the wound.  Temperature greater than 101 degrees. Severe pain not relieved by pain medication. Incision starts to come apart.  Follow Up Appt Call [phone] if you have one or any problem.   Resume Eliquis  on Saturday

## 2024-04-24 ENCOUNTER — Other Ambulatory Visit: Payer: Self-pay | Admitting: Nurse Practitioner

## 2024-05-03 ENCOUNTER — Ambulatory Visit (INDEPENDENT_AMBULATORY_CARE_PROVIDER_SITE_OTHER): Payer: Medicaid Other

## 2024-05-03 DIAGNOSIS — I480 Paroxysmal atrial fibrillation: Secondary | ICD-10-CM | POA: Diagnosis not present

## 2024-05-04 DIAGNOSIS — G894 Chronic pain syndrome: Secondary | ICD-10-CM | POA: Diagnosis not present

## 2024-05-04 DIAGNOSIS — M5416 Radiculopathy, lumbar region: Secondary | ICD-10-CM | POA: Diagnosis not present

## 2024-05-04 LAB — CUP PACEART REMOTE DEVICE CHECK
Battery Impedance: 7274 Ohm
Battery Voltage: 2.64 V
Brady Statistic RV Percent Paced: 0 %
Date Time Interrogation Session: 20250904201732
Implantable Lead Connection Status: 753985
Implantable Lead Implant Date: 20091023
Implantable Lead Location: 753859
Implantable Lead Model: 5076
Implantable Pulse Generator Implant Date: 20091023
Lead Channel Impedance Value: 48292 Ohm
Lead Channel Impedance Value: 67 Ohm
Lead Channel Setting Pacing Amplitude: 3.5 V
Lead Channel Setting Pacing Pulse Width: 0.4 ms
Lead Channel Setting Sensing Sensitivity: 2.8 mV
Zone Setting Status: 755011

## 2024-05-05 ENCOUNTER — Telehealth: Payer: Self-pay

## 2024-05-05 ENCOUNTER — Ambulatory Visit: Payer: Self-pay | Admitting: Cardiology

## 2024-05-05 NOTE — Telephone Encounter (Signed)
 Spoke w/ patient - he is scheduled for 9/11 w/ Aniceto, NP!

## 2024-05-05 NOTE — Telephone Encounter (Signed)
 PPM Scheduled remote reviewed. Normal device function.  Presenting rhythm:  VP Battery reached RRT 04/11/24   ___________________________________________________________________  Beatris w/ patient regarding need for PPM generator replacement. RRT reached on 04/11/2024. Patient aware 3 months of battery longevity remains from date RRT occurred.   Patient needs OV to discuss procedure w/ provider. Will forward to scheduling team in order to schedule OV regarding generator replacement.   Informed patient to call device clinic with any further questions or concerns.

## 2024-05-09 NOTE — Progress Notes (Unsigned)
  Electrophysiology Office Note:   Date:  05/09/2024  ID:  RAWLEY HARJU, DOB 1961/03/04, MRN [id number]  Primary Cardiologist: None Primary Heart Failure: None Electrophysiologist: Will Gladis Norton, MD   {Click to update primary MD,subspecialty MD or APP then REFRESH:1}    History of Present Illness:   [name] is a 63 y.o. male with h/o SSS / Tachy-Brady s/p PPM, AF, NICM w/rec EF, hyperthyroidism seen today for routine electrophysiology followup.   Since last being seen in our clinic the patient reports doing ***.    He denies chest pain, palpitations, dyspnea, PND, orthopnea, nausea, vomiting, dizziness, syncope, edema, weight gain, or early satiety.   Review of systems complete and found to be negative unless listed in HPI.   EP Information / Studies Reviewed:    EKG is not ordered today. EKG from 02/10/24 reviewed which showed AP at 50 bpm      PPM Interrogation-  reviewed in detail today,  See PACEART report.  Device History: Medtronic Dual Chamber PPM implanted 06/22/08 for Sinus Node Dysfunction / Tachy-Brady  A Lead only, RV port plugged    Arrhythmia / AAD / Pertinent EP Studies AF  EPS 2012 > s/p PVI ablation EPS 2018 > s/p PVI / CTI ablation    Risk Assessment/Calculations:    CHA2DS2-VASc Score = 3  {Confirm score is correct.  If not, click here to update score.  REFRESH note.  :1} This indicates a 3.2% annual risk of stroke. The patient's score is based upon: CHF History: 1 HTN History: 1 Diabetes History: 0 Stroke History: 0 Vascular Disease History: 1 Age Score: 0 Gender Score: 0   {This patient has a significant risk of stroke if diagnosed with atrial fibrillation.  Please consider VKA or DOAC agent for anticoagulation if the bleeding risk is acceptable.   You can also use the SmartPhrase .[email or web address] for documentation.   :[id number]} No BP recorded.  {Refresh Note OR Click here to enter BP  :1}***        Physical Exam:   VS:   There were no vitals taken for this visit.   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 Norton {EPFOLLOW LE:71826}  Signed, Daphne Barrack, NP-C, AGACNP-BC [facility] - Electrophysiology  05/09/2024, 9:34 PM

## 2024-05-11 ENCOUNTER — Other Ambulatory Visit: Payer: Self-pay | Admitting: *Deleted

## 2024-05-11 ENCOUNTER — Ambulatory Visit: Attending: Pulmonary Disease | Admitting: Pulmonary Disease

## 2024-05-11 ENCOUNTER — Encounter: Payer: Self-pay | Admitting: Pulmonary Disease

## 2024-05-11 VITALS — BP 120/79 | HR 67 | Ht 71.0 in | Wt 274.0 lb

## 2024-05-11 DIAGNOSIS — Z950 Presence of cardiac pacemaker: Secondary | ICD-10-CM

## 2024-05-11 DIAGNOSIS — Z79899 Other long term (current) drug therapy: Secondary | ICD-10-CM | POA: Insufficient documentation

## 2024-05-11 DIAGNOSIS — I428 Other cardiomyopathies: Secondary | ICD-10-CM | POA: Insufficient documentation

## 2024-05-11 DIAGNOSIS — I495 Sick sinus syndrome: Secondary | ICD-10-CM | POA: Insufficient documentation

## 2024-05-11 DIAGNOSIS — I480 Paroxysmal atrial fibrillation: Secondary | ICD-10-CM

## 2024-05-11 LAB — CUP PACEART INCLINIC DEVICE CHECK
Date Time Interrogation Session: 20250911161322
Implantable Lead Connection Status: 753985
Implantable Lead Implant Date: 20091023
Implantable Lead Location: 753859
Implantable Lead Model: 5076
Implantable Pulse Generator Implant Date: 20091023

## 2024-05-11 NOTE — Patient Instructions (Addendum)
 Medication Instructions:  Your physician recommends that you continue on your current medications as directed. Please refer to the Current Medication list given to you today.  *If you need a refill on your cardiac medications before your next appointment, please call your pharmacy*  Lab Work: BMET, CBC-week of 10/13-17  If you have labs (blood work) drawn today and your tests are completely normal, you will receive your results only by: MyChart Message (if you have MyChart) OR A paper copy in the mail If you have any lab test that is abnormal or we need to change your treatment, we will call you to review the results.  Testing/Procedures: See letter  Follow-Up: At [facility], you and your health needs are our priority.  As part of our continuing mission to provide you with exceptional heart care, our providers are all part of one team.  This team includes your primary Cardiologist (physician) and Advanced Practice Providers or APPs (Physician Assistants and Nurse Practitioners) who all work together to provide you with the care you need, when you need it.  Your next appointment:   Follow up will be arranged for you and print out on your discharge summary after your procedure.

## 2024-05-13 NOTE — Progress Notes (Signed)
 Remote PPM Transmission

## 2024-05-16 ENCOUNTER — Ambulatory Visit: Payer: Self-pay | Admitting: Cardiology

## 2024-05-16 DIAGNOSIS — E7800 Pure hypercholesterolemia, unspecified: Secondary | ICD-10-CM | POA: Diagnosis not present

## 2024-05-16 DIAGNOSIS — I10 Essential (primary) hypertension: Secondary | ICD-10-CM | POA: Diagnosis not present

## 2024-05-16 DIAGNOSIS — G4730 Sleep apnea, unspecified: Secondary | ICD-10-CM | POA: Diagnosis not present

## 2024-05-16 DIAGNOSIS — Z6838 Body mass index (BMI) 38.0-38.9, adult: Secondary | ICD-10-CM | POA: Diagnosis not present

## 2024-05-16 DIAGNOSIS — I2510 Atherosclerotic heart disease of native coronary artery without angina pectoris: Secondary | ICD-10-CM | POA: Diagnosis not present

## 2024-05-16 DIAGNOSIS — R7303 Prediabetes: Secondary | ICD-10-CM | POA: Diagnosis not present

## 2024-05-16 DIAGNOSIS — I252 Old myocardial infarction: Secondary | ICD-10-CM | POA: Diagnosis not present

## 2024-05-16 DIAGNOSIS — I2699 Other pulmonary embolism without acute cor pulmonale: Secondary | ICD-10-CM | POA: Diagnosis not present

## 2024-05-16 DIAGNOSIS — I509 Heart failure, unspecified: Secondary | ICD-10-CM | POA: Diagnosis not present

## 2024-05-26 DIAGNOSIS — E669 Obesity, unspecified: Secondary | ICD-10-CM | POA: Diagnosis not present

## 2024-05-26 DIAGNOSIS — N528 Other male erectile dysfunction: Secondary | ICD-10-CM | POA: Diagnosis not present

## 2024-05-29 ENCOUNTER — Encounter: Payer: Self-pay | Admitting: Gastroenterology

## 2024-06-07 ENCOUNTER — Encounter (HOSPITAL_COMMUNITY): Payer: Self-pay

## 2024-06-07 ENCOUNTER — Telehealth (HOSPITAL_COMMUNITY): Payer: Self-pay

## 2024-06-07 NOTE — Telephone Encounter (Signed)
 Spoke with patient to complete pre-procedure call.     Health status review:  Any new medical conditions, recent signs of acute illness or been started on antibiotics? No Any recent hospitalizations or surgeries? No Any new medications started since pre-op  visit? No  Follow all medication instructions prior to procedure or the procedure may be rescheduled:    HOLD: Semaglutide (Ozempic, Rybelsus, Wegovy) for 1 week prior to the procedure. Last dose on or before Monday, October 20. Patient reports he has not taken in 2 months.  HOLD: Eliquis  (Apixaban ) for 2 day(s) prior to your procedure. Your last dose will be Saturday, October 25, PM dose. On the morning of your procedure, take all other morning medications not discussed with a sip of water.  The night before your procedure and the morning of your procedure, wash thoroughly with the CHG surgical soap from the neck down, paying special attention to the area where your procedure will be performed.  You may have a LIGHT breakfast prior to 6:30am on the morning of your procedure. Nothing to eat or drink after 6:30am except for sips of water with your medications.  Pre-procedure testing scheduled: lab work by October 14.  Confirmed patient is scheduled for PPM generator change on Tuesday, October 28 with Dr. Soyla Norton. Instructed patient to arrive at the Main Entrance A at [facility]: [address] [place], [place] [address] and check in at Admitting at 12:30 PM.  Advised of plan to go home the same day and will only stay overnight if medically necessary. You MUST have a responsible adult to drive you home and MUST be with you the first 24 hours after you arrive home or your procedure could be cancelled.  Informed patient a nurse will call a day before the procedure to confirm arrival time and ensure instructions are followed.  Patient verbalized understanding to information provided and is agreeable to proceed with procedure.    Advised patient to contact RN Navigator at [phone], to inform of any new medications started after call or concerns prior to procedure.

## 2024-06-13 ENCOUNTER — Other Ambulatory Visit: Payer: Self-pay | Admitting: Neurosurgery

## 2024-06-13 DIAGNOSIS — M48061 Spinal stenosis, lumbar region without neurogenic claudication: Secondary | ICD-10-CM

## 2024-06-20 NOTE — Telephone Encounter (Signed)
 Called patient regarding pre-procedure labs still needed. Left detailed VM for patient.

## 2024-06-21 LAB — BASIC METABOLIC PANEL WITH GFR
BUN/Creatinine Ratio: 14 (ref 10–24)
BUN: 14 mg/dL (ref 8–27)
CO2: 25 mmol/L (ref 20–29)
Calcium: 9.4 mg/dL (ref 8.6–10.2)
Chloride: 103 mmol/L (ref 96–106)
Creatinine, Ser: 0.98 mg/dL (ref 0.76–1.27)
Glucose: 89 mg/dL (ref 70–99)
Potassium: 4.5 mmol/L (ref 3.5–5.2)
Sodium: 141 mmol/L (ref 134–144)
eGFR: 86 mL/min/1.73 (ref >59)

## 2024-06-21 LAB — CBC
Hematocrit: 46 % (ref 37.5–51.0)
Hemoglobin: 14.6 g/dL (ref 13.0–17.7)
MCH: 27.1 pg (ref 26.6–33.0)
MCHC: 31.7 g/dL (ref 31.5–35.7)
MCV: 85 fL (ref 79–97)
Platelets: 259 x10E3/uL (ref 150–450)
RBC: 5.39 x10E6/uL (ref 4.14–5.80)
RDW: 16.1 % — ABNORMAL HIGH (ref 11.6–15.4)
WBC: 4.7 x10E3/uL (ref 3.4–10.8)

## 2024-06-26 ENCOUNTER — Telehealth: Payer: Self-pay | Admitting: Cardiology

## 2024-06-26 NOTE — Pre-Procedure Instructions (Addendum)
 Attempted to call patient reagrding procedure instructions for tomorrow.  Left voicemail on the following items: Arrival time 1130, new arrival time Nothing to eat or drink after midnight No meds AM of procedure Responsible person to drive you home and stay with you for 24 hrs Wash with special soap night before and morning of procedure If on anti-coagulant drug instructions Eliquis -last dose was suppost to be Saturday, sounds like you took a dose last night which was Sunday.  Continue to hold until after procedure.

## 2024-06-26 NOTE — Telephone Encounter (Signed)
 Patient stated he has a procedure scheduled for tomorrow (10/28) and forgot to stop taking his blood thinner medication.  Patient wants advice on next steps.

## 2024-06-26 NOTE — Telephone Encounter (Signed)
 S/w the patient and he reports that he was supposed stop taking the Eliquis  for his procedure tomorrow. He reports that he took it last night, but did not take it this morning.  Informed him that I would give this information to his provider, then give him a call back . He verbalized understanding.

## 2024-06-26 NOTE — Telephone Encounter (Addendum)
 Left pt detailed message that MD said ok to proceed tomorrow, hold Eliquis  until procedure.

## 2024-06-27 ENCOUNTER — Ambulatory Visit (HOSPITAL_COMMUNITY)
Admission: RE | Admit: 2024-06-27 | Discharge: 2024-06-27 | Disposition: A | Discharge status: Home / Self Care | Attending: Cardiology | Admitting: Cardiology

## 2024-06-27 ENCOUNTER — Encounter (HOSPITAL_COMMUNITY)
Admission: RE | Disposition: A | Discharge status: Home / Self Care | Payer: Self-pay | Source: Home / Self Care | Attending: Cardiology

## 2024-06-27 ENCOUNTER — Encounter (HOSPITAL_COMMUNITY): Payer: Self-pay | Admitting: Cardiology

## 2024-06-27 ENCOUNTER — Other Ambulatory Visit: Payer: Self-pay

## 2024-06-27 DIAGNOSIS — I480 Paroxysmal atrial fibrillation: Secondary | ICD-10-CM | POA: Insufficient documentation

## 2024-06-27 DIAGNOSIS — E0590 Thyrotoxicosis, unspecified without thyrotoxic crisis or storm: Secondary | ICD-10-CM | POA: Diagnosis not present

## 2024-06-27 DIAGNOSIS — I428 Other cardiomyopathies: Secondary | ICD-10-CM | POA: Insufficient documentation

## 2024-06-27 DIAGNOSIS — I495 Sick sinus syndrome: Secondary | ICD-10-CM | POA: Insufficient documentation

## 2024-06-27 DIAGNOSIS — Z45010 Encounter for checking and testing of cardiac pacemaker pulse generator [battery]: Secondary | ICD-10-CM | POA: Diagnosis not present

## 2024-06-27 DIAGNOSIS — M549 Dorsalgia, unspecified: Secondary | ICD-10-CM | POA: Diagnosis not present

## 2024-06-27 HISTORY — PX: PPM GENERATOR CHANGEOUT: EP1233

## 2024-06-27 SURGERY — PPM GENERATOR CHANGEOUT

## 2024-06-27 MED ORDER — ONDANSETRON HCL 4 MG/2ML IJ SOLN: 4.0000 mg | Freq: Four times a day (QID) | INTRAMUSCULAR | Status: DC | PRN

## 2024-06-27 MED ORDER — SODIUM CHLORIDE 0.9 % IV SOLN: INTRAVENOUS | Status: DC

## 2024-06-27 MED ORDER — SODIUM CHLORIDE 0.9 % IV SOLN
80.0000 mg | INTRAVENOUS | Status: AC
  Administered 2024-06-27: 80 mg

## 2024-06-27 MED ORDER — LIDOCAINE HCL (PF) 1 % IJ SOLN
INTRAMUSCULAR | Status: DC | PRN
  Administered 2024-06-27: 40 mL

## 2024-06-27 MED ORDER — HYDRALAZINE HCL 20 MG/ML IJ SOLN
INTRAMUSCULAR | Status: AC
  Filled 2024-06-27: qty 1

## 2024-06-27 MED ORDER — CEFAZOLIN SODIUM-DEXTROSE 3-4 GM/150ML-% IV SOLN
3.0000 g | INTRAVENOUS | Status: AC
  Administered 2024-06-27: 3 g via INTRAVENOUS
  Filled 2024-06-27: qty 150

## 2024-06-27 MED ORDER — HYDRALAZINE HCL 20 MG/ML IJ SOLN
INTRAMUSCULAR | Status: DC | PRN
  Administered 2024-06-27: 10 mg via INTRAVENOUS

## 2024-06-27 MED ORDER — GENTAMICIN SULFATE 40 MG/ML IJ SOLN
INTRAMUSCULAR | Status: AC
  Filled 2024-06-27: qty 2

## 2024-06-27 MED ORDER — CHLORHEXIDINE GLUCONATE 4 % EX SOLN
4.0000 | Freq: Once | CUTANEOUS | Status: DC
  Filled 2024-06-27: qty 60

## 2024-06-27 MED ORDER — ACETAMINOPHEN 325 MG PO TABS: 325.0000 mg | ORAL_TABLET | ORAL | Status: DC | PRN

## 2024-06-27 MED ORDER — POVIDONE-IODINE 10 % EX SWAB: 2.0000 | Freq: Once | CUTANEOUS | Status: DC

## 2024-06-27 SURGICAL SUPPLY — 5 items
CABLE SURGICAL S-101-97-12 (CABLE) ×1 IMPLANT
ELECT DEFIB PAD ADLT CADENCE (PAD) IMPLANT
IPG PACE AZUR XT SR MRI W1SR01 (Pacemaker) IMPLANT
POUCH AIGIS-R ANTIBACT PPM MED (Mesh General) IMPLANT
TRAY PACEMAKER INSERTION (PACKS) ×1 IMPLANT

## 2024-06-27 NOTE — Discharge Instructions (Signed)

## 2024-06-27 NOTE — Progress Notes (Signed)
 Patient was given discharge instructions. He verbalized understading.

## 2024-06-27 NOTE — H&P (Signed)
  Electrophysiology Office Note:   ID:  [name], DOB 12/05/1960, MRN [id number]  Primary Cardiologist: None Electrophysiologist: Nyana Haren Gladis Norton, MD      History of Present Illness:   [name] is a 63 y.o. male with h/o SSS s/p PPM, hyperthyroidism, NICM with recovered EF, and PAF seen today for cardiac clearance for lumbar epidural   Since last being seen in our clinic the patient reports doing well. Has back pain and is pending epidural. No breakthrough AF. Otherwise, he denies chest pain, palpitations, dyspnea, PND, orthopnea, nausea, vomiting, dizziness, syncope, edema, weight gain, or early satiety.  Review of systems complete and found to be negative unless listed in HPI.   EP Information / Studies Reviewed:    EKG is ordered today. Personal review as below.       PPM Interrogation-  reviewed in detail today,  See PACEART report.  Arrhythmia/Device History MDT dual chamber PPM implanted 2009, A lead only, RV port is plugged  S/p PVI 2012 S/p PVI/CTI 2018   Physical Exam:   VS:  BP (!) 168/93   Pulse (!) 44   Temp 97.6 F (36.4 C) (Oral)   Resp 18   Ht 5' 11 (1.803 m)   Wt 124.7 kg   SpO2 99%   BMI 38.35 kg/m    Wt Readings from Last 3 Encounters:  06/27/24 124.7 kg  05/11/24 124.3 kg  04/04/24 127.9 kg     GEN: No acute distress  NECK: No JVD; No carotid bruits CARDIAC: Regular rate and rhythm, no murmurs, rubs, gallops RESPIRATORY:  Clear to auscultation without rales, wheezing or rhonchi  ABDOMEN: Soft, non-tender, non-distended EXTREMITIES:  No edema; No deformity   ASSESSMENT AND PLAN:    Tachy-Brady syndrome s/p Medtronic PPM  [name] has presented today for surgery, with the diagnosis of tachy/brady syndrome.  The various methods of treatment have been discussed with the patient and family. After consideration of risks, benefits and other options for treatment, the patient has consented to  Procedure(s): Pacemaker  generator change as a surgical intervention .  Risks include but not limited to bleeding, infection, pneumothorax, perforation, tamponade, vascular damage, renal failure, MI, stroke, death, and lead dislodgement . The patient's history has been reviewed, patient examined, no change in status, stable for surgery.  I have reviewed the patient's chart and labs.  Questions were answered to the patient's satisfaction.    [name], MD 06/27/2024 1:18 PM

## 2024-06-27 NOTE — Discharge Instructions (Addendum)

## 2024-06-28 ENCOUNTER — Encounter: Payer: Self-pay | Admitting: Emergency Medicine

## 2024-06-30 ENCOUNTER — Inpatient Hospital Stay
Admission: RE | Admit: 2024-06-30 | Discharge: 2024-06-30 | Disposition: A | Discharge status: Home / Self Care | Source: Ambulatory Visit | Attending: Neurosurgery | Admitting: Neurosurgery

## 2024-07-03 ENCOUNTER — Telehealth: Payer: Self-pay | Admitting: Cardiology

## 2024-07-03 NOTE — Telephone Encounter (Signed)
  Patient is calling because he has questions about his pacemaker that he just got. He has questions about going out of town and he is going out of town quarry manager.

## 2024-07-04 NOTE — Telephone Encounter (Signed)
 Pt calling to ask if he has to take his remote monitor with him whenever he goes out of town.  Advised to leave his monitor at home.  No need to take his monitor with him.  Pt thanked nurse for call back.

## 2024-07-05 ENCOUNTER — Telehealth: Payer: Self-pay

## 2024-07-05 NOTE — Telephone Encounter (Signed)
 Follow-up after same day discharge: Implant date: 06/27/2024 MD: Soyla Norton, MD Device: PPM MDT  Location: L chest    Wound check visit: 07/11/2024 90 day MD follow-up: 09/27/2024  Remote Transmission received:yes  Dressing/sling removed: n/a  Confirm OAC restart on: unable to reach pt   Please continue to monitor your cardiac device site for redness, swelling, and drainage. Call the device clinic at [phone] if you experience these symptoms, fever/chills, or have questions about your device.   Remote monitoring is used to monitor your cardiac device from home. This monitoring is scheduled every 91 days by our office. It allows us  to keep an eye on the functioning of your device to ensure it is working properly.

## 2024-07-11 ENCOUNTER — Ambulatory Visit: Attending: Cardiology | Admitting: *Deleted

## 2024-07-11 DIAGNOSIS — I480 Paroxysmal atrial fibrillation: Secondary | ICD-10-CM | POA: Diagnosis present

## 2024-07-11 DIAGNOSIS — I495 Sick sinus syndrome: Secondary | ICD-10-CM | POA: Diagnosis present

## 2024-07-11 NOTE — Patient Instructions (Signed)

## 2024-07-12 LAB — CUP PACEART INCLINIC DEVICE CHECK
Date Time Interrogation Session: 20251111152140
Implantable Lead Connection Status: 753985
Implantable Lead Implant Date: 20091023
Implantable Lead Location: 753859
Implantable Lead Model: 5076
Implantable Pulse Generator Implant Date: 20251028

## 2024-07-12 NOTE — Progress Notes (Signed)
 Normal single chamber pacemaker wound check. Presenting rhythm: VS. Wound well healed. Routine testing performed. Thresholds, sensing, and impedance consistent with previous remote measurements. NSVT episode x1 lasted 2 seconds. Episode EGM c/w atrially driven rhythm. No arm restrictions since this was just a generator change out.  Pt enrolled in remote follow-up.  Output previously set to 2.0 V @ 0.4 mS and was not adjusted during this visit. Battery longevity: 15 yrs 8months.

## 2024-07-17 NOTE — Discharge Instructions (Signed)

## 2024-07-18 ENCOUNTER — Inpatient Hospital Stay
Admission: RE | Admit: 2024-07-18 | Discharge: 2024-07-18 | Disposition: A | Discharge status: Home / Self Care | Source: Ambulatory Visit | Attending: Neurosurgery | Admitting: Neurosurgery

## 2024-07-18 ENCOUNTER — Ambulatory Visit: Payer: Self-pay | Admitting: Cardiology

## 2024-07-20 ENCOUNTER — Ambulatory Visit: Admitting: Nurse Practitioner

## 2024-07-24 NOTE — Discharge Instructions (Signed)

## 2024-07-26 ENCOUNTER — Other Ambulatory Visit: Payer: Self-pay | Admitting: Neurosurgery

## 2024-07-26 ENCOUNTER — Ambulatory Visit
Admission: RE | Admit: 2024-07-26 | Discharge: 2024-07-26 | Disposition: A | Discharge status: Home / Self Care | Source: Ambulatory Visit | Attending: Neurosurgery | Admitting: Neurosurgery

## 2024-07-26 DIAGNOSIS — M48061 Spinal stenosis, lumbar region without neurogenic claudication: Secondary | ICD-10-CM

## 2024-07-26 MED ORDER — METHYLPREDNISOLONE ACETATE 40 MG/ML INJ SUSP (RADIOLOG
80.0000 mg | Freq: Once | INTRAMUSCULAR | Status: AC
  Administered 2024-07-26: 80 mg via EPIDURAL

## 2024-07-26 MED ORDER — IOPAMIDOL (ISOVUE-M 200) INJECTION 41%
1.0000 mL | Freq: Once | INTRAMUSCULAR | Status: AC
  Administered 2024-07-26: 1 mL via EPIDURAL

## 2024-08-02 ENCOUNTER — Ambulatory Visit

## 2024-08-02 DIAGNOSIS — I495 Sick sinus syndrome: Secondary | ICD-10-CM

## 2024-08-03 LAB — CUP PACEART REMOTE DEVICE CHECK
Battery Remaining Longevity: 189 mo
Battery Voltage: 3.22 V
Brady Statistic RV Percent Paced: 1.88 %
Date Time Interrogation Session: 20251202192335
Implantable Lead Connection Status: 753985
Implantable Lead Implant Date: 20091023
Implantable Lead Location: 753859
Implantable Lead Model: 5076
Implantable Pulse Generator Implant Date: 20251028
Lead Channel Impedance Value: 304 Ohm
Lead Channel Impedance Value: 399 Ohm
Lead Channel Pacing Threshold Amplitude: 0.625 V
Lead Channel Pacing Threshold Pulse Width: 0.4 ms
Lead Channel Sensing Intrinsic Amplitude: 1.375 mV
Lead Channel Sensing Intrinsic Amplitude: 1.375 mV
Lead Channel Setting Pacing Amplitude: 2 V
Lead Channel Setting Pacing Pulse Width: 0.4 ms
Lead Channel Setting Sensing Sensitivity: 0.45 mV
Zone Setting Status: 755011

## 2024-08-04 ENCOUNTER — Ambulatory Visit: Payer: Self-pay | Admitting: Cardiology

## 2024-08-08 ENCOUNTER — Encounter: Payer: Self-pay | Admitting: Cardiology

## 2024-08-08 ENCOUNTER — Ambulatory Visit: Attending: Cardiology | Admitting: Cardiology

## 2024-08-08 ENCOUNTER — Telehealth: Payer: Self-pay | Admitting: Cardiology

## 2024-08-08 VITALS — BP 124/94 | HR 67 | Ht 71.0 in | Wt 283.0 lb

## 2024-08-08 DIAGNOSIS — I10 Essential (primary) hypertension: Secondary | ICD-10-CM

## 2024-08-08 DIAGNOSIS — E785 Hyperlipidemia, unspecified: Secondary | ICD-10-CM | POA: Diagnosis present

## 2024-08-08 DIAGNOSIS — I428 Other cardiomyopathies: Secondary | ICD-10-CM

## 2024-08-08 DIAGNOSIS — I480 Paroxysmal atrial fibrillation: Secondary | ICD-10-CM

## 2024-08-08 DIAGNOSIS — R072 Precordial pain: Secondary | ICD-10-CM | POA: Diagnosis present

## 2024-08-08 MED ORDER — OLMESARTAN MEDOXOMIL 40 MG PO TABS: 40.0000 mg | ORAL_TABLET | Freq: Every day | ORAL | 3 refills | Status: AC

## 2024-08-08 NOTE — Telephone Encounter (Signed)
 Patient reports episode of chest pain occurred yesterday, lasting about 30 minutes. He states he lost his balance and felt woozy. Described as painful and tight, similar to his previous heart attack. He sat down and after about 15-20 minutes he drank some cold water and symptoms started to ease up.  Denies any SOB, denies any other episodes of chest pain since yesterday. He states he was not doing anything prior to episode, I was just standing there.  Patient states he recently had a gen change for his pacemaker (October 2025).  Offered patient appt with DOD for today, patient accepted and is scheduled to see Dr. Pietro today at 1:40 PM.  Advised patient on ED precautions and to go to straight to ED is chest pain returns at any point prior to appt today. Patient verbalized understanding.  Will forward to Dr. Pietro, his nurse and the device team.

## 2024-08-08 NOTE — Progress Notes (Signed)
 HPI: Follow-up history of pacemaker, atrial fibrillation, nonischemic cardiomyopathy and chest pain.  Patient is followed by Dr. Inocencio.  Cardiac catheterization September 2017 showed 25% LAD, 30% circumflex, 30% RCA and 60% acute marginal; ejection fraction 45 to 50%.  Had atrial fibrillation and atrial flutter ablation September 2018.  Most recent echocardiogram March 2022 showed normal LV function, grade 1 diastolic dysfunction.  Patient had generator replacement September 2025.  Notified the office today of chest pain and added to my schedule.  Patient states that yesterday at approximately noon he had substernal chest pain described as a sharp pain without radiation lasting 30 minutes.  No associated symptoms.  Not pleuritic or positional.  Resolved with drinking water.  He felt it was indigestion but his girlfriend felt he needed evaluation.  He denies exertional chest pain.  No dyspnea, palpitations or syncope.  No bleeding.  Current Outpatient Medications  Medication Sig Dispense Refill   atorvastatin  (LIPITOR) 40 MG tablet Take 40 mg by mouth daily.     carvedilol  (COREG ) 25 MG tablet Take 1 tablet (25 mg total) by mouth 2 (two) times daily with a meal. 180 tablet 2   Cholecalciferol  1.25 MG (50000 UT) capsule Take 50,000 Units by mouth daily.     cyclobenzaprine  (FLEXERIL ) 10 MG tablet Take 1 tablet (10 mg total) by mouth 3 (three) times daily as needed for muscle spasms. 30 tablet 0   desonide  (DESOWEN ) 0.05 % cream Apply 1 application topically 2 (two) times daily as needed (for rash).      dexlansoprazole  (DEXILANT ) 60 MG capsule Take 1 capsule (60 mg total) by mouth daily. Please keep your April 3rd appointment for further refills. Thank you 30 capsule 11   diclofenac Sodium (VOLTAREN) 1 % GEL Apply 4 g topically 4 (four) times daily.     dofetilide  (TIKOSYN ) 500 MCG capsule TAKE 1 CAPSULE BY MOUTH TWICE A DAY 180 capsule 2   ELIQUIS  5 MG TABS tablet TAKE 1 TABLET BY MOUTH TWICE A  DAY 180 tablet 1   fluticasone  (FLONASE ) 50 MCG/ACT nasal spray Place 1 spray into both nostrils daily.     gabapentin  (NEURONTIN ) 100 MG capsule Take 1-2 capsules (100-200 mg total) by mouth See admin instructions. Patient takes 1 tablet in the morning and 2 tablets at night (Patient taking differently: Take 600 mg by mouth at bedtime.) 60 capsule 3   ipratropium (ATROVENT ) 0.06 % nasal spray Place 1 spray into both nostrils daily.     methimazole  (TAPAZOLE ) 5 MG tablet Take 5 mg by mouth daily.     olmesartan  (BENICAR ) 20 MG tablet Take 20 mg by mouth daily.     oxyCODONE -acetaminophen  (PERCOCET) 10-325 MG tablet Take 1 tablet by mouth 4 (four) times daily as needed for pain.     triamcinolone  cream (KENALOG) 0.1 % Apply 1 Application topically 2 (two) times daily. For rash alternate with ointment     triamcinolone  ointment (KENALOG) 0.1 % Apply 1 Application topically 2 (two) times daily. Rash alternate with cream     doxycycline  (ADOXA) 100 MG tablet Take 100 mg by mouth 2 (two) times daily.     loratadine  (CLARITIN ) 10 MG tablet Take 1 tablet (10 mg total) by mouth daily. 30 tablet PRN   MELATONIN PO Take 36 mg by mouth at bedtime. 12 mg each     No current facility-administered medications for this visit.     Past Medical History:  Diagnosis Date   Atrial fibrillation (HCC)  s/p afib ablation  PVI 5/12 JA   CAD -nonobstructive    S/P nstemi-type II cath 05/2008 - nonobs. dzs.   Cataract    left eye 2019   Colon polyps    Complication of anesthesia    last 2 surgeries pt had some respiratory issues following surgery required ED visit, dx with bronchitis.    DDD (degenerative disc disease)    evaluate by neurosurgery is ongoing   Depression    ptsd   Duodenal adenoma    Erectile dysfunction    GERD (gastroesophageal reflux disease)    Headache(784.0)    Hemorrhoids    Hiatal hernia    HTN (hypertension)    Hyperlipemia    Hyperthyroidism    Graves dz on methimazole  and  followed by Dr Kassie   Myocardial infarction [facility])    2009   Obesity    Pancreatitis    Polysubstance abuse (HCC)    cocaine, last used 1999   Presence of permanent cardiac pacemaker    since 2009   PUD (peptic ulcer disease)    gastritis due to ETOH previously   Pulmonary embolism (HCC) 04/2003   Hx of   Rectal bleeding    Sinus node dysfunction (HCC)    s/p MDT PPM - AAI - V port plugged   Sleep apnea    uses CPAP [facility] 2 YRS   Systolic and diastolic CHF, chronic (HCC)    EF 35% by most recent echo 09/19/10   Tooth absence 2014   recent ly had tooth pulled painful    Past Surgical History:  Procedure Laterality Date   ANTERIOR CERVICAL DECOMP/DISCECTOMY FUSION  12/15/2011   Procedure: ANTERIOR CERVICAL DECOMPRESSION/DISCECTOMY FUSION 1 LEVEL;  Surgeon: Victory DELENA Gunnels, MD;  Location: [facility] NEURO ORS;  Service: Neurosurgery;  Laterality: N/A;  Cervical Six-Seven Anterior Cervical Diskectomy fusion with Allograft and Plating   ANTERIOR CERVICAL DECOMP/DISCECTOMY FUSION N/A 01/01/2020   Procedure: Anterior Cervical Discectomy Fusion - Cervical Seven- Thoracic One;  Surgeon: Gunnels Victory, MD;  Location: [facility] OR;  Service: Neurosurgery;  Laterality: N/A;  Anterior Cervical Discectomy Fusion - Cervical Seven- Thoracic One   ATRIAL ABLATION SURGERY  12/30/2010   afib ablation by Dr Kelsie   ATRIAL FIBRILLATION ABLATION N/A 05/06/2017   Procedure: Atrial Fibrillation Ablation;  Surgeon: [name], MD;  Location: [facility] INVASIVE CV LAB;  Service: Cardiovascular;  Laterality: N/A;   BACK SURGERY     CARDIAC CATHETERIZATION     CADIAC ABLATION DR KELSIE 12/2010   CARDIAC CATHETERIZATION N/A 05/08/2016   Procedure: Left Heart Cath and Coronary Angiography;  Surgeon: Peter M Jordan, MD;  Location: [facility] INVASIVE CV LAB;  Service: Cardiovascular;  Laterality: N/A;   Fingers removed from right hand.  10/01/1982   traumatic work injury   INSERT / REPLACE / REMOVE PACEMAKER     05/2008      KNEE ARTHROSCOPY Left 06/03/2021   LUMBAR LAMINECTOMY/DECOMPRESSION MICRODISCECTOMY Left 04/04/2024   Procedure: LEFT LUMBAR FIVE-SACRAL ONE LUMBAR LAMINECTOMY/DECOMPRESSION MICRODISCECTOMY;  Surgeon: Gunnels Victory, MD;  Location: [facility] OR;  Service: Neurosurgery;  Laterality: Left;  Laminectomy and Foraminotomy - left - L5-S1   LUMBAR SPINE SURGERY  02/28/2009   Dr. Malcolm   PACEMAKER INSERTION  05/31/2008   by Dr Fernande   [facility] GENERATOR CHANGEOUT N/A 06/27/2024   Procedure: PPM GENERATOR HERMA;  Surgeon: Inocencio Soyla Lunger, MD;  Location: [facility] INVASIVE CV LAB;  Service: Cardiovascular;  Laterality: N/A;   TEE WITHOUT CARDIOVERSION  N/A 05/05/2017   Procedure: TRANSESOPHAGEAL ECHOCARDIOGRAM (TEE);  Surgeon: Rolan Ezra RAMAN, MD;  Location: [facility] ENDOSCOPY;  Service: Cardiovascular;  Laterality: N/A;    Social History   Socioeconomic History   Marital status: Divorced    Spouse name: Not on file   Number of children: 7   Years of education: Not on file   Highest education level: Not on file  Occupational History   Occupation: unemployed    Employer: UNEMPLOYED  Tobacco Use   Smoking status: Former    Current packs/day: 0.00    Average packs/day: 1 pack/day for 15.0 years (15.0 ttl pk-yrs)    Types: Cigarettes    Start date: 04/03/1988    Quit date: 04/04/2003    Years since quitting: 21.3   Smokeless tobacco: Never  Vaping Use   Vaping status: Never Used  Substance and Sexual Activity   Alcohol use: Yes    Comment: occasionally   Drug use: No    Comment: previously used crack cocaine, quit 1999,  + marijuana 1999   Sexual activity: Yes  Other Topics Concern   Not on file  Social History Narrative   Unemployed currently.  Previously worked as a curator.  Lives in [place].   Social Drivers of Corporate Investment Banker Strain: Not on file  Food Insecurity: No Food Insecurity (09/18/2021)   [facility]   Hunger Vital Sign    Within the past 12 months, you  worried that your food would run out before you got the money to buy more.: Never true    Within the past 12 months, the food you bought just didn't last and you didn't have money to get more.: Never true  Transportation Needs: Not on file  Physical Activity: Not on file  Stress: No Stress Concern Present (06/10/2021)   Received from [facility] - Occupational Stress Questionnaire    Feeling of Stress : Not at all  Social Connections: Not on file  Intimate Partner Violence: Not At Risk (08/14/2023)   Received from [facility]   HITS    Over the last 12 months how often did your partner physically hurt you?: Never    Over the last 12 months how often did your partner insult you or talk down to you?: Never    Over the last 12 months how often did your partner threaten you with physical harm?: Never    Over the last 12 months how often did your partner scream or curse at you?: Never    Family History  Problem Relation Age of Onset   Heart disease Mother    Breast cancer Mother    Lung cancer Father        non-smoker   Diabetes Other    Colon cancer Neg Hx    Esophageal cancer Neg Hx    Rectal cancer Neg Hx    Stomach cancer Neg Hx     ROS: no fevers or chills, productive cough, hemoptysis, dysphasia, odynophagia, melena, hematochezia, dysuria, hematuria, rash, seizure activity, orthopnea, PND, pedal edema, claudication. Remaining systems are negative.  Physical Exam: Well-developed well-nourished in no acute distress.  Skin is warm and dry.  HEENT is normal.  Neck is supple.  Chest is clear to auscultation with normal expansion.  Cardiovascular exam is regular rate and rhythm.  Abdominal exam nontender or distended. No masses palpated. Extremities show no edema. neuro grossly intact  EKG Interpretation Date/Time:  Tuesday  August 08 2024 13:30:50 EST Ventricular Rate:  67 PR Interval:  166 QRS Duration:  122 QT  Interval:  440 QTC Calculation: 464 R Axis:   -54  Text Interpretation: Normal sinus rhythm Left anterior fascicular block Left ventricular hypertrophy with QRS widening ( R in aVL , Cornell product ) Confirmed by Pietro Rogue ([id number]) on 08/08/2024 1:32:34 PM    A/P  1 chest pain-symptoms are atypical and patient feels they could be indigestion.  Electrocardiogram shows no ST changes.  Will arrange coronary CTA to rule out obstructive coronary disease.  2 history of paroxysmal atrial fibrillation-status post ablation.  Continue Tikosyn , carvedilol  and apixaban .  Patient remains in sinus rhythm.  3 status post pacemaker-followed by electrophysiology.  4 hyperlipidemia-continue statin.  5 hypertension-blood pressure mildly elevated; increase Benicar  to 40 mg daily and follow.  Check potassium and renal function in 1 week.  6 history of nonischemic cardiomyopathy-ejection fraction improved on most recent echocardiogram.  Continue carvedilol  and ARB.  Repeat echocardiogram.  7 coronary artery disease-mild on previous catheterization.  Continue statin.  Rogue Pietro, MD

## 2024-08-08 NOTE — Progress Notes (Signed)
 Remote PPM Transmission

## 2024-08-08 NOTE — Patient Instructions (Signed)
 Medication Instructions:   INCREASE OLMESARTAN  TO 40 MG ONCE DAILY= 2 OF THE 20 MG TABLETS ONCE DAILY  *If you need a refill on your cardiac medications before your next appointment, please call your pharmacy*  Lab Work:  Your physician recommends that you return for lab work in: ONE WEEK-DO NOT NEED TO FAST  If you have labs (blood work) drawn today and your tests are completely normal, you will receive your results only by: MyChart Message (if you have MyChart) OR A paper copy in the mail If you have any lab test that is abnormal or we need to change your treatment, we will call you to review the results.  Testing/Procedures: Your physician has requested that you have an echocardiogram. Echocardiography is a painless test that uses sound waves to create images of your heart. It provides your doctor with information about the size and shape of your heart and how well your heart's chambers and valves are working. This procedure takes approximately one hour. There are no restrictions for this procedure. Please do NOT wear cologne, perfume, aftershave, or lotions (deodorant is allowed). Please arrive 15 minutes prior to your appointment time.  Please note: We ask at that you not bring children with you during ultrasound (echo/ vascular) testing. Due to room size and safety concerns, children are not allowed in the ultrasound rooms during exams. Our front office staff cannot provide observation of children in our lobby area while testing is being conducted. An adult accompanying a patient to their appointment will only be allowed in the ultrasound room at the discretion of the ultrasound technician under special circumstances. We apologize for any inconvenience. [address]     Your cardiac CT will be scheduled at   [facility] [address]  [place], [place] [address]   If scheduled at the Heart and Vascular Tower at [address], please enter the  parking lot using the [address] entrance and use the FREE valet service at the patient drop-off area. Enter the building and check-in with registration on the main floor.   Please follow these instructions carefully (unless otherwise directed):  An IV will be required for this test and Nitroglycerin  will be given.  Hold all erectile dysfunction medications at least 3 days (72 hrs) prior to test. (Ie viagra, cialis, sildenafil, tadalafil, etc)   On the Night Before the Test: Be sure to Drink plenty of water. Do not consume any caffeinated/decaffeinated beverages or chocolate 12 hours prior to your test. Do not take any antihistamines 12 hours prior to your test.  On the Day of the Test: Drink plenty of water until 1 hour prior to the test. Do not eat any food 1 hour prior to test. You may take your regular medications prior to the test.  TAKE CARVEDILOL  2 HOURS PRIOR TO CT SCAN      After the Test: Drink plenty of water. After receiving IV contrast, you may experience a mild flushed feeling. This is normal. On occasion, you may experience a mild rash up to 24 hours after the test. This is not dangerous. If this occurs, you can take Benadryl 25 mg, Zyrtec, Claritin , or Allegra and increase your fluid intake. (Patients taking Tikosyn  should avoid Benadryl, and may take Zyrtec, Claritin , or Allegra) If you experience trouble breathing, this can be serious. If it is severe call 911 IMMEDIATELY. If it is mild, please call our office.  We will call to schedule your test 2-4  weeks out understanding that some insurance companies will need an authorization prior to the service being performed.   For more information and frequently asked questions, please visit our website : [email or web address]  For non-scheduling related questions, please contact the cardiac imaging nurse navigator should you have any questions/concerns: Cardiac Imaging Nurse Navigators Direct Office Dial:  [phone]   For scheduling needs, including cancellations and rescheduling, please call Brittany, [phone].

## 2024-08-08 NOTE — Telephone Encounter (Signed)
  1. Are you having CP right now? No     2. Are you experiencing any other symptoms (ex. SOB, nausea, vomiting, sweating)? No    3. Is your CP continuous or coming and going? Coming and going    4. Have you taken Nitroglycerin ? No    5. How long have you been experiencing CP? Yesterday     6. If NO CP at time of call then end call with telling Pt to call back or call 911 if Chest pain returns prior to return call from triage team.  Pt states he had an episode of severe chest pain yesterday. He says it was so painful and tight it sat him down. Pt compares it to the pain he had when he had a heart attack previously. Pain lasted about thirty minutes until patient drank cold water and it eased up. No pain at time of call. Please advise.

## 2024-08-11 ENCOUNTER — Encounter (HOSPITAL_COMMUNITY): Payer: Self-pay

## 2024-08-15 ENCOUNTER — Ambulatory Visit: Payer: Self-pay | Admitting: Cardiology

## 2024-08-15 ENCOUNTER — Ambulatory Visit (HOSPITAL_COMMUNITY)
Admission: RE | Admit: 2024-08-15 | Discharge: 2024-08-15 | Disposition: A | Source: Ambulatory Visit | Attending: Cardiology | Admitting: Cardiology

## 2024-08-15 DIAGNOSIS — I77810 Thoracic aortic ectasia: Secondary | ICD-10-CM

## 2024-08-15 DIAGNOSIS — R072 Precordial pain: Secondary | ICD-10-CM | POA: Diagnosis present

## 2024-08-15 MED ORDER — IOHEXOL 350 MG/ML SOLN
100.0000 mL | Freq: Once | INTRAVENOUS | Status: AC | PRN
  Administered 2024-08-15: 13:00:00 100 mL via INTRAVENOUS

## 2024-08-15 MED ORDER — NITROGLYCERIN 0.4 MG SL SUBL
0.8000 mg | SUBLINGUAL_TABLET | Freq: Once | SUBLINGUAL | Status: AC
  Administered 2024-08-15: 13:00:00 0.8 mg via SUBLINGUAL

## 2024-08-15 NOTE — Progress Notes (Unsigned)
 Chief Complaint: Primary GI MD:Dr. Leigh  HPI: 63 year old male history of sinus node dysfunction and paroxysmal atrial arrhythmia s/p dual-chamber pacemaker, hypothyroidism, pulmonary embolism, nonobstructive CAD, on Eliquis  (follows with Dr. 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 And repeat EGD in 3 months  ----------------TODAY--------------  Discussed the use of AI scribe software for clinical note transcription with the patient, who gave verbal consent to proceed.  History of Present Illness   [name] is a 62 year old male who presents for follow-up endoscopy. He was referred by Dr. Leigh for follow-up on residual precancerous polyp tissue in the small intestine.  He underwent a previous endoscopy where biopsies of a duodenal adenoma, a precancerous polyp, were taken. The biopsy results indicated areas of precancerous tissue remaining, which was unexpected as nothing obvious was noted during the exam.  He is currently taking Eliquis . He recently saw Dr. Pietro.   PREVIOUS GI WORKUP   EGD 12/07/2023 for surveillance of duodenal adenoma - 2 cm hiatal hernia.  - Normal esophagus otherwise.  - Normal stomach.  - Duodenal scar in the 3rd portion - no overt residual polypoid tissue. Biopsied as outlined.  - Normal exam otherwise.     1. Surgical [P], small bowel biopsy :       - SMALL INTESTINAL ADENOMA (LOW-GRADE DYSPLASIA)       - NEGATIVE FOR HIGH-GRADE DYSPLASIA OR CARCINOMA   EGD 06/05/2022 for surveillance of duodenal adenomas removed 2019 - Esophagogastric landmarks identified.  - 2  cm hiatal hernia.  - Normal esophagus otherwise  - Normal stomach.  - A single duodenal polypoid lesion. Resected and retrieved.   Colonoscopy 06/05/2022 for personal history of advanced polyp in 2019 - One 4 to 5 mm polyp in the cecum, removed with a cold snare. Resected and retrieved.  - Two 3 mm polyps in the ascending colon, removed with a cold snare. Resected and retrieved.  - One 3 mm polyp in the descending colon, removed with a cold snare. Resected and retrieved.  - Two 2 to 3 mm polyps in the proximal rectum, removed with a cold snare. Resected and retrieved.  - Internal hemorrhoids.  - The examination was otherwise normal.   Diagnosis 1. Surgical [P], 2nd portion of duodenum TUBULAR ADENOMA NEGATIVE FOR HIGH-GRADE DYSPLASIA AND CARCINOMA 2. Surgical [P], colon, rectum, polyp (2) TUBULAR ADENOMA HYPERPLASTIC POLYP NEGATIVE FOR HIGH-GRADE DYSPLASIA AND CARCINOMA 3. Surgical [P], colon, descending, ascending and cecum, polyp (4) TUBULAR ADENOMA, 4 FRAGMENTS NEGATIVE FOR HIGH-GRADE DYSPLASIA AND CARCINOMA  Past Medical History:  Diagnosis Date   Atrial fibrillation [facility])    s/p afib ablation  PVI 5/12 JA   CAD -nonobstructive    S/P nstemi-type II cath 05/2008 - nonobs. dzs.   Cataract    left eye 2019   Colon polyps    Complication of anesthesia    last 2 surgeries pt had some respiratory issues following surgery required ED visit, dx with bronchitis.    DDD (degenerative disc disease)    evaluate by neurosurgery is ongoing   Depression    ptsd   Duodenal adenoma  Erectile dysfunction    GERD (gastroesophageal reflux disease)    Headache(784.0)    Hemorrhoids    Hiatal hernia    HTN (hypertension)    Hyperlipemia    Hyperthyroidism    Graves dz on methimazole  and followed by Dr Kassie   Myocardial infarction [facility])    2009   Obesity    Pancreatitis    Polysubstance abuse (HCC)    cocaine, last used 1999   Presence of permanent cardiac pacemaker     since 2009   PUD (peptic ulcer disease)    gastritis due to ETOH previously   Pulmonary embolism (HCC) 04/2003   Hx of   Rectal bleeding    Sinus node dysfunction (HCC)    s/p MDT PPM - AAI - V port plugged   Sleep apnea    uses CPAP [facility] 2 YRS   Systolic and diastolic CHF, chronic (HCC)    EF 35% by most recent echo 09/19/10   Tooth absence 2014   recent ly had tooth pulled painful    Past Surgical History:  Procedure Laterality Date   ANTERIOR CERVICAL DECOMP/DISCECTOMY FUSION  12/15/2011   Procedure: ANTERIOR CERVICAL DECOMPRESSION/DISCECTOMY FUSION 1 LEVEL;  Surgeon: Victory DELENA Gunnels, MD;  Location: [facility] NEURO ORS;  Service: Neurosurgery;  Laterality: N/A;  Cervical Six-Seven Anterior Cervical Diskectomy fusion with Allograft and Plating   ANTERIOR CERVICAL DECOMP/DISCECTOMY FUSION N/A 01/01/2020   Procedure: Anterior Cervical Discectomy Fusion - Cervical Seven- Thoracic One;  Surgeon: Gunnels Victory, MD;  [facility];  Service: Neurosurgery;  Laterality: N/A;  Anterior Cervical Discectomy Fusion - Cervical Seven- Thoracic One   ATRIAL ABLATION SURGERY  12/30/2010   afib ablation by Dr Kelsie   ATRIAL FIBRILLATION ABLATION N/A 05/06/2017   Procedure: Atrial Fibrillation Ablation;  Surgeon: [name], MD;  Location: [facility] INVASIVE CV LAB;  Service: Cardiovascular;  Laterality: N/A;   BACK SURGERY     CARDIAC CATHETERIZATION     CADIAC ABLATION DR KELSIE 12/2010   CARDIAC CATHETERIZATION N/A 05/08/2016   Procedure: Left Heart Cath and Coronary Angiography;  Surgeon: Peter M Jordan, MD;  Location: [facility] INVASIVE CV LAB;  Service: Cardiovascular;  Laterality: N/A;   Fingers removed from right hand.  10/01/1982   traumatic work injury   INSERT / REPLACE / REMOVE PACEMAKER     05/2008     KNEE ARTHROSCOPY Left 06/03/2021   LUMBAR LAMINECTOMY/DECOMPRESSION MICRODISCECTOMY Left 04/04/2024   Procedure: LEFT LUMBAR FIVE-SACRAL ONE LUMBAR LAMINECTOMY/DECOMPRESSION MICRODISCECTOMY;   Surgeon: Gunnels Victory, MD;  Location: [facility] OR;  Service: Neurosurgery;  Laterality: Left;  Laminectomy and Foraminotomy - left - L5-S1   LUMBAR SPINE SURGERY  02/28/2009   Dr. Malcolm   PACEMAKER INSERTION  05/31/2008   by Dr Fernande   [facility] GENERATOR CHANGEOUT N/A 06/27/2024   Procedure: PPM GENERATOR HERMA;  Surgeon: Inocencio Soyla Lunger, MD;  Location: [facility] INVASIVE CV LAB;  Service: Cardiovascular;  Laterality: N/A;   TEE WITHOUT CARDIOVERSION N/A 05/05/2017   Procedure: TRANSESOPHAGEAL ECHOCARDIOGRAM (TEE);  Surgeon: Rolan Ezra RAMAN, MD;  Location: [facility] ENDOSCOPY;  Service: Cardiovascular;  Laterality: N/A;    Current Outpatient Medications  Medication Sig Dispense Refill   atorvastatin  (LIPITOR) 40 MG tablet Take 40 mg by mouth daily.     carvedilol  (COREG ) 25 MG tablet Take 1 tablet (25 mg total) by mouth 2 (two) times daily with a meal. 180 tablet 2   Cholecalciferol  1.25 MG (50000 UT) capsule Take 50,000 Units by mouth daily.  cyclobenzaprine  (FLEXERIL ) 10 MG tablet Take 1 tablet (10 mg total) by mouth 3 (three) times daily as needed for muscle spasms. 30 tablet 0   desonide  (DESOWEN ) 0.05 % cream Apply 1 application topically 2 (two) times daily as needed (for rash).      dexlansoprazole  (DEXILANT ) 60 MG capsule Take 1 capsule (60 mg total) by mouth daily. Please keep your April 3rd appointment for further refills. Thank you 30 capsule 11   diclofenac Sodium (VOLTAREN) 1 % GEL Apply 4 g topically 4 (four) times daily.     dofetilide  (TIKOSYN ) 500 MCG capsule TAKE 1 CAPSULE BY MOUTH TWICE A DAY 180 capsule 2   ELIQUIS  5 MG TABS tablet TAKE 1 TABLET BY MOUTH TWICE A DAY 180 tablet 1   fluticasone  (FLONASE ) 50 MCG/ACT nasal spray Place 1 spray into both nostrils daily.     gabapentin  (NEURONTIN ) 100 MG capsule Take 1-2 capsules (100-200 mg total) by mouth See admin instructions. Patient takes 1 tablet in the morning and 2 tablets at night (Patient taking differently: Take 600 mg by mouth at  bedtime.) 60 capsule 3   ipratropium (ATROVENT ) 0.06 % nasal spray Place 1 spray into both nostrils daily.     methimazole  (TAPAZOLE ) 5 MG tablet Take 5 mg by mouth daily.     olmesartan  (BENICAR ) 40 MG tablet Take 1 tablet (40 mg total) by mouth daily. 90 tablet 3   oxyCODONE -acetaminophen  (PERCOCET) 10-325 MG tablet Take 1 tablet by mouth 4 (four) times daily as needed for pain.     triamcinolone  cream (KENALOG) 0.1 % Apply 1 Application topically 2 (two) times daily. For rash alternate with ointment     triamcinolone  ointment (KENALOG) 0.1 % Apply 1 Application topically 2 (two) times daily. Rash alternate with cream     No current facility-administered medications for this visit.    Allergies as of 08/16/2024 - Review Complete 08/16/2024  Allergen Reaction Noted   Celecoxib Itching 12/03/2010   Hydrocodone  Itching 12/03/2010   Prednisolone Itching 12/03/2010   Potassium chloride  Other (See Comments) 03/09/2017    Family History  Problem Relation Age of Onset   Heart disease Mother    Breast cancer Mother    Lung cancer Father        non-smoker   Diabetes Other    Colon cancer Neg Hx    Esophageal cancer Neg Hx    Rectal cancer Neg Hx    Stomach cancer Neg Hx     Social History   Socioeconomic History   Marital status: Divorced    Spouse name: Not on file   Number of children: 7   Years of education: Not on file   Highest education level: Not on file  Occupational History   Occupation: unemployed    Employer: UNEMPLOYED  Tobacco Use   Smoking status: Former    Current packs/day: 0.00    Average packs/day: 1 pack/day for 15.0 years (15.0 ttl pk-yrs)    Types: Cigarettes    Start date: 04/03/1988    Quit date: 04/04/2003    Years since quitting: 21.3   Smokeless tobacco: Never  Vaping Use   Vaping status: Never Used  Substance and Sexual Activity   Alcohol use: Yes    Comment: occasionally   Drug use: No    Comment: previously used crack cocaine, quit 1999,  +  marijuana 1999   Sexual activity: Yes  Other Topics Concern   Not on file  Social History Narrative  Unemployed currently.  Previously worked as a curator.  Lives in [place].   Social Drivers of Health   Tobacco Use: Medium Risk (08/16/2024)   Patient History    Smoking Tobacco Use: Former    Smokeless Tobacco Use: Never    Passive Exposure: Not on Actuary Strain: Not on file  Food Insecurity: No Food Insecurity (09/18/2021)   Received from [facility]   Epic    Within the past 12 months, you worried that your food would run out before you got the money to buy more.: Never true    Within the past 12 months, the food you bought just didn't last and you didn't have money to get more.: Never true  Transportation Needs: Not on file  Physical Activity: Not on file  Stress: Not on file  Social Connections: Not on file  Intimate Partner Violence: Not At Risk (08/14/2023)   Received from [facility]   HITS    Over the last 12 months how often did your partner physically hurt you?: Never    Over the last 12 months how often did your partner insult you or talk down to you?: Never    Over the last 12 months how often did your partner threaten you with physical harm?: Never    Over the last 12 months how often did your partner scream or curse at you?: Never  Depression (PHQ2-9): Low Risk (02/25/2023)   Depression (PHQ2-9)    PHQ-2 Score: 0  Alcohol Screen: Not on file  Housing: Not on file  Utilities: Not on file  Health Literacy: Not on file    Review of Systems:    Constitutional: No weight loss, fever, chills, weakness or fatigue HEENT: Eyes: No change in vision               Ears, Nose, Throat:  No change in hearing or congestion Skin: No rash or itching Cardiovascular: No chest pain, chest pressure or palpitations   Respiratory: No SOB or cough Gastrointestinal: See HPI and otherwise negative Genitourinary: No dysuria or change in urinary  frequency Neurological: No headache, dizziness or syncope Musculoskeletal: No new muscle or joint pain Hematologic: No bleeding or bruising Psychiatric: No history of depression or anxiety    Physical Exam:  Vital signs: BP 136/77   Pulse 81   Ht 5' 11 (1.803 m)   Wt 273 lb 6 oz (124 kg)   BMI 38.13 kg/m   Constitutional: NAD, alert and cooperative Head:  Normocephalic and atraumatic. Eyes:   PEERL, EOMI. No icterus. Conjunctiva pink. Respiratory: Respirations even and unlabored. Lungs clear to auscultation bilaterally.   No wheezes, crackles, or rhonchi.  Cardiovascular:  Regular rate and rhythm. No peripheral edema, cyanosis or pallor.  Gastrointestinal:  Soft, nondistended, nontender. No rebound or guarding. Normal bowel sounds. No appreciable masses or hepatomegaly. Rectal:  Declines Msk:  Symmetrical without gross deformities. Without edema, no deformity or joint abnormality.  Neurologic:  Alert and  oriented x4;  grossly normal neurologically.  Skin:   Dry and intact without significant lesions or rashes. Psychiatric: Oriented to person, place and time. Demonstrates good judgement and reason without abnormal affect or behaviors.  Physical Exam    RELEVANT LABS AND IMAGING: CBC    Component Value Date/Time   WBC 4.7 06/20/2024 1315   WBC 4.7 04/04/2024 1332   RBC 5.39 06/20/2024 1315   RBC 5.06 04/04/2024 1332   HGB 14.6 06/20/2024 1315   HCT 46.0 06/20/2024  1315   PLT 259 06/20/2024 1315   MCV 85 06/20/2024 1315   MCH 27.1 06/20/2024 1315   MCH 26.9 04/04/2024 1332   MCHC 31.7 06/20/2024 1315   MCHC 32.5 04/04/2024 1332   RDW 16.1 (H) 06/20/2024 1315   LYMPHSABS 2.6 01/26/2022 0120   LYMPHSABS 2.1 04/28/2017 1130   MONOABS 0.5 01/26/2022 0120   EOSABS 0.2 01/26/2022 0120   EOSABS 0.1 04/28/2017 1130   BASOSABS 0.0 01/26/2022 0120   BASOSABS 0.0 04/28/2017 1130    CMP     Component Value Date/Time   NA 139 08/15/2024 1258   K 4.7 08/15/2024 1258    CL 103 08/15/2024 1258   CO2 24 08/15/2024 1258   GLUCOSE 87 08/15/2024 1258   GLUCOSE 94 04/04/2024 1332   BUN 20 08/15/2024 1258   CREATININE 1.04 08/15/2024 1258   CREATININE 0.96 05/06/2016 0844   CALCIUM  9.4 08/15/2024 1258   PROT 6.3 (L) 04/04/2024 1332   PROT 6.5 12/26/2019 0950   ALBUMIN  3.6 04/04/2024 1332   ALBUMIN  4.2 12/26/2019 0950   AST 24 04/04/2024 1332   ALT 22 04/04/2024 1332   ALKPHOS 68 04/04/2024 1332   BILITOT 0.9 04/04/2024 1332   BILITOT 0.5 12/26/2019 0950   GFRNONAA >60 04/04/2024 1332   GFRAA 83 09/19/2020 1144     Assessment/Plan:   GERD Chronic history of GERD well-controlled on Dexilant  60 Mg once daily with EGD 2025 showing 2 cm hiatal hernia, otherwise unrevealing.  Recent labs were normal - Dexilant  60 Mg once daily - PPI risks discussed - Educated patient on lifestyle modifications   Duodenal adenoma EGD 11/2023 with duodenal adenoma with low-grade dysplasia.  Needs another repeat endoscopy in 3 months (due 02/2024) -- repeat EGD -- I thoroughly discussed the procedure with the patient (at bedside) to include nature of the procedure, alternatives, benefits, and risks (including but not limited to bleeding, infection, perforation, anesthesia/cardiac pulmonary complications).  Patient verbalized understanding and gave verbal consent to proceed with procedure.   History of colon polyps Colonoscopy in 2023 with 5 adenomatous polyps.  And a recall of 3 years (07/2025) - Recall 07/2025   Nonobstructive CAD Pulmonary embolism Sinus node dysfunction with atrial arrhythmia s/p dual-chamber pacemaker Afib Follows with Dr. Pietro and is on Eliquis .  Previous endoscopic procedures without difficulty. - Will hold Eliquis  2 days prior to endoscopic procedures - will instruct when and how to resume after procedure. Benefits and risks of procedure explained including risks of bleeding, perforation, infection, missed lesions, reactions to medications and  possible need for hospitalization and surgery for complications. Additional rare but real risk of stroke or other vascular clotting events off Eliquis  also explained and need to seek urgent help if any signs of these problems occur. Will communicate by phone or EMR with patient's  prescribing provider to confirm that holding Eliquis  is reasonable in this case.        [name] [facility] 08/16/2024, 10:34 AM  Cc: [facility]

## 2024-08-16 ENCOUNTER — Ambulatory Visit: Admitting: Gastroenterology

## 2024-08-16 ENCOUNTER — Telehealth: Payer: Self-pay

## 2024-08-16 ENCOUNTER — Encounter: Payer: Self-pay | Admitting: Gastroenterology

## 2024-08-16 VITALS — BP 136/77 | HR 81 | Ht 71.0 in | Wt 273.4 lb

## 2024-08-16 DIAGNOSIS — Z860101 Personal history of adenomatous and serrated colon polyps: Secondary | ICD-10-CM

## 2024-08-16 DIAGNOSIS — Z860100 Personal history of colon polyps, unspecified: Secondary | ICD-10-CM

## 2024-08-16 DIAGNOSIS — D132 Benign neoplasm of duodenum: Secondary | ICD-10-CM

## 2024-08-16 DIAGNOSIS — I495 Sick sinus syndrome: Secondary | ICD-10-CM | POA: Diagnosis not present

## 2024-08-16 DIAGNOSIS — K219 Gastro-esophageal reflux disease without esophagitis: Secondary | ICD-10-CM

## 2024-08-16 DIAGNOSIS — I2699 Other pulmonary embolism without acute cor pulmonale: Secondary | ICD-10-CM | POA: Diagnosis not present

## 2024-08-16 DIAGNOSIS — I482 Chronic atrial fibrillation, unspecified: Secondary | ICD-10-CM

## 2024-08-16 LAB — BASIC METABOLIC PANEL WITH GFR
BUN/Creatinine Ratio: 19 (ref 10–24)
BUN: 20 mg/dL (ref 8–27)
CO2: 24 mmol/L (ref 20–29)
Calcium: 9.4 mg/dL (ref 8.6–10.2)
Chloride: 103 mmol/L (ref 96–106)
Creatinine, Ser: 1.04 mg/dL (ref 0.76–1.27)
Glucose: 87 mg/dL (ref 70–99)
Potassium: 4.7 mmol/L (ref 3.5–5.2)
Sodium: 139 mmol/L (ref 134–144)
eGFR: 80 mL/min/1.73 (ref >59)

## 2024-08-16 NOTE — Telephone Encounter (Signed)
 Okay, thanks, we will await his cardiac workup and then schedule once he is cleared and completed his workup with them.  Thanks

## 2024-08-16 NOTE — Patient Instructions (Signed)
 You have been scheduled for an endoscopy. Please follow written instructions given to you at your visit today.  If you use inhalers (even only as needed), please bring them with you on the day of your procedure.  You will be contaced by our office prior to your procedure for directions on holding your Eliquis .  If you do not hear from our office 1 week prior to your scheduled procedure, please call [phone] to discuss. Ask for PJ, CMA  _______________________________________________________  If your blood pressure at your visit was 140/90 or greater, please contact your primary care physician to follow up on this.  _______________________________________________________  If you are age 32 or older, your body mass index should be between 23-30. Your Body mass index is 38.13 kg/m. If this is out of the aforementioned range listed, please consider follow up with your Primary Care Provider.  If you are age 61 or younger, your body mass index should be between 19-25. Your Body mass index is 38.13 kg/m. If this is out of the aformentioned range listed, please consider follow up with your Primary Care Provider.   ________________________________________________________  The [facility] providers would like to encourage you to use MYCHART to communicate with providers for non-urgent requests or questions.  Due to long hold times on the telephone, sending your provider a message by [facility] may be a faster and more efficient way to get a response.  Please allow 48 business hours for a response.  Please remember that this is for non-urgent requests.  _______________________________________________________  [facility] is using a team-based approach to care.  Your team is made up of your doctor and two to three APPS. Our APPS (Nurse Practitioners and Physician Assistants) work with your physician to ensure care continuity for you. They are fully qualified to address your health concerns  and develop a treatment plan. They communicate directly with your gastroenterologist to care for you. Seeing the Advanced Practice Practitioners on your physician's team can help you by facilitating care more promptly, often allowing for earlier appointments, access to diagnostic testing, procedures, and other specialty referrals.    I appreciate the opportunity to care for you. Nestor Blower, PA ___________________________________________________________________________

## 2024-08-16 NOTE — Telephone Encounter (Signed)
 Spoke with pt, aware his cardiac workup will be completed prior to endoscopy, he reports it is scheduled for February.

## 2024-08-16 NOTE — Telephone Encounter (Signed)
 [facility] Pre-operative Risk Assessment     Request for surgical clearance:     Endoscopy Procedure  What type of surgery is being performed?     EGD  When is this surgery scheduled?     10/10/2024  What type of clearance is required ?   Pharmacy  Are there any medications that need to be held prior to surgery and how long? Eliquis , 2 days  Practice name and name of physician performing surgery?      [facility], Dr Elspeth Naval  What is your office phone and fax number?      Phone- [phone]  Fax- [phone]  Anesthesia type (None, local, MAC, general) ?       MAC   Please route your response to Mikahla Wisor, CMA (AAMA)

## 2024-08-16 NOTE — Telephone Encounter (Signed)
° °  Patient Name: [name]  DOB: Sep 24, 1960 MRN: [id number]  Primary Cardiologist: Redell Shallow, MD  Chart reviewed as part of pre-operative protocol coverage. Upon chart review, patient had cardiac CT yesterday which was abnormal and concerning for coronary artery disease - Dr. Shallow sent message to his nurse indicating patient needs appointment as he will likely require cath. Will route to Adrien Conquest to ensure she saw this message to help schedule. Adrien, please add note re: preop eval at that visit too so that the provider knows that we will need to follow for candidacy if/when to proceed with endoscopy. Will hold off routing to pharm team given expected workup above, will need to circle back once patient's cardiac status is stable. Will also route to GI to make them aware further cardiac workup pending.  Danitza Schoenfeldt N Honor Frison, PA-C 08/16/2024, 11:08 AM

## 2024-08-17 NOTE — Telephone Encounter (Signed)
 I have entered the EGD recall and cancelled his appointment for 10/10/2024 with Dr Elspeth Naval. I will let the team know that is working on his Eliquis  clearance.

## 2024-08-17 NOTE — Telephone Encounter (Signed)
 Patient's procedure was canceled secondary to positive coronary CTA. Will remove from pre-op  pool.    Barnie HERO. Dorthie Santini, DNP, NP-C  08/17/2024, 12:52 PM [facility] [address] Office [phone] Fax [phone]

## 2024-09-06 ENCOUNTER — Telehealth: Payer: Self-pay

## 2024-09-06 NOTE — Telephone Encounter (Signed)
 Called and spoke with patient,patient will bring in sd.machine to appointment,will send mcyhart message as double reminder

## 2024-09-07 ENCOUNTER — Encounter: Payer: Self-pay | Admitting: Pulmonary Disease

## 2024-09-07 ENCOUNTER — Ambulatory Visit (INDEPENDENT_AMBULATORY_CARE_PROVIDER_SITE_OTHER): Admitting: Pulmonary Disease

## 2024-09-07 ENCOUNTER — Ambulatory Visit

## 2024-09-07 ENCOUNTER — Encounter: Payer: Self-pay | Admitting: *Deleted

## 2024-09-07 VITALS — BP 150/94 | HR 64 | Temp 98.5°F | Ht 71.0 in | Wt 283.0 lb

## 2024-09-07 DIAGNOSIS — Z87891 Personal history of nicotine dependence: Secondary | ICD-10-CM | POA: Diagnosis not present

## 2024-09-07 DIAGNOSIS — G4733 Obstructive sleep apnea (adult) (pediatric): Secondary | ICD-10-CM

## 2024-09-07 DIAGNOSIS — R9389 Abnormal findings on diagnostic imaging of other specified body structures: Secondary | ICD-10-CM | POA: Diagnosis not present

## 2024-09-07 NOTE — H&P (View-Only) (Signed)
 "     OFFICE NOTE:    Date:  09/08/2024  ID:  [name], DOB [birth year], MRN [id number] PCP: [facility]  [facility] Providers Cardiologist:  Redell Shallow, MD Electrophysiologist:  Will Gladis Norton, MD        Coronary artery disease Hx of NSTEMI Type 2 LHC 05/2008: no obstructive CAD  LHC 05/08/2016: LAD proximal 25; LCx proximal 30; RCA mid 30, acute marginal 60; EF 45-50 CCTA 08/15/2024: CAC score 825 (97th percentile); obstructive CAD [LAD mid 1-24; OM1 proximal 50-69-FFR 0.79; RCA proximal 70-99, mid 50-69-FFR 0.84 after proximal RCA stenosis, 0.79 after mid RCA stenosis, 0.74 distal] Paroxsymal atrial fibrillation S/p PVI ablation  HFimpEF (heart failure with improved ejection fraction)  Non-ischemic cardiomyopathy  TTE 03/2016: EF 40-45 TEE 05/05/2017: EF 35-40 TTE 11/20/2020: EF 60-65, no RWMA, GR 1 DD, normal RVSF, trivial MR, ascending aorta 39 mm SSS s/p Pacemaker   Hx of pulmonary embolism  Hyperlipidemia  Hypertension  Graves' disease OSA  Depression GERD  Spinal stenosis History of pancreatitis Peptic ulcer disease        Discussed the use of AI scribe software for clinical note transcription with the patient, who gave verbal consent to proceed. History of Present Illness [name] is a 64 y.o. male for follow-up of CAD.  He was recently seen by Dr. Shallow 08/08/2024 for evaluation of chest pain. CCTA was obtained 08/15/2024 and suggested borderline hemodynamically significant stenosis in proximal OM1 and serial, hemodynamically significant, RCA stenoses.  Cardiac catheterization is recommended.  He reports no significant change in symptoms since the CT scan.  He has not recent chest pain, shortness of breath, or syncope. He recently visited his primary care physician and was initially told he had pneumonia based on a chest x-ray, but a subsequent x-ray by his pulmonologist showed no signs of pneumonia. He has been prescribed  Zepbound for weight loss but has not started it yet due to upcoming procedures.    ROS-See HPI    Studies Reviewed:  EKG Interpretation Date/Time:  Friday September 08 2024 11:51:05 EST Ventricular Rate:  59 PR Interval:  170 QRS Duration:  122 QT Interval:  448 QTC Calculation: 443 R Axis:   -42  Text Interpretation: Sinus bradycardia Left axis deviation Left ventricular hypertrophy with QRS widening ST & T wave abnormality, consider anterolateral ischemia T wave abnormality, consider inferior ischemia Similar to prior tracings Confirmed by Lelon Hamilton [phone]) on 09/08/2024 11:56:45 AM    Risk Assessment/Calculations: CHA2DS2-VASc Score = 3   This indicates a 3.2% annual risk of stroke. The patient's score is based upon: CHF History: 1 HTN History: 1 Diabetes History: 0 Stroke History: 0 Vascular Disease History: 1 Age Score: 0 Gender Score: 0           Physical Exam:  VS:  BP 130/70   Pulse (!) 59   Ht 5' 11 (1.803 m)   Wt 283 lb (128.4 kg)   SpO2 93%   BMI 39.47 kg/m        Wt Readings from Last 3 Encounters:  09/08/24 283 lb (128.4 kg)  09/07/24 283 lb (128.4 kg)  08/16/24 273 lb 6 oz (124 kg)    Constitutional:      Appearance: Healthy appearance. Not in distress.  Neck:     Vascular: JVD normal.  Pulmonary:     Breath sounds: Normal breath sounds. No wheezing. No rales.  Cardiovascular:     Normal rate. Regular rhythm.  Murmurs: There is no murmur.  Edema:    Peripheral edema absent.  Abdominal:     Palpations: Abdomen is soft.       Assessment and Plan:    Assessment & Plan Coronary artery disease involving native coronary artery of native heart with angina pectoris Recent coronary CTA suggested serial, hemodynamically significant stenoses in the RCA and borderline significant stenosis in the OM1. No significant chest symptoms since the CT scan.  - Arrange cardiac catheterization - Prescribe nitroglycerin , PRN, chest pain - Continue  Lipitor 40 mg daily, carvedilol  25 mg twice daily - Instructed patient not to take nitroglycerin  if Viagra was used in the last 48 hours - Order BMET and CBC today - Instructed to go to the emergency room if recurrent chest pain occurs prior to cardiac catheterization - Follow-up post cardiac catheterization PAF (paroxysmal atrial fibrillation) (HCC) He is status post PVI ablation and remains on dofetilide  for rhythm control.  Maintaining sinus rhythm. QTc ok on EKG today. - Continue dofetilide  500 mcg twice daily - Continue carvedilol  25 mg twice daily - Continue Eliquis  5 mg twice daily - Hold Eliquis  for 2 days prior to cardiac catheterization  - Continue follow up with electrophysiology as planned Heart failure with improved ejection fraction (HFimpEF) (HCC) Previously reduced EF of 35-40% improved to normal EF of 60-65% by echocardiogram in March 2022. Volume status stable, NYHA class II-IIb. - Continue carvedilol  25 mg twice daily - Continue olmesartan  40 mg daily Sinus node dysfunction (HCC) Pacemaker Status post pacemaker placement. - Continue follow up with electrophysiology as planned Essential hypertension Blood pressure controlled. - Continue carvedilol  25 mg twice daily - Continue olmesartan  40 mg daily      Informed Consent   Shared Decision Making/Informed Consent The risks [stroke (1 in 1000), death (1 in 1000), kidney failure [usually temporary] (1 in 500), bleeding (1 in 200), allergic reaction [possibly serious] (1 in 200)], benefits (diagnostic support and management of coronary artery disease) and alternatives of a cardiac catheterization were discussed in detail with [name] and he is willing to proceed.     Dispo:  Return for Post Procedure Follow Up w Dr. Pietro, or Glendia Ferrier, PA-C.  Signed, Glendia Ferrier, PA-C   "

## 2024-09-07 NOTE — Progress Notes (Signed)
 "              [name]    [id number]    February 11, 1961  Primary Care Physician:[facility]  Referring Physician: [facility], [facility] [address] [place],  [place] [address]  Chief complaint:   Follow-up for obstructive sleep apnea Called about abnormal chest x-ray  Discussed the use of AI scribe software for clinical note transcription with the patient, who gave verbal consent to proceed.  History of Present Illness [name] is a 64 year old male who presents with right-sided pneumonia noted on a recent chest x-ray. He was referred by his primary care physician for evaluation of a right-sided pneumonia noted on a chest x-ray.  He was told by his primary care doctor that his chest x-ray showed a touch of pneumonia on his right side. No symptoms such as shortness of breath or cough. He recalls sneezing on Saturday evening after attending a funeral. After his doctor's visit on Monday, he felt the need to lie down for the rest of the day but felt okay upon waking the next day.  He has a history of persistent phlegm buildup in his throat, which he attributes to an infusion procedure. He manages this symptom by taking a capful of Coricidin nightly, which helps with congestion and reduces phlegm.  He uses a CPAP machine for sleep apnea and reports no issues with the device. He typically goes to bed between 10 and 11 PM. He experiences variability in his sleep pattern, sometimes waking up in the middle of the night and staying awake for a couple of hours before returning to sleep. He usually wakes up around 4 AM, stays up until 5 or 6 AM, and then sleeps for a couple more hours. He reports feeling sleepy periodically during the day, though not consistently.  No ongoing symptoms suggesting an infectious process I reviewed his CT scan of his heart that he had done a few days ago showing no acute infiltrate, left lower lobe interlobar pleural thickening  Reformed  smoker Nonallergic upper airway cough History of paroxysmal atrial fibrillation, on chronic anticoagulation Degenerative disc disease for which she had back surgery   Outpatient Encounter Medications as of 09/07/2024  Medication Sig   atorvastatin  (LIPITOR) 40 MG tablet Take 40 mg by mouth daily.   carvedilol  (COREG ) 25 MG tablet Take 1 tablet (25 mg total) by mouth 2 (two) times daily with a meal.   Cholecalciferol  1.25 MG (50000 UT) capsule Take 50,000 Units by mouth daily.   desonide  (DESOWEN ) 0.05 % cream Apply 1 application topically 2 (two) times daily as needed (for rash).    dexlansoprazole  (DEXILANT ) 60 MG capsule Take 1 capsule (60 mg total) by mouth daily. Please keep your April 3rd appointment for further refills. Thank you   diclofenac Sodium (VOLTAREN) 1 % GEL Apply 4 g topically 4 (four) times daily.   dofetilide  (TIKOSYN ) 500 MCG capsule TAKE 1 CAPSULE BY MOUTH TWICE A DAY   ELIQUIS  5 MG TABS tablet TAKE 1 TABLET BY MOUTH TWICE A DAY   fluticasone  (FLONASE ) 50 MCG/ACT nasal spray Place 1 spray into both nostrils daily.   gabapentin  (NEURONTIN ) 100 MG capsule Take 1-2 capsules (100-200 mg total) by mouth See admin instructions. Patient takes 1 tablet in the morning and 2 tablets at night (Patient taking differently: Take 600 mg by mouth at bedtime.)   gabapentin  (NEURONTIN ) 300 MG capsule Take 600 mg by mouth at bedtime.  ipratropium (ATROVENT ) 0.06 % nasal spray Place 1 spray into both nostrils daily.   methimazole  (TAPAZOLE ) 5 MG tablet Take 5 mg by mouth daily.   olmesartan  (BENICAR ) 40 MG tablet Take 1 tablet (40 mg total) by mouth daily.   oxyCODONE -acetaminophen  (PERCOCET) 10-325 MG tablet Take 1 tablet by mouth 4 (four) times daily as needed for pain.   sildenafil (VIAGRA) 100 MG tablet Take 100 mg by mouth as needed.   triamcinolone  cream (KENALOG) 0.1 % Apply 1 Application topically 2 (two) times daily. For rash alternate with ointment   [DISCONTINUED] cyclobenzaprine   (FLEXERIL ) 10 MG tablet Take 1 tablet (10 mg total) by mouth 3 (three) times daily as needed for muscle spasms.   [DISCONTINUED] triamcinolone  ointment (KENALOG) 0.1 % Apply 1 Application topically 2 (two) times daily. Rash alternate with cream   No facility-administered encounter medications on file as of 09/07/2024.    Allergies as of 09/07/2024 - Review Complete 09/07/2024  Allergen Reaction Noted   Celecoxib Itching 12/03/2010   Hydrocodone  Itching 12/03/2010   Prednisolone Itching 12/03/2010   Potassium chloride  Other (See Comments) 03/09/2017    Past Medical History:  Diagnosis Date   Atrial fibrillation [facility])    s/p afib ablation  PVI 5/12 JA   CAD -nonobstructive    S/P nstemi-type II cath 05/2008 - nonobs. dzs.   Cataract    left eye 2019   Colon polyps    Complication of anesthesia    last 2 surgeries pt had some respiratory issues following surgery required ED visit, dx with bronchitis.    DDD (degenerative disc disease)    evaluate by neurosurgery is ongoing   Depression    ptsd   Duodenal adenoma    Erectile dysfunction    GERD (gastroesophageal reflux disease)    Headache(784.0)    Hemorrhoids    Hiatal hernia    HTN (hypertension)    Hyperlipemia    Hyperthyroidism    Graves dz on methimazole  and followed by Dr Kassie   Myocardial infarction [facility])    2009   Obesity    Pancreatitis    Polysubstance abuse (HCC)    cocaine, last used 1999   Presence of permanent cardiac pacemaker    since 2009   PUD (peptic ulcer disease)    gastritis due to ETOH previously   Pulmonary embolism (HCC) 04/2003   Hx of   Rectal bleeding    Sinus node dysfunction (HCC)    s/p MDT PPM - AAI - V port plugged   Sleep apnea    uses CPAP [facility] 2 YRS   Systolic and diastolic CHF, chronic (HCC)    EF 35% by most recent echo 09/19/10   Tooth absence 2014   recent ly had tooth pulled painful    Past Surgical History:  Procedure Laterality Date   ANTERIOR CERVICAL  DECOMP/DISCECTOMY FUSION  12/15/2011   Procedure: ANTERIOR CERVICAL DECOMPRESSION/DISCECTOMY FUSION 1 LEVEL;  Surgeon: Victory DELENA Gunnels, MD;  Location: [facility] NEURO ORS;  Service: Neurosurgery;  Laterality: N/A;  Cervical Six-Seven Anterior Cervical Diskectomy fusion with Allograft and Plating   ANTERIOR CERVICAL DECOMP/DISCECTOMY FUSION N/A 01/01/2020   Procedure: Anterior Cervical Discectomy Fusion - Cervical Seven- Thoracic One;  Surgeon: Gunnels Victory, MD;  Location: [facility] OR;  Service: Neurosurgery;  Laterality: N/A;  Anterior Cervical Discectomy Fusion - Cervical Seven- Thoracic One   ATRIAL ABLATION SURGERY  12/30/2010   afib ablation by Dr Kelsie   ATRIAL FIBRILLATION ABLATION N/A 05/06/2017  Procedure: Atrial Fibrillation Ablation;  Surgeon: [name], MD;  Location: [facility] INVASIVE CV LAB;  Service: Cardiovascular;  Laterality: N/A;   BACK SURGERY     CARDIAC CATHETERIZATION     CADIAC ABLATION DR KELSIE 12/2010   CARDIAC CATHETERIZATION N/A 05/08/2016   Procedure: Left Heart Cath and Coronary Angiography;  Surgeon: Peter M Jordan, MD;  Location: [facility] INVASIVE CV LAB;  Service: Cardiovascular;  Laterality: N/A;   Fingers removed from right hand.  10/01/1982   traumatic work injury   INSERT / REPLACE / REMOVE PACEMAKER     05/2008     KNEE ARTHROSCOPY Left 06/03/2021   LUMBAR LAMINECTOMY/DECOMPRESSION MICRODISCECTOMY Left 04/04/2024   Procedure: LEFT LUMBAR FIVE-SACRAL ONE LUMBAR LAMINECTOMY/DECOMPRESSION MICRODISCECTOMY;  Surgeon: Louis Shove, MD;  Location: [facility] OR;  Service: Neurosurgery;  Laterality: Left;  Laminectomy and Foraminotomy - left - L5-S1   LUMBAR SPINE SURGERY  02/28/2009   Dr. Malcolm   PACEMAKER INSERTION  05/31/2008   by Dr Fernande   [facility] GENERATOR CHANGEOUT N/A 06/27/2024   Procedure: PPM GENERATOR HERMA;  Surgeon: Inocencio Soyla Lunger, MD;  Location: [facility] INVASIVE CV LAB;  Service: Cardiovascular;  Laterality: N/A;   TEE WITHOUT CARDIOVERSION N/A 05/05/2017   Procedure:  TRANSESOPHAGEAL ECHOCARDIOGRAM (TEE);  Surgeon: Rolan Ezra RAMAN, MD;  Location: [facility] ENDOSCOPY;  Service: Cardiovascular;  Laterality: N/A;    Family History  Problem Relation Age of Onset   Heart disease Mother    Breast cancer Mother    Lung cancer Father        non-smoker   Diabetes Other    Colon cancer Neg Hx    Esophageal cancer Neg Hx    Rectal cancer Neg Hx    Stomach cancer Neg Hx     Social History   Socioeconomic History   Marital status: Divorced    Spouse name: Not on file   Number of children: 7   Years of education: Not on file   Highest education level: Not on file  Occupational History   Occupation: unemployed    Employer: UNEMPLOYED  Tobacco Use   Smoking status: Former    Current packs/day: 0.00    Average packs/day: 1 pack/day for 15.0 years (15.0 ttl pk-yrs)    Types: Cigarettes    Start date: 04/03/1988    Quit date: 04/04/2003    Years since quitting: 21.4    Passive exposure: Past   Smokeless tobacco: Never  Vaping Use   Vaping status: Never Used  Substance and Sexual Activity   Alcohol use: Yes    Comment: occasionally   Drug use: No    Comment: previously used crack cocaine, quit 1999,  + marijuana 1999   Sexual activity: Yes  Other Topics Concern   Not on file  Social History Narrative   Unemployed currently.  Previously worked as a curator.  Lives in [place].   Social Drivers of Health   Tobacco Use: Medium Risk (09/07/2024)   Patient History    Smoking Tobacco Use: Former    Smokeless Tobacco Use: Never    Passive Exposure: Past  Physicist, Medical Strain: Not on file  Food Insecurity: No Food Insecurity (09/18/2021)   Received from [facility]   Epic    Within the past 12 months, you worried that your food would run out before you got the money to buy more.: Never true    Within the past 12 months, the food you bought just didn't last and you didn't have money to  get more.: Never true  Transportation Needs: Not on file   Physical Activity: Not on file  Stress: Not on file  Social Connections: Not on file  Intimate Partner Violence: Not At Risk (08/14/2023)   Received from [facility]   HITS    Over the last 12 months how often did your partner physically hurt you?: Never    Over the last 12 months how often did your partner insult you or talk down to you?: Never    Over the last 12 months how often did your partner threaten you with physical harm?: Never    Over the last 12 months how often did your partner scream or curse at you?: Never  Depression (PHQ2-9): Low Risk (02/25/2023)   Depression (PHQ2-9)    PHQ-2 Score: 0  Alcohol Screen: Not on file  Housing: Not on file  Utilities: Not on file  Health Literacy: Not on file    Review of Systems  Respiratory:  Positive for apnea. Negative for cough and shortness of breath.   Psychiatric/Behavioral:  Positive for sleep disturbance.     Vitals:   09/07/24 1002  BP: (!) 150/94  Pulse: 64  Temp: 98.5 F (36.9 C)  SpO2: 95%     Physical Exam Constitutional:      Appearance: He is obese.  HENT:     Head: Normocephalic.     Nose: Nose normal.     Mouth/Throat:     Mouth: Mucous membranes are moist.  Eyes:     Pupils: Pupils are equal, round, and reactive to light.  Cardiovascular:     Rate and Rhythm: Normal rate and regular rhythm.     Heart sounds: No murmur heard.    No friction rub.  Pulmonary:     Effort: No respiratory distress.     Breath sounds: No stridor. No wheezing or rhonchi.  Musculoskeletal:        General: No swelling.     Cervical back: No rigidity or tenderness.  Skin:    Coloration: Skin is not jaundiced.  Neurological:     Mental Status: He is alert.  Psychiatric:        Mood and Affect: Mood normal.    Data Reviewed: Most recent cardiac CT reviewed with pleural thickening in the interlobar septa left lower lobe  No recent chest x-ray in the [facility] system  Reportedly chest x-ray at primary care doctor's  office showing haziness at the bases of the lungs suggesting pneumonia  Most recent sleep study Home sleep study 02/10/2023 shows severe obstructive sleep apnea with AHI of 39.8  Records of recent visit with Dr. Neysa reviewed  Assessment and Plan Assessment & Plan Evaluation of possible pneumonia Asymptomatic with no cough, shortness of breath, or other respiratory symptoms. Previous x-ray indicated possible pneumonia on the right side. Lungs are clear on auscultation. No recent x-rays in the current system. Decision to repeat x-ray to confirm findings and assess for any abnormalities. - Ordered chest x-ray to evaluate for pneumonia - Advised to call if symptoms develop for potential antibiotic prescription  Obstructive sleep apnea Managed with CPAP. Reports variable sleep patterns with occasional awakenings and daytime sleepiness. CPAP usage and effectiveness to be assessed with machine data download. No significant changes in CPAP settings or usage reported. - Will download CPAP machine data to assess usage and effectiveness - Continue current CPAP therapy - Advised to maintain consistent sleep schedule and monitor for changes in symptoms  Will contact DME company  to get a download from his machine  Appointment to maintain yearly follow-up   Orders Placed This Encounter  Procedures   DG Chest 2 View    Abnormal chest x-ray, concern for pneumonia    Standing Status:   Future    Expiration Date:   09/07/2025    Reason for Exam (SYMPTOM  OR DIAGNOSIS REQUIRED):   abn cxr    Preferred imaging location?:   Internal    I reviewed the chest x-ray, no acute findings suggesting pneumonia  I personally spent a total of 31 minutes in the care of the patient today including preparing to see the patient, getting/reviewing separately obtained history, performing a medically appropriate exam/evaluation, counseling and educating, referring and communicating with other health care professionals,  documenting clinical information in the EHR, and independently interpreting results.   Jennet Epley MD [facility] 09/07/2024, 10:17 AM  CC: [facility]   "

## 2024-09-07 NOTE — Assessment & Plan Note (Addendum)
 Recent coronary CTA suggested serial, hemodynamically significant stenoses in the RCA and borderline significant stenosis in the OM1. No significant chest symptoms since the CT scan.  - Arrange cardiac catheterization - Prescribe nitroglycerin , PRN, chest pain - Continue Lipitor 40 mg daily, carvedilol  25 mg twice daily - Instructed patient not to take nitroglycerin  if Viagra was used in the last 48 hours - Order BMET and CBC today - Instructed to go to the emergency room if recurrent chest pain occurs prior to cardiac catheterization - Follow-up post cardiac catheterization

## 2024-09-07 NOTE — Progress Notes (Signed)
 "     OFFICE NOTE:    Date:  09/08/2024  ID:  [name], DOB [birth year], MRN [id number] PCP: Center, [facility]  [facility] Providers Cardiologist:  Redell Shallow, MD Electrophysiologist:  Will Gladis Norton, MD        Coronary artery disease Hx of NSTEMI Type 2 LHC 05/2008: no obstructive CAD  LHC 05/08/2016: LAD proximal 25; LCx proximal 30; RCA mid 30, acute marginal 60; EF 45-50 CCTA 08/15/2024: CAC score 825 (97th percentile); obstructive CAD [LAD mid 1-24; OM1 proximal 50-69-FFR 0.79; RCA proximal 70-99, mid 50-69-FFR 0.84 after proximal RCA stenosis, 0.79 after mid RCA stenosis, 0.74 distal] Paroxsymal atrial fibrillation S/p PVI ablation  HFimpEF (heart failure with improved ejection fraction)  Non-ischemic cardiomyopathy  TTE 03/2016: EF 40-45 TEE 05/05/2017: EF 35-40 TTE 11/20/2020: EF 60-65, no RWMA, GR 1 DD, normal RVSF, trivial MR, ascending aorta 39 mm SSS s/p Pacemaker   Hx of pulmonary embolism  Hyperlipidemia  Hypertension  Graves' disease OSA  Depression GERD  Spinal stenosis History of pancreatitis Peptic ulcer disease        Discussed the use of AI scribe software for clinical note transcription with the patient, who gave verbal consent to proceed. History of Present Illness [name] is a 64 y.o. male for follow-up of CAD.  He was recently seen by Dr. Shallow 08/08/2024 for evaluation of chest pain. CCTA was obtained 08/15/2024 and suggested borderline hemodynamically significant stenosis in proximal OM1 and serial, hemodynamically significant, RCA stenoses.  Cardiac catheterization is recommended.  He reports no significant change in symptoms since the CT scan.  He has not recent chest pain, shortness of breath, or syncope. He recently visited his primary care physician and was initially told he had pneumonia based on a chest x-ray, but a subsequent x-ray by his pulmonologist showed no signs of pneumonia. He has been prescribed  Zepbound for weight loss but has not started it yet due to upcoming procedures.    ROS-See HPI    Studies Reviewed:  EKG Interpretation Date/Time:  Friday September 08 2024 11:51:05 EST Ventricular Rate:  59 PR Interval:  170 QRS Duration:  122 QT Interval:  448 QTC Calculation: 443 R Axis:   -42  Text Interpretation: Sinus bradycardia Left axis deviation Left ventricular hypertrophy with QRS widening ST & T wave abnormality, consider anterolateral ischemia T wave abnormality, consider inferior ischemia Similar to prior tracings Confirmed by Lelon Hamilton [phone]) on 09/08/2024 11:56:45 AM    Risk Assessment/Calculations: CHA2DS2-VASc Score = 3   This indicates a 3.2% annual risk of stroke. The patient's score is based upon: CHF History: 1 HTN History: 1 Diabetes History: 0 Stroke History: 0 Vascular Disease History: 1 Age Score: 0 Gender Score: 0           Physical Exam:  VS:  BP 130/70   Pulse (!) 59   Ht 5' 11 (1.803 m)   Wt 283 lb (128.4 kg)   SpO2 93%   BMI 39.47 kg/m        Wt Readings from Last 3 Encounters:  09/08/24 283 lb (128.4 kg)  09/07/24 283 lb (128.4 kg)  08/16/24 273 lb 6 oz (124 kg)    Constitutional:      Appearance: Healthy appearance. Not in distress.  Neck:     Vascular: JVD normal.  Pulmonary:     Breath sounds: Normal breath sounds. No wheezing. No rales.  Cardiovascular:     Normal rate. Regular rhythm.  Murmurs: There is no murmur.  Edema:    Peripheral edema absent.  Abdominal:     Palpations: Abdomen is soft.       Assessment and Plan:    Assessment & Plan Coronary artery disease involving native coronary artery of native heart with angina pectoris Recent coronary CTA suggested serial, hemodynamically significant stenoses in the RCA and borderline significant stenosis in the OM1. No significant chest symptoms since the CT scan.  - Arrange cardiac catheterization - Prescribe nitroglycerin , PRN, chest pain - Continue  Lipitor 40 mg daily, carvedilol  25 mg twice daily - Instructed patient not to take nitroglycerin  if Viagra was used in the last 48 hours - Order BMET and CBC today - Instructed to go to the emergency room if recurrent chest pain occurs prior to cardiac catheterization - Follow-up post cardiac catheterization PAF (paroxysmal atrial fibrillation) (HCC) He is status post PVI ablation and remains on dofetilide  for rhythm control.  Maintaining sinus rhythm. QTc ok on EKG today. - Continue dofetilide  500 mcg twice daily - Continue carvedilol  25 mg twice daily - Continue Eliquis  5 mg twice daily - Hold Eliquis  for 2 days prior to cardiac catheterization  - Continue follow up with electrophysiology as planned Heart failure with improved ejection fraction (HFimpEF) (HCC) Previously reduced EF of 35-40% improved to normal EF of 60-65% by echocardiogram in March 2022. Volume status stable, NYHA class II-IIb. - Continue carvedilol  25 mg twice daily - Continue olmesartan  40 mg daily Sinus node dysfunction (HCC) Pacemaker Status post pacemaker placement. - Continue follow up with electrophysiology as planned Essential hypertension Blood pressure controlled. - Continue carvedilol  25 mg twice daily - Continue olmesartan  40 mg daily      Informed Consent   Shared Decision Making/Informed Consent The risks [stroke (1 in 1000), death (1 in 1000), kidney failure [usually temporary] (1 in 500), bleeding (1 in 200), allergic reaction [possibly serious] (1 in 200)], benefits (diagnostic support and management of coronary artery disease) and alternatives of a cardiac catheterization were discussed in detail with [name] and he is willing to proceed.     Dispo:  Return for Post Procedure Follow Up w Dr. Pietro, or Glendia Ferrier, PA-C.  Signed, Glendia Ferrier, PA-C   "

## 2024-09-07 NOTE — Patient Instructions (Signed)
 We will repeat your chest x-ray today  Will send your chart message regarding x-ray findings  Continue using your CPAP on a nightly basis  Make sure you are getting about 7 to 8 hours of sleep every night  Graded activities as tolerated  Follow-up in about a year  Call us  with any significant concerns

## 2024-09-08 ENCOUNTER — Encounter: Payer: Self-pay | Admitting: Physician Assistant

## 2024-09-08 ENCOUNTER — Ambulatory Visit: Attending: Physician Assistant | Admitting: Physician Assistant

## 2024-09-08 VITALS — BP 130/70 | HR 59 | Ht 71.0 in | Wt 283.0 lb

## 2024-09-08 DIAGNOSIS — I5020 Unspecified systolic (congestive) heart failure: Secondary | ICD-10-CM | POA: Diagnosis present

## 2024-09-08 DIAGNOSIS — I10 Essential (primary) hypertension: Secondary | ICD-10-CM | POA: Insufficient documentation

## 2024-09-08 DIAGNOSIS — I25119 Atherosclerotic heart disease of native coronary artery with unspecified angina pectoris: Secondary | ICD-10-CM | POA: Insufficient documentation

## 2024-09-08 DIAGNOSIS — I480 Paroxysmal atrial fibrillation: Secondary | ICD-10-CM | POA: Insufficient documentation

## 2024-09-08 DIAGNOSIS — I495 Sick sinus syndrome: Secondary | ICD-10-CM | POA: Diagnosis present

## 2024-09-08 DIAGNOSIS — Z950 Presence of cardiac pacemaker: Secondary | ICD-10-CM | POA: Insufficient documentation

## 2024-09-08 LAB — BASIC METABOLIC PANEL WITH GFR
BUN/Creatinine Ratio: 17 (ref 10–24)
BUN: 17 mg/dL (ref 8–27)
CO2: 22 mmol/L (ref 20–29)
Calcium: 9.3 mg/dL (ref 8.6–10.2)
Chloride: 103 mmol/L (ref 96–106)
Creatinine, Ser: 0.98 mg/dL (ref 0.76–1.27)
Glucose: 89 mg/dL (ref 70–99)
Potassium: 4.3 mmol/L (ref 3.5–5.2)
Sodium: 139 mmol/L (ref 134–144)
eGFR: 86 mL/min/1.73

## 2024-09-08 LAB — CBC
Hematocrit: 42.5 % (ref 37.5–51.0)
Hemoglobin: 13.7 g/dL (ref 13.0–17.7)
MCH: 26.8 pg (ref 26.6–33.0)
MCHC: 32.2 g/dL (ref 31.5–35.7)
MCV: 83 fL (ref 79–97)
Platelets: 249 x10E3/uL (ref 150–450)
RBC: 5.12 x10E6/uL (ref 4.14–5.80)
RDW: 16.1 % — ABNORMAL HIGH (ref 11.6–15.4)
WBC: 4.5 x10E3/uL (ref 3.4–10.8)

## 2024-09-08 MED ORDER — NITROGLYCERIN 0.4 MG SL SUBL: 0.4000 mg | SUBLINGUAL_TABLET | SUBLINGUAL | 11 refills | Status: AC | PRN

## 2024-09-08 NOTE — Patient Instructions (Signed)
 Medication Instructions:  Take nitroglycerin  as needed for chest pain  Do not use nitroglycerin  on the days you take viagra   *If you need a refill on your cardiac medications before your next appointment, please call your pharmacy*   Lab Work: BMET, CBC If you have labs (blood work) drawn today and your tests are completely normal, you will receive your results only by: MyChart Message (if you have MyChart) OR A paper copy in the mail If you have any lab test that is abnormal or we need to change your treatment, we will call you to review the results.   Testing/Procedures:  [facility] A DEPT OF [place]. [facility] AT [address] A DEPT OF THE [place]. [facility] [address] [place] [address] Dept: [phone] Loc: [phone]  GRIFFYN KUCINSKI  09/08/2024  You are scheduled for a Cardiac Catheterization on Monday, January 19 with Dr. Peter Jordan.  1. Please arrive at the [facility] (Main Entrance A) at [facility]: [address] [place], [place] [address] at 7:00 AM (This time is 2 hour(s) before your procedure to ensure your preparation).   Free valet parking service is available. You will check in at ADMITTING. The support person will be asked to wait in the waiting room.  It is OK to have someone drop you off and come back when you are ready to be discharged.    Special note: Every effort is made to have your procedure done on time. Please understand that emergencies sometimes delay scheduled procedures.  2. Diet: Nothing to eat after midnight.   3. Hydration: You need to be well hydrated before your procedure. On January 19, you may drink approved liquids (see below) until 2 hours before the procedure, with 16 oz of water as your last intake.   List of approved liquids water, clear juice, clear tea, black coffee, fruit juices, non-citric and without pulp, carbonated beverages, Gatorade, Kool -Aid, plain Jello-O and  plain ice popsicles.  4. Labs: You will need to have blood drawn on Friday, January 9 at [facility] (1st Floor), [address], [place], [place] [address]. You do not need to be fasting.  5. Medication instructions in preparation for your procedure:   Contrast Allergy : No   Stop taking Eliquis  (Apixiban) on Friday, January 16.   6. Plan to go home the same day, you will only stay overnight if medically necessary.  7. Bring a current list of your medications and current insurance cards.  8. You MUST have a responsible person to drive you home.  9. Someone MUST be with you the first 24 hours after you arrive home or your discharge will be delayed.  10. Please wear clothes that are easy to get on and off and wear slip-on shoes.  Thank you for allowing us  to care for you!   -- [place] [facility]   Your next appointment:   2 week(s)   Provider:   Glendia Ferrier, PA-C           Follow-Up: At [facility], you and your health needs are our priority.  As part of our continuing mission to provide you with exceptional heart care, we have created designated Provider Care Teams.  These Care Teams include your primary Cardiologist (physician) and Advanced Practice Providers (APPs -  Physician Assistants and Nurse Practitioners) who all work together to  provide you with the care you need, when you need it. We recommend signing up for the patient portal called MyChart.  Sign up information is provided on this After Visit Summary.  MyChart is used to connect with patients for Virtual Visits (Telemedicine).  Patients are able to view lab/test results, encounter notes, upcoming appointments, etc.  Non-urgent messages can be sent to your provider as well.   To learn more about what you can do with MyChart, go to [email or web address].

## 2024-09-08 NOTE — Assessment & Plan Note (Addendum)
 Previously reduced EF of 35-40% improved to normal EF of 60-65% by echocardiogram in March 2022. Volume status stable, NYHA class II-IIb. - Continue carvedilol  25 mg twice daily - Continue olmesartan  40 mg daily

## 2024-09-08 NOTE — Assessment & Plan Note (Addendum)
 Blood pressure controlled. - Continue carvedilol  25 mg twice daily - Continue olmesartan  40 mg daily

## 2024-09-08 NOTE — Assessment & Plan Note (Addendum)
 Status post pacemaker placement. - Continue follow up with electrophysiology as planned

## 2024-09-08 NOTE — Assessment & Plan Note (Addendum)
 He is status post PVI ablation and remains on dofetilide  for rhythm control.  Maintaining sinus rhythm. QTc ok on EKG today. - Continue dofetilide  500 mcg twice daily - Continue carvedilol  25 mg twice daily - Continue Eliquis  5 mg twice daily - Hold Eliquis  for 2 days prior to cardiac catheterization  - Continue follow up with electrophysiology as planned

## 2024-09-10 ENCOUNTER — Ambulatory Visit: Payer: Self-pay | Admitting: Physician Assistant

## 2024-09-13 ENCOUNTER — Other Ambulatory Visit: Payer: Self-pay | Admitting: Neurosurgery

## 2024-09-13 DIAGNOSIS — M48061 Spinal stenosis, lumbar region without neurogenic claudication: Secondary | ICD-10-CM

## 2024-09-14 ENCOUNTER — Telehealth: Payer: Self-pay | Admitting: *Deleted

## 2024-09-14 NOTE — Telephone Encounter (Signed)
 Cardiac Catheterization scheduled at [facility] for: Monday September 18, 2024 9 AM Arrival time [facility] Main Entrance A at: 7 AM  Diet: -Nothing to eat after midnight.  Hydration: -May drink clear liquids until 2 hours before the procedure.  Approved liquids: Water, clear tea, black coffee, fruit juices-non-citric and without pulp,Gatorade, plain Jello/popsicles.   -Please drink 16 oz of water 2 hours before procedure.  Medication instructions: -Hold:  Eliquis -none 09/16/24 until post procedure -Other usual morning medications can be taken including aspirin  81 mg.  Plan to go home the same day, you will only stay overnight if medically necessary.  You must have responsible adult to drive you home.  Someone must be with you the first 24 hours after you arrive home.  Reviewed procedure instructions with patient.

## 2024-09-18 ENCOUNTER — Ambulatory Visit (HOSPITAL_COMMUNITY)
Admission: RE | Admit: 2024-09-18 | Discharge: 2024-09-18 | Disposition: A | Discharge status: Home / Self Care | Attending: Cardiology | Admitting: Cardiology

## 2024-09-18 ENCOUNTER — Encounter (HOSPITAL_COMMUNITY)
Admission: RE | Disposition: A | Discharge status: Home / Self Care | Payer: Self-pay | Source: Home / Self Care | Attending: Cardiology

## 2024-09-18 ENCOUNTER — Other Ambulatory Visit: Payer: Self-pay

## 2024-09-18 DIAGNOSIS — I2510 Atherosclerotic heart disease of native coronary artery without angina pectoris: Secondary | ICD-10-CM | POA: Insufficient documentation

## 2024-09-18 DIAGNOSIS — Z7901 Long term (current) use of anticoagulants: Secondary | ICD-10-CM | POA: Insufficient documentation

## 2024-09-18 DIAGNOSIS — I5032 Chronic diastolic (congestive) heart failure: Secondary | ICD-10-CM | POA: Insufficient documentation

## 2024-09-18 DIAGNOSIS — Z79899 Other long term (current) drug therapy: Secondary | ICD-10-CM | POA: Insufficient documentation

## 2024-09-18 DIAGNOSIS — I495 Sick sinus syndrome: Secondary | ICD-10-CM | POA: Insufficient documentation

## 2024-09-18 DIAGNOSIS — I480 Paroxysmal atrial fibrillation: Secondary | ICD-10-CM | POA: Insufficient documentation

## 2024-09-18 DIAGNOSIS — Z950 Presence of cardiac pacemaker: Secondary | ICD-10-CM | POA: Diagnosis not present

## 2024-09-18 DIAGNOSIS — I25119 Atherosclerotic heart disease of native coronary artery with unspecified angina pectoris: Secondary | ICD-10-CM

## 2024-09-18 DIAGNOSIS — I110 Hypertensive heart disease with heart failure: Secondary | ICD-10-CM | POA: Insufficient documentation

## 2024-09-18 DIAGNOSIS — R079 Chest pain, unspecified: Secondary | ICD-10-CM | POA: Diagnosis present

## 2024-09-18 HISTORY — PX: LEFT HEART CATH AND CORONARY ANGIOGRAPHY: CATH118249

## 2024-09-18 MED ORDER — HYDRALAZINE HCL 20 MG/ML IJ SOLN: 10.0000 mg | INTRAMUSCULAR | Status: DC | PRN

## 2024-09-18 MED ORDER — FREE WATER: 500.0000 mL | Freq: Once | Status: DC

## 2024-09-18 MED ORDER — SODIUM CHLORIDE 0.9% FLUSH: 3.0000 mL | Freq: Two times a day (BID) | INTRAVENOUS | Status: DC

## 2024-09-18 MED ORDER — HEPARIN (PORCINE) IN NACL 1000-0.9 UT/500ML-% IV SOLN
INTRAVENOUS | Status: DC | PRN
  Administered 2024-09-18: 500 mL

## 2024-09-18 MED ORDER — ONDANSETRON HCL 4 MG/2ML IJ SOLN: 4.0000 mg | Freq: Four times a day (QID) | INTRAMUSCULAR | Status: DC | PRN

## 2024-09-18 MED ORDER — SODIUM CHLORIDE 0.9 % IV SOLN: INTRAVENOUS | Status: DC | PRN

## 2024-09-18 MED ORDER — VERAPAMIL HCL 2.5 MG/ML IV SOLN
INTRAVENOUS | Status: DC | PRN
  Administered 2024-09-18: 10 mL via INTRA_ARTERIAL

## 2024-09-18 MED ORDER — HEPARIN SODIUM (PORCINE) 1000 UNIT/ML IJ SOLN
INTRAMUSCULAR | Status: DC | PRN
  Administered 2024-09-18: 6000 [IU] via INTRAVENOUS

## 2024-09-18 MED ORDER — LIDOCAINE HCL (PF) 1 % IJ SOLN
INTRAMUSCULAR | Status: AC
  Filled 2024-09-18: qty 30

## 2024-09-18 MED ORDER — MIDAZOLAM HCL (PF) 2 MG/2ML IJ SOLN
INTRAMUSCULAR | Status: DC | PRN
  Administered 2024-09-18: 2 mg via INTRAVENOUS

## 2024-09-18 MED ORDER — ISOSORBIDE MONONITRATE ER 30 MG PO TB24: 30.0000 mg | ORAL_TABLET | Freq: Every day | ORAL | 11 refills | Status: DC

## 2024-09-18 MED ORDER — FENTANYL CITRATE (PF) 100 MCG/2ML IJ SOLN
INTRAMUSCULAR | Status: AC
  Filled 2024-09-18: qty 2

## 2024-09-18 MED ORDER — LIDOCAINE HCL (PF) 1 % IJ SOLN
INTRAMUSCULAR | Status: DC | PRN
  Administered 2024-09-18: 2 mL via INTRADERMAL

## 2024-09-18 MED ORDER — SODIUM CHLORIDE 0.9 % IV SOLN: 250.0000 mL | INTRAVENOUS | Status: DC | PRN

## 2024-09-18 MED ORDER — IOHEXOL 350 MG/ML SOLN
INTRAVENOUS | Status: DC | PRN
  Administered 2024-09-18: 66 mL

## 2024-09-18 MED ORDER — FENTANYL CITRATE (PF) 100 MCG/2ML IJ SOLN
INTRAMUSCULAR | Status: DC | PRN
  Administered 2024-09-18: 25 ug via INTRAVENOUS

## 2024-09-18 MED ORDER — ACETAMINOPHEN 325 MG PO TABS: 650.0000 mg | ORAL_TABLET | ORAL | Status: DC | PRN

## 2024-09-18 MED ORDER — SODIUM CHLORIDE 0.9% FLUSH: 3.0000 mL | INTRAVENOUS | Status: DC | PRN

## 2024-09-18 MED ORDER — ASPIRIN 81 MG PO CHEW: 81.0000 mg | CHEWABLE_TABLET | ORAL | Status: DC

## 2024-09-18 MED ORDER — MIDAZOLAM HCL 2 MG/2ML IJ SOLN
INTRAMUSCULAR | Status: AC
  Filled 2024-09-18: qty 2

## 2024-09-18 MED ORDER — VERAPAMIL HCL 2.5 MG/ML IV SOLN
INTRAVENOUS | Status: AC
  Filled 2024-09-18: qty 2

## 2024-09-18 NOTE — Interval H&P Note (Signed)
 History and Physical Interval Note:  09/18/2024 9:06 AM  [name]  has presented today for surgery, with the diagnosis of CAD - ANGINA.  The various methods of treatment have been discussed with the patient and family. After consideration of risks, benefits and other options for treatment, the patient has consented to  Procedures: LEFT HEART CATH AND CORONARY ANGIOGRAPHY (N/A) as a surgical intervention.  The patient's history has been reviewed, patient examined, no change in status, stable for surgery.  I have reviewed the patient's chart and labs.  Questions were answered to the patient's satisfaction.   Cath Lab Visit (complete for each Cath Lab visit)  Clinical Evaluation Leading to the Procedure:   ACS: No.  Non-ACS:    Anginal Classification: CCS I  Anti-ischemic medical therapy: Minimal Therapy (1 class of medications)  Non-Invasive Test Results: Intermediate-risk stress test findings: cardiac mortality 1-3%/year  Prior CABG: No previous CABG        [facility] 09/18/2024 9:06 AM

## 2024-09-18 NOTE — Discharge Instructions (Addendum)
May resume Eliquis this evening.

## 2024-09-19 ENCOUNTER — Encounter (HOSPITAL_COMMUNITY): Payer: Self-pay | Admitting: Cardiology

## 2024-09-20 ENCOUNTER — Ambulatory Visit (HOSPITAL_COMMUNITY)
Admission: RE | Admit: 2024-09-20 | Discharge: 2024-09-20 | Disposition: A | Discharge status: Home / Self Care | Source: Ambulatory Visit | Attending: Cardiology | Admitting: Cardiology

## 2024-09-20 DIAGNOSIS — R072 Precordial pain: Secondary | ICD-10-CM | POA: Diagnosis present

## 2024-09-20 DIAGNOSIS — R079 Chest pain, unspecified: Secondary | ICD-10-CM

## 2024-09-20 LAB — ECHOCARDIOGRAM COMPLETE
Area-P 1/2: 3.65 cm2
S' Lateral: 4.1 cm

## 2024-09-21 NOTE — Progress Notes (Deleted)
 " Cardiology Office Note   Date:  09/21/2024  ID:  [name], DOB [birth year], MRN [id number] PCP: Center, [facility]  [facility] Providers Cardiologist:  Redell Shallow, MD Electrophysiologist:  Will Gladis Norton, MD    History of Present Illness [name] is a 63 y.o. male with a past medical history of CAD, PAF, HFimpEF, SSS s/p PPM, history of PE, HLD, HTN, grave's disease, OSA, depression, GERD, spinal stenosis. Presents today for follow up post cath   Patient previously had PPM implanted in 2012. Echo in 03/2016 showed EF 40-45%. Left heart cath in 05/2016 showed nonobstructive CAD. Underwent afib ablation in 05/2017.   Echocardiogram in 10/2020 showed that EF had improved to 60-65%, no wall motion abnormalities, grade I DD, normal RV systolic function.   Patient underwent coronary CTA 08/15/24 showed a coronary calcium  score of 825 AU (97th percentile), nonobstructive disease in the LAD, at least moderate stenosis in proximal OM1. Proximal RCA stenosis appeared severe. He was set up for cath 09/18/24 and was found to have moderate single vessel CAD involving the proximal RCA. Recommended medical therapy. If he continued to have angina, could consider PCI of the RCA. 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   CAD  - Most recent cath from 09/18/24 showed moderate single vessel CAD involving the proximal RCA. Recommended medical management. If patient continued to have angina despite optimal medical therapy, could consider PCI of the RCA  -  - Continue lipitor 40 mg daily  - Continue carvedilol  25 mg BID - Add imdur  ?***  - Not on aspirin  due to eliquis  use   PAF  - Previously underwent ablation in 2018 - Maintaining NSR on dofetilide . Qtc OK on EKG from 09/08/24  -  - continue dofetilide  500 mcg BID  - Continue eliquis  5 mg BID   SSS s/p PPM  - Followed  by EP   HFimpEF  HTN  - EF previously 40-45% in 2017. Most recent echocardiogram from 09/20/24 showed EF 50-55%, grade II DD, normal RV systolic function - Continue carvedilol  25 mg BID, olmesartan  40 mg daily  - K 4.3, creatinine 0.98 on 09/08/24   HLD  -  - Continue lipitor 40 mg daily   ? Aortic dilation  - coronary CTA from 07/2024 showed that the included aorta was normal in caliber. 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 Shallow to review images, ask if further workup needed. ***   ROS: ***  Studies Reviewed      *** Risk Assessment/Calculations {Does this patient have ATRIAL FIBRILLATION?:[phone]} No BP recorded.  {Refresh Note OR Click here to enter BP  :1}***       Physical Exam VS:  There were no vitals taken for this visit.       Wt Readings from Last 3 Encounters:  09/18/24 282 lb (127.9 kg)  09/08/24 283 lb (128.4 kg)  09/07/24 283 lb (128.4 kg)    GEN: Well nourished, well developed in no acute distress NECK: No JVD; No carotid bruits CARDIAC: ***RRR, no murmurs, rubs, gallops RESPIRATORY:  Clear to auscultation without rales, wheezing or rhonchi  ABDOMEN: Soft, non-tender, non-distended EXTREMITIES:  No edema; No deformity   ASSESSMENT AND PLAN ***    {Are you ordering a CV Procedure (e.g.  stress test, cath, DCCV, TEE, etc)?   Press F2        :[id number]}  Dispo: ***  Signed, Rollo FABIENE Louder, PA-C   "

## 2024-09-25 ENCOUNTER — Inpatient Hospital Stay: Admission: RE | Admit: 2024-09-25 | Source: Ambulatory Visit

## 2024-09-26 ENCOUNTER — Ambulatory Visit (HOSPITAL_COMMUNITY): Admission: RE | Admit: 2024-09-26 | Source: Ambulatory Visit

## 2024-09-26 NOTE — Progress Notes (Unsigned)
 " Cardiology Office Note:  .   Date:  09/26/2024  ID:  [name], DOB Jul 03, 1961, MRN [id number] PCP: [facility] Providers Cardiologist:  Redell Shallow, MD Electrophysiologist:  Will Gladis Norton, MD {  History of Present Illness: .   [name] is a 64 y.o. male w/PMHx of  PE, HTN, HLD, hyperthyroid (Grave's), OSA w/CPAP PE NICM NSTEMI (Catheterization was done in 2006 in 2009, 2017, 2026  showing nonobstructive disease),  sinus node dysfunction w/PPM (A lead only) AFib  PPM gen change 06/27/24  Saw Glendia Ferrier PA on 09/08/24, recent c/o CP with coronary CTa with concerns of significant stenosis prox OM1, planned for cath  Subsequent cath noted non-obstructive disease (report below)  Today's visit is scheduled as his 90 day post gen change visit ROS:   *** site *** chronic outputs *** AF burden *** Tikosyn  EKG, labs, meds *** eliquis , dose, bleeding *** symptoms   Device information MDT single chamber (ATRIAL LEAD) PPM implanted 2009,  gen change 06/27/24  Arrhythmia/AAD hx Tikosyn  started 2010 AFib ablation 01/17/2011, 05/06/2017  Studies Reviewed: SABRA    EKG done today and reviewed by myself ***  DEVICE interrogation done today and reviewed by myself *** Battery and lead measurements are good ***  09/20/2024: TTE 1. Left ventricular ejection fraction, by estimation, is 50 to 55%. The  left ventricle has low normal function. The left ventricle demonstrates  global hypokinesis. The left ventricular internal cavity size was mildly  dilated. Left ventricular diastolic  parameters are consistent with Grade II diastolic dysfunction  (pseudonormalization).   2. Right ventricular systolic function is normal. The right ventricular  size is mildly enlarged. Tricuspid regurgitation signal is inadequate for  assessing PA pressure.   3. Left atrial size was mildly dilated.   4. Right atrial size was mildly dilated.    5. The mitral valve is normal in structure. Trivial mitral valve  regurgitation. No evidence of mitral stenosis.   6. The aortic valve is tricuspid. Aortic valve regurgitation is not  visualized. No aortic stenosis is present.   7. Aortic dilatation noted. There is severe dilatation of the ascending  aorta, measuring 47 mm.   8. The inferior vena cava is normal in size with greater than 50%  respiratory variability, suggesting right atrial pressure of 3 mmHg.   09/18/2024: LHC   Prox RCA lesion is 75% stenosed.   Prox RCA to Mid RCA lesion is 50% stenosed.   Ost Cx to Prox Cx lesion is 30% stenosed.   Prox LAD lesion is 25% stenosed.   The left ventricular systolic function is normal.   LV end diastolic pressure is mildly elevated.   The left ventricular ejection fraction is 55-65% by visual estimate.   Moderate single vessel CAD involving the proximal RCA Normal LV function Elevated LVEDP 24 mm Hg  05/06/2017: EPS/ablation CONCLUSIONS: 1. Sinus rhythm upon presentation.   2. Intracardiac echo reveals a moderately enlarged left atrium with four separate pulmonary veins without evidence of pulmonary vein stenosis. 3. The left superior and inferior pulmonary veins were quiescent from a prior ablation procedure.  These veins did not require re-isolation.  Minimal ablation was performed along the carina between the LIPV and LSPV.   4. Prodigious electrical activity within the right superior pulmonary vein.  Ectopic atrial tachycardia and premature atrial contractions were frequently noted to arise from this vein.  There was minimal conduction within the right inferior pulmonary vein. 5.  Successful electrical re-isolation and anatomical encircling of the right superior pulmonary vein using radiofrequency current.   A WACA approach was used around the RSPV and RIPV.   6. Premature atrial contractions and nonsustained ectopic atrial tachycardia terminated with isolation of the RSPV which was felt  to be the culpril for his arrhythmias today. 7. CTI ablation performed with complete bidirectional isthmus block achieved 8. No inducible arrhythmias post ablation with isuprel  and adenosine  infused 9. No early apparent complications  05/05/2017: TEE Study Conclusions  - Left ventricle: The cavity size was mildly dilated. Wall    thickness was normal. Systolic function was moderately reduced.    The estimated ejection fraction was in the range of 35% to 40%.    Diffuse hypokinesis.  - Aortic valve: There was no stenosis. There was trivial    regurgitation.  - Aorta: Normal caliber thoracic aorta with mild plaque.  - Mitral valve: There was trivial regurgitation.  - Left atrium: The atrium was moderately dilated. No evidence of    thrombus in the atrial cavity or appendage. No evidence of    thrombus in the atrial cavity or appendage.  - Right ventricle: The cavity size was mildly dilated. Systolic    function was mildly reduced.  - Right atrium: No evidence of thrombus in the atrial cavity or    appendage.  - Atrial septum: No defect or patent foramen ovale was identified.   Impressions:  - No LA thrombus, ok for ablation.        05/08/2016: LHC Prox LAD to Mid LAD lesion, 25 %stenosed. Prox Cx to Mid Cx lesion, 30 %stenosed. Mid RCA lesion, 30 %stenosed. Acute Mrg lesion, 60 %stenosed. There is mild to moderate left ventricular systolic dysfunction. LV end diastolic pressure is normal. The left ventricular ejection fraction is 45-50% by visual estimate.   1. Nonobstructive CAD 2. Mild to moderate LV dysfunction. EF 45% 3. Normal LV filling pressures.   Plan: medical management.        01/17/2011: EPS/Ablation CONCLUSIONS: 1. Paroxysmal atrial fibrillation upon presentation. 2. The patient was noted to have a very large right superior pulmonary     vein as well as a large right inferior pulmonary vein.  The left     superior and left inferior pulmonary veins were  moderate in size.     There was a left middle as well as a right middle pulmonary vein     which arose from the left superior pulmonary vein and right     inferior pulmonary vein respectively.  The patient was noted to     have prodigious conduction from the right superior pulmonary vein     which was felt to be his trigger for atrial fibrillation. 3. Successful electrical isolation and anatomical encircling of all 4     pulmonary veins using radiofrequency current. 4. No inducible arrhythmias with isoproterenol  infusion following the     ablation up to 10 mcg per minute. 5. Pacemaker interrogation following ablation reveals stable lead     measurements. 6. No early apparent complications.   Risk Assessment/Calculations:    Physical Exam:   VS:  There were no vitals taken for this visit.   Wt Readings from Last 3 Encounters:  09/18/24 282 lb (127.9 kg)  09/08/24 283 lb (128.4 kg)  09/07/24 283 lb (128.4 kg)    GEN: Well nourished, well developed in no acute distress NECK: No JVD; No carotid bruits CARDIAC: ***RRR, no murmurs, rubs, gallops RESPIRATORY:  ***  CTA b/l without rales, wheezing or rhonchi  ABDOMEN: Soft, non-tender, non-distended EXTREMITIES: *** No edema; No deformity   PPM site: *** is stable, no thinning, fluctuation, tethering  ASSESSMENT AND PLAN: .    1. PPM     *** Intact function, no programming changes made   2. Paroxysmal Afib     CHA2DS2Vasc is 3, on eliquis , *** appropriately dosed     *** Tikosyn , *** QTc stable      *** % burden   3. HTN     *** Looks good   4. NICM     Recovered to 50-55% by echo Jan 2026     No symptoms or exam findings of volume OL     Update his echo  5.  Secondary hypercoagulable state 2/2 AFib     {Are you ordering a CV Procedure (e.g. stress test, cath, DCCV, TEE, etc)?   Press F2        :[id number]}     Dispo: ***  Signed, Charlies Macario Arthur, PA-C   "

## 2024-09-27 ENCOUNTER — Encounter: Payer: Self-pay | Admitting: Cardiology

## 2024-09-27 ENCOUNTER — Ambulatory Visit: Payer: Self-pay | Attending: Cardiovascular Disease | Admitting: Cardiology

## 2024-09-27 VITALS — BP 124/80 | HR 77 | Ht 71.0 in | Wt 282.0 lb

## 2024-09-27 DIAGNOSIS — I495 Sick sinus syndrome: Secondary | ICD-10-CM | POA: Insufficient documentation

## 2024-09-27 DIAGNOSIS — I10 Essential (primary) hypertension: Secondary | ICD-10-CM | POA: Diagnosis not present

## 2024-09-27 DIAGNOSIS — I25119 Atherosclerotic heart disease of native coronary artery with unspecified angina pectoris: Secondary | ICD-10-CM | POA: Diagnosis not present

## 2024-09-27 DIAGNOSIS — I480 Paroxysmal atrial fibrillation: Secondary | ICD-10-CM | POA: Diagnosis not present

## 2024-09-27 DIAGNOSIS — I5020 Unspecified systolic (congestive) heart failure: Secondary | ICD-10-CM | POA: Insufficient documentation

## 2024-09-27 LAB — CUP PACEART INCLINIC DEVICE CHECK
Battery Remaining Longevity: 187 mo
Battery Voltage: 3.22 V
Brady Statistic RV Percent Paced: 3.32 %
Date Time Interrogation Session: 20260128162345
Implantable Lead Connection Status: 753985
Implantable Lead Implant Date: 20091023
Implantable Lead Location: 753859
Implantable Lead Model: 5076
Implantable Pulse Generator Implant Date: 20251028
Lead Channel Impedance Value: 323 Ohm
Lead Channel Impedance Value: 418 Ohm
Lead Channel Pacing Threshold Amplitude: 0.75 V
Lead Channel Pacing Threshold Pulse Width: 0.4 ms
Lead Channel Sensing Intrinsic Amplitude: 1.625 mV
Lead Channel Sensing Intrinsic Amplitude: 1.75 mV
Lead Channel Setting Pacing Amplitude: 2 V
Lead Channel Setting Pacing Pulse Width: 0.4 ms
Lead Channel Setting Sensing Sensitivity: 0.45 mV
Zone Setting Status: 755011

## 2024-09-27 NOTE — Progress Notes (Signed)
 " Electrophysiology Office Note:   ID:  [name], [name] 1961/01/25, MRN [id number]  Primary Cardiologist: Redell Shallow, MD Electrophysiologist: Will Gladis Norton, MD      History of Present Illness:   [name] is a 64 y.o. male with h/o SSS / Tachy-Brady s/p PPM, AF, heart failure w/rec EF, coronary artery disease, hyperthyroidism seen today for routine electrophysiology follow-up s/p Pacemaker gen change. Also Tikosyn  management follow up.  Since his generator change out, patient was seen by general cardiology with symptoms of chest pain.  Pain was substernal and sharp in nature.  Per patient it improved with drinking water .  A coronary CTA was arranged.  This CTA showed borderline hemodynamically significant stenosis in proximal OM1 and serial hemodynamically significant RCA stenosis.  Cardiac catheterization was recommended.  Cardiac catheterization completed on 09/18/2024 found moderate single-vessel CAD involving the proximal RCA.  Medical management recommended.  Patient also underwent echocardiogram on 1/21, noted with dilated ascending aorta.  Is now pending CTA thoracic aorta for further evaluation.    Today patient tells me that since his coronary CTA he has not had any chest discomfort.  He feels strongly that his chest discomfort was indigestion and acid reflux rather than cardiac.  He does have a prescription for nitroglycerin  but has not needed to take any.    Regarding his generator change out, the patient some tenderness around device pocket but says this is improving.  He has not seen any redness or swelling.  Today patient denies chest pain, palpitations, dyspnea, PND, orthopnea, nausea, vomiting, dizziness, syncope, edema, weight gain, or early satiety.    Review of systems complete and found to be negative unless listed in HPI.   EP Information / Studies Reviewed:    EKG is not ordered today. EKG from 09/08/2024 reviewed which showed sinus bradycardia with  left axis deviation, LVH with borderline wide QRS.  QT C stable when compared to prior tracings at approximately 443 ms.       PPM Interrogation-  reviewed in detail today,  See PACEART report.  Arrhythmia/Device History MDT dual chamber PPM implanted 2009, A lead only, RV port is plugged  S/p PVI 2012 S/p PVI/CTI 2018   Physical Exam:   VS:  BP 124/80   Pulse 77   Ht 5' 11 (1.803 m)   Wt 282 lb (127.9 kg)   SpO2 96%   BMI 39.33 kg/m    Wt Readings from Last 3 Encounters:  09/27/24 282 lb (127.9 kg)  09/18/24 282 lb (127.9 kg)  09/08/24 283 lb (128.4 kg)     GEN: No acute distress  NECK: No JVD; No carotid bruits CARDIAC: Regular rate and rhythm, no murmurs, rubs, gallops RESPIRATORY:  Clear to auscultation without rales, wheezing or rhonchi  ABDOMEN: Soft, non-tender, non-distended EXTREMITIES:  No edema; No deformity   ASSESSMENT AND PLAN:    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  Following recent generator change out on 10/28, patient now has a single-chamber pacemaker with atrial lead. Normal PPM function 4 high rate episodes, longest 2 seconds.  See Elisabeth Art report No changes today  Paroxysmal atrial fibrillation High risk medication monitoring Secondary hypercoagulable state Patient remains on Tikosyn  500 mcg twice daily with stable QTc on recent 09/08/2024 EKG as well as December 2026 EKG.  Continue Eliquis  5 mg twice daily.  Will check basic metabolic panel, magnesium, complete blood  count today.  Coronary artery disease Patient status post left heart catheterization on 09/18/2024.  This found moderate single-vessel CAD involving the proximal RCA.  Today patient denies any chest pain over the past several weeks.  Has not needed to take nitroglycerin .  He is pending follow-up with general cardiology next week.  Hypertension Blood pressure is  well-controlled on current antihypertensive regimen. Continue current medications and dosing.   Heart failure with improved ejection fraction Patient previously with an LVEF of 35 to 40% noted to have an EF of 50 to 55% on most recent echocardiogram completed 09/20/2024.  NYHA class II.  Patient euvolemic on exam today.  Continue with currently prescribed guideline directed medical therapy.  Dilated ascending aorta Recent echocardiogram noted ascending aorta dilated to 4.7 cm.  Patient is pending CTA of thoracic aorta.      Disposition:   Follow up with EP Team 3 to 4 months for Tikosyn  management.   Signed, Artist Pouch, PA-C  "

## 2024-09-27 NOTE — Patient Instructions (Addendum)
 Medication Instructions:   Your physician recommends that you continue on your current medications as directed. Please refer to the Current Medication list given to you today.   *If you need a refill on your cardiac medications before your next appointment, please call your pharmacy*    Lab Work:  PLEASE GO DOWN STAIRS  [facility]  FIRST FLOOR   ( GET OFF ELEVATORS WALK TOWARDS WAITING AREA LAB LOCATED BY PHARMACY):  BMET  MAG AND CBC     If you have labs (blood work) drawn today and your tests are completely normal, you will receive your results only by: MyChart Message (if you have MyChart) OR A paper copy in the mail If you have any lab test that is abnormal or we need to change your treatment, we will call you to review the results.    Follow-Up: At [facility], you and your health needs are our priority.  As part of our continuing mission to provide you with exceptional heart care, our providers are all part of one team.  This team includes your primary Cardiologist (physician) and Advanced Practice Providers or APPs (Physician Assistants and Nurse Practitioners) who all work together to provide you with the care you need, when you need it.   Your next appointment:    4 month(s)    Provider:    You will see one of the following Advanced Practice Providers on your designated Care Team:   Charlies Arthur, PA-C Michael Andy Tillery, PA-C Brandi Ollis, NP Artist Pouch, PA-C  [place], PA-C    We recommend signing up for the patient portal called MyChart.  Sign up information is provided on this After Visit Summary.  MyChart is used to connect with patients for Virtual Visits (Telemedicine).  Patients are able to view lab/test results, encounter notes, upcoming appointments, etc.  Non-urgent messages can be sent to your provider as well.   To learn more about what you can do with MyChart, go to [email or web address].   Other Instructions

## 2024-09-28 ENCOUNTER — Ambulatory Visit: Payer: Self-pay | Admitting: Cardiology

## 2024-09-28 LAB — MAGNESIUM: Magnesium: 2 mg/dL (ref 1.6–2.3)

## 2024-09-28 LAB — CBC
Hematocrit: 45.5 % (ref 37.5–51.0)
Hemoglobin: 14.7 g/dL (ref 13.0–17.7)
MCH: 27.2 pg (ref 26.6–33.0)
MCHC: 32.3 g/dL (ref 31.5–35.7)
MCV: 84 fL (ref 79–97)
Platelets: 261 10*3/uL (ref 150–450)
RBC: 5.41 x10E6/uL (ref 4.14–5.80)
RDW: 16.5 % — ABNORMAL HIGH (ref 11.6–15.4)
WBC: 5.1 10*3/uL (ref 3.4–10.8)

## 2024-09-28 LAB — BASIC METABOLIC PANEL WITH GFR
BUN/Creatinine Ratio: 19 (ref 10–24)
BUN: 19 mg/dL (ref 8–27)
CO2: 23 mmol/L (ref 20–29)
Calcium: 9.4 mg/dL (ref 8.6–10.2)
Chloride: 103 mmol/L (ref 96–106)
Creatinine, Ser: 0.99 mg/dL (ref 0.76–1.27)
Glucose: 103 mg/dL — AB (ref 70–99)
Potassium: 4.7 mmol/L (ref 3.5–5.2)
Sodium: 139 mmol/L (ref 134–144)
eGFR: 85 mL/min/{1.73_m2}

## 2024-10-03 ENCOUNTER — Encounter: Payer: Self-pay | Admitting: Cardiology

## 2024-10-03 ENCOUNTER — Ambulatory Visit

## 2024-10-03 VITALS — BP 142/84 | HR 68 | Resp 14 | Ht 71.0 in | Wt 283.8 lb

## 2024-10-03 DIAGNOSIS — E782 Mixed hyperlipidemia: Secondary | ICD-10-CM

## 2024-10-03 DIAGNOSIS — I10 Essential (primary) hypertension: Secondary | ICD-10-CM | POA: Diagnosis not present

## 2024-10-03 DIAGNOSIS — I2510 Atherosclerotic heart disease of native coronary artery without angina pectoris: Secondary | ICD-10-CM | POA: Diagnosis not present

## 2024-10-03 DIAGNOSIS — I77810 Thoracic aortic ectasia: Secondary | ICD-10-CM

## 2024-10-03 DIAGNOSIS — I480 Paroxysmal atrial fibrillation: Secondary | ICD-10-CM | POA: Diagnosis not present

## 2024-10-03 DIAGNOSIS — I5020 Unspecified systolic (congestive) heart failure: Secondary | ICD-10-CM | POA: Diagnosis not present

## 2024-10-03 LAB — HEPATIC FUNCTION PANEL
ALT: 31 [IU]/L (ref 0–44)
AST: 33 [IU]/L (ref 0–40)
Albumin: 4.4 g/dL (ref 3.9–4.9)
Alkaline Phosphatase: 99 [IU]/L (ref 47–123)
Bilirubin Total: 0.5 mg/dL (ref 0.0–1.2)
Bilirubin, Direct: 0.19 mg/dL (ref 0.00–0.40)
Total Protein: 6.9 g/dL (ref 6.0–8.5)

## 2024-10-03 LAB — LIPID PANEL
Chol/HDL Ratio: 2.4 ratio (ref 0.0–5.0)
Cholesterol, Total: 175 mg/dL (ref 100–199)
HDL: 72 mg/dL
LDL Chol Calc (NIH): 92 mg/dL (ref 0–99)
Triglycerides: 54 mg/dL (ref 0–149)
VLDL Cholesterol Cal: 11 mg/dL (ref 5–40)

## 2024-10-04 ENCOUNTER — Ambulatory Visit: Payer: Self-pay

## 2024-10-05 MED ORDER — ATORVASTATIN CALCIUM 80 MG PO TABS: 80.0000 mg | ORAL_TABLET | Freq: Every day | ORAL | 3 refills | Status: AC

## 2024-10-05 NOTE — Addendum Note (Signed)
 Addended by: BETHENA POWELL SAUNDERS on: 10/05/2024 04:42 PM   Modules accepted: Orders

## 2024-10-06 ENCOUNTER — Ambulatory Visit (HOSPITAL_COMMUNITY): Admission: RE | Admit: 2024-10-06 | Source: Ambulatory Visit | Attending: Cardiology | Admitting: Cardiology

## 2024-10-06 ENCOUNTER — Other Ambulatory Visit: Payer: Self-pay

## 2024-10-06 DIAGNOSIS — I77810 Thoracic aortic ectasia: Secondary | ICD-10-CM

## 2024-10-06 DIAGNOSIS — E782 Mixed hyperlipidemia: Secondary | ICD-10-CM

## 2024-10-06 DIAGNOSIS — I2510 Atherosclerotic heart disease of native coronary artery without angina pectoris: Secondary | ICD-10-CM

## 2024-10-06 MED ORDER — IOHEXOL 350 MG/ML SOLN
75.0000 mL | Freq: Once | INTRAVENOUS | Status: AC | PRN
  Administered 2024-10-06: 75 mL via INTRAVENOUS

## 2024-10-06 NOTE — Addendum Note (Signed)
 Addended by: NOLA SADDIE RAMAN on: 10/06/2024 06:49 AM   Modules accepted: Orders

## 2024-10-10 ENCOUNTER — Encounter: Admitting: Gastroenterology

## 2024-10-11 ENCOUNTER — Other Ambulatory Visit

## 2024-11-01 ENCOUNTER — Encounter

## 2025-01-25 ENCOUNTER — Ambulatory Visit: Admitting: Cardiology

## 2025-01-31 ENCOUNTER — Encounter

## 2025-05-02 ENCOUNTER — Encounter

## 2025-08-01 ENCOUNTER — Encounter

## 2025-10-31 ENCOUNTER — Encounter
